# Patient Record
Sex: Female | Born: 1935
Health system: Southern US, Community
[De-identification: ages and names within clinical notes are randomized; demographics above are authoritative.]

## PROBLEM LIST (undated history)

## (undated) DIAGNOSIS — Z8719 Personal history of other diseases of the digestive system: Secondary | ICD-10-CM

## (undated) DIAGNOSIS — E039 Hypothyroidism, unspecified: Secondary | ICD-10-CM

## (undated) DIAGNOSIS — I10 Essential (primary) hypertension: Secondary | ICD-10-CM

## (undated) DIAGNOSIS — K862 Cyst of pancreas: Secondary | ICD-10-CM

## (undated) DIAGNOSIS — K219 Gastro-esophageal reflux disease without esophagitis: Secondary | ICD-10-CM

## (undated) DIAGNOSIS — G4700 Insomnia, unspecified: Secondary | ICD-10-CM

## (undated) DIAGNOSIS — R5381 Other malaise: Secondary | ICD-10-CM

## (undated) DIAGNOSIS — F329 Major depressive disorder, single episode, unspecified: Secondary | ICD-10-CM

## (undated) DIAGNOSIS — R350 Frequency of micturition: Secondary | ICD-10-CM

## (undated) DIAGNOSIS — K2980 Duodenitis without bleeding: Secondary | ICD-10-CM

## (undated) DIAGNOSIS — F32A Depression, unspecified: Secondary | ICD-10-CM

## (undated) DIAGNOSIS — M1990 Unspecified osteoarthritis, unspecified site: Secondary | ICD-10-CM

## (undated) DIAGNOSIS — J189 Pneumonia, unspecified organism: Secondary | ICD-10-CM

## (undated) DIAGNOSIS — E43 Unspecified severe protein-calorie malnutrition: Secondary | ICD-10-CM

## (undated) DIAGNOSIS — K863 Pseudocyst of pancreas: Secondary | ICD-10-CM

## (undated) DIAGNOSIS — G2581 Restless legs syndrome: Secondary | ICD-10-CM

## (undated) DIAGNOSIS — Z86711 Personal history of pulmonary embolism: Secondary | ICD-10-CM

## (undated) DIAGNOSIS — G4730 Sleep apnea, unspecified: Secondary | ICD-10-CM

## (undated) DIAGNOSIS — B029 Zoster without complications: Secondary | ICD-10-CM

## (undated) DIAGNOSIS — Q453 Other congenital malformations of pancreas and pancreatic duct: Secondary | ICD-10-CM

## (undated) DIAGNOSIS — E785 Hyperlipidemia, unspecified: Secondary | ICD-10-CM

## (undated) DIAGNOSIS — M353 Polymyalgia rheumatica: Secondary | ICD-10-CM

## (undated) DIAGNOSIS — F419 Anxiety disorder, unspecified: Secondary | ICD-10-CM

## (undated) DIAGNOSIS — K222 Esophageal obstruction: Secondary | ICD-10-CM

## (undated) DIAGNOSIS — R5383 Other fatigue: Secondary | ICD-10-CM

## (undated) DIAGNOSIS — R3915 Urgency of urination: Secondary | ICD-10-CM

## (undated) DIAGNOSIS — I82402 Acute embolism and thrombosis of unspecified deep veins of left lower extremity: Secondary | ICD-10-CM

## (undated) DIAGNOSIS — I82409 Acute embolism and thrombosis of unspecified deep veins of unspecified lower extremity: Secondary | ICD-10-CM

## (undated) DIAGNOSIS — N3281 Overactive bladder: Secondary | ICD-10-CM

## (undated) DIAGNOSIS — I4891 Unspecified atrial fibrillation: Secondary | ICD-10-CM

## (undated) DIAGNOSIS — O2230 Deep phlebothrombosis in pregnancy, unspecified trimester: Secondary | ICD-10-CM

## (undated) DIAGNOSIS — K449 Diaphragmatic hernia without obstruction or gangrene: Secondary | ICD-10-CM

## (undated) DIAGNOSIS — D649 Anemia, unspecified: Secondary | ICD-10-CM

## (undated) DIAGNOSIS — K5090 Crohn's disease, unspecified, without complications: Secondary | ICD-10-CM

## (undated) DIAGNOSIS — K2900 Acute gastritis without bleeding: Secondary | ICD-10-CM

## (undated) DIAGNOSIS — G43909 Migraine, unspecified, not intractable, without status migrainosus: Secondary | ICD-10-CM

## (undated) DIAGNOSIS — E119 Type 2 diabetes mellitus without complications: Secondary | ICD-10-CM

## (undated) DIAGNOSIS — I499 Cardiac arrhythmia, unspecified: Secondary | ICD-10-CM

## (undated) DIAGNOSIS — K5730 Diverticulosis of large intestine without perforation or abscess without bleeding: Secondary | ICD-10-CM

## (undated) HISTORY — DX: Gastro-esophageal reflux disease without esophagitis: K21.9

## (undated) HISTORY — PX: TONSILLECTOMY: SUR1361

## (undated) HISTORY — DX: Cyst of pancreas: K86.2

## (undated) HISTORY — DX: Deep phlebothrombosis in pregnancy, unspecified trimester: O22.30

## (undated) HISTORY — DX: Pneumonia, unspecified organism: J18.9

## (undated) HISTORY — DX: Zoster without complications: B02.9

## (undated) HISTORY — DX: Unspecified osteoarthritis, unspecified site: M19.90

## (undated) HISTORY — DX: Duodenitis without bleeding: K29.80

## (undated) HISTORY — DX: Pseudocyst of pancreas: K86.3

## (undated) HISTORY — DX: Personal history of pulmonary embolism: Z86.711

## (undated) HISTORY — DX: Diverticulosis of large intestine without perforation or abscess without bleeding: K57.30

## (undated) HISTORY — DX: Insomnia, unspecified: G47.00

## (undated) HISTORY — DX: Anemia, unspecified: D64.9

## (undated) HISTORY — PX: CATARACT EXTRACTION, BILATERAL: SHX1313

## (undated) HISTORY — DX: Diaphragmatic hernia without obstruction or gangrene: K44.9

## (undated) HISTORY — DX: Crohn's disease, unspecified, without complications: K50.90

## (undated) HISTORY — DX: Sleep apnea, unspecified: G47.30

## (undated) HISTORY — PX: BACK SURGERY: SHX140

## (undated) HISTORY — PX: ROTATOR CUFF REPAIR: SHX139

## (undated) HISTORY — DX: Esophageal obstruction: K22.2

## (undated) HISTORY — DX: Other fatigue: R53.81

## (undated) HISTORY — DX: Essential (primary) hypertension: I10

## (undated) HISTORY — DX: Polymyalgia rheumatica: M35.3

## (undated) HISTORY — DX: Other fatigue: R53.83

## (undated) HISTORY — DX: Personal history of other diseases of the digestive system: Z87.19

## (undated) HISTORY — DX: Hyperlipidemia, unspecified: E78.5

## (undated) HISTORY — DX: Restless legs syndrome: G25.81

## (undated) HISTORY — DX: Acute gastritis without bleeding: K29.00

---
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
## 1978-12-08 HISTORY — PX: REDUCTION MAMMAPLASTY: SUR839

## 1998-09-10 ENCOUNTER — Ambulatory Visit (HOSPITAL_COMMUNITY): Admission: RE | Admit: 1998-09-10 | Discharge: 1998-09-10 | Payer: Self-pay | Admitting: Endocrinology

## 1999-05-29 ENCOUNTER — Encounter: Payer: Self-pay | Admitting: Internal Medicine

## 1999-05-29 ENCOUNTER — Inpatient Hospital Stay (HOSPITAL_COMMUNITY): Admission: EM | Admit: 1999-05-29 | Discharge: 1999-06-03 | Payer: Self-pay | Admitting: Internal Medicine

## 1999-09-09 ENCOUNTER — Ambulatory Visit (HOSPITAL_COMMUNITY): Admission: RE | Admit: 1999-09-09 | Discharge: 1999-09-09 | Payer: Self-pay | Admitting: Internal Medicine

## 1999-09-09 ENCOUNTER — Encounter: Payer: Self-pay | Admitting: Internal Medicine

## 2000-02-24 ENCOUNTER — Encounter: Admission: RE | Admit: 2000-02-24 | Discharge: 2000-02-24 | Payer: Self-pay | Admitting: Internal Medicine

## 2000-02-24 ENCOUNTER — Encounter: Payer: Self-pay | Admitting: Internal Medicine

## 2000-03-19 ENCOUNTER — Encounter: Payer: Self-pay | Admitting: Orthopedic Surgery

## 2000-03-27 ENCOUNTER — Inpatient Hospital Stay (HOSPITAL_COMMUNITY): Admission: RE | Admit: 2000-03-27 | Discharge: 2000-03-30 | Payer: Self-pay | Admitting: Orthopedic Surgery

## 2000-07-28 ENCOUNTER — Other Ambulatory Visit: Admission: RE | Admit: 2000-07-28 | Discharge: 2000-07-28 | Payer: Self-pay | Admitting: Psychology

## 2000-08-20 ENCOUNTER — Encounter: Payer: Self-pay | Admitting: Endocrinology

## 2000-08-20 ENCOUNTER — Inpatient Hospital Stay (HOSPITAL_COMMUNITY): Admission: EM | Admit: 2000-08-20 | Discharge: 2000-08-23 | Payer: Self-pay | Admitting: Emergency Medicine

## 2000-10-07 ENCOUNTER — Ambulatory Visit (HOSPITAL_COMMUNITY): Admission: RE | Admit: 2000-10-07 | Discharge: 2000-10-07 | Payer: Self-pay

## 2000-11-16 ENCOUNTER — Ambulatory Visit (HOSPITAL_BASED_OUTPATIENT_CLINIC_OR_DEPARTMENT_OTHER): Admission: RE | Admit: 2000-11-16 | Discharge: 2000-11-16 | Payer: Self-pay | Admitting: Pulmonary Disease

## 2001-09-21 ENCOUNTER — Other Ambulatory Visit: Admission: RE | Admit: 2001-09-21 | Discharge: 2001-09-21 | Payer: Self-pay | Admitting: *Deleted

## 2001-10-08 ENCOUNTER — Ambulatory Visit (HOSPITAL_COMMUNITY): Admission: RE | Admit: 2001-10-08 | Discharge: 2001-10-08 | Payer: Self-pay | Admitting: *Deleted

## 2001-10-25 ENCOUNTER — Ambulatory Visit: Admission: RE | Admit: 2001-10-25 | Discharge: 2001-10-25 | Payer: Self-pay | Admitting: *Deleted

## 2001-12-13 ENCOUNTER — Encounter: Admission: RE | Admit: 2001-12-13 | Discharge: 2001-12-13 | Payer: Self-pay | Admitting: *Deleted

## 2001-12-27 ENCOUNTER — Encounter: Admission: RE | Admit: 2001-12-27 | Discharge: 2001-12-27 | Payer: Self-pay | Admitting: *Deleted

## 2002-01-27 ENCOUNTER — Ambulatory Visit (HOSPITAL_COMMUNITY): Admission: RE | Admit: 2002-01-27 | Discharge: 2002-01-27 | Payer: Self-pay | Admitting: *Deleted

## 2002-10-17 ENCOUNTER — Ambulatory Visit (HOSPITAL_COMMUNITY): Admission: RE | Admit: 2002-10-17 | Discharge: 2002-10-17 | Payer: Self-pay | Admitting: *Deleted

## 2002-11-30 ENCOUNTER — Encounter: Payer: Self-pay | Admitting: Internal Medicine

## 2002-11-30 ENCOUNTER — Ambulatory Visit (HOSPITAL_COMMUNITY): Admission: RE | Admit: 2002-11-30 | Discharge: 2002-11-30 | Payer: Self-pay | Admitting: Internal Medicine

## 2003-01-09 ENCOUNTER — Encounter: Payer: Self-pay | Admitting: Internal Medicine

## 2003-01-09 ENCOUNTER — Ambulatory Visit (HOSPITAL_COMMUNITY): Admission: RE | Admit: 2003-01-09 | Discharge: 2003-01-09 | Payer: Self-pay | Admitting: Internal Medicine

## 2003-01-17 ENCOUNTER — Ambulatory Visit (HOSPITAL_COMMUNITY): Admission: RE | Admit: 2003-01-17 | Discharge: 2003-01-17 | Payer: Self-pay | Admitting: Internal Medicine

## 2003-10-11 ENCOUNTER — Encounter: Admission: RE | Admit: 2003-10-11 | Discharge: 2003-10-11 | Payer: Self-pay | Admitting: Internal Medicine

## 2003-10-23 ENCOUNTER — Encounter: Payer: Self-pay | Admitting: Gastroenterology

## 2004-06-04 ENCOUNTER — Emergency Department (HOSPITAL_COMMUNITY): Admission: EM | Admit: 2004-06-04 | Discharge: 2004-06-04 | Payer: Self-pay | Admitting: Emergency Medicine

## 2004-10-25 ENCOUNTER — Ambulatory Visit: Payer: Self-pay | Admitting: Pulmonary Disease

## 2004-10-29 ENCOUNTER — Ambulatory Visit (HOSPITAL_COMMUNITY): Admission: RE | Admit: 2004-10-29 | Discharge: 2004-10-29 | Payer: Self-pay | Admitting: Pulmonary Disease

## 2004-11-04 ENCOUNTER — Ambulatory Visit: Payer: Self-pay | Admitting: Pulmonary Disease

## 2004-12-08 HISTORY — PX: SHOULDER OPEN ROTATOR CUFF REPAIR: SHX2407

## 2005-01-30 ENCOUNTER — Inpatient Hospital Stay (HOSPITAL_COMMUNITY): Admission: RE | Admit: 2005-01-30 | Discharge: 2005-01-31 | Payer: Self-pay | Admitting: Orthopedic Surgery

## 2005-02-24 ENCOUNTER — Ambulatory Visit: Payer: Self-pay | Admitting: Internal Medicine

## 2005-02-27 ENCOUNTER — Ambulatory Visit: Payer: Self-pay | Admitting: Internal Medicine

## 2005-02-28 ENCOUNTER — Inpatient Hospital Stay (HOSPITAL_COMMUNITY): Admission: EM | Admit: 2005-02-28 | Discharge: 2005-03-01 | Payer: Self-pay | Admitting: Emergency Medicine

## 2005-04-14 ENCOUNTER — Ambulatory Visit: Payer: Self-pay | Admitting: Internal Medicine

## 2005-04-17 ENCOUNTER — Ambulatory Visit: Payer: Self-pay | Admitting: Pulmonary Disease

## 2005-04-17 ENCOUNTER — Ambulatory Visit: Payer: Self-pay | Admitting: Internal Medicine

## 2005-04-24 ENCOUNTER — Ambulatory Visit (HOSPITAL_COMMUNITY): Admission: RE | Admit: 2005-04-24 | Discharge: 2005-04-24 | Payer: Self-pay | Admitting: Internal Medicine

## 2005-05-20 ENCOUNTER — Ambulatory Visit: Payer: Self-pay | Admitting: Internal Medicine

## 2005-06-16 ENCOUNTER — Ambulatory Visit (HOSPITAL_COMMUNITY): Admission: RE | Admit: 2005-06-16 | Discharge: 2005-06-16 | Payer: Self-pay | Admitting: Internal Medicine

## 2005-06-16 ENCOUNTER — Ambulatory Visit: Payer: Self-pay | Admitting: Internal Medicine

## 2005-06-16 ENCOUNTER — Observation Stay (HOSPITAL_COMMUNITY): Admission: EM | Admit: 2005-06-16 | Discharge: 2005-06-17 | Payer: Self-pay | Admitting: Internal Medicine

## 2005-07-09 ENCOUNTER — Ambulatory Visit: Payer: Self-pay | Admitting: Internal Medicine

## 2005-10-16 ENCOUNTER — Emergency Department (HOSPITAL_COMMUNITY): Admission: EM | Admit: 2005-10-16 | Discharge: 2005-10-16 | Payer: Self-pay | Admitting: Emergency Medicine

## 2005-10-22 ENCOUNTER — Encounter: Admission: RE | Admit: 2005-10-22 | Discharge: 2005-10-22 | Payer: Self-pay | Admitting: Internal Medicine

## 2006-03-05 ENCOUNTER — Ambulatory Visit: Payer: Self-pay | Admitting: Internal Medicine

## 2006-03-06 ENCOUNTER — Emergency Department (HOSPITAL_COMMUNITY): Admission: EM | Admit: 2006-03-06 | Discharge: 2006-03-06 | Payer: Self-pay | Admitting: Emergency Medicine

## 2006-03-12 ENCOUNTER — Ambulatory Visit (HOSPITAL_COMMUNITY): Admission: RE | Admit: 2006-03-12 | Discharge: 2006-03-12 | Payer: Self-pay | Admitting: General Surgery

## 2006-03-16 ENCOUNTER — Ambulatory Visit: Payer: Self-pay | Admitting: Internal Medicine

## 2006-03-31 ENCOUNTER — Ambulatory Visit (HOSPITAL_COMMUNITY): Admission: RE | Admit: 2006-03-31 | Discharge: 2006-03-31 | Payer: Self-pay | Admitting: Internal Medicine

## 2006-04-08 ENCOUNTER — Ambulatory Visit: Payer: Self-pay | Admitting: Internal Medicine

## 2006-04-10 ENCOUNTER — Ambulatory Visit (HOSPITAL_COMMUNITY): Admission: RE | Admit: 2006-04-10 | Discharge: 2006-04-10 | Payer: Self-pay | Admitting: Internal Medicine

## 2006-04-21 ENCOUNTER — Ambulatory Visit: Payer: Self-pay | Admitting: Internal Medicine

## 2006-04-30 ENCOUNTER — Encounter (INDEPENDENT_AMBULATORY_CARE_PROVIDER_SITE_OTHER): Payer: Self-pay | Admitting: Specialist

## 2006-04-30 ENCOUNTER — Ambulatory Visit (HOSPITAL_COMMUNITY): Admission: RE | Admit: 2006-04-30 | Discharge: 2006-04-30 | Payer: Self-pay | Admitting: Gastroenterology

## 2006-04-30 ENCOUNTER — Encounter: Payer: Self-pay | Admitting: Gastroenterology

## 2006-05-06 ENCOUNTER — Ambulatory Visit: Payer: Self-pay | Admitting: Gastroenterology

## 2006-06-02 ENCOUNTER — Ambulatory Visit: Payer: Self-pay | Admitting: Internal Medicine

## 2006-07-09 ENCOUNTER — Ambulatory Visit: Payer: Self-pay | Admitting: Internal Medicine

## 2006-10-26 ENCOUNTER — Ambulatory Visit: Payer: Self-pay | Admitting: Internal Medicine

## 2006-11-13 ENCOUNTER — Ambulatory Visit: Payer: Self-pay | Admitting: Pulmonary Disease

## 2007-01-08 ENCOUNTER — Ambulatory Visit (HOSPITAL_BASED_OUTPATIENT_CLINIC_OR_DEPARTMENT_OTHER): Admission: RE | Admit: 2007-01-08 | Discharge: 2007-01-08 | Payer: Self-pay | Admitting: Pulmonary Disease

## 2007-01-12 ENCOUNTER — Ambulatory Visit: Payer: Self-pay | Admitting: Pulmonary Disease

## 2007-02-05 ENCOUNTER — Ambulatory Visit: Payer: Self-pay | Admitting: Pulmonary Disease

## 2007-02-05 LAB — CONVERTED CEMR LAB
Basophils Absolute: 0 10*3/uL (ref 0.0–0.1)
CO2: 28 meq/L (ref 19–32)
Chloride: 101 meq/L (ref 96–112)
Creatinine, Ser: 0.9 mg/dL (ref 0.4–1.2)
GFR calc Af Amer: 77 mL/min
Glucose, Bld: 111 mg/dL — ABNORMAL HIGH (ref 70–99)
HCT: 34.3 % — ABNORMAL LOW (ref 36.0–46.0)
Hemoglobin: 11.6 g/dL — ABNORMAL LOW (ref 12.0–15.0)
MCHC: 33.9 g/dL (ref 30.0–36.0)
Monocytes Absolute: 0.3 10*3/uL (ref 0.2–0.7)
Neutrophils Relative %: 42 % — ABNORMAL LOW (ref 43.0–77.0)
RDW: 12.6 % (ref 11.5–14.6)
Saturation Ratios: 22.8 % (ref 20.0–50.0)
Sodium: 136 meq/L (ref 135–145)

## 2007-06-03 ENCOUNTER — Emergency Department (HOSPITAL_COMMUNITY): Admission: EM | Admit: 2007-06-03 | Discharge: 2007-06-03 | Payer: Self-pay | Admitting: Emergency Medicine

## 2007-07-12 ENCOUNTER — Ambulatory Visit: Payer: Self-pay | Admitting: Internal Medicine

## 2007-07-12 LAB — CONVERTED CEMR LAB
Basophils Relative: 0.4 % (ref 0.0–1.0)
Bilirubin, Direct: 0.1 mg/dL (ref 0.0–0.3)
CO2: 30 meq/L (ref 19–32)
Creatinine, Ser: 0.7 mg/dL (ref 0.4–1.2)
Eosinophils Relative: 0.9 % (ref 0.0–5.0)
GFR calc Af Amer: 102 mL/min
Glucose, Bld: 122 mg/dL — ABNORMAL HIGH (ref 70–99)
HCT: 36.2 % (ref 36.0–46.0)
Hemoglobin: 12.6 g/dL (ref 12.0–15.0)
Lipase: 17 units/L (ref 11.0–59.0)
Lymphocytes Relative: 31.9 % (ref 12.0–46.0)
Monocytes Absolute: 0.5 10*3/uL (ref 0.2–0.7)
Neutro Abs: 4.8 10*3/uL (ref 1.4–7.7)
Neutrophils Relative %: 60.3 % (ref 43.0–77.0)
Potassium: 4.8 meq/L (ref 3.5–5.1)
Total Bilirubin: 0.6 mg/dL (ref 0.3–1.2)
Total Protein: 6.6 g/dL (ref 6.0–8.3)
WBC: 8 10*3/uL (ref 4.5–10.5)

## 2007-07-14 ENCOUNTER — Ambulatory Visit: Payer: Self-pay | Admitting: Cardiology

## 2007-08-02 ENCOUNTER — Ambulatory Visit: Payer: Self-pay | Admitting: Internal Medicine

## 2008-03-09 DIAGNOSIS — G4730 Sleep apnea, unspecified: Secondary | ICD-10-CM | POA: Insufficient documentation

## 2008-03-09 DIAGNOSIS — E785 Hyperlipidemia, unspecified: Secondary | ICD-10-CM | POA: Insufficient documentation

## 2008-03-09 DIAGNOSIS — K862 Cyst of pancreas: Secondary | ICD-10-CM | POA: Insufficient documentation

## 2008-03-09 DIAGNOSIS — R5381 Other malaise: Secondary | ICD-10-CM | POA: Insufficient documentation

## 2008-03-09 DIAGNOSIS — K222 Esophageal obstruction: Secondary | ICD-10-CM | POA: Insufficient documentation

## 2008-03-09 DIAGNOSIS — K449 Diaphragmatic hernia without obstruction or gangrene: Secondary | ICD-10-CM | POA: Insufficient documentation

## 2008-03-09 DIAGNOSIS — R5383 Other fatigue: Secondary | ICD-10-CM

## 2008-03-09 DIAGNOSIS — K2980 Duodenitis without bleeding: Secondary | ICD-10-CM | POA: Insufficient documentation

## 2008-03-09 DIAGNOSIS — K219 Gastro-esophageal reflux disease without esophagitis: Secondary | ICD-10-CM | POA: Insufficient documentation

## 2008-03-09 DIAGNOSIS — I10 Essential (primary) hypertension: Secondary | ICD-10-CM | POA: Insufficient documentation

## 2008-03-09 DIAGNOSIS — G2581 Restless legs syndrome: Secondary | ICD-10-CM | POA: Insufficient documentation

## 2008-03-09 DIAGNOSIS — K2900 Acute gastritis without bleeding: Secondary | ICD-10-CM | POA: Insufficient documentation

## 2008-03-09 DIAGNOSIS — M353 Polymyalgia rheumatica: Secondary | ICD-10-CM | POA: Insufficient documentation

## 2008-03-09 DIAGNOSIS — K863 Pseudocyst of pancreas: Secondary | ICD-10-CM

## 2008-03-09 DIAGNOSIS — K5730 Diverticulosis of large intestine without perforation or abscess without bleeding: Secondary | ICD-10-CM | POA: Insufficient documentation

## 2008-07-31 ENCOUNTER — Emergency Department (HOSPITAL_COMMUNITY): Admission: EM | Admit: 2008-07-31 | Discharge: 2008-07-31 | Payer: Self-pay | Admitting: Emergency Medicine

## 2008-08-07 ENCOUNTER — Encounter: Admission: RE | Admit: 2008-08-07 | Discharge: 2008-08-07 | Payer: Self-pay | Admitting: Otolaryngology

## 2008-08-08 HISTORY — PX: CLOSED REDUCTION NASAL FRACTURE: SUR256

## 2008-08-10 ENCOUNTER — Ambulatory Visit (HOSPITAL_BASED_OUTPATIENT_CLINIC_OR_DEPARTMENT_OTHER): Admission: RE | Admit: 2008-08-10 | Discharge: 2008-08-10 | Payer: Self-pay | Admitting: Otolaryngology

## 2008-08-23 ENCOUNTER — Telehealth: Payer: Self-pay | Admitting: Internal Medicine

## 2008-09-06 ENCOUNTER — Ambulatory Visit: Payer: Self-pay | Admitting: Internal Medicine

## 2008-09-06 DIAGNOSIS — R197 Diarrhea, unspecified: Secondary | ICD-10-CM | POA: Insufficient documentation

## 2008-09-07 LAB — CONVERTED CEMR LAB
ALT: 14 units/L (ref 0–35)
AST: 27 units/L (ref 0–37)
Albumin: 3.9 g/dL (ref 3.5–5.2)
Alkaline Phosphatase: 112 units/L (ref 39–117)
BUN: 25 mg/dL — ABNORMAL HIGH (ref 6–23)
Calcium: 9 mg/dL (ref 8.4–10.5)
Chloride: 101 meq/L (ref 96–112)
Eosinophils Absolute: 0.2 10*3/uL (ref 0.0–0.7)
Eosinophils Relative: 2.9 % (ref 0.0–5.0)
MCV: 89.7 fL (ref 78.0–100.0)
Neutrophils Relative %: 51.5 % (ref 43.0–77.0)
Platelets: 222 10*3/uL (ref 150–400)
Potassium: 3.8 meq/L (ref 3.5–5.1)
RDW: 12.6 % (ref 11.5–14.6)
Sed Rate: 28 mm/hr — ABNORMAL HIGH (ref 0–22)
Sodium: 141 meq/L (ref 135–145)
Total Protein: 6.5 g/dL (ref 6.0–8.3)
WBC: 6.3 10*3/uL (ref 4.5–10.5)

## 2008-09-18 ENCOUNTER — Telehealth: Payer: Self-pay | Admitting: Internal Medicine

## 2008-09-21 ENCOUNTER — Telehealth: Payer: Self-pay | Admitting: Internal Medicine

## 2008-10-06 ENCOUNTER — Encounter: Payer: Self-pay | Admitting: Internal Medicine

## 2008-10-12 ENCOUNTER — Telehealth: Payer: Self-pay | Admitting: Internal Medicine

## 2008-10-16 ENCOUNTER — Encounter: Payer: Self-pay | Admitting: Internal Medicine

## 2008-10-17 ENCOUNTER — Ambulatory Visit: Payer: Self-pay | Admitting: Internal Medicine

## 2008-10-18 ENCOUNTER — Ambulatory Visit: Payer: Self-pay | Admitting: Internal Medicine

## 2008-11-22 ENCOUNTER — Emergency Department (HOSPITAL_COMMUNITY): Admission: EM | Admit: 2008-11-22 | Discharge: 2008-11-22 | Payer: Self-pay | Admitting: Emergency Medicine

## 2009-01-08 HISTORY — PX: POSTERIOR LUMBAR FUSION: SHX6036

## 2009-01-22 ENCOUNTER — Inpatient Hospital Stay (HOSPITAL_COMMUNITY): Admission: RE | Admit: 2009-01-22 | Discharge: 2009-02-02 | Payer: Self-pay | Admitting: Neurosurgery

## 2009-02-01 ENCOUNTER — Ambulatory Visit: Payer: Self-pay | Admitting: Physical Medicine & Rehabilitation

## 2009-02-02 ENCOUNTER — Inpatient Hospital Stay (HOSPITAL_COMMUNITY)
Admission: RE | Admit: 2009-02-02 | Discharge: 2009-02-09 | Payer: Self-pay | Admitting: Physical Medicine & Rehabilitation

## 2009-02-06 ENCOUNTER — Ambulatory Visit: Payer: Self-pay | Admitting: Vascular Surgery

## 2009-02-06 ENCOUNTER — Encounter: Payer: Self-pay | Admitting: Physical Medicine & Rehabilitation

## 2009-10-29 ENCOUNTER — Encounter: Admission: RE | Admit: 2009-10-29 | Discharge: 2009-10-29 | Payer: Self-pay | Admitting: Neurosurgery

## 2009-11-19 ENCOUNTER — Encounter: Admission: RE | Admit: 2009-11-19 | Discharge: 2009-11-19 | Payer: Self-pay | Admitting: Neurosurgery

## 2009-12-10 ENCOUNTER — Encounter: Admission: RE | Admit: 2009-12-10 | Discharge: 2010-01-28 | Payer: Self-pay | Admitting: Internal Medicine

## 2010-01-07 ENCOUNTER — Inpatient Hospital Stay (HOSPITAL_COMMUNITY): Admission: AD | Admit: 2010-01-07 | Discharge: 2010-01-15 | Payer: Self-pay | Admitting: Internal Medicine

## 2010-06-30 ENCOUNTER — Emergency Department (HOSPITAL_COMMUNITY): Admission: EM | Admit: 2010-06-30 | Discharge: 2010-06-30 | Payer: Self-pay | Admitting: Emergency Medicine

## 2010-11-14 ENCOUNTER — Emergency Department (HOSPITAL_COMMUNITY): Admission: EM | Admit: 2010-11-14 | Discharge: 2010-01-06 | Payer: Self-pay | Admitting: Emergency Medicine

## 2010-12-28 ENCOUNTER — Encounter: Payer: Self-pay | Admitting: Internal Medicine

## 2010-12-28 ENCOUNTER — Encounter: Payer: Self-pay | Admitting: Pulmonary Disease

## 2010-12-29 ENCOUNTER — Encounter: Payer: Self-pay | Admitting: Internal Medicine

## 2010-12-29 ENCOUNTER — Encounter: Payer: Self-pay | Admitting: Gastroenterology

## 2011-01-10 ENCOUNTER — Inpatient Hospital Stay (HOSPITAL_COMMUNITY)
Admission: AD | Admit: 2011-01-10 | Discharge: 2011-01-13 | DRG: 203 | Disposition: A | Discharge status: Skilled Nursing Facility | Payer: Medicare Other | Source: Ambulatory Visit | Attending: Internal Medicine | Admitting: Internal Medicine

## 2011-01-10 ENCOUNTER — Inpatient Hospital Stay (HOSPITAL_COMMUNITY): Payer: Medicare Other

## 2011-01-10 DIAGNOSIS — K5730 Diverticulosis of large intestine without perforation or abscess without bleeding: Secondary | ICD-10-CM | POA: Diagnosis present

## 2011-01-10 DIAGNOSIS — E559 Vitamin D deficiency, unspecified: Secondary | ICD-10-CM | POA: Diagnosis present

## 2011-01-10 DIAGNOSIS — J40 Bronchitis, not specified as acute or chronic: Principal | ICD-10-CM | POA: Diagnosis present

## 2011-01-10 DIAGNOSIS — R262 Difficulty in walking, not elsewhere classified: Secondary | ICD-10-CM | POA: Diagnosis present

## 2011-01-10 DIAGNOSIS — M1990 Unspecified osteoarthritis, unspecified site: Secondary | ICD-10-CM | POA: Diagnosis present

## 2011-01-10 DIAGNOSIS — F329 Major depressive disorder, single episode, unspecified: Secondary | ICD-10-CM | POA: Diagnosis present

## 2011-01-10 DIAGNOSIS — G4733 Obstructive sleep apnea (adult) (pediatric): Secondary | ICD-10-CM | POA: Diagnosis present

## 2011-01-10 DIAGNOSIS — E785 Hyperlipidemia, unspecified: Secondary | ICD-10-CM | POA: Diagnosis present

## 2011-01-10 DIAGNOSIS — R627 Adult failure to thrive: Secondary | ICD-10-CM | POA: Diagnosis present

## 2011-01-10 DIAGNOSIS — R32 Unspecified urinary incontinence: Secondary | ICD-10-CM | POA: Diagnosis present

## 2011-01-10 DIAGNOSIS — G4700 Insomnia, unspecified: Secondary | ICD-10-CM | POA: Diagnosis present

## 2011-01-10 DIAGNOSIS — F3289 Other specified depressive episodes: Secondary | ICD-10-CM | POA: Diagnosis present

## 2011-01-10 DIAGNOSIS — Z79899 Other long term (current) drug therapy: Secondary | ICD-10-CM

## 2011-01-10 DIAGNOSIS — G2581 Restless legs syndrome: Secondary | ICD-10-CM | POA: Diagnosis present

## 2011-01-10 DIAGNOSIS — R5381 Other malaise: Secondary | ICD-10-CM | POA: Diagnosis present

## 2011-01-10 DIAGNOSIS — K219 Gastro-esophageal reflux disease without esophagitis: Secondary | ICD-10-CM | POA: Diagnosis present

## 2011-01-10 DIAGNOSIS — J3489 Other specified disorders of nose and nasal sinuses: Secondary | ICD-10-CM | POA: Diagnosis present

## 2011-01-10 DIAGNOSIS — E119 Type 2 diabetes mellitus without complications: Secondary | ICD-10-CM | POA: Diagnosis present

## 2011-01-10 DIAGNOSIS — Z87440 Personal history of urinary (tract) infections: Secondary | ICD-10-CM

## 2011-01-10 LAB — COMPREHENSIVE METABOLIC PANEL
Albumin: 3.7 g/dL (ref 3.5–5.2)
Alkaline Phosphatase: 91 U/L (ref 39–117)
BUN: 26 mg/dL — ABNORMAL HIGH (ref 6–23)
CO2: 26 mEq/L (ref 19–32)
Chloride: 101 mEq/L (ref 96–112)
Creatinine, Ser: 1.43 mg/dL — ABNORMAL HIGH (ref 0.4–1.2)
GFR calc non Af Amer: 35 mL/min — ABNORMAL LOW (ref >60)
Potassium: 4.3 mEq/L (ref 3.5–5.1)
Total Bilirubin: 0.8 mg/dL (ref 0.3–1.2)

## 2011-01-10 LAB — DIFFERENTIAL
Eosinophils Absolute: 0.1 10*3/uL (ref 0.0–0.7)
Eosinophils Relative: 1 % (ref 0–5)
Lymphocytes Relative: 33 % (ref 12–46)
Lymphs Abs: 2.7 10*3/uL (ref 0.7–4.0)
Monocytes Absolute: 0.6 10*3/uL (ref 0.1–1.0)
Monocytes Relative: 8 % (ref 3–12)

## 2011-01-10 LAB — CBC
HCT: 40.2 % (ref 36.0–46.0)
MCH: 30.4 pg (ref 26.0–34.0)
MCHC: 34.6 g/dL (ref 30.0–36.0)
MCV: 88 fL (ref 78.0–100.0)
Platelets: 265 10*3/uL (ref 150–400)
RDW: 13.7 % (ref 11.5–15.5)

## 2011-01-10 LAB — CK TOTAL AND CKMB (NOT AT ARMC): CK, MB: 2.3 ng/mL (ref 0.3–4.0)

## 2011-01-11 ENCOUNTER — Inpatient Hospital Stay (HOSPITAL_COMMUNITY): Payer: Medicare Other

## 2011-01-11 LAB — URINALYSIS, ROUTINE W REFLEX MICROSCOPIC
Hgb urine dipstick: NEGATIVE
Nitrite: NEGATIVE
Protein, ur: NEGATIVE mg/dL
Urobilinogen, UA: 0.2 mg/dL (ref 0.0–1.0)

## 2011-01-11 LAB — URINE MICROSCOPIC-ADD ON

## 2011-01-11 LAB — GLUCOSE, CAPILLARY
Glucose-Capillary: 113 mg/dL — ABNORMAL HIGH (ref 70–99)
Glucose-Capillary: 86 mg/dL (ref 70–99)
Glucose-Capillary: 157 mg/dL — ABNORMAL HIGH (ref 70–99)

## 2011-01-11 LAB — TSH: TSH: 5.936 u[IU]/mL — ABNORMAL HIGH (ref 0.350–4.500)

## 2011-01-12 LAB — CBC
MCH: 29.6 pg (ref 26.0–34.0)
MCHC: 33.2 g/dL (ref 30.0–36.0)
MCV: 88.9 fL (ref 78.0–100.0)
Platelets: 199 10*3/uL (ref 150–400)
RBC: 3.79 MIL/uL — ABNORMAL LOW (ref 3.87–5.11)

## 2011-01-12 LAB — BASIC METABOLIC PANEL
BUN: 20 mg/dL (ref 6–23)
CO2: 25 mEq/L (ref 19–32)
Calcium: 9 mg/dL (ref 8.4–10.5)
Chloride: 109 mEq/L (ref 96–112)
Creatinine, Ser: 1.09 mg/dL (ref 0.4–1.2)
GFR calc Af Amer: 57 mL/min — ABNORMAL LOW (ref >60)

## 2011-01-12 LAB — SEDIMENTATION RATE: Sed Rate: 10 mm/hr (ref 0–22)

## 2011-01-12 LAB — GLUCOSE, CAPILLARY
Glucose-Capillary: 85 mg/dL (ref 70–99)
Glucose-Capillary: 101 mg/dL — ABNORMAL HIGH (ref 70–99)

## 2011-01-13 LAB — GLUCOSE, CAPILLARY

## 2011-01-13 LAB — URINE CULTURE: Special Requests: NEGATIVE

## 2011-01-22 NOTE — Discharge Summary (Signed)
[name], Church NO.:  [id number]  MEDICAL RECORD NO.:  [id number]           PATIENT TYPE:  I  LOCATION:  0258                         FACILITY:  [facility]  PHYSICIAN:  Domenic Polite, MD     DATE OF BIRTH:  [birth year]  DATE OF ADMISSION:  01/10/2011 DATE OF DISCHARGE:                         DISCHARGE SUMMARY-REFERRING   PRIMARY CARE PHYSICIAN:  Benito Mccreedy, M.D.  DISCHARGE DIAGNOSES: 1. Recent bronchitis. 2. Cough secondary to recent bronchitis with question of the component     of post nasal drip. 3. Adult failure to thrive. 4. Generalized weakness. 5. Type 2 diabetes. 6. History of restless legs syndrome. 7. Chronic osteoarthritis. 8. History of vitamin D deficiency. 9. History of insomnia. 10.Hypertension. 11.History of Crohn disease. 12.History of laminectomy for lumbar stenosis and decompression in     March 2010. 13.History of depression. 14.History of hiatal hernia. 15.History of chronic abdominal pain with extensive prior workup by     [facility]. 16.History of PMR. 17.Dyslipidemia.  DISCHARGE MEDICATIONS: 1. Tessalon Perles 200 mg p.o. b.i.d. 2. Avelox 400 mg p.o. daily for 4 days. 3. Lidoderm patch 5% transdermally daily. 4. Melatonin 5 mg p.o. at bedtime. 5. Metformin 500 mg p.o. b.i.d. 6. Remeron 15 mg p.o. at bedtime. 7. ReQuip XL 4 mg p.o. daily. 8. Toviaz 8 mg p.o. daily. 9. Vitamin D2 50,000 units 1 capsule p.o. biweekly.  DIAGNOSTIC INVESTIGATIONS:  Chest x-ray on February 3, hiatal hernia, no acute cardiopulmonary abnormality.  CT and MRI of the brain February 4 shows chronic microvascular ischemia, no acute infarct or mass, normal pituitary.  HOSPITAL COURSE: 1. Ms. Falconi is a pleasant 77 year old Caucasian female with history     of chronic medical problems presented to the hospital with     persistent cough.  She has completed 2 courses of antibiotics for     bronchitis and upper respiratory  infection and during the 2 weeks     were she had these symptoms, she has been extremely weak and run     down and was unable to care for herself and hence presented to the     emergency room.  On evaluation, she had a chest x-ray, was clear.     She denied having oxygen requirements, did not have evidence of     hypoxia, leukocytosis, etc.  She is pretty much treated with     Tessalon Perles and Avelox.  Today's IV is subsequently switched     over to p.o.  She will be treated for four more days with oral     antibiotics as some of it may have an allergic component to it and     hence she is also started on Claritin for this. 2. Adult failure to thrive, felt to be secondary to the age as well as     recent upper respiratory infections, bronchitis.  She had a CT scan     done which did not show any acute or acute process or chronic     inflammatory disease or malignancy.  She had a normal ESR, normal     CPK, as  well as normal TSH.  At this point, a Physical Therapy     evaluation is pending to determine the level of care that she would need going forward.  Rest of her medical problems remained stable.  Discharge condition is stable.  VITAL SIGNS:  Temperature is 97.4, pulse is 57, blood pressure 119/74, respirations 17, satting 96% on room air.  Discharge followup with primary physician Dr. Iona Beard Osei-Bonsu in 1 week of discharge.  At this point disposition is pending to probably ALF versus SNF.     Domenic Polite, MD     PJ/MEDQ  D:  01/13/2011  T:  01/13/2011  Job:  [id number]  cc:   Benito Mccreedy, M.D. Fax: [phone]  Electronically Signed by Domenic Polite  on 01/22/2011 02:13:39 PM

## 2011-01-29 NOTE — H&P (Signed)
[name], Church NO.:  [id number]  MEDICAL RECORD NO.:  [id number]           PATIENT TYPE:  I  LOCATION:  9774                         FACILITY:  [facility]  PHYSICIAN:  [name] Repress, MD        DATE OF BIRTH:  09-05-1919  DATE OF ADMISSION:  01/10/2011 DATE OF DISCHARGE:                             HISTORY & PHYSICAL   HISTORY OF PRESENT ILLNESS:  An 77 year old female with a history of recent cough and chest congestion.  Apparently started last week and then she felt better on Saturday after being treated with Avelox.  Then Sunday, her symptoms started to return.  She was having cough with white sputum and some sneezing and denies any fever, chills, chest pain, palpitations, shortness of breath, nausea, vomiting, diarrhea, bright red blood per rectum, or black stool.  The patient apparently starting on Tuesday developed some generalized weakness.  She felt like her legs would go out.  The patient however denies any back pain, numbness, tingling, incontinence.  The patient was seen in the office today by Dr. George Church and sent to the hospital for evaluation.  He was concerned about pneumonia. Lab studies are still pending.  The patient will be admitted for cough and weakness.  PAST MEDICAL HISTORY: 1. Diabetes type (8-10 years). 2. Difficulty walking. 3. Urinary incontinence. 4. History of UTI. 5. Restless legs. 6. Insomnia. 7. Depression. 8. GERD/hiatal hernia on upper GI series on March 31, 2006. 9. Sigmoid diverticulosis on CT scan abdomen and pelvis on December 20, 2009. 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 11.History of spinal stenosis. 12.Mild obstructive sleep apnea with polysomnogram on February 1,     20 08.  PAST SURGICAL HISTORY: 1. L5 hemilaminectomy 2. Cataract surgery. 3. Rotator cuff surgery on January 29, 2005.  Recurrent tear of the     rotator cuff tendon on the right, loosened multi tach ankle right    shoulder. 4. Lateral epicondylitis:  Repair of rotator cuff tendon right     shoulder by using tissue mend graft, double thickness 4x4 in size     removal multi tack anchor, open acromionectomy of the right     shoulder, injection of the right lateral epicondyle by Dr. Latanya Church.  On January 22, 2009,  L4 laminectomy for decompression of L4-L5, posterior lumbar interbody arthrodesis L4-L5, posterior lateral arthrodesis, L4-S1, pedicle screw placement L4-S1, hydroxyapatite screws were used along with PEEK raw by Dr. Ashok Church.  SOCIAL HISTORY:  The patient lives with a granddaughter.  She does not drink or smoke.  FAMILY HISTORY:  Mother died at age 28 of unknown causes.  Father died at age of 38 unknown causes.  Her daughter died of melanoma at age 15. She has one son with skin cancer and being treated, who is alive at age 7.  ALLERGIES:  CODEINE.  MEDICATIONS: 1. Avelox 400 mg p.o. daily. 2. Tussionex 12.5 mg/3.5 mg/5 mg per 5 mL, 10 mL p.o. q.4h.. 3. Percocet 5/325 one p.o. q.12h.. 4. Toviaz 8 mg p.o. daily. 5.  Melatonin 5 mg p.o. q.h.s. 6. Ropinirole 4 mg p.o. daily. 7. Metformin 500 mg p.o. b.i.d. 8. Vitamin D 50,000 international units one p.o. b.i.d. 9. Lidoderm 5% patch one topically daily p.r.n. pain. 10.Remeron 50 mg p.o. daily  REVIEW OF SYSTEMS:  The patient experiences right knee pain.  She has had two injection of what sounds like steroid recently into her knee. Review of systems otherwise negative for all 10 review of systems except for pertinent positives stated above.  PHYSICAL EXAMINATION:  VITAL SIGNS:  Temperature 98.4, pulse 86, blood pressure 119/67, pulse ox 99% on room air. HEENT:  Anicteric, EOMI, no nystagmus, pupils 1.5 mm, symmetric direct and consensual near reflexes intact.  Mucous membranes moist. NECK:  No JVD, no bruit, no thyromegaly, no adenopathy. HEART:  Regular rate and rhythm.  S1-S2.  No murmurs, gallops, or  rubs. LUNGS:  Clear to auscultation bilaterally. ABDOMEN:  Soft, nontender, nondistended.  Positive bowel sounds. EXTREMITIES:  No cyanosis, clubbing, or edema. SKIN:  No rashes. LYMPH NODES:  No adenopathy. NEURO:  Nonfocal, cranial nerves II-XII intact.  Reflexes 2+, symmetric, diffuse with downgoing toes bilaterally, motor strength 5/5 in all four extremities, pinprick intact.  LABS:  WBC 8.3, hemoglobin 15.9, platelet count 265,000.  Chest x-ray showed a hiatal hernia.  No acute cardiopulmonary abnormalities.  ASSESSMENT/PLAN: 1. Cough:  Possibly secondary to allergic rhinitis:  Claritin 10 mg     p.o. daily. 2. Weakness:  We are awaiting labs such as CMP, TSH, CPK, CK-MB,     troponin I, and urinalysis.  If those are all negative, would     consider adding a sed rate to rule out probably myalgia rheumatica     and further workup obviously.  We will however check a CT scan of     the brain to rule out any stroke as a cause her weakness but there     is no evidence for any clinical symptoms of stroke at this time. 3. Diabetes type 2:  Continue metformin.  Fingerstick blood sugars     a.c. and h.s.  NovoLog sensitive sliding scale. 4. History of urinary incontinence:  Continue Toviaz 8 mg p.o. daily. 5. Restless leg syndrome:  Continue ropinirole 4 mg daily. 6. Chronic arthritis/pain.  Lidoderm 5% patch topically daily as     needed. 7. Insomnia:  Remeron 50 mg p.o. q.h.s. p.r.n. insomnia. 8. Vitamin D deficiency:  Continue vitamin D 50,000 international     units one p.o. b.i.d.  This should be followed up by Dr. Iona Beard     Church. 9. Deep venous thrombosis prophylaxis.  SCDs.     [name] Repress, MD     JYK/MEDQ  D:  01/10/2011  T:  01/10/2011  Job:  [id number]  cc:   [name], M.D. Fax: [phone]  Electronically Signed by [name] Gravel MD on 01/29/2011 01:57:26 PM

## 2011-02-23 LAB — COMPREHENSIVE METABOLIC PANEL
ALT: 14 U/L (ref 0–35)
AST: 22 U/L (ref 0–37)
Albumin: 4 g/dL (ref 3.5–5.2)
Alkaline Phosphatase: 90 U/L (ref 39–117)
GFR calc Af Amer: >60 mL/min (ref >60)
Potassium: 3.8 mEq/L (ref 3.5–5.1)
Sodium: 139 mEq/L (ref 135–145)
Total Protein: 6.8 g/dL (ref 6.0–8.3)

## 2011-02-23 LAB — URINE CULTURE: Culture: NO GROWTH

## 2011-02-23 LAB — DIFFERENTIAL
Basophils Relative: 0 % (ref 0–1)
Eosinophils Relative: 1 % (ref 0–5)
Monocytes Absolute: 0.4 10*3/uL (ref 0.1–1.0)
Monocytes Relative: 3 % (ref 3–12)
Neutro Abs: 11.5 10*3/uL — ABNORMAL HIGH (ref 1.7–7.7)

## 2011-02-23 LAB — URINALYSIS, ROUTINE W REFLEX MICROSCOPIC
Bilirubin Urine: NEGATIVE
Glucose, UA: NEGATIVE mg/dL
Hgb urine dipstick: NEGATIVE
Protein, ur: NEGATIVE mg/dL

## 2011-02-23 LAB — CBC
Platelets: 264 10*3/uL (ref 150–400)
RDW: 13.8 % (ref 11.5–15.5)
WBC: 12.7 10*3/uL — ABNORMAL HIGH (ref 4.0–10.5)

## 2011-02-23 LAB — GLUCOSE, CAPILLARY: Glucose-Capillary: 100 mg/dL — ABNORMAL HIGH (ref 70–99)

## 2011-02-26 LAB — BASIC METABOLIC PANEL
BUN: 12 mg/dL (ref 6–23)
BUN: 13 mg/dL (ref 6–23)
BUN: 16 mg/dL (ref 6–23)
BUN: 18 mg/dL (ref 6–23)
BUN: 9 mg/dL (ref 6–23)
CO2: 22 mEq/L (ref 19–32)
CO2: 28 mEq/L (ref 19–32)
Calcium: 8 mg/dL — ABNORMAL LOW (ref 8.4–10.5)
Calcium: 8.5 mg/dL (ref 8.4–10.5)
Calcium: 8.8 mg/dL (ref 8.4–10.5)
Chloride: 102 mEq/L (ref 96–112)
Chloride: 109 mEq/L (ref 96–112)
Chloride: 112 mEq/L (ref 96–112)
Chloride: 113 mEq/L — ABNORMAL HIGH (ref 96–112)
Creatinine, Ser: 0.59 mg/dL (ref 0.4–1.2)
Creatinine, Ser: 0.75 mg/dL (ref 0.4–1.2)
Creatinine, Ser: 0.86 mg/dL (ref 0.4–1.2)
GFR calc Af Amer: >60 mL/min (ref >60)
GFR calc non Af Amer: >60 mL/min (ref >60)
GFR calc non Af Amer: >60 mL/min (ref >60)
GFR calc non Af Amer: >60 mL/min (ref >60)
Glucose, Bld: 146 mg/dL — ABNORMAL HIGH (ref 70–99)
Glucose, Bld: 154 mg/dL — ABNORMAL HIGH (ref 70–99)
Glucose, Bld: 93 mg/dL (ref 70–99)
Potassium: 3.2 mEq/L — ABNORMAL LOW (ref 3.5–5.1)
Potassium: 3.5 mEq/L (ref 3.5–5.1)
Potassium: 3.8 mEq/L (ref 3.5–5.1)
Sodium: 139 mEq/L (ref 135–145)
Sodium: 141 mEq/L (ref 135–145)
Sodium: 143 mEq/L (ref 135–145)

## 2011-02-26 LAB — GLUCOSE, CAPILLARY
Glucose-Capillary: 104 mg/dL — ABNORMAL HIGH (ref 70–99)
Glucose-Capillary: 116 mg/dL — ABNORMAL HIGH (ref 70–99)
Glucose-Capillary: 121 mg/dL — ABNORMAL HIGH (ref 70–99)
Glucose-Capillary: 126 mg/dL — ABNORMAL HIGH (ref 70–99)
Glucose-Capillary: 126 mg/dL — ABNORMAL HIGH (ref 70–99)
Glucose-Capillary: 133 mg/dL — ABNORMAL HIGH (ref 70–99)
Glucose-Capillary: 145 mg/dL — ABNORMAL HIGH (ref 70–99)
Glucose-Capillary: 151 mg/dL — ABNORMAL HIGH (ref 70–99)
Glucose-Capillary: 163 mg/dL — ABNORMAL HIGH (ref 70–99)
Glucose-Capillary: 169 mg/dL — ABNORMAL HIGH (ref 70–99)
Glucose-Capillary: 196 mg/dL — ABNORMAL HIGH (ref 70–99)
Glucose-Capillary: 198 mg/dL — ABNORMAL HIGH (ref 70–99)
Glucose-Capillary: 229 mg/dL — ABNORMAL HIGH (ref 70–99)
Glucose-Capillary: 85 mg/dL (ref 70–99)
Glucose-Capillary: 94 mg/dL (ref 70–99)

## 2011-02-26 LAB — ACTH STIMULATION, 3 TIME POINTS
Cortisol, 30 Min: 18.7 ug/dL (ref >20)
Cortisol, Base: 5.7 ug/dL

## 2011-02-26 LAB — CBC
HCT: 33.2 % — ABNORMAL LOW (ref 36.0–46.0)
HCT: 33.9 % — ABNORMAL LOW (ref 36.0–46.0)
Hemoglobin: 11.3 g/dL — ABNORMAL LOW (ref 12.0–15.0)
Hemoglobin: 11.4 g/dL — ABNORMAL LOW (ref 12.0–15.0)
Hemoglobin: 12.2 g/dL (ref 12.0–15.0)
MCHC: 33.8 g/dL (ref 30.0–36.0)
MCV: 88.9 fL (ref 78.0–100.0)
MCV: 89.7 fL (ref 78.0–100.0)
Platelets: 199 10*3/uL (ref 150–400)
Platelets: 204 10*3/uL (ref 150–400)
Platelets: 215 10*3/uL (ref 150–400)
RBC: 3.73 MIL/uL — ABNORMAL LOW (ref 3.87–5.11)
RBC: 4.08 MIL/uL (ref 3.87–5.11)
RDW: 13.9 % (ref 11.5–15.5)
RDW: 14.1 % (ref 11.5–15.5)
RDW: 14.4 % (ref 11.5–15.5)
WBC: 3.7 10*3/uL — ABNORMAL LOW (ref 4.0–10.5)
WBC: 5.2 10*3/uL (ref 4.0–10.5)
WBC: 7 10*3/uL (ref 4.0–10.5)
WBC: 7 10*3/uL (ref 4.0–10.5)

## 2011-02-26 LAB — DIFFERENTIAL
Basophils Absolute: 0 10*3/uL (ref 0.0–0.1)
Basophils Relative: 0 % (ref 0–1)
Eosinophils Absolute: 0.2 10*3/uL (ref 0.0–0.7)
Eosinophils Relative: 6 % — ABNORMAL HIGH (ref 0–5)
Lymphocytes Relative: 44 % (ref 12–46)
Monocytes Absolute: 0.3 10*3/uL (ref 0.1–1.0)

## 2011-02-26 LAB — LIPID PANEL
Cholesterol: 170 mg/dL (ref 0–200)
Triglycerides: 135 mg/dL (ref <150)

## 2011-02-26 LAB — IRON AND TIBC: UIBC: 155 ug/dL

## 2011-02-26 LAB — COMPREHENSIVE METABOLIC PANEL
ALT: 12 U/L (ref 0–35)
AST: 17 U/L (ref 0–37)
Alkaline Phosphatase: 82 U/L (ref 39–117)
CO2: 27 mEq/L (ref 19–32)
Chloride: 109 mEq/L (ref 96–112)
GFR calc Af Amer: >60 mL/min (ref >60)
GFR calc non Af Amer: >60 mL/min (ref >60)
Glucose, Bld: 99 mg/dL (ref 70–99)
Potassium: 4.1 mEq/L (ref 3.5–5.1)
Sodium: 141 mEq/L (ref 135–145)
Total Bilirubin: 0.6 mg/dL (ref 0.3–1.2)

## 2011-02-26 LAB — RETICULOCYTES
RBC.: 4.24 MIL/uL (ref 3.87–5.11)
Retic Count, Absolute: 42.4 10*3/uL (ref 19.0–186.0)
Retic Ct Pct: 1 % (ref 0.4–3.1)

## 2011-02-26 LAB — HEMOGLOBIN A1C
Hgb A1c MFr Bld: 6.8 % — ABNORMAL HIGH (ref 4.6–6.1)
Mean Plasma Glucose: 148 mg/dL

## 2011-02-26 LAB — MAGNESIUM: Magnesium: 1.9 mg/dL (ref 1.5–2.5)

## 2011-02-26 LAB — ACTH: C206 ACTH: 14 pg/mL (ref 10–46)

## 2011-02-26 LAB — CORTISOL: Cortisol, Plasma: 1.5 ug/dL

## 2011-02-26 LAB — PROTIME-INR: Prothrombin Time: 13.3 seconds (ref 11.6–15.2)

## 2011-03-20 LAB — CBC
HCT: 24.7 % — ABNORMAL LOW (ref 36.0–46.0)
HCT: 27.7 % — ABNORMAL LOW (ref 36.0–46.0)
Hemoglobin: 8.7 g/dL — ABNORMAL LOW (ref 12.0–15.0)
MCHC: 35.1 g/dL (ref 30.0–36.0)
MCV: 89.7 fL (ref 78.0–100.0)
Platelets: 504 10*3/uL — ABNORMAL HIGH (ref 150–400)
Platelets: 579 10*3/uL — ABNORMAL HIGH (ref 150–400)
RDW: 14.2 % (ref 11.5–15.5)
RDW: 14.3 % (ref 11.5–15.5)

## 2011-03-20 LAB — GLUCOSE, CAPILLARY
Glucose-Capillary: 100 mg/dL — ABNORMAL HIGH (ref 70–99)
Glucose-Capillary: 121 mg/dL — ABNORMAL HIGH (ref 70–99)
Glucose-Capillary: 128 mg/dL — ABNORMAL HIGH (ref 70–99)
Glucose-Capillary: 73 mg/dL (ref 70–99)
Glucose-Capillary: 95 mg/dL (ref 70–99)
Glucose-Capillary: 97 mg/dL (ref 70–99)
Glucose-Capillary: 98 mg/dL (ref 70–99)

## 2011-03-20 LAB — DIFFERENTIAL
Basophils Absolute: 0 10*3/uL (ref 0.0–0.1)
Eosinophils Absolute: 0.3 10*3/uL (ref 0.0–0.7)
Eosinophils Relative: 5 % (ref 0–5)
Lymphs Abs: 1.6 10*3/uL (ref 0.7–4.0)
Neutrophils Relative %: 61 % (ref 43–77)

## 2011-03-20 LAB — CROSSMATCH: ABO/RH(D): O POS

## 2011-03-20 LAB — COMPREHENSIVE METABOLIC PANEL
ALT: 11 U/L (ref 0–35)
Albumin: 2.6 g/dL — ABNORMAL LOW (ref 3.5–5.2)
BUN: 14 mg/dL (ref 6–23)
GFR calc non Af Amer: >60 mL/min (ref >60)
Potassium: 3.7 mEq/L (ref 3.5–5.1)
Total Bilirubin: 0.2 mg/dL — ABNORMAL LOW (ref 0.3–1.2)
Total Protein: 5.5 g/dL — ABNORMAL LOW (ref 6.0–8.3)

## 2011-03-25 LAB — GLUCOSE, CAPILLARY
Glucose-Capillary: 109 mg/dL — ABNORMAL HIGH (ref 70–99)
Glucose-Capillary: 95 mg/dL (ref 70–99)
Glucose-Capillary: 100 mg/dL — ABNORMAL HIGH (ref 70–99)
Glucose-Capillary: 105 mg/dL — ABNORMAL HIGH (ref 70–99)
Glucose-Capillary: 109 mg/dL — ABNORMAL HIGH (ref 70–99)
Glucose-Capillary: 127 mg/dL — ABNORMAL HIGH (ref 70–99)
Glucose-Capillary: 130 mg/dL — ABNORMAL HIGH (ref 70–99)
Glucose-Capillary: 155 mg/dL — ABNORMAL HIGH (ref 70–99)
Glucose-Capillary: 81 mg/dL (ref 70–99)
Glucose-Capillary: 88 mg/dL (ref 70–99)
Glucose-Capillary: 90 mg/dL (ref 70–99)
Glucose-Capillary: 92 mg/dL (ref 70–99)
Glucose-Capillary: 95 mg/dL (ref 70–99)
Glucose-Capillary: 95 mg/dL (ref 70–99)
Glucose-Capillary: 97 mg/dL (ref 70–99)

## 2011-03-25 LAB — URINALYSIS, ROUTINE W REFLEX MICROSCOPIC
Bilirubin Urine: NEGATIVE
Glucose, UA: NEGATIVE mg/dL
Hgb urine dipstick: NEGATIVE
Ketones, ur: NEGATIVE mg/dL
Protein, ur: NEGATIVE mg/dL
Urobilinogen, UA: 1 mg/dL (ref 0.0–1.0)

## 2011-03-25 LAB — URINE MICROSCOPIC-ADD ON

## 2011-03-25 LAB — TYPE AND SCREEN: ABO/RH(D): O POS

## 2011-03-25 LAB — CBC
HCT: 40 % (ref 36.0–46.0)
MCHC: 34.3 g/dL (ref 30.0–36.0)
MCV: 90.7 fL (ref 78.0–100.0)
RBC: 4.41 MIL/uL (ref 3.87–5.11)
WBC: 6 10*3/uL (ref 4.0–10.5)

## 2011-03-25 LAB — BASIC METABOLIC PANEL
BUN: 25 mg/dL — ABNORMAL HIGH (ref 6–23)
CO2: 26 mEq/L (ref 19–32)
Chloride: 107 mEq/L (ref 96–112)
Creatinine, Ser: 0.93 mg/dL (ref 0.4–1.2)
GFR calc Af Amer: >60 mL/min (ref >60)
Potassium: 4.8 mEq/L (ref 3.5–5.1)

## 2011-03-25 LAB — URINE CULTURE: Colony Count: >=100000

## 2011-03-25 LAB — ABO/RH: ABO/RH(D): O POS

## 2011-03-31 ENCOUNTER — Ambulatory Visit (INDEPENDENT_AMBULATORY_CARE_PROVIDER_SITE_OTHER): Payer: Medicare Other | Admitting: Internal Medicine

## 2011-03-31 ENCOUNTER — Encounter: Payer: Self-pay | Admitting: Internal Medicine

## 2011-03-31 VITALS — BP 128/80 | HR 100 | Ht 62.0 in | Wt 133.0 lb

## 2011-03-31 DIAGNOSIS — K219 Gastro-esophageal reflux disease without esophagitis: Secondary | ICD-10-CM

## 2011-03-31 DIAGNOSIS — E119 Type 2 diabetes mellitus without complications: Secondary | ICD-10-CM

## 2011-03-31 DIAGNOSIS — R1310 Dysphagia, unspecified: Secondary | ICD-10-CM

## 2011-03-31 NOTE — Progress Notes (Signed)
HISTORY OF PRESENT ILLNESS:  [name] is a 77 y.o. female with the below listed medical history who presents today regarding dysphagia. She has been seen in this office over the years for chronic abdominal pain, and GERD. Her history is well documented. She presents today with a several week history of intermittent solid food dysphagia. She has undergone upper endoscopy with esophageal dilation previously. She is on no regular medication for GERD. Her GI review of systems is otherwise negative. Her chronic medical problems are stable, including diabetes.  REVIEW OF SYSTEMS:  All non-GI ROS negative except for backache, cough, decreased hearing, urinary incontinence.  Past Medical History  Diagnosis Date  . Cyst and pseudocyst of pancreas   . Unspecified sleep apnea   . Unspecified essential hypertension   . Other and unspecified hyperlipidemia   . Polymyalgia rheumatica   . Personal history of unspecified digestive disease   . Restless legs syndrome (RLS)   . Insomnia, unspecified   . Other malaise and fatigue   . Diverticulosis of colon (without mention of hemorrhage)   . Acute gastritis without mention of hemorrhage   . Duodenitis without mention of hemorrhage   . Diaphragmatic hernia without mention of obstruction or gangrene   . Esophageal reflux   . Stricture and stenosis of esophagus     Past Surgical History  Procedure Date  . Oophorectomy   . Rotator cuff repair     Social History [name]  reports that she has never smoked. She has never used smokeless tobacco. She reports that she does not drink alcohol or use illicit drugs.  family history includes Melanoma in her daughter and son.  Allergies  Allergen Reactions  . Penicillins   . Sulfa Antibiotics        PHYSICAL EXAMINATION: Vital signs: BP 128/80  Pulse 100  Ht 5\' 2"  (1.575 m)  Wt 133 lb (60.328 kg)  BMI 24.33 kg/m2  Constitutional: generally well-appearing, no acute  distress Psychiatric: alert and oriented x3, cooperative Eyes: extraocular movements intact, anicteric, conjunctiva pink Mouth: oral pharynx moist, no lesions Neck: supple no lymphadenopathy Cardiovascular: heart regular rate and rhythm, no murmur Lungs: clear to auscultation bilaterally Abdomen: soft, nontender, nondistended, no obvious ascites, no peritoneal signs, normal bowel sounds, no organomegaly Extremities: no lower extremity edema bilaterally Skin: no lesions on visible extremities Neuro: No focal deficits.    ASSESSMENT:  #1. Intermittent solid food dysphagia likely due to known peptic stricture #2. GERD #3. Chronic abdominal pain. Currently without complaints. #4. Multiple medical problems including diabetes   PLAN:  #1. Upper endoscopy with esophageal dilation.The nature of the procedure, as well as the risks, benefits, and alternatives were carefully and thoroughly reviewed with the patient. Ample time for discussion and questions allowed. The patient understood, was satisfied, and agreed to proceed.  #2. Hold diabetic medications the day of the exam until resuming normal oral intake #3. Suspect she will benefit from regular PPI therapy post dilation

## 2011-03-31 NOTE — Patient Instructions (Signed)
EGD [place] 04/03/11 3:00 pm arrive at 2:00 pm on 4th floor  Endoscopy brochure given for your review. Hold Diabetic medication day of procedure.

## 2011-04-01 ENCOUNTER — Encounter: Payer: Self-pay | Admitting: Internal Medicine

## 2011-04-02 ENCOUNTER — Encounter: Payer: Self-pay | Admitting: Internal Medicine

## 2011-04-03 ENCOUNTER — Ambulatory Visit (AMBULATORY_SURGERY_CENTER): Payer: Medicare Other | Admitting: Internal Medicine

## 2011-04-03 ENCOUNTER — Encounter: Payer: Self-pay | Admitting: Internal Medicine

## 2011-04-03 DIAGNOSIS — K2100 Gastro-esophageal reflux disease with esophagitis, without bleeding: Secondary | ICD-10-CM

## 2011-04-03 DIAGNOSIS — R1310 Dysphagia, unspecified: Secondary | ICD-10-CM

## 2011-04-03 DIAGNOSIS — K449 Diaphragmatic hernia without obstruction or gangrene: Secondary | ICD-10-CM

## 2011-04-03 DIAGNOSIS — K222 Esophageal obstruction: Secondary | ICD-10-CM

## 2011-04-03 DIAGNOSIS — K209 Esophagitis, unspecified without bleeding: Secondary | ICD-10-CM

## 2011-04-03 MED ORDER — SODIUM CHLORIDE 0.9 % IV SOLN: 500.0000 mL | INTRAVENOUS | Status: DC

## 2011-04-03 NOTE — Patient Instructions (Signed)
Discharged instructions given with verbal understanding. Handouts on stricture, hiatal hernia, gerd, and a dilatation diet given. Resume previous medications.

## 2011-04-04 ENCOUNTER — Telehealth: Payer: Self-pay

## 2011-04-04 NOTE — Telephone Encounter (Signed)

## 2011-04-22 NOTE — Discharge Summary (Signed)
[name], [name] NO.:  [id number]   MEDICAL RECORD NO.:  [id number]          PATIENT TYPE:  IPS   LOCATION:  0092                         FACILITY:  [facility]   PHYSICIAN:  Meredith Staggers, M.D.DATE OF BIRTH:  [birth year]   DATE OF ADMISSION:  02/02/2009  DATE OF DISCHARGE:                               DISCHARGE SUMMARY   DISCHARGE DIAGNOSES:  1. Lumbar stenosis with lumbar L4-5 spondylolisthesis and      radiculopathy status post decompression lumbar L4-5 laminectomy on      January 22, 2009.  2. Pain management.  3. Non-insulin-dependent diabetes mellitus.  4. Anemia.  5. Restless leg syndrome.  6. Depression.   This is an 77 year old female admitted on January 22, 2009, with  progressive low back pain radiating to the lower extremities.  X-rays  and imaging showed lumbar stenosis, lumbar L4-5 spondylolisthesis, and  radiculopathy.  Underwent decompression laminectomy of lumbar L4-5  posterior lumbar interbody arthrodesis on January 22, 2009, per Dr.  Christella Noa.  Pain control with PCA discontinued on January 26, 2009,  placed on Lidoderm patch, Skelaxin, Percocet.  Back brace on when out of  bed.  Right foot pain with x-rays on February 01, 2009, negative except  for some mild degenerative changes.  The patient was admitted for a  comprehensive rehab program.   PAST MEDICAL HISTORY:  See discharge diagnoses.  No alcohol or tobacco.   ALLERGIES:  CODEINE.   SOCIAL HISTORY:  Lives with granddaughter.  The patient lived alone  until December 2009.  Granddaughter will begin working in 2 weeks.  Local son is retired and can assist.  They live in a 2-level home,  bedroom downstairs 2 steps to entry.  Functional history prior to  admission, used a walker and wheelchair, no driving.  Functional status  upon admission to [facility] was minimal assist 8 feet with a  rolling walker, minimal assist bed to chair transfers and sit to stand,  minimal assist  bed mobility with cues, minimal assist to don and doff  brace, max to total assist for activities of daily living.   Medications prior to admission were:  1. Lidoderm patch 5%.  2. Glucophage 1000 mg daily.  3. Cymbalta 60 mg twice daily.  4. ReQuip 3 mg at bedtime.   PHYSICAL EXAMINATION:  Blood pressure 144/72, pulse 71, temperature 97,  respirations 16.  This was an alert female in no acute distress,  oriented x3.  Deep tendon reflexes were 2+.  Calves remained cool  without any swelling, erythema, nontender.  Sensation decreased to light  touch in left lower extremity.  Back incision clean, dry, and intact  with Steri-Strips.  Lungs clear to auscultation.  Cardiac regular rate  and rhythm.  Abdomen is soft, nontender.  Good bowel sounds.   REHABILITATION HOSPITAL COURSE:  The patient was admitted to Inpatient  Rehab Services with therapies initiated on a 3-hour daily basis  consisting of physical therapy, occupational therapy, and rehabilitation  nursing.  The following issues were addressed during the patient's  rehabilitation stay.  Pertaining to Ms. Whipkey's lumbar stenosis  with  radiculopathy, she had undergone decompression lumbar L4-5 on January 23, 2008.  She would follow up with Dr. Ashok Pall, neurovascular  sensation intact, back brace on when out of bed.  Pain management  ongoing with the use of a Lidoderm patch, Skelaxin, Percocet, and  Cymbalta.  She had been on some Neurontin, this was placed on hold on  February 08, 2009.  Her blood sugars were well controlled at 121-132/98.  She remained on her Glucophage as well as diabetic diet.  Noted  postoperative anemia 8.7.  She was transfused 2 units of packed red  blood cells on February 07, 2009.  She tolerated this quite nicely.  Transfusion completed due to some mild orthostatic changes, likely  related to her low hemoglobin.  This had resolved after transfusion.  She remained on her ReQuip for restless leg syndrome without  issue.  She  did have a documented history of depression.  She remained on her  Cymbalta with emotional support provided.  She did receive a venous  Doppler study during her rehabilitation stay due to some lower extremity  pain.  No swelling or erythema.  Doppler studies were negative.   The patient received weekly collaborative interdisciplinary team  conferences to discuss estimated length of stay.  Family teaching and  any barriers to discharge was overall minimal assist to close  supervision for ambulation with a rolling walker set up for upper body  activities of daily living, minimal assist lower body, supervision with  occasional cues to place her brace.  She exhibited no unsafe behavior.  She was continent of bowel and bladder, voiding without difficulty.  She  was discharged to home.   Discharge medications at time of dictation included:  1. ReQuip 3 mg at bedtime.  2. Lidoderm patch, change every 12 hours.  3. Glucophage 500 mg daily.  4. Cymbalta 60 mg twice daily.  5. Ferrous sulfate 325 mg twice daily.  6. Skelaxin 400 mg every 4 hours as needed for spasms.  7. Percocet 5/325 mg 1-2 tablets every 4 hours as needed for pain,      dispense of 90 tablets.   Her diet was a diabetic diet.   SPECIAL INSTRUCTIONS:  Continue brace as advised per Dr. Christella Noa,  Neurosurgery, [phone].  No smoking, no driving, no alcohol.      Lauraine Rinne, P.A.      Meredith Staggers, M.D.  Electronically Signed    DA/MEDQ  D:  02/08/2009  T:  02/09/2009  Job:  [id number]   cc:   Ashok Pall, M.D.

## 2011-04-22 NOTE — Assessment & Plan Note (Signed)
[place] OFFICE NOTE   NAME:[name], [name] NEUBERGER                      MRN:          [id number]  DATE:07/12/2007                            DOB:          01/08/1919    CHIEF COMPLAINT:  Abdominal pain x1 week, progressive.   HISTORY:  Finesse is a very nice 77 year old white female known to Dr.  Henrene Pastor, who has remote history of Crohn's ileocolitis.  She also is  followed for GERD and peptic stricture.  Patient underwent a fairly  extensive workup of approximately one year ago for abdominal pain and  weight loss.  This workup was unrevealing as to the cause of her  symptoms.  Fortunately, her symptoms eventually abated, and her weight  stabilized.  She does have a history of polymyalgia rheumatica,  hyperlipidemia, hypertension, sleep apnea, and restless legs syndrome.  She had undergone MRA of the abdomen, which was unremarkable for any  obvious mesenteric vascular disease, EGD in April, 2007, which showed a  large hiatal hernia with some question of paraesophageal component.  A  subsequent barium swallow upper GI did not show any paraesophageal  component to the hernia.  She also had EUS with Dr. Ardis Hughs because of a  cystic lesion found in the neck of her pancreas on CT.  This was found  to be a cyst, and cytology was negative.  The CT was otherwise  unrevealing for the source of her symptoms.   Patient says that all of those symptoms had abated last Augsut and that  she has been fine until the last week or so when they started back up.  She says over this past weekend, she developed some very sharp, knife-  like mid abdominal pain after eating some hors d'oeuvres at the function  she had gone to.  The pain eventually subsided after she had taken a  pain pill at home, and then yesterday after she had eaten lunch with her  family, she developed severe pain again, which she says feels like more  in the left mid  abdomen.  Again, she felt bad enough that she had to go  home, like down, and take hydrocodone.  She does feel nauseated with the  pain and still feels nauseated and generally uncomfortable today.  She  has not had any vomiting.  No fevers or chills.  She says her stools  alternate between constipation and loose stools and that has not  changed.  She has had no melena or hematochezia.   CURRENT MEDICATIONS:  1. Metformin 500 b.i.d.  2. Requip 2 mg nightly, 1 at 7 p.m.  3. Enablex 7.5 mg daily.  4. Hydrocodone p.r.n.   ALLERGIES:  No known drug allergies.   PHYSICAL EXAMINATION:  A well-developed elderly white female in no acute  distress.  Weight is 139.  Blood pressure 122/72.  Pulse is 80.  HEENT:  Normocephalic and atraumatic.  EOMI.  PERRLA.  Sclerae are  anicteric.  NECK:  Supple.  CARDIOVASCULAR:  Regular rate and rhythm with S1 and S2.  No murmur, rub  or gallop.  ABDOMEN:  Soft.  Bowel sounds are present and hyperactive bowel sounds.  There is no palpable mass or hepatosplenomegaly.  No bruit.  RECTAL:  Heme negative and without mass.   IMPRESSION:  An 77 year old white female with recurrent mid abdominal  pain and nausea with remote history of Crohn's ileitis.   PLAN:  1. Check CBC with diff, CMET, amylase, and lipase today.  2. Schedule CT scan of the abdomen and pelvis as soon as possible.  3. Patient was offered pain medication.  She does have hydrocodone at      home, which she finds helpful and was encouraged to use this every      4-6 hours as needed.  4. Depending on the results of CT, she may need a trial of Entocort.      I have given her samples of Protonix today to restart 40 mg p.o.      q.a.m.  5. Return office visit with Dr. Henrene Pastor in one week.      Nicoletta Ba, PA-C  Electronically Signed      Docia Chuck. Henrene Pastor, MD  Electronically Signed   AE/MedQ  DD: 07/13/2007  DT: 07/14/2007  Job #: [id number]

## 2011-04-22 NOTE — H&P (Signed)
[name], [name] NO.:  [id number]   MEDICAL RECORD NO.:  [id number]          PATIENT TYPE:  INP   LOCATION:  3029                         FACILITY:  [facility]   PHYSICIAN:  Meredith Staggers, M.D.DATE OF BIRTH:  03-Apr-1919   DATE OF ADMISSION:  01/22/2009  DATE OF DISCHARGE:  02/02/2009                              HISTORY & PHYSICAL   CHIEF COMPLAINTS:  Low back pain and radiculopathy.   HISTORY OF PRESENT ILLNESS:  A pleasant 77 year old white female,  admitted on January 22, 2009, with progressive back pain radiating to  both legs.  X-ray revealed lumbar stenosis and spondylolisthesis with  likely radiculopathy.  The patient underwent decompressive laminectomy  at L4-L5 level with posterior lumbar inter body arthrodesis on January 22, 2009, by Dr. Christella Noa.  Pain control with PCA initially.  She has  also been Lidoderm patch, Skelaxin, and Percocet.  She is ordered to  wear back brace when she is out of bed.  She had a right foot x-ray,  which was negative except for some mild degenerative changes.  He has  had urinary incontinence and dysuria with timed voiding trial planned.  No UA and C and S was done.  Due to ongoing functional and pain issues,  it was felt that she could benefit from inpatient rehab admission and  thus the patient was admitted today.   REVIEW OF SYSTEMS:  Notable for depression, low back pain, hip pain  radiating to both legs, incontinence, decreased hearing.  Full review is  in the written H and P.   PAST MEDICAL HISTORY:  Positive for noninsulin-requiring diabetes,  gastroesophageal reflux disease, restless legs syndrome, depression,  right shoulder surgery x2, nasal fracture post fall, cataract  extraction, esophageal dilatation.  She denies alcohol or tobacco use.   FAMILY HISTORY:  Positive for CAD.   SOCIAL HISTORY:  The patient lives with her granddaughter.  She lived  alone up until December 2009.  A granddaughter will be  working in 2  weeks.  Local son is retired.  She has a 2-level bed house with bedroom  downstairs and 2 steps to enter.  It is possible that she may stay with  the other son when she goes home.   ALLERGIES:  CODEINE.   HOME MEDICATIONS:  Lidoderm, metformin, Cymbalta, ReQuip.   LABORATORY DATA:  Hemoglobin 13.7, white count 6.2, platelets 225.  Sodium 142, potassium 4.8, BUN 25, creatinine 0.9.   PHYSICAL EXAMINATION:  VITAL SIGNS:  Blood pressure is 144/72, pulse is  71, respiratory 16, temperature 98.2.  GENERAL:  The patient is pleasant, alert, and oriented x3.  HEENT:  Pupils are equally round and reactive to light.  Ear, nose, and  throat exam notable for multiple missing teeth but mucosa is pink and  moist.  NECK:  Supple without JVD or lymphadenopathy.  CHEST:  Clear to auscultation bilaterally without wheezes, rales, or  rhonchi.  HEART:  Regular rate and rhythm without murmur, rubs, or gallops.  ABDOMEN:  Soft, nontender.  Bowel sounds are positive.  MUSCULOSKELETAL:  Notable for significant hip pain.  She seems to have a  lot of pain at bilateral greater trochanters, worse with pressure and  hip flexion.  Pain seems to be most prominent from the hip to the knee.  SKIN:  Notable for intact lumbar incision without strips or staples.  NEUROLOGIC:  Cranial nerves II-XII are grossly intact.  She was a bit  hard of hearing.  Reflexes are 1+ and sensation is grossly intact.  Judgment, orientation, memory, and mood were all within age-related  limits.  Strength is 4/5 in the upper extremities proximal and distal.  Lower extremity strength is 2 to 2+/5 proximal due to pain at the hips,  3+ at the knees and 4 to 4+/5 at the ankles.   POST ADMISSION PHYSICIAN EVALUATION:  1. Functional deficits secondary to lumbar stenosis with low L4-L5      spondylolisthesis and radiculopathy:  The patient is status post      decompression and laminectomy at L4-L5, postoperative day #11       today.  The patient's picture is also complicated by likely      trochanteric bursitis in the hips.  2. The patient is admitted to receive collaborative interdisciplinary      care between the physiatrist, rehab nursing staff, and therapy      team.  3. The patient's level of medical complexity and substantial therapy      needs in context of that medical necessity cannot be provided at a      lesser intensity of care.  4. The patient has experienced substantial functional loss from her      baseline.  Upon evaluation and most recent assessment yesterday,      the patient was min to mod assist with bed mobility, min to mod      assist with basic transfers, min assist 80 feet with rolling      walker.  She is min to mod assist with ADLs and encouragement due      to pain.  The patient prior to hospitalization was independent      using a walker and wheelchair but did not drive.  Judging by the      patient's diagnosis, physical exam, and functional history, she has      a potential for functional progress, which will result in      measurable gains while in inpatient rehab.  These gains will be of      substantial and practical use upon discharge to home where she will      have assistance of her family in facilitating mobility and self-      care, etc.  5. Physiatrist will provide 24-hour management of medical needs, as      well as oversight of therapy plan/treatment, and provide guidance      as appropriate regarding interaction of the 2.  Medical problem      list and plan are listed below.  6. 24-hour rehab nursing will assist in management of the patient's      pain issues, as well as skin care needs appropriate bowel and      bladder function, safety awareness, medication administration,      nutrition, integration of therapy, concept, techniques, and      education.  7. PT will assess and treat for lower extremity use, pain management,      range of motion, adaptive equipment,  functional mobility, gait, and      family education with goals supervision to modified independent.  8. OT will assess and treat for upper extremity use and ADLs, as well      as adaptive equipment, donning and doffing of lumbosacral corset      and family education as appropriate with goals at a modified      independent to min assist level.  9. Case management/social worker will assess and treat for      psychosocial issues and discharge planning.  10.Team conference will be held weekly to assess progress towards      goals and to determine barriers to discharge.  11.The patient has admitted sufficient medical stability and exercise      capacity to tolerate at least 3 hours of therapy per day at least 5      days per week.  12.Estimated length of stay is 7-10 days.  Prognosis is good.   MEDICAL PROBLEM LIST AND PLAN:  1. Postoperative pain control:  Continue Lidoderm patch, Skelaxin,      Percocet, and Cymbalta.  We will add ice t.i.d. to bilateral hips.  2. Diabetes:  Continue Glucophage 500 mg p.o. daily with close      monitoring of CBGs, especially with dietary intake slow to improve.      Adjust accordingly.  3. Stress legs syndrome:  Continue ReQuip.  4. Depression:  Manage with supportive therapy by staff for now.  We      will follow.  5. Deep venous thrombosis prophylaxis:  Add subcu Lovenox 30 daily.      Meredith Staggers, M.D.  Electronically Signed     ZTS/MEDQ  D:  02/02/2009  T:  02/03/2009  Job:  [id number]   cc:   Ashok Pall, M.D.

## 2011-04-22 NOTE — Assessment & Plan Note (Signed)
[facility] OFFICE NOTE   [name], [name] [name]                      MRN:          [id number]  DATE:08/02/2007                            DOB:          [birth year]    HISTORY:  [name] presents today after a recent office visit with [name] Ba PA-C for followup of abdominal pain.  She has a history of  gastroesophageal reflux disease, peptic stricture, hiatal hernia, and  probable Crohn's disease.  Last year she underwent an extensive  evaluation for abdominal pain and weight loss.  No true cause found.  Fortunately, her symptoms resolved, and she was able to gain weight.  She was seen by [name] 4, 2008 for 1 week's worth of abdominal pain.  See that dictation for details.  The cause of the discomfort was  uncertain.  CBC, comprehensive metabolic panel, amylase, and lipase were  unremarkable.  CT scan of the abdomen and pelvis revealed no significant  changes from prior scans.  No acute problems.  She has been using  hydrocodone at home for different types of aches and pain.  She requests  a refill of that medication, which was given to her by her neurologist.  She was provided samples as well as a prescription for Protonix, which  she takes.  Her abdominal complaints have resolved.   During today's visit, she complains of generalized fatigue, which she  has had chronically.  She has problems with insomnia due to restless  legs.  She requests hydrocodone, and appears depressed.  I have asked  her if she has discussed this with her family doctor.  She says yes.   CURRENT MEDICATIONS:  Include Requip, Enablex, metformin, pantoprazole,  and multivitamin.  She also uses hydrocodone and Percocet as needed.   PHYSICAL EXAMINATION:  Finds a well-appearing, somewhat depressed-  appearing elderly female in no acute distress.  Blood pressure is 134/62.  Heart rate is 88.  Weight is 143.2 pounds  (increased 4  pounds).  HEENT:  Sclerae anicteric.  Conjunctivae are pink.  Oral mucosa is  intact.  No adenopathy.  LUNGS:  Clear.  HEART:  Regular.  ABDOMEN:  Soft without tenderness, mass, or hernia.   IMPRESSION:  1. Transient problems with abdominal pain, now resolved on proton pump      inhibitor.  Possibly reflux.  2. Generalized complaints of fatigue, restless legs, insomnia.  These      to be addressed by her primary care doctor.   RECOMMENDATIONS:  1. Continue Protonix.  2. GI followup as needed for recurrent abdominal complaints.  3. Return to Dr. Nyoka Cowden regarding non-GI complaints.     Docia Chuck. Henrene Pastor, MD  Electronically Signed    JNP/MedQ  DD: 08/02/2007  DT: 08/03/2007  Job #: [id number]   cc:   Viviann Spare. Nyoka Cowden, M.D.

## 2011-04-22 NOTE — Op Note (Signed)
NAMEMELENA, HAYES NO.:  [id number]   MEDICAL RECORD NO.:  [id number]          PATIENT TYPE:  AMB   LOCATION:  [place]                          FACILITY:  [facility]   PHYSICIAN:  Silvestre Moment, MD         DATE OF BIRTH:  [birth year]   DATE OF PROCEDURE:  08/10/2008  DATE OF DISCHARGE:  08/10/2008                               OPERATIVE REPORT   PREOPERATIVE DIAGNOSIS:  Closed nasal fracture.   POSTOPERATIVE DIAGNOSIS:  Closed nasal fracture.   PROCEDURE:  Closed reduction nasal fracture.   SURGEON:  Silvestre Moment, MD   ANESTHESIA:  General.   ESTIMATED BLOOD LOSS:  Minimal.   COMPLICATIONS:  None.   INDICATIONS:  The patient is an 77 year old female, who fell and  sustained a closed nasal fracture.  She has had external nasal dorsal  depression causing a nasal deformity.  For these reasons, this procedure  is performed.   FINDINGS:  The patient was noted to have a depressed right nasal dorsum.   PROCEDURE:  The patient was taken to the operating room and placed on  the table in the supine position.  She was then placed under general  endotracheal anesthesia and each of the nasal cavities decongested with  Afrin on cottonoid pledgets.  Left nasal dorsum was medialized and after  cottonoid pledgets were removed and nasal lift was carefully measured to  avoid any penetration higher in the medial canthus.  It was then used to  elevate the right nasal dorsum.  The pack was applied which been coated  with bacitracin ointment.  The nasal Denver splint was then applied to  the external nasal dorsum.  The patient was awakened from anesthesia and  taken to the post anesthesia care unit in stable condition.  There were  no complications.      Silvestre Moment, MD  Electronically Signed     SJ/MEDQ  D:  09/01/2008  T:  09/01/2008  Job:  [id number]   cc:   [facility]

## 2011-04-25 NOTE — H&P (Signed)
[name], [name] NO.:  [id number]   MEDICAL RECORD NO.:  [id number]          PATIENT TYPE:  OBV   LOCATION:  1615                         [facility]ACILITY[facility]   PHYSICIAN:  Gatha Mayer, M.D. LHCDATE OF BIRTH:  November 03, 1919   DATE OF ADMISSION:  06/16/2005  DATE OF DISCHARGE:                                HISTORY & PHYSICAL   CHIEF COMPLAINT:  Severe left lower quadrant abdominal pain.   HISTORY:  [name] is a pleasant 73 (soon to 32) year old lady who has  ileal Crohn's disease.  She has been doing reasonably well on Entocort EC.  Two days ago, she had fairly severe left lower quadrant pain that self  remitted, and had recurrence last night with severe pain, and called the  office.  She had some hard stools and then loose stools with this.  She has  not had terrible nausea and vomiting, though she did spit up a little bit of  phlegm, she tells me.  There has been no fever.  No urinary problems.  It is  similar to a problem that she had in March, at which point I had seen her in  Dr. Blanch Media absence at that time, and though maybe she had diverticulitis,  and had treated her with antibiotics, but subsequently she had persistent  epigastric pain, and was thought to have a large paraesophageal hernia  causing pain.  She denies any bleeding from her bowels.  She has not had any  other infections or problems like that.  Her review of systems is otherwise  negative for active problems at this time, other than her chronic medical  conditions.   PAST MEDICAL HISTORY:  1.  Paraesophageal hernia.  2.  Diverticulosis, question diverticulitis in the past.  3.  Status post remote oophorectomy.  4.  History of Crohn's ileitis, currently doing well on Entocort EC.  Recent      work-up has included EGD and colonoscopy.  EGD on Apr 17, 2005 did show      esophageal stricture, her large hiatal hernia without a paraesophageal      component, acute gastritis and duodenitis.   Her colonoscopy in November      2004 demonstrated diverticulosis.  She had a very redundant colon, and      he only reached the hepatic flexure.  Upper GI with small bowel follow      through demonstrated the tertiary contractions and spasm of the mid and      distal esophagus with pseudodiverticular formation, sliding hiatal      hernia, spasm and irritability of the large segment of the terminal      ileum approximating 20 cm.  That is when she was started on her Entocort      EC at a total dose of 9 mg a day (Apr 24, 2005).  She is not having the      epigastric pain that she had previously one would note.  5.  Prior left rotator cuff repair.  6.  Polymyalgia rheumatica.  7.  History of adult onset diabetes  mellitus.  8.  Dyslipidemia.  9.  Overactive bladder with incontinence problems.   MEDICATIONS:  1.  Zocor 10 mg daily.  2.  Multivitamin daily.  3.  Metformin 500 mg q.h.s.  4.  Requip 1 mg daily.  5.  Aspirin 81 mg daily.  6.  Ditropan XL 10 mg daily.  7.  Aciphex 20 mg daily.  8.  Entocort 3 mg with meals.  9.  Darvocet-N 100 p.r.n.   DRUG ALLERGIES:  None known.   SENSITIVITIES:  Note that the patient is sensitive to CODEINE, which causes  nausea and vomiting, though she has no true allergies.   SOCIAL HISTORY:  The patient lives alone.  She has family in the area, and  is due to go on a trip to [place] this Wednesday (2 days from now).  She  does not smoke or drink.   FAMILY HISTORY:  Daughter died with what we think is lymphoma.  Son had some  unknown type of cancer.   PHYSICAL EXAMINATION:  GENERAL:  An elderly white woman in no acute  distress.  She does not look well, however.  VITAL SIGNS:  Blood pressure 120/74, pulse 80, respirations 18.  The  remainder of the vitals are pending at this time.  Her weight will be added.  HEENT:  Her eyes are anicteric.  Mouth - free of lesions.  NECK:  Supple.  No mass or thyromegaly.  CHEST:  Clear.  BACK:  Nontender.   No CVA tenderness.  HEART:  S1 and S2.  No murmurs or gallops.  ABDOMEN:  Soft, fleshy/doughy.  She is markedly tender in the left lower  quadrant area without an obvious hernia.  Bowel sounds are increased.  There  is some increased tympany.  She does not look all that distended, though I  think she could be.  The remainder of the abdomen is minimally tender in the  right lower quadrant.  The groin is normal.  She is somewhat sensitive in  the groin on the left, however.  EXTREMITIES:  No peripheral edema.  SKIN:  Slightly pale.  There is no acute rash noted.  NEUROLOGIC:  She is alert and oriented x3.  LYMPH NODES:  No neck or supraclavicular nodes palpated.   A 2-view abdomen is pending.  This was done prior to her visit to the  office.   ASSESSMENT:  1.  Severe left lower quadrant pain in a woman with diverticulosis and      Crohn's disease.  It could be diverticulitis, I am not sure; maybe it is      her Crohn's disease.  She has a redundant colon.  Perhaps there is some      sort of torsion.  Note that I did do a rectal exam that showed trace      Hemoccult positive stool and protruding hemorrhoids.  I suspect the      hemorrhoids caused the heme positive stool.  There is very loose stool      there.  She started off with what sounds like obstructive symptoms with      straining to stool, and then some watery stools thereafter.  Small bowel      obstruction is possible, as well; though I think left lower quadrant      pain would be unusual for that, it is certainly possible.  2.  She is on steroids, though not prednisone, which could mask a more      serious  problem, as well.  3.  Dyslipidemia.  4.  Diabetes mellitus.  5.  Hiatal hernia.  Probably does not have a paraesophageal hernia, despite      the suspicions before.  She has concomitant gastroesophageal reflux      disease.  6.  Esophageal spasm. 7.  As reflected in the past medical history.   PLAN:  Admit the patient  to the hospital for observation.  CT scan and  laboratory work-up.  Please see the orders for full details.  IV fluids will  be given.  She is 78, she has fairly severe abdominal pain, which is  unexplained at this point, and she lives alone, and I think hospital  observation admission is warranted at this time, and further plans pending  the results of the studies and response to treatment.       CEG/MEDQ  D:  06/16/2005  T:  06/16/2005  Job:  [id number]   cc:   Docia Chuck. Henrene Pastor, M.D. [facility]   Ricard Dillon, M.D.  [address]  [place]  [place] [address]  Fax: [phone]

## 2011-04-25 NOTE — Op Note (Signed)
NAME:  [name], [name].:  [id number]   MEDICAL RECORD NO.:  [id number]          PATIENT TYPE:  EMS   LOCATION:  ED                           FACILITY:  [facility]   PHYSICIAN:  Waverly Ferrari, M.D.    DATE OF BIRTH:  04-01-1919   DATE OF PROCEDURE:  DATE OF DISCHARGE:  03/06/2006                                 OPERATIVE REPORT   This Dr. Lajoyce Corners dictating a telephone message for Dr. Scarlette Shorts concerning the  patient, [name]. [name] was seen in the emergency room by Dr.  Prudence Davidson.  She presented with rather severe epigastric pain that abated after  IV analgesia was given. Laboratory studies including amylase, lipase, CBC,  LFTs apparently were all normal.  CAT scan showed no change and a very large  paraesophageal hernia, and she was sent home to her previous dwelling. My  impression without seeing the patient was a concern for a torsion on  paraesophageal hernia. Apparently, this has happened a couple of times  before, and I suggested that surgical opinion might be worthwhile obtaining.  She will follow-up with Dr. Henrene Pastor and also possibly with surgery if  preferred by Dr. Prudence Davidson.           ______________________________  Waverly Ferrari, M.D.     GMO/MEDQ  D:  03/07/2006  T:  03/09/2006  Job:  [id number]

## 2011-04-25 NOTE — Op Note (Signed)
[name], BARBONE NO.:  [id number]   MEDICAL RECORD NO.:  [id number]          PATIENT TYPE:  AMB   LOCATION:  DAY                          [facility]ACILITY:  [facility]   PHYSICIAN:  Kipp Brood. Gioffre, M.D.DATE OF BIRTH:  02/17/1919   DATE OF PROCEDURE:  01/29/2005  DATE OF DISCHARGE:                                 OPERATIVE REPORT   SURGEON:  Kipp Brood. Gladstone Lighter, M.D.   ASSISTANT:  Elby Showers. Paitsel, P.A.   PREOPERATIVE DIAGNOSES:  1.  Recurrent tear of the rotator cuff tendon on the right.  2.  Loosened Multitak anchor, right shoulder.  Note she had previous repair      of a rotator cuff tendon 5 years ago and has done extremely well until      recently when she began having difficulty with pain and inability to      elevate her right arm from her side.  3.  Lateral epicondylitis, right elbow.   POSTOPERATIVE DIAGNOSES:  1.  Recurrent tear of the rotator cuff tendon on the right.  2.  Loosened Multitak anchor, right shoulder.  Note she had previous repair      of a rotator cuff tendon 5 years ago and has done extremely well until      recently when she began having difficulty with pain and inability to      elevate her right arm from her side.  3.  Lateral epicondylitis, right elbow.   OPERATION:  1.  Repair of a torn rotator cuff tendon, right shoulder, by using the      tissue mend graft, double thickness, 4 x 4 in size.  2.  Removal of a Multitak anchor.  3.  Open acromionectomy, right shoulder.  4.  Injection of the right lateral epicondyle with 1 cc Depo-Medrol, 3 cc      0.5% Marcaine.   DESCRIPTION OF PROCEDURE:  Under general anesthesia, a routine orthopedic  prep and drape of the right shoulder was carried out.  Prior to doing this,  I did a sterile prep and injected the right lateral epicondyle with 1 cc  Depo-Medrol and 3 cc 0.5% Marcaine.  Sterile prepping of the right shoulder  then was carried out.  She had 1 g IV Ancef preop.  An incision was  made  over the anterior aspect of the right shoulder.  Bleeders were identified  and cauterized.  Self-retaining retractors were inserted.  At this time, I  then went on and noted that the [facility] anchor was completely displaced and  laying openly in the wound.  I removed that first.  I examined the rotator  cuff.  She had a massive amount of fluid that came out of her shoulder.  She  had a severe degenerative type tear in multiple areas of her rotator cuff.  The medial aspect of the cuff was completely absent.  I did the best I could  to piece the pieces of the rotator cuff together and then utilized a 4 x 4  tissue mend graft and sutured that in place.  We first did an  open  acromionectomy with the oscillating saw, then utilized the bur to even out  the undersurface of the acromion.  Note we did the best we could on this  repair taking into consideration what we had to work with here.  We  thoroughly irrigated out the area, reattached the deltoid tendon and muscle  in the usual fashion.  The subcu was closed with 0 Vicryl.  Skin was closed  with metal staples.  A sterile dressing was applied and she was placed in a  shoulder immobilizer.      RAG/MEDQ  D:  01/29/2005  T:  01/29/2005  Job:  [id number]

## 2011-04-25 NOTE — Assessment & Plan Note (Signed)
[place] OFFICE NOTE   Arpi, Diebold CALENE PARADISO                      MRN:          [id number]  DATE:10/26/2006                            DOB:          [birth year]    HISTORY:  The patient presents today for followup.  She is an 77 year old  who has been evaluated in recent months for abdominal pain and weight loss.  She has had an exhaustive workup as previously outlined.  She was last  evaluated July 09, 2006, and, thankfully, found to be doing better for  uncertain reasons.  She was asked to followup at this time.  Since her last  visit, she continues to do well.  In particular, no further problems with  abdominal pain.  Her weight has stabilized.  Actually, she has gained a few  pounds.  Her only complaint today is that of chronic, alternating bowel  habits which she has had for some time.  For loose stools, she will take  Imodium.  For constipation, she will not take any agents.  She has had  incontinence on 3 separate occasions.  No bleeding or other issues.  She  continues on Protonix for her reflux disease.  Currently without reflux  symptoms or recurrent dysphagia.   CURRENT MEDICATIONS:  1. Multivitamin.  2. Protonix.  3. Asacol 1200 mg t.i.d.  4. Citrucel.  5. Paxil.  6. Klonopin.  7. Darvocet p.r.n.   ALLERGIES:  No known drug allergies.   PHYSICAL EXAM:  A well-appearing female in no acute distress.  Blood pressure is 130/80.  Heart rate 68.  Weight is 136 pounds (increased 2  pounds).  HEENT:  Sclerae anicteric.  ABDOMEN:  Soft without tenderness, mass, or hernia.  Good bowel sounds  heard.   IMPRESSION:  1. Recent problems with postprandial abdominal pain and weight loss of      uncertain cause.  The patient has now been doing well for about 5-6      months without pain and stable weight.  Extensive workup as previously      outlined.  2. History of Crohn's ileocolitis on  Asacol.  3. History of reflux disease complicated by peptic stricture.  As well,      large hiatal hernia.  Stable on Protonix.   RECOMMENDATIONS:  1. Will continue Protonix.  2. Refilled Darvocet at the patient's request.  She states this helps her      relax.  3. Daily fiber supplement for alternating bowel habits.  4. GI followup in 6 months.     Docia Chuck. Geri Seminole., MD  Electronically Signed    JNP/MedQ  DD: 10/26/2006  DT: 10/27/2006  Job #: [id number]

## 2011-04-25 NOTE — Assessment & Plan Note (Signed)
[place] OFFICE NOTE   NAME:[name], [name]                      MRN:          [id number]  DATE:07/09/2006                            DOB:          [birth year]    HISTORY:  The patient presents today for followup.  She is an 77 year old  with very interesting problems with persistent abdominal pain exacerbated by  meals and weight loss for which she has undergone exhaustive work-up.  See  the chart for details.  No cause was ascertained though multiple causes  considered.  She was last seen June 02, 2006 and at that time continued with  pain though her weight was stable.  We recommended she push her caloric  intake and follow up at this time.  Interestingly she states she is doing  better.  She is having some dysphagia though much better since her most  recent esophageal dilation.  She is pleased to report that since her last  visit, she has really had no significant abdominal pain and her appetite has  improved.  She has had no further weight loss and no weight gain.  No new  symptoms to report.  She is in the midst of identifying a new primary care  [name] as her doctor is leaving that practice site.   CURRENT MEDICATIONS:  1.  Multivitamin once daily,  2.  Protonix 40 mg daily.  3.  Asacol 1200 mg t.i.d.  4.  Citrucel.  5.  Paxil 25 mg daily.  6.  Klonopin 5 mg at night.   ALLERGIES:  She has no known drug allergies.   PHYSICAL EXAMINATION:  GENERAL:  Pleasant, elderly female in no acute  distress.  She is alert and oriented.  VITAL SIGNS:  Blood pressure 130/70, heart rate 72 and regular, weight 134  pounds (no change since Apr 21, 2006).  HEENT:  Sclerae anicteric.  LUNGS:  Clear.  HEART:  Regular.  ABDOMEN:  Soft without tenderness, mass or hernia. Good bowel sounds heard.   IMPRESSION:  Recent problems with postprandial abdominal pain and weight  loss of uncertain cause.  Clinically  improved over the past month  nonspecifically.  It is conceivable she may have had partial obstruction  symptoms from her Crohn's but this was never proven.  No other significant  problems such as pancreatic cancer or mesenteric ischemia identified.   RECOMMENDATIONS:  At this point would like to continue to follow her  expectantly.  She will continue on her Protonix for reflux disease.  She has  again been encouraged to push her caloric intake.  We will see her back in  the office in a couple of months.                                   Docia Chuck. Geri Seminole., MD   JNP/MedQ  DD:  07/12/2006  DT:  07/13/2006  Job #:  [id number]   cc:   Ricard Dillon, MD

## 2011-04-25 NOTE — H&P (Signed)
[name], SAYLOR NO.:  [id number]   MEDICAL RECORD NO.:  [id number]          PATIENT TYPE:  EMS   LOCATION:  ED                           FACILITY:  [facility]   PHYSICIAN:  Docia Chuck. Henrene Pastor, M.D. [facility] OF BIRTH:  Sep 01, 1919   DATE OF ADMISSION:  02/28/2005  DATE OF DISCHARGE:                                HISTORY & PHYSICAL   CHIEF COMPLAINT:  Severe epigastric pain.   HISTORY:  [name] is a pleasant 77 year old white female known to Dr. Henrene Pastor  with a history of diverticulosis, diverticulitis, esophageal stricture,  which has required dilation, and Crohn's ileitis.  Her last colonoscopy was  done in November, 2004, showing left colon diverticula and no evidence of  Crohn's; however, only reached the transverse colon at that time due to  patient discomfort.  She also has a history of polymyalgia rheumatic, remote  oophorectomy, adult-onset diabetes mellitus, and is status post recent left  rotator cuff repair approximately four weeks ago.  She was seen by Dr.  Carlean Purl on February 24, 2005 as an add-on for complaints of left lower quadrant  pain.  She says that she really has not felt well over the past several  weeks but thought that most of this was due to her shoulder and her shoulder  surgery, then she started developing more persistent left lower quadrant  discomfort and came in for evaluation.  She has not had any recent ileitis  or colitis symptoms.  Her bowel movements have been normal.  She has not  noted any melena or hematochezia.  She has not had any fevers or chills.  She was tender in her left lower quadrant on exam, and it was felt that she  may have diverticulitis and was therefore started on oral Flagyl and Septra.  She has been eating very light since that time, had not had any nausea,  vomiting, fever, or chills, and has not had a bowel movement since Monday,  March 20.  She has been taking her antibiotics.  She says she awakened this  morning at  about 5:30 a.m. with a severe epigastric pain, which was  nonradiating.  This was associated with a mild sense of shortness of breath.  No chest pain.  She did get nauseated and vomited x1 of a clear material.  Again, no diarrhea or bowel movement today.  She says the pain at its worst  this morning was a 12/10 and constant and achy in nature.  She called the  office and was advised to come for STAT CT scan.  She came to radiology;  however, due to the discomfort, they were uncomfortable with pursuing before  evaluation, and she was sent to the ER.  Since arrival in the ER, her pain  has started to ease off spontaneously and is down to a 3/10 at the time of  examination.   Temp is 99.5 in the ER, WBC 7.7, hemoglobin 12.4, hematocrit 36.6.   The remainder of labs are pending.   She is admitted at this time for further diagnostic evaluation with CT scan  of  the abdomen and pelvis and pain control.   CURRENT MEDICATIONS:  1.  Ditropan XL 10 daily  2.  Zocor 10 daily  3.  Multivitamin daily.  4.  Metformin q.h.s.  She is uncertain of the dosage.  5.  Requip 1 mg daily  6.  Aspirin 81 mg daily  7.  Septra DS 1 p.o. b.i.d. and Flagyl 500 b.i.d., both of which she has      been on for four days.   ALLERGIES:  CODEINE, which causes nausea and vomiting.  Allergy past history  as outlined above.   FAMILY HISTORY:  Daughter deceased with what sounds like a lymphoma.  Son  also with a cancer now, although she is uncertain of the type.   SOCIAL HISTORY:  Patient is widowed.  Lives alone.  She is generally self-  sufficient.  Is ambulatory.  No tobacco.  No ETOH.   REVIEW OF SYSTEMS:  CARDIOVASCULAR:  Denies any chest pain or anginal  symptoms.  PULMONARY:  She relates that she is chronically short of breath  with any exertion, walking, etc.  She feels this is due to debilitation.  GENITOURINARY:  Negative.  MUSCULOSKELETAL:  Pertinent for persistent left  shoulder pain.  GI:  As  above.   IMPRESSION:  1.  An 77 year old white female with acute severe epigastric pain.  Etiology      is not clear.  Rule out gastritis, peptic ulcer disease, episode of      biliary colic, pancreatitis, bowel obstruction.  2.  Subacute diverticulitis.  It is unclear whether this is associated with      the above process or not.  3.  Status post remote oophorectomy.  4.  History of Crohn's ileitis, which has been inactive.  5.  Polymyalgia rheumatica.  6.  History of adult-onset diabetes mellitus.  7.  Recent rotator cuff repair.   PLAN:  Patient is admitted to the service of Dr. Scarlette Shorts for bowel rest,  IV fluids, IV Cipro, CT scan of the abdomen and pelvis, pain control, and  further diagnostic workup as indicated.  For details, please see the orders.      AE/MEDQ  D:  02/28/2005  T:  02/28/2005  Job:  [id number]   cc:   Ricard Dillon, M.D.  [address]  [place]  [place] [address]  Fax: [phone]

## 2011-04-25 NOTE — Discharge Summary (Signed)
NAMEVIVIANNA, PICCINI NO.:  [id number]   MEDICAL RECORD NO.:  [id number]          PATIENT TYPE:  INP   LOCATION:  9169                         FACILITY:  [facility]   PHYSICIAN:  Docia Chuck. Henrene Pastor, M.D. [facility] OF BIRTH:  [birth year]   DATE OF ADMISSION:  02/28/2005  DATE OF DISCHARGE:  03/01/2005                                 DISCHARGE SUMMARY   ADMITTING DIAGNOSES:  1.  An 77 year old female with acute severe epigastric pain, etiology not      clear, rule out peptic ulcer disease, biliary colic, bowel obstruction.  2.  Subacute diverticulitis, unclear whether this is associated with the      above process or not.  3.  Status post remote oophorectomy.  4.  History of Crohn's ileitis, inactive.  5.  Polymyalgia rheumatica.  6.  History of adult onset diabetes mellitus.  7.  Recent rotator cuff repair.   DISCHARGE DIAGNOSES:  1.  Acute severe epigastric pain, resolved, probably secondary to large      paraesophageal hernia.  2.  Subacute diverticulitis, unclear whether this is associated with the      above process or not.  3.  Status post remote oophorectomy.  4.  History of Crohn's ileitis, inactive.  5.  Polymyalgia rheumatica.  6.  History of adult-onset diabetes mellitus.  7.  Recent rotator cuff repair.   CONSULTATIONS:  None.   PROCEDURES:  CT scan of the abdomen and pelvis and plain abdominal films.   BRIEF HISTORY:  Emmie is a pleasant 77 year old white female known to Dr.  Henrene Pastor with history of diverticulosis, diverticulitis, esophageal stricture  requiring previous dilation, and Crohn's ileitis which has been inactive.  She last had colonoscopy in November 2004, also with history of polymyalgia  rheumatica, remote oophorectomy, adult-onset diabetes mellitus, and had a  left rotator cuff repair approximately 4 weeks ago.  The patient had been  seen by Dr. Carlean Purl on March 20 as add-on with complaints of left lower  quadrant pain and was felt to have  diverticulitis.  She was started on oral  Flagyl and Septra and said that she had been eating very light since that  time, had not had any nausea, vomiting, fever, or chills but had not had a  bowel movement over the past 4 days.  She was awakened on the morning of  admission about 5:30 a.m. with severe epigastric pain which was  nonradiating.  It was associated with mild shortness of breath.  There was  no associated chest pain.  She did vomit once.  The pain at its worst was a  12/10, constant, and achy in nature.  She called the office, was advised to  come in for stat CT scan.  She came to radiology; however, due to her  discomfort, she was sent to the emergency room for further evaluation prior  to proceeding with the CT scan.  Since arrival in the ER, her pain had  started to ease of spontaneously and was down to 3/10 at the time of  admission.   LABORATORY STUDIES:  On March 24,  WBC of 7.7, hemoglobin 12.4, hematocrit of  36.6, MCV of 89, platelets 204.  On March 25, WBC was 5.6, hemoglobin 11.8,  hematocrit 34.6, sodium 133, potassium 4, glucose 101, BUN 16, creatinine  1.1, albumin 3.5.  Liver function studies normal.  Amylase 29, lipase 15.  Urinalysis small leukocyte esterase. 0-2 WBCs.  EKG showed normal sinus  rhythm.  Chest x-ray showed an ill-defined left hemidiaphragm, suggesting  mild basilar atelectasis or early pneumonia.  CT scan of the abdomen and  pelvis on March 24, large hiatal hernia, possibly paraesophageal, simple  cyst in the liver, vascular calcifications, and sigmoid diverticulosis  without any evidence for active diverticulitis.   HOSPITAL COURSE:  The patient was admitted to the service of Dr. Scarlette Shorts,  who was covering on-call.  She was given Demerol as needed for pain and  Zofran for nausea, vomiting, started on IV Protonix, kept on sips of clear  liquids and scheduled for CT scan of the abdomen and pelvis.  She was also  covered empirically with IV  Cipro given the recent history of  diverticulitis.  She had a benign hospital course.  CT scan as outlined  above.  By the following morning, March 25, she was feeling much better, had  no further complaints of abdominal pain, no nausea or vomiting, was asking  for food.  She was afebrile, and her laboratory studies were unremarkable.  She was seen by Dr. Collene Mares, who was covering on-call and allowed discharge to  home.  She was to continue Prilosec 20 mg p.o. q.a.m., to follow up with Dr.  Henrene Pastor in the office in 2-3 weeks, continue her usual home medications,  including Ditropan, Zocor, multivitamin, metformin, Requip, aspirin 81, and  to complete her Septra and Flagyl course as initiated by Dr. Carlean Purl 5 days  earlier.  She was to hold the Glucophage for another 48 hours post-CT.   CONDITION ON DISCHARGE:  Improved.      AE/MEDQ  D:  04/04/2005  T:  04/05/2005  Job:  [id number]

## 2011-04-25 NOTE — Discharge Summary (Signed)
[name], TON NO.:  [id number]   MEDICAL RECORD NO.:  [id number]          PATIENT TYPE:  INP   LOCATION:  3029                         FACILITY:  [facility]   PHYSICIAN:  Ashok Pall, M.D.     DATE OF BIRTH:  [birth year]   DATE OF ADMISSION:  01/22/2009  DATE OF DISCHARGE:  02/02/2009                               DISCHARGE SUMMARY   ADMITTING DIAGNOSES:  Lumbar stenosis, lumbar instability, lumbar  spondylolisthesis at L4-5 grade 1, L5-S1 lumbar spondylosis.   POSTOPERATIVE DIAGNOSES:  Lumbar stenosis, lumbar instability, lumbar  spondylolisthesis at L4-5 grade 1, L5-S1 lumbar spondylosis.   DISCHARGE DIAGNOSES:  Lumbar stenosis, lumbar instability, lumbar  spondylolisthesis at L4-5 grade 1, L5-S1 lumbar spondylosis.   PROCEDURE:  L4 laminectomy, decompression of L4-5, posterior lumbar  interbody arthrodesis L4-5, posterolateral arthrodesis L4-S1,  hydroxyapatite pedicle screw placement L4-S1, PEEK rod.   COMPLICATIONS:  None.   SURGEON:  Ashok Pall, MD   ASSISTANT:  Elizabeth Sauer, MD   DISCHARGE STATUS:  Alive and well.   [name] is an older woman who presented with severe pain.  [name]  underwent operation and postoperatively, had some severe pain in the  lower extremity.  I therefore, repeated a CT showing that the screws  were in good position.  It was bicortical at L4 on the right side.  There was no damage done and certainly no nerve root compression or  nerve root pain, being pressed by the screw.  She then proceeded to  improve slightly.  She was considered a very good candidate for rehab  and was sent there.  The wound was clean, dry, and no signs of infection  at rehab.  She was voiding, ambulating, and doing fairly well.  I will  see her approximately 1 month after she gets out of rehab.           ______________________________  Ashok Pall, M.D.     KC/MEDQ  D:  03/02/2009  T:  03/03/2009  Job:  [id number]

## 2011-04-25 NOTE — Procedures (Signed)
[name], SIMMERING NO.:  [id number]   MEDICAL RECORD NO.:  [id number]          PATIENT TYPE:  OUT   LOCATION:  SLEEP CENTER                 FACILITY:  [facility]   PHYSICIAN:  Kathee Delton, MD,FCCPDATE OF BIRTH:  Dec 22, 1918   DATE OF STUDY:  01/08/2007                            NOCTURNAL POLYSOMNOGRAM   LOCATION:  Sleep lab.   REFERRING PHYSICIAN:  Danton Sewer, M.D.   INDICATIONS FOR THE STUDY:  Persistent disorder of initiating and  maintaining sleep.  Also persistent disorder of initiating and  maintaining wakefulness.   EPWORTH SLEEPINESS SCORE:  2.   SLEEP ARCHITECTURE:  Patient had a total sleep time of 263 minutes with  significantly decreased slow wave sleep and REM.  Sleep onset latency  was normal at 3.5 minutes, and REM onset was prolonged at 146 minutes.  Sleep efficiency was decreased at 72%, and the sleep was definitely very  restless.   RESPIRATORY DATA:  The patient was found to have 43 hypopneas and 5  apneas for a Respiratory Disturbance Index of 11 events per hour.  Mild-  to-moderate snoring was noted throughout.  The events were not  positional in nature.   OXYGEN DATA:  There was O2 desaturation as low as 84% with the patient's  obstructive events.   CARDIAC DATA:  No clinically significant cardiac arrhythmias.   MOVEMENT-PARASOMNIA:  The patient was found to have 25 leg jerks with 2  per hour resulting in arousal or awakening.   IMPRESSIONS-RECOMMENDATIONS:  1. Mild obstructive sleep apnea/hypopnea syndrome with a Respiratory      Disturbance Index of 11 events per hour and oxygen desaturation as      low as 84%.  The degree of sleep apnea may be underestimated due to      the patient's significantly decreased slow wave sleep and REM.  2. Small numbers of leg jerks with mild sleep disruption.  It is      unclear whether this is clinically significant.      Patient does have a history of restless legs syndrome and takes  Requip accordingly.  Clinical correlation is suggested.      Kathee Delton, MD,FCCP  Diplomate, [place] Board of Sleep  Medicine  Electronically Signed     [facility]/MEDQ  D:  01/13/2007 16:20:57  T:  01/13/2007 21:41:00  Job:  [id number]

## 2011-07-01 ENCOUNTER — Emergency Department (HOSPITAL_COMMUNITY)
Admission: EM | Admit: 2011-07-01 | Discharge: 2011-07-01 | Disposition: A | Discharge status: Home / Self Care | Payer: Medicare Other | Attending: Emergency Medicine | Admitting: Emergency Medicine

## 2011-07-01 DIAGNOSIS — Z79899 Other long term (current) drug therapy: Secondary | ICD-10-CM | POA: Insufficient documentation

## 2011-07-01 DIAGNOSIS — I10 Essential (primary) hypertension: Secondary | ICD-10-CM | POA: Insufficient documentation

## 2011-07-01 DIAGNOSIS — R5381 Other malaise: Secondary | ICD-10-CM | POA: Insufficient documentation

## 2011-07-01 DIAGNOSIS — E785 Hyperlipidemia, unspecified: Secondary | ICD-10-CM | POA: Insufficient documentation

## 2011-07-01 DIAGNOSIS — G2581 Restless legs syndrome: Secondary | ICD-10-CM | POA: Insufficient documentation

## 2011-07-01 LAB — COMPREHENSIVE METABOLIC PANEL
Albumin: 2.9 g/dL — ABNORMAL LOW (ref 3.5–5.2)
Alkaline Phosphatase: 89 U/L (ref 39–117)
BUN: 21 mg/dL (ref 6–23)
Calcium: 9.1 mg/dL (ref 8.4–10.5)
Potassium: 4.1 mEq/L (ref 3.5–5.1)
Sodium: 136 mEq/L (ref 135–145)
Total Protein: 5.9 g/dL — ABNORMAL LOW (ref 6.0–8.3)

## 2011-07-01 LAB — CBC
HCT: 37.7 % (ref 36.0–46.0)
MCH: 30.1 pg (ref 26.0–34.0)
MCHC: 32.6 g/dL (ref 30.0–36.0)
RDW: 13.6 % (ref 11.5–15.5)

## 2011-07-01 LAB — URINALYSIS, ROUTINE W REFLEX MICROSCOPIC
Glucose, UA: NEGATIVE mg/dL
Ketones, ur: NEGATIVE mg/dL
Leukocytes, UA: NEGATIVE
Nitrite: NEGATIVE
Specific Gravity, Urine: 1.028 (ref 1.005–1.030)
pH: 5 (ref 5.0–8.0)

## 2011-07-01 LAB — DIFFERENTIAL
Basophils Absolute: 0 10*3/uL (ref 0.0–0.1)
Basophils Relative: 1 % (ref 0–1)
Eosinophils Relative: 2 % (ref 0–5)
Monocytes Absolute: 0.6 10*3/uL (ref 0.1–1.0)

## 2011-07-02 LAB — URINE CULTURE
Culture  Setup Time: 201207241818
Culture: NO GROWTH

## 2011-07-31 ENCOUNTER — Other Ambulatory Visit: Payer: Self-pay | Admitting: Neurosurgery

## 2011-07-31 DIAGNOSIS — M545 Low back pain, unspecified: Secondary | ICD-10-CM

## 2011-08-05 ENCOUNTER — Ambulatory Visit
Admission: RE | Admit: 2011-08-05 | Discharge: 2011-08-05 | Disposition: A | Discharge status: Home / Self Care | Payer: Medicare Other | Source: Ambulatory Visit | Attending: Neurosurgery | Admitting: Neurosurgery

## 2011-08-05 DIAGNOSIS — M545 Low back pain, unspecified: Secondary | ICD-10-CM

## 2011-09-10 LAB — GLUCOSE, CAPILLARY: Glucose-Capillary: 122 — ABNORMAL HIGH

## 2011-09-12 LAB — COMPREHENSIVE METABOLIC PANEL
ALT: 14 U/L (ref 0–35)
AST: 17 U/L (ref 0–37)
Alkaline Phosphatase: 100 U/L (ref 39–117)
Calcium: 9.5 mg/dL (ref 8.4–10.5)
GFR calc Af Amer: >60 mL/min (ref >60)
Potassium: 4.8 mEq/L (ref 3.5–5.1)
Sodium: 141 mEq/L (ref 135–145)
Total Protein: 6.2 g/dL (ref 6.0–8.3)

## 2011-09-12 LAB — POCT CARDIAC MARKERS
CKMB, poc: <1 ng/mL — ABNORMAL LOW (ref 1.0–8.0)
Myoglobin, poc: 61.9 ng/mL (ref 12–200)
Troponin i, poc: <0.05 ng/mL (ref 0.00–0.09)

## 2011-09-12 LAB — CBC
MCHC: 33 g/dL (ref 30.0–36.0)
RBC: 4.21 MIL/uL (ref 3.87–5.11)
RDW: 14 % (ref 11.5–15.5)

## 2011-09-12 LAB — URINALYSIS, ROUTINE W REFLEX MICROSCOPIC
Bilirubin Urine: NEGATIVE
Glucose, UA: NEGATIVE mg/dL
Ketones, ur: NEGATIVE mg/dL
Nitrite: POSITIVE — AB
Specific Gravity, Urine: 1.019 (ref 1.005–1.030)
pH: 6.5 (ref 5.0–8.0)

## 2011-09-12 LAB — DIFFERENTIAL
Basophils Relative: 1 % (ref 0–1)
Eosinophils Absolute: 0.4 10*3/uL (ref 0.0–0.7)
Eosinophils Relative: 6 % — ABNORMAL HIGH (ref 0–5)
Lymphs Abs: 2.3 10*3/uL (ref 0.7–4.0)
Monocytes Absolute: 0.4 10*3/uL (ref 0.1–1.0)
Monocytes Relative: 7 % (ref 3–12)

## 2011-09-12 LAB — URINE CULTURE

## 2011-09-12 LAB — URINE MICROSCOPIC-ADD ON

## 2011-09-12 LAB — PROTIME-INR: INR: 0.9 (ref 0.00–1.49)

## 2011-09-12 LAB — B-NATRIURETIC PEPTIDE (CONVERTED LAB): Pro B Natriuretic peptide (BNP): 31.3 pg/mL (ref 0.0–100.0)

## 2011-09-24 LAB — BASIC METABOLIC PANEL
BUN: 29 — ABNORMAL HIGH
CO2: 23
Chloride: 106
Potassium: 4.9

## 2011-12-09 DIAGNOSIS — I82402 Acute embolism and thrombosis of unspecified deep veins of left lower extremity: Secondary | ICD-10-CM

## 2011-12-09 HISTORY — DX: Acute embolism and thrombosis of unspecified deep veins of left lower extremity: I82.402

## 2011-12-16 DIAGNOSIS — G2589 Other specified extrapyramidal and movement disorders: Secondary | ICD-10-CM | POA: Diagnosis not present

## 2011-12-16 DIAGNOSIS — F3289 Other specified depressive episodes: Secondary | ICD-10-CM | POA: Diagnosis not present

## 2011-12-16 DIAGNOSIS — E538 Deficiency of other specified B group vitamins: Secondary | ICD-10-CM | POA: Diagnosis not present

## 2011-12-16 DIAGNOSIS — M533 Sacrococcygeal disorders, not elsewhere classified: Secondary | ICD-10-CM | POA: Diagnosis not present

## 2011-12-16 DIAGNOSIS — F329 Major depressive disorder, single episode, unspecified: Secondary | ICD-10-CM | POA: Diagnosis not present

## 2011-12-16 DIAGNOSIS — E119 Type 2 diabetes mellitus without complications: Secondary | ICD-10-CM | POA: Diagnosis not present

## 2011-12-16 DIAGNOSIS — M545 Low back pain, unspecified: Secondary | ICD-10-CM | POA: Diagnosis not present

## 2011-12-16 DIAGNOSIS — G609 Hereditary and idiopathic neuropathy, unspecified: Secondary | ICD-10-CM | POA: Diagnosis not present

## 2011-12-16 DIAGNOSIS — I10 Essential (primary) hypertension: Secondary | ICD-10-CM | POA: Diagnosis not present

## 2012-01-06 DIAGNOSIS — M533 Sacrococcygeal disorders, not elsewhere classified: Secondary | ICD-10-CM | POA: Diagnosis not present

## 2012-01-06 DIAGNOSIS — M545 Low back pain, unspecified: Secondary | ICD-10-CM | POA: Diagnosis not present

## 2012-01-07 ENCOUNTER — Encounter (HOSPITAL_COMMUNITY): Payer: Self-pay

## 2012-01-07 ENCOUNTER — Inpatient Hospital Stay (HOSPITAL_COMMUNITY)
Admission: RE | Admit: 2012-01-07 | Discharge: 2012-01-09 | DRG: 301 | Disposition: A | Discharge status: Home / Self Care | Payer: Medicare Other | Source: Ambulatory Visit | Attending: Internal Medicine | Admitting: Internal Medicine

## 2012-01-07 VITALS — BP 131/82 | HR 68 | Temp 98.2°F | Resp 15 | Ht 62.0 in | Wt 130.7 lb

## 2012-01-07 DIAGNOSIS — G2589 Other specified extrapyramidal and movement disorders: Secondary | ICD-10-CM | POA: Diagnosis not present

## 2012-01-07 DIAGNOSIS — M7989 Other specified soft tissue disorders: Secondary | ICD-10-CM

## 2012-01-07 DIAGNOSIS — K219 Gastro-esophageal reflux disease without esophagitis: Secondary | ICD-10-CM | POA: Diagnosis present

## 2012-01-07 DIAGNOSIS — I824Z9 Acute embolism and thrombosis of unspecified deep veins of unspecified distal lower extremity: Principal | ICD-10-CM | POA: Diagnosis present

## 2012-01-07 DIAGNOSIS — F3289 Other specified depressive episodes: Secondary | ICD-10-CM | POA: Diagnosis not present

## 2012-01-07 DIAGNOSIS — G609 Hereditary and idiopathic neuropathy, unspecified: Secondary | ICD-10-CM | POA: Diagnosis not present

## 2012-01-07 DIAGNOSIS — M545 Low back pain, unspecified: Secondary | ICD-10-CM | POA: Diagnosis not present

## 2012-01-07 DIAGNOSIS — G4730 Sleep apnea, unspecified: Secondary | ICD-10-CM

## 2012-01-07 DIAGNOSIS — G4700 Insomnia, unspecified: Secondary | ICD-10-CM | POA: Diagnosis present

## 2012-01-07 DIAGNOSIS — R5381 Other malaise: Secondary | ICD-10-CM

## 2012-01-07 DIAGNOSIS — N318 Other neuromuscular dysfunction of bladder: Secondary | ICD-10-CM | POA: Diagnosis present

## 2012-01-07 DIAGNOSIS — K449 Diaphragmatic hernia without obstruction or gangrene: Secondary | ICD-10-CM

## 2012-01-07 DIAGNOSIS — G2581 Restless legs syndrome: Secondary | ICD-10-CM

## 2012-01-07 DIAGNOSIS — I824Y9 Acute embolism and thrombosis of unspecified deep veins of unspecified proximal lower extremity: Secondary | ICD-10-CM | POA: Diagnosis present

## 2012-01-07 DIAGNOSIS — E785 Hyperlipidemia, unspecified: Secondary | ICD-10-CM | POA: Diagnosis present

## 2012-01-07 DIAGNOSIS — E538 Deficiency of other specified B group vitamins: Secondary | ICD-10-CM | POA: Diagnosis not present

## 2012-01-07 DIAGNOSIS — E119 Type 2 diabetes mellitus without complications: Secondary | ICD-10-CM | POA: Diagnosis not present

## 2012-01-07 DIAGNOSIS — N3281 Overactive bladder: Secondary | ICD-10-CM

## 2012-01-07 DIAGNOSIS — I10 Essential (primary) hypertension: Secondary | ICD-10-CM

## 2012-01-07 DIAGNOSIS — F329 Major depressive disorder, single episode, unspecified: Secondary | ICD-10-CM | POA: Diagnosis not present

## 2012-01-07 DIAGNOSIS — I82409 Acute embolism and thrombosis of unspecified deep veins of unspecified lower extremity: Secondary | ICD-10-CM

## 2012-01-07 DIAGNOSIS — R52 Pain, unspecified: Secondary | ICD-10-CM

## 2012-01-07 DIAGNOSIS — K862 Cyst of pancreas: Secondary | ICD-10-CM

## 2012-01-07 DIAGNOSIS — R197 Diarrhea, unspecified: Secondary | ICD-10-CM

## 2012-01-07 DIAGNOSIS — R5383 Other fatigue: Secondary | ICD-10-CM

## 2012-01-07 DIAGNOSIS — K2900 Acute gastritis without bleeding: Secondary | ICD-10-CM

## 2012-01-07 DIAGNOSIS — R609 Edema, unspecified: Secondary | ICD-10-CM | POA: Diagnosis not present

## 2012-01-07 DIAGNOSIS — Z8719 Personal history of other diseases of the digestive system: Secondary | ICD-10-CM

## 2012-01-07 DIAGNOSIS — K5730 Diverticulosis of large intestine without perforation or abscess without bleeding: Secondary | ICD-10-CM

## 2012-01-07 DIAGNOSIS — K2980 Duodenitis without bleeding: Secondary | ICD-10-CM

## 2012-01-07 DIAGNOSIS — M353 Polymyalgia rheumatica: Secondary | ICD-10-CM

## 2012-01-07 DIAGNOSIS — K222 Esophageal obstruction: Secondary | ICD-10-CM

## 2012-01-07 DIAGNOSIS — M79609 Pain in unspecified limb: Secondary | ICD-10-CM

## 2012-01-07 DIAGNOSIS — I749 Embolism and thrombosis of unspecified artery: Secondary | ICD-10-CM | POA: Diagnosis not present

## 2012-01-07 HISTORY — DX: Acute embolism and thrombosis of unspecified deep veins of left lower extremity: I82.402

## 2012-01-07 HISTORY — DX: Overactive bladder: N32.81

## 2012-01-07 LAB — BASIC METABOLIC PANEL
CO2: 27 mEq/L (ref 19–32)
Calcium: 9.1 mg/dL (ref 8.4–10.5)
Chloride: 102 mEq/L (ref 96–112)
Glucose, Bld: 190 mg/dL — ABNORMAL HIGH (ref 70–99)
Potassium: 4.3 mEq/L (ref 3.5–5.1)
Sodium: 138 mEq/L (ref 135–145)

## 2012-01-07 LAB — PROTIME-INR
INR: 1.09 (ref 0.00–1.49)
Prothrombin Time: 14.3 seconds (ref 11.6–15.2)

## 2012-01-07 LAB — CBC
Hemoglobin: 10.6 g/dL — ABNORMAL LOW (ref 12.0–15.0)
Platelets: 140 10*3/uL — ABNORMAL LOW (ref 150–400)
RBC: 3.6 MIL/uL — ABNORMAL LOW (ref 3.87–5.11)
WBC: 6.1 10*3/uL (ref 4.0–10.5)

## 2012-01-07 LAB — GLUCOSE, CAPILLARY: Glucose-Capillary: 212 mg/dL — ABNORMAL HIGH (ref 70–99)

## 2012-01-07 MED ORDER — SODIUM CHLORIDE 0.9 % IV SOLN
500.0000 mL | INTRAVENOUS | Status: DC
  Administered 2012-01-07: 19:00:00 via INTRAVENOUS

## 2012-01-07 MED ORDER — ENOXAPARIN SODIUM 60 MG/0.6ML ~~LOC~~ SOLN
1.0000 mg/kg | SUBCUTANEOUS | Status: AC
  Administered 2012-01-07: 60 mg via SUBCUTANEOUS
  Filled 2012-01-07: qty 0.6

## 2012-01-07 MED ORDER — ENOXAPARIN SODIUM 60 MG/0.6ML ~~LOC~~ SOLN
1.0000 mg/kg | SUBCUTANEOUS | Status: DC
  Filled 2012-01-07: qty 0.6

## 2012-01-07 MED ORDER — DARIFENACIN HYDROBROMIDE ER 7.5 MG PO TB24
7.5000 mg | ORAL_TABLET | Freq: Every day | ORAL | Status: DC
  Administered 2012-01-07 – 2012-01-09 (×3): 7.5 mg via ORAL
  Filled 2012-01-07 (×4): qty 1

## 2012-01-07 MED ORDER — ROPINIROLE HCL 1 MG PO TABS
2.0000 mg | ORAL_TABLET | Freq: Every day | ORAL | Status: DC
  Administered 2012-01-07 – 2012-01-08 (×2): 2 mg via ORAL
  Filled 2012-01-07 (×3): qty 2

## 2012-01-07 MED ORDER — DULOXETINE HCL 30 MG PO CPEP
30.0000 mg | ORAL_CAPSULE | Freq: Every day | ORAL | Status: DC
  Administered 2012-01-08 – 2012-01-09 (×2): 30 mg via ORAL
  Filled 2012-01-07 (×2): qty 1

## 2012-01-07 MED ORDER — ONDANSETRON HCL 4 MG PO TABS: 4.0000 mg | ORAL_TABLET | Freq: Four times a day (QID) | ORAL | Status: DC | PRN

## 2012-01-07 MED ORDER — ADULT MULTIVITAMIN W/MINERALS CH: 1.0000 | ORAL_TABLET | ORAL | Status: DC

## 2012-01-07 MED ORDER — ACETAMINOPHEN 650 MG RE SUPP: 650.0000 mg | Freq: Four times a day (QID) | RECTAL | Status: DC | PRN

## 2012-01-07 MED ORDER — POLYETHYLENE GLYCOL 3350 17 G PO PACK
17.0000 g | PACK | Freq: Every day | ORAL | Status: DC | PRN
  Filled 2012-01-07: qty 1

## 2012-01-07 MED ORDER — ENOXAPARIN SODIUM 100 MG/ML ~~LOC~~ SOLN
1.5000 mg/kg | SUBCUTANEOUS | Status: DC
  Filled 2012-01-07: qty 1

## 2012-01-07 MED ORDER — INSULIN ASPART 100 UNIT/ML ~~LOC~~ SOLN
0.0000 [IU] | Freq: Three times a day (TID) | SUBCUTANEOUS | Status: DC
  Administered 2012-01-08: 1 [IU] via SUBCUTANEOUS
  Filled 2012-01-07: qty 3

## 2012-01-07 MED ORDER — ACETAMINOPHEN 325 MG PO TABS
650.0000 mg | ORAL_TABLET | Freq: Four times a day (QID) | ORAL | Status: DC | PRN
  Administered 2012-01-08: 650 mg via ORAL
  Filled 2012-01-07: qty 2

## 2012-01-07 MED ORDER — ENOXAPARIN SODIUM 100 MG/ML ~~LOC~~ SOLN
1.5000 mg/kg | SUBCUTANEOUS | Status: AC
  Administered 2012-01-08: 90 mg via SUBCUTANEOUS
  Filled 2012-01-07: qty 1

## 2012-01-07 MED ORDER — ONDANSETRON HCL 4 MG/2ML IJ SOLN: 4.0000 mg | Freq: Four times a day (QID) | INTRAMUSCULAR | Status: DC | PRN

## 2012-01-07 MED ORDER — WARFARIN SODIUM 5 MG PO TABS
5.0000 mg | ORAL_TABLET | Freq: Once | ORAL | Status: AC
  Administered 2012-01-07: 5 mg via ORAL
  Filled 2012-01-07: qty 1

## 2012-01-07 MED ORDER — ALUM & MAG HYDROXIDE-SIMETH 200-200-20 MG/5ML PO SUSP: 30.0000 mL | Freq: Four times a day (QID) | ORAL | Status: DC | PRN

## 2012-01-07 NOTE — Progress Notes (Signed)
Attempted IV x1 and unable. Vein blew. IV team notified.

## 2012-01-07 NOTE — H&P (Signed)
History and Physical Examination  Date: 01/07/2012  Patient name: [name] Medical record number: [id number] Date of birth: Feb 17, 1919 Age: 78 y.o. Gender: female PCP: Benito Mccreedy, MD, MD  Attending physician: Lelon Frohlich,*  Chief Complaint: Acute DVT left leg    History of Present Illness: [name] is an 78 y.o. female was sent to the vascular lab today after being seen by her primary care physician with complaints of left leg swelling and pain.  The patient was found to have an acute DVT involving the left leg.  The patient's primary care physician called the [facility] to request a direct admission.  The patient denies having shortness of breath and chest pain.  The patient reports that she has been fairly active.  She has restless leg syndrome.  She reports no other symptoms at this time.  The patient's primary care physician was concerned about outpatient anticoagulation initiation given the patient's advanced age and other comorbidities.  Therefore hospitalization was requested.  Past Medical History Past Medical History  Diagnosis Date  . Cyst and pseudocyst of pancreas   . Unspecified sleep apnea   . Uncontrolled hypertension as indication for native nephrectomy   . Other and unspecified hyperlipidemia   . Polymyalgia rheumatica   . Personal history of unspecified digestive disease   . Restless legs syndrome (RLS)   . Insomnia, unspecified   . Other malaise and fatigue   . Diverticulosis of colon (without mention of hemorrhage)   . Acute gastritis without mention of hemorrhage   . Duodenitis without mention of hemorrhage   . Diaphragmatic hernia without mention of obstruction or gangrene   . Esophageal reflux   . Stricture and stenosis of esophagus   . OAB (overactive bladder)   . Left leg DVT        Chronic Pain in the Lower Spine   Past Surgical History Past Surgical History  Procedure Date  . Oophorectomy   . Rotator  cuff repair   . Back surgery   . Appendectomy   . Eye surgery     cataracts removed-bilateral eyes    Home Meds: Prior to Admission medications   Medication Sig Start Date End Date Taking? Authorizing Provider  DULoxetine (CYMBALTA) 30 MG capsule Take 30 mg by mouth daily.     Yes Historical Provider, MD  aspirin 81 MG tablet Take 81 mg by mouth daily.      Historical Provider, MD  AVELOX 400 MG tablet  12/31/10   Historical Provider, MD  buPROPion (WELLBUTRIN SR) 150 MG 12 hr tablet Take 150 mg by mouth daily.      Historical Provider, MD  chlorpheniramine-HYDROcodone ([place]) 10-8 MG/5ML [facility]  12/31/10   Historical Provider, MD  Cyanocobalamin 1000 MCG/15ML LIQD Take 1,000 mcg by mouth every 30 (thirty) days.      Historical Provider, MD  darifenacin (ENABLEX) 7.5 MG 24 hr tablet Take 7.5 mg by mouth daily. 1 in the morning and 1 at bedtime     Historical Provider, MD  diphenoxylate-atropine (LOMOTIL) 2.5-0.025 MG per tablet Take 1-2 tablets by mouth 2 (two) times daily as needed.      Historical Provider, MD  ergocalciferol (VITAMIN D2) 50000 UNITS capsule Take 50,000 Units by mouth 2 (two) times a week.      Historical Provider, MD       Historical Provider, MD         metFORMIN (GLUCOPHAGE) 1000 MG tablet Take 1,000 mg by mouth 2 (  two) times daily with a meal.      Historical Provider, MD  mirtazapine (REMERON) 15 MG tablet Take 15 mg by mouth at bedtime.      Historical Provider, MD  oxyCODONE-acetaminophen (PERCOCET) 5-325 MG per tablet Take 1 tablet by mouth 2 (two) times daily as needed. For pain    Historical Provider, MD  pantoprazole (PROTONIX) 40 MG tablet Take 40 mg by mouth daily.      Historical Provider, MD  Probiotic Product (ALIGN) 4 MG CAPS Take 1 capsule by mouth daily.      Historical Provider, MD  rOPINIRole (REQUIP) 1 MG tablet Take 1 mg by mouth.      Historical Provider, MD  rOPINIRole (REQUIP) 2 MG tablet Take 2 mg by mouth at bedtime as needed. For stiffness  and nightly muscle spasm    Historical Provider, MD    Allergies: Sulfa antibiotics and Penicillins  Social History:  History   Social History  . Marital Status: Widowed    Spouse Name: N/A    Number of Children: 2  . Years of Education: N/A   Occupational History  . Retired but active     Social History Main Topics  . Smoking status: Never Smoker   . Smokeless tobacco: Never Used  . Alcohol Use: No  . Drug Use: No  . Sexually Active: No   Other Topics Concern  . Not on file   Social History Narrative  . No narrative on file   Family History:  Family History  Problem Relation Age of Onset  . Melanoma Daughter     died age 71  . Melanoma Son     Review of Systems: Pertinent items are noted in HPI. All other systems reviewed and reported as negative.   Physical Exam: Blood pressure 150/70, pulse 67, temperature 97.5 F (36.4 C), temperature source Oral, resp. rate 16, height 5' 2"  (1.575 m), weight 59.3 kg (130 lb 11.7 oz), SpO2 97.00%. BP 150/70  Pulse 67  Temp(Src) 97.5 F (36.4 C) (Oral)  Resp 16  Ht 5' 2"  (1.575 m)  Wt 59.3 kg (130 lb 11.7 oz)  BMI 23.91 kg/m2  SpO2 97%  General Appearance:    Alert, cooperative, no distress, appears stated age  Head:    Normocephalic, without obvious abnormality, atraumatic  Eyes:    PERRL, conjunctiva/corneas clear, EOM's intact  Ears:    deferred   Nose:   Nares normal, septum midline, mucosa normal, no drainage    or sinus tenderness  Throat:   Lips, mucosa, and tongue normal;   Neck:   Supple, symmetrical, trachea midline, no adenopathy;    thyroid:  no enlargement/tenderness/nodules; no carotid   bruit or JVD  Back:     Tenderness to palpation   Lungs:     Clear to auscultation bilaterally, respirations unlabored  Chest Wall:    No tenderness or deformity   Heart:    S1 and S2 normal, no murmur, rub   or gallop     Abdomen:     Soft, non-tender, bowel sounds active all four quadrants,    no masses, no  organomegaly        Extremities:   edema of the left lower extremity and calf tenderness   Pulses:   2+ and symmetric all extremities  Skin:   Skin color, texture, turgor normal, no rashes or lesions     Neurologic:   CNII-XII intact, normal strength, sensation and reflexes  throughout    Lab  And Imaging results:  I reviewed the report regarding the DVT results  EKG Results:  No orders found for this or any previous visit.   Impression   Acute DVT of left leg (deep venous thrombosis)  HYPERLIPIDEMIA  RESTLESS LEG SYNDROME  HYPERTENSION  GERD  INSOMNIA  SLEEP APNEA  FATIGUE  CROHN'S DISEASE, HX OF  Overactive bladder  Chronic Pain of Spine   Diabetes Mellitus, type 2   Plan  Admit to telemetry.  Start anticoagulation with lovenox per pharmacy.  Warfarin per pharmacy.  At this time, pt continues to be active and has not had any recent episodes of falling down.  Will go ahead and proceed with starting coumadin at this time with the understanding that it may need to be discontinued if patient becomes a fall risk.  Resume home meds for other chronic medical problems.  Please see orders. Monitor blood glucose.  Hold Metformin while inpatient.  Supplemental insulin scale for glycemic control.  Hypoglycemia precautions.  PT/OT consult.   Warfarin education.   Wel[place], [place] 01/07/2012, 3:48 PM

## 2012-01-07 NOTE — Progress Notes (Signed)
Left: DVT noted coursing from the mid posterior tibial vein through the popliteal and femoral veins and also including the gastrocnemius vein..  No evidence of superficial thrombosis.  No Baker's cyst.    Birdena Crandall, [place] D., RVS 1/30 2013 1:09 PM

## 2012-01-07 NOTE — Progress Notes (Signed)
ANTICOAGULATION CONSULT NOTE - Initial Consult  Pharmacy Consult for Lovenox and Warfarin Indication: DVT, acute  Allergies  Allergen Reactions  . Sulfa Antibiotics Nausea And Vomiting  . Penicillins Hives and Rash    Patient Measurements: Height: 5\' 2"  (157.5 cm) Weight: 130 lb 11.7 oz (59.3 kg) IBW/kg (Calculated) : 50.1   Vital Signs: Temp: 97.5 F (36.4 C) (01/30 1500) Temp src: Oral (01/30 1500) BP: 150/70 mmHg (01/30 1500) Pulse Rate: 67  (01/30 1500)  Labs: No results found for this basename: HGB:2,HCT:3,PLT:3,APTT:3,LABPROT:3,INR:3,HEPARINUNFRC:3,CREATININE:3,CKTOTAL:3,CKMB:3,TROPONINI:3 in the last 72 hours Estimated Creatinine Clearance: 31.1 ml/min (by C-G formula based on Cr of 0.95).  Medical History: Past Medical History  Diagnosis Date  . Cyst and pseudocyst of pancreas   . Unspecified sleep apnea   . Uncontrolled hypertension as indication for native nephrectomy   . Other and unspecified hyperlipidemia   . Polymyalgia rheumatica   . Personal history of unspecified digestive disease   . Restless legs syndrome (RLS)   . Insomnia, unspecified   . Other malaise and fatigue   . Diverticulosis of colon (without mention of hemorrhage)   . Acute gastritis without mention of hemorrhage   . Duodenitis without mention of hemorrhage   . Diaphragmatic hernia without mention of obstruction or gangrene   . Esophageal reflux   . Stricture and stenosis of esophagus   . OAB (overactive bladder)   . Left leg DVT     Medications:  Scheduled:    . darifenacin  7.5 mg Oral Daily  . DULoxetine  30 mg Oral Daily  . enoxaparin (LOVENOX) injection  1.5 mg/kg Subcutaneous Q24H  . insulin aspart  0-9 Units Subcutaneous TID WC  . mulitivitamin with minerals  1 tablet Oral Q Sun  . rOPINIRole  2 mg Oral QHS    Assessment:  78 yo F admit with acute DVT  SCr pending, but renal function appears to have been normal for advanced age  Baseline CMP, CBC, and INR are  ordered  Warfarin points = 2  Goal of Therapy:  INR 2-3   Plan:   Lovenox 60mg  SQ x1 dose at 1900  Lovenox  90 mg SQ once daily starting 1/31  Warfarin 5mg  PO once today  Daily INR, CBC   Gretta Arab PharmD  Pager [phone] 01/07/2012 5:29 PM

## 2012-01-08 DIAGNOSIS — M545 Low back pain: Secondary | ICD-10-CM | POA: Diagnosis not present

## 2012-01-08 DIAGNOSIS — I749 Embolism and thrombosis of unspecified artery: Secondary | ICD-10-CM | POA: Diagnosis not present

## 2012-01-08 DIAGNOSIS — E119 Type 2 diabetes mellitus without complications: Secondary | ICD-10-CM | POA: Diagnosis not present

## 2012-01-08 DIAGNOSIS — G2581 Restless legs syndrome: Secondary | ICD-10-CM | POA: Diagnosis not present

## 2012-01-08 LAB — CBC
HCT: 35.3 % — ABNORMAL LOW (ref 36.0–46.0)
MCHC: 32.6 g/dL (ref 30.0–36.0)
Platelets: 139 10*3/uL — ABNORMAL LOW (ref 150–400)
RDW: 14 % (ref 11.5–15.5)
WBC: 6.4 10*3/uL (ref 4.0–10.5)

## 2012-01-08 LAB — COMPREHENSIVE METABOLIC PANEL
Alkaline Phosphatase: 90 U/L (ref 39–117)
BUN: 27 mg/dL — ABNORMAL HIGH (ref 6–23)
CO2: 28 mEq/L (ref 19–32)
Chloride: 102 mEq/L (ref 96–112)
GFR calc Af Amer: 83 mL/min — ABNORMAL LOW (ref >90)
Glucose, Bld: 115 mg/dL — ABNORMAL HIGH (ref 70–99)
Potassium: 4.5 mEq/L (ref 3.5–5.1)
Total Bilirubin: 0.3 mg/dL (ref 0.3–1.2)

## 2012-01-08 LAB — GLUCOSE, CAPILLARY
Glucose-Capillary: 93 mg/dL (ref 70–99)
Glucose-Capillary: 96 mg/dL (ref 70–99)

## 2012-01-08 MED ORDER — RIVAROXABAN 15 MG PO TABS
15.0000 mg | ORAL_TABLET | Freq: Every day | ORAL | Status: DC
  Administered 2012-01-09: 15 mg via ORAL
  Filled 2012-01-08: qty 1

## 2012-01-08 MED ORDER — LIDOCAINE 5 % EX PTCH
1.0000 | MEDICATED_PATCH | CUTANEOUS | Status: DC
  Administered 2012-01-08: 1 via TRANSDERMAL
  Filled 2012-01-08 (×3): qty 1

## 2012-01-08 MED ORDER — HYDROCODONE-ACETAMINOPHEN 5-325 MG PO TABS
1.0000 | ORAL_TABLET | Freq: Four times a day (QID) | ORAL | Status: DC | PRN
  Administered 2012-01-08: 1 via ORAL
  Filled 2012-01-08: qty 1

## 2012-01-08 MED ORDER — WARFARIN SODIUM 5 MG PO TABS
5.0000 mg | ORAL_TABLET | Freq: Once | ORAL | Status: DC
  Filled 2012-01-08: qty 1

## 2012-01-08 NOTE — Progress Notes (Signed)
CARE MANAGEMENT NOTE 01/08/2012  Patient:  [name], [name]   Account Number:  [id number]  Date Initiated:  01/08/2012  Documentation initiated by:  Rucker Pridgeon  Subjective/Objective Assessment:   pt with confirmed dvt of the lower leg and pain, hx of falls     Action/Plan:   lives at home   Anticipated DC Date:  01/11/2012   Anticipated DC Plan:  HOME/SELF CARE  In-house referral  NA      DC Planning Services  NA      [facility] Choice  NA   Choice offered to / List presented to:  NA   DME arranged  NA      DME agency  NA     [place] arranged  NA      [place] agency  NA   Status of service:  In process, will continue to follow Medicare Important Message given?  YES (If response is "NO", the following Medicare IM given date fields will be blank) Date Medicare IM given:   Date Additional Medicare IM given:    Discharge Disposition:    Per UR Regulation:  Reviewed for med. necessity/level of care/duration of stay  Comments:  [id number]/Pavlos Yon,RN,BSN,CCM

## 2012-01-08 NOTE — Evaluation (Signed)
Occupational Therapy Evaluation Patient Details Name: [name] MRN: [id number] DOB: Apr 22, 1919 Today's Date: 01/08/2012 EV2 [id number] Problem List:  Patient Active Problem List  Diagnoses  . HYPERLIPIDEMIA  . RESTLESS LEG SYNDROME  . HYPERTENSION  . ESOPHAGEAL STRICTURE  . GERD  . GASTRITIS, ACUTE  . DUODENITIS  . HIATAL HERNIA  . DIVERTICULOSIS, COLON  . CYST AND PSEUDOCYST OF PANCREAS  . POLYMYALGIA RHEUMATICA  . INSOMNIA  . SLEEP APNEA  . FATIGUE  . DIARRHEA  . CROHN'S DISEASE, HX OF  . Overactive bladder  . DVT of leg (deep venous thrombosis)    Past Medical History:  Past Medical History  Diagnosis Date  . Cyst and pseudocyst of pancreas   . Unspecified sleep apnea   . Unspecified essential hypertension   . Other and unspecified hyperlipidemia   . Polymyalgia rheumatica   . Personal history of unspecified digestive disease   . Restless legs syndrome (RLS)   . Insomnia, unspecified   . Other malaise and fatigue   . Diverticulosis of colon (without mention of hemorrhage)   . Acute gastritis without mention of hemorrhage   . Duodenitis without mention of hemorrhage   . Diaphragmatic hernia without mention of obstruction or gangrene   . Esophageal reflux   . Stricture and stenosis of esophagus   . OAB (overactive bladder)   . Left leg DVT    Past Surgical History:  Past Surgical History  Procedure Date  . Oophorectomy   . Rotator cuff repair   . Back surgery   . Appendectomy   . Eye surgery     cataracts removed-bilateral eyes    OT Assessment/Plan/Recommendation OT Assessment Clinical Impression Statement: Pt presents w/LLE DVT and is experiencing increased pain with mobility. Skilled OT recommended to maximize I w/BADLs to mod I level for safe d/c home. Pt will likely not need f/u OT. OT Recommendation/Assessment: Patient will need skilled OT in the acute care venue OT Problem List: Decreased activity tolerance;Pain;Decreased safety  awareness OT Therapy Diagnosis : Generalized weakness OT Plan OT Frequency: Min 2X/week OT Treatment/Interventions: Self-care/ADL training;Therapeutic activities;DME and/or AE instruction;Patient/family education OT Recommendation Follow Up Recommendations: No OT follow up Equipment Recommended: None recommended by OT Individuals Consulted Consulted and Agree with Results and Recommendations: Patient OT Goals Acute Rehab OT Goals OT Goal Formulation: With patient Time For Goal Achievement: 2 weeks ADL Goals Pt Will Perform Grooming: with modified independence;Standing at sink (X 3 tasks to improve standing activity tolerance.) ADL Goal: Grooming - Progress: Goal set today Pt Will Perform Upper Body Bathing: with modified independence;Standing at sink ADL Goal: Upper Body Bathing - Progress: Goal set today Pt Will Perform Lower Body Bathing: with modified independence;Sit to stand from chair;Sit to stand from bed ADL Goal: Lower Body Bathing - Progress: Goal set today Pt Will Transfer to Toilet: with modified independence;Regular height toilet;Ambulation ADL Goal: Toilet Transfer - Progress: Goal set today Pt Will Perform Toileting - Clothing Manipulation: with modified independence;Standing ADL Goal: Toileting - Clothing Manipulation - Progress: Goal set today Pt Will Perform Toileting - Hygiene: with modified independence;Sit to stand from 3-in-1/toilet ADL Goal: Toileting - Hygiene - Progress: Goal set today  OT Evaluation Precautions/Restrictions  Restrictions Weight Bearing Restrictions: No Prior Functioning Home Living Lives With: Alone Receives Help From:  (housekeeper) Type of Home: House (condo) Home Layout: One level Home Access: Stairs to enter Entrance Stairs-Rails: Can reach both Entrance Stairs-Number of Steps: 5 Bathroom Shower/Tub: Chiropodist: Handicapped height Home  Adaptive Equipment: Raised toilet seat with rails;Straight cane;Tub  transfer bench (rollator) Additional Comments: grab near shower Prior Function Level of Independence: Independent with basic ADLs;Independent with homemaking with ambulation Driving: Yes Comments: Pt uses straight cane for mobility. ADL ADL Grooming: Performed;Wash/dry hands;Supervision/safety Where Assessed - Grooming: Standing at sink Upper Body Bathing: Simulated;Supervision/safety Where Assessed - Upper Body Bathing: Standing at sink Lower Body Bathing: Simulated;Supervision/safety Where Assessed - Lower Body Bathing: Sit to stand from bed Upper Body Dressing: Performed;Supervision/safety Where Assessed - Upper Body Dressing: Standing Lower Body Dressing: Performed;Supervision/safety Where Assessed - Lower Body Dressing: Lean right and/or left;Sitting, bed;Sit to stand from bed;Unsupported Toilet Transfer: Performed;Supervision/safety Toilet Transfer Method: Counselling psychologist: Regular height toilet;Grab bars Toileting - Clothing Manipulation: Simulated;Supervision/safety Where Assessed - Camera operator Manipulation: Sit to stand from 3-in-1 or toilet Toileting - Hygiene: Simulated;Supervision/safety Where Assessed - Toileting Hygiene: Sit to stand from 3-in-1 or toilet Tub/Shower Transfer Method: Not assessed Ambulation Related to ADLs: Pt began to have LLE pain after ambulating. Required RB. Elevated w/pillow. Nursing made aware. Vision/Perception    Cognition Cognition Arousal/Alertness: Awake/alert Overall Cognitive Status: Appears within functional limits for tasks assessed Sensation/Coordination   Extremity Assessment RUE Assessment RUE Assessment: Exceptions to [facility] RUE AROM (degrees) Overall AROM Right Upper Extremity: Deficits;Due to premorbid status (~60 degrees shoulder flex. WFL distally.) RUE Strength RUE Overall Strength: Within Functional Limits for tasks performed LUE Assessment LUE Assessment: Within Functional Limits Mobility   Bed Mobility Bed Mobility: Yes Supine to Sit: 6: Modified independent (Device/Increase time);With rails Transfers Transfers: Yes Sit to Stand: 5: Supervision;From bed;From toilet;With upper extremity assist Sit to Stand Details (indicate cue type and reason): Occasional VCs to take RW w/ her, hand placement. Stand to Sit: 5: Supervision;With upper extremity assist;To chair/3-in-1;To toilet;With armrests Stand to Sit Details: Occasional VCs to take RW w/ her, hand placement. Exercises   End of Session OT - End of Session Equipment Utilized During Treatment: Other (comment) (RW) Activity Tolerance: Patient limited by pain Patient left: in chair;with call bell in reach Nurse Communication: Other (comment) (need for pain meds.) General Behavior During Session: [facility] for tasks performed Cognition: [facility] for tasks performed   Walton Digilio A, OTR/L [phone] 01/08/2012, 11:29 AM

## 2012-01-08 NOTE — Progress Notes (Signed)
ANTICOAGULATION CONSULT NOTE - Follow Up Consult  Pharmacy Consult for Lovenox/Coumadin Indication: DVT  Allergies  Allergen Reactions  . Sulfa Antibiotics Nausea And Vomiting  . Penicillins Hives and Rash    Patient Measurements: Height: 5\' 2"  (157.5 cm) Weight: 130 lb 11.7 oz (59.3 kg) IBW/kg (Calculated) : 50.1   Vital Signs: Temp: 98.1 F (36.7 C) (01/31 0619) Temp src: Oral (01/31 0619) BP: 147/65 mmHg (01/31 0619) Pulse Rate: 53  (01/31 0619)  Labs:  Basename 01/08/12 0500 01/08/12 0405 01/07/12 2120  HGB 11.5* -- 10.6*  HCT 35.3* -- 32.7*  PLT 139* -- 140*  APTT -- -- --  LABPROT -- 13.8 14.3  INR -- 1.04 1.09  HEPARINUNFRC -- -- --  CREATININE -- 0.78 0.87  CKTOTAL -- -- --  CKMB -- -- --  TROPONINI -- -- --   Estimated Creatinine Clearance: 37 ml/min (by C-G formula based on Cr of 0.78).   Assessment:  66 YOM with acute L leg DVT admitted to start anticoagulation given age and co-morbidities.  Today's D2 of 5 days minimum Lovenox/Coumadin overlap.  Lovenox 90 sq once daily (1.5 mg/kg)  CBC stable (Plt 139K).  CrCl 37 ml/min.  No bleeding/complications noted.  Coumadin score = 2  Goal of Therapy:  INR 2-3   Plan:   Repeat Coumadin 5 mg po x 1  Continue Lovenox 1.5 mg/kg (70 mg) sq daily  Pharmacy will f/u PT/INR daily  Vanessa Mora Thi 01/08/2012,12:44 PM

## 2012-01-08 NOTE — Progress Notes (Signed)
Physical Therapy Treatment Patient Details Name: [name] MRN: [id number] DOB: 1919-11-04 Today's Date: 01/08/2012 Time: [id number] Charge: Elliot Cousin PT Assessment/Plan  PT - Assessment/Plan Comments on Treatment Session: Pt with possible d/c home today so returned to practice stairs.  Pt reports increased LBP since sitting up in chair and had MD appt for back on Tues, who prescribed patches for pt and she currently does not have with her.  Pt also reports she would prefer to d/c tomorrow so son is able to take her home (he cannot today 2* taking his wife to [facility] for dr appt).  Pt educated on pillow positioning in bed to decrease back pain and exercises to perform at home. PT Plan: Discharge plan remains appropriate;Frequency remains appropriate PT Frequency: Min 3X/week Follow Up Recommendations: No PT follow up Equipment Recommended: None recommended by PT PT Goals  Acute Rehab PT Goals PT Goal Formulation: With patient Time For Goal Achievement: 7 days Pt will Ambulate: with modified independence;>150 feet;with least restrictive assistive device PT Goal: Ambulate - Progress: Progressing toward goal Pt will Go Up / Down Stairs: 3-5 stairs;with rail(s);with modified independence;with least restrictive assistive device PT Goal: Up/Down Stairs - Progress: Progressing toward goal Pt will Perform Home Exercise Program: with supervision, verbal cues required/provided PT Goal: Perform Home Exercise Program - Progress: Progressing toward goal  PT Treatment Precautions/Restrictions  Restrictions Weight Bearing Restrictions: No Mobility (including Balance) Bed Mobility Bed Mobility: Yes Supine to Sit: 6: Modified independent (Device/Increase time) (per OT, pt sitting EOB on arrival) Sit to Supine: 6: Modified independent (Device/Increase time) Transfers Transfers: Yes Sit to Stand: 5: Supervision Sit to Stand Details (indicate cue type and reason): verbal cues for hand  placement Stand to Sit: 5: Supervision Stand to Sit Details: verbal cue to bring RW back to recliner and use of armrests Ambulation/Gait Ambulation/Gait: Yes Ambulation/Gait Assistance: 5: Supervision Ambulation/Gait Assistance Details (indicate cue type and reason): pt ambulated to bathroom with 1 HHA and demonstrates unsteady gait but no LOB so given RW to use for ambulation.  Pt agreed to use RW upon return home. Ambulation Distance (Feet): 50 Feet Assistive device: Rolling walker Gait Pattern: Antalgic Stairs: Yes Stairs Assistance: 4: Min assist Stairs Assistance Details (indicate cue type and reason): min/guard for safety 2* pt reporting increased back pain from sitting in chair Stair Management Technique: Two rails;Step to pattern Number of Stairs: 2  (performed x2)  Balance Balance Assessed: No (Pt reports no falls in last 3 years.) Exercise  Other Exercises Other Exercises: Discussed and demonstrated HEP for pt.  Handout to be given prior to d/c. End of Session PT - End of Session Equipment Utilized During Treatment: Gait belt Activity Tolerance: Patient limited by fatigue Patient left: in bed;with call bell in reach General Behavior During Session: [facility] for tasks performed Cognition: [facility] for tasks performed  Euretha Najarro,KATHrine E 01/08/2012, 2:56 PM Pager: [phone]

## 2012-01-08 NOTE — Evaluation (Signed)
Physical Therapy Evaluation Patient Details Name: [name] MRN: [id number] DOB: 1919-02-27 Today's Date: 01/08/2012 Time: ID:[id number] Charge: EVII  Problem List:  Patient Active Problem List  Diagnoses  . HYPERLIPIDEMIA  . RESTLESS LEG SYNDROME  . HYPERTENSION  . ESOPHAGEAL STRICTURE  . GERD  . GASTRITIS, ACUTE  . DUODENITIS  . HIATAL HERNIA  . DIVERTICULOSIS, COLON  . CYST AND PSEUDOCYST OF PANCREAS  . POLYMYALGIA RHEUMATICA  . INSOMNIA  . SLEEP APNEA  . FATIGUE  . DIARRHEA  . CROHN'S DISEASE, HX OF  . Overactive bladder  . DVT of leg (deep venous thrombosis)    Past Medical History:  Past Medical History  Diagnosis Date  . Cyst and pseudocyst of pancreas   . Unspecified sleep apnea   . Unspecified essential hypertension   . Other and unspecified hyperlipidemia   . Polymyalgia rheumatica   . Personal history of unspecified digestive disease   . Restless legs syndrome (RLS)   . Insomnia, unspecified   . Other malaise and fatigue   . Diverticulosis of colon (without mention of hemorrhage)   . Acute gastritis without mention of hemorrhage   . Duodenitis without mention of hemorrhage   . Diaphragmatic hernia without mention of obstruction or gangrene   . Esophageal reflux   . Stricture and stenosis of esophagus   . OAB (overactive bladder)   . Left leg DVT    Past Surgical History:  Past Surgical History  Procedure Date  . Oophorectomy   . Rotator cuff repair   . Back surgery   . Appendectomy   . Eye surgery     cataracts removed-bilateral eyes    PT Assessment/Plan/Recommendation PT Assessment Clinical Impression Statement: Pt admitted for L LE DVT.  Pt would benefit from acute PT services in order to decrease pain and improve mobility, ambulation, and be able to perform stairs prior to d/c home alone.  Pt c/o increased pain L LE pain with mobility which is limiting ambulation at this time.  Pt will likely progress well and need require f/u needs. PT  Recommendation/Assessment: Patient will need skilled PT in the acute care venue PT Problem List: Decreased activity tolerance;Decreased mobility;Pain PT Therapy Diagnosis : Difficulty walking;Acute pain PT Plan PT Frequency: Min 3X/week PT Treatment/Interventions: DME instruction;Gait training;Stair training;Functional mobility training;Therapeutic exercise;Therapeutic activities;Patient/family education PT Recommendation Recommendations for Other Services:  (seen with OT) Equipment Recommended: None recommended by PT PT Goals  Acute Rehab PT Goals PT Goal Formulation: With patient Time For Goal Achievement: 7 days Pt will Ambulate: with modified independence;>150 feet;with least restrictive assistive device PT Goal: Ambulate - Progress: Goal set today Pt will Go Up / Down Stairs: 3-5 stairs;with rail(s);with modified independence;with least restrictive assistive device PT Goal: Up/Down Stairs - Progress: Goal set today Pt will Perform Home Exercise Program: with supervision, verbal cues required/provided PT Goal: Perform Home Exercise Program - Progress: Goal set today  PT Evaluation Precautions/Restrictions    Prior Functioning  Home Living Lives With: Alone Receives Help From:  (housekeeper) Type of Home: House (condo) Home Layout: One level Home Access: Stairs to enter Entrance Stairs-Rails: Can reach both Entrance Stairs-Number of Steps: 5 Bathroom Shower/Tub: Chiropodist: Handicapped height Home Adaptive Equipment: Raised toilet seat with rails;Straight cane;Tub transfer bench (rollator) Additional Comments: grab near shower Prior Function Level of Independence: Independent with basic ADLs;Independent with homemaking with ambulation Driving: Yes Comments: Pt uses straight cane for mobility. Cognition Cognition Arousal/Alertness: Awake/alert Overall Cognitive Status: Appears within functional  limits for tasks assessed Sensation/Coordination     Extremity Assessment RUE Assessment RUE Assessment: Exceptions to [facility] RUE AROM (degrees) Overall AROM Right Upper Extremity: Deficits;Due to premorbid status (~60 degrees shoulder flex. WFL distally.) RUE Strength RUE Overall Strength: Within Functional Limits for tasks performed LUE Assessment LUE Assessment: Within Functional Limits RLE Assessment RLE Assessment: Within Functional Limits (per observation) LLE Assessment LLE Assessment: Not tested (2* increased L LE pain (DVT), lower leg edema) Mobility (including Balance) Bed Mobility Bed Mobility: Yes Supine to Sit: 6: Modified independent (Device/Increase time) (per OT, pt sitting EOB on arrival) Transfers Transfers: Yes Sit to Stand: 5: Supervision;With upper extremity assist;From bed;From toilet Sit to Stand Details (indicate cue type and reason): verbal cues for hand placement Stand to Sit: 5: Supervision;With upper extremity assist;To toilet;To chair/3-in-1 Stand to Sit Details: verbal cue to bring RW back to recliner and use of armrests Ambulation/Gait Ambulation/Gait: Yes Ambulation/Gait Assistance: 5: Supervision Ambulation/Gait Assistance Details (indicate cue type and reason): verbal cue for safe RW distance, pt starting c/o L leg pain so limited ambulation distance, verbal cues to WB through UEs to assist with decreasing pain in L LE Ambulation Distance (Feet): 35 Feet Assistive device: Rolling walker Gait Pattern: Antalgic  Balance Balance Assessed: No (Pt reports no falls in last 3 years.) Exercise    End of Session PT - End of Session Activity Tolerance: Patient limited by pain Patient left: in chair;with call bell in reach General Behavior During Session: [facility] for tasks performed Cognition: [facility] for tasks performed  [facility] E 01/08/2012, 12:33 PM Pager: [phone]

## 2012-01-08 NOTE — Progress Notes (Addendum)
Subjective: Patient is doing fairly well today. Has no complaints other than some mild left lower extremity pain.  Objective: Vital signs in last 24 hours: Temp:  [97.5 F (36.4 C)-98.3 F (36.8 C)] 98 F (36.7 C) (01/31 1357) Pulse Rate:  [53-67] 65  (01/31 1357) Resp:  [16-18] 16  (01/31 1357) BP: (125-150)/(60-75) 147/75 mmHg (01/31 1357) SpO2:  [95 %-97 %] 95 % (01/31 1357) Weight:  [59.3 kg (130 lb 11.7 oz)] 59.3 kg (130 lb 11.7 oz) (01/30 1500) Weight change:  Last BM Date: 01/07/12  Intake/Output from previous day: 01/30 0701 - 01/31 0700 In: 239.7 [I.V.:239.7] Out: 625 [Urine:625] Total I/O In: -  Out: 202 [Urine:202]   Physical Exam: General: Alert, awake, oriented x3, in no acute distress. HEENT: No bruits, no goiter. Heart: Regular rate and rhythm, without murmurs, rubs, gallops. Lungs: Clear to auscultation bilaterally. Abdomen: Soft, nontender, nondistended, positive bowel sounds. Extremities: Left lower extremity edema of the calf, positive Homans sign. Neuro: Grossly intact, nonfocal.    Lab Results: Basic Metabolic Panel:  Basename 01/08/12 0405 01/07/12 2120  NA 136 138  K 4.5 4.3  CL 102 102  CO2 28 27  GLUCOSE 115* 190*  BUN 27* 28*  CREATININE 0.78 0.87  CALCIUM 9.2 9.1  MG -- --  PHOS -- --   Liver Function Tests:  [facility] 01/08/12 0405  AST 19  ALT 15  ALKPHOS 90  BILITOT 0.3  PROT 5.7*  ALBUMIN 2.9*   CBC:  Basename 01/08/12 0500 01/07/12 2120  WBC 6.4 6.1  NEUTROABS -- --  HGB 11.5* 10.6*  HCT 35.3* 32.7*  MCV 91.7 90.8  PLT 139* 140*   CBG:  Basename 01/08/12 1133 01/08/12 0716 01/07/12 2120  GLUCAP 130* 93 212*   Hemoglobin A1C:  Basename 01/07/12 1700  HGBA1C 6.6*   Coagulation:  Basename 01/08/12 0405 01/07/12 2120  LABPROT 13.8 14.3  INR 1.04 1.09    Studies/Results: No results found.  Medications: Scheduled Meds:   . darifenacin  7.5 mg Oral Daily  . DULoxetine  30 mg Oral Daily  .  enoxaparin (LOVENOX) injection  1 mg/kg Subcutaneous Q24H  . enoxaparin (LOVENOX) injection  1.5 mg/kg Subcutaneous Q24H  . insulin aspart  0-9 Units Subcutaneous TID WC  . mulitivitamin with minerals  1 tablet Oral Q Sun  . rOPINIRole  2 mg Oral QHS  . warfarin  5 mg Oral Once  . warfarin  5 mg Oral ONCE-1800  . DISCONTD: enoxaparin (LOVENOX) injection  1 mg/kg Subcutaneous Q24H  . DISCONTD: enoxaparin (LOVENOX) injection  1.5 mg/kg Subcutaneous Q24H   Continuous Infusions:   . sodium chloride 20 mL/hr at 01/07/12 1842   PRN Meds:.acetaminophen, acetaminophen, alum & mag hydroxide-simeth, ondansetron (ZOFRAN) IV, ondansetron, polyethylene glycol  Assessment/Plan:  Principal Problem:  *DVT of leg (deep venous thrombosis) Active Problems:  HYPERLIPIDEMIA  RESTLESS LEG SYNDROME  HYPERTENSION  GERD  INSOMNIA  SLEEP APNEA  FATIGUE  CROHN'S DISEASE, HX OF  Overactive bladder   #1 left lower extremity DVT: Patient has been started on Lovenox. I will transition her over to [place] today given the ease of administration. 15 mg BID for 3 weeks then 20 mg daily for at least 6 months. Patient should be ready for discharge home either this afternoon or tomorrow morning. She would like me to discuss her case with her son prior to making a decision.   LOS: 1 day   [place] [facility] Pager: [phone] 01/08/2012, 2:01 PM

## 2012-01-09 DIAGNOSIS — E119 Type 2 diabetes mellitus without complications: Secondary | ICD-10-CM | POA: Diagnosis not present

## 2012-01-09 DIAGNOSIS — G2581 Restless legs syndrome: Secondary | ICD-10-CM | POA: Diagnosis not present

## 2012-01-09 DIAGNOSIS — I749 Embolism and thrombosis of unspecified artery: Secondary | ICD-10-CM | POA: Diagnosis not present

## 2012-01-09 DIAGNOSIS — M545 Low back pain: Secondary | ICD-10-CM | POA: Diagnosis not present

## 2012-01-09 LAB — CBC
HCT: 34.2 % — ABNORMAL LOW (ref 36.0–46.0)
Hemoglobin: 11 g/dL — ABNORMAL LOW (ref 12.0–15.0)
MCHC: 32.2 g/dL (ref 30.0–36.0)
MCV: 90.2 fL (ref 78.0–100.0)
RDW: 13.7 % (ref 11.5–15.5)

## 2012-01-09 LAB — PROTIME-INR: INR: 1.29 (ref 0.00–1.49)

## 2012-01-09 LAB — GLUCOSE, CAPILLARY: Glucose-Capillary: 107 mg/dL — ABNORMAL HIGH (ref 70–99)

## 2012-01-09 MED ORDER — RIVAROXABAN 15 MG PO TABS: 15.0000 mg | ORAL_TABLET | Freq: Every day | ORAL | Status: DC

## 2012-01-09 NOTE — Discharge Summary (Signed)
Physician Discharge Summary  Patient ID: [name] MRN: [id number] DOB/AGE: [id number] 77 y.o.  Admit date: 01/07/2012 Discharge date: 01/09/2012  Primary Care Physician:  Benito Mccreedy, MD, MD   Discharge Diagnoses:    Principal Problem:  *DVT of leg (deep venous thrombosis) Active Problems:  HYPERLIPIDEMIA  RESTLESS LEG SYNDROME  HYPERTENSION  GERD  INSOMNIA  SLEEP APNEA  FATIGUE  CROHN'S DISEASE, HX OF  Overactive bladder    Medication List  As of 01/09/2012  3:04 PM   STOP taking these medications         levofloxacin 750 MG tablet         TAKE these medications         DULoxetine 30 MG capsule   Commonly known as: CYMBALTA   Take 30 mg by mouth daily.      metFORMIN 1000 MG tablet   Commonly known as: GLUCOPHAGE   Take 1,000 mg by mouth 2 (two) times daily with a meal.      mulitivitamin with minerals Tabs   Take 1 tablet by mouth every Sunday. And Wednesday      Rivaroxaban 15 MG Tabs tablet   Commonly known as: XARELTO   Take 1 tablet (15 mg total) by mouth daily before breakfast.      rOPINIRole 2 MG 24 hr tablet   Commonly known as: REQUIP XL   Take 2 mg by mouth at bedtime.             Disposition and Follow-up:  Patient will be discharged home today in stable and improved condition. She is instructed to followup with her primary care physician in 2-4 weeks.  Consults:  None    Significant Diagnostic Studies:  No results found.  Brief H and P: For complete details please refer to admission H and P, but in brief patient is a 78 y.o. female was sent to the vascular lab today after being seen by her primary care physician with complaints of left leg swelling and pain. The patient was found to have an acute DVT involving the left leg. The patient's primary care physician called the [facility] to request a direct admission.      Hospital Course:  Principal Problem:  *DVT of leg (deep venous thrombosis) Active  Problems:  HYPERLIPIDEMIA  RESTLESS LEG SYNDROME  HYPERTENSION  GERD  INSOMNIA  SLEEP APNEA  FATIGUE  CROHN'S DISEASE, HX OF  Overactive bladder   #1 left lower extremity DVT: Patient has not exhibited any shortness of breath. Pain of her left leg has improved. She was initially started on Lovenox and has been transitioned over to xarelto of which she should take 15 mg twice daily for 21 days and then transition over to 30 mg daily. Given that this was an episode of unprovoked DVT she should remain on anticoagulation for at least 6 months. She is very active and I do not believe that her age is a contraindication to anticoagulation at this point.  Rest of her chronic medical conditions have been stable and her home medications have not been altered.  Time spent on Discharge: Less than 30 minutes.  SignedLelon Frohlich [facility] Pager: [phone] 01/09/2012, 3:04 PM

## 2012-01-15 DIAGNOSIS — G609 Hereditary and idiopathic neuropathy, unspecified: Secondary | ICD-10-CM | POA: Diagnosis not present

## 2012-01-15 DIAGNOSIS — I10 Essential (primary) hypertension: Secondary | ICD-10-CM | POA: Diagnosis not present

## 2012-01-15 DIAGNOSIS — F3289 Other specified depressive episodes: Secondary | ICD-10-CM | POA: Diagnosis not present

## 2012-01-15 DIAGNOSIS — F329 Major depressive disorder, single episode, unspecified: Secondary | ICD-10-CM | POA: Diagnosis not present

## 2012-01-15 DIAGNOSIS — G2589 Other specified extrapyramidal and movement disorders: Secondary | ICD-10-CM | POA: Diagnosis not present

## 2012-01-15 DIAGNOSIS — E119 Type 2 diabetes mellitus without complications: Secondary | ICD-10-CM | POA: Diagnosis not present

## 2012-01-15 DIAGNOSIS — I824Z9 Acute embolism and thrombosis of unspecified deep veins of unspecified distal lower extremity: Secondary | ICD-10-CM | POA: Diagnosis not present

## 2012-01-15 DIAGNOSIS — E538 Deficiency of other specified B group vitamins: Secondary | ICD-10-CM | POA: Diagnosis not present

## 2012-01-29 DIAGNOSIS — F3289 Other specified depressive episodes: Secondary | ICD-10-CM | POA: Diagnosis not present

## 2012-01-29 DIAGNOSIS — F329 Major depressive disorder, single episode, unspecified: Secondary | ICD-10-CM | POA: Diagnosis not present

## 2012-01-29 DIAGNOSIS — G2589 Other specified extrapyramidal and movement disorders: Secondary | ICD-10-CM | POA: Diagnosis not present

## 2012-01-29 DIAGNOSIS — I824Z9 Acute embolism and thrombosis of unspecified deep veins of unspecified distal lower extremity: Secondary | ICD-10-CM | POA: Diagnosis not present

## 2012-01-29 DIAGNOSIS — E538 Deficiency of other specified B group vitamins: Secondary | ICD-10-CM | POA: Diagnosis not present

## 2012-01-29 DIAGNOSIS — G609 Hereditary and idiopathic neuropathy, unspecified: Secondary | ICD-10-CM | POA: Diagnosis not present

## 2012-01-29 DIAGNOSIS — I10 Essential (primary) hypertension: Secondary | ICD-10-CM | POA: Diagnosis not present

## 2012-01-29 DIAGNOSIS — L299 Pruritus, unspecified: Secondary | ICD-10-CM | POA: Diagnosis not present

## 2012-01-29 DIAGNOSIS — E119 Type 2 diabetes mellitus without complications: Secondary | ICD-10-CM | POA: Diagnosis not present

## 2012-02-02 ENCOUNTER — Telehealth: Payer: Self-pay | Admitting: Hematology & Oncology

## 2012-02-02 NOTE — Telephone Encounter (Signed)
Pt aware of 3-28 appointment

## 2012-02-03 ENCOUNTER — Ambulatory Visit (HOSPITAL_BASED_OUTPATIENT_CLINIC_OR_DEPARTMENT_OTHER)
Admission: RE | Admit: 2012-02-03 | Discharge: 2012-02-03 | Disposition: A | Discharge status: Home / Self Care | Payer: Medicare Other | Source: Ambulatory Visit | Attending: Hematology & Oncology | Admitting: Hematology & Oncology

## 2012-02-03 ENCOUNTER — Ambulatory Visit (INDEPENDENT_AMBULATORY_CARE_PROVIDER_SITE_OTHER)
Admission: RE | Admit: 2012-02-03 | Discharge: 2012-02-03 | Disposition: A | Discharge status: Home / Self Care | Payer: Medicare Other | Source: Ambulatory Visit | Attending: Hematology & Oncology | Admitting: Hematology & Oncology

## 2012-02-03 ENCOUNTER — Ambulatory Visit: Payer: Medicare Other

## 2012-02-03 ENCOUNTER — Other Ambulatory Visit (HOSPITAL_BASED_OUTPATIENT_CLINIC_OR_DEPARTMENT_OTHER): Payer: Medicare Other | Admitting: Lab

## 2012-02-03 ENCOUNTER — Ambulatory Visit (HOSPITAL_BASED_OUTPATIENT_CLINIC_OR_DEPARTMENT_OTHER): Payer: Medicare Other | Admitting: Hematology & Oncology

## 2012-02-03 DIAGNOSIS — D649 Anemia, unspecified: Secondary | ICD-10-CM | POA: Diagnosis not present

## 2012-02-03 DIAGNOSIS — R059 Cough, unspecified: Secondary | ICD-10-CM | POA: Insufficient documentation

## 2012-02-03 DIAGNOSIS — C259 Malignant neoplasm of pancreas, unspecified: Secondary | ICD-10-CM

## 2012-02-03 DIAGNOSIS — K449 Diaphragmatic hernia without obstruction or gangrene: Secondary | ICD-10-CM | POA: Diagnosis not present

## 2012-02-03 DIAGNOSIS — R05 Cough: Secondary | ICD-10-CM

## 2012-02-03 DIAGNOSIS — R5381 Other malaise: Secondary | ICD-10-CM | POA: Diagnosis not present

## 2012-02-03 DIAGNOSIS — R634 Abnormal weight loss: Secondary | ICD-10-CM | POA: Diagnosis not present

## 2012-02-03 DIAGNOSIS — I82409 Acute embolism and thrombosis of unspecified deep veins of unspecified lower extremity: Secondary | ICD-10-CM

## 2012-02-03 DIAGNOSIS — E538 Deficiency of other specified B group vitamins: Secondary | ICD-10-CM

## 2012-02-03 DIAGNOSIS — K8689 Other specified diseases of pancreas: Secondary | ICD-10-CM

## 2012-02-03 DIAGNOSIS — Z87891 Personal history of nicotine dependence: Secondary | ICD-10-CM

## 2012-02-03 DIAGNOSIS — R630 Anorexia: Secondary | ICD-10-CM | POA: Diagnosis not present

## 2012-02-03 DIAGNOSIS — M549 Dorsalgia, unspecified: Secondary | ICD-10-CM | POA: Insufficient documentation

## 2012-02-03 DIAGNOSIS — K869 Disease of pancreas, unspecified: Secondary | ICD-10-CM | POA: Diagnosis not present

## 2012-02-03 DIAGNOSIS — F329 Major depressive disorder, single episode, unspecified: Secondary | ICD-10-CM | POA: Diagnosis not present

## 2012-02-03 DIAGNOSIS — F3289 Other specified depressive episodes: Secondary | ICD-10-CM | POA: Diagnosis not present

## 2012-02-03 LAB — CBC WITH DIFFERENTIAL (CANCER CENTER ONLY)
BASO#: 0 10*3/uL (ref 0.0–0.2)
Eosinophils Absolute: 0.1 10*3/uL (ref 0.0–0.5)
HCT: 38.7 % (ref 34.8–46.6)
HGB: 12.6 g/dL (ref 11.6–15.9)
LYMPH#: 2.5 10*3/uL (ref 0.9–3.3)
NEUT#: 3.8 10*3/uL (ref 1.5–6.5)
RBC: 4.21 10*6/uL (ref 3.70–5.32)

## 2012-02-03 NOTE — Progress Notes (Signed)
CC:   [name], M.D.  DIAGNOSES: 1. Fatigue. 2. History of vitamin B12 deficiency. 3. Recent deep venous thrombosis of the left leg.  HISTORY OF PRESENT ILLNESS:  [name] is a very nice but frail 78 year old white female.  She is followed by Dr. Vista Lawman.  She recently was hospitalized with a DVT of the left leg.  She was told that this happened because she was not that active.  She noted acute swelling of the left leg.  There was no real associated pain with this.  She does have restless leg syndrome.  She underwent a Doppler study.  This was done by the vascular lab.  This was done on the 30th of January.  This did show a DVT involving the left lower extremity.  This extended from the left femoral vein into the left posterior tibial vein.  She did not have any workup for this.  She did have a chest x-ray on the 3rd of February.  This just showed a hiatal hernia.  She does have a lot of back issues.  She did have a CT scan of the lumbar spine back in August.  This showed degenerative disk disease at L4-5.  She had a ligamentum flava hypertrophy at L3-L4.  This was thought to cause moderate to severe spinal stenosis.  She has been complaining of being fatigued.  She apparently has B12 deficiency.  On results faxed by Dr. Matthias Hughs office, her last B12 level was 1600.  She is getting B12 twice a month.  She has had minimal anemia.  Her last electrolytes done back in January showed albumin of 2.9.  Her calcium is 9.2.  Liver function tests were okay.  She was sent to the [facility] to see if we could help with her fatigue.  Her appetite is marginal.  It is hard to say if she has lost weight. She is on Xarelto now for the DVT.  She has not had any bleeding.  There is apparently some history of Crohn disease.  I am not sure if this is true or not.  She does have a history of diabetes.  PAST MEDICAL HISTORY:  Remarkable for: 1.  Non-insulin-dependent diabetes. 2. Hypertension. 3. "B12 deficiency." 4. Peripheral neuropathy. 5. Restless legs syndrome. 6. Depression.  ALLERGIES: 1. Sulfa. 2. Penicillin.  MEDICATIONS:  Cymbalta 30 mg daily, metformin 1000 mg p.o. b.i.d., Percocet (5/325) one p.o. q.4 hours p.r.n., Xarelto 15 mg p.o. daily, Requip XL 2 mg p.o. daily.  SOCIAL HISTORY:  Negative tobacco use.  There is there is no alcohol use.  FAMILY HISTORY:  Remarkable for melanoma in her daughter who died at the age of 55.  A son also had melanoma.  REVIEW OF SYSTEMS:  As stated in history of present illness.  She has had multiple surgical procedures.  She is complaining of the back discomfort.  There is some abdominal discomfort.  PHYSICAL EXAM:  General:  This is an elderly, somewhat feeble white female in no obvious distress.  Vital signs:  98.2, pulse 68, respiratory rate 18, blood pressure 131/82.  Weight was 130.  Head and neck:  Normocephalic, atraumatic skull.  She has no ocular or oral lesions.  There are no palpable cervical, supraclavicular lymph nodes. Lungs:  Clear bilaterally.  Cardiac:  Regular rate and rhythm with normal S1, S2.  She has no murmurs, rubs or bruits.  Abdomen:  Soft with good bowel sounds.  There is no palpable abdominal mass.  There is no fluid wave.  There is no palpable hepatosplenomegaly.  Extremities: Some nonpitting edema of the left leg.  I cannot palpate any venous cord.  She has good pulses in distal extremities.  There is some age- related osteoarthritic changes in her joints.  Skin:  No rashes, ecchymosis or petechia.  Neurologic:  Exam shows no focal neurological deficits.  LABORATORY STUDIES:  White count 6.9, hemoglobin 12.6, hematocrit 38.7, platelet count 221.  Her peripheral smear shows a normochromic, normocytic population of red blood cells.  There are no nucleated red blood cells.  There are no teardrop cells.  I see no rouleaux formation.  There are  no target cells.  White cells appear with no hypersegmented polys.  There is no immature myeloid or lymphoid forms.  Platelets are adequate in number and size.  IMPRESSION:  [name] is a 78 year old white female with fatigue.  She has a deep venous thrombosis in the left leg.  I am not sure what is going on with her.  I certainly do not see any obvious hematologic issue going on.  I would not do a hypercoagulable workup on her at age 7 years old.  One could postulate a malignancy with [name].  The symptom complex of depression, DVT, and some weight loss could certainly point to pancreatic cancer.  As such, I am going to get an ultrasound of her abdomen to see if there is any obvious pancreatic abnormalities.  She clearly is not B12 deficient now.  She could probably go to once a month for her B12 injections.  I do not see any need for invasive studies on her (i.e., bone marrow tests).  I will check her iron studies.  We will see if she is iron deficient despite not being anemic.  This might help explain her being fatigued.  [name] is awful nice.  I was not too sure how much we could really help her out.  I spent an hour with her.  Her son her came with her.  I am not sure if we have to get her back to the office.  We will await the results of all of her lab work and ultrasound and then make decisions at that point in time.    ______________________________ Volanda Napoleon, M.D. PRE/MEDQ  D:  02/03/2012  T:  02/03/2012  Job:  [id number]

## 2012-02-03 NOTE — Progress Notes (Signed)
This office note has been dictated.

## 2012-02-04 ENCOUNTER — Encounter: Payer: Self-pay | Admitting: *Deleted

## 2012-02-04 ENCOUNTER — Telehealth: Payer: Self-pay | Admitting: *Deleted

## 2012-02-04 LAB — IRON AND TIBC
Iron: 74 ug/dL (ref 42–145)
TIBC: 256 ug/dL (ref 250–470)
UIBC: 182 ug/dL (ref 125–400)

## 2012-02-04 LAB — FERRITIN: Ferritin: 102 ng/mL (ref 10–291)

## 2012-02-04 LAB — ERYTHROPOIETIN: Erythropoietin: 154 m[IU]/mL — ABNORMAL HIGH (ref 2.6–34.0)

## 2012-02-04 NOTE — Telephone Encounter (Signed)
Message copied by Rico Ala on Wed Feb 04, 2012  2:39 PM ------      Message from: Volanda Napoleon      Created: Tue Feb 03, 2012  8:48 PM       Call her son:  cxr and u/s are both normal!!  Will see how blood work looks and will call.  pete

## 2012-02-04 NOTE — Telephone Encounter (Signed)
Called Christina patients granddaughter to let her know that her grandmothers CXR and [place] are both normal.  Dr. Niel Hummer will see how blood work looks and will give her a call.

## 2012-02-04 NOTE — Progress Notes (Signed)
Referral made to Dr. Kinnie Scales.  Pt expressed interest in talking with Dr Ferdinand Lango when asked if she is having feelings of hopelessness during assessment.  Son confirms that pt is having trouble with feeling so tired and unable to go out and do the things that she loves to do.  She  Has been to 5 different MD's during the past year and no one has found the reason for this fatigue.  States the last time she went shopping (did admit to being a shop a holic) she passed out.  This was the first time it happened and has only occurred one other time.  Her son said they had had a long talk the other night about her finding "a new normal" for her.  She is just not ready to give up her active lifestyle but her body is requiring her to do so.

## 2012-02-16 DIAGNOSIS — M545 Low back pain, unspecified: Secondary | ICD-10-CM | POA: Diagnosis not present

## 2012-02-17 DIAGNOSIS — M6281 Muscle weakness (generalized): Secondary | ICD-10-CM | POA: Diagnosis not present

## 2012-02-17 DIAGNOSIS — I10 Essential (primary) hypertension: Secondary | ICD-10-CM | POA: Diagnosis not present

## 2012-02-17 DIAGNOSIS — R269 Unspecified abnormalities of gait and mobility: Secondary | ICD-10-CM | POA: Diagnosis not present

## 2012-02-17 DIAGNOSIS — G2589 Other specified extrapyramidal and movement disorders: Secondary | ICD-10-CM | POA: Diagnosis not present

## 2012-02-17 DIAGNOSIS — E119 Type 2 diabetes mellitus without complications: Secondary | ICD-10-CM | POA: Diagnosis not present

## 2012-02-17 DIAGNOSIS — I824Z9 Acute embolism and thrombosis of unspecified deep veins of unspecified distal lower extremity: Secondary | ICD-10-CM | POA: Diagnosis not present

## 2012-02-17 DIAGNOSIS — E538 Deficiency of other specified B group vitamins: Secondary | ICD-10-CM | POA: Diagnosis not present

## 2012-02-17 DIAGNOSIS — G609 Hereditary and idiopathic neuropathy, unspecified: Secondary | ICD-10-CM | POA: Diagnosis not present

## 2012-02-18 ENCOUNTER — Ambulatory Visit: Payer: Medicare Other | Attending: Internal Medicine | Admitting: Physical Therapy

## 2012-02-18 DIAGNOSIS — M6281 Muscle weakness (generalized): Secondary | ICD-10-CM | POA: Diagnosis not present

## 2012-02-18 DIAGNOSIS — IMO0001 Reserved for inherently not codable concepts without codable children: Secondary | ICD-10-CM | POA: Diagnosis not present

## 2012-02-18 DIAGNOSIS — R262 Difficulty in walking, not elsewhere classified: Secondary | ICD-10-CM | POA: Insufficient documentation

## 2012-02-18 DIAGNOSIS — R5381 Other malaise: Secondary | ICD-10-CM | POA: Diagnosis not present

## 2012-03-01 ENCOUNTER — Ambulatory Visit: Payer: Medicare Other | Admitting: Physical Therapy

## 2012-03-03 ENCOUNTER — Ambulatory Visit: Payer: Medicare Other | Admitting: Physical Therapy

## 2012-03-03 DIAGNOSIS — R269 Unspecified abnormalities of gait and mobility: Secondary | ICD-10-CM | POA: Diagnosis not present

## 2012-03-03 DIAGNOSIS — G609 Hereditary and idiopathic neuropathy, unspecified: Secondary | ICD-10-CM | POA: Diagnosis not present

## 2012-03-03 DIAGNOSIS — I824Z9 Acute embolism and thrombosis of unspecified deep veins of unspecified distal lower extremity: Secondary | ICD-10-CM | POA: Diagnosis not present

## 2012-03-03 DIAGNOSIS — E119 Type 2 diabetes mellitus without complications: Secondary | ICD-10-CM | POA: Diagnosis not present

## 2012-03-03 DIAGNOSIS — I10 Essential (primary) hypertension: Secondary | ICD-10-CM | POA: Diagnosis not present

## 2012-03-03 DIAGNOSIS — R627 Adult failure to thrive: Secondary | ICD-10-CM | POA: Diagnosis not present

## 2012-03-03 DIAGNOSIS — E538 Deficiency of other specified B group vitamins: Secondary | ICD-10-CM | POA: Diagnosis not present

## 2012-03-03 DIAGNOSIS — M6281 Muscle weakness (generalized): Secondary | ICD-10-CM | POA: Diagnosis not present

## 2012-03-04 ENCOUNTER — Other Ambulatory Visit: Payer: BLUE CROSS/BLUE SHIELD | Admitting: Lab

## 2012-03-04 ENCOUNTER — Ambulatory Visit: Payer: BLUE CROSS/BLUE SHIELD

## 2012-03-04 ENCOUNTER — Ambulatory Visit: Payer: BLUE CROSS/BLUE SHIELD | Admitting: Hematology & Oncology

## 2012-03-08 ENCOUNTER — Ambulatory Visit: Payer: Medicare Other | Attending: Internal Medicine | Admitting: Physical Therapy

## 2012-03-08 ENCOUNTER — Other Ambulatory Visit: Payer: Self-pay

## 2012-03-08 DIAGNOSIS — R262 Difficulty in walking, not elsewhere classified: Secondary | ICD-10-CM | POA: Diagnosis not present

## 2012-03-08 DIAGNOSIS — IMO0001 Reserved for inherently not codable concepts without codable children: Secondary | ICD-10-CM | POA: Insufficient documentation

## 2012-03-08 DIAGNOSIS — R5381 Other malaise: Secondary | ICD-10-CM | POA: Insufficient documentation

## 2012-03-08 DIAGNOSIS — H903 Sensorineural hearing loss, bilateral: Secondary | ICD-10-CM | POA: Diagnosis not present

## 2012-03-08 DIAGNOSIS — M6281 Muscle weakness (generalized): Secondary | ICD-10-CM | POA: Diagnosis not present

## 2012-03-10 ENCOUNTER — Ambulatory Visit: Payer: Medicare Other | Admitting: Physical Therapy

## 2012-03-12 ENCOUNTER — Encounter: Payer: Medicare Other | Admitting: Physical Therapy

## 2012-03-15 ENCOUNTER — Ambulatory Visit: Payer: Medicare Other | Admitting: Physical Therapy

## 2012-03-17 ENCOUNTER — Encounter: Payer: Medicare Other | Admitting: Physical Therapy

## 2012-03-18 ENCOUNTER — Ambulatory Visit: Payer: Medicare Other

## 2012-03-19 ENCOUNTER — Encounter: Payer: Medicare Other | Admitting: Physical Therapy

## 2012-03-22 ENCOUNTER — Encounter: Payer: Medicare Other | Admitting: Physical Therapy

## 2012-03-23 DIAGNOSIS — M159 Polyosteoarthritis, unspecified: Secondary | ICD-10-CM | POA: Diagnosis not present

## 2012-03-23 DIAGNOSIS — E1142 Type 2 diabetes mellitus with diabetic polyneuropathy: Secondary | ICD-10-CM | POA: Diagnosis not present

## 2012-03-23 DIAGNOSIS — E1149 Type 2 diabetes mellitus with other diabetic neurological complication: Secondary | ICD-10-CM | POA: Diagnosis not present

## 2012-03-23 DIAGNOSIS — I10 Essential (primary) hypertension: Secondary | ICD-10-CM | POA: Diagnosis not present

## 2012-03-23 DIAGNOSIS — R42 Dizziness and giddiness: Secondary | ICD-10-CM | POA: Diagnosis not present

## 2012-03-23 DIAGNOSIS — R262 Difficulty in walking, not elsewhere classified: Secondary | ICD-10-CM | POA: Diagnosis not present

## 2012-03-24 ENCOUNTER — Encounter: Payer: Medicare Other | Admitting: Physical Therapy

## 2012-03-25 ENCOUNTER — Encounter: Payer: Medicare Other | Admitting: Physical Therapy

## 2012-03-25 DIAGNOSIS — Z01810 Encounter for preprocedural cardiovascular examination: Secondary | ICD-10-CM | POA: Diagnosis not present

## 2012-03-25 DIAGNOSIS — G609 Hereditary and idiopathic neuropathy, unspecified: Secondary | ICD-10-CM | POA: Diagnosis not present

## 2012-03-25 DIAGNOSIS — F329 Major depressive disorder, single episode, unspecified: Secondary | ICD-10-CM | POA: Diagnosis not present

## 2012-03-25 DIAGNOSIS — G2589 Other specified extrapyramidal and movement disorders: Secondary | ICD-10-CM | POA: Diagnosis not present

## 2012-03-25 DIAGNOSIS — M6281 Muscle weakness (generalized): Secondary | ICD-10-CM | POA: Diagnosis not present

## 2012-03-25 DIAGNOSIS — M159 Polyosteoarthritis, unspecified: Secondary | ICD-10-CM | POA: Diagnosis not present

## 2012-03-25 DIAGNOSIS — I824Z9 Acute embolism and thrombosis of unspecified deep veins of unspecified distal lower extremity: Secondary | ICD-10-CM | POA: Diagnosis not present

## 2012-03-25 DIAGNOSIS — R262 Difficulty in walking, not elsewhere classified: Secondary | ICD-10-CM | POA: Diagnosis not present

## 2012-03-25 DIAGNOSIS — E538 Deficiency of other specified B group vitamins: Secondary | ICD-10-CM | POA: Diagnosis not present

## 2012-03-25 DIAGNOSIS — I10 Essential (primary) hypertension: Secondary | ICD-10-CM | POA: Diagnosis not present

## 2012-03-25 DIAGNOSIS — E1142 Type 2 diabetes mellitus with diabetic polyneuropathy: Secondary | ICD-10-CM | POA: Diagnosis not present

## 2012-03-25 DIAGNOSIS — F3289 Other specified depressive episodes: Secondary | ICD-10-CM | POA: Diagnosis not present

## 2012-03-25 DIAGNOSIS — R5381 Other malaise: Secondary | ICD-10-CM | POA: Diagnosis not present

## 2012-03-25 DIAGNOSIS — R627 Adult failure to thrive: Secondary | ICD-10-CM | POA: Diagnosis not present

## 2012-03-25 DIAGNOSIS — R42 Dizziness and giddiness: Secondary | ICD-10-CM | POA: Diagnosis not present

## 2012-03-25 DIAGNOSIS — Z01811 Encounter for preprocedural respiratory examination: Secondary | ICD-10-CM | POA: Diagnosis not present

## 2012-03-25 DIAGNOSIS — R269 Unspecified abnormalities of gait and mobility: Secondary | ICD-10-CM | POA: Diagnosis not present

## 2012-03-25 DIAGNOSIS — E559 Vitamin D deficiency, unspecified: Secondary | ICD-10-CM | POA: Diagnosis not present

## 2012-03-25 DIAGNOSIS — E1149 Type 2 diabetes mellitus with other diabetic neurological complication: Secondary | ICD-10-CM | POA: Diagnosis not present

## 2012-03-25 DIAGNOSIS — R55 Syncope and collapse: Secondary | ICD-10-CM | POA: Diagnosis not present

## 2012-03-25 DIAGNOSIS — E1159 Type 2 diabetes mellitus with other circulatory complications: Secondary | ICD-10-CM | POA: Diagnosis not present

## 2012-03-25 DIAGNOSIS — E119 Type 2 diabetes mellitus without complications: Secondary | ICD-10-CM | POA: Diagnosis not present

## 2012-03-26 ENCOUNTER — Encounter: Payer: Medicare Other | Admitting: Physical Therapy

## 2012-03-26 DIAGNOSIS — R42 Dizziness and giddiness: Secondary | ICD-10-CM | POA: Diagnosis not present

## 2012-03-26 DIAGNOSIS — E1149 Type 2 diabetes mellitus with other diabetic neurological complication: Secondary | ICD-10-CM | POA: Diagnosis not present

## 2012-03-26 DIAGNOSIS — M159 Polyosteoarthritis, unspecified: Secondary | ICD-10-CM | POA: Diagnosis not present

## 2012-03-26 DIAGNOSIS — E1142 Type 2 diabetes mellitus with diabetic polyneuropathy: Secondary | ICD-10-CM | POA: Diagnosis not present

## 2012-03-26 DIAGNOSIS — I10 Essential (primary) hypertension: Secondary | ICD-10-CM | POA: Diagnosis not present

## 2012-03-26 DIAGNOSIS — R262 Difficulty in walking, not elsewhere classified: Secondary | ICD-10-CM | POA: Diagnosis not present

## 2012-03-29 ENCOUNTER — Encounter: Payer: Medicare Other | Admitting: Physical Therapy

## 2012-03-29 DIAGNOSIS — R262 Difficulty in walking, not elsewhere classified: Secondary | ICD-10-CM | POA: Diagnosis not present

## 2012-03-29 DIAGNOSIS — R42 Dizziness and giddiness: Secondary | ICD-10-CM | POA: Diagnosis not present

## 2012-03-29 DIAGNOSIS — I10 Essential (primary) hypertension: Secondary | ICD-10-CM | POA: Diagnosis not present

## 2012-03-29 DIAGNOSIS — E1149 Type 2 diabetes mellitus with other diabetic neurological complication: Secondary | ICD-10-CM | POA: Diagnosis not present

## 2012-03-29 DIAGNOSIS — E1142 Type 2 diabetes mellitus with diabetic polyneuropathy: Secondary | ICD-10-CM | POA: Diagnosis not present

## 2012-03-29 DIAGNOSIS — M159 Polyosteoarthritis, unspecified: Secondary | ICD-10-CM | POA: Diagnosis not present

## 2012-03-30 DIAGNOSIS — I10 Essential (primary) hypertension: Secondary | ICD-10-CM | POA: Diagnosis not present

## 2012-03-30 DIAGNOSIS — R42 Dizziness and giddiness: Secondary | ICD-10-CM | POA: Diagnosis not present

## 2012-03-30 DIAGNOSIS — E1142 Type 2 diabetes mellitus with diabetic polyneuropathy: Secondary | ICD-10-CM | POA: Diagnosis not present

## 2012-03-30 DIAGNOSIS — M159 Polyosteoarthritis, unspecified: Secondary | ICD-10-CM | POA: Diagnosis not present

## 2012-03-30 DIAGNOSIS — E1149 Type 2 diabetes mellitus with other diabetic neurological complication: Secondary | ICD-10-CM | POA: Diagnosis not present

## 2012-03-30 DIAGNOSIS — R262 Difficulty in walking, not elsewhere classified: Secondary | ICD-10-CM | POA: Diagnosis not present

## 2012-03-31 DIAGNOSIS — M159 Polyosteoarthritis, unspecified: Secondary | ICD-10-CM | POA: Diagnosis not present

## 2012-03-31 DIAGNOSIS — R42 Dizziness and giddiness: Secondary | ICD-10-CM | POA: Diagnosis not present

## 2012-03-31 DIAGNOSIS — I10 Essential (primary) hypertension: Secondary | ICD-10-CM | POA: Diagnosis not present

## 2012-03-31 DIAGNOSIS — E1142 Type 2 diabetes mellitus with diabetic polyneuropathy: Secondary | ICD-10-CM | POA: Diagnosis not present

## 2012-03-31 DIAGNOSIS — R262 Difficulty in walking, not elsewhere classified: Secondary | ICD-10-CM | POA: Diagnosis not present

## 2012-03-31 DIAGNOSIS — E1149 Type 2 diabetes mellitus with other diabetic neurological complication: Secondary | ICD-10-CM | POA: Diagnosis not present

## 2012-04-01 ENCOUNTER — Encounter: Payer: Medicare Other | Admitting: Physical Therapy

## 2012-04-01 DIAGNOSIS — I10 Essential (primary) hypertension: Secondary | ICD-10-CM | POA: Diagnosis not present

## 2012-04-01 DIAGNOSIS — E1149 Type 2 diabetes mellitus with other diabetic neurological complication: Secondary | ICD-10-CM | POA: Diagnosis not present

## 2012-04-01 DIAGNOSIS — R262 Difficulty in walking, not elsewhere classified: Secondary | ICD-10-CM | POA: Diagnosis not present

## 2012-04-01 DIAGNOSIS — M159 Polyosteoarthritis, unspecified: Secondary | ICD-10-CM | POA: Diagnosis not present

## 2012-04-01 DIAGNOSIS — R42 Dizziness and giddiness: Secondary | ICD-10-CM | POA: Diagnosis not present

## 2012-04-01 DIAGNOSIS — E1142 Type 2 diabetes mellitus with diabetic polyneuropathy: Secondary | ICD-10-CM | POA: Diagnosis not present

## 2012-04-05 ENCOUNTER — Encounter: Payer: Medicare Other | Admitting: Physical Therapy

## 2012-04-05 DIAGNOSIS — I10 Essential (primary) hypertension: Secondary | ICD-10-CM | POA: Diagnosis not present

## 2012-04-05 DIAGNOSIS — R262 Difficulty in walking, not elsewhere classified: Secondary | ICD-10-CM | POA: Diagnosis not present

## 2012-04-05 DIAGNOSIS — E1149 Type 2 diabetes mellitus with other diabetic neurological complication: Secondary | ICD-10-CM | POA: Diagnosis not present

## 2012-04-05 DIAGNOSIS — M159 Polyosteoarthritis, unspecified: Secondary | ICD-10-CM | POA: Diagnosis not present

## 2012-04-05 DIAGNOSIS — E1142 Type 2 diabetes mellitus with diabetic polyneuropathy: Secondary | ICD-10-CM | POA: Diagnosis not present

## 2012-04-05 DIAGNOSIS — R42 Dizziness and giddiness: Secondary | ICD-10-CM | POA: Diagnosis not present

## 2012-04-06 DIAGNOSIS — E1142 Type 2 diabetes mellitus with diabetic polyneuropathy: Secondary | ICD-10-CM | POA: Diagnosis not present

## 2012-04-06 DIAGNOSIS — R262 Difficulty in walking, not elsewhere classified: Secondary | ICD-10-CM | POA: Diagnosis not present

## 2012-04-06 DIAGNOSIS — R42 Dizziness and giddiness: Secondary | ICD-10-CM | POA: Diagnosis not present

## 2012-04-06 DIAGNOSIS — E1149 Type 2 diabetes mellitus with other diabetic neurological complication: Secondary | ICD-10-CM | POA: Diagnosis not present

## 2012-04-06 DIAGNOSIS — M159 Polyosteoarthritis, unspecified: Secondary | ICD-10-CM | POA: Diagnosis not present

## 2012-04-06 DIAGNOSIS — I10 Essential (primary) hypertension: Secondary | ICD-10-CM | POA: Diagnosis not present

## 2012-04-07 DIAGNOSIS — E1149 Type 2 diabetes mellitus with other diabetic neurological complication: Secondary | ICD-10-CM | POA: Diagnosis not present

## 2012-04-07 DIAGNOSIS — E1142 Type 2 diabetes mellitus with diabetic polyneuropathy: Secondary | ICD-10-CM | POA: Diagnosis not present

## 2012-04-07 DIAGNOSIS — R262 Difficulty in walking, not elsewhere classified: Secondary | ICD-10-CM | POA: Diagnosis not present

## 2012-04-07 DIAGNOSIS — I10 Essential (primary) hypertension: Secondary | ICD-10-CM | POA: Diagnosis not present

## 2012-04-07 DIAGNOSIS — M159 Polyosteoarthritis, unspecified: Secondary | ICD-10-CM | POA: Diagnosis not present

## 2012-04-07 DIAGNOSIS — R42 Dizziness and giddiness: Secondary | ICD-10-CM | POA: Diagnosis not present

## 2012-04-08 ENCOUNTER — Encounter: Payer: Medicare Other | Admitting: Physical Therapy

## 2012-04-08 DIAGNOSIS — M159 Polyosteoarthritis, unspecified: Secondary | ICD-10-CM | POA: Diagnosis not present

## 2012-04-08 DIAGNOSIS — R42 Dizziness and giddiness: Secondary | ICD-10-CM | POA: Diagnosis not present

## 2012-04-08 DIAGNOSIS — R262 Difficulty in walking, not elsewhere classified: Secondary | ICD-10-CM | POA: Diagnosis not present

## 2012-04-08 DIAGNOSIS — E1149 Type 2 diabetes mellitus with other diabetic neurological complication: Secondary | ICD-10-CM | POA: Diagnosis not present

## 2012-04-08 DIAGNOSIS — I10 Essential (primary) hypertension: Secondary | ICD-10-CM | POA: Diagnosis not present

## 2012-04-08 DIAGNOSIS — E1142 Type 2 diabetes mellitus with diabetic polyneuropathy: Secondary | ICD-10-CM | POA: Diagnosis not present

## 2012-04-09 DIAGNOSIS — M159 Polyosteoarthritis, unspecified: Secondary | ICD-10-CM | POA: Diagnosis not present

## 2012-04-09 DIAGNOSIS — I10 Essential (primary) hypertension: Secondary | ICD-10-CM | POA: Diagnosis not present

## 2012-04-09 DIAGNOSIS — E1142 Type 2 diabetes mellitus with diabetic polyneuropathy: Secondary | ICD-10-CM | POA: Diagnosis not present

## 2012-04-09 DIAGNOSIS — R42 Dizziness and giddiness: Secondary | ICD-10-CM | POA: Diagnosis not present

## 2012-04-09 DIAGNOSIS — E1149 Type 2 diabetes mellitus with other diabetic neurological complication: Secondary | ICD-10-CM | POA: Diagnosis not present

## 2012-04-09 DIAGNOSIS — R262 Difficulty in walking, not elsewhere classified: Secondary | ICD-10-CM | POA: Diagnosis not present

## 2012-04-12 DIAGNOSIS — I10 Essential (primary) hypertension: Secondary | ICD-10-CM | POA: Diagnosis not present

## 2012-04-12 DIAGNOSIS — E1149 Type 2 diabetes mellitus with other diabetic neurological complication: Secondary | ICD-10-CM | POA: Diagnosis not present

## 2012-04-12 DIAGNOSIS — R262 Difficulty in walking, not elsewhere classified: Secondary | ICD-10-CM | POA: Diagnosis not present

## 2012-04-12 DIAGNOSIS — M159 Polyosteoarthritis, unspecified: Secondary | ICD-10-CM | POA: Diagnosis not present

## 2012-04-12 DIAGNOSIS — R42 Dizziness and giddiness: Secondary | ICD-10-CM | POA: Diagnosis not present

## 2012-04-12 DIAGNOSIS — E1142 Type 2 diabetes mellitus with diabetic polyneuropathy: Secondary | ICD-10-CM | POA: Diagnosis not present

## 2012-04-13 DIAGNOSIS — E1142 Type 2 diabetes mellitus with diabetic polyneuropathy: Secondary | ICD-10-CM | POA: Diagnosis not present

## 2012-04-13 DIAGNOSIS — E1149 Type 2 diabetes mellitus with other diabetic neurological complication: Secondary | ICD-10-CM | POA: Diagnosis not present

## 2012-04-13 DIAGNOSIS — I10 Essential (primary) hypertension: Secondary | ICD-10-CM | POA: Diagnosis not present

## 2012-04-13 DIAGNOSIS — M159 Polyosteoarthritis, unspecified: Secondary | ICD-10-CM | POA: Diagnosis not present

## 2012-04-13 DIAGNOSIS — R262 Difficulty in walking, not elsewhere classified: Secondary | ICD-10-CM | POA: Diagnosis not present

## 2012-04-13 DIAGNOSIS — R42 Dizziness and giddiness: Secondary | ICD-10-CM | POA: Diagnosis not present

## 2012-04-14 DIAGNOSIS — M2550 Pain in unspecified joint: Secondary | ICD-10-CM | POA: Diagnosis not present

## 2012-04-15 DIAGNOSIS — E1159 Type 2 diabetes mellitus with other circulatory complications: Secondary | ICD-10-CM | POA: Diagnosis not present

## 2012-04-15 DIAGNOSIS — I10 Essential (primary) hypertension: Secondary | ICD-10-CM | POA: Diagnosis not present

## 2012-04-16 DIAGNOSIS — M159 Polyosteoarthritis, unspecified: Secondary | ICD-10-CM | POA: Diagnosis not present

## 2012-04-16 DIAGNOSIS — E1142 Type 2 diabetes mellitus with diabetic polyneuropathy: Secondary | ICD-10-CM | POA: Diagnosis not present

## 2012-04-16 DIAGNOSIS — I10 Essential (primary) hypertension: Secondary | ICD-10-CM | POA: Diagnosis not present

## 2012-04-16 DIAGNOSIS — E1149 Type 2 diabetes mellitus with other diabetic neurological complication: Secondary | ICD-10-CM | POA: Diagnosis not present

## 2012-04-16 DIAGNOSIS — R42 Dizziness and giddiness: Secondary | ICD-10-CM | POA: Diagnosis not present

## 2012-04-16 DIAGNOSIS — R262 Difficulty in walking, not elsewhere classified: Secondary | ICD-10-CM | POA: Diagnosis not present

## 2012-04-19 DIAGNOSIS — R262 Difficulty in walking, not elsewhere classified: Secondary | ICD-10-CM | POA: Diagnosis not present

## 2012-04-19 DIAGNOSIS — E1142 Type 2 diabetes mellitus with diabetic polyneuropathy: Secondary | ICD-10-CM | POA: Diagnosis not present

## 2012-04-19 DIAGNOSIS — R42 Dizziness and giddiness: Secondary | ICD-10-CM | POA: Diagnosis not present

## 2012-04-19 DIAGNOSIS — I10 Essential (primary) hypertension: Secondary | ICD-10-CM | POA: Diagnosis not present

## 2012-04-19 DIAGNOSIS — M159 Polyosteoarthritis, unspecified: Secondary | ICD-10-CM | POA: Diagnosis not present

## 2012-04-19 DIAGNOSIS — E1149 Type 2 diabetes mellitus with other diabetic neurological complication: Secondary | ICD-10-CM | POA: Diagnosis not present

## 2012-04-20 DIAGNOSIS — I10 Essential (primary) hypertension: Secondary | ICD-10-CM | POA: Diagnosis not present

## 2012-04-20 DIAGNOSIS — M159 Polyosteoarthritis, unspecified: Secondary | ICD-10-CM | POA: Diagnosis not present

## 2012-04-20 DIAGNOSIS — E1149 Type 2 diabetes mellitus with other diabetic neurological complication: Secondary | ICD-10-CM | POA: Diagnosis not present

## 2012-04-20 DIAGNOSIS — E1142 Type 2 diabetes mellitus with diabetic polyneuropathy: Secondary | ICD-10-CM | POA: Diagnosis not present

## 2012-04-20 DIAGNOSIS — R262 Difficulty in walking, not elsewhere classified: Secondary | ICD-10-CM | POA: Diagnosis not present

## 2012-04-20 DIAGNOSIS — R42 Dizziness and giddiness: Secondary | ICD-10-CM | POA: Diagnosis not present

## 2012-04-21 DIAGNOSIS — E1142 Type 2 diabetes mellitus with diabetic polyneuropathy: Secondary | ICD-10-CM | POA: Diagnosis not present

## 2012-04-21 DIAGNOSIS — I10 Essential (primary) hypertension: Secondary | ICD-10-CM | POA: Diagnosis not present

## 2012-04-21 DIAGNOSIS — R262 Difficulty in walking, not elsewhere classified: Secondary | ICD-10-CM | POA: Diagnosis not present

## 2012-04-21 DIAGNOSIS — M159 Polyosteoarthritis, unspecified: Secondary | ICD-10-CM | POA: Diagnosis not present

## 2012-04-21 DIAGNOSIS — R42 Dizziness and giddiness: Secondary | ICD-10-CM | POA: Diagnosis not present

## 2012-04-21 DIAGNOSIS — E1149 Type 2 diabetes mellitus with other diabetic neurological complication: Secondary | ICD-10-CM | POA: Diagnosis not present

## 2012-04-28 DIAGNOSIS — E1149 Type 2 diabetes mellitus with other diabetic neurological complication: Secondary | ICD-10-CM | POA: Diagnosis not present

## 2012-04-28 DIAGNOSIS — R42 Dizziness and giddiness: Secondary | ICD-10-CM | POA: Diagnosis not present

## 2012-04-28 DIAGNOSIS — I10 Essential (primary) hypertension: Secondary | ICD-10-CM | POA: Diagnosis not present

## 2012-04-28 DIAGNOSIS — E1142 Type 2 diabetes mellitus with diabetic polyneuropathy: Secondary | ICD-10-CM | POA: Diagnosis not present

## 2012-04-28 DIAGNOSIS — R262 Difficulty in walking, not elsewhere classified: Secondary | ICD-10-CM | POA: Diagnosis not present

## 2012-04-28 DIAGNOSIS — M159 Polyosteoarthritis, unspecified: Secondary | ICD-10-CM | POA: Diagnosis not present

## 2012-05-05 DIAGNOSIS — I10 Essential (primary) hypertension: Secondary | ICD-10-CM | POA: Diagnosis not present

## 2012-05-05 DIAGNOSIS — E1142 Type 2 diabetes mellitus with diabetic polyneuropathy: Secondary | ICD-10-CM | POA: Diagnosis not present

## 2012-05-05 DIAGNOSIS — M159 Polyosteoarthritis, unspecified: Secondary | ICD-10-CM | POA: Diagnosis not present

## 2012-05-05 DIAGNOSIS — R42 Dizziness and giddiness: Secondary | ICD-10-CM | POA: Diagnosis not present

## 2012-05-05 DIAGNOSIS — R262 Difficulty in walking, not elsewhere classified: Secondary | ICD-10-CM | POA: Diagnosis not present

## 2012-05-05 DIAGNOSIS — E1149 Type 2 diabetes mellitus with other diabetic neurological complication: Secondary | ICD-10-CM | POA: Diagnosis not present

## 2012-05-13 DIAGNOSIS — M67919 Unspecified disorder of synovium and tendon, unspecified shoulder: Secondary | ICD-10-CM | POA: Diagnosis not present

## 2012-05-13 DIAGNOSIS — M545 Low back pain, unspecified: Secondary | ICD-10-CM | POA: Diagnosis not present

## 2012-05-27 DIAGNOSIS — K625 Hemorrhage of anus and rectum: Secondary | ICD-10-CM | POA: Diagnosis not present

## 2012-05-27 DIAGNOSIS — R627 Adult failure to thrive: Secondary | ICD-10-CM | POA: Diagnosis not present

## 2012-05-27 DIAGNOSIS — R55 Syncope and collapse: Secondary | ICD-10-CM | POA: Diagnosis not present

## 2012-05-27 DIAGNOSIS — I10 Essential (primary) hypertension: Secondary | ICD-10-CM | POA: Diagnosis not present

## 2012-05-27 DIAGNOSIS — E119 Type 2 diabetes mellitus without complications: Secondary | ICD-10-CM | POA: Diagnosis not present

## 2012-05-27 DIAGNOSIS — I824Z9 Acute embolism and thrombosis of unspecified deep veins of unspecified distal lower extremity: Secondary | ICD-10-CM | POA: Diagnosis not present

## 2012-05-27 DIAGNOSIS — R269 Unspecified abnormalities of gait and mobility: Secondary | ICD-10-CM | POA: Diagnosis not present

## 2012-05-27 DIAGNOSIS — M6281 Muscle weakness (generalized): Secondary | ICD-10-CM | POA: Diagnosis not present

## 2012-05-31 DIAGNOSIS — Z3200 Encounter for pregnancy test, result unknown: Secondary | ICD-10-CM | POA: Diagnosis not present

## 2012-05-31 DIAGNOSIS — Z0000 Encounter for general adult medical examination without abnormal findings: Secondary | ICD-10-CM | POA: Diagnosis not present

## 2012-05-31 DIAGNOSIS — J029 Acute pharyngitis, unspecified: Secondary | ICD-10-CM | POA: Diagnosis not present

## 2012-05-31 DIAGNOSIS — N390 Urinary tract infection, site not specified: Secondary | ICD-10-CM | POA: Diagnosis not present

## 2012-05-31 DIAGNOSIS — I10 Essential (primary) hypertension: Secondary | ICD-10-CM | POA: Diagnosis not present

## 2012-06-07 DIAGNOSIS — C20 Malignant neoplasm of rectum: Secondary | ICD-10-CM | POA: Diagnosis not present

## 2012-06-07 DIAGNOSIS — R195 Other fecal abnormalities: Secondary | ICD-10-CM | POA: Diagnosis not present

## 2012-06-07 DIAGNOSIS — K625 Hemorrhage of anus and rectum: Secondary | ICD-10-CM | POA: Diagnosis not present

## 2012-06-14 DIAGNOSIS — M719 Bursopathy, unspecified: Secondary | ICD-10-CM | POA: Diagnosis not present

## 2012-06-14 DIAGNOSIS — M67919 Unspecified disorder of synovium and tendon, unspecified shoulder: Secondary | ICD-10-CM | POA: Diagnosis not present

## 2012-06-17 DIAGNOSIS — R195 Other fecal abnormalities: Secondary | ICD-10-CM | POA: Diagnosis not present

## 2012-06-17 DIAGNOSIS — K625 Hemorrhage of anus and rectum: Secondary | ICD-10-CM | POA: Diagnosis not present

## 2012-06-17 DIAGNOSIS — K921 Melena: Secondary | ICD-10-CM | POA: Diagnosis not present

## 2012-06-17 DIAGNOSIS — K5730 Diverticulosis of large intestine without perforation or abscess without bleeding: Secondary | ICD-10-CM | POA: Diagnosis not present

## 2012-06-17 HISTORY — PX: COLON SURGERY: SHX602

## 2012-06-22 DIAGNOSIS — G2581 Restless legs syndrome: Secondary | ICD-10-CM | POA: Diagnosis not present

## 2012-06-22 DIAGNOSIS — F3289 Other specified depressive episodes: Secondary | ICD-10-CM | POA: Diagnosis not present

## 2012-06-22 DIAGNOSIS — M5030 Other cervical disc degeneration, unspecified cervical region: Secondary | ICD-10-CM | POA: Diagnosis not present

## 2012-06-22 DIAGNOSIS — D649 Anemia, unspecified: Secondary | ICD-10-CM | POA: Diagnosis not present

## 2012-06-22 DIAGNOSIS — F329 Major depressive disorder, single episode, unspecified: Secondary | ICD-10-CM | POA: Diagnosis not present

## 2012-06-23 DIAGNOSIS — R195 Other fecal abnormalities: Secondary | ICD-10-CM | POA: Diagnosis not present

## 2012-06-23 DIAGNOSIS — C20 Malignant neoplasm of rectum: Secondary | ICD-10-CM | POA: Diagnosis not present

## 2012-06-23 DIAGNOSIS — K625 Hemorrhage of anus and rectum: Secondary | ICD-10-CM | POA: Diagnosis not present

## 2012-06-23 DIAGNOSIS — E876 Hypokalemia: Secondary | ICD-10-CM | POA: Diagnosis not present

## 2012-06-26 ENCOUNTER — Emergency Department (HOSPITAL_COMMUNITY)
Admission: EM | Admit: 2012-06-26 | Discharge: 2012-06-26 | Disposition: A | Discharge status: Home / Self Care | Payer: Medicare Other | Attending: Emergency Medicine | Admitting: Emergency Medicine

## 2012-06-26 ENCOUNTER — Encounter (HOSPITAL_COMMUNITY): Payer: Self-pay | Admitting: Emergency Medicine

## 2012-06-26 ENCOUNTER — Emergency Department (HOSPITAL_COMMUNITY): Payer: Medicare Other

## 2012-06-26 DIAGNOSIS — Z79899 Other long term (current) drug therapy: Secondary | ICD-10-CM | POA: Insufficient documentation

## 2012-06-26 DIAGNOSIS — S161XXA Strain of muscle, fascia and tendon at neck level, initial encounter: Secondary | ICD-10-CM

## 2012-06-26 DIAGNOSIS — G319 Degenerative disease of nervous system, unspecified: Secondary | ICD-10-CM | POA: Diagnosis not present

## 2012-06-26 DIAGNOSIS — E785 Hyperlipidemia, unspecified: Secondary | ICD-10-CM | POA: Insufficient documentation

## 2012-06-26 DIAGNOSIS — S139XXA Sprain of joints and ligaments of unspecified parts of neck, initial encounter: Secondary | ICD-10-CM | POA: Diagnosis not present

## 2012-06-26 DIAGNOSIS — Z86718 Personal history of other venous thrombosis and embolism: Secondary | ICD-10-CM | POA: Diagnosis not present

## 2012-06-26 DIAGNOSIS — W1809XA Striking against other object with subsequent fall, initial encounter: Secondary | ICD-10-CM | POA: Insufficient documentation

## 2012-06-26 DIAGNOSIS — I10 Essential (primary) hypertension: Secondary | ICD-10-CM | POA: Diagnosis not present

## 2012-06-26 DIAGNOSIS — M542 Cervicalgia: Secondary | ICD-10-CM | POA: Insufficient documentation

## 2012-06-26 DIAGNOSIS — Y92009 Unspecified place in unspecified non-institutional (private) residence as the place of occurrence of the external cause: Secondary | ICD-10-CM | POA: Insufficient documentation

## 2012-06-26 DIAGNOSIS — Z9089 Acquired absence of other organs: Secondary | ICD-10-CM | POA: Diagnosis not present

## 2012-06-26 DIAGNOSIS — G4730 Sleep apnea, unspecified: Secondary | ICD-10-CM | POA: Insufficient documentation

## 2012-06-26 LAB — BASIC METABOLIC PANEL
BUN: 27 mg/dL — ABNORMAL HIGH (ref 6–23)
Calcium: 9.4 mg/dL (ref 8.4–10.5)
Creatinine, Ser: 0.88 mg/dL (ref 0.50–1.10)
GFR calc non Af Amer: 56 mL/min — ABNORMAL LOW (ref >90)
Glucose, Bld: 109 mg/dL — ABNORMAL HIGH (ref 70–99)
Sodium: 136 mEq/L (ref 135–145)

## 2012-06-26 LAB — CBC
MCH: 30.1 pg (ref 26.0–34.0)
MCHC: 32.6 g/dL (ref 30.0–36.0)
Platelets: 259 10*3/uL (ref 150–400)
RDW: 13 % (ref 11.5–15.5)

## 2012-06-26 LAB — PROTIME-INR: Prothrombin Time: 13 seconds (ref 11.6–15.2)

## 2012-06-26 MED ORDER — CYCLOBENZAPRINE HCL 10 MG PO TABS: 10.0000 mg | ORAL_TABLET | Freq: Two times a day (BID) | ORAL | Status: AC | PRN

## 2012-06-26 NOTE — ED Notes (Signed)
[id number] Expected date:<BR> Expected time:<BR> Means of arrival:<BR> Comments:<BR>

## 2012-06-26 NOTE — ED Provider Notes (Signed)
History     CSN: [id number]  Arrival date & time 06/26/12  0919   First MD Initiated Contact with Patient 06/26/12 1100      11:51 AM HPI Patient reports she is here for left-sided neck pain that began last night. Points to pain located just posterior to left ear. States she's concerned because approximately one month ago she fell and hit the back of her head on the tub. Reports she never came to emergency department for evaluation. States she's been asymptomatic since today. Denies fever, midline tenderness, shoulder pain, numbness, tingling, weakness, sore throat, or ear pain. The history is provided by the patient.    Past Medical History  Diagnosis Date  . Cyst and pseudocyst of pancreas   . Unspecified sleep apnea   . Unspecified essential hypertension   . Other and unspecified hyperlipidemia   . Polymyalgia rheumatica   . Personal history of unspecified digestive disease   . Restless legs syndrome (RLS)   . Insomnia, unspecified   . Other malaise and fatigue   . Diverticulosis of colon (without mention of hemorrhage)   . Acute gastritis without mention of hemorrhage   . Duodenitis without mention of hemorrhage   . Diaphragmatic hernia without mention of obstruction or gangrene   . Esophageal reflux   . Stricture and stenosis of esophagus   . OAB (overactive bladder)   . Left leg DVT     Past Surgical History  Procedure Date  . Oophorectomy   . Rotator cuff repair   . Back surgery   . Appendectomy   . Eye surgery     cataracts removed-bilateral eyes  . Colon surgery June 17, 2012    colostomy    Family History  Problem Relation Age of Onset  . Melanoma Daughter     died age 63  . Melanoma Son     History  Substance Use Topics  . Smoking status: Never Smoker   . Smokeless tobacco: Never Used  . Alcohol Use: No    OB History    Grav Para Term Preterm Abortions TAB SAB Ect Mult Living                  Review of Systems  Constitutional: Negative  for fever and chills.  HENT: Positive for neck pain. Negative for neck stiffness.   Gastrointestinal: Negative for nausea and vomiting.  Skin: Negative for rash and wound.  Neurological: Negative for dizziness, weakness, numbness and headaches.  All other systems reviewed and are negative.    Allergies  Sulfa antibiotics and Penicillins  Home Medications   Current Outpatient Rx  Name Route Sig Dispense Refill  . DULOXETINE HCL 30 MG PO CPEP Oral Take 30 mg by mouth daily.      Marland Kitchen LIDODERM 5 % EX PTCH Topical Apply 5 % topically Daily.    Marland Kitchen METFORMIN HCL 1000 MG PO TABS Oral Take 1,000 mg by mouth 2 (two) times daily with a meal.      . ADULT MULTIVITAMIN W/MINERALS CH Oral Take 1 tablet by mouth every Sunday. And Wednesday    . RIVAROXABAN 15 MG PO TABS Oral Take 1 tablet (15 mg total) by mouth daily before breakfast. 60 tablet 0  . ROPINIROLE HCL ER 2 MG PO TB24 Oral Take 2 mg by mouth at bedtime.      There were no vitals taken for this visit.  Physical Exam  Vitals reviewed. Constitutional: She is oriented to person, place, and  time. Vital signs are normal. She appears well-developed and well-nourished.  HENT:  Head: Normocephalic and atraumatic.  Eyes: Conjunctivae are normal. Pupils are equal, round, and reactive to light.  Neck: Normal range of motion. Neck supple.  Cardiovascular: Normal rate, regular rhythm and normal heart sounds.  Exam reveals no friction rub.   No murmur heard. Pulmonary/Chest: Effort normal and breath sounds normal. She has no wheezes. She has no rhonchi. She has no rales. She exhibits no tenderness.  Musculoskeletal: Normal range of motion.  Neurological: She is alert and oriented to person, place, and time. No cranial nerve deficit (tested CN III-XII). Coordination (no nystagmus, neg pronator drift) normal.  Skin: Skin is warm and dry. No rash noted. No erythema. No pallor.    ED Course  Procedures  Results for orders placed during the hospital  encounter of [id number]  BASIC METABOLIC PANEL      Component Value Range   Sodium 136  135 - 145 mEq/L   Potassium 4.5  3.5 - 5.1 mEq/L   Chloride 102  96 - 112 mEq/L   CO2 27  19 - 32 mEq/L   Glucose, Bld 109 (*) 70 - 99 mg/dL   BUN 27 (*) 6 - 23 mg/dL   Creatinine, Ser 0.88  0.50 - 1.10 mg/dL   Calcium 9.4  8.4 - 10.5 mg/dL   GFR calc non Af Amer 56 (*) >90 mL/min   GFR calc Af Amer 65 (*) >90 mL/min  CBC      Component Value Range   WBC 5.6  4.0 - 10.5 K/uL   RBC 3.36 (*) 3.87 - 5.11 MIL/uL   Hemoglobin 10.1 (*) 12.0 - 15.0 g/dL   HCT 31.0 (*) 36.0 - 46.0 %   MCV 92.3  78.0 - 100.0 fL   MCH 30.1  26.0 - 34.0 pg   MCHC 32.6  30.0 - 36.0 g/dL   RDW 13.0  11.5 - 15.5 %   Platelets 259  150 - 400 K/uL  PROTIME-INR      Component Value Range   Prothrombin Time 13.0  11.6 - 15.2 seconds   INR 0.96  0.00 - 1.49   Ct Head Wo Contrast  06/26/2012  *RADIOLOGY REPORT*  Clinical Data:  Fall 1 month ago, increased left neck pain, on blood thinners  CT HEAD WITHOUT CONTRAST CT CERVICAL SPINE WITHOUT CONTRAST  Technique:  Multidetector CT imaging of the head and cervical spine was performed following the standard protocol without intravenous contrast.  Multiplanar CT image reconstructions of the cervical spine were also generated.  Comparison:  CT head dated 01/11/2011  CT HEAD  Findings: Motion degraded images.  No evidence of parenchymal hemorrhage or extra-axial fluid collection. No mass lesion, mass effect, or midline shift.  No CT evidence of acute infarction.  Subcortical white matter and periventricular small vessel ischemic changes.  Intracranial atherosclerosis.  Age appropriate global cortical atrophy.  Secondary ventricular prominence.  The visualized paranasal sinuses are essentially clear. The mastoid air cells are unopacified.  No evidence of calvarial fracture.  IMPRESSION: No evidence of acute intracranial abnormality.  Age related atrophy.  Small vessel ischemic changes with  intracranial atherosclerosis.  CT CERVICAL SPINE  Findings: Reversal of the normal cervical lordosis.  No evidence of fracture or dislocation.  The vertebral body heights are maintained. Dens appears intact.  No prevertebral soft tissue swelling.  Moderate multilevel degenerative changes.  3 mm anterolisthesis of C3 on C4.  Visualized left lung apex is  notable for a 3 mm nodule (series 5/image 78).  IMPRESSION:  No evidence of traumatic injury to the cervical spine.  Moderate multilevel degenerative changes.  3 mm anterolisthesis of C3 on C4.  3 mm left upper lobe nodule.  If this patient is high risk for bronchogenic carcinoma, follow-up CT chest is suggested in 12 months.  Otherwise, no dedicated follow-up imaging is required per Fleischner Society guidelines.  This recommendation follows the consensus statement: Guidelines for Management of Small Pulmonary Nodules Detected on CT Scans:  A Statement from the [place] as published in Radiology 2005; 237:395-400.  Original Report Authenticated By: [name] Hy, M.D.   Ct Cervical Spine Wo Contrast  06/26/2012  *RADIOLOGY REPORT*  Clinical Data:  Fall 1 month ago, increased left neck pain, on blood thinners  CT HEAD WITHOUT CONTRAST CT CERVICAL SPINE WITHOUT CONTRAST  Technique:  Multidetector CT imaging of the head and cervical spine was performed following the standard protocol without intravenous contrast.  Multiplanar CT image reconstructions of the cervical spine were also generated.  Comparison:  CT head dated 01/11/2011  CT HEAD  Findings: Motion degraded images.  No evidence of parenchymal hemorrhage or extra-axial fluid collection. No mass lesion, mass effect, or midline shift.  No CT evidence of acute infarction.  Subcortical white matter and periventricular small vessel ischemic changes.  Intracranial atherosclerosis.  Age appropriate global cortical atrophy.  Secondary ventricular prominence.  The visualized paranasal sinuses are  essentially clear. The mastoid air cells are unopacified.  No evidence of calvarial fracture.  IMPRESSION: No evidence of acute intracranial abnormality.  Age related atrophy.  Small vessel ischemic changes with intracranial atherosclerosis.  CT CERVICAL SPINE  Findings: Reversal of the normal cervical lordosis.  No evidence of fracture or dislocation.  The vertebral body heights are maintained. Dens appears intact.  No prevertebral soft tissue swelling.  Moderate multilevel degenerative changes.  3 mm anterolisthesis of C3 on C4.  Visualized left lung apex is notable for a 3 mm nodule (series 5/image 78).  IMPRESSION:  No evidence of traumatic injury to the cervical spine.  Moderate multilevel degenerative changes.  3 mm anterolisthesis of C3 on C4.  3 mm left upper lobe nodule.  If this patient is high risk for bronchogenic carcinoma, follow-up CT chest is suggested in 12 months.  Otherwise, no dedicated follow-up imaging is required per Fleischner Society guidelines.  This recommendation follows the consensus statement: Guidelines for Management of Small Pulmonary Nodules Detected on CT Scans:  A Statement from the [place] as published in Radiology 2005; 237:395-400.  Original Report Authenticated By: [name] Hy, M.D.     MDM   Reports BRBPR to Dr. Lenoria Chime. However she was hemoccult negative and reports Dr. Vista Lawman ordered a colonoscopy last week. States that this was negative. Reports she has close follow-up regarding her blood in stool. States today she come due to her concern over her left sided neck pain. CT head and neck normal. Advised discussing pain with her PCP if it continues Pt voices understanding and is ready for d/c     Sheliah Mends, PA-C 06/26/12 1525

## 2012-06-26 NOTE — ED Notes (Signed)
Attempted to draw blood from right [facility]- pt anxious, tensing up arm. Veins rolling. Unsuccessful attempt.

## 2012-06-26 NOTE — ED Notes (Signed)
Pt presents left sided neck pain. States fell at home alone about a month ago, seemed to "black out" almost. Hit head on tub which caused it to bleed. At that time pt had home care nurses and the nurse told her since the bleeding had stopped she did not need to be checked.. Pt continues to have pain and discomfort left side of neck and sore place on back of head.

## 2012-06-26 NOTE — ED Notes (Signed)
States has neck pain, from fall 1 month ago- has small lump on right side of neck, posteriorly, drove self to hospital

## 2012-06-26 NOTE — ED Provider Notes (Signed)
Medical screening examination/treatment/procedure(s) were conducted as a shared visit with non-physician practitioner(s) and myself.  I personally evaluated the patient during the encounter  Pt with mechanical fall a while back comes in with neck pain. No neurologic deficits. Initially, i thought about possible carotid dissection, but with normal exam, pain being not severe, ano stiffness in the neck, the pretest probability for this condition is low, and the treatment would be anticoagulation - which is what she is on anyway. Case discussed with patient's pcp -will be seen on Monday by him.  Varney Biles, MD 06/26/12 1757

## 2012-06-26 NOTE — ED Notes (Signed)
RN to obtain labs with start of IV 

## 2012-06-30 DIAGNOSIS — M67919 Unspecified disorder of synovium and tendon, unspecified shoulder: Secondary | ICD-10-CM | POA: Diagnosis not present

## 2012-06-30 DIAGNOSIS — M719 Bursopathy, unspecified: Secondary | ICD-10-CM | POA: Diagnosis not present

## 2012-06-30 DIAGNOSIS — R195 Other fecal abnormalities: Secondary | ICD-10-CM | POA: Diagnosis not present

## 2012-06-30 DIAGNOSIS — K625 Hemorrhage of anus and rectum: Secondary | ICD-10-CM | POA: Diagnosis not present

## 2012-06-30 DIAGNOSIS — C20 Malignant neoplasm of rectum: Secondary | ICD-10-CM | POA: Diagnosis not present

## 2012-07-02 DIAGNOSIS — S43429A Sprain of unspecified rotator cuff capsule, initial encounter: Secondary | ICD-10-CM | POA: Diagnosis not present

## 2012-07-07 DIAGNOSIS — S43429A Sprain of unspecified rotator cuff capsule, initial encounter: Secondary | ICD-10-CM | POA: Diagnosis not present

## 2012-07-14 DIAGNOSIS — M75120 Complete rotator cuff tear or rupture of unspecified shoulder, not specified as traumatic: Secondary | ICD-10-CM | POA: Diagnosis not present

## 2012-07-16 DIAGNOSIS — I10 Essential (primary) hypertension: Secondary | ICD-10-CM | POA: Diagnosis not present

## 2012-07-16 DIAGNOSIS — Z01811 Encounter for preprocedural respiratory examination: Secondary | ICD-10-CM | POA: Diagnosis not present

## 2012-07-16 DIAGNOSIS — E538 Deficiency of other specified B group vitamins: Secondary | ICD-10-CM | POA: Diagnosis not present

## 2012-07-16 DIAGNOSIS — E119 Type 2 diabetes mellitus without complications: Secondary | ICD-10-CM | POA: Diagnosis not present

## 2012-07-16 DIAGNOSIS — G609 Hereditary and idiopathic neuropathy, unspecified: Secondary | ICD-10-CM | POA: Diagnosis not present

## 2012-07-16 DIAGNOSIS — G2589 Other specified extrapyramidal and movement disorders: Secondary | ICD-10-CM | POA: Diagnosis not present

## 2012-07-16 DIAGNOSIS — Z01810 Encounter for preprocedural cardiovascular examination: Secondary | ICD-10-CM | POA: Diagnosis not present

## 2012-07-16 DIAGNOSIS — I824Z9 Acute embolism and thrombosis of unspecified deep veins of unspecified distal lower extremity: Secondary | ICD-10-CM | POA: Diagnosis not present

## 2012-07-26 ENCOUNTER — Encounter (HOSPITAL_COMMUNITY): Payer: Self-pay | Admitting: Pharmacy Technician

## 2012-07-28 NOTE — H&P (Signed)
[name] DOB: 07-20-1919  Chief Complaint: left shoulder pain  History of Present Illness [name] is scheduled for an open left shoulder rotator cuff repair with Dr. Gladstone Lighter on Wednesday August 04, 2012. She presented with a chief complaint of left shoulder pain. She reports that this has been going on for the last 2 months. No injury that she can recall. She's having pain in the left shoulder. It does radiate towards the neck and down the arm at times. No numbness or tingling. No weakness in the arm. No symptoms in the right shoulder or arm. She does deny an injury to her arm but she does report that a few days after the arm started hurting, she did have an incident when she fainted at home and hit her head. She denies any lasting problems from this incident. Once again, she denies falling or injuring her arm and shoulder. She does state that with some certain movements she has pain such as overhead but a lot of times she just has pain while sitting at rest. She has had to take some pain medications recently for this problem. She has had cortisone injections which helped minimally. She did finally admit to me she had a fall prior to June. MRI revealed rotator cuff tear in the left shoulder.    Problem List/Past MedicalHistory Rotator Cuff Impingement Syndrome (726.10) Spondylolisthesis, acquired (738.4).  Diabetes Mellitus, Type II Polymyalgia rheumatica Hypertension Diverticulosis GERD OAB DVT, left leg Hyperlipidemia Sleep Apnea Restless leg syndrome DM type II Cervicalgia (723.1).  Sprain/strain, rotator cuff (840.4).  Syndrome, rotator cuff NOS (726.10).  Epicondylitis, lateral (726.32).  Disorder, shoulder region NEC (726.2). Osteoarthrosis NOS, lower leg (715.96).  Lumbago (724.2).  Spondylolisthesis, acquired (738.4).   Allergies Penicillin    Social History Exercise. Exercises daily; does running / walking Alcohol use. never consumed  alcohol Children. 1 Living situation. live alone Tobacco use. never smoker Marital status. widowed   Past Surgical History Spinal Surgery Mammoplasty; Reduction. bilateral Rotator Cuff Repair. right   Review of Systems General:Present- Fatigue. Not Present- Chills, Fever, Night Sweats, Appetite Loss, Feeling sick, Weight Gain and Weight Loss. Skin:Present- Itching and Change in Hair or Nails. Not Present- Rash, Skin Color Changes, Ulcer and Psoriasis. HEENT:Present- Sensitivity to light, Hearing problems and Ringing in the Ears. Not Present- Nose Bleed. Neck:Not Present- Swollen Glands and Neck Mass. Respiratory:Present- Dyspnea. Not Present- Snoring, Chronic Cough and Bloody sputum. Cardiovascular:Present- Swelling of Extremities. Not Present- Shortness of Breath, Chest Pain, Leg Cramps and Palpitations. Gastrointestinal:Not Present- Bloody Stool, Heartburn, Abdominal Pain, Vomiting, Nausea and Incontinence of Stool. Female Genitourinary:Present- Frequency. Not Present- Blood in Urine, Menstrual Irregularities, Incontinence and Nocturia. Musculoskeletal:Present- Back Pain and Joint Pain. Not Present- Muscle Weakness, Muscle Pain, Joint Stiffness, and Joint Swelling. Neurological:Present- Numbness. Not Present- Tingling, Burning, Tremor, Headaches and Dizziness. Psychiatric:Not Present- Anxiety, Depression and Memory Loss. Endocrine:Not Present- Cold Intolerance, Heat Intolerance, Excessive hunger and Excessive Thirst. Hematology:Not Present- Abnormal Bleeding, Anemia, Blood Clots and Easy Bruising.   Vitals Weight: 136 lb Height: 62 in Body Surface Area: 1.64 m Body Mass Index: 24.87 kg/m BP: 148/75 (Sitting, Left Arm, Standard)    Physical Exam On physical exam, she's alert and oriented, in no acute distress. Grip strength is 5/5 bilaterally. Sensation is intact bilaterally, and upper extremity motor strength is good. Sensation is intact.  Radial pulse is 2+. She has normal, painless ROM of the elbows and wrists. Right shoulder is nontender. Normal ROM. She has normal ROM with the left shoulder  but some increased pain with overhead movements. No pain with neck ROM. No masses or tumors palpated in the axillary region. The cervical spine is nontender to palpation. Lungs are clear. Heart normal sinus rhythm, no murmur. No masses or tumors about the neck. Abdomen soft and nontender. EOM intact.    RADIOGRAPHS: On the x-rays of her shoulder, she has some arthritic changes in the shoulder. No fractures are noted in the shoulder or the humerus. Chest looks okay. Ribs look good as well. The humeral head is riding high in the shoulder, though.     Assessment and Plan Complete left shoulder rotator cuff tear Left shoulder open rotator cuff repair      Ardeen Jourdain, PA-C

## 2012-07-29 NOTE — Pre-Procedure Instructions (Signed)
ekg 05-27-2012 dr osei-bonsu and medical clearane note dr osei-bonsu on chart

## 2012-07-30 ENCOUNTER — Encounter (HOSPITAL_COMMUNITY)
Admission: RE | Admit: 2012-07-30 | Discharge: 2012-07-30 | Disposition: A | Discharge status: Home / Self Care | Payer: Medicare Other | Source: Ambulatory Visit | Attending: Orthopedic Surgery | Admitting: Orthopedic Surgery

## 2012-07-30 ENCOUNTER — Encounter (HOSPITAL_COMMUNITY): Payer: Self-pay

## 2012-07-30 DIAGNOSIS — Z01812 Encounter for preprocedural laboratory examination: Secondary | ICD-10-CM | POA: Diagnosis not present

## 2012-07-30 DIAGNOSIS — E785 Hyperlipidemia, unspecified: Secondary | ICD-10-CM | POA: Diagnosis not present

## 2012-07-30 DIAGNOSIS — E119 Type 2 diabetes mellitus without complications: Secondary | ICD-10-CM | POA: Diagnosis not present

## 2012-07-30 DIAGNOSIS — Z86718 Personal history of other venous thrombosis and embolism: Secondary | ICD-10-CM | POA: Diagnosis not present

## 2012-07-30 DIAGNOSIS — Z79899 Other long term (current) drug therapy: Secondary | ICD-10-CM | POA: Diagnosis not present

## 2012-07-30 DIAGNOSIS — I10 Essential (primary) hypertension: Secondary | ICD-10-CM | POA: Diagnosis not present

## 2012-07-30 DIAGNOSIS — G4730 Sleep apnea, unspecified: Secondary | ICD-10-CM | POA: Diagnosis not present

## 2012-07-30 DIAGNOSIS — S43429A Sprain of unspecified rotator cuff capsule, initial encounter: Secondary | ICD-10-CM | POA: Diagnosis not present

## 2012-07-30 DIAGNOSIS — M353 Polymyalgia rheumatica: Secondary | ICD-10-CM | POA: Diagnosis not present

## 2012-07-30 DIAGNOSIS — K219 Gastro-esophageal reflux disease without esophagitis: Secondary | ICD-10-CM | POA: Diagnosis not present

## 2012-07-30 DIAGNOSIS — M25819 Other specified joint disorders, unspecified shoulder: Secondary | ICD-10-CM | POA: Diagnosis not present

## 2012-07-30 LAB — APTT: aPTT: 32 seconds (ref 24–37)

## 2012-07-30 LAB — URINALYSIS, ROUTINE W REFLEX MICROSCOPIC
Bilirubin Urine: NEGATIVE
Glucose, UA: NEGATIVE mg/dL
Hgb urine dipstick: NEGATIVE
Ketones, ur: NEGATIVE mg/dL
Nitrite: POSITIVE — AB
Protein, ur: NEGATIVE mg/dL
Specific Gravity, Urine: 1.03 (ref 1.005–1.030)
Urobilinogen, UA: 0.2 mg/dL (ref 0.0–1.0)
pH: 5 (ref 5.0–8.0)

## 2012-07-30 LAB — URINE MICROSCOPIC-ADD ON

## 2012-07-30 LAB — COMPREHENSIVE METABOLIC PANEL
ALT: 13 U/L (ref 0–35)
AST: 19 U/L (ref 0–37)
Albumin: 3.7 g/dL (ref 3.5–5.2)
Alkaline Phosphatase: 98 U/L (ref 39–117)
BUN: 32 mg/dL — ABNORMAL HIGH (ref 6–23)
CO2: 30 mEq/L (ref 19–32)
Calcium: 10 mg/dL (ref 8.4–10.5)
Chloride: 103 mEq/L (ref 96–112)
Creatinine, Ser: 0.88 mg/dL (ref 0.50–1.10)
GFR calc Af Amer: 65 mL/min — ABNORMAL LOW (ref >90)
GFR calc non Af Amer: 56 mL/min — ABNORMAL LOW (ref >90)
Glucose, Bld: 108 mg/dL — ABNORMAL HIGH (ref 70–99)
Potassium: 5.3 mEq/L — ABNORMAL HIGH (ref 3.5–5.1)
Sodium: 139 mEq/L (ref 135–145)
Total Bilirubin: 0.3 mg/dL (ref 0.3–1.2)
Total Protein: 6.6 g/dL (ref 6.0–8.3)

## 2012-07-30 LAB — CBC
MCHC: 32.2 g/dL (ref 30.0–36.0)
MCV: 91 fL (ref 78.0–100.0)
Platelets: 259 10*3/uL (ref 150–400)
RDW: 13.1 % (ref 11.5–15.5)
WBC: 5.9 10*3/uL (ref 4.0–10.5)

## 2012-07-30 LAB — PROTIME-INR
INR: 0.93 (ref 0.00–1.49)
Prothrombin Time: 12.7 seconds (ref 11.6–15.2)

## 2012-07-30 LAB — SURGICAL PCR SCREEN: Staphylococcus aureus: NEGATIVE

## 2012-07-30 NOTE — Patient Instructions (Addendum)
22 [name]  07/30/2012   Your procedure is scheduled on:  08-04-2012  Report to [place] at  AM.  Call this number if you have problems the morning of surgery: [phone]   Remember: stop xarelto 24 hours before surgery per dr Linna Caprice instructions   Do not eat food or drink liquids:After Midnight.  .  Take these medicines the morning of surgery with A SIP OF WATER: cymbalta, percocet if needed   Do not wear jewelry or make up.  Do not wear lotions, powders, or perfumes.Do not wear deodorant.    Do not bring valuables to the hospital.  Contacts, dentures or bridgework may not be worn into surgery.  Leave suitcase in the car. After surgery it may be brought to your room.  For patients admitted to the hospital, checkout time is 11:00 AM the day of discharge                             Patients discharged the day of surgery will not be allowed to drive home. If going home same day of surgery, you must have someone stay with you the first 24 hours at home and arrange for some one to drive you home from hospital.    Special Instructions: CHG Shower Use Special Wash: 1/2 bottle night before surgery and 1/2 bottle morning  of surgery, use regular soap on face and front and back private area. Women do not shave legs or underarms for 2 days before showers. Men may shave face morning of surgery.    Please read over the following fact sheets that you were given: [facility] pre op nurse phone number [phone], call if needed

## 2012-08-02 NOTE — Progress Notes (Signed)
Abnormal CMET results and U/A and microscopic urine results Faxed to Dr. Emiliano Dyer and conformation sheet placed on chart.

## 2012-08-04 ENCOUNTER — Encounter (HOSPITAL_COMMUNITY): Payer: Self-pay | Admitting: Anesthesiology

## 2012-08-04 ENCOUNTER — Encounter (HOSPITAL_COMMUNITY): Payer: Self-pay | Admitting: *Deleted

## 2012-08-04 ENCOUNTER — Ambulatory Visit (HOSPITAL_COMMUNITY): Payer: Medicare Other | Admitting: Anesthesiology

## 2012-08-04 ENCOUNTER — Encounter (HOSPITAL_COMMUNITY)
Admission: RE | Disposition: A | Discharge status: Home / Self Care | Payer: Self-pay | Source: Ambulatory Visit | Attending: Orthopedic Surgery

## 2012-08-04 ENCOUNTER — Ambulatory Visit (HOSPITAL_COMMUNITY)
Admission: RE | Admit: 2012-08-04 | Discharge: 2012-08-05 | Disposition: A | Discharge status: Home / Self Care | Payer: Medicare Other | Source: Ambulatory Visit | Attending: Orthopedic Surgery | Admitting: Orthopedic Surgery

## 2012-08-04 DIAGNOSIS — Z01812 Encounter for preprocedural laboratory examination: Secondary | ICD-10-CM | POA: Insufficient documentation

## 2012-08-04 DIAGNOSIS — M25819 Other specified joint disorders, unspecified shoulder: Secondary | ICD-10-CM | POA: Diagnosis not present

## 2012-08-04 DIAGNOSIS — E785 Hyperlipidemia, unspecified: Secondary | ICD-10-CM | POA: Insufficient documentation

## 2012-08-04 DIAGNOSIS — S43429A Sprain of unspecified rotator cuff capsule, initial encounter: Secondary | ICD-10-CM | POA: Insufficient documentation

## 2012-08-04 DIAGNOSIS — M353 Polymyalgia rheumatica: Secondary | ICD-10-CM | POA: Diagnosis not present

## 2012-08-04 DIAGNOSIS — M75122 Complete rotator cuff tear or rupture of left shoulder, not specified as traumatic: Secondary | ICD-10-CM | POA: Diagnosis present

## 2012-08-04 DIAGNOSIS — G4730 Sleep apnea, unspecified: Secondary | ICD-10-CM | POA: Insufficient documentation

## 2012-08-04 DIAGNOSIS — M75120 Complete rotator cuff tear or rupture of unspecified shoulder, not specified as traumatic: Secondary | ICD-10-CM | POA: Diagnosis not present

## 2012-08-04 DIAGNOSIS — E119 Type 2 diabetes mellitus without complications: Secondary | ICD-10-CM | POA: Diagnosis not present

## 2012-08-04 DIAGNOSIS — Z79899 Other long term (current) drug therapy: Secondary | ICD-10-CM | POA: Insufficient documentation

## 2012-08-04 DIAGNOSIS — I10 Essential (primary) hypertension: Secondary | ICD-10-CM | POA: Diagnosis not present

## 2012-08-04 DIAGNOSIS — Z86718 Personal history of other venous thrombosis and embolism: Secondary | ICD-10-CM | POA: Insufficient documentation

## 2012-08-04 DIAGNOSIS — K219 Gastro-esophageal reflux disease without esophagitis: Secondary | ICD-10-CM | POA: Insufficient documentation

## 2012-08-04 DIAGNOSIS — X58XXXA Exposure to other specified factors, initial encounter: Secondary | ICD-10-CM | POA: Insufficient documentation

## 2012-08-04 HISTORY — PX: SHOULDER OPEN ROTATOR CUFF REPAIR: SHX2407

## 2012-08-04 LAB — GLUCOSE, CAPILLARY
Glucose-Capillary: 91 mg/dL (ref 70–99)
Glucose-Capillary: 176 mg/dL — ABNORMAL HIGH (ref 70–99)

## 2012-08-04 SURGERY — REPAIR, ROTATOR CUFF, OPEN
Anesthesia: General | Site: Shoulder | Laterality: Left | Wound class: Clean

## 2012-08-04 MED ORDER — ROPINIROLE HCL ER 6 MG PO TB24: 6.0000 mg | ORAL_TABLET | Freq: Every day | ORAL | Status: DC

## 2012-08-04 MED ORDER — PHENOL 1.4 % MT LIQD
1.0000 | OROMUCOSAL | Status: DC | PRN
  Filled 2012-08-04: qty 177

## 2012-08-04 MED ORDER — MENTHOL 3 MG MT LOZG
1.0000 | LOZENGE | OROMUCOSAL | Status: DC | PRN
  Filled 2012-08-04: qty 9

## 2012-08-04 MED ORDER — ONDANSETRON HCL 4 MG/2ML IJ SOLN: 4.0000 mg | Freq: Four times a day (QID) | INTRAMUSCULAR | Status: DC | PRN

## 2012-08-04 MED ORDER — ROPINIROLE HCL 1 MG PO TABS
2.0000 mg | ORAL_TABLET | Freq: Once | ORAL | Status: AC
  Administered 2012-08-04: 2 mg via ORAL
  Filled 2012-08-04: qty 2

## 2012-08-04 MED ORDER — FLEET ENEMA 7-19 GM/118ML RE ENEM: 1.0000 | ENEMA | Freq: Once | RECTAL | Status: AC | PRN

## 2012-08-04 MED ORDER — GLYCOPYRROLATE 0.2 MG/ML IJ SOLN
INTRAMUSCULAR | Status: DC | PRN
  Administered 2012-08-04: 0.4 mg via INTRAVENOUS

## 2012-08-04 MED ORDER — KETAMINE HCL 10 MG/ML IJ SOLN
INTRAMUSCULAR | Status: DC | PRN
  Administered 2012-08-04 (×2): 1 mg via INTRAVENOUS
  Administered 2012-08-04: 25 mg via INTRAVENOUS
  Administered 2012-08-04 (×2): 1 mg via INTRAVENOUS

## 2012-08-04 MED ORDER — LACTATED RINGERS IV SOLN
INTRAVENOUS | Status: DC
  Administered 2012-08-04 – 2012-08-05 (×2): via INTRAVENOUS

## 2012-08-04 MED ORDER — THROMBIN 5000 UNITS EX SOLR
CUTANEOUS | Status: AC
  Filled 2012-08-04: qty 5000

## 2012-08-04 MED ORDER — SODIUM CHLORIDE 0.9 % IR SOLN
Status: DC | PRN
  Administered 2012-08-04: 11:00:00

## 2012-08-04 MED ORDER — INSULIN ASPART 100 UNIT/ML ~~LOC~~ SOLN
0.0000 [IU] | Freq: Three times a day (TID) | SUBCUTANEOUS | Status: DC
  Administered 2012-08-04: 3 [IU] via SUBCUTANEOUS

## 2012-08-04 MED ORDER — METFORMIN HCL 500 MG PO TABS
500.0000 mg | ORAL_TABLET | Freq: Every day | ORAL | Status: DC
  Administered 2012-08-05: 500 mg via ORAL
  Filled 2012-08-04 (×2): qty 1

## 2012-08-04 MED ORDER — ROPINIROLE HCL ER 6 MG PO TB24
6.0000 mg | ORAL_TABLET | Freq: Every day | ORAL | Status: DC
  Administered 2012-08-04: 6 mg via ORAL
  Filled 2012-08-04: qty 2

## 2012-08-04 MED ORDER — DULOXETINE HCL 30 MG PO CPEP
30.0000 mg | ORAL_CAPSULE | Freq: Every morning | ORAL | Status: DC
  Administered 2012-08-05: 30 mg via ORAL
  Filled 2012-08-04: qty 1

## 2012-08-04 MED ORDER — NEOSTIGMINE METHYLSULFATE 1 MG/ML IJ SOLN
INTRAMUSCULAR | Status: DC | PRN
  Administered 2012-08-04: 4 mg via INTRAVENOUS

## 2012-08-04 MED ORDER — BISACODYL 10 MG RE SUPP: 10.0000 mg | Freq: Every day | RECTAL | Status: DC | PRN

## 2012-08-04 MED ORDER — CLINDAMYCIN PHOSPHATE 900 MG/50ML IV SOLN
900.0000 mg | INTRAVENOUS | Status: AC
  Administered 2012-08-04: 900 mg via INTRAVENOUS

## 2012-08-04 MED ORDER — THROMBIN 5000 UNITS EX SOLR
CUTANEOUS | Status: DC | PRN
  Administered 2012-08-04: 5000 [IU] via TOPICAL

## 2012-08-04 MED ORDER — PROPOFOL 10 MG/ML IV BOLUS
INTRAVENOUS | Status: DC | PRN
  Administered 2012-08-04: 50 mg via INTRAVENOUS
  Administered 2012-08-04: 100 mg via INTRAVENOUS
  Administered 2012-08-04: 50 mg via INTRAVENOUS

## 2012-08-04 MED ORDER — HYDRALAZINE HCL 20 MG/ML IJ SOLN
INTRAMUSCULAR | Status: DC | PRN
  Administered 2012-08-04: 2 mg via INTRAVENOUS

## 2012-08-04 MED ORDER — CLINDAMYCIN PHOSPHATE 900 MG/50ML IV SOLN
INTRAVENOUS | Status: AC
  Filled 2012-08-04: qty 50

## 2012-08-04 MED ORDER — DEXAMETHASONE SODIUM PHOSPHATE 4 MG/ML IJ SOLN
INTRAMUSCULAR | Status: DC | PRN
  Administered 2012-08-04: 10 mg via INTRAVENOUS

## 2012-08-04 MED ORDER — METHOCARBAMOL 100 MG/ML IJ SOLN
500.0000 mg | Freq: Four times a day (QID) | INTRAVENOUS | Status: DC | PRN
  Administered 2012-08-05: 500 mg via INTRAVENOUS
  Filled 2012-08-04: qty 5

## 2012-08-04 MED ORDER — ROCURONIUM BROMIDE 100 MG/10ML IV SOLN
INTRAVENOUS | Status: DC | PRN
  Administered 2012-08-04: 30 mg via INTRAVENOUS
  Administered 2012-08-04: 20 mg via INTRAVENOUS

## 2012-08-04 MED ORDER — ONDANSETRON HCL 4 MG PO TABS: 4.0000 mg | ORAL_TABLET | Freq: Four times a day (QID) | ORAL | Status: DC | PRN

## 2012-08-04 MED ORDER — ACETAMINOPHEN 10 MG/ML IV SOLN
INTRAVENOUS | Status: DC | PRN
  Administered 2012-08-04: 1000 mg via INTRAVENOUS

## 2012-08-04 MED ORDER — FENTANYL CITRATE 0.05 MG/ML IJ SOLN
INTRAMUSCULAR | Status: DC | PRN
  Administered 2012-08-04 (×2): 50 ug via INTRAVENOUS

## 2012-08-04 MED ORDER — CLINDAMYCIN PHOSPHATE 600 MG/50ML IV SOLN
600.0000 mg | Freq: Four times a day (QID) | INTRAVENOUS | Status: AC
  Administered 2012-08-04 – 2012-08-05 (×3): 600 mg via INTRAVENOUS
  Filled 2012-08-04 (×3): qty 50

## 2012-08-04 MED ORDER — METOCLOPRAMIDE HCL 5 MG/ML IJ SOLN
INTRAMUSCULAR | Status: DC | PRN
  Administered 2012-08-04: 10 mg via INTRAVENOUS

## 2012-08-04 MED ORDER — HYDROCODONE-ACETAMINOPHEN 5-325 MG PO TABS
1.0000 | ORAL_TABLET | ORAL | Status: DC | PRN
  Administered 2012-08-05: 1 via ORAL
  Filled 2012-08-04: qty 1

## 2012-08-04 MED ORDER — BACITRACIN-NEOMYCIN-POLYMYXIN 400-5-5000 EX OINT
TOPICAL_OINTMENT | CUTANEOUS | Status: AC
  Filled 2012-08-04: qty 1

## 2012-08-04 MED ORDER — MIRTAZAPINE 15 MG PO TABS
15.0000 mg | ORAL_TABLET | Freq: Every day | ORAL | Status: DC
  Administered 2012-08-04: 15 mg via ORAL
  Filled 2012-08-04 (×2): qty 1

## 2012-08-04 MED ORDER — HYDROMORPHONE HCL PF 1 MG/ML IJ SOLN: 0.2500 mg | INTRAMUSCULAR | Status: DC | PRN

## 2012-08-04 MED ORDER — ONDANSETRON HCL 4 MG/2ML IJ SOLN
INTRAMUSCULAR | Status: DC | PRN
  Administered 2012-08-04: 4 mg via INTRAVENOUS

## 2012-08-04 MED ORDER — PHENYLEPHRINE HCL 10 MG/ML IJ SOLN
20.0000 mg | INTRAVENOUS | Status: DC | PRN
  Administered 2012-08-04: 70 ug/min via INTRAVENOUS
  Administered 2012-08-04: 50 ug/min via INTRAVENOUS

## 2012-08-04 MED ORDER — OXYCODONE-ACETAMINOPHEN 5-325 MG PO TABS: 1.0000 | ORAL_TABLET | ORAL | Status: DC | PRN

## 2012-08-04 MED ORDER — LACTATED RINGERS IV SOLN
INTRAVENOUS | Status: DC
  Administered 2012-08-04: 10:00:00 via INTRAVENOUS

## 2012-08-04 MED ORDER — ROPINIROLE HCL ER 6 MG PO TB24: 1.0000 | ORAL_TABLET | Freq: Every evening | ORAL | Status: DC

## 2012-08-04 MED ORDER — BUPIVACAINE LIPOSOME 1.3 % IJ SUSP
INTRAMUSCULAR | Status: DC | PRN
  Administered 2012-08-04: 15 mL

## 2012-08-04 MED ORDER — ACETAMINOPHEN 325 MG PO TABS: 650.0000 mg | ORAL_TABLET | Freq: Four times a day (QID) | ORAL | Status: DC | PRN

## 2012-08-04 MED ORDER — BACITRACIN-NEOMYCIN-POLYMYXIN 400-5-5000 EX OINT
TOPICAL_OINTMENT | CUTANEOUS | Status: DC | PRN
  Administered 2012-08-04: 1 via TOPICAL

## 2012-08-04 MED ORDER — ACETAMINOPHEN 650 MG RE SUPP: 650.0000 mg | Freq: Four times a day (QID) | RECTAL | Status: DC | PRN

## 2012-08-04 MED ORDER — BUPIVACAINE LIPOSOME 1.3 % IJ SUSP
20.0000 mL | INTRAMUSCULAR | Status: DC
  Filled 2012-08-04: qty 20

## 2012-08-04 MED ORDER — METHOCARBAMOL 500 MG PO TABS
500.0000 mg | ORAL_TABLET | Freq: Four times a day (QID) | ORAL | Status: DC | PRN
  Administered 2012-08-05: 500 mg via ORAL
  Filled 2012-08-04: qty 1

## 2012-08-04 MED ORDER — HYDROMORPHONE HCL PF 1 MG/ML IJ SOLN: 0.5000 mg | INTRAMUSCULAR | Status: DC | PRN

## 2012-08-04 MED ORDER — ACETAMINOPHEN 10 MG/ML IV SOLN
INTRAVENOUS | Status: AC
  Filled 2012-08-04: qty 100

## 2012-08-04 MED ORDER — POLYETHYLENE GLYCOL 3350 17 G PO PACK: 17.0000 g | PACK | Freq: Every day | ORAL | Status: DC | PRN

## 2012-08-04 MED ORDER — INSULIN ASPART 100 UNIT/ML ~~LOC~~ SOLN: 0.0000 [IU] | Freq: Three times a day (TID) | SUBCUTANEOUS | Status: DC

## 2012-08-04 MED ORDER — LACTATED RINGERS IV SOLN: INTRAVENOUS | Status: DC

## 2012-08-04 SURGICAL SUPPLY — 52 items
ANCH SUT 2 5.5 BABSR ASCP (Orthopedic Implant) ×2 IMPLANT
ANCHOR PEEK ZIP 5.5 NDL NO2 (Orthopedic Implant) ×2 IMPLANT
BAG SPEC THK2 15X12 ZIP CLS (MISCELLANEOUS) ×1
BAG ZIPLOCK 12X15 (MISCELLANEOUS) ×2 IMPLANT
BLADE OSCILLATING/SAGITTAL (BLADE) ×2
BLADE SW THK.38XMED LNG THN (BLADE) ×1 IMPLANT
BNDG COHESIVE 6X5 TAN NS LF (GAUZE/BANDAGES/DRESSINGS) IMPLANT
BUR OVAL CARBIDE 4.0 (BURR) ×2 IMPLANT
CLEANER TIP ELECTROSURG 2X2 (MISCELLANEOUS) ×2 IMPLANT
CLOTH BEACON ORANGE TIMEOUT ST (SAFETY) ×2 IMPLANT
DRAPE POUCH INSTRU U-SHP 10X18 (DRAPES) ×2 IMPLANT
DRSG EMULSION OIL 3X3 NADH (GAUZE/BANDAGES/DRESSINGS) ×2 IMPLANT
DRSG PAD ABDOMINAL 8X10 ST (GAUZE/BANDAGES/DRESSINGS) ×3 IMPLANT
DURAPREP 26ML APPLICATOR (WOUND CARE) ×2 IMPLANT
ELECT REM PT RETURN 9FT ADLT (ELECTROSURGICAL) ×2
ELECTRODE REM PT RTRN 9FT ADLT (ELECTROSURGICAL) ×1 IMPLANT
FLOSEAL 10ML (HEMOSTASIS) IMPLANT
GLOVE BIOGEL PI IND STRL 8 (GLOVE) ×1 IMPLANT
GLOVE BIOGEL PI IND STRL 8.5 (GLOVE) ×1 IMPLANT
GLOVE BIOGEL PI INDICATOR 8 (GLOVE) ×1
GLOVE BIOGEL PI INDICATOR 8.5 (GLOVE) ×1
GLOVE ECLIPSE 8.0 STRL XLNG CF (GLOVE) ×4 IMPLANT
GLOVE SURG SS PI 6.5 STRL IVOR (GLOVE) ×4 IMPLANT
GOWN PREVENTION PLUS LG XLONG (DISPOSABLE) ×4 IMPLANT
GOWN STRL REIN XL XLG (GOWN DISPOSABLE) ×4 IMPLANT
KIT BASIN OR (CUSTOM PROCEDURE TRAY) ×2 IMPLANT
MANIFOLD NEPTUNE II (INSTRUMENTS) ×2 IMPLANT
NDL MA TROC 1/2 (NEEDLE) IMPLANT
NEEDLE MA TROC 1/2 (NEEDLE) IMPLANT
NS IRRIG 1000ML POUR BTL (IV SOLUTION) IMPLANT
PACK SHOULDER CUSTOM OPM052 (CUSTOM PROCEDURE TRAY) ×2 IMPLANT
PASSER SUT SWANSON 36MM LOOP (INSTRUMENTS) IMPLANT
PATCH TISSUE MEND 3X3CM (Orthopedic Implant) ×1 IMPLANT
POSITIONER SURGICAL ARM (MISCELLANEOUS) ×1 IMPLANT
SLING ARM IMMOBILIZER LRG (SOFTGOODS) ×1 IMPLANT
SLING ARM IMMOBILIZER MED (SOFTGOODS) ×1 IMPLANT
SPONGE GAUZE 4X4 12PLY (GAUZE/BANDAGES/DRESSINGS) ×1 IMPLANT
SPONGE SURGIFOAM ABS GEL 100 (HEMOSTASIS) ×1 IMPLANT
STAPLER VISISTAT 35W (STAPLE) ×2 IMPLANT
STRIP CLOSURE SKIN 1/2X4 (GAUZE/BANDAGES/DRESSINGS) ×2 IMPLANT
SUCTION FRAZIER 12FR DISP (SUCTIONS) ×2 IMPLANT
SUT BONE WAX W31G (SUTURE) ×2 IMPLANT
SUT ETHIBOND NAB CT1 #1 30IN (SUTURE) IMPLANT
SUT MNCRL AB 4-0 PS2 18 (SUTURE) ×2 IMPLANT
SUT VIC AB 0 CT1 27 (SUTURE) ×2
SUT VIC AB 0 CT1 27XBRD ANTBC (SUTURE) ×1 IMPLANT
SUT VIC AB 1 CT1 27 (SUTURE) ×4
SUT VIC AB 1 CT1 27XBRD ANTBC (SUTURE) ×2 IMPLANT
SUT VIC AB 2-0 CT1 27 (SUTURE)
SUT VIC AB 2-0 CT1 27XBRD (SUTURE) IMPLANT
TAPE CLOTH SURG 4X10 WHT LF (GAUZE/BANDAGES/DRESSINGS) ×1 IMPLANT
TOWEL OR 17X26 10 PK STRL BLUE (TOWEL DISPOSABLE) ×4 IMPLANT

## 2012-08-04 NOTE — Interval H&P Note (Signed)
History and Physical Interval Note:  08/04/2012 9:47 AM  [name]  has presented today for surgery, with the diagnosis of left should rotator cuff tear  The various methods of treatment have been discussed with the patient and family. After consideration of risks, benefits and other options for treatment, the patient has consented to  Procedure(s) (LRB): ROTATOR CUFF REPAIR SHOULDER OPEN (Left) as a surgical intervention .  The patient's history has been reviewed, patient examined, no change in status, stable for surgery.  I have reviewed the patient's chart and labs.  Questions were answered to the patient's satisfaction.     Shivonne Schwartzman A

## 2012-08-04 NOTE — Anesthesia Preprocedure Evaluation (Signed)
Anesthesia Evaluation  Patient identified by MRN, date of birth, ID band Patient awake    Reviewed: Allergy & Precautions, H&P , NPO status , Patient's Chart, lab work & pertinent test results  Airway Mallampati: II TM Distance: >3 FB Neck ROM: full    Dental  (+) Missing and Dental Advisory Given Most of the front teeth are missing.  She says that the 3 front upper teeth present are not loose.:   Pulmonary neg pulmonary ROS,  breath sounds clear to auscultation  Pulmonary exam normal       Cardiovascular Exercise Tolerance: Good hypertension, negative cardio ROS  Rhythm:regular Rate:Normal     Neuro/Psych negative neurological ROS  negative psych ROS   GI/Hepatic negative GI ROS, Neg liver ROS, GERD-  Medicated and Controlled,  Endo/Other  negative endocrine ROSWell Controlled, Type 2, Oral Hypoglycemic Agents  Renal/GU negative Renal ROS  negative genitourinary   Musculoskeletal   Abdominal   Peds  Hematology negative hematology ROS (+)   Anesthesia Other Findings   Reproductive/Obstetrics negative OB ROS                           Anesthesia Physical Anesthesia Plan  ASA: III  Anesthesia Plan: General   Post-op Pain Management:    Induction: Intravenous  Airway Management Planned: Oral ETT  Additional Equipment:   Intra-op Plan:   Post-operative Plan: Extubation in OR  Informed Consent: I have reviewed the patients History and Physical, chart, labs and discussed the procedure including the risks, benefits and alternatives for the proposed anesthesia with the patient or authorized representative who has indicated his/her understanding and acceptance.   Dental Advisory Given  Plan Discussed with: CRNA and Surgeon  Anesthesia Plan Comments:         Anesthesia Quick Evaluation

## 2012-08-04 NOTE — Brief Op Note (Signed)
08/04/2012  11:47 AM  PATIENT:  [name]  78 y.o. female  PRE-OPERATIVE DIAGNOSIS:  left shoulder rotator cuff tear,Complex with Retraction  POST-OPERATIVE DIAGNOSIS:  left shoulder rotator cuff tear,Complex with Retraction.  PROCEDURE:  Procedure(s) (LRB): ROTATOR CUFF REPAIR SHOULDER OPEN (Left) with Tissue Mend Graft and two Church.  SURGEON:  Surgeon(s) and Role:    * Tobi Bastos, MD - Primary   ASSISTANTS: OR Techs.  ANESTHESIA:   general  EBL:  Total I/O In: 900 [I.V.:900] Out: 50 [Blood:50]  BLOOD ADMINISTERED:none  DRAINS: none   LOCAL MEDICATIONS USED:  BUPIVICAINE 10cc.  SPECIMEN:  No Specimen  DISPOSITION OF SPECIMEN:  N/A  COUNTS:  YES  TOURNIQUET:  * No tourniquets in log *  DICTATION: .Dragon Dictation and Other Dictation: Dictation Number [id number]  PLAN OF CARE: Admit for overnight observation  PATIENT DISPOSITION:  PACU - hemodynamically stable.   Delay start of Pharmacological VTE agent (>24hrs) due to surgical blood loss or risk of bleeding: yes

## 2012-08-04 NOTE — Preoperative (Signed)
Beta Blockers   Reason not to administer Beta Blockers:Not Applicable 

## 2012-08-04 NOTE — Anesthesia Postprocedure Evaluation (Signed)
  Anesthesia Post-op Note  Patient: [name]  Procedure(s) Performed: Procedure(s) (LRB): ROTATOR CUFF REPAIR SHOULDER OPEN (Left)  Patient Location: PACU  Anesthesia Type: General  Level of Consciousness: awake and alert   Airway and Oxygen Therapy: Patient Spontanous Breathing  Post-op Pain: mild  Post-op Assessment: Post-op Vital signs reviewed, Patient's Cardiovascular Status Stable, Respiratory Function Stable, Patent Airway and No signs of Nausea or vomiting  Post-op Vital Signs: stable  Complications: No apparent anesthesia complications

## 2012-08-04 NOTE — Transfer of Care (Signed)
Immediate Anesthesia Transfer of Care Note  Patient: [name]  Procedure(s) Performed: Procedure(s) (LRB): ROTATOR CUFF REPAIR SHOULDER OPEN (Left)  Patient Location: PACU  Anesthesia Type: General  Level of Consciousness: awake, alert  and patient cooperative  Airway & Oxygen Therapy: Patient Spontanous Breathing and Patient connected to face mask oxygen  Post-op Assessment: Report given to PACU RN, Post -op Vital signs reviewed and stable and Patient moving all extremities  Post vital signs: Reviewed and stable  Complications: No apparent anesthesia complications

## 2012-08-04 NOTE — Anesthesia Procedure Notes (Signed)
Procedure Name: Intubation Date/Time: 08/04/2012 10:33 AM Performed by: Heide Scales [name] Pre-anesthesia Checklist: Patient identified, Emergency Drugs available, Suction available and Patient being monitored Patient Re-evaluated:Patient Re-evaluated prior to inductionOxygen Delivery Method: Circle system utilized Preoxygenation: Pre-oxygenation with 100% oxygen Intubation Type: IV induction Ventilation: Mask ventilation without difficulty Grade View: Grade I Tube type: Parker flex tip Number of attempts: 3 Airway Equipment and Method: Video-laryngoscopy Placement Confirmation: ETT inserted through vocal cords under direct vision,  positive ETCO2 and breath sounds checked- equal and bilateral Secured at: 21 cm Tube secured with: Tape Dental Injury: Teeth and Oropharynx as per pre-operative assessment  Difficulty Due To: Difficulty was unanticipated, Difficult Airway- due to anterior larynx and Difficult Airway- due to dentition Comments: DVL x1 by CRNA with mac 3, grade 3 view, DVL x2 by MDA, attempt x1 with bougie, 2nd attempt with glidescope, grade 1 view with glidescope

## 2012-08-05 ENCOUNTER — Encounter (HOSPITAL_COMMUNITY): Payer: Self-pay | Admitting: Orthopedic Surgery

## 2012-08-05 DIAGNOSIS — M353 Polymyalgia rheumatica: Secondary | ICD-10-CM | POA: Diagnosis not present

## 2012-08-05 DIAGNOSIS — E119 Type 2 diabetes mellitus without complications: Secondary | ICD-10-CM | POA: Diagnosis not present

## 2012-08-05 DIAGNOSIS — I10 Essential (primary) hypertension: Secondary | ICD-10-CM | POA: Diagnosis not present

## 2012-08-05 DIAGNOSIS — Z01812 Encounter for preprocedural laboratory examination: Secondary | ICD-10-CM | POA: Diagnosis not present

## 2012-08-05 DIAGNOSIS — M25819 Other specified joint disorders, unspecified shoulder: Secondary | ICD-10-CM | POA: Diagnosis not present

## 2012-08-05 DIAGNOSIS — S43429A Sprain of unspecified rotator cuff capsule, initial encounter: Secondary | ICD-10-CM | POA: Diagnosis not present

## 2012-08-05 MED ORDER — METHOCARBAMOL 500 MG PO TABS: 500.0000 mg | ORAL_TABLET | Freq: Four times a day (QID) | ORAL | Status: AC

## 2012-08-05 MED ORDER — INSULIN ASPART 100 UNIT/ML ~~LOC~~ SOLN: 0.0000 [IU] | Freq: Three times a day (TID) | SUBCUTANEOUS | Status: DC

## 2012-08-05 MED ORDER — OXYCODONE-ACETAMINOPHEN 10-325 MG PO TABS: 1.0000 | ORAL_TABLET | ORAL | Status: AC | PRN

## 2012-08-05 MED ORDER — INSULIN ASPART 100 UNIT/ML ~~LOC~~ SOLN
0.0000 [IU] | Freq: Every day | SUBCUTANEOUS | Status: DC
  Administered 2012-08-05: 4 [IU] via SUBCUTANEOUS

## 2012-08-05 NOTE — Progress Notes (Signed)
Subjective: Doing well. No Problems this morning.   Objective: Vital signs in last 24 hours: Temp:  [96.8 F (36 C)-98.2 F (36.8 C)] 98.2 F (36.8 C) (08/29 0624) Pulse Rate:  [53-83] 56  (08/29 0624) Resp:  [14-23] 16  (08/29 0624) BP: (117-167)/(52-80) 123/66 mmHg (08/29 0624) SpO2:  [94 %-100 %] 100 % (08/29 0624) Weight:  [59.875 kg (132 lb)] 59.875 kg (132 lb) (08/28 1345)  Intake/Output from previous day: 08/28 0701 - 08/29 0700 In: 2871.3 [P.O.:870; I.V.:1951.3; IV Piggyback:50] Out: 50 [Blood:50] Intake/Output this shift:    No results found for this basename: HGB:5 in the last 72 hours No results found for this basename: WBC:2,RBC:2,HCT:2,PLT:2 in the last 72 hours No results found for this basename: NA:2,K:2,CL:2,CO2:2,BUN:2,CREATININE:2,GLUCOSE:2,CALCIUM:2 in the last 72 hours No results found for this basename: LABPT:2,INR:2 in the last 72 hours  Neurovascular intact  Assessment/Plan: DC Today.   [name] A 08/05/2012, 7:11 AM

## 2012-08-05 NOTE — Progress Notes (Signed)
OT Note Eval completed and filed in shadow chart. All education completed. Pt will have necessary ADL assist upon d/c. Progress rehab of the shoulder as ordered by MD post f/u visit. Feel pt would also benefit from PT eval before d/c. Shoulder injury was the result of a fall. Pt uses RW and is very unsteady without it. OT will sign off at this time.  Bernell List, OTR/L  Pager [phone] 08/05/2012

## 2012-08-05 NOTE — Discharge Summary (Signed)
Physician Discharge Summary  Patient ID: [name] MRN: [id number] DOB/AGE: Apr 21, 1919 78 y.o.  Admit date: 08/04/2012 Discharge date: 08/05/2012  Admission Diagnoses:Rotator cuff Tear  Discharge Diagnoses:  Active Problems:  Complete rotator cuff rupture of left shoulder   Discharged Condition:improved  Hospital Course: No Complications  Consults: None  Significant Diagnostic Studies: labs  Treatments: antibiotics: Clindamycin  Discharge Exam: Blood pressure 123/66, pulse 56, temperature 98.2 F (36.8 C), temperature source Oral, resp. rate 16, height 5' 2"  (1.575 m), weight 59.875 kg (132 lb), SpO2 100.00%. Extremities: Good circulation in Hand  Disposition: 01-Home or Self Care  Discharge Orders    Future Orders Please Complete By Expires   Call MD / Call 911      Comments:   If you experience chest pain or shortness of breath, CALL 911 and be transported to the hospital emergency room.  If you develope a fever above 101 F, pus (white drainage) or increased drainage or redness at the wound, or calf pain, call your surgeon's office.   Increase activity slowly as tolerated      Discharge instructions      Comments:   Keep your sling on at all times, including sleeping in your sling.  The only time you should remove your sling is to shower only but you need to keep your hand against your chest while you shower.   For the first few days, remove your dressing, tape a piece of saran wrap over your incision, take your shower, then remove the saran wrap and put a clean dressing on, then reapply your sling.  After two days you can shower without the saran wrap.   Call Dr. Gladstone Lighter if any wound complications or temperature of 101 degrees F or over.   Call the office for an appointment to see Dr. Gladstone Lighter in two weeks: [phone] and ask for Dr. Charlestine Night nurse, Brunilda Payor.   Driving restrictions      Comments:   No driving for 6 weeks     Medication List  As of  08/05/2012  7:25 AM   STOP taking these medications         oxyCODONE-acetaminophen 5-325 MG per tablet         TAKE these medications         DULoxetine 30 MG capsule   Commonly known as: CYMBALTA   Take 30 mg by mouth every morning.      ergocalciferol 50000 UNITS capsule   Commonly known as: VITAMIN D2   Take 50,000 Units by mouth 2 (two) times a week. Take twice weekly on Wednesday and Sunday      metFORMIN 500 MG tablet   Commonly known as: GLUCOPHAGE   Take 500 mg by mouth daily with breakfast.      methocarbamol 500 MG tablet   Commonly known as: ROBAXIN   Take 1 tablet (500 mg total) by mouth 4 (four) times daily.      mirtazapine 15 MG tablet   Commonly known as: REMERON   Take 15 mg by mouth at bedtime.      oxyCODONE-acetaminophen 10-325 MG per tablet   Commonly known as: PERCOCET   Take 1 tablet by mouth every 4 (four) hours as needed for pain.      REQUIP XL 6 MG Tb24   Generic drug: Ropinirole HCl   Take 1-2 tablets by mouth every evening.      Rivaroxaban 15 MG Tabs tablet   Commonly known as: Alveda Reasons  Take 1 tablet (15 mg total) by mouth daily before breakfast.      rivaroxaban 10 MG Tabs tablet   Commonly known as: XARELTO   Take 10 mg by mouth every morning.         ASK your doctor about these medications         LIDODERM 5 %   Generic drug: lidocaine   Apply 1 patch topically Daily. Lower part of back  Pt removed patch at hs             Signed: Mikal Blasdell A 08/05/2012, 7:25 AM

## 2012-08-05 NOTE — Discharge Summary (Signed)
Physician Discharge Summary  Patient ID: [name] MRN: [id number] DOB/AGE: 07-27-1919 78 y.o.  Admit date: 08/04/2012 Discharge date: 08/05/2012  Admission Diagnoses:Complex Tear of Rotator Cuff Discharge Diagnoses:  Active Problems:  Complete rotator cuff rupture of left shoulder   Discharged Condition: good  Hospital Course: No complications  Consults: None  Significant Diagnostic Studies: labs only  Treatments: antibiotics: Clindomycin  Discharge Exam: Blood pressure 123/66, pulse 56, temperature 98.2 F (36.8 C), temperature source Oral, resp. rate 16, height 5' 2"  (1.575 m), weight 59.875 kg (132 lb), SpO2 100.00%. Extremities: extremities normal, atraumatic, no cyanosis or edema  Disposition: 01-Home or Self Care  Discharge Orders    Future Orders Please Complete By Expires   Call MD / Call 911      Comments:   If you experience chest pain or shortness of breath, CALL 911 and be transported to the hospital emergency room.  If you develope a fever above 101 F, pus (white drainage) or increased drainage or redness at the wound, or calf pain, call your surgeon's office.   Increase activity slowly as tolerated      Discharge instructions      Comments:   Keep your sling on at all times, including sleeping in your sling.  The only time you should remove your sling is to shower only but you need to keep your hand against your chest while you shower.   For the first few days, remove your dressing, tape a piece of saran wrap over your incision, take your shower, then remove the saran wrap and put a clean dressing on, then reapply your sling.  After two days you can shower without the saran wrap.   Call Dr. Gladstone Lighter if any wound complications or temperature of 101 degrees F or over.   Call the office for an appointment to see Dr. Gladstone Lighter in two weeks: [phone] and ask for Dr. Charlestine Night nurse, Brunilda Payor.   Driving restrictions      Comments:   No driving for  6 weeks     Medication List  As of 08/05/2012  7:20 AM   STOP taking these medications         oxyCODONE-acetaminophen 5-325 MG per tablet         TAKE these medications         DULoxetine 30 MG capsule   Commonly known as: CYMBALTA   Take 30 mg by mouth every morning.      ergocalciferol 50000 UNITS capsule   Commonly known as: VITAMIN D2   Take 50,000 Units by mouth 2 (two) times a week. Take twice weekly on Wednesday and Sunday      metFORMIN 500 MG tablet   Commonly known as: GLUCOPHAGE   Take 500 mg by mouth daily with breakfast.      methocarbamol 500 MG tablet   Commonly known as: ROBAXIN   Take 1 tablet (500 mg total) by mouth 4 (four) times daily.      mirtazapine 15 MG tablet   Commonly known as: REMERON   Take 15 mg by mouth at bedtime.      oxyCODONE-acetaminophen 10-325 MG per tablet   Commonly known as: PERCOCET   Take 1 tablet by mouth every 4 (four) hours as needed for pain.      REQUIP XL 6 MG Tb24   Generic drug: Ropinirole HCl   Take 1-2 tablets by mouth every evening.      Rivaroxaban 15 MG Tabs tablet  Commonly known as: XARELTO   Take 1 tablet (15 mg total) by mouth daily before breakfast.      rivaroxaban 10 MG Tabs tablet   Commonly known as: XARELTO   Take 10 mg by mouth every morning.         ASK your doctor about these medications         LIDODERM 5 %   Generic drug: lidocaine   Apply 1 patch topically Daily. Lower part of back  Pt removed patch at hs             Signed: Annalis Kaczmarczyk A 08/05/2012, 7:20 AM

## 2012-08-05 NOTE — Op Note (Signed)
NAMELADAVIA, [name] NO.:  [id number]  MEDICAL RECORD NO.:  [id number]  LOCATION:  1791                         FACILITY:  [facility]  PHYSICIAN:  Kipp Brood. Kandiss Ihrig, M.D.DATE OF BIRTH:  1919/03/17  DATE OF PROCEDURE: DATE OF DISCHARGE:                              OPERATIVE REPORT   PREOPERATIVE DIAGNOSES: 1. Complete complex tear with retraction of the left rotator cuff. 2. Severe impingement syndrome, left shoulder.  POSTOPERATIVE DIAGNOSES: 1. Complete complex tear with retraction of the left rotator cuff. 2. Severe impingement syndrome, left shoulder.  OPERATION: 1. Open acromionectomy, left shoulder. 2. Repair of a complex tear of the rotator cuff tendon, left shoulder. 3. TissueMend graft utilizing 2 anchors, left shoulder, open     acromionectomy. 4. Open repair of the rotator cuff tendon.  SURGEON:  Kipp Brood. Danaya Geddis, MD  ASSISTANT:  The OR tech.  She had Cleocin 900 mg IV preop.  PROCEDURE:  Under general anesthesia with the patient in the upright position in the sitting position on the beach chair, she had Cleocin 900 mg IV.  At this time, after sterile prep and drape, the appropriate time- out was carried out.  I also marked the appropriate left arm in the holding area.  At this time, incision was made over the anterior aspect, left shoulder.  Bleeders identified and cauterized.  I went down, identified the acromion, I stripped the deltoid tendon from the acromion in the usual fashion.  Note, she is 78 years old.  Her muscle is atrophied.  The soft tissues are __________ subcutaneous tissue.  I went down, split a small proximal portion of the deltoid muscle, and then went down and did an open synovectomy.  She had severe chronic bursitis. After doing that, I went down identified the rotator cuff, she had a complex tear of the cuff that was split longitudinally and then transversely and completely pulled off the humerus.  I went back  and secured the cuff, brought it forward.  I utilized the gloved finger to free up all the adhesions.  I then utilized some transverse sutures with Ethibond and then utilized 2 anchors in the proximal humerus and anchored the tendon down in place.  Prior to doing that though I did an open acromionectomy and acromioplasty in usual fashion.  Following that, I then applied a TissueMend graft over the repair site, sutured that down in place.  I then injected about 10 mL of Exparel into the wound site.  Great care was taken not to inject intravascular.  Following that, I went on and reapproximated the deltoid tendon and muscle in usual fashion.  Subcu was closed with 0 Vicryl, skin with metal staples.  Sterile Neosporin dressing was applied.  She was placed in a shoulder immobilizer.          ______________________________ Kipp Brood Gladstone Lighter, M.D.     RAG/MEDQ  D:  08/04/2012  T:  08/05/2012  Job:  [id number]

## 2012-08-09 LAB — GLUCOSE, CAPILLARY: Glucose-Capillary: 105 mg/dL — ABNORMAL HIGH (ref 70–99)

## 2012-08-23 DIAGNOSIS — G609 Hereditary and idiopathic neuropathy, unspecified: Secondary | ICD-10-CM | POA: Diagnosis not present

## 2012-08-23 DIAGNOSIS — F329 Major depressive disorder, single episode, unspecified: Secondary | ICD-10-CM | POA: Diagnosis not present

## 2012-08-23 DIAGNOSIS — F3289 Other specified depressive episodes: Secondary | ICD-10-CM | POA: Diagnosis not present

## 2012-08-23 DIAGNOSIS — I824Z9 Acute embolism and thrombosis of unspecified deep veins of unspecified distal lower extremity: Secondary | ICD-10-CM | POA: Diagnosis not present

## 2012-08-23 DIAGNOSIS — G2589 Other specified extrapyramidal and movement disorders: Secondary | ICD-10-CM | POA: Diagnosis not present

## 2012-08-23 DIAGNOSIS — Z23 Encounter for immunization: Secondary | ICD-10-CM | POA: Diagnosis not present

## 2012-08-23 DIAGNOSIS — E538 Deficiency of other specified B group vitamins: Secondary | ICD-10-CM | POA: Diagnosis not present

## 2012-08-23 DIAGNOSIS — I10 Essential (primary) hypertension: Secondary | ICD-10-CM | POA: Diagnosis not present

## 2012-08-23 DIAGNOSIS — E119 Type 2 diabetes mellitus without complications: Secondary | ICD-10-CM | POA: Diagnosis not present

## 2012-09-09 DIAGNOSIS — I10 Essential (primary) hypertension: Secondary | ICD-10-CM | POA: Diagnosis not present

## 2012-09-09 DIAGNOSIS — I824Z9 Acute embolism and thrombosis of unspecified deep veins of unspecified distal lower extremity: Secondary | ICD-10-CM | POA: Diagnosis not present

## 2012-09-09 DIAGNOSIS — E119 Type 2 diabetes mellitus without complications: Secondary | ICD-10-CM | POA: Diagnosis not present

## 2012-09-09 DIAGNOSIS — F3289 Other specified depressive episodes: Secondary | ICD-10-CM | POA: Diagnosis not present

## 2012-09-09 DIAGNOSIS — E538 Deficiency of other specified B group vitamins: Secondary | ICD-10-CM | POA: Diagnosis not present

## 2012-09-09 DIAGNOSIS — G2589 Other specified extrapyramidal and movement disorders: Secondary | ICD-10-CM | POA: Diagnosis not present

## 2012-09-09 DIAGNOSIS — G609 Hereditary and idiopathic neuropathy, unspecified: Secondary | ICD-10-CM | POA: Diagnosis not present

## 2012-09-09 DIAGNOSIS — F329 Major depressive disorder, single episode, unspecified: Secondary | ICD-10-CM | POA: Diagnosis not present

## 2012-09-13 DIAGNOSIS — M545 Low back pain, unspecified: Secondary | ICD-10-CM | POA: Diagnosis not present

## 2012-09-20 DIAGNOSIS — M79609 Pain in unspecified limb: Secondary | ICD-10-CM | POA: Diagnosis not present

## 2012-09-21 DIAGNOSIS — M48061 Spinal stenosis, lumbar region without neurogenic claudication: Secondary | ICD-10-CM | POA: Diagnosis not present

## 2012-09-21 DIAGNOSIS — M545 Low back pain, unspecified: Secondary | ICD-10-CM | POA: Diagnosis not present

## 2012-09-22 ENCOUNTER — Other Ambulatory Visit: Payer: Self-pay | Admitting: Neurosurgery

## 2012-09-22 DIAGNOSIS — M549 Dorsalgia, unspecified: Secondary | ICD-10-CM

## 2012-09-27 ENCOUNTER — Ambulatory Visit
Admission: RE | Admit: 2012-09-27 | Discharge: 2012-09-27 | Disposition: A | Discharge status: Home / Self Care | Payer: Medicare Other | Source: Ambulatory Visit | Attending: Neurosurgery | Admitting: Neurosurgery

## 2012-09-27 DIAGNOSIS — M549 Dorsalgia, unspecified: Secondary | ICD-10-CM

## 2012-09-27 DIAGNOSIS — M48061 Spinal stenosis, lumbar region without neurogenic claudication: Secondary | ICD-10-CM | POA: Diagnosis not present

## 2012-09-27 DIAGNOSIS — M4310 Spondylolisthesis, site unspecified: Secondary | ICD-10-CM | POA: Diagnosis not present

## 2012-09-27 MED ORDER — GADOBENATE DIMEGLUMINE 529 MG/ML IV SOLN
12.0000 mL | Freq: Once | INTRAVENOUS | Status: AC | PRN
  Administered 2012-09-27: 12 mL via INTRAVENOUS

## 2012-10-04 DIAGNOSIS — M545 Low back pain, unspecified: Secondary | ICD-10-CM | POA: Diagnosis not present

## 2012-10-12 DIAGNOSIS — E519 Thiamine deficiency, unspecified: Secondary | ICD-10-CM | POA: Diagnosis not present

## 2012-10-14 DIAGNOSIS — G609 Hereditary and idiopathic neuropathy, unspecified: Secondary | ICD-10-CM | POA: Diagnosis not present

## 2012-10-14 DIAGNOSIS — G894 Chronic pain syndrome: Secondary | ICD-10-CM | POA: Diagnosis not present

## 2012-10-14 DIAGNOSIS — E538 Deficiency of other specified B group vitamins: Secondary | ICD-10-CM | POA: Diagnosis not present

## 2012-10-14 DIAGNOSIS — F329 Major depressive disorder, single episode, unspecified: Secondary | ICD-10-CM | POA: Diagnosis not present

## 2012-10-14 DIAGNOSIS — Z23 Encounter for immunization: Secondary | ICD-10-CM | POA: Diagnosis not present

## 2012-10-14 DIAGNOSIS — F3289 Other specified depressive episodes: Secondary | ICD-10-CM | POA: Diagnosis not present

## 2012-10-14 DIAGNOSIS — I10 Essential (primary) hypertension: Secondary | ICD-10-CM | POA: Diagnosis not present

## 2012-10-14 DIAGNOSIS — E119 Type 2 diabetes mellitus without complications: Secondary | ICD-10-CM | POA: Diagnosis not present

## 2012-10-14 DIAGNOSIS — I824Z9 Acute embolism and thrombosis of unspecified deep veins of unspecified distal lower extremity: Secondary | ICD-10-CM | POA: Diagnosis not present

## 2012-10-15 ENCOUNTER — Encounter: Payer: Self-pay | Admitting: Physical Medicine & Rehabilitation

## 2012-10-15 DIAGNOSIS — M5137 Other intervertebral disc degeneration, lumbosacral region: Secondary | ICD-10-CM | POA: Diagnosis not present

## 2012-10-15 DIAGNOSIS — M545 Low back pain, unspecified: Secondary | ICD-10-CM | POA: Diagnosis not present

## 2012-10-15 DIAGNOSIS — Z981 Arthrodesis status: Secondary | ICD-10-CM | POA: Diagnosis not present

## 2012-11-01 DIAGNOSIS — R195 Other fecal abnormalities: Secondary | ICD-10-CM | POA: Diagnosis not present

## 2012-11-01 DIAGNOSIS — K5730 Diverticulosis of large intestine without perforation or abscess without bleeding: Secondary | ICD-10-CM | POA: Diagnosis not present

## 2012-11-01 DIAGNOSIS — K625 Hemorrhage of anus and rectum: Secondary | ICD-10-CM | POA: Diagnosis not present

## 2012-11-08 DIAGNOSIS — M25559 Pain in unspecified hip: Secondary | ICD-10-CM | POA: Diagnosis not present

## 2012-11-10 ENCOUNTER — Encounter: Payer: Medicare Other | Admitting: Physical Medicine & Rehabilitation

## 2012-11-18 DIAGNOSIS — M545 Low back pain, unspecified: Secondary | ICD-10-CM | POA: Diagnosis not present

## 2012-12-05 ENCOUNTER — Emergency Department (HOSPITAL_COMMUNITY): Payer: Medicare Other

## 2012-12-05 ENCOUNTER — Inpatient Hospital Stay (HOSPITAL_COMMUNITY)
Admission: EM | Admit: 2012-12-05 | Discharge: 2012-12-09 | DRG: 184 | Disposition: A | Discharge status: Skilled Nursing Facility | Payer: Medicare Other | Attending: General Surgery | Admitting: General Surgery

## 2012-12-05 ENCOUNTER — Encounter (HOSPITAL_COMMUNITY): Payer: Self-pay | Admitting: *Deleted

## 2012-12-05 DIAGNOSIS — Y92009 Unspecified place in unspecified non-institutional (private) residence as the place of occurrence of the external cause: Secondary | ICD-10-CM

## 2012-12-05 DIAGNOSIS — W07XXXA Fall from chair, initial encounter: Secondary | ICD-10-CM | POA: Diagnosis present

## 2012-12-05 DIAGNOSIS — A498 Other bacterial infections of unspecified site: Secondary | ICD-10-CM | POA: Diagnosis present

## 2012-12-05 DIAGNOSIS — S2249XA Multiple fractures of ribs, unspecified side, initial encounter for closed fracture: Secondary | ICD-10-CM

## 2012-12-05 DIAGNOSIS — M25559 Pain in unspecified hip: Secondary | ICD-10-CM | POA: Diagnosis present

## 2012-12-05 DIAGNOSIS — I82409 Acute embolism and thrombosis of unspecified deep veins of unspecified lower extremity: Secondary | ICD-10-CM

## 2012-12-05 DIAGNOSIS — Z86718 Personal history of other venous thrombosis and embolism: Secondary | ICD-10-CM

## 2012-12-05 DIAGNOSIS — E119 Type 2 diabetes mellitus without complications: Secondary | ICD-10-CM | POA: Diagnosis present

## 2012-12-05 DIAGNOSIS — M353 Polymyalgia rheumatica: Secondary | ICD-10-CM | POA: Diagnosis present

## 2012-12-05 DIAGNOSIS — M549 Dorsalgia, unspecified: Secondary | ICD-10-CM | POA: Diagnosis present

## 2012-12-05 DIAGNOSIS — R269 Unspecified abnormalities of gait and mobility: Secondary | ICD-10-CM | POA: Diagnosis not present

## 2012-12-05 DIAGNOSIS — G8929 Other chronic pain: Secondary | ICD-10-CM | POA: Diagnosis present

## 2012-12-05 DIAGNOSIS — R079 Chest pain, unspecified: Secondary | ICD-10-CM | POA: Diagnosis not present

## 2012-12-05 DIAGNOSIS — K222 Esophageal obstruction: Secondary | ICD-10-CM | POA: Diagnosis not present

## 2012-12-05 DIAGNOSIS — J438 Other emphysema: Secondary | ICD-10-CM | POA: Diagnosis not present

## 2012-12-05 DIAGNOSIS — H9190 Unspecified hearing loss, unspecified ear: Secondary | ICD-10-CM | POA: Diagnosis present

## 2012-12-05 DIAGNOSIS — S2242XA Multiple fractures of ribs, left side, initial encounter for closed fracture: Secondary | ICD-10-CM | POA: Diagnosis present

## 2012-12-05 DIAGNOSIS — IMO0001 Reserved for inherently not codable concepts without codable children: Secondary | ICD-10-CM | POA: Diagnosis not present

## 2012-12-05 DIAGNOSIS — R071 Chest pain on breathing: Secondary | ICD-10-CM | POA: Diagnosis not present

## 2012-12-05 DIAGNOSIS — N318 Other neuromuscular dysfunction of bladder: Secondary | ICD-10-CM | POA: Diagnosis present

## 2012-12-05 DIAGNOSIS — M7989 Other specified soft tissue disorders: Secondary | ICD-10-CM

## 2012-12-05 DIAGNOSIS — M542 Cervicalgia: Secondary | ICD-10-CM | POA: Diagnosis not present

## 2012-12-05 DIAGNOSIS — K5090 Crohn's disease, unspecified, without complications: Secondary | ICD-10-CM | POA: Diagnosis not present

## 2012-12-05 DIAGNOSIS — E785 Hyperlipidemia, unspecified: Secondary | ICD-10-CM | POA: Diagnosis present

## 2012-12-05 DIAGNOSIS — N390 Urinary tract infection, site not specified: Secondary | ICD-10-CM | POA: Diagnosis not present

## 2012-12-05 DIAGNOSIS — T148XXA Other injury of unspecified body region, initial encounter: Secondary | ICD-10-CM | POA: Diagnosis not present

## 2012-12-05 DIAGNOSIS — Z66 Do not resuscitate: Secondary | ICD-10-CM | POA: Diagnosis not present

## 2012-12-05 DIAGNOSIS — G4733 Obstructive sleep apnea (adult) (pediatric): Secondary | ICD-10-CM | POA: Diagnosis present

## 2012-12-05 DIAGNOSIS — I10 Essential (primary) hypertension: Secondary | ICD-10-CM | POA: Diagnosis present

## 2012-12-05 DIAGNOSIS — R55 Syncope and collapse: Secondary | ICD-10-CM | POA: Diagnosis not present

## 2012-12-05 DIAGNOSIS — M6281 Muscle weakness (generalized): Secondary | ICD-10-CM | POA: Diagnosis not present

## 2012-12-05 DIAGNOSIS — G2581 Restless legs syndrome: Secondary | ICD-10-CM | POA: Diagnosis present

## 2012-12-05 DIAGNOSIS — Z9181 History of falling: Secondary | ICD-10-CM | POA: Diagnosis not present

## 2012-12-05 DIAGNOSIS — W19XXXA Unspecified fall, initial encounter: Secondary | ICD-10-CM

## 2012-12-05 LAB — COMPREHENSIVE METABOLIC PANEL
BUN: 23 mg/dL (ref 6–23)
CO2: 28 mEq/L (ref 19–32)
Chloride: 105 mEq/L (ref 96–112)
Creatinine, Ser: 0.85 mg/dL (ref 0.50–1.10)
GFR calc Af Amer: 67 mL/min — ABNORMAL LOW (ref >90)
GFR calc non Af Amer: 58 mL/min — ABNORMAL LOW (ref >90)
Glucose, Bld: 151 mg/dL — ABNORMAL HIGH (ref 70–99)
Total Bilirubin: 0.2 mg/dL — ABNORMAL LOW (ref 0.3–1.2)

## 2012-12-05 LAB — CBC
HCT: 30.2 % — ABNORMAL LOW (ref 36.0–46.0)
MCV: 90.1 fL (ref 78.0–100.0)
RBC: 3.35 MIL/uL — ABNORMAL LOW (ref 3.87–5.11)
WBC: 6 10*3/uL (ref 4.0–10.5)

## 2012-12-05 LAB — MRSA PCR SCREENING: MRSA by PCR: NEGATIVE

## 2012-12-05 MED ORDER — MORPHINE SULFATE 4 MG/ML IJ SOLN
4.0000 mg | Freq: Once | INTRAMUSCULAR | Status: AC
  Administered 2012-12-05: 4 mg via INTRAVENOUS
  Filled 2012-12-05: qty 1

## 2012-12-05 MED ORDER — ENOXAPARIN SODIUM 30 MG/0.3ML ~~LOC~~ SOLN
30.0000 mg | SUBCUTANEOUS | Status: DC
  Administered 2012-12-06 – 2012-12-08 (×3): 30 mg via SUBCUTANEOUS
  Filled 2012-12-05 (×5): qty 0.3

## 2012-12-05 MED ORDER — L-METHYLFOLATE-B6-B12 3-35-2 MG PO TABS
1.0000 | ORAL_TABLET | Freq: Two times a day (BID) | ORAL | Status: DC
  Administered 2012-12-05 – 2012-12-09 (×8): 1 via ORAL
  Filled 2012-12-05 (×10): qty 1

## 2012-12-05 MED ORDER — LIDOCAINE 5 % EX PTCH
2.0000 | MEDICATED_PATCH | CUTANEOUS | Status: DC
  Administered 2012-12-06 – 2012-12-08 (×3): 2 via TRANSDERMAL
  Filled 2012-12-05 (×5): qty 2

## 2012-12-05 MED ORDER — ONDANSETRON HCL 4 MG/2ML IJ SOLN
4.0000 mg | Freq: Once | INTRAMUSCULAR | Status: AC
  Administered 2012-12-05: 4 mg via INTRAVENOUS
  Filled 2012-12-05: qty 2

## 2012-12-05 MED ORDER — OXYCODONE HCL 5 MG PO TABS
5.0000 mg | ORAL_TABLET | ORAL | Status: DC | PRN
  Administered 2012-12-06 – 2012-12-09 (×10): 10 mg via ORAL
  Filled 2012-12-05 (×10): qty 2

## 2012-12-05 MED ORDER — SENNOSIDES-DOCUSATE SODIUM 8.6-50 MG PO TABS
1.0000 | ORAL_TABLET | Freq: Every evening | ORAL | Status: DC | PRN
  Filled 2012-12-05: qty 1

## 2012-12-05 MED ORDER — ACETAMINOPHEN 10 MG/ML IV SOLN
1000.0000 mg | Freq: Four times a day (QID) | INTRAVENOUS | Status: AC
  Administered 2012-12-06 (×3): 1000 mg via INTRAVENOUS
  Filled 2012-12-05 (×4): qty 100

## 2012-12-05 MED ORDER — PANTOPRAZOLE SODIUM 40 MG IV SOLR
40.0000 mg | Freq: Every day | INTRAVENOUS | Status: DC
  Administered 2012-12-05: 40 mg via INTRAVENOUS
  Filled 2012-12-05 (×2): qty 40

## 2012-12-05 MED ORDER — DOCUSATE SODIUM 100 MG PO CAPS
100.0000 mg | ORAL_CAPSULE | Freq: Every day | ORAL | Status: DC
  Administered 2012-12-06 – 2012-12-09 (×4): 100 mg via ORAL
  Filled 2012-12-05 (×5): qty 1

## 2012-12-05 MED ORDER — ACETAMINOPHEN 650 MG RE SUPP: 650.0000 mg | Freq: Four times a day (QID) | RECTAL | Status: DC | PRN

## 2012-12-05 MED ORDER — HYDROMORPHONE HCL PF 1 MG/ML IJ SOLN
0.5000 mg | INTRAMUSCULAR | Status: DC | PRN
  Administered 2012-12-05: 0.5 mg via INTRAVENOUS
  Filled 2012-12-05: qty 1

## 2012-12-05 MED ORDER — SODIUM CHLORIDE 0.9 % IV SOLN
INTRAVENOUS | Status: DC
  Administered 2012-12-05: 14:00:00 via INTRAVENOUS

## 2012-12-05 MED ORDER — ROPINIROLE HCL 1 MG PO TABS
2.0000 mg | ORAL_TABLET | Freq: Three times a day (TID) | ORAL | Status: DC
  Administered 2012-12-05 – 2012-12-09 (×10): 2 mg via ORAL
  Filled 2012-12-05 (×15): qty 2

## 2012-12-05 MED ORDER — GABAPENTIN 300 MG PO CAPS
300.0000 mg | ORAL_CAPSULE | Freq: Two times a day (BID) | ORAL | Status: DC
  Administered 2012-12-05 – 2012-12-09 (×8): 300 mg via ORAL
  Filled 2012-12-05 (×9): qty 1

## 2012-12-05 MED ORDER — ONDANSETRON HCL 4 MG/2ML IJ SOLN: 4.0000 mg | Freq: Four times a day (QID) | INTRAMUSCULAR | Status: DC | PRN

## 2012-12-05 MED ORDER — HYDROCODONE-ACETAMINOPHEN 5-325 MG PO TABS
1.0000 | ORAL_TABLET | Freq: Once | ORAL | Status: AC
  Administered 2012-12-05: 1 via ORAL
  Filled 2012-12-05: qty 1

## 2012-12-05 MED ORDER — DIPHENHYDRAMINE HCL 50 MG/ML IJ SOLN: 12.5000 mg | Freq: Four times a day (QID) | INTRAMUSCULAR | Status: DC | PRN

## 2012-12-05 MED ORDER — MIRTAZAPINE 15 MG PO TABS
15.0000 mg | ORAL_TABLET | Freq: Every day | ORAL | Status: DC
  Administered 2012-12-05 – 2012-12-08 (×4): 15 mg via ORAL
  Filled 2012-12-05 (×5): qty 1

## 2012-12-05 MED ORDER — ACETAMINOPHEN 325 MG PO TABS
650.0000 mg | ORAL_TABLET | Freq: Four times a day (QID) | ORAL | Status: DC | PRN
  Administered 2012-12-07 – 2012-12-09 (×2): 650 mg via ORAL
  Filled 2012-12-05 (×2): qty 2

## 2012-12-05 MED ORDER — DIPHENHYDRAMINE HCL 12.5 MG/5ML PO ELIX
12.5000 mg | ORAL_SOLUTION | Freq: Four times a day (QID) | ORAL | Status: DC | PRN
  Filled 2012-12-05: qty 5

## 2012-12-05 MED ORDER — SODIUM CHLORIDE 0.9 % IV SOLN
INTRAVENOUS | Status: DC
  Administered 2012-12-06: 03:00:00 via INTRAVENOUS

## 2012-12-05 MED ORDER — ENOXAPARIN SODIUM 60 MG/0.6ML ~~LOC~~ SOLN
1.0000 mg/kg | Freq: Two times a day (BID) | SUBCUTANEOUS | Status: DC
  Filled 2012-12-05 (×2): qty 0.6

## 2012-12-05 NOTE — H&P (Signed)
[name] is an 78 y.o. female.   Chief Complaint: Fall with fx of the 5th, 6th, and 7th left ribs with mild displacement. Neurosurgeon:  Cabell Primary care: Dr. Benito Mccreedy HPI: Patient is a 78 year old female who has problems with chronic pain in her back and had a recent epidural. She is scheduled to see Dr. Maryjean Ka in the pain clinic tomorrow for right hip pain which has become progressively worse. She lives alone and currently she can get around some of her home she uses a walker and has a progressive increase in inability to ambulate because of  her right hip pain. She has lidocaine patches at 2 places on her right hip. Because of her chronic pain she takes Percocet.  Today she had a Percocet prior to breakfast and fell asleep at the table after eating. When she tried to get up from the breakfast table she fell. She denies loss of consciousness she developed extreme pain and discomfort with any movement and was brought to the emergency room by her family. Workup in the emergency room shows her labs were essentially normal with some mild anemia, low albumin and total protein. Mild decline in GFR and glucose elevation. CT of the head was unremarkable.  Chest x-ray three-view  &  rib series shows no pleural effusion or edema and no airspace consolidation; hiatal hernia and mildly displaced left fifth sixth and seventh rib fractures. We will see in consultation and admitted for observation and further treatment as indicated. Pt has a living Will, but was uncertain about this.  She ask my opinion, and I have ask her to talk with family before we make any documentation.  Past Medical History  Diagnosis Date  . Cyst and pseudocyst of pancreas   . Unspecified essential hypertension   . Other and unspecified hyperlipidemia   . Polymyalgia rheumatica/worsening hip pain on right Chronic back pain with recent epidural Rx.    Hx of GERD and Hiatal Hernia/HX OF Celiac stenosis 2007   . Restless  legs syndrome (RLS) on Requip/OSA   . Insomnia, unspecified   . Other malaise and fatigue   . Diverticulosis of colon (without mention of hemorrhage)   . Acute gastritis without mention of hemorrhage   . Duodenitis without mention of hemorrhage/ pt does not remember this   . Diaphragmatic hernia without mention of obstruction or gangrene   . Esophageal reflux   . Stricture and stenosis of esophagus/ Dilatation about 2 years agol   . OAB (overactive bladder)   . Left leg DVT on Xarelto ( she was off it currently 11/28/12,) with plans for visit with pain clinic tomorrow 12/06/12. jan 2013  . Unspecified sleep apnea     hx of sleep apnea, none since weight loss  . Diabetes mellitus non insulin dependant Progressive trouble walking     Past Surgical History  Procedure Date  . Oophorectomy 1944    not sure which ovary  . Rotator cuff repair right     4-5 yrs ago  . Eye surgery     cataracts removed-bilateral eyes  . Back surgery 2009    lower back  . Colon surgery June 17, 2012    surgery for bleed  . Shoulder open rotator cuff repair 08/04/2012    Procedure: ROTATOR CUFF REPAIR SHOULDER OPEN;  Surgeon: Tobi Bastos, MD;  Location: [facility];  Service: Orthopedics;  Laterality: Left;  with Anchors and Graft    Family History  Problem Relation Age  of Onset  . Melanoma Daughter     died age 26  . Melanoma Son    Social History:  reports that she has never smoked. She has never used smokeless tobacco. She reports that she does not drink alcohol or use illicit drugs.  Allergies:  Allergies  Allergen Reactions  . Sulfa Antibiotics Nausea And Vomiting  . Penicillins Hives and Rash   Prior to Admission medications   Medication Sig Start Date End Date Taking? Authorizing Provider  ergocalciferol (VITAMIN D2) 50000 UNITS capsule Take 50,000 Units by mouth 2 (two) times a week. Take twice weekly on Wednesday and Sunday   Yes Historical Provider, MD  gabapentin (NEURONTIN) 100 MG  capsule Take 100 mg by mouth at bedtime.   Yes Historical Provider, MD  gabapentin (NEURONTIN) 300 MG capsule Take 300 mg by mouth 2 (two) times daily.   Yes Historical Provider, MD  l-methylfolate-B6-B12 (METANX) 3-35-2 MG TABS Take 1 tablet by mouth 2 (two) times daily.   Yes Historical Provider, MD  metFORMIN (GLUCOPHAGE) 500 MG tablet Take 250 mg by mouth 2 (two) times daily with a meal.    Yes Historical Provider, MD  mirtazapine (REMERON) 15 MG tablet Take 15 mg by mouth at bedtime.   Yes Historical Provider, MD  oxyCODONE-acetaminophen (PERCOCET/ROXICET) 5-325 MG per tablet Take 1 tablet by mouth 2 (two) times daily as needed. For pain   Yes Historical Provider, MD  Ropinirole HCl (REQUIP XL) 6 MG TB24 Take 1 tablet by mouth every evening.   Yes Historical Provider, MD  She is also on xarleto and lidocaine patches, not listed above.  (Not in a hospital admission)  Results for orders placed during the hospital encounter of 12/05/12 (from the past 48 hour(s))  PROTIME-INR     Status: Normal   Collection Time   12/05/12 12:28 PM      Component Value Range Comment   Prothrombin Time 12.9  11.6 - 15.2 seconds    INR 0.98  0.00 - 1.49   CBC     Status: Abnormal   Collection Time   12/05/12 12:28 PM      Component Value Range Comment   WBC 6.0  4.0 - 10.5 K/uL    RBC 3.35 (*) 3.87 - 5.11 MIL/uL    Hemoglobin 9.4 (*) 12.0 - 15.0 g/dL    HCT 30.2 (*) 36.0 - 46.0 %    MCV 90.1  78.0 - 100.0 fL    MCH 28.1  26.0 - 34.0 pg    MCHC 31.1  30.0 - 36.0 g/dL    RDW 15.6 (*) 11.5 - 15.5 %    Platelets 255  150 - 400 K/uL   COMPREHENSIVE METABOLIC PANEL     Status: Abnormal   Collection Time   12/05/12 12:28 PM      Component Value Range Comment   Sodium 141  135 - 145 mEq/L    Potassium 4.4  3.5 - 5.1 mEq/L    Chloride 105  96 - 112 mEq/L    CO2 28  19 - 32 mEq/L    Glucose, Bld 151 (*) 70 - 99 mg/dL    BUN 23  6 - 23 mg/dL    Creatinine, Ser 0.85  0.50 - 1.10 mg/dL    Calcium 9.3  8.4  - 10.5 mg/dL    Total Protein 5.9 (*) 6.0 - 8.3 g/dL    Albumin 2.9 (*) 3.5 - 5.2 g/dL    AST 26  0 - 37 U/L    ALT 18  0 - 35 U/L    Alkaline Phosphatase 106  39 - 117 U/L    Total Bilirubin 0.2 (*) 0.3 - 1.2 mg/dL    GFR calc non Af Amer 58 (*) >90 mL/min    GFR calc Af Amer 67 (*) >90 mL/min    Dg Ribs Unilateral W/chest Left  12/05/2012  *RADIOLOGY REPORT*  Clinical Data: Fall.  Pain  LEFT RIBS AND CHEST - 3+ VIEW  Comparison: 12 02/03/2012  Findings: Normal heart size.  No pleural effusion or edema.  No airspace consolidation.  Hiatal hernia is again noted.  Dedicated views of the left ribs show mildly displaced fifth, sixth and seventh rib fractures.  IMPRESSION:  1.  Left rib fractures. 2.  No acute cardiopulmonary abnormalities.   Original Report Authenticated By: Kerby Moors, M.D.    Ct Head Wo Contrast  12/05/2012  *RADIOLOGY REPORT*  Clinical Data: syncope  CT HEAD WITHOUT CONTRAST  Technique:  Contiguous axial images were obtained from the base of the skull through the vertex without contrast.  Comparison: 06/26/12  Findings: There is diffuse patchy low density throughout the subcortical and periventricular white matter consistent with chronic small vessel ischemic change.  There is prominence of the sulci and ventricles consistent with brain atrophy.  There is no evidence for acute brain infarct, hemorrhage or mass.  The paranasal sinuses and mastoid air cells are clear.  The the skull is intact.  IMPRESSION:  1.  No acute intracranial abnormalities.   Original Report Authenticated By: Kerby Moors, M.D.     Review of Systems  Constitutional: Negative.   HENT: Positive for neck pain.   Eyes: Negative.   Respiratory: Negative.   Cardiovascular: Positive for leg swelling (left leg swollen since her DVT).  Gastrointestinal: Positive for heartburn (In past none now.l), nausea and vomiting. Negative for abdominal pain, diarrhea, constipation, blood in stool and melena.       Hx of  esophageal stricture with dilatation, last time about 2 years ago. She reports some reflux/nausea and vomiting waking her at night recently, she thought it was her percocet so she stopped taking it HS and symptoms are better.  Genitourinary: Negative.        She does wear a depends garmet because she can't get to the BR quick enough.  Musculoskeletal: Positive for back pain and falls.       She has chronic back pain and recently had an epidural injection which has helped. She has an appointment with pain clinic tomorrow for her right hip.  She is on percocet for this, and she thinks she fell asleep and fell after percocet.  Skin: Negative.   Neurological: Negative.   Endo/Heme/Allergies: Bruises/bleeds easily (she is normally on Xarelto for DVT has been off since 11/28/12 for visit with pain clinic tomorrow.).  Psychiatric/Behavioral: Negative.     Blood pressure 127/67, pulse 78, temperature 97.8 F (36.6 C), temperature source Oral, resp. rate 17, SpO2 95.00%. Physical Exam  Constitutional: She is oriented to person, place, and time. She appears well-developed and well-nourished. She appears distressed (she has acute pain with any movement.).  HENT:  Head: Normocephalic.  Nose: Nose normal.  Eyes: Conjunctivae normal and EOM are normal. Pupils are equal, round, and reactive to light. Right eye exhibits no discharge. Left eye exhibits no discharge. No scleral icterus.  Neck: Normal range of motion. Neck supple. No JVD present. No tracheal deviation present. No thyromegaly  present.  Cardiovascular: Normal rate, regular rhythm, normal heart sounds and intact distal pulses.  Exam reveals no gallop.   No murmur heard. Respiratory: Effort normal and breath sounds normal. No stridor. No respiratory distress. She has no wheezes. She has no rales. She exhibits tenderness.       She has extreme pain with any movement of her body right now.  GI: Soft. Bowel sounds are normal. She exhibits no  distension and no mass. There is no tenderness. There is no rebound and no guarding.  Musculoskeletal: She exhibits edema and tenderness.       LLE is larger and she is tender everywhere on exam  Lymphadenopathy:    She has no cervical adenopathy.  Neurological: She is alert and oriented to person, place, and time. No cranial nerve deficit.  Skin: Skin is warm and dry. No rash noted. No erythema. No pallor.  Psychiatric: She has a normal mood and affect. Her behavior is normal. Judgment and thought content normal.     Assessment/Plan 1. Fall with mildly displaced fractures of the left fifth sixth and seventh rib. 2. Chronic back pain with recent epidural injection therapy. 3. Chronic right hip pain, progressive in nature with limited mobility. She currently needs a walker for ambulation and is scheduled to be seen in the pain clinic tomorrow. 4. DVT with Xeralto therapy, off for the last 6 day for planned pain clinic evaluation. 5. AODM non-insulin-dependent 6. History of GERD/hiatal hernia/esophageal strictures with balloon dilatation 2 years ago. 7. Obstructive sleep apnea/restless leg syndrome, on Requip 8. Polymyalgia rheumatica 9. Overactive bladder  Plan: Patient has a good deal of pain and discomfort, and was having difficulty with ambulation prior to falling today. She now has 3 fractured ribs on the left in addition to her chronic back pain and increasing right hip pain. We plan to admit to the step down care unit for telemetry monitoring, significant pulmonary toilet and monitoring. Will try and control her pain with narcotics and IV Tylenol. Her left leg is still swollen and really tender, I will repeat lower extremity Dopplers on her today or in the a.m. on as far as to see and calculate dose for Lovenox DVT prophylaxis. Further evaluation and treatment as needed.  Discussed DNR status with her daughter; Mrs. Gassert and her daughter both agree to DNR status.  I have added DNR ORDER  to current list.  I have told the family and patient she can get a DNR order form for home if she desires that.   Quashaun Lazalde 12/05/2012, 2:51 PM

## 2012-12-05 NOTE — Progress Notes (Addendum)
ANTICOAGULATION CONSULT NOTE - Initial Consult  Pharmacy Consult for Lovenox Indication: VTE Treatment  Allergies  Allergen Reactions  . Sulfa Antibiotics Nausea And Vomiting  . Penicillins Hives and Rash    Patient Measurements: Height: 5' 1.81" (157 cm) Weight: 132 lb 0.9 oz (59.9 kg) IBW/kg (Calculated) : 49.67   Vital Signs: Temp: 97.8 F (36.6 C) (12/29 1025) Temp src: Oral (12/29 1025) BP: 127/67 mmHg (12/29 1400) Pulse Rate: 78  (12/29 1400)  Labs:  Basename 12/05/12 1228  HGB 9.4*  HCT 30.2*  PLT 255  APTT --  LABPROT 12.9  INR 0.98  HEPARINUNFRC --  CREATININE 0.85  CKTOTAL --  CKMB --  TROPONINI --    Estimated Creatinine Clearance: 36.6 ml/min (by C-G formula based on Cr of 0.85).   Medical History: Past Medical History  Diagnosis Date  . Cyst and pseudocyst of pancreas   . Unspecified essential hypertension   . Other and unspecified hyperlipidemia   . Polymyalgia rheumatica   . Personal history of unspecified digestive disease   . Restless legs syndrome (RLS)   . Insomnia, unspecified   . Other malaise and fatigue   . Diverticulosis of colon (without mention of hemorrhage)   . Acute gastritis without mention of hemorrhage   . Duodenitis without mention of hemorrhage   . Diaphragmatic hernia without mention of obstruction or gangrene   . Esophageal reflux   . Stricture and stenosis of esophagus   . OAB (overactive bladder)   . Left leg DVT jan 2013  . Unspecified sleep apnea     hx of sleep apnea, none since weight loss  . Diabetes mellitus     Medications:  No anticoagulation PTA- Xarelto in past, not currently taking   Assessment: [name] is a 78 year old female with a history of left leg DVT who presented to the hospital after falling from her chair. She is to be started on full dose lovenox for VTE treatment. No bleeding noted on CT and Xray shows left rib fractures.   Her cbc is consistent with anemia and platelets are with  normal limits at 255. (Baseline hgb 10-11 and plts 250s). Baseline INR 0.98.   Estimated weight of 60kg. Scr is at baseline 0.85. Estimated crcl is 2ml/min. This is borderline for QD versus BID dosing.   Goal of Therapy:  Anti-Xa level 0.6-1.2 units/ml 4hrs after LMWH dose given Monitor platelets by anticoagulation protocol: Yes   Plan:  Begin lovenox 1mg /kg (~60mg ) SQ BID q3d cbc while on lovenox F/u renal function and change in weight   Thank you,  Francesca Jewett, PharmD, BCPS 12/05/2012 3:10 PM   Addendum: Damaris Schooner with Modena Jansky, PA and clarified plan for DVT prophylaxis dosing.  Pt has a hx of DVT in Jan 2013 and was on Xarelto.  With recent fall and rib fracture will only start DVT prophylaxis dose Lovenox at this time.  Will use 30mg  SQ Q24 hours based on patient age, weight, and renal function.  [employer], Pharm.D., BCPS Clinical Pharmacist Pager [phone] 12/05/2012 4:00 PM

## 2012-12-05 NOTE — Progress Notes (Signed)
VASCULAR LAB PRELIMINARY  PRELIMINARY  PRELIMINARY  PRELIMINARY  Bilateral lower extremity venous Dopplers completed.    Preliminary report:  There is no DVT or SVT noted in the bilateral lower extremities.  Prior left lower extremity DVT appears resolved.  Tomeeka Plaugher, RVT 12/05/2012, 3:58 PM

## 2012-12-05 NOTE — ED Notes (Addendum)
[place] [address] out of chair onto carpet after falling asleep. C/o left rib pain, denies LOC.

## 2012-12-05 NOTE — ED Notes (Signed)
Patient transported to X-ray / CT scan. 

## 2012-12-05 NOTE — ED Notes (Signed)
Iv nurse paged

## 2012-12-05 NOTE — ED Notes (Signed)
Fell from chair, c/o left lower rib pain. Denies other injuries. No deformity, redness, bruising noted. States increase pain with mvmt & deep breaths. Resp e/u, no distress

## 2012-12-05 NOTE — H&P (Signed)
I have seen and examined the patient and agree with the assessment and plans.  Maire Govan A. Ninfa Linden  MD, FACS

## 2012-12-05 NOTE — ED Notes (Signed)
MD at bedside. 

## 2012-12-05 NOTE — ED Provider Notes (Signed)
History     CSN: [id number]  Arrival date & time 12/05/12  1014   First MD Initiated Contact with Patient 12/05/12 1018      Chief Complaint  Patient presents with  . Fall  . Rib Injury    (Consider location/radiation/quality/duration/timing/severity/associated sxs/prior treatment) Patient is a 78 y.o. female presenting with fall. The history is provided by the patient and the EMS personnel.  Fall Pertinent negatives include no fever, no abdominal pain and no headaches.  s/p fall at home this morning. Pt was sitting in chair, states fell asleep in chair falling out of chair onto left side. C/o dull, constant, severe pain to left lateral mid to lower chest. Non radiating. Worse w movement and palpation. Denies head injury. No headache. No neck or back pain. Denies other pain or injury. No sob. Pt is on blood thinner, xarelto, hx dvt.     Past Medical History  Diagnosis Date  . Cyst and pseudocyst of pancreas   . Unspecified essential hypertension   . Other and unspecified hyperlipidemia   . Polymyalgia rheumatica   . Personal history of unspecified digestive disease   . Restless legs syndrome (RLS)   . Insomnia, unspecified   . Other malaise and fatigue   . Diverticulosis of colon (without mention of hemorrhage)   . Acute gastritis without mention of hemorrhage   . Duodenitis without mention of hemorrhage   . Diaphragmatic hernia without mention of obstruction or gangrene   . Esophageal reflux   . Stricture and stenosis of esophagus   . OAB (overactive bladder)   . Left leg DVT jan 2013  . Unspecified sleep apnea     hx of sleep apnea, none since weight loss  . Diabetes mellitus     Past Surgical History  Procedure Date  . Oophorectomy 1944    not sure which ovary  . Rotator cuff repair right     4-5 yrs ago  . Eye surgery     cataracts removed-bilateral eyes  . Back surgery 2009    lower back  . Colon surgery June 17, 2012    surgery for bleed  . Shoulder  open rotator cuff repair 08/04/2012    Procedure: ROTATOR CUFF REPAIR SHOULDER OPEN;  Surgeon: Tobi Bastos, MD;  Location: [facility];  Service: Orthopedics;  Laterality: Left;  with Anchors and Graft    Family History  Problem Relation Age of Onset  . Melanoma Daughter     died age 13  . Melanoma Son     History  Substance Use Topics  . Smoking status: Never Smoker   . Smokeless tobacco: Never Used  . Alcohol Use: No    OB History    Grav Para Term Preterm Abortions TAB SAB Ect Mult Living                  Review of Systems  Constitutional: Negative for fever and chills.  HENT: Negative for neck pain.   Eyes: Negative for visual disturbance.  Respiratory: Negative for shortness of breath.   Cardiovascular: Positive for chest pain.  Gastrointestinal: Negative for abdominal pain.  Genitourinary: Negative for flank pain.  Musculoskeletal: Negative for back pain.  Skin: Negative for rash.  Neurological: Negative for headaches.  Hematological: Does not bruise/bleed easily.  Psychiatric/Behavioral: Negative for confusion.    Allergies  Sulfa antibiotics and Penicillins  Home Medications   Current Outpatient Rx  Name  Route  Sig  Dispense  Refill  .  DULOXETINE HCL 30 MG PO CPEP   Oral   Take 30 mg by mouth every morning.          Marland Kitchen ERGOCALCIFEROL 50000 UNITS PO CAPS   Oral   Take 50,000 Units by mouth 2 (two) times a week. Take twice weekly on Wednesday and Sunday         . METFORMIN HCL 500 MG PO TABS   Oral   Take 500 mg by mouth daily with breakfast.         . MIRTAZAPINE 15 MG PO TABS   Oral   Take 15 mg by mouth at bedtime.         Marland Kitchen RIVAROXABAN 10 MG PO TABS   Oral   Take 10 mg by mouth every morning.         Marland Kitchen RIVAROXABAN 15 MG PO TABS   Oral   Take 1 tablet (15 mg total) by mouth daily before breakfast.   60 tablet   0   . ROPINIROLE HCL ER 6 MG PO TB24   Oral   Take 1-2 tablets by mouth every evening.            BP 147/62   Temp 97.8 F (36.6 C) (Oral)  Resp 14  SpO2 97%  Physical Exam  Nursing note and vitals reviewed. Constitutional: She is oriented to person, place, and time. She appears well-developed and well-nourished. No distress.  HENT:  Head: Atraumatic.  Eyes: Conjunctivae normal are normal. Pupils are equal, round, and reactive to light. No scleral icterus.  Neck: Neck supple. No tracheal deviation present.  Cardiovascular: Normal rate, regular rhythm, normal heart sounds and intact distal pulses.   Pulmonary/Chest: Effort normal and breath sounds normal. No respiratory distress. She exhibits tenderness.       Marked left chest wall tenderness  Abdominal: Soft. Normal appearance. She exhibits no distension. There is no tenderness.  Musculoskeletal: Normal range of motion. She exhibits no edema and no tenderness.       CTLS spine, non tender, aligned, no step off.   Neurological: She is alert and oriented to person, place, and time.  Skin: Skin is warm and dry. No rash noted.  Psychiatric: She has a normal mood and affect.    ED Course  Procedures (including critical care time)  Dg Ribs Unilateral W/chest Left  12/05/2012  *RADIOLOGY REPORT*  Clinical Data: Fall.  Pain  LEFT RIBS AND CHEST - 3+ VIEW  Comparison: 12 02/03/2012  Findings: Normal heart size.  No pleural effusion or edema.  No airspace consolidation.  Hiatal hernia is again noted.  Dedicated views of the left ribs show mildly displaced fifth, sixth and seventh rib fractures.  IMPRESSION:  1.  Left rib fractures. 2.  No acute cardiopulmonary abnormalities.   Original Report Authenticated By: Kerby Moors, M.D.    Ct Head Wo Contrast  12/05/2012  *RADIOLOGY REPORT*  Clinical Data: syncope  CT HEAD WITHOUT CONTRAST  Technique:  Contiguous axial images were obtained from the base of the skull through the vertex without contrast.  Comparison: 06/26/12  Findings: There is diffuse patchy low density throughout the subcortical and  periventricular white matter consistent with chronic small vessel ischemic change.  There is prominence of the sulci and ventricles consistent with brain atrophy.  There is no evidence for acute brain infarct, hemorrhage or mass.  The paranasal sinuses and mastoid air cells are clear.  The the skull is intact.  IMPRESSION:  1.  No  acute intracranial abnormalities.   Original Report Authenticated By: Kerby Moors, M.D.        MDM  Xrays. Vicodin po.  Reviewed nursing notes and prior charts for additional history.   Ct head, pt on xarelto.   Pain not controlled w po meds.   Iv ns. Morphine iv.  Discussed xrays w pt/family  Pt lives alone although has some help during the day, does not feel can manage at home given multiple rib fxs, pain, mobility issues.   Trauma service called.   Clarification, pt had been on xarelto previously, and was initially on pts med list today - pt not currently taking, inr normal.    Discussed w trauma md on call ,they will see/admit.    Recheck abd soft nt.     Mirna Mires, MD 12/05/12 [phone]

## 2012-12-06 ENCOUNTER — Inpatient Hospital Stay (HOSPITAL_COMMUNITY): Payer: Medicare Other

## 2012-12-06 DIAGNOSIS — W19XXXA Unspecified fall, initial encounter: Secondary | ICD-10-CM | POA: Diagnosis present

## 2012-12-06 DIAGNOSIS — S2242XA Multiple fractures of ribs, left side, initial encounter for closed fracture: Secondary | ICD-10-CM | POA: Diagnosis present

## 2012-12-06 LAB — URINALYSIS, ROUTINE W REFLEX MICROSCOPIC
Bilirubin Urine: NEGATIVE
Hgb urine dipstick: NEGATIVE
Nitrite: POSITIVE — AB
Specific Gravity, Urine: 1.027 (ref 1.005–1.030)
Urobilinogen, UA: 0.2 mg/dL (ref 0.0–1.0)
pH: 5 (ref 5.0–8.0)

## 2012-12-06 LAB — BASIC METABOLIC PANEL
CO2: 25 mEq/L (ref 19–32)
Chloride: 105 mEq/L (ref 96–112)
GFR calc Af Amer: 62 mL/min — ABNORMAL LOW (ref >90)
Potassium: 4.4 mEq/L (ref 3.5–5.1)

## 2012-12-06 LAB — CBC
Hemoglobin: 8.8 g/dL — ABNORMAL LOW (ref 12.0–15.0)
MCH: 28.9 pg (ref 26.0–34.0)
MCV: 91.1 fL (ref 78.0–100.0)
Platelets: 204 10*3/uL (ref 150–400)
RBC: 3.05 MIL/uL — ABNORMAL LOW (ref 3.87–5.11)
WBC: 4.1 10*3/uL (ref 4.0–10.5)

## 2012-12-06 LAB — URINE MICROSCOPIC-ADD ON

## 2012-12-06 LAB — MAGNESIUM: Magnesium: 1.8 mg/dL (ref 1.5–2.5)

## 2012-12-06 MED ORDER — SODIUM CHLORIDE 0.9 % IJ SOLN
3.0000 mL | INTRAMUSCULAR | Status: DC | PRN
  Administered 2012-12-06: 3 mL via INTRAVENOUS

## 2012-12-06 MED ORDER — METFORMIN HCL 500 MG PO TABS
250.0000 mg | ORAL_TABLET | Freq: Two times a day (BID) | ORAL | Status: DC
  Administered 2012-12-06 – 2012-12-09 (×6): 250 mg via ORAL
  Filled 2012-12-06 (×8): qty 1

## 2012-12-06 NOTE — Progress Notes (Signed)
Trauma Service Note  Subjective: The patient is very hard of hearing.  Has left sided chest wall pain.  Wanted to know if the pain specialist was gong to come here today to inject her.  Objective: Vital signs in last 24 hours: Temp:  [97.5 F (36.4 C)-98 F (36.7 C)] 97.5 F (36.4 C) (12/30 0447) Pulse Rate:  [55-78] 55  (12/30 0447) Resp:  [12-17] 14  (12/30 0447) BP: (103-147)/(43-72) 122/65 mmHg (12/30 0447) SpO2:  [95 %-100 %] 99 % (12/30 0447) Weight:  [59.9 kg (132 lb 0.9 oz)-64.7 kg (142 lb 10.2 oz)] 64.7 kg (142 lb 10.2 oz) (12/29 1736)    Intake/Output from previous day: 12/29 0701 - 12/30 0700 In: 1057.5 [I.V.:857.5; IV Piggyback:200] Out: 1 [Urine:1] Intake/Output this shift:    General: No acute distress.  Her vitals are good and her oxygen saturations on room air were 100%.  Lungs: Clear to auscultation.  CXR shows no PTX  Abd: Soft, non-tender  Extremities: No DVT signs or symptoms.  Neuro: Intact, just hard of hearing.  Lab Results: CBC   Basename 12/06/12 0420 12/05/12 1228  WBC 4.1 6.0  HGB 8.8* 9.4*  HCT 27.8* 30.2*  PLT 204 255   BMET  Basename 12/05/12 1228  NA 141  K 4.4  CL 105  CO2 28  GLUCOSE 151*  BUN 23  CREATININE 0.85  CALCIUM 9.3   PT/INR  Basename 12/05/12 1228  LABPROT 12.9  INR 0.98   ABG No results found for this basename: PHART:2,PCO2:2,PO2:2,HCO3:2 in the last 72 hours  Studies/Results: Dg Ribs Unilateral W/chest Left  12/05/2012  *RADIOLOGY REPORT*  Clinical Data: Fall.  Pain  LEFT RIBS AND CHEST - 3+ VIEW  Comparison: 12 02/03/2012  Findings: Normal heart size.  No pleural effusion or edema.  No airspace consolidation.  Hiatal hernia is again noted.  Dedicated views of the left ribs show mildly displaced fifth, sixth and seventh rib fractures.  IMPRESSION:  1.  Left rib fractures. 2.  No acute cardiopulmonary abnormalities.   Original Report Authenticated By: Kerby Moors, M.D.    Ct Head Wo  Contrast  12/05/2012  *RADIOLOGY REPORT*  Clinical Data: syncope  CT HEAD WITHOUT CONTRAST  Technique:  Contiguous axial images were obtained from the base of the skull through the vertex without contrast.  Comparison: 06/26/12  Findings: There is diffuse patchy low density throughout the subcortical and periventricular white matter consistent with chronic small vessel ischemic change.  There is prominence of the sulci and ventricles consistent with brain atrophy.  There is no evidence for acute brain infarct, hemorrhage or mass.  The paranasal sinuses and mastoid air cells are clear.  The the skull is intact.  IMPRESSION:  1.  No acute intracranial abnormalities.   Original Report Authenticated By: Kerby Moors, M.D.     Anti-infectives: Anti-infectives    None      Assessment/Plan: s/p  Advance diet Plan for discharge tomorrow Saline lock IV. OT/PT Transfer to 5N or 6N  LOS: 1 day   Kathryne Eriksson. Dahlia Bailiff, MD, FACS [phone] Trauma Surgeon 12/06/2012

## 2012-12-06 NOTE — Evaluation (Signed)
Physical Therapy Evaluation Patient Details Name: [name] MRN: [id number] DOB: 04-12-1919 Today's Date: 12/06/2012 Time: [id number] PT Time Calculation (min): 28 min  PT Assessment / Plan / Recommendation Clinical Impression  Pt is a 78 yo female presenting with L flank pain due to broken ribs s/p mechanical fall at home. patient lived alone PTA but now requires assist for all mobility and has balance impairments requiring use of RW and assist for safe ambultion. patient requires 24/7 supervision/assist upon d/c and would benefit from SNF to address generalized weakness and balance deficits to improve safety with safe independent function prior to transistion home.    PT Assessment  Patient needs continued PT services    Follow Up Recommendations  SNF;Supervision/Assistance - 24 hour    Does the patient have the potential to tolerate intense rehabilitation      Barriers to Discharge Decreased caregiver support      Equipment Recommendations  Rolling walker with 5" wheels    Recommendations for Other Services     Frequency Min 3X/week    Precautions / Restrictions Precautions Precautions: Fall Restrictions Weight Bearing Restrictions: No   Pertinent Vitals/Pain 8/10 L flank pain      Mobility  Bed Mobility Bed Mobility: Supine to Sit Supine to Sit: With rails;HOB elevated;3: Mod assist Details for Bed Mobility Assistance: limited use of L UE due to pain, assist to complete roll to R and trunk elevation Transfers Transfers: Sit to Stand;Stand Pivot Transfers Sit to Stand: 4: Min assist;With upper extremity assist;From bed Stand Pivot Transfers: 4: Min assist (with RW) Patient with urinary incontinence upon initial stand. Pt std pvt to BSC. Details for Transfer Assistance: v/c's fro hand placement Ambulation/Gait Ambulation/Gait Assistance: 4: Min assist Ambulation Distance (Feet): 20 Feet Assistive device: Rolling walker Ambulation/Gait Assistance Details:  guarded, slow due to L flank pain from broken ribs, short, shuffled steps Gait Pattern: Step-through pattern;Decreased stride length Stairs: No    Shoulder Instructions     Exercises     PT Diagnosis: Difficulty walking;Generalized weakness  PT Problem List: Decreased strength;Decreased activity tolerance;Decreased balance;Decreased mobility PT Treatment Interventions: Gait training;Functional mobility training;Therapeutic activities;Therapeutic exercise;Balance training   PT Goals Acute Rehab PT Goals PT Goal Formulation: With patient Time For Goal Achievement: 12/13/12 Potential to Achieve Goals: Good Pt will go Supine/Side to Sit: with supervision;with HOB 0 degrees PT Goal: Supine/Side to Sit - Progress: Goal set today Pt will go Sit to Supine/Side: with min assist;with HOB 0 degrees PT Goal: Sit to Supine/Side - Progress: Goal set today Pt will go Sit to Stand: with supervision PT Goal: Sit to Stand - Progress: Goal set today Pt will Ambulate: 51 - 150 feet;with supervision;with rolling walker PT Goal: Ambulate - Progress: Goal set today  Visit Information  Last PT Received On: 12/06/12 Assistance Needed: +1    Subjective Data  Subjective: Pt received supine in bed with c/o L chest/rib pain. Patient Stated Goal: home   Prior [place] Lives With: Alone Available Help at Discharge: Available PRN/intermittently;Friend(s);Family Type of Home: House Home Access: Stairs to enter [employer] of Steps: 1 Entrance Stairs-Rails: None Home Layout: One level Bathroom Shower/Tub: Chiropodist: Standard Bathroom Accessibility: Yes How Accessible: Accessible via walker Home Adaptive Equipment: Walker - rolling;Tub transfer bench;Straight cane Prior Function Level of Independence: Independent with assistive device(s) (with cane) Able to Take Stairs?: Yes Driving: No Vocation: Retired Corporate investment banker: HOH Dominant  Hand: Right    Cognition  Overall Cognitive  Status: Appears within functional limits for tasks assessed/performed Arousal/Alertness: Awake/alert Orientation Level: Oriented X4 / Intact Behavior During Session: [facility] for tasks performed    Extremity/Trunk Assessment Right Upper Extremity Assessment RUE ROM/Strength/Tone: Deficits RUE ROM/Strength/Tone Deficits: shld flex ROM to 45 from recent rotator cuff repair Left Upper Extremity Assessment LUE ROM/Strength/Tone: Deficits;Due to pain LUE ROM/Strength/Tone Deficits: L shoulder flex limited by pain from fx ribs Right Lower Extremity Assessment RLE ROM/Strength/Tone: Within functional levels Left Lower Extremity Assessment LLE ROM/Strength/Tone: Within functional levels Trunk Assessment Trunk Assessment: Normal   Balance    End of Session PT - End of Session Equipment Utilized During Treatment: Gait belt Activity Tolerance: Patient limited by pain Patient left: in chair;with call bell/phone within reach Nurse Communication: Mobility status  GP     Kingsley Callander 12/06/2012, 3:10 PM  Kittie Plater, PT, DPT Pager #: [phone] Office #: [phone]

## 2012-12-06 NOTE — Progress Notes (Signed)
UR completed 

## 2012-12-06 NOTE — Progress Notes (Signed)
Report called to [facility] RN pt going to 6N14 via w/c with belongings. Called Anselm Lis regarding new room number. Suezanne Cheshire

## 2012-12-07 MED ORDER — OXYCODONE HCL 5 MG PO TABS: 5.0000 mg | ORAL_TABLET | Freq: Four times a day (QID) | ORAL | Status: DC | PRN

## 2012-12-07 NOTE — Progress Notes (Signed)
Physical Therapy Treatment Patient Details Name: [name] MRN: [id number] DOB: May 13, 1919 Today's Date: 12/07/2012 Time: [id number] PT Time Calculation (min): 27 min  PT Assessment / Plan / Recommendation Comments on Treatment Session  Pt still very limited by pain and requires assist for activities.  Patient had difficulty get to bedside commode and is having trouble with deep breathing secondary to pain.  Do no feel patient is safe with current level of mobility.  Rec SNF.    Follow Up Recommendations  SNF;Supervision/Assistance - 24 hour     Does the patient have the potential to tolerate intense rehabilitation     Barriers to Discharge        Equipment Recommendations  Rolling walker with 5" wheels    Recommendations for Other Services    Frequency Min 3X/week   Plan Discharge plan remains appropriate    Precautions / Restrictions Precautions Precautions: Fall Restrictions Weight Bearing Restrictions: No   Pertinent Vitals/Pain 6/10 baseline; 8-9/10 with some mobility activities    Mobility  Bed Mobility Bed Mobility: Supine to Sit;Sitting - Scoot to Edge of Bed Supine to Sit: With rails;HOB elevated;3: Mod assist Sitting - Scoot to Edge of Bed: 4: Min assist (assist with chuck bad to slide pt to EOB as patient had diff) Details for Bed Mobility Assistance: pt requires assist to complete roll to R and trunk elevation Transfers Transfers: Sit to Stand;Stand Pivot Transfers Sit to Stand: 4: Min assist;With upper extremity assist;From bed;From chair/3-in-1 Stand Pivot Transfers: 4: Min assist (with RW) Details for Transfer Assistance: v/c's fro hand placement; inital attempt from bed unsuccessful due to increased pain; VC's for encouragement; patient required elevated bed to perform sit to stand Ambulation/Gait Ambulation/Gait Assistance: 4: Min assist Ambulation Distance (Feet): 16 Feet Assistive device: Rolling walker Ambulation/Gait Assistance Details:  guarded, slow due to L flank pain from broken ribs, short, shuffled steps Gait Pattern: Step-through pattern;Decreased stride length Gait velocity: significantly decreased General Gait Details: patient very guarded secondary to pain with flexed posture.  Stairs: No    Exercises Other Exercises Other Exercises: Diaphramatic breating and ROM activities (10 reps) Other Exercises: pursed lip breathing activities (5 breaths) Other Exercises: chest expansion exercises (10 reps)    PT Goals Acute Rehab PT Goals PT Goal Formulation: With patient Time For Goal Achievement: 12/13/12 Potential to Achieve Goals: Good Pt will go Supine/Side to Sit: with supervision;with HOB 0 degrees PT Goal: Supine/Side to Sit - Progress: Progressing toward goal Pt will go Sit to Supine/Side: with min assist;with HOB 0 degrees PT Goal: Sit to Supine/Side - Progress: Progressing toward goal Pt will go Sit to Stand: with supervision PT Goal: Sit to Stand - Progress: Progressing toward goal Pt will Ambulate: 51 - 150 feet;with supervision;with rolling walker PT Goal: Ambulate - Progress: Progressing toward goal  Visit Information  Last PT Received On: 12/07/12 Assistance Needed: +1    Subjective Data  Subjective: Pt states that it only hurts when i move Patient Stated Goal: home   Cognition  Overall Cognitive Status: Appears within functional limits for tasks assessed/performed Arousal/Alertness: Awake/alert Orientation Level: Oriented X4 / Intact Behavior During Session: [facility] for tasks performed       End of Session PT - End of Session Equipment Utilized During Treatment: Gait belt Activity Tolerance: Patient limited by pain Patient left: in chair;with call bell/phone within reach Nurse Communication: Mobility status   GP     Duncan Dull 12/07/2012, 8:54 AM Alben Deeds, PT DPT  [phone]

## 2012-12-07 NOTE — Evaluation (Signed)
Occupational Therapy Evaluation Patient Details Name: [name] MRN: [id number] DOB: 1918-12-23 Today's Date: 12/07/2012 Time: [id number] OT Time Calculation (min): 26 min  OT Assessment / Plan / Recommendation Clinical Impression  78 yo female that lives alone and sustained a fall. Pt now presents with  flank pain due to Lt side broken ribs. Pt will require 24/7 (A) and recommend Snf at this time. Pt is limited by pain and requires (A) for all adls. Pt agreeable to SNF placement.    OT Assessment  Patient needs continued OT Services    Follow Up Recommendations  SNF    Barriers to Discharge Decreased caregiver support    Equipment Recommendations  3 in 1 bedside comode (RW)    Recommendations for Other Services    Frequency  Min 2X/week    Precautions / Restrictions Precautions Precautions: Fall Restrictions Weight Bearing Restrictions: No   Pertinent Vitals/Pain 8 out 10 with mobility Pt pain decreased with supine position    ADL  Grooming: Wash/dry hands;Min guard Where Assessed - Grooming: Unsupported standing Lower Body Bathing: Moderate assistance (peri hygiene at sink level) Where Assessed - Lower Body Bathing: Supported standing Toilet Transfer: Minimal assistance Toilet Transfer Method: Sit to Loss adjuster, chartered: Regular height toilet;Grab bars Toileting - Clothing Manipulation and Hygiene: Minimal assistance Where Assessed - Best boy and Hygiene: Sit to stand from 3-in-1 or toilet Equipment Used: Rolling walker Transfers/Ambulation Related to ADLs: Pt with min v/c for hand placement. Pt limiited by Lt side pain pushing up from chair. Pt stopped grabbed side and required ~45 second rest break prior to second attempt ADL Comments: Pt requesting to return to supine on arrival. Pt sitting up in chair s/p PT session. Pt encouraged to attempt toilet transfer first and then return to supine. pt completed toilet transfer with  positive void stool and bladder during session. Pt assisted to sink level for grooming and peri hygiene. Pt returned to supine Rt side lying with pillows for comfort. pt with decreased pain side position . Pt with limited AROM in bil UE due to pain from Lt ribs and in addition previous shoulder surgeries. Pt states "i had both Rowters fixed". Pt with rotator cuff surgery PTA which affects shoulder abduction and flexion.    OT Diagnosis: Generalized weakness;Acute pain  OT Problem List: Decreased strength;Decreased activity tolerance;Impaired balance (sitting and/or standing);Decreased safety awareness;Decreased knowledge of use of DME or AE;Decreased knowledge of precautions;Pain;Impaired UE functional use (pt with impaired UE due to pain from rib fx) OT Treatment Interventions: Self-care/ADL training;DME and/or AE instruction;Therapeutic activities;Balance training;Patient/family education   OT Goals Acute Rehab OT Goals OT Goal Formulation: With patient Time For Goal Achievement: 12/21/12 Potential to Achieve Goals: Good ADL Goals Pt Will Perform Grooming: with modified independence;Standing at sink ADL Goal: Grooming - Progress: Goal set today Pt Will Perform Upper Body Bathing: with modified independence;Standing at sink ADL Goal: Upper Body Bathing - Progress: Goal set today Pt Will Perform Lower Body Bathing: with min assist;Sit to stand from chair;with adaptive equipment ADL Goal: Lower Body Bathing - Progress: Goal set today Pt Will Perform Upper Body Dressing: with modified independence;Sitting, chair ADL Goal: Upper Body Dressing - Progress: Goal set today Pt Will Perform Lower Body Dressing: with min assist;Sit to stand from chair;with adaptive equipment ADL Goal: Lower Body Dressing - Progress: Goal set today Pt Will Transfer to Toilet: with supervision;Ambulation;3-in-1 ADL Goal: Toilet Transfer - Progress: Goal set today Pt Will Perform Toileting - Hygiene: with  min assist;Sit  to stand from 3-in-1/toilet ADL Goal: Toileting - Hygiene - Progress: Goal set today Miscellaneous OT Goals Miscellaneous OT Goal #1: Pt will complete bed mobility Supervision level hob <20 degrees as precursor to adls OT Goal: Miscellaneous Goal #1 - Progress: Goal set today  Visit Information  Last OT Received On: 12/07/12 Assistance Needed: +1    Subjective Data  Subjective: "you are good at what you do"- pt's response after assistance to bathroom , hygiene at sink and positioning in bed with pillows/blankets for comfort Patient Stated Goal: to go to therapy to return home independent   Prior [facility] Lives With: Alone Available Help at Discharge: Available PRN/intermittently;Friend(s);Family;Personal care attendant Type of Home: House Home Access: Stairs to enter [employer] of Steps: 1 Entrance Stairs-Rails: None Home Layout: One level Bathroom Shower/Tub: Chiropodist: Standard Bathroom Accessibility: Yes How Accessible: Accessible via walker Home Adaptive Equipment: Walker - rolling;Tub transfer bench;Straight cane Prior Function Level of Independence: Independent with assistive device(s) Able to Take Stairs?: Yes Driving: No Vocation: Retired Corporate investment banker: HOH Dominant Hand: Right      Cognition  Overall Cognitive Status: Appears within functional limits for tasks assessed/performed Arousal/Alertness: Awake/alert Orientation Level: Oriented X4 / Intact Behavior During Session: [facility] for tasks performed    Extremity/Trunk Assessment Right Upper Extremity Assessment RUE ROM/Strength/Tone: Deficits RUE ROM/Strength/Tone Deficits: shoulder flexion to ~45 degrees and abduction 15 degress. Pt has full AROM elbow wrist and hands. pt with arthritis in hand but states "that doesn't bother me that much" RUE Sensation: WFL - Light Touch RUE Coordination: WFL - gross/fine motor Left Upper Extremity  Assessment LUE ROM/Strength/Tone: Deficits LUE ROM/Strength/Tone Deficits: AROM shoulder flexion ~30 degrees and abduction ~10-15 degrees  Pt with previous surgery per patient and with facial grimace due to Lt rib fx LUE Sensation: WFL - Light Touch LUE Coordination: WFL - gross/fine motor Trunk Assessment Trunk Assessment: Normal     Mobility Bed Mobility Bed Mobility: Supine to Sit;Sitting - Scoot to Edge of Bed Sit to Supine: 3: Mod assist;HOB elevated Details for Bed Mobility Assistance: Pt required min v/c for sequence and (A) with BIL LE to return to supine. pt HOB ~20 degrees to decrease Lt flank pain. pt descending on to Rt elbow . pt positioned into Rt side lying with pillow between knees, hugging pillow and blanket along the spine . Transfers Sit to Stand: 4: Min assist Stand to Sit: 4: Min assist Details for Transfer Assistance: min v/c for hand placement and safety with RW     End of Session OT - End of Session Activity Tolerance: Patient tolerated treatment well Patient left: in bed;with call bell/phone within reach Nurse Communication: Mobility status;Precautions  GO     Veneda Melter 12/07/2012, 11:12 AM Pager: [phone]

## 2012-12-07 NOTE — Clinical Social Work Note (Addendum)
Clinical Social Work Department CLINICAL SOCIAL WORK PLACEMENT NOTE 12/07/2012  Patient:  [name], [name]  Account Number:  [id number] Admit date:  12/05/2012  Clinical Social Worker:  Barbette Or, LCSW  Date/time:  12/06/2012 03:30 PM  Clinical Social Work is seeking post-discharge placement for this patient at the following level of care:   SKILLED NURSING   (*CSW will update this form in Epic as items are completed)   12/06/2012  Patient/family provided with [facility] Department of Clinical Social Work's list of facilities offering this level of care within the geographic area requested by the patient (or if unable, by the patient's family).  12/06/2012  Patient/family informed of their freedom to choose among providers that offer the needed level of care, that participate in Medicare, Medicaid or managed care program needed by the patient, have an available bed and are willing to accept the patient.  12/06/2012  Patient/family informed of [facility]' ownership interest in [facility], as well as of the fact that they are under no obligation to receive care at this facility.  PASARR submitted to EDS on 12/06/2012 PASARR number received from EDS on 12/06/2012  FL2 transmitted to all facilities in geographic area requested by pt/family on  12/06/2012 FL2 transmitted to all facilities within larger geographic area on   Patient informed that his/her managed care company has contracts with or will negotiate with  certain facilities, including the following:     Patient/family informed of bed offers received:  12/07/2012   Patient chooses bed at   [facility] Physician recommends and patient chooses bed at    Patient to be transferred to [facility] on 12/10/2011   Patient to be transferred to facility by  [facility]  The following physician request were entered in Epic:   Additional Comments: 12/30 - Patient family requesting [place], [place], or  [place]  12/31 - Patient requested [facility] after speaking with MD

## 2012-12-07 NOTE — Clinical Social Work Note (Signed)
Clinical Social Work Department BRIEF PSYCHOSOCIAL ASSESSMENT 12/07/2012  Patient:  [name], [name]     Account Number:  [id number]     Admit date:  12/05/2012  Clinical Social Worker:  Myles Lipps  Date/Time:  12/06/2012 03:30 PM  Referred by:  RN  Date Referred:  12/06/2012 Referred for  SNF Placement   Other Referral:   Interview type:  Family Other interview type:   Patient working with therapies and getting cleaned up due to incontinent episode.  CSW to follow up with patient directly in the morning.  CSW met with patient family in hallway outside patient room.    PSYCHOSOCIAL DATA Living Status:  ALONE Admitted from facility:   Level of care:   Primary support name:  Tanyeka, Mathwig  [phone]   Decelles,Christina  [phone] Primary support relationship to patient:  CHILD, ADULT Degree of support available:   Strong    CURRENT CONCERNS Current Concerns  Post-Acute Placement   Other Concerns:    SOCIAL WORK ASSESSMENT / PLAN Clinical Social Worker met with patient family outside the room to offer support and discuss patient plans at discharge.  Patient was working with physical therapy during CSW assessment, however CSW met with patient son, daughter in Sports coach, and granddaughter.  Patient has had previous falls once in which she went to [facility] for rehab and another where she stayed for 6 months with her granddaughter.  Patient family agrees that patient is not able to return home alone after this fall without 24 hour supervision.  Patient family is not able to provide 24 hour supervision at this time.  Patient family agreeable to SNF search in [facility] with preference to [place], [facility], or [place].  Patient family understands that patient must discharge home or to a facility by Wednesday December 08, 2012.    CSW initiated SNF search in [facility] and left a message for patient family facility preference.  Patient family willing to look into  private duty with home health if needed at the time of discharge, however prefer to have patient go to SNF short term.  CSW to follow up with patient and patient family to provide bed offers and facilitate patient discharge needs once medically stable.   Assessment/plan status:  Psychosocial Support/Ongoing Assessment of Needs Other assessment/ plan:   Information/referral to community resources:   Holiday representative provided patient family with facility list    PATIENT'S/FAMILY'S RESPONSE TO PLAN OF CARE: Patient is alert and oriented x4, however working with therapy and unable to fully participate in CSW assessment. Patient family is very concerned about patient going home alone and are unable to provide 24 hour support for patient at this time.  Patient family appreciative of CSW support and understand that if a bed is not available for patient on Wednesday, patient will have to discharge home.    Barbette Or, [place]

## 2012-12-07 NOTE — Discharge Summary (Signed)
Physician Discharge Summary  Patient ID: [name] MRN: [id number] DOB/AGE: 76-Sep-1920 76 y.o.  Admit date: 12/05/2012 Discharge date: 12/09/2012  Discharge Diagnoses Patient Active Problem List   Diagnosis Date Noted  . Fall 12/06/2012  . Multiple fractures of ribs of left side 12/06/2012  . Complete rotator cuff rupture of left shoulder 08/04/2012  . Overactive bladder 01/07/2012  . DVT of leg (deep venous thrombosis) 01/07/2012  . DIARRHEA 09/06/2008  . HYPERLIPIDEMIA 03/09/2008  . RESTLESS LEG SYNDROME 03/09/2008  . HYPERTENSION 03/09/2008  . ESOPHAGEAL STRICTURE 03/09/2008  . GERD 03/09/2008  . GASTRITIS, ACUTE 03/09/2008  . DUODENITIS 03/09/2008  . HIATAL HERNIA 03/09/2008  . DIVERTICULOSIS, COLON 03/09/2008  . CYST AND PSEUDOCYST OF PANCREAS 03/09/2008  . POLYMYALGIA RHEUMATICA 03/09/2008  . INSOMNIA 03/09/2008  . SLEEP APNEA 03/09/2008  . FATIGUE 03/09/2008  . CROHN'S DISEASE, HX OF 03/09/2008     Consultants None  Procedures None  HPI:  78 year old female who has problems with chronic pain in her back and had a recent epidural. She was scheduled to see Dr. Maryjean Ka in the pain clinic tomorrow for right hip pain which has become progressively worse.  She is on Xeralto for a recent DVT, but has been off of it for a few days prior to a potential hip injection.  She lives alone and currently she can get around some of her home she uses a walker and has a progressive increase in inability to ambulate because of her right hip pain. She has lidocaine patches at 2 places on her right hip. Because of her chronic pain she takes Percocet.   On 12/05/12 (day of admission) she had a Percocet prior to breakfast and fell asleep at the table after eating. When she tried to get up from the breakfast table she fell. She denies loss of consciousness she developed extreme pain and discomfort with any movement and was brought to the emergency room by her family. Workup in the  emergency room shows her labs were essentially normal with some mild anemia, low albumin and total protein. Mild decline in GFR and glucose elevation. CT of the head was unremarkable. Chest x-ray three-view & rib series shows no pleural effusion or edema and no airspace consolidation; hiatal hernia and mildly displaced left fifth sixth and seventh rib fractures.   Hospital Course:  She was admitted to the stepdown unit for observation and further treatment as indicated. A repeat LE doppler was done which showed her previous DVT resolved.  Diet was advanced as tolerated.  On HD #3, the patient was voiding well, tolerating diet, ambulating well with assistance, pain well controlled, vital signs stable, and felt stable for discharge to skilled nursing facility. Prior to discharge she was diagnosed with a UTI due to E. Coli. She was started on ciprofloxacin and should continue that for 3 days at the SNF. Patient will follow up in our office as needed and knows to call with questions or concerns.     Medication List     As of 12/09/2012  8:23 AM    STOP taking these medications         oxyCODONE-acetaminophen 5-325 MG per tablet   Commonly known as: PERCOCET/ROXICET      TAKE these medications         ciprofloxacin 500 MG tablet   Commonly known as: CIPRO   Take 1 tablet (500 mg total) by mouth 2 (two) times daily.      ergocalciferol 50000 UNITS capsule  Commonly known as: VITAMIN D2   Take 50,000 Units by mouth 2 (two) times a week. Take twice weekly on Wednesday and Sunday      gabapentin 100 MG capsule   Commonly known as: NEURONTIN   Take 100 mg by mouth at bedtime.      gabapentin 300 MG capsule   Commonly known as: NEURONTIN   Take 300 mg by mouth 2 (two) times daily.      l-methylfolate-B6-B12 3-35-2 MG Tabs   Commonly known as: METANX   Take 1 tablet by mouth 2 (two) times daily.      metFORMIN 500 MG tablet   Commonly known as: GLUCOPHAGE   Take 250 mg by mouth 2 (two)  times daily with a meal.      mirtazapine 15 MG tablet   Commonly known as: REMERON   Take 15 mg by mouth at bedtime.      oxyCODONE 5 MG immediate release tablet   Commonly known as: Oxy IR/ROXICODONE   Take 1-2 tablets (5-10 mg total) by mouth every 6 (six) hours as needed (1 tablet for mild pain, 2 tablets for moderate to severe pain).      REQUIP XL 6 MG Tb24   Generic drug: Ropinirole HCl   Take 1 tablet by mouth every evening.         Follow-up Information    Call [facility]. (As needed)    Contact information:   [place] [address] [phone]       Follow up with Benito Mccreedy, MD.   Contact information:   [address], [id number] [place] [address] [phone]             Signed: Lisette Abu, PA-C Pager: [phone] General Trauma PA Pager: [phone]  12/07/2012, 3:05 PM

## 2012-12-07 NOTE — Progress Notes (Signed)
LOS: 2 days   Subjective: Pt in pain all the time, but pain in ribs is well controlled and pain meds seem to help her back and hip too.  Pt up with PT yesterday.  Pt tolerating diet well and able to get to bedside commode, no BM yet.  Pt has not been doing her IS and does not have one in the room.  Instructed the pt on how to use one.  Objective: Vital signs in last 24 hours: Temp:  [97.6 F (36.4 C)-97.9 F (36.6 C)] 97.6 F (36.4 C) (12/31 0640) Pulse Rate:  [57-79] 72  (12/31 0640) Resp:  [16-19] 18  (12/31 0640) BP: (114-145)/(45-58) 145/58 mmHg (12/31 0640) SpO2:  [93 %-99 %] 99 % (12/31 0640) Last BM Date: 12/05/12  Lab Results:  CBC  Basename 12/06/12 0420 12/05/12 1228  WBC 4.1 6.0  HGB 8.8* 9.4*  HCT 27.8* 30.2*  PLT 204 255   BMET  Basename 12/06/12 1235 12/05/12 1228  NA 141 141  K 4.4 4.4  CL 105 105  CO2 25 28  GLUCOSE 116* 151*  BUN 18 23  CREATININE 0.91 0.85  CALCIUM 8.5 9.3    Imaging: Dg Ribs Unilateral W/chest Left  12/05/2012  *RADIOLOGY REPORT*  Clinical Data: Fall.  Pain  LEFT RIBS AND CHEST - 3+ VIEW  Comparison: 12 02/03/2012  Findings: Normal heart size.  No pleural effusion or edema.  No airspace consolidation.  Hiatal hernia is again noted.  Dedicated views of the left ribs show mildly displaced fifth, sixth and seventh rib fractures.  IMPRESSION:  1.  Left rib fractures. 2.  No acute cardiopulmonary abnormalities.   Original Report Authenticated By: Kerby Moors, M.D.    Ct Head Wo Contrast  12/05/2012  *RADIOLOGY REPORT*  Clinical Data: syncope  CT HEAD WITHOUT CONTRAST  Technique:  Contiguous axial images were obtained from the base of the skull through the vertex without contrast.  Comparison: 06/26/12  Findings: There is diffuse patchy low density throughout the subcortical and periventricular white matter consistent with chronic small vessel ischemic change.  There is prominence of the sulci and ventricles consistent with brain atrophy.   There is no evidence for acute brain infarct, hemorrhage or mass.  The paranasal sinuses and mastoid air cells are clear.  The the skull is intact.  IMPRESSION:  1.  No acute intracranial abnormalities.   Original Report Authenticated By: Kerby Moors, M.D.    Dg Chest Port 1 View  12/06/2012  *RADIOLOGY REPORT*  Clinical Data: Left rib fracture.  PORTABLE CHEST - 1 VIEW  Comparison: 12/05/2012  Findings: 0637 hours.  Lungs are hyperexpanded but clear.  There is minimal atelectasis at the left base. The cardiopericardial silhouette is enlarged. Telemetry leads overlie the chest. Left- sided rib fractures again noted.  No pneumothorax.  IMPRESSION: Cardiomegaly with emphysema.   Original Report Authenticated By: Misty Stanley, M.D.      PE: General appearance: alert, cooperative and no distress Resp: clear to auscultation bilaterally Chest wall: no tenderness, left sided chest wall tenderness Cardio: regular rate and rhythm and + 3/6 murmur GI: soft, non-tender; bowel sounds normal; no masses,  no organomegaly Extremities: no edema, redness or tenderness in the calves or thighs Pulses: 2+ and symmetric   Assessment/Plan: Fall  Mildly displaced fxof the L 5-7th rib - pulmonary toilet Chronic back pain and right hip pain DVT with Xeralto therapy, off for the last 8 days prior to pain mgt procedure Multiple medical problems-PMR,  GERD, Esog. Stricture, hiatal hearnia, crohns, OA bladder, RLS, HTN, HLD, on home meds FEN - reg diet Pain - well controlled with tylenol and oxy IR VTE - SCD's, Lovenox, cont to hold [employer] - PT/OT, SNF vs home.  Pt prefers to go home with home health, she has someone who helps her at home and would like to see if she could also help more around the clock as well as transferring her so that she doesn't have to go to a SNF.  Will have to discuss with family if this is feasible.       Coralie Keens, PA-C Pager: [phone] General Trauma PA Pager: [phone]    12/07/2012

## 2012-12-07 NOTE — Progress Notes (Signed)
She looks fine.  Sore in left chest but otherwise looks okay.  SNF recommended.  I agree with this recommendation for short term rehab facility.

## 2012-12-07 NOTE — Clinical Social Work Note (Signed)
Clinical Social Worker continuing to follow patient and family for support and to facilitate patient discharge needs.  Patient and patient family are agreeable to SNF placement at this time.  Patient spoke with MD/RN regarding facility choice in which patient requested [facility] due to a friend recommendation.  CSW spoke with [facility] who are agreeable to admit patient Thursday 12/10/2011.  Patient family plans to be at the hospital by 11:30 where they will complete paperwork for the facility and provide patient with transportation to facility.  CSW attempted to relay patient acceptance to [facility], however patient asleep - RN agreeable to inform patient when she awakes.  CSW spoke with patient son who is agreeable and appreciative of plan.  CSW remains available for support and will facilitate patient discharge needs on Thursday 12/10/2011.  Barbette Or, [place]

## 2012-12-08 NOTE — Progress Notes (Signed)
I have seen and examined the patient and agree with the assessment and plans.  Jamaris Theard A. Ninfa Linden  MD, FACS

## 2012-12-08 NOTE — Progress Notes (Signed)
  Subjective: Feels weaker today, but we helped her with IS and she can get it up to 500, but not consistently.   She did sit up in bed with some assist, did it better than I expected.  Objective: Vital signs in last 24 hours: Temp:  [98.2 F (36.8 C)-100.9 F (38.3 C)] 98.2 F (36.8 C) (01/01 0606) Pulse Rate:  [64-99] 64  (01/01 0606) Resp:  [18] 18  (01/01 0606) BP: (142-149)/(56-63) 149/56 mmHg (01/01 0606) SpO2:  [95 %-98 %] 95 % (01/01 0606) Last BM Date: 12/07/12  DIET: regular, Tm 100.9, VSS, but BP is up. No labs,  Intake/Output from previous day: 12/31 0701 - 01/01 0700 In: 240 [P.O.:240] Out: -  Intake/Output this shift:    General appearance: alert, cooperative, no distress and moves slowly, but better than i expected. Resp: BS down some in the bases with some rales.  Lab Results:   [facility] 12/06/12 0420 12/05/12 1228  WBC 4.1 6.0  HGB 8.8* 9.4*  HCT 27.8* 30.2*  PLT 204 255    BMET  Basename 12/06/12 1235 12/05/12 1228  NA 141 141  K 4.4 4.4  CL 105 105  CO2 25 28  GLUCOSE 116* 151*  BUN 18 23  CREATININE 0.91 0.85  CALCIUM 8.5 9.3   PT/INR  Basename 12/05/12 1228  LABPROT 12.9  INR 0.98     Lab 12/05/12 1228  AST 26  ALT 18  ALKPHOS 106  BILITOT 0.2*  PROT 5.9*  ALBUMIN 2.9*     Lipase     Component Value Date/Time   LIPASE 23 01/06/2010 0115     Studies/Results: No results found.  Medications:    . docusate sodium  100 mg Oral Daily  . enoxaparin (LOVENOX) injection  30 mg Subcutaneous Q24H  . gabapentin  300 mg Oral BID  . l-methylfolate-B6-B12  1 tablet Oral BID  . lidocaine  2 patch Transdermal Q24H  . metFORMIN  250 mg Oral BID WC  . mirtazapine  15 mg Oral QHS  . rOPINIRole  2 mg Oral Q8H    Assessment/Plan Fall  Mildly displaced fxof the L 5-7th rib - pulmonary toilet  Chronic back pain and right hip pain  DVT with Xeralto therapy, off for the last 8 days prior to pain mgt procedure  Multiple medical  problems-PMR, GERD, Esog. Stricture, hiatal hearnia, crohns, OA bladder, RLS, HTN, HLD, on home meds  FEN - reg diet  Pain - well controlled with tylenol and oxy IR  VTE - SCD's, Lovenox, cont to hold [facility] TOMORROW 12/09/12   Plan:  Continue to mobilize, IS, she is scheduled for transfer to SNF tomorrow.    LOS: 3 days    Coty Larsh 12/08/2012

## 2012-12-09 DIAGNOSIS — G2581 Restless legs syndrome: Secondary | ICD-10-CM | POA: Diagnosis not present

## 2012-12-09 DIAGNOSIS — K5090 Crohn's disease, unspecified, without complications: Secondary | ICD-10-CM | POA: Diagnosis not present

## 2012-12-09 DIAGNOSIS — E119 Type 2 diabetes mellitus without complications: Secondary | ICD-10-CM | POA: Diagnosis not present

## 2012-12-09 DIAGNOSIS — Z9181 History of falling: Secondary | ICD-10-CM | POA: Diagnosis not present

## 2012-12-09 DIAGNOSIS — M353 Polymyalgia rheumatica: Secondary | ICD-10-CM | POA: Diagnosis not present

## 2012-12-09 DIAGNOSIS — N390 Urinary tract infection, site not specified: Secondary | ICD-10-CM | POA: Diagnosis not present

## 2012-12-09 DIAGNOSIS — IMO0001 Reserved for inherently not codable concepts without codable children: Secondary | ICD-10-CM | POA: Diagnosis not present

## 2012-12-09 DIAGNOSIS — G609 Hereditary and idiopathic neuropathy, unspecified: Secondary | ICD-10-CM | POA: Diagnosis not present

## 2012-12-09 DIAGNOSIS — M6281 Muscle weakness (generalized): Secondary | ICD-10-CM | POA: Diagnosis not present

## 2012-12-09 DIAGNOSIS — M545 Low back pain, unspecified: Secondary | ICD-10-CM | POA: Diagnosis not present

## 2012-12-09 DIAGNOSIS — K222 Esophageal obstruction: Secondary | ICD-10-CM | POA: Diagnosis not present

## 2012-12-09 DIAGNOSIS — S2249XA Multiple fractures of ribs, unspecified side, initial encounter for closed fracture: Secondary | ICD-10-CM | POA: Diagnosis not present

## 2012-12-09 DIAGNOSIS — IMO0002 Reserved for concepts with insufficient information to code with codable children: Secondary | ICD-10-CM | POA: Diagnosis not present

## 2012-12-09 DIAGNOSIS — R269 Unspecified abnormalities of gait and mobility: Secondary | ICD-10-CM | POA: Diagnosis not present

## 2012-12-09 LAB — URINE CULTURE

## 2012-12-09 MED ORDER — CIPROFLOXACIN HCL 500 MG PO TABS: 500.0000 mg | ORAL_TABLET | Freq: Two times a day (BID) | ORAL | Status: AC

## 2012-12-09 MED ORDER — CIPROFLOXACIN HCL 500 MG PO TABS
500.0000 mg | ORAL_TABLET | Freq: Once | ORAL | Status: AC
  Administered 2012-12-09: 500 mg via ORAL
  Filled 2012-12-09: qty 1

## 2012-12-09 NOTE — Progress Notes (Signed)
Pain is reasonably controlled.  SNF is appropriate.  This patient has been seen and I agree with the findings and treatment plan.  Kathryne Eriksson. Dahlia Bailiff, MD, [place] [phone] (pager) [phone] (direct pager) Trauma Surgeon

## 2012-12-09 NOTE — Progress Notes (Signed)
Patient ID: [name], female   DOB: 1919/04/09, 79 y.o.   MRN: [id number]   LOS: 4 days   Subjective: No new c/o. Ready to go to SNF.  Objective: Vital signs in last 24 hours: Temp:  [98.2 F (36.8 C)-99.2 F (37.3 C)] 98.2 F (36.8 C) (01/02 0515) Pulse Rate:  [82-99] 97  (01/02 0515) Resp:  [18-20] 18  (01/02 0515) BP: (153-180)/(56-89) 153/56 mmHg (01/02 0515) SpO2:  [97 %-100 %] 100 % (01/02 0515) Last BM Date: 12/07/12   General appearance: alert and no distress Resp: clear to auscultation bilaterally Cardio: regular rate and rhythm GI: normal findings: bowel sounds normal and soft, non-tender   Assessment/Plan: Fall  Mildly displaced fxof the L 5-7th rib - pulmonary toilet  Chronic back pain and right hip pain  DVT with Xeralto therapy -- No DVT on repeat study Multiple medical problems-PMR, GERD, Esog. Stricture, hiatal hearnia, crohns, OA bladder, RLS, HTN, HLD, on home meds  Dispo -- To SNF    Lisette Abu, PA-C Pager: [phone] General Trauma PA Pager: [phone]   12/09/2012

## 2012-12-09 NOTE — Clinical Social Work Note (Signed)
Clinical Social Worker facilitated patient discharge including contacting facility and family to confirm discharge plans.  Patient and patient family are agreeable to placement at [facility] and facility currently present to complete paperwork.  Patient family plans to provide transportation to facility.  RN to call report prior to patient discharge.  Clinical Social Worker will sign off for now as social work intervention is no longer needed. Please consult [place] again if new need arises.  Barbette Or, [place]

## 2012-12-10 DIAGNOSIS — N390 Urinary tract infection, site not specified: Secondary | ICD-10-CM | POA: Diagnosis not present

## 2012-12-10 DIAGNOSIS — E119 Type 2 diabetes mellitus without complications: Secondary | ICD-10-CM | POA: Diagnosis not present

## 2012-12-10 DIAGNOSIS — G2581 Restless legs syndrome: Secondary | ICD-10-CM | POA: Diagnosis not present

## 2012-12-10 DIAGNOSIS — G609 Hereditary and idiopathic neuropathy, unspecified: Secondary | ICD-10-CM | POA: Diagnosis not present

## 2012-12-14 ENCOUNTER — Telehealth (INDEPENDENT_AMBULATORY_CARE_PROVIDER_SITE_OTHER): Payer: Self-pay | Admitting: General Surgery

## 2012-12-14 NOTE — Telephone Encounter (Signed)
See note

## 2013-01-03 DIAGNOSIS — M545 Low back pain, unspecified: Secondary | ICD-10-CM | POA: Diagnosis not present

## 2013-01-03 DIAGNOSIS — IMO0002 Reserved for concepts with insufficient information to code with codable children: Secondary | ICD-10-CM | POA: Diagnosis not present

## 2013-01-08 DIAGNOSIS — M353 Polymyalgia rheumatica: Secondary | ICD-10-CM | POA: Diagnosis not present

## 2013-01-08 DIAGNOSIS — K5090 Crohn's disease, unspecified, without complications: Secondary | ICD-10-CM | POA: Diagnosis not present

## 2013-01-08 DIAGNOSIS — I10 Essential (primary) hypertension: Secondary | ICD-10-CM | POA: Diagnosis not present

## 2013-01-08 DIAGNOSIS — E119 Type 2 diabetes mellitus without complications: Secondary | ICD-10-CM | POA: Diagnosis not present

## 2013-01-08 DIAGNOSIS — IMO0001 Reserved for inherently not codable concepts without codable children: Secondary | ICD-10-CM | POA: Diagnosis not present

## 2013-01-10 DIAGNOSIS — M545 Low back pain, unspecified: Secondary | ICD-10-CM | POA: Diagnosis not present

## 2013-01-11 DIAGNOSIS — E119 Type 2 diabetes mellitus without complications: Secondary | ICD-10-CM | POA: Diagnosis not present

## 2013-01-11 DIAGNOSIS — I10 Essential (primary) hypertension: Secondary | ICD-10-CM | POA: Diagnosis not present

## 2013-01-11 DIAGNOSIS — K5090 Crohn's disease, unspecified, without complications: Secondary | ICD-10-CM | POA: Diagnosis not present

## 2013-01-11 DIAGNOSIS — IMO0001 Reserved for inherently not codable concepts without codable children: Secondary | ICD-10-CM | POA: Diagnosis not present

## 2013-01-11 DIAGNOSIS — M353 Polymyalgia rheumatica: Secondary | ICD-10-CM | POA: Diagnosis not present

## 2013-01-13 DIAGNOSIS — K5090 Crohn's disease, unspecified, without complications: Secondary | ICD-10-CM | POA: Diagnosis not present

## 2013-01-13 DIAGNOSIS — M353 Polymyalgia rheumatica: Secondary | ICD-10-CM | POA: Diagnosis not present

## 2013-01-13 DIAGNOSIS — IMO0001 Reserved for inherently not codable concepts without codable children: Secondary | ICD-10-CM | POA: Diagnosis not present

## 2013-01-13 DIAGNOSIS — E119 Type 2 diabetes mellitus without complications: Secondary | ICD-10-CM | POA: Diagnosis not present

## 2013-01-13 DIAGNOSIS — I10 Essential (primary) hypertension: Secondary | ICD-10-CM | POA: Diagnosis not present

## 2013-01-14 DIAGNOSIS — K5090 Crohn's disease, unspecified, without complications: Secondary | ICD-10-CM | POA: Diagnosis not present

## 2013-01-14 DIAGNOSIS — IMO0001 Reserved for inherently not codable concepts without codable children: Secondary | ICD-10-CM | POA: Diagnosis not present

## 2013-01-14 DIAGNOSIS — M353 Polymyalgia rheumatica: Secondary | ICD-10-CM | POA: Diagnosis not present

## 2013-01-14 DIAGNOSIS — I10 Essential (primary) hypertension: Secondary | ICD-10-CM | POA: Diagnosis not present

## 2013-01-14 DIAGNOSIS — E119 Type 2 diabetes mellitus without complications: Secondary | ICD-10-CM | POA: Diagnosis not present

## 2013-01-17 DIAGNOSIS — M353 Polymyalgia rheumatica: Secondary | ICD-10-CM | POA: Diagnosis not present

## 2013-01-17 DIAGNOSIS — IMO0001 Reserved for inherently not codable concepts without codable children: Secondary | ICD-10-CM | POA: Diagnosis not present

## 2013-01-17 DIAGNOSIS — I10 Essential (primary) hypertension: Secondary | ICD-10-CM | POA: Diagnosis not present

## 2013-01-17 DIAGNOSIS — E119 Type 2 diabetes mellitus without complications: Secondary | ICD-10-CM | POA: Diagnosis not present

## 2013-01-17 DIAGNOSIS — K5090 Crohn's disease, unspecified, without complications: Secondary | ICD-10-CM | POA: Diagnosis not present

## 2013-01-18 DIAGNOSIS — I10 Essential (primary) hypertension: Secondary | ICD-10-CM | POA: Diagnosis not present

## 2013-01-18 DIAGNOSIS — K5090 Crohn's disease, unspecified, without complications: Secondary | ICD-10-CM | POA: Diagnosis not present

## 2013-01-18 DIAGNOSIS — IMO0001 Reserved for inherently not codable concepts without codable children: Secondary | ICD-10-CM | POA: Diagnosis not present

## 2013-01-18 DIAGNOSIS — E119 Type 2 diabetes mellitus without complications: Secondary | ICD-10-CM | POA: Diagnosis not present

## 2013-01-18 DIAGNOSIS — M353 Polymyalgia rheumatica: Secondary | ICD-10-CM | POA: Diagnosis not present

## 2013-01-21 DIAGNOSIS — K5090 Crohn's disease, unspecified, without complications: Secondary | ICD-10-CM | POA: Diagnosis not present

## 2013-01-21 DIAGNOSIS — I10 Essential (primary) hypertension: Secondary | ICD-10-CM | POA: Diagnosis not present

## 2013-01-21 DIAGNOSIS — E119 Type 2 diabetes mellitus without complications: Secondary | ICD-10-CM | POA: Diagnosis not present

## 2013-01-21 DIAGNOSIS — IMO0001 Reserved for inherently not codable concepts without codable children: Secondary | ICD-10-CM | POA: Diagnosis not present

## 2013-01-21 DIAGNOSIS — M353 Polymyalgia rheumatica: Secondary | ICD-10-CM | POA: Diagnosis not present

## 2013-01-24 DIAGNOSIS — I10 Essential (primary) hypertension: Secondary | ICD-10-CM | POA: Diagnosis not present

## 2013-01-24 DIAGNOSIS — K5090 Crohn's disease, unspecified, without complications: Secondary | ICD-10-CM | POA: Diagnosis not present

## 2013-01-24 DIAGNOSIS — IMO0001 Reserved for inherently not codable concepts without codable children: Secondary | ICD-10-CM | POA: Diagnosis not present

## 2013-01-24 DIAGNOSIS — M353 Polymyalgia rheumatica: Secondary | ICD-10-CM | POA: Diagnosis not present

## 2013-01-24 DIAGNOSIS — E119 Type 2 diabetes mellitus without complications: Secondary | ICD-10-CM | POA: Diagnosis not present

## 2013-01-25 DIAGNOSIS — I10 Essential (primary) hypertension: Secondary | ICD-10-CM | POA: Diagnosis not present

## 2013-01-25 DIAGNOSIS — K5090 Crohn's disease, unspecified, without complications: Secondary | ICD-10-CM | POA: Diagnosis not present

## 2013-01-25 DIAGNOSIS — M353 Polymyalgia rheumatica: Secondary | ICD-10-CM | POA: Diagnosis not present

## 2013-01-25 DIAGNOSIS — E119 Type 2 diabetes mellitus without complications: Secondary | ICD-10-CM | POA: Diagnosis not present

## 2013-01-25 DIAGNOSIS — IMO0001 Reserved for inherently not codable concepts without codable children: Secondary | ICD-10-CM | POA: Diagnosis not present

## 2013-01-27 DIAGNOSIS — M353 Polymyalgia rheumatica: Secondary | ICD-10-CM | POA: Diagnosis not present

## 2013-01-27 DIAGNOSIS — K5090 Crohn's disease, unspecified, without complications: Secondary | ICD-10-CM | POA: Diagnosis not present

## 2013-01-27 DIAGNOSIS — I10 Essential (primary) hypertension: Secondary | ICD-10-CM | POA: Diagnosis not present

## 2013-01-27 DIAGNOSIS — E119 Type 2 diabetes mellitus without complications: Secondary | ICD-10-CM | POA: Diagnosis not present

## 2013-01-27 DIAGNOSIS — IMO0001 Reserved for inherently not codable concepts without codable children: Secondary | ICD-10-CM | POA: Diagnosis not present

## 2013-01-28 DIAGNOSIS — I10 Essential (primary) hypertension: Secondary | ICD-10-CM | POA: Diagnosis not present

## 2013-01-28 DIAGNOSIS — IMO0001 Reserved for inherently not codable concepts without codable children: Secondary | ICD-10-CM | POA: Diagnosis not present

## 2013-01-28 DIAGNOSIS — E119 Type 2 diabetes mellitus without complications: Secondary | ICD-10-CM | POA: Diagnosis not present

## 2013-01-28 DIAGNOSIS — K5090 Crohn's disease, unspecified, without complications: Secondary | ICD-10-CM | POA: Diagnosis not present

## 2013-01-28 DIAGNOSIS — M353 Polymyalgia rheumatica: Secondary | ICD-10-CM | POA: Diagnosis not present

## 2013-01-31 DIAGNOSIS — I10 Essential (primary) hypertension: Secondary | ICD-10-CM | POA: Diagnosis not present

## 2013-01-31 DIAGNOSIS — M353 Polymyalgia rheumatica: Secondary | ICD-10-CM | POA: Diagnosis not present

## 2013-01-31 DIAGNOSIS — E119 Type 2 diabetes mellitus without complications: Secondary | ICD-10-CM | POA: Diagnosis not present

## 2013-01-31 DIAGNOSIS — IMO0001 Reserved for inherently not codable concepts without codable children: Secondary | ICD-10-CM | POA: Diagnosis not present

## 2013-01-31 DIAGNOSIS — K5090 Crohn's disease, unspecified, without complications: Secondary | ICD-10-CM | POA: Diagnosis not present

## 2013-02-01 DIAGNOSIS — E559 Vitamin D deficiency, unspecified: Secondary | ICD-10-CM | POA: Diagnosis not present

## 2013-02-01 DIAGNOSIS — E538 Deficiency of other specified B group vitamins: Secondary | ICD-10-CM | POA: Diagnosis not present

## 2013-02-01 DIAGNOSIS — I824Z9 Acute embolism and thrombosis of unspecified deep veins of unspecified distal lower extremity: Secondary | ICD-10-CM | POA: Diagnosis not present

## 2013-02-01 DIAGNOSIS — G2589 Other specified extrapyramidal and movement disorders: Secondary | ICD-10-CM | POA: Diagnosis not present

## 2013-02-01 DIAGNOSIS — I10 Essential (primary) hypertension: Secondary | ICD-10-CM | POA: Diagnosis not present

## 2013-02-01 DIAGNOSIS — E119 Type 2 diabetes mellitus without complications: Secondary | ICD-10-CM | POA: Diagnosis not present

## 2013-02-01 DIAGNOSIS — G609 Hereditary and idiopathic neuropathy, unspecified: Secondary | ICD-10-CM | POA: Diagnosis not present

## 2013-02-01 DIAGNOSIS — F3289 Other specified depressive episodes: Secondary | ICD-10-CM | POA: Diagnosis not present

## 2013-02-01 DIAGNOSIS — F329 Major depressive disorder, single episode, unspecified: Secondary | ICD-10-CM | POA: Diagnosis not present

## 2013-02-01 DIAGNOSIS — G894 Chronic pain syndrome: Secondary | ICD-10-CM | POA: Diagnosis not present

## 2013-02-02 DIAGNOSIS — M353 Polymyalgia rheumatica: Secondary | ICD-10-CM | POA: Diagnosis not present

## 2013-02-02 DIAGNOSIS — E119 Type 2 diabetes mellitus without complications: Secondary | ICD-10-CM | POA: Diagnosis not present

## 2013-02-02 DIAGNOSIS — I10 Essential (primary) hypertension: Secondary | ICD-10-CM | POA: Diagnosis not present

## 2013-02-02 DIAGNOSIS — K5090 Crohn's disease, unspecified, without complications: Secondary | ICD-10-CM | POA: Diagnosis not present

## 2013-02-02 DIAGNOSIS — IMO0001 Reserved for inherently not codable concepts without codable children: Secondary | ICD-10-CM | POA: Diagnosis not present

## 2013-02-03 DIAGNOSIS — K5090 Crohn's disease, unspecified, without complications: Secondary | ICD-10-CM | POA: Diagnosis not present

## 2013-02-03 DIAGNOSIS — IMO0001 Reserved for inherently not codable concepts without codable children: Secondary | ICD-10-CM | POA: Diagnosis not present

## 2013-02-03 DIAGNOSIS — M353 Polymyalgia rheumatica: Secondary | ICD-10-CM | POA: Diagnosis not present

## 2013-02-03 DIAGNOSIS — E119 Type 2 diabetes mellitus without complications: Secondary | ICD-10-CM | POA: Diagnosis not present

## 2013-02-03 DIAGNOSIS — I10 Essential (primary) hypertension: Secondary | ICD-10-CM | POA: Diagnosis not present

## 2013-02-07 DIAGNOSIS — M353 Polymyalgia rheumatica: Secondary | ICD-10-CM | POA: Diagnosis not present

## 2013-02-07 DIAGNOSIS — K5090 Crohn's disease, unspecified, without complications: Secondary | ICD-10-CM | POA: Diagnosis not present

## 2013-02-07 DIAGNOSIS — E119 Type 2 diabetes mellitus without complications: Secondary | ICD-10-CM | POA: Diagnosis not present

## 2013-02-07 DIAGNOSIS — IMO0001 Reserved for inherently not codable concepts without codable children: Secondary | ICD-10-CM | POA: Diagnosis not present

## 2013-02-07 DIAGNOSIS — I10 Essential (primary) hypertension: Secondary | ICD-10-CM | POA: Diagnosis not present

## 2013-02-09 DIAGNOSIS — K5090 Crohn's disease, unspecified, without complications: Secondary | ICD-10-CM | POA: Diagnosis not present

## 2013-02-09 DIAGNOSIS — I10 Essential (primary) hypertension: Secondary | ICD-10-CM | POA: Diagnosis not present

## 2013-02-09 DIAGNOSIS — E119 Type 2 diabetes mellitus without complications: Secondary | ICD-10-CM | POA: Diagnosis not present

## 2013-02-09 DIAGNOSIS — IMO0001 Reserved for inherently not codable concepts without codable children: Secondary | ICD-10-CM | POA: Diagnosis not present

## 2013-02-09 DIAGNOSIS — M353 Polymyalgia rheumatica: Secondary | ICD-10-CM | POA: Diagnosis not present

## 2013-02-10 DIAGNOSIS — K5090 Crohn's disease, unspecified, without complications: Secondary | ICD-10-CM | POA: Diagnosis not present

## 2013-02-10 DIAGNOSIS — IMO0001 Reserved for inherently not codable concepts without codable children: Secondary | ICD-10-CM | POA: Diagnosis not present

## 2013-02-10 DIAGNOSIS — I10 Essential (primary) hypertension: Secondary | ICD-10-CM | POA: Diagnosis not present

## 2013-02-10 DIAGNOSIS — M353 Polymyalgia rheumatica: Secondary | ICD-10-CM | POA: Diagnosis not present

## 2013-02-10 DIAGNOSIS — E119 Type 2 diabetes mellitus without complications: Secondary | ICD-10-CM | POA: Diagnosis not present

## 2013-02-14 DIAGNOSIS — K5090 Crohn's disease, unspecified, without complications: Secondary | ICD-10-CM | POA: Diagnosis not present

## 2013-02-14 DIAGNOSIS — E119 Type 2 diabetes mellitus without complications: Secondary | ICD-10-CM | POA: Diagnosis not present

## 2013-02-14 DIAGNOSIS — IMO0001 Reserved for inherently not codable concepts without codable children: Secondary | ICD-10-CM | POA: Diagnosis not present

## 2013-02-14 DIAGNOSIS — M353 Polymyalgia rheumatica: Secondary | ICD-10-CM | POA: Diagnosis not present

## 2013-02-14 DIAGNOSIS — I10 Essential (primary) hypertension: Secondary | ICD-10-CM | POA: Diagnosis not present

## 2013-02-17 ENCOUNTER — Inpatient Hospital Stay (HOSPITAL_COMMUNITY)
Admission: AD | Admit: 2013-02-17 | Discharge: 2013-02-22 | DRG: 176 | Disposition: A | Discharge status: Home Health | Payer: Medicare Other | Source: Ambulatory Visit | Attending: Internal Medicine | Admitting: Internal Medicine

## 2013-02-17 ENCOUNTER — Other Ambulatory Visit: Payer: Self-pay

## 2013-02-17 ENCOUNTER — Encounter (HOSPITAL_COMMUNITY): Payer: Self-pay

## 2013-02-17 ENCOUNTER — Inpatient Hospital Stay (HOSPITAL_COMMUNITY): Payer: Medicare Other

## 2013-02-17 DIAGNOSIS — N390 Urinary tract infection, site not specified: Secondary | ICD-10-CM | POA: Diagnosis not present

## 2013-02-17 DIAGNOSIS — R5381 Other malaise: Secondary | ICD-10-CM | POA: Diagnosis present

## 2013-02-17 DIAGNOSIS — G2581 Restless legs syndrome: Secondary | ICD-10-CM | POA: Diagnosis present

## 2013-02-17 DIAGNOSIS — N318 Other neuromuscular dysfunction of bladder: Secondary | ICD-10-CM | POA: Diagnosis present

## 2013-02-17 DIAGNOSIS — E1142 Type 2 diabetes mellitus with diabetic polyneuropathy: Secondary | ICD-10-CM | POA: Diagnosis present

## 2013-02-17 DIAGNOSIS — G4700 Insomnia, unspecified: Secondary | ICD-10-CM | POA: Diagnosis present

## 2013-02-17 DIAGNOSIS — I498 Other specified cardiac arrhythmias: Secondary | ICD-10-CM | POA: Diagnosis present

## 2013-02-17 DIAGNOSIS — I2699 Other pulmonary embolism without acute cor pulmonale: Secondary | ICD-10-CM | POA: Diagnosis present

## 2013-02-17 DIAGNOSIS — E860 Dehydration: Secondary | ICD-10-CM | POA: Diagnosis present

## 2013-02-17 DIAGNOSIS — R7989 Other specified abnormal findings of blood chemistry: Secondary | ICD-10-CM | POA: Diagnosis not present

## 2013-02-17 DIAGNOSIS — I4510 Unspecified right bundle-branch block: Secondary | ICD-10-CM | POA: Diagnosis present

## 2013-02-17 DIAGNOSIS — Z880 Allergy status to penicillin: Secondary | ICD-10-CM | POA: Diagnosis not present

## 2013-02-17 DIAGNOSIS — N3281 Overactive bladder: Secondary | ICD-10-CM | POA: Diagnosis present

## 2013-02-17 DIAGNOSIS — K219 Gastro-esophageal reflux disease without esophagitis: Secondary | ICD-10-CM | POA: Diagnosis present

## 2013-02-17 DIAGNOSIS — R0609 Other forms of dyspnea: Secondary | ICD-10-CM | POA: Diagnosis not present

## 2013-02-17 DIAGNOSIS — M353 Polymyalgia rheumatica: Secondary | ICD-10-CM | POA: Diagnosis present

## 2013-02-17 DIAGNOSIS — B961 Klebsiella pneumoniae [K. pneumoniae] as the cause of diseases classified elsewhere: Secondary | ICD-10-CM | POA: Diagnosis present

## 2013-02-17 DIAGNOSIS — E785 Hyperlipidemia, unspecified: Secondary | ICD-10-CM | POA: Diagnosis present

## 2013-02-17 DIAGNOSIS — E538 Deficiency of other specified B group vitamins: Secondary | ICD-10-CM | POA: Diagnosis not present

## 2013-02-17 DIAGNOSIS — G894 Chronic pain syndrome: Secondary | ICD-10-CM | POA: Diagnosis not present

## 2013-02-17 DIAGNOSIS — I10 Essential (primary) hypertension: Secondary | ICD-10-CM | POA: Diagnosis not present

## 2013-02-17 DIAGNOSIS — Z79899 Other long term (current) drug therapy: Secondary | ICD-10-CM | POA: Diagnosis not present

## 2013-02-17 DIAGNOSIS — R531 Weakness: Secondary | ICD-10-CM

## 2013-02-17 DIAGNOSIS — K2980 Duodenitis without bleeding: Secondary | ICD-10-CM | POA: Diagnosis present

## 2013-02-17 DIAGNOSIS — W19XXXA Unspecified fall, initial encounter: Secondary | ICD-10-CM

## 2013-02-17 DIAGNOSIS — F3289 Other specified depressive episodes: Secondary | ICD-10-CM | POA: Diagnosis not present

## 2013-02-17 DIAGNOSIS — I82409 Acute embolism and thrombosis of unspecified deep veins of unspecified lower extremity: Secondary | ICD-10-CM | POA: Diagnosis present

## 2013-02-17 DIAGNOSIS — I2789 Other specified pulmonary heart diseases: Secondary | ICD-10-CM | POA: Diagnosis present

## 2013-02-17 DIAGNOSIS — E1149 Type 2 diabetes mellitus with other diabetic neurological complication: Secondary | ICD-10-CM | POA: Diagnosis present

## 2013-02-17 DIAGNOSIS — R799 Abnormal finding of blood chemistry, unspecified: Secondary | ICD-10-CM | POA: Diagnosis present

## 2013-02-17 DIAGNOSIS — R778 Other specified abnormalities of plasma proteins: Secondary | ICD-10-CM

## 2013-02-17 DIAGNOSIS — I369 Nonrheumatic tricuspid valve disorder, unspecified: Secondary | ICD-10-CM | POA: Diagnosis not present

## 2013-02-17 DIAGNOSIS — Z66 Do not resuscitate: Secondary | ICD-10-CM | POA: Diagnosis present

## 2013-02-17 DIAGNOSIS — R0602 Shortness of breath: Secondary | ICD-10-CM

## 2013-02-17 DIAGNOSIS — F329 Major depressive disorder, single episode, unspecified: Secondary | ICD-10-CM | POA: Diagnosis not present

## 2013-02-17 DIAGNOSIS — Z86718 Personal history of other venous thrombosis and embolism: Secondary | ICD-10-CM

## 2013-02-17 DIAGNOSIS — I82403 Acute embolism and thrombosis of unspecified deep veins of lower extremity, bilateral: Secondary | ICD-10-CM

## 2013-02-17 DIAGNOSIS — G609 Hereditary and idiopathic neuropathy, unspecified: Secondary | ICD-10-CM | POA: Diagnosis not present

## 2013-02-17 DIAGNOSIS — R0989 Other specified symptoms and signs involving the circulatory and respiratory systems: Secondary | ICD-10-CM | POA: Diagnosis not present

## 2013-02-17 DIAGNOSIS — E119 Type 2 diabetes mellitus without complications: Secondary | ICD-10-CM | POA: Diagnosis not present

## 2013-02-17 LAB — CBC WITH DIFFERENTIAL/PLATELET
Basophils Absolute: 0 10*3/uL (ref 0.0–0.1)
Basophils Relative: 0 % (ref 0–1)
Eosinophils Absolute: 0 10*3/uL (ref 0.0–0.7)
HCT: 33.6 % — ABNORMAL LOW (ref 36.0–46.0)
Lymphocytes Relative: 27 % (ref 12–46)
MCH: 29.4 pg (ref 26.0–34.0)
MCHC: 33 g/dL (ref 30.0–36.0)
Monocytes Absolute: 0.4 10*3/uL (ref 0.1–1.0)
Monocytes Relative: 7 % (ref 3–12)
Neutro Abs: 4 10*3/uL (ref 1.7–7.7)
Neutrophils Relative %: 66 % (ref 43–77)
Platelets: 175 10*3/uL (ref 150–400)
RDW: 15.9 % — ABNORMAL HIGH (ref 11.5–15.5)
WBC: 6.1 10*3/uL (ref 4.0–10.5)

## 2013-02-17 LAB — COMPREHENSIVE METABOLIC PANEL
ALT: 15 U/L (ref 0–35)
Albumin: 3.2 g/dL — ABNORMAL LOW (ref 3.5–5.2)
Alkaline Phosphatase: 103 U/L (ref 39–117)
BUN: 25 mg/dL — ABNORMAL HIGH (ref 6–23)
CO2: 27 mEq/L (ref 19–32)
Chloride: 103 mEq/L (ref 96–112)
Creatinine, Ser: 1.01 mg/dL (ref 0.50–1.10)
GFR calc Af Amer: 55 mL/min — ABNORMAL LOW (ref >90)
GFR calc non Af Amer: 47 mL/min — ABNORMAL LOW (ref >90)
Glucose, Bld: 104 mg/dL — ABNORMAL HIGH (ref 70–99)
Potassium: 3.9 mEq/L (ref 3.5–5.1)
Total Bilirubin: 0.4 mg/dL (ref 0.3–1.2)

## 2013-02-17 LAB — APTT: aPTT: 29 seconds (ref 24–37)

## 2013-02-17 LAB — URINALYSIS, ROUTINE W REFLEX MICROSCOPIC
Bilirubin Urine: NEGATIVE
Glucose, UA: NEGATIVE mg/dL
Specific Gravity, Urine: 1.028 (ref 1.005–1.030)
Urobilinogen, UA: 0.2 mg/dL (ref 0.0–1.0)

## 2013-02-17 LAB — TROPONIN I
Troponin I: 1.08 ng/mL (ref <0.30)
Troponin I: 0.69 ng/mL (ref <0.30)

## 2013-02-17 LAB — D-DIMER, QUANTITATIVE: D-Dimer, Quant: 7.23 ug/mL-FEU — ABNORMAL HIGH (ref 0.00–0.48)

## 2013-02-17 LAB — URINE MICROSCOPIC-ADD ON

## 2013-02-17 LAB — MAGNESIUM: Magnesium: 1.5 mg/dL (ref 1.5–2.5)

## 2013-02-17 LAB — PROTIME-INR: INR: 1.06 (ref 0.00–1.49)

## 2013-02-17 MED ORDER — BISACODYL 5 MG PO TBEC: 5.0000 mg | DELAYED_RELEASE_TABLET | Freq: Every day | ORAL | Status: DC | PRN

## 2013-02-17 MED ORDER — ROPINIROLE HCL ER 8 MG PO TB24: 6.0000 mg | ORAL_TABLET | Freq: Every day | ORAL | Status: DC

## 2013-02-17 MED ORDER — ROPINIROLE HCL ER 6 MG PO TB24: 1.0000 | ORAL_TABLET | Freq: Every evening | ORAL | Status: DC

## 2013-02-17 MED ORDER — ALUM & MAG HYDROXIDE-SIMETH 200-200-20 MG/5ML PO SUSP: 30.0000 mL | Freq: Four times a day (QID) | ORAL | Status: DC | PRN

## 2013-02-17 MED ORDER — ONDANSETRON HCL 4 MG PO TABS: 4.0000 mg | ORAL_TABLET | Freq: Four times a day (QID) | ORAL | Status: DC | PRN

## 2013-02-17 MED ORDER — ENOXAPARIN SODIUM 40 MG/0.4ML ~~LOC~~ SOLN
40.0000 mg | SUBCUTANEOUS | Status: DC
  Filled 2013-02-17: qty 0.4

## 2013-02-17 MED ORDER — MIRTAZAPINE 15 MG PO TABS
15.0000 mg | ORAL_TABLET | Freq: Every day | ORAL | Status: DC
  Administered 2013-02-17 – 2013-02-21 (×4): 15 mg via ORAL
  Filled 2013-02-17 (×7): qty 1

## 2013-02-17 MED ORDER — ENOXAPARIN SODIUM 30 MG/0.3ML ~~LOC~~ SOLN
30.0000 mg | SUBCUTANEOUS | Status: DC
  Administered 2013-02-17: 30 mg via SUBCUTANEOUS
  Filled 2013-02-17: qty 0.3

## 2013-02-17 MED ORDER — PANTOPRAZOLE SODIUM 40 MG PO TBEC
40.0000 mg | DELAYED_RELEASE_TABLET | Freq: Every day | ORAL | Status: DC
  Administered 2013-02-17 – 2013-02-22 (×6): 40 mg via ORAL
  Filled 2013-02-17 (×7): qty 1

## 2013-02-17 MED ORDER — ONDANSETRON HCL 4 MG/2ML IJ SOLN: 4.0000 mg | Freq: Four times a day (QID) | INTRAMUSCULAR | Status: DC | PRN

## 2013-02-17 MED ORDER — GABAPENTIN 300 MG PO CAPS: 300.0000 mg | ORAL_CAPSULE | Freq: Two times a day (BID) | ORAL | Status: DC

## 2013-02-17 MED ORDER — SODIUM CHLORIDE 0.9 % IV SOLN
INTRAVENOUS | Status: DC
  Administered 2013-02-17 – 2013-02-19 (×5): via INTRAVENOUS

## 2013-02-17 MED ORDER — METOPROLOL TARTRATE 12.5 MG HALF TABLET
12.5000 mg | ORAL_TABLET | Freq: Two times a day (BID) | ORAL | Status: DC
  Administered 2013-02-17 – 2013-02-19 (×4): 12.5 mg via ORAL
  Filled 2013-02-17 (×7): qty 1

## 2013-02-17 MED ORDER — INSULIN ASPART 100 UNIT/ML ~~LOC~~ SOLN
0.0000 [IU] | Freq: Three times a day (TID) | SUBCUTANEOUS | Status: DC
  Administered 2013-02-20 – 2013-02-21 (×2): 1 [IU] via SUBCUTANEOUS

## 2013-02-17 MED ORDER — ACETAMINOPHEN 650 MG RE SUPP: 650.0000 mg | Freq: Four times a day (QID) | RECTAL | Status: DC | PRN

## 2013-02-17 MED ORDER — ROPINIROLE HCL ER 2 MG PO TB24
6.0000 mg | ORAL_TABLET | Freq: Every day | ORAL | Status: DC
  Administered 2013-02-17 – 2013-02-21 (×5): 6 mg via ORAL
  Filled 2013-02-17 (×6): qty 3

## 2013-02-17 MED ORDER — ASPIRIN 81 MG PO CHEW
81.0000 mg | CHEWABLE_TABLET | Freq: Every day | ORAL | Status: DC
  Administered 2013-02-18: 81 mg via ORAL
  Filled 2013-02-17: qty 1

## 2013-02-17 MED ORDER — MAGNESIUM SULFATE 50 % IJ SOLN
3.0000 g | Freq: Once | INTRAVENOUS | Status: AC
  Administered 2013-02-17: 3 g via INTRAVENOUS
  Filled 2013-02-17: qty 6

## 2013-02-17 MED ORDER — MORPHINE SULFATE 2 MG/ML IJ SOLN: 1.0000 mg | INTRAMUSCULAR | Status: DC | PRN

## 2013-02-17 MED ORDER — DOCUSATE SODIUM 100 MG PO CAPS
100.0000 mg | ORAL_CAPSULE | Freq: Two times a day (BID) | ORAL | Status: DC
  Administered 2013-02-17 – 2013-02-22 (×7): 100 mg via ORAL
  Filled 2013-02-17 (×11): qty 1

## 2013-02-17 MED ORDER — OXYCODONE HCL 5 MG PO TABS
5.0000 mg | ORAL_TABLET | Freq: Four times a day (QID) | ORAL | Status: DC | PRN
  Administered 2013-02-20 – 2013-02-21 (×3): 5 mg via ORAL
  Filled 2013-02-17: qty 2
  Filled 2013-02-17: qty 1

## 2013-02-17 MED ORDER — GABAPENTIN 100 MG PO CAPS
100.0000 mg | ORAL_CAPSULE | Freq: Three times a day (TID) | ORAL | Status: DC
  Administered 2013-02-17 – 2013-02-22 (×15): 100 mg via ORAL
  Filled 2013-02-17 (×17): qty 1

## 2013-02-17 MED ORDER — FLEET ENEMA 7-19 GM/118ML RE ENEM: 1.0000 | ENEMA | Freq: Once | RECTAL | Status: AC | PRN

## 2013-02-17 MED ORDER — SODIUM CHLORIDE 0.9 % IV BOLUS (SEPSIS)
1000.0000 mL | Freq: Once | INTRAVENOUS | Status: AC
  Administered 2013-02-17: 1000 mL via INTRAVENOUS

## 2013-02-17 MED ORDER — SODIUM CHLORIDE 0.9 % IJ SOLN
3.0000 mL | Freq: Two times a day (BID) | INTRAMUSCULAR | Status: DC
  Administered 2013-02-18 – 2013-02-22 (×5): 3 mL via INTRAVENOUS

## 2013-02-17 MED ORDER — POLYETHYLENE GLYCOL 3350 17 G PO PACK
17.0000 g | PACK | Freq: Every day | ORAL | Status: DC | PRN
  Filled 2013-02-17: qty 1

## 2013-02-17 MED ORDER — ASPIRIN 81 MG PO CHEW
324.0000 mg | CHEWABLE_TABLET | Freq: Once | ORAL | Status: AC
  Administered 2013-02-17: 324 mg via ORAL
  Filled 2013-02-17: qty 4

## 2013-02-17 MED ORDER — CIPROFLOXACIN IN D5W 400 MG/200ML IV SOLN
400.0000 mg | Freq: Two times a day (BID) | INTRAVENOUS | Status: DC
  Administered 2013-02-17 – 2013-02-18 (×2): 400 mg via INTRAVENOUS
  Filled 2013-02-17 (×3): qty 200

## 2013-02-17 MED ORDER — ACETAMINOPHEN 325 MG PO TABS
650.0000 mg | ORAL_TABLET | Freq: Four times a day (QID) | ORAL | Status: DC | PRN
  Administered 2013-02-20: 650 mg via ORAL
  Filled 2013-02-17: qty 2

## 2013-02-17 NOTE — H&P (Signed)
[facility] History and Physical  [name] [id number] DOB: [birth year] DOA: 02/17/2013  Referring physician: Dr Vista Lawman PCP: Benito Mccreedy, MD  Specialists:   Chief Complaint: SOB/WEAKNESS/FATIQUE  HPI: [name] is a 79 y.o. female with past medical history of diabetes type 2, hypertension, or for neuropathy, prior history of DVT currently off anticoagulations who had presented to her PCPs office with a one-day history of worsening shortness of breath, generalized weakness to the point where she was unable to get out of bed and stand, an episode of emesis the night prior to admission. Patient denied any fevers, no chills, no chest pain, no nausea, no abdominal pain, no dysuria, no diarrhea, no constipation, no melena, no hematemesis, no hematochezia. Per caretaker patient had some labored breathing this morning. Patient presented to PCPs office where labs were obtained including urinalysis was worsened 4 UTI. Per patient's PCP EKG which was done did show a new right bundle branch block and a left anterior fascicular block which was new with a sinus tachycardia. Patient was sent to the hospital as a direct admission.  Review of Systems: The patient denies anorexia, fever, weight loss,, vision loss, decreased hearing, hoarseness, chest pain, syncope, dyspnea on exertion, peripheral edema, balance deficits, hemoptysis, abdominal pain, melena, hematochezia, severe indigestion/heartburn, hematuria, incontinence, genital sores, muscle weakness, suspicious skin lesions, transient blindness, difficulty walking, depression, unusual weight change, abnormal bleeding, enlarged lymph nodes, angioedema, and breast masses.   Past Medical History  Diagnosis Date  . Cyst and pseudocyst of pancreas   . Unspecified essential hypertension   . Other and unspecified hyperlipidemia   . Polymyalgia rheumatica   . Personal history of unspecified digestive disease   . Restless legs syndrome  (RLS)   . Insomnia, unspecified   . Other malaise and fatigue   . Diverticulosis of colon (without mention of hemorrhage)   . Acute gastritis without mention of hemorrhage   . Duodenitis without mention of hemorrhage   . Diaphragmatic hernia without mention of obstruction or gangrene   . Esophageal reflux   . Stricture and stenosis of esophagus   . OAB (overactive bladder)   . Left leg DVT jan 2013  . Unspecified sleep apnea     hx of sleep apnea, none since weight loss  . Diabetes mellitus    Past Surgical History  Procedure Laterality Date  . Oophorectomy  1944    not sure which ovary  . Rotator cuff repair  right     4-5 yrs ago  . Eye surgery      cataracts removed-bilateral eyes  . Back surgery  2009    lower back  . Colon surgery  June 17, 2012    surgery for bleed  . Shoulder open rotator cuff repair  08/04/2012    Procedure: ROTATOR CUFF REPAIR SHOULDER OPEN;  Surgeon: Tobi Bastos, MD;  Location: [facility];  Service: Orthopedics;  Laterality: Left;  with Anchors and Graft   Social History:  reports that she has never smoked. She has never used smokeless tobacco. She reports that she does not drink alcohol or use illicit drugs.   Allergies  Allergen Reactions  . Sulfa Antibiotics Nausea And Vomiting  . Penicillins Hives and Rash    Family History  Problem Relation Age of Onset  . Melanoma Daughter     died age 76  . Melanoma Son     Prior to Admission medications   Medication Sig Start Date End Date Taking? Authorizing Provider  ergocalciferol (VITAMIN D2) 50000 UNITS capsule Take 50,000 Units by mouth 2 (two) times a week. Take twice weekly on Wednesday and Sunday    Historical Provider, MD  gabapentin (NEURONTIN) 100 MG capsule Take 100 mg by mouth at bedtime.    Historical Provider, MD  gabapentin (NEURONTIN) 300 MG capsule Take 300 mg by mouth 2 (two) times daily.    Historical Provider, MD  l-methylfolate-B6-B12 (METANX) 3-35-2 MG TABS Take 1 tablet by  mouth 2 (two) times daily.    Historical Provider, MD  metFORMIN (GLUCOPHAGE) 500 MG tablet Take 250 mg by mouth 2 (two) times daily with a meal.     Historical Provider, MD  mirtazapine (REMERON) 15 MG tablet Take 15 mg by mouth at bedtime.    Historical Provider, MD  oxyCODONE (OXY IR/ROXICODONE) 5 MG immediate release tablet Take 1-2 tablets (5-10 mg total) by mouth every 6 (six) hours as needed (1 tablet for mild pain, 2 tablets for moderate to severe pain). 12/07/12   Megan Dort, PA-C  Ropinirole HCl (REQUIP XL) 6 MG TB24 Take 1 tablet by mouth every evening.    Historical Provider, MD   Physical Exam: Filed Vitals:   02/17/13 1345  BP: 136/84  Temp: 97.8 F (36.6 C)  Height: 5' (1.524 m)  Weight: 57.153 kg (126 lb)     General:  Well-developed well-nourished in no cardio pulmonary distress.  Eyes: Pupils equal round and reactive to light and accommodation. Extraocular movements intact.  ENT: Oropharynx is clear, no lesions, no exudates. Dry mucous membranes.  Neck: Supple with no lymphadenopathy. No JVD.  Cardiovascular: Regular rate rhythm no murmurs rubs or gallops.  Respiratory: Clear to auscultation bilaterally. No wheezes, no crackles, no rhonchi.  Abdomen: Soft, nontender, nondistended, positive bowel sounds.  Skin: No rashes or lesions noted.  Musculoskeletal: 5 out of 5 bilateral upper extremity strength. 5 out of 5 bilateral lower extremity strength.  Psychiatric: Normal mood. Normal affect. Fair insight. Fair judgment.  Neurologic: Alert and oriented x3. Cranial nerves II through XII are grossly intact. Sensation is intact. No focal deficits. Gait not tested secondary to safety.  Labs on Admission:  Basic Metabolic Panel: No results found for this basename: NA, K, CL, CO2, GLUCOSE, BUN, CREATININE, CALCIUM, MG, PHOS,  in the last 168 hours Liver Function Tests: No results found for this basename: AST, ALT, ALKPHOS, BILITOT, PROT, ALBUMIN,  in the last 168  hours No results found for this basename: LIPASE, AMYLASE,  in the last 168 hours No results found for this basename: AMMONIA,  in the last 168 hours CBC: No results found for this basename: WBC, NEUTROABS, HGB, HCT, MCV, PLT,  in the last 168 hours Cardiac Enzymes: No results found for this basename: CKTOTAL, CKMB, CKMBINDEX, TROPONINI,  in the last 168 hours  BNP (last 3 results) No results found for this basename: PROBNP,  in the last 8760 hours CBG: No results found for this basename: GLUCAP,  in the last 168 hours  Radiological Exams on Admission: No results found.  EKG: Per PCP's office notes patient with a sinus tachycardia with a heart rate of 105 and a complete right bundle branch block and a left anterior fascicular block which is new  Assessment/Plan Principal Problem:   Weakness generalized Active Problems:   HYPERLIPIDEMIA   RESTLESS LEG SYNDROME   HYPERTENSION   GERD   DUODENITIS   Polymyalgia rheumatica   FATIGUE   CROHN'S DISEASE, HX OF   Overactive bladder  Fall   UTI (urinary tract infection)   SOB (shortness of breath)   #1 generalized weakness Questionable etiology. Differential includes cardiac versus infectious etiology secondary to urinary tract infection versus dehydration/volume depletion. We'll cycle cardiac enzymes every 6 hours x3. Will check a 2-D echo. Will check a d-dimer. Will check a comprehensive metabolic profile. Check a CBC with differential. Check a TSH. Check a chest x-ray. Check a UA with cultures and sensitivities. Will place empirically on IV ciprofloxacin for urinary tract infection. We'll place on IV fluids. Supportive care.  #2 shortness of breath Questionable etiology. Differential includes cardiac versus infectious etiology versus pulmonary etiology such as a PE in patient with a history of DVT currently not on anticoagulations with a new right bundle branch block on EKG. We'll cycle cardiac enzymes every 6 hours x3. Check a  d-dimer. Check a UA with cultures and sensitivities. Check a chest x-ray. Repeat EKG. Gentle hydration. Check a 2-D echo. Follow. If cardiac enzymes are positive we'll consult with cardiology for further evaluation and management.  #3 urinary tract infection We'll repeat a UA with cultures and sensitivities. Will place patient empirically on IV ciprofloxacin and follow.  #4 dehydration IV fluids.  #5 diabetes mellitus Patient with recent hemoglobin A1c of 7.3 on 02/01/2013 done per notes from PCPs office. Will hold patient's oral hypoglycemic agents. Place on a sliding scale insulin.  #6 new EKG changes Her PCPs office notes patient with a new right bundle branch block in the left anterior fascicular block. Will repeat EKG. Cycle cardiac enzymes every 6 hours x3. Check a 2-D echo. We'll consult with cardiology for further evaluation and management.  #7 restless legs syndrome Continue home regimen of frequent.  #8 depression Stable.  #9 chronic pain Continue home regimen of oxycodone.  #10 prophylaxis Protonix for GI prophylaxis. Lovenox for DVT prophylaxis.    Code Status: DNR Family Communication: Updated patient and care giver at bedside Disposition Plan: ADMIT TO TELEMETRY  Time spent: [facility] Pager [phone]  If 7PM-7AM, please contact night-coverage [email or web address] Password [facility] 02/17/2013, 3:47 PM

## 2013-02-17 NOTE — Progress Notes (Signed)
CRITICAL VALUE ALERT  Critical value received: Troponin 1.08   Date of notification: 02/17/13  Time of notification:  1700  Critical value read back:yes  Nurse who received alert:  Raylene Miyamoto RN  MD notified (1st page):  D Thompson Time of first page:  74  MD notified (2nd page):  Time of second page:  Responding MD: Maggie Font  Time MD responded:  [phone]

## 2013-02-17 NOTE — Consult Note (Signed)
Reason for Consult: Elevated troponin Referring Physician: Dr. Grandville Silos Primary cardiologist: None  [name] is an 79 y.o. female.  HPI: This is a very delightful 79 year old woman without any past history of known coronary artery disease or hypertension.  She was admitted today after a one-day history of unexplained profound weakness to the point that she was unable to get out of bed yesterday.  When she awoke today feeling no better than yesterday she went to her PCPs office where an EKG was done which showed a new right bundle branch block with left anterior fascicular block and she was sent to the hospital as a direct admission.  He was in the hospital she has a slight elevation of troponin at 1.08.  The patient denies any recent chest discomfort.  She was more short of breath yesterday and at this morning but not at the present time.  She is a adult onset diabetes and is on metformin.  She denies any history of hypercholesterolemia or essential hypertension.  She has had a prior GI history of needing periodic esophageal dilatations by Dr. Henrene Pastor.  She does not have any past history of GI bleeding.  She has a remote history of left leg deep vein thrombosis in January 2013 and was on Coumadin for a period of months but not at the present time.  She has not had any pleurisy or hemoptysis or symptoms of pulmonary embolus to explain her present symptoms. Normally the patient is active despite her advanced age.  She is able to go shopping and she lives by herself in a condominium.  Past Medical History  Diagnosis Date  . Cyst and pseudocyst of pancreas   . Unspecified essential hypertension   . Other and unspecified hyperlipidemia   . Polymyalgia rheumatica   . Personal history of unspecified digestive disease   . Restless legs syndrome (RLS)   . Insomnia, unspecified   . Other malaise and fatigue   . Diverticulosis of colon (without mention of hemorrhage)   . Acute gastritis without  mention of hemorrhage   . Duodenitis without mention of hemorrhage   . Diaphragmatic hernia without mention of obstruction or gangrene   . Esophageal reflux   . Stricture and stenosis of esophagus   . OAB (overactive bladder)   . Left leg DVT jan 2013  . Unspecified sleep apnea     hx of sleep apnea, none since weight loss  . Diabetes mellitus     Past Surgical History  Procedure Laterality Date  . Oophorectomy  1944    not sure which ovary  . Rotator cuff repair  right     4-5 yrs ago  . Eye surgery      cataracts removed-bilateral eyes  . Back surgery  2009    lower back  . Colon surgery  June 17, 2012    surgery for bleed  . Shoulder open rotator cuff repair  08/04/2012    Procedure: ROTATOR CUFF REPAIR SHOULDER OPEN;  Surgeon: Tobi Bastos, MD;  Location: [facility];  Service: Orthopedics;  Laterality: Left;  with Anchors and Graft    Family History  Problem Relation Age of Onset  . Melanoma Daughter     died age 15  . Melanoma Son     Social History:  reports that she has never smoked. She has never used smokeless tobacco. She reports that she does not drink alcohol or use illicit drugs.  Allergies:  Allergies  Allergen Reactions  .  Sulfa Antibiotics Nausea And Vomiting  . Penicillins Hives and Rash    Medications:  Scheduled: . ciprofloxacin  400 mg Intravenous Q12H  . docusate sodium  100 mg Oral BID  . enoxaparin (LOVENOX) injection  30 mg Subcutaneous Q24H  . gabapentin  100 mg Oral TID  . [START ON 02/18/2013] insulin aspart  0-9 Units Subcutaneous TID WC  . magnesium sulfate 1 - 4 g bolus IVPB  3 g Intravenous Once  . metoprolol tartrate  12.5 mg Oral BID  . mirtazapine  15 mg Oral QHS  . pantoprazole  40 mg Oral Daily  . rOPINIRole  6 mg Oral QHS  . sodium chloride  3 mL Intravenous Q12H    Results for orders placed during the hospital encounter of 02/17/13 (from the past 48 hour(s))  COMPREHENSIVE METABOLIC PANEL     Status: Abnormal    Collection Time    02/17/13  4:03 PM      Result Value Range   Sodium 141  135 - 145 mEq/L   Potassium 3.9  3.5 - 5.1 mEq/L   Chloride 103  96 - 112 mEq/L   CO2 27  19 - 32 mEq/L   Glucose, Bld 104 (*) 70 - 99 mg/dL   BUN 25 (*) 6 - 23 mg/dL   Creatinine, Ser 1.01  0.50 - 1.10 mg/dL   Calcium 9.2  8.4 - 10.5 mg/dL   Total Protein 6.1  6.0 - 8.3 g/dL   Albumin 3.2 (*) 3.5 - 5.2 g/dL   AST 24  0 - 37 U/L   ALT 15  0 - 35 U/L   Alkaline Phosphatase 103  39 - 117 U/L   Total Bilirubin 0.4  0.3 - 1.2 mg/dL   GFR calc non Af Amer 47 (*) >90 mL/min   GFR calc Af Amer 55 (*) >90 mL/min   Comment:            The eGFR has been calculated     using the CKD EPI equation.     This calculation has not been     validated in all clinical     situations.     eGFR's persistently     <90 mL/min signify     possible Chronic Kidney Disease.  MAGNESIUM     Status: None   Collection Time    02/17/13  4:03 PM      Result Value Range   Magnesium 1.5  1.5 - 2.5 mg/dL  CBC WITH DIFFERENTIAL     Status: Abnormal   Collection Time    02/17/13  4:03 PM      Result Value Range   WBC 6.1  4.0 - 10.5 K/uL   RBC 3.77 (*) 3.87 - 5.11 MIL/uL   Hemoglobin 11.1 (*) 12.0 - 15.0 g/dL   HCT 33.6 (*) 36.0 - 46.0 %   MCV 89.1  78.0 - 100.0 fL   MCH 29.4  26.0 - 34.0 pg   MCHC 33.0  30.0 - 36.0 g/dL   RDW 15.9 (*) 11.5 - 15.5 %   Platelets 175  150 - 400 K/uL   Neutrophils Relative 66  43 - 77 %   Neutro Abs 4.0  1.7 - 7.7 K/uL   Lymphocytes Relative 27  12 - 46 %   Lymphs Abs 1.6  0.7 - 4.0 K/uL   Monocytes Relative 7  3 - 12 %   Monocytes Absolute 0.4  0.1 - 1.0 K/uL  Eosinophils Relative 1  0 - 5 %   Eosinophils Absolute 0.0  0.0 - 0.7 K/uL   Basophils Relative 0  0 - 1 %   Basophils Absolute 0.0  0.0 - 0.1 K/uL  APTT     Status: None   Collection Time    02/17/13  4:03 PM      Result Value Range   aPTT 29  24 - 37 seconds  PROTIME-INR     Status: None   Collection Time    02/17/13  4:03 PM       Result Value Range   Prothrombin Time 13.7  11.6 - 15.2 seconds   INR 1.06  0.00 - 1.49  TROPONIN I     Status: Abnormal   Collection Time    02/17/13  4:03 PM      Result Value Range   Troponin I 1.08 (*) <0.30 ng/mL   Comment:            Due to the release kinetics of cTnI,     a negative result within the first hours     of the onset of symptoms does not rule out     myocardial infarction with certainty.     If myocardial infarction is still suspected,     repeat the test at appropriate intervals.     CRITICAL RESULT CALLED TO, READ BACK BY AND VERIFIED WITH:     [id number]    Dg Chest Port 1 View  02/17/2013  *RADIOLOGY REPORT*  Clinical Data: Shortness of breath and weakness.  PORTABLE CHEST - 1 VIEW  Comparison: Chest x-ray 12/06/2012.  Findings: Lung volumes are normal.  No consolidative airspace disease.  No pleural effusions.  No evidence of pulmonary edema. Heart size is borderline to mildly enlarged (unchanged).  Moderate to large hiatal hernia. The patient is rotated to the right on today's exam, resulting in distortion of the mediastinal contours and reduced diagnostic sensitivity and specificity for mediastinal pathology.  Atherosclerosis in the thoracic aorta.  IMPRESSION: 1.  No radiographic evidence of acute cardiopulmonary disease. Allowing for slight differences in patient positioning, the radiographic appearance of the chest is essentially unchanged, as above.   Original Report Authenticated By: Vinnie Langton, M.D.     Review of systems reveals no cough or sputum production fever or chills.  She is currently being treated for a suspected urinary tract infection. Blood pressure 136/84, pulse 87, temperature 97.8 F (36.6 C), resp. rate 16, height 5' (1.524 m), weight 126 lb (57.153 kg), SpO2 93.00%. The patient appears to be in no distress.  She is flat.  She is not dyspneic.  She has nasal oxygen at 2 L a minute  Head and neck exam reveals that  the pupils are equal and reactive.  The extraocular movements are full.  There is no scleral icterus.  Mouth and pharynx are benign.  No lymphadenopathy.  No carotid bruits.  The jugular venous pressure is normal.  Thyroid is not enlarged or tender.  Chest is clear to percussion and auscultation.  No rales or rhonchi.  Expansion of the chest is symmetrical.  Heart reveals no abnormal lift or heave.  First and second heart sounds are normal.  There is no murmur gallop rub or click.  The abdomen is soft and nontender.  Bowel sounds are normoactive.  There is no hepatosplenomegaly or mass.  There are no abdominal bruits.  Extremities reveal no phlebitis.  There is mild chronic bilateral pretibial edema.  Pedal pulses are good.  There is no cyanosis or clubbing.  Neurologic exam is normal strength and no lateralizing weakness.  No sensory deficits.  Integument reveals no rash  EKG was reviewed and shows normal sinus rhythm with bifascicular block but no definite acute ST-T wave abnormalities.  There is a question of slight ST segment elevation in leads 1 and aVL.  We will get a followup EKG in a.m.  Assessment/Plan: 1. possible non-STEMI presenting without chest pain but with weakness and dyspnea.  Her symptoms began yesterday morning.  We will get serial cardiac enzymes to determine trend.  We will get serial EKGs. At this point we will pursue a noninvasive workup and obtain echocardiogram tomorrow to look at left ventricular wall motion.  The patient has already been placed on beta blocker and we will add a baby aspirin daily.  If blood pressure remains stable would also consider subsequent addition of a low-dose ACE inhibitor. 2.  Past history of DVT of left leg in January 2013.  Her present symptoms are not highly suggestive of recurrent DVT about obtaining a d-dimer will be worthwhile and if positive would pursue CT angiogram of the chest. 3.  mild chronic anemia 4. type 2 diabetes mellitus with  peripheral neuropathy. 5. DNR status  We will follow with you  Darlin Coco 02/17/2013, 6:38 PM

## 2013-02-17 NOTE — Progress Notes (Signed)
Hospitalist repaged for admission orders.

## 2013-02-17 NOTE — Progress Notes (Signed)
Page to Dr. Manfred Arch and informed that [name] is in the room and not feeling well. [name] direct admit form [facility], states she starting feeling bad yesterday, weak. Seen in [facility] office today, Rx from MD states [name] has fatigue, SOB and UTI. Placed on O2 as saturations 88-90?, hands cold, will reassess. PIV started with KVO fluids. Awaiting orders, [name] obviously feels distressed.

## 2013-02-18 ENCOUNTER — Encounter (HOSPITAL_COMMUNITY): Payer: Self-pay

## 2013-02-18 ENCOUNTER — Inpatient Hospital Stay (HOSPITAL_COMMUNITY): Payer: Medicare Other

## 2013-02-18 DIAGNOSIS — I2699 Other pulmonary embolism without acute cor pulmonale: Secondary | ICD-10-CM | POA: Diagnosis present

## 2013-02-18 DIAGNOSIS — I369 Nonrheumatic tricuspid valve disorder, unspecified: Secondary | ICD-10-CM

## 2013-02-18 DIAGNOSIS — I82403 Acute embolism and thrombosis of unspecified deep veins of lower extremity, bilateral: Secondary | ICD-10-CM | POA: Diagnosis present

## 2013-02-18 DIAGNOSIS — I82409 Acute embolism and thrombosis of unspecified deep veins of unspecified lower extremity: Secondary | ICD-10-CM

## 2013-02-18 DIAGNOSIS — R7989 Other specified abnormal findings of blood chemistry: Secondary | ICD-10-CM | POA: Diagnosis not present

## 2013-02-18 DIAGNOSIS — R5381 Other malaise: Secondary | ICD-10-CM | POA: Diagnosis not present

## 2013-02-18 DIAGNOSIS — N390 Urinary tract infection, site not specified: Secondary | ICD-10-CM | POA: Diagnosis not present

## 2013-02-18 LAB — CBC
HCT: 30.6 % — ABNORMAL LOW (ref 36.0–46.0)
Hemoglobin: 9.6 g/dL — ABNORMAL LOW (ref 12.0–15.0)
MCH: 28.2 pg (ref 26.0–34.0)
MCHC: 31.4 g/dL (ref 30.0–36.0)
MCV: 90 fL (ref 78.0–100.0)
RDW: 15.8 % — ABNORMAL HIGH (ref 11.5–15.5)

## 2013-02-18 LAB — BASIC METABOLIC PANEL
BUN: 22 mg/dL (ref 6–23)
CO2: 25 mEq/L (ref 19–32)
Chloride: 105 mEq/L (ref 96–112)
Creatinine, Ser: 0.92 mg/dL (ref 0.50–1.10)
Glucose, Bld: 88 mg/dL (ref 70–99)
Potassium: 3.8 mEq/L (ref 3.5–5.1)

## 2013-02-18 LAB — LIPID PANEL
HDL: 47 mg/dL (ref >39)
Total CHOL/HDL Ratio: 2.8 RATIO
VLDL: 16 mg/dL (ref 0–40)

## 2013-02-18 LAB — HEPARIN LEVEL (UNFRACTIONATED): Heparin Unfractionated: 1.04 IU/mL — ABNORMAL HIGH (ref 0.30–0.70)

## 2013-02-18 LAB — TROPONIN I: Troponin I: 0.47 ng/mL (ref <0.30)

## 2013-02-18 LAB — GLUCOSE, CAPILLARY: Glucose-Capillary: 109 mg/dL — ABNORMAL HIGH (ref 70–99)

## 2013-02-18 MED ORDER — HEPARIN (PORCINE) IN NACL 100-0.45 UNIT/ML-% IJ SOLN
550.0000 [IU]/h | INTRAMUSCULAR | Status: DC
  Filled 2013-02-18: qty 250

## 2013-02-18 MED ORDER — ENSURE PUDDING PO PUDG
1.0000 | Freq: Two times a day (BID) | ORAL | Status: DC
  Administered 2013-02-19 – 2013-02-22 (×6): 1 via ORAL
  Filled 2013-02-18 (×10): qty 1

## 2013-02-18 MED ORDER — HEPARIN BOLUS VIA INFUSION
2000.0000 [IU] | Freq: Once | INTRAVENOUS | Status: AC
  Administered 2013-02-18: 2000 [IU] via INTRAVENOUS
  Filled 2013-02-18: qty 2000

## 2013-02-18 MED ORDER — HEPARIN (PORCINE) IN NACL 100-0.45 UNIT/ML-% IJ SOLN
500.0000 [IU]/h | INTRAMUSCULAR | Status: DC
  Filled 2013-02-18: qty 250

## 2013-02-18 MED ORDER — IOHEXOL 350 MG/ML SOLN
100.0000 mL | Freq: Once | INTRAVENOUS | Status: AC | PRN
  Administered 2013-02-18: 100 mL via INTRAVENOUS

## 2013-02-18 MED ORDER — HEPARIN (PORCINE) IN NACL 100-0.45 UNIT/ML-% IJ SOLN
800.0000 [IU]/h | INTRAMUSCULAR | Status: DC
  Administered 2013-02-18: 800 [IU]/h via INTRAVENOUS
  Filled 2013-02-18: qty 250

## 2013-02-18 MED ORDER — CIPROFLOXACIN HCL 500 MG PO TABS
500.0000 mg | ORAL_TABLET | Freq: Two times a day (BID) | ORAL | Status: DC
  Administered 2013-02-18 – 2013-02-22 (×8): 500 mg via ORAL
  Filled 2013-02-18 (×10): qty 1

## 2013-02-18 NOTE — Progress Notes (Signed)
Pt's D.Dimer elevated at 7.3, on-call MD notified. Orders for CT angio Stat. Will continue to monitor.  Barbee Shropshire. Brigitte Pulse, RN

## 2013-02-18 NOTE — Progress Notes (Signed)
    Subjective:  Still doesn't feel well, but no specific symptoms. Denies CP or dyspnea. Feels weak all over.  Objective:  Vital Signs in the last 24 hours: Temp:  [97.8 F (36.6 C)-98.6 F (37 C)] 98.5 F (36.9 C) (03/14 0440) Pulse Rate:  [76-87] 78 (03/14 0440) Resp:  [16-18] 18 (03/14 0440) BP: (125-136)/(69-84) 125/70 mmHg (03/14 0440) SpO2:  [88 %-100 %] 100 % (03/14 0440) Weight:  [57.153 kg (126 lb)] 57.153 kg (126 lb) (03/13 1345)  Intake/Output from previous day: 03/13 0701 - 03/14 0700 In: 1493 [I.V.:990; IV Piggyback:503] Out: -   Physical Exam: Pt is alert and oriented, pleasant elderly woman in NAD HEENT: normal Neck: JVP - normal Lungs: CTA bilaterally CV: RRR with soft systolic murmur at the LSB Abd: soft, NT, Positive BS, no hepatomegaly Ext: no C/C/E, distal pulses intact and equal Skin: warm/dry no rash   Lab Results:  Recent Labs  02/17/13 1603 02/18/13 0320  WBC 6.1 4.2  HGB 11.1* 9.6*  PLT 175 158    Recent Labs  02/17/13 1603 02/18/13 0320  NA 141 138  K 3.9 3.8  CL 103 105  CO2 27 25  GLUCOSE 104* 88  BUN 25* 22  CREATININE 1.01 0.92    Recent Labs  02/17/13 2245 02/18/13 0320  TROPONINI 0.69* 0.47*   Cardiac Studies: 2D Echo Pending  Tele: Sinus rhythm, no significant arrhythmia  CTA Chest: Findings:  Aorta normal caliber with scattered atherosclerotic calcifications.  Scattered coronary arterial calcifications.  Large filling defects are identified at the bifurcations of the  left and right pulmonary arteries consistent with pulmonary  embolism.  Additional emboli are noted within bilateral lower lobes, lingula,  and right upper lobe.  Mildly dilated main pulmonary artery 3.4 cm transverse.  Dilated esophagus, partially fluid-filled, with probable moderate  hiatal hernia versus distended esophagus.  Visualized portion of upper abdomen unremarkable.  No thoracic adenopathy.  No pulmonary infiltrate, pleural  effusion or pneumothorax.  Bones appear demineralized.  IMPRESSION:  Multiple bilateral pulmonary emboli.  Mildly dilated main pulmonary artery 3.4 cm transverse.  Dilated thoracic esophagus with question hiatal hernia.  Assessment/Plan:  Acute Pulmonary Embolus with elevated troponin. Pt with hx of DVT. She will require lifelong anticoagulation. Currently on IV heparin. Decision for warfarin versus Xarelto per [facility] service. Will review 2D echo when completed. Fortunately, she is hemodynamically stable with no respiratory distress - hopefully will respond well to anticoagulation. Assess RV function on echo today. Recommend STOP aspirin when anticoagulation started to reduce bleeding risk in this elderly woman. Not much else to add from cardiac standpoint at this time. Please call with questions. thx  Sherren Mocha, M.D. 02/18/2013, 8:14 AM

## 2013-02-18 NOTE — Progress Notes (Addendum)
OT Cancellation Note  Patient Details Name: [name] MRN: [id number] DOB: [birth year]   Cancelled Treatment:    Reason Eval/Treat Not Completed: Medical issues which prohibited therapy   Pt has new PE and is supratherapeutic.  Too fatiqued for OT eval eval today.   Will check back tomorrow, if schedule permits.     SPENCER,MARYELLEN 02/18/2013, 2:41 PM Lesle Chris, OTR/L [phone] 02/18/2013

## 2013-02-18 NOTE — Progress Notes (Signed)
ANTICOAGULATION CONSULT NOTE - Follow Up  Pharmacy Consult for Heparin Indication: pulmonary embolus  Allergies  Allergen Reactions  . Sulfa Antibiotics Nausea And Vomiting  . Penicillins Hives and Rash    Patient Measurements: Height: 5' (152.4 cm) Weight: 126 lb (57.153 kg) IBW/kg (Calculated) : 45.5 Heparin Dosing Weight: 50 kg  Vital Signs: Temp: 97.6 F (36.4 C) (03/14 2112) Temp src: Oral (03/14 2112) BP: 153/57 mmHg (03/14 2112) Pulse Rate: 61 (03/14 2150)  Labs:  Recent Labs  02/17/13 1603 02/17/13 2245 02/18/13 0320 02/18/13 1150 02/18/13 2224  HGB 11.1*  --  9.6*  --   --   HCT 33.6*  --  30.6*  --   --   PLT 175  --  158  --   --   APTT 29  --   --   --   --   LABPROT 13.7  --   --   --   --   INR 1.06  --   --   --   --   HEPARINUNFRC  --   --   --  1.04* 0.31  CREATININE 1.01  --  0.92  --   --   TROPONINI 1.08* 0.69* 0.47*  --   --     Estimated Creatinine Clearance: 31.6 ml/min (by C-G formula based on Cr of 0.92).   Medical History: Past Medical History  Diagnosis Date  . Cyst and pseudocyst of pancreas   . Unspecified essential hypertension   . Other and unspecified hyperlipidemia   . Polymyalgia rheumatica   . Personal history of unspecified digestive disease   . Restless legs syndrome (RLS)   . Insomnia, unspecified   . Other malaise and fatigue   . Diverticulosis of colon (without mention of hemorrhage)   . Acute gastritis without mention of hemorrhage   . Duodenitis without mention of hemorrhage   . Diaphragmatic hernia without mention of obstruction or gangrene   . Esophageal reflux   . Stricture and stenosis of esophagus   . OAB (overactive bladder)   . Left leg DVT jan 2013  . Unspecified sleep apnea     hx of sleep apnea, none since weight loss  . Diabetes mellitus     Medications:  Scheduled:  . ciprofloxacin  500 mg Oral BID  . docusate sodium  100 mg Oral BID  . [START ON 02/19/2013] feeding supplement  1 Container  Oral BID BM  . gabapentin  100 mg Oral TID  . [COMPLETED] heparin  2,000 Units Intravenous Once  . insulin aspart  0-9 Units Subcutaneous TID WC  . metoprolol tartrate  12.5 mg Oral BID  . mirtazapine  15 mg Oral QHS  . pantoprazole  40 mg Oral Daily  . rOPINIRole  6 mg Oral QHS  . sodium chloride  3 mL Intravenous Q12H  . [DISCONTINUED] aspirin  81 mg Oral Daily  . [DISCONTINUED] ciprofloxacin  400 mg Intravenous Q12H  . [DISCONTINUED] enoxaparin (LOVENOX) injection  30 mg Subcutaneous Q24H   Infusions:  . sodium chloride 75 mL/hr at 02/18/13 1809  . heparin 500 Units/hr (02/18/13 1338)  . [DISCONTINUED] heparin 800 Units/hr (02/18/13 0228)    Assessment:  79 yo admitted 3/13 c/o weakness.  Pt with history of DVT 1/13.  Now with D dimer=7.3, CT revealed multiple bilateral  PEs.  NP ordering IV heparin per Rx.  First heparin level supratherapeutic at 1.04 with no bleeding noted on heparin IV 800 units/hr; heparin held x  1 hr then reduced to 500 units/hr  Note Hgb and plts dropped some today from yesterday  Noted that patient lost one peripheral IV site for antibiotics.  IV heparin infusing through another peripheral site  Heparin level = 0.31 with heparin infusing at 500 units/hr. No complications of therapy noted.  Goal of Therapy:  Heparin level 0.3-0.7 units/ml Monitor platelets by anticoagulation protocol: Yes   Plan:  1) As heparin level now at the very low end of therapeutic range, will increase rate slightly to ensure therapeutic dose; Will increase heparin drip to 550 units/hr 2) Recheck heparin rate in with AM labs 3) Daily CBC and heparin level  Leone Haven, PharmD 02/18/2013 11:03 PM

## 2013-02-18 NOTE — Progress Notes (Signed)
Unable to get another IV site for IV antibiotics and IV team tried but was unable as well. Dr. Grandville Silos notified, no new order given.

## 2013-02-18 NOTE — Progress Notes (Signed)
INITIAL NUTRITION ASSESSMENT  DOCUMENTATION CODES Per approved criteria  -Not Applicable   INTERVENTION: Provide Ensure pudding BID Provided High Calorie High Protein Nutrition therapy education   NUTRITION DIAGNOSIS: Inadequate oral intake related to decreased appetite and decreased interest in food as evidenced by pt report of eating 25% of meals.    Goal: Pt to meet >/= 90% of their estimated nutrition needs  Monitor:  PO intake Blood glucose Wt  Reason for Assessment: MST  79 y.o. female  Admitting Dx: Acute pulmonary embolism  ASSESSMENT: 79 y.o. female with past medical history of diabetes type 2, hypertension, or for neuropathy, prior history of DVT currently off anticoagulations who had presented to her PCPs office with a one-day history of worsening shortness of breath, generalized weakness to the point where she was unable to get out of bed and stand, an episode of emesis the night prior to admission. Pt reports that she has had a decreased appetite and decreased interest in food/eating since she fell in January 2014. Pt states was eating 3 small meals daily PTA; mostly sandwiches. Pt reports eating 25% of meals since admission; 50% per nursing notes. Pt reports her usual body weight is 130 lbs. Per pt supplements such as Ensure cause diarrhea.  Provided "High Calorie High Protein Nutrition Therapy" handout from the [employer] to help prevent wt loss and loss of muscle mass. Reviewed handout and encouraged increased protein and dairy in diet. Discussed ways to prevent weight loss and to increase interest in eating. Expect good compliance.  Height: Ht Readings from Last 1 Encounters:  02/17/13 5' (1.524 m)    Weight: Wt Readings from Last 1 Encounters:  02/17/13 126 lb (57.153 kg)    Ideal Body Weight: 100 lbs  % Ideal Body Weight: 126%  Wt Readings from Last 10 Encounters:  02/17/13 126 lb (57.153 kg)  12/05/12 142 lb 10.2 oz (64.7  kg)  08/04/12 132 lb (59.875 kg)  08/04/12 132 lb (59.875 kg)  07/30/12 132 lb 11.2 oz (60.192 kg)  01/07/12 130 lb 11.7 oz (59.3 kg)  04/03/11 133 lb (60.328 kg)  03/31/11 133 lb (60.328 kg)  09/06/08 147 lb 8 oz (66.906 kg)    Usual Body Weight: 130 lbs  % Usual Body Weight: 97%  BMI:  Body mass index is 24.61 kg/(m^2).  Estimated Nutritional Needs: Kcal: 1260-1470 Protein: 57-68 grams Fluid: >1.7 L  Skin: non-pitting RLE and LLE edema  Diet Order: Carb Control  EDUCATION NEEDS: -Education needs addressed   Intake/Output Summary (Last 24 hours) at 02/18/13 1624 Last data filed at 02/18/13 1300  Gross per 24 hour  Intake 1853.02 ml  Output    150 ml  Net 1703.02 ml    Last BM: 3/11  Labs:   Recent Labs Lab 02/17/13 1603 02/18/13 0320  NA 141 138  K 3.9 3.8  CL 103 105  CO2 27 25  BUN 25* 22  CREATININE 1.01 0.92  CALCIUM 9.2 8.2*  MG 1.5 2.4  GLUCOSE 104* 88    CBG (last 3)   Recent Labs  02/18/13 0756 02/18/13 1242  GLUCAP 109* 74    Scheduled Meds: . ciprofloxacin  400 mg Intravenous Q12H  . docusate sodium  100 mg Oral BID  . gabapentin  100 mg Oral TID  . insulin aspart  0-9 Units Subcutaneous TID WC  . metoprolol tartrate  12.5 mg Oral BID  . mirtazapine  15 mg Oral QHS  . pantoprazole  40 mg Oral Daily  . rOPINIRole  6 mg Oral QHS  . sodium chloride  3 mL Intravenous Q12H    Continuous Infusions: . sodium chloride 75 mL/hr at 02/18/13 0611  . heparin 500 Units/hr (02/18/13 1338)    Past Medical History  Diagnosis Date  . Cyst and pseudocyst of pancreas   . Unspecified essential hypertension   . Other and unspecified hyperlipidemia   . Polymyalgia rheumatica   . Personal history of unspecified digestive disease   . Restless legs syndrome (RLS)   . Insomnia, unspecified   . Other malaise and fatigue   . Diverticulosis of colon (without mention of hemorrhage)   . Acute gastritis without mention of hemorrhage   .  Duodenitis without mention of hemorrhage   . Diaphragmatic hernia without mention of obstruction or gangrene   . Esophageal reflux   . Stricture and stenosis of esophagus   . OAB (overactive bladder)   . Left leg DVT jan 2013  . Unspecified sleep apnea     hx of sleep apnea, none since weight loss  . Diabetes mellitus     Past Surgical History  Procedure Laterality Date  . Oophorectomy  1944    not sure which ovary  . Rotator cuff repair  right     4-5 yrs ago  . Eye surgery      cataracts removed-bilateral eyes  . Back surgery  2009    lower back  . Colon surgery  June 17, 2012    surgery for bleed  . Shoulder open rotator cuff repair  08/04/2012    Procedure: ROTATOR CUFF REPAIR SHOULDER OPEN;  Surgeon: Tobi Bastos, MD;  Location: [facility];  Service: Orthopedics;  Laterality: Left;  with Anchors and Graft    Pryor Ochoa RD, LDN Inpatient Clinical Dietitian Pager: [phone] After Hours Pager: [phone]

## 2013-02-18 NOTE — Progress Notes (Signed)
Echocardiogram 2D Echocardiogram has been performed.  [name], [name], 3:28 PM

## 2013-02-18 NOTE — Progress Notes (Signed)
PT Cancellation Note  Patient Details Name: [name] MRN: [id number] DOB: 12/29/1918   Cancelled Treatment:    Reason Eval/Treat Not Completed: Medical issues which prohibited therapy, very weak, on hep.    Claretha Cooper 02/18/2013, 4:54 PM

## 2013-02-18 NOTE — Progress Notes (Signed)
VASCULAR LAB PRELIMINARY  PRELIMINARY  PRELIMINARY  PRELIMINARY  Bilateral lower extremity venous duplex  completed.    Preliminary report:  Right:  DVT noted in the gastrocnemius vein and the peroneal vein.  No evidence of superficial thrombosis.  No Baker's cyst.  Left: DVT noted in the distal FV, popliteal vein, PTV, and peroneal vein.  No evidence of superficial thrombosis.  No Baker's cyst.   CESTONE, HELENE, RVT 02/18/2013, 10:05 AM

## 2013-02-18 NOTE — Progress Notes (Signed)
ANTICOAGULATION CONSULT NOTE - Follow Up  Pharmacy Consult for Heparin Indication: pulmonary embolus  Allergies  Allergen Reactions  . Sulfa Antibiotics Nausea And Vomiting  . Penicillins Hives and Rash    Patient Measurements: Height: 5' (152.4 cm) Weight: 126 lb (57.153 kg) IBW/kg (Calculated) : 45.5 Heparin Dosing Weight: 50 kg  Vital Signs: Temp: 98.5 F (36.9 C) (03/14 0440) Temp src: Oral (03/14 0440) BP: 104/64 mmHg (03/14 1007) Pulse Rate: 63 (03/14 1007)  Labs:  Recent Labs  02/17/13 1603 02/17/13 2245 02/18/13 0320 02/18/13 1150  HGB 11.1*  --  9.6*  --   HCT 33.6*  --  30.6*  --   PLT 175  --  158  --   APTT 29  --   --   --   LABPROT 13.7  --   --   --   INR 1.06  --   --   --   HEPARINUNFRC  --   --   --  1.04*  CREATININE 1.01  --  0.92  --   TROPONINI 1.08* 0.69* 0.47*  --     Estimated Creatinine Clearance: 31.6 ml/min (by C-G formula based on Cr of 0.92).   Medical History: Past Medical History  Diagnosis Date  . Cyst and pseudocyst of pancreas   . Unspecified essential hypertension   . Other and unspecified hyperlipidemia   . Polymyalgia rheumatica   . Personal history of unspecified digestive disease   . Restless legs syndrome (RLS)   . Insomnia, unspecified   . Other malaise and fatigue   . Diverticulosis of colon (without mention of hemorrhage)   . Acute gastritis without mention of hemorrhage   . Duodenitis without mention of hemorrhage   . Diaphragmatic hernia without mention of obstruction or gangrene   . Esophageal reflux   . Stricture and stenosis of esophagus   . OAB (overactive bladder)   . Left leg DVT jan 2013  . Unspecified sleep apnea     hx of sleep apnea, none since weight loss  . Diabetes mellitus     Medications:  Scheduled:  . [COMPLETED] aspirin  324 mg Oral Once  . aspirin  81 mg Oral Daily  . ciprofloxacin  400 mg Intravenous Q12H  . docusate sodium  100 mg Oral BID  . gabapentin  100 mg Oral TID  .  [COMPLETED] heparin  2,000 Units Intravenous Once  . insulin aspart  0-9 Units Subcutaneous TID WC  . [COMPLETED] magnesium sulfate 1 - 4 g bolus IVPB  3 g Intravenous Once  . metoprolol tartrate  12.5 mg Oral BID  . mirtazapine  15 mg Oral QHS  . pantoprazole  40 mg Oral Daily  . rOPINIRole  6 mg Oral QHS  . [COMPLETED] sodium chloride  1,000 mL Intravenous Once  . sodium chloride  3 mL Intravenous Q12H  . [DISCONTINUED] enoxaparin (LOVENOX) injection  30 mg Subcutaneous Q24H  . [DISCONTINUED] enoxaparin (LOVENOX) injection  40 mg Subcutaneous Q24H  . [DISCONTINUED] gabapentin  300 mg Oral BID  . [DISCONTINUED] rOPINIRole  8 mg Oral QHS  . [DISCONTINUED] Ropinirole HCl  1 tablet Oral QPM  . [DISCONTINUED] Ropinirole HCl  1 tablet Oral QPM   Infusions:  . sodium chloride 75 mL/hr at 02/18/13 0611  . heparin    . [DISCONTINUED] heparin 800 Units/hr (02/18/13 0228)    Assessment:  79 yo admitted 3/13 c/o weakness.  Pt with history of DVT 1/13.  Now with D dimer=7.3,  CT revealed multiple bilateral  PEs.  NP ordering IV heparin per Rx.  First heparin level supratherapeutic at 1.04 with no bleeding noted on heparin IV 800 units/hr  Note Hgb and plts dropped some today from yesterday  Goal of Therapy:  Heparin level 0.3-0.7 units/ml Monitor platelets by anticoagulation protocol: Yes   Plan:  1) Due to patient age of 79 yo and renal dysfunction with level > 1, will hold heparin x 1 hr then reduce rate to 500 units/hr 2) Recheck heparin rate in 8 hrs 3) Daily CBC and heparin rate   Adrian Saran, PharmD, BCPS Pager [phone] 02/18/2013 12:33 PM

## 2013-02-18 NOTE — Progress Notes (Signed)
ANTICOAGULATION CONSULT NOTE - Initial Consult  Pharmacy Consult for Heparin Indication: pulmonary embolus  Allergies  Allergen Reactions  . Sulfa Antibiotics Nausea And Vomiting  . Penicillins Hives and Rash    Patient Measurements: Height: 5' (152.4 cm) Weight: 126 lb (57.153 kg) IBW/kg (Calculated) : 45.5 Heparin Dosing Weight: 50 kg  Vital Signs: Temp: 98.6 F (37 C) (03/13 2108) Temp src: Oral (03/13 2108) BP: 126/69 mmHg (03/13 2108) Pulse Rate: 76 (03/13 2108)  Labs:  Recent Labs  02/17/13 1603 02/17/13 2245  HGB 11.1*  --   HCT 33.6*  --   PLT 175  --   APTT 29  --   LABPROT 13.7  --   INR 1.06  --   CREATININE 1.01  --   TROPONINI 1.08* 0.69*    Estimated Creatinine Clearance: 28.8 ml/min (by C-G formula based on Cr of 1.01).   Medical History: Past Medical History  Diagnosis Date  . Cyst and pseudocyst of pancreas   . Unspecified essential hypertension   . Other and unspecified hyperlipidemia   . Polymyalgia rheumatica   . Personal history of unspecified digestive disease   . Restless legs syndrome (RLS)   . Insomnia, unspecified   . Other malaise and fatigue   . Diverticulosis of colon (without mention of hemorrhage)   . Acute gastritis without mention of hemorrhage   . Duodenitis without mention of hemorrhage   . Diaphragmatic hernia without mention of obstruction or gangrene   . Esophageal reflux   . Stricture and stenosis of esophagus   . OAB (overactive bladder)   . Left leg DVT jan 2013  . Unspecified sleep apnea     hx of sleep apnea, none since weight loss  . Diabetes mellitus     Medications:  Scheduled:  . [COMPLETED] aspirin  324 mg Oral Once  . aspirin  81 mg Oral Daily  . ciprofloxacin  400 mg Intravenous Q12H  . docusate sodium  100 mg Oral BID  . gabapentin  100 mg Oral TID  . heparin  2,000 Units Intravenous Once  . insulin aspart  0-9 Units Subcutaneous TID WC  . [COMPLETED] magnesium sulfate 1 - 4 g bolus IVPB  3  g Intravenous Once  . metoprolol tartrate  12.5 mg Oral BID  . mirtazapine  15 mg Oral QHS  . pantoprazole  40 mg Oral Daily  . rOPINIRole  6 mg Oral QHS  . [COMPLETED] sodium chloride  1,000 mL Intravenous Once  . sodium chloride  3 mL Intravenous Q12H  . [DISCONTINUED] enoxaparin (LOVENOX) injection  30 mg Subcutaneous Q24H  . [DISCONTINUED] enoxaparin (LOVENOX) injection  40 mg Subcutaneous Q24H  . [DISCONTINUED] gabapentin  300 mg Oral BID  . [DISCONTINUED] rOPINIRole  8 mg Oral QHS  . [DISCONTINUED] Ropinirole HCl  1 tablet Oral QPM  . [DISCONTINUED] Ropinirole HCl  1 tablet Oral QPM   Infusions:  . sodium chloride 75 mL/hr at 02/17/13 1817  . heparin      Assessment: 79 yo admitted 3/13 c/o weakness.  Pt with history of DVT 1/13.  Now with D dimer=7.3, CT revealed multiple bilateral  PEs.  NP ordering IV heparin per Rx.  Goal of Therapy:  Heparin level 0.3-0.7 units/ml Monitor platelets by anticoagulation protocol: Yes   Plan:   Heparin 2000 units bolus IV x1.  Pt had 37m Lovenox @ 2230.  Start heparin drip @ 800 units/hr.  Daily CBC/HL  Check 1st HL in 6 hours.  Lawana Pai R 02/18/2013,2:14 AM

## 2013-02-18 NOTE — Progress Notes (Signed)
[facility] PROGRESS NOTE  [name] FGH:[id number] DOB: 1919/11/05 DOA: 02/17/2013 PCP: Benito Mccreedy, MD  Assessment/Plan: #1 acute bilateral PE/bilateral lower extremity DVTs Patient had a left lower extremity DVT in January of 2013 was treated with anticoagulation and was off anticoagulation. Patient does have some mild shortness of breath. Patient is on IV heparin. Lower extremity Dopplers consistent with bilateral lower extremity DVTs. 2-D echo pending to rule out right ventricular strain.  #2 abnormal troponins Likely secondary to problem #1. Cardiac enzymes trending down. 2-D echo is pending. Continue Lopressor and aspirin. Per cardiology.  #3 urinary tract infection Urine cultures are pending. Continue ciprofloxacin.  #4 shortness of breath/generalized weakness Likely secondary to problems #1 and 3.  #5 dehydration IV fluids.  #6 type 2 diabetes Hemoglobin A1c 7.3 on 02/01/2013. Continue sliding scale insulin.  #7 EKG changes Secondary to problem #1.  #8 restless leg syndrome Continue Requip.  #9 depression Stable.  #10 chronic pain Continue home regimen.  #11 prophylaxis PPI for GI prophylaxis. Heparin for DVT.  Code Status: DO NOT RESUSCITATE Family Communication: Updated patient no family at bedside. Disposition Plan: Home when medically stable.   Consultants:  Cardiology: Dr. Mare Ferrari 02/17/2013  Procedures:  CT angio chest 02/17/2013  Antibiotics:  IV ciprofloxacin 02/17/2013  HPI/Subjective: Patient with complaints of some shortness of breath. Patient denies any chest pain. Patient tolerating diet.  Objective: Filed Vitals:   02/17/13 1400 02/17/13 2108 02/18/13 0440 02/18/13 1007  BP:  126/69 125/70 104/64  Pulse:  76 78 63  Temp:  98.6 F (37 C) 98.5 F (36.9 C)   TempSrc:  Oral Oral   Resp:  16 18   Height:      Weight:      SpO2: 93% 100% 100%     Intake/Output Summary (Last 24 hours) at 02/18/13 1344 Last  data filed at 02/18/13 0900  Gross per 24 hour  Intake 1733.02 ml  Output    150 ml  Net 1583.02 ml   Filed Weights   02/17/13 1345  Weight: 57.153 kg (126 lb)    Exam:   General:  NAD  Cardiovascular: RRR. nO jvd, nO le EDEMA  Respiratory: CTAB  Abdomen: Soft/NT/ND/+BS  Data Reviewed: Basic Metabolic Panel:  Recent Labs Lab 02/17/13 1603 02/18/13 0320  NA 141 138  K 3.9 3.8  CL 103 105  CO2 27 25  GLUCOSE 104* 88  BUN 25* 22  CREATININE 1.01 0.92  CALCIUM 9.2 8.2*  MG 1.5 2.4   Liver Function Tests:  Recent Labs Lab 02/17/13 1603  AST 24  ALT 15  ALKPHOS 103  BILITOT 0.4  PROT 6.1  ALBUMIN 3.2*   No results found for this basename: LIPASE, AMYLASE,  in the last 168 hours No results found for this basename: AMMONIA,  in the last 168 hours CBC:  Recent Labs Lab 02/17/13 1603 02/18/13 0320  WBC 6.1 4.2  NEUTROABS 4.0  --   HGB 11.1* 9.6*  HCT 33.6* 30.6*  MCV 89.1 90.0  PLT 175 158   Cardiac Enzymes:  Recent Labs Lab 02/17/13 1603 02/17/13 2245 02/18/13 0320  TROPONINI 1.08* 0.69* 0.47*   BNP (last 3 results) No results found for this basename: PROBNP,  in the last 8760 hours CBG:  Recent Labs Lab 02/18/13 0756 02/18/13 1242  GLUCAP 109* 74    No results found for this or any previous visit (from the past 240 hour(s)).   Studies: Ct Angio Chest Pe W/cm &/or Wo  Cm  02/18/2013  *RADIOLOGY REPORT*  Clinical Data: Elevated D-dimer, history diabetes, polymyalgia rheumatica, hypertension  CT ANGIOGRAPHY CHEST  Technique:  Multidetector CT imaging of the chest using the standard protocol during bolus administration of intravenous contrast. Multiplanar reconstructed images including MIPs were obtained and reviewed to evaluate the vascular anatomy.  Contrast: 12m OMNIPAQUE IOHEXOL 350 MG/ML SOLN  Comparison: None  Findings: Aorta normal caliber with scattered atherosclerotic calcifications. Scattered coronary arterial calcifications.  Large filling defects are identified at the bifurcations of the left and right pulmonary arteries consistent with pulmonary embolism. Additional emboli are noted within bilateral lower lobes, lingula, and right upper lobe. Mildly dilated main pulmonary artery 3.4 cm transverse.  Dilated esophagus, partially fluid-filled, with probable moderate hiatal hernia versus distended esophagus. Visualized portion of upper abdomen unremarkable. No thoracic adenopathy. No pulmonary infiltrate, pleural effusion or pneumothorax. Bones appear demineralized.  IMPRESSION: Multiple bilateral pulmonary emboli. Mildly dilated main pulmonary artery 3.4 cm transverse. Dilated thoracic esophagus with question hiatal hernia.  Critical Value/emergent results were called by telephone at the time of interpretation on 02/18/2013 at 0151 hours to [facility] 4th floor nursing unti, who verbally acknowledged these results.   Original Report Authenticated By: MLavonia Dana M.D.    Dg Chest Port 1 View  02/17/2013  *RADIOLOGY REPORT*  Clinical Data: Shortness of breath and weakness.  PORTABLE CHEST - 1 VIEW  Comparison: Chest x-ray 12/06/2012.  Findings: Lung volumes are normal.  No consolidative airspace disease.  No pleural effusions.  No evidence of pulmonary edema. Heart size is borderline to mildly enlarged (unchanged).  Moderate to large hiatal hernia. The patient is rotated to the right on today's exam, resulting in distortion of the mediastinal contours and reduced diagnostic sensitivity and specificity for mediastinal pathology.  Atherosclerosis in the thoracic aorta.  IMPRESSION: 1.  No radiographic evidence of acute cardiopulmonary disease. Allowing for slight differences in patient positioning, the radiographic appearance of the chest is essentially unchanged, as above.   Original Report Authenticated By: DVinnie Langton M.D.     Scheduled Meds: . ciprofloxacin  400 mg Intravenous Q12H  . docusate sodium  100 mg Oral BID  .  gabapentin  100 mg Oral TID  . insulin aspart  0-9 Units Subcutaneous TID WC  . metoprolol tartrate  12.5 mg Oral BID  . mirtazapine  15 mg Oral QHS  . pantoprazole  40 mg Oral Daily  . rOPINIRole  6 mg Oral QHS  . sodium chloride  3 mL Intravenous Q12H   Continuous Infusions: . sodium chloride 75 mL/hr at 02/18/13 09604 . heparin 500 Units/hr (02/18/13 1338)    Principal Problem:   Acute pulmonary embolism Active Problems:   HYPERLIPIDEMIA   RESTLESS LEG SYNDROME   HYPERTENSION   GERD   DUODENITIS   Polymyalgia rheumatica   FATIGUE   CROHN'S DISEASE, HX OF   Overactive bladder   Fall   UTI (urinary tract infection)   Weakness generalized   SOB (shortness of breath)   DVT, bilateral lower limbs    Time spent: > 35 mins    [facility] Pager [phone] If 7PM-7AM, please contact night-coverage at [email or web address], password [email or web address]3/14/2014, 1:44 PM  LOS: 1 day

## 2013-02-18 NOTE — Progress Notes (Signed)
   CARE MANAGEMENT NOTE 02/18/2013  Patient:  [name], [name]   Account Number:  [id number]  Date Initiated:  02/18/2013  Documentation initiated by:  Olga Coaster  Subjective/Objective Assessment:   ADMITTED WITH PE, MI     Action/Plan:   PCP: Benito Mccreedy, MD  LIVES ALONE, RECENT ADMISSION TO SNF; AWAITING FOR PT/OT EVALS FOR HOME VS SNF PLACEMENT   Anticipated DC Date:  02/25/2013   Anticipated DC Plan:  SKILLED NURSING FACILITY  In-house referral  Clinical Social Worker      DC Planning Services  CM consult        Status of service:  In process, will continue to follow Medicare Important Message given?  NA - LOS <3 / Initial given by admissions (If response is "NO", the following Medicare IM given date fields will be blank) Per UR Regulation:  Reviewed for med. necessity/level of care/duration of stay Comments:  02/18/2013- B CHANDLER RN,BSN,MHA

## 2013-02-19 DIAGNOSIS — I82409 Acute embolism and thrombosis of unspecified deep veins of unspecified lower extremity: Secondary | ICD-10-CM | POA: Diagnosis not present

## 2013-02-19 DIAGNOSIS — I2699 Other pulmonary embolism without acute cor pulmonale: Secondary | ICD-10-CM | POA: Diagnosis not present

## 2013-02-19 DIAGNOSIS — R5383 Other fatigue: Secondary | ICD-10-CM | POA: Diagnosis not present

## 2013-02-19 DIAGNOSIS — N390 Urinary tract infection, site not specified: Secondary | ICD-10-CM | POA: Diagnosis not present

## 2013-02-19 DIAGNOSIS — R5381 Other malaise: Secondary | ICD-10-CM | POA: Diagnosis not present

## 2013-02-19 LAB — CBC
HCT: 33.8 % — ABNORMAL LOW (ref 36.0–46.0)
MCH: 28.8 pg (ref 26.0–34.0)
MCHC: 32.2 g/dL (ref 30.0–36.0)
MCV: 89.4 fL (ref 78.0–100.0)
RDW: 15.9 % — ABNORMAL HIGH (ref 11.5–15.5)

## 2013-02-19 LAB — GLUCOSE, CAPILLARY
Glucose-Capillary: 85 mg/dL (ref 70–99)
Glucose-Capillary: 91 mg/dL (ref 70–99)
Glucose-Capillary: 101 mg/dL — ABNORMAL HIGH (ref 70–99)

## 2013-02-19 LAB — BASIC METABOLIC PANEL
BUN: 16 mg/dL (ref 6–23)
Chloride: 105 mEq/L (ref 96–112)
Creatinine, Ser: 0.89 mg/dL (ref 0.50–1.10)
GFR calc Af Amer: 64 mL/min — ABNORMAL LOW (ref >90)

## 2013-02-19 LAB — HEPARIN LEVEL (UNFRACTIONATED): Heparin Unfractionated: 0.3 IU/mL (ref 0.30–0.70)

## 2013-02-19 MED ORDER — HEPARIN (PORCINE) IN NACL 100-0.45 UNIT/ML-% IJ SOLN
650.0000 [IU]/h | INTRAMUSCULAR | Status: DC
  Administered 2013-02-19: 650 [IU]/h via INTRAVENOUS
  Filled 2013-02-19: qty 250

## 2013-02-19 MED ORDER — LORAZEPAM 0.5 MG PO TABS
0.5000 mg | ORAL_TABLET | Freq: Once | ORAL | Status: AC
  Administered 2013-02-19: 0.5 mg via ORAL
  Filled 2013-02-19: qty 1

## 2013-02-19 MED ORDER — HEPARIN (PORCINE) IN NACL 100-0.45 UNIT/ML-% IJ SOLN
700.0000 [IU]/h | INTRAMUSCULAR | Status: DC
  Administered 2013-02-19: 700 [IU]/h via INTRAVENOUS

## 2013-02-19 NOTE — Progress Notes (Signed)
[facility] PROGRESS NOTE  [name] PZW:[id number] DOB: [birth year] DOA: 02/17/2013 PCP: Benito Mccreedy, MD  Assessment/Plan: #1 acute bilateral PE/bilateral lower extremity DVTs Patient had a left lower extremity DVT in January of 2013 was treated with anticoagulation and was off anticoagulation. Patient does have some mild shortness of breath. Patient is on IV heparin. Will change IV heparin to full dose Lovenox.  Lower extremity Dopplers consistent with bilateral lower extremity DVTs. 2-D echo with dilated mildly right ventricle a moderate to severe pulmonary hypertension consistent with PE.   #2 abnormal troponins Likely secondary to problem #1. Cardiac enzymes trending down. 2-D echo with mildly dilated right ventricle and moderate to severe pulmonary hypertension consistent with PE. Continue Lopressor and aspirin. Per cardiology.  #3 urinary tract infection Urine cultures are pending. Continue ciprofloxacin.  #4 shortness of breath/generalized weakness Likely secondary to problems #1 and 3.  #5 dehydration IV fluids.  #6 type 2 diabetes Hemoglobin A1c 7.3 on 02/01/2013. Continue sliding scale insulin.  #7 EKG changes Secondary to problem #1.  #8 restless leg syndrome Continue Requip.  #9 depression Stable.  #10 chronic pain Continue home regimen.  #11 prophylaxis PPI for GI prophylaxis. Heparin for DVT.  Code Status: DO NOT RESUSCITATE Family Communication: Updated patient no family at bedside. Disposition Plan: Home when medically stable.   Consultants:  Cardiology: Dr. Mare Ferrari 02/17/2013  Procedures:  CT angio chest 02/17/2013  2-D echo 02/18/2013  Antibiotics:  IV ciprofloxacin 02/17/2013  HPI/Subjective: Patient with complaints of some shortness of breath. Patient denies any chest pain. Patient tolerating diet.  Objective: Filed Vitals:   02/18/13 2150 02/19/13 0611 02/19/13 0649 02/19/13 1414  BP:  165/71 153/61 138/67   Pulse: 61 58  58  Temp:  97.4 F (36.3 C)  97.4 F (36.3 C)  TempSrc:  Oral  Oral  Resp:  20  14  Height:      Weight:  59.512 kg (131 lb 3.2 oz)    SpO2:  94%  100%    Intake/Output Summary (Last 24 hours) at 02/19/13 1659 Last data filed at 02/19/13 0704  Gross per 24 hour  Intake 1296.1 ml  Output      0 ml  Net 1296.1 ml   Filed Weights   02/17/13 1345 02/19/13 0611  Weight: 57.153 kg (126 lb) 59.512 kg (131 lb 3.2 oz)    Exam:   General:  NAD  Cardiovascular: RRR. nO jvd, nO le EDEMA  Respiratory: CTAB  Abdomen: Soft/NT/ND/+BS  Data Reviewed: Basic Metabolic Panel:  Recent Labs Lab 02/17/13 1603 02/18/13 0320 02/19/13 0517  NA 141 138 138  K 3.9 3.8 3.7  CL 103 105 105  CO2 27 25 23   GLUCOSE 104* 88 90  BUN 25* 22 16  CREATININE 1.01 0.92 0.89  CALCIUM 9.2 8.2* 8.5  MG 1.5 2.4  --    Liver Function Tests:  Recent Labs Lab 02/17/13 1603  AST 24  ALT 15  ALKPHOS 103  BILITOT 0.4  PROT 6.1  ALBUMIN 3.2*   No results found for this basename: LIPASE, AMYLASE,  in the last 168 hours No results found for this basename: AMMONIA,  in the last 168 hours CBC:  Recent Labs Lab 02/17/13 1603 02/18/13 0320 02/19/13 0517  WBC 6.1 4.2 4.9  NEUTROABS 4.0  --   --   HGB 11.1* 9.6* 10.9*  HCT 33.6* 30.6* 33.8*  MCV 89.1 90.0 89.4  PLT 175 158 194   Cardiac Enzymes:  Recent Labs  Lab 02/17/13 1603 02/17/13 2245 02/18/13 0320  TROPONINI 1.08* 0.69* 0.47*   BNP (last 3 results) No results found for this basename: PROBNP,  in the last 8760 hours CBG:  Recent Labs Lab 02/18/13 1242 02/18/13 1726 02/18/13 2110 02/19/13 0802 02/19/13 1250  GLUCAP 74 102* 101* 85 91    Recent Results (from the past 240 hour(s))  URINE CULTURE     Status: None   Collection Time    02/17/13  6:17 PM      Result Value Range Status   Specimen Description URINE, CATHETERIZED   Final   Special Requests NONE   Final   Culture  Setup Time 02/18/2013  01:41   Final   Colony Count >=100,000 COLONIES/ML   Final   Culture GRAM NEGATIVE RODS   Final   Report Status PENDING   Incomplete     Studies: Ct Angio Chest Pe W/cm &/or Wo Cm  02/18/2013  *RADIOLOGY REPORT*  Clinical Data: Elevated D-dimer, history diabetes, polymyalgia rheumatica, hypertension  CT ANGIOGRAPHY CHEST  Technique:  Multidetector CT imaging of the chest using the standard protocol during bolus administration of intravenous contrast. Multiplanar reconstructed images including MIPs were obtained and reviewed to evaluate the vascular anatomy.  Contrast: 110m OMNIPAQUE IOHEXOL 350 MG/ML SOLN  Comparison: None  Findings: Aorta normal caliber with scattered atherosclerotic calcifications. Scattered coronary arterial calcifications. Large filling defects are identified at the bifurcations of the left and right pulmonary arteries consistent with pulmonary embolism. Additional emboli are noted within bilateral lower lobes, lingula, and right upper lobe. Mildly dilated main pulmonary artery 3.4 cm transverse.  Dilated esophagus, partially fluid-filled, with probable moderate hiatal hernia versus distended esophagus. Visualized portion of upper abdomen unremarkable. No thoracic adenopathy. No pulmonary infiltrate, pleural effusion or pneumothorax. Bones appear demineralized.  IMPRESSION: Multiple bilateral pulmonary emboli. Mildly dilated main pulmonary artery 3.4 cm transverse. Dilated thoracic esophagus with question hiatal hernia.  Critical Value/emergent results were called by telephone at the time of interpretation on 02/18/2013 at 0151 hours to [facility] 4th floor nursing unti, who verbally acknowledged these results.   Original Report Authenticated By: MLavonia Dana M.D.     Scheduled Meds: . ciprofloxacin  500 mg Oral BID  . docusate sodium  100 mg Oral BID  . feeding supplement  1 Container Oral BID BM  . gabapentin  100 mg Oral TID  . insulin aspart  0-9 Units Subcutaneous TID WC   . metoprolol tartrate  12.5 mg Oral BID  . mirtazapine  15 mg Oral QHS  . pantoprazole  40 mg Oral Daily  . rOPINIRole  6 mg Oral QHS  . sodium chloride  3 mL Intravenous Q12H   Continuous Infusions: . sodium chloride 75 mL/hr at 02/19/13 1055  . heparin 700 Units/hr (02/19/13 1636)    Principal Problem:   Acute pulmonary embolism Active Problems:   HYPERLIPIDEMIA   RESTLESS LEG SYNDROME   HYPERTENSION   GERD   DUODENITIS   Polymyalgia rheumatica   FATIGUE   CROHN'S DISEASE, HX OF   Overactive bladder   Fall   UTI (urinary tract infection)   Weakness generalized   SOB (shortness of breath)   DVT, bilateral lower limbs    Time spent: > 35 mins    [facility] Pager [phone] If 7PM-7AM, please contact night-coverage at [email or web address], password [email or web address]3/15/2014, 4:59 PM  LOS: 2 days

## 2013-02-19 NOTE — Evaluation (Signed)
Occupational Therapy Evaluation Patient Details Name: [name] MRN: [id number] DOB: 08-10-1919 Today's Date: 02/19/2013 Time: [id number] OT Time Calculation (min): 108 min  OT Assessment / Plan / Recommendation Clinical Impression  Pleasant 79 yr old female admitted with PE and LE DVTs.  Presents with general weakness and overall need for min guard assist to min assist for basic ADLs.  Pt will benefit from acute care OT to help increase overalll independence with these tasks.  Currently feel pt would benefit from SNF level rehab however she does have some hired assistance for ADLs 3 days a week.  Pt does not want to go to SNF rehab if possible.      OT Assessment  Patient needs continued OT Services    Follow Up Recommendations  Home health OT;Supervision/Assistance - 24 hour    Barriers to Discharge Decreased caregiver support pt does not have 24 hour supervision  Equipment Recommendations  3 in 1 bedside comode       Frequency  Min 2X/week    Precautions / Restrictions Precautions Precautions: Fall Restrictions Weight Bearing Restrictions: No   Pertinent Vitals/Pain O2 sats 96-98% on room air with activity, HR 74    ADL  Eating/Feeding: Performed;Independent Where Assessed - Eating/Feeding: Chair Grooming: Performed;Supervision/safety Where Assessed - Grooming: Unsupported standing Upper Body Bathing: Simulated;Set up Where Assessed - Upper Body Bathing: Unsupported sitting Lower Body Bathing: Simulated;Minimal assistance Where Assessed - Lower Body Bathing: Unsupported sit to stand Upper Body Dressing: Simulated;Set up Where Assessed - Upper Body Dressing: Unsupported sitting Lower Body Dressing: Simulated;Minimal assistance Where Assessed - Lower Body Dressing: Supported sit to stand Toilet Transfer: Performed;Minimal assistance Toilet Transfer Method: Other (comment) (ambulate with RW) Toilet Transfer Equipment: Raised toilet seat with arms (or 3-in-1 over  toilet) Toileting - Clothing Manipulation and Hygiene: Performed;Min guard Where Assessed - Camera operator Manipulation and Hygiene: Sit to stand from 3-in-1 or toilet Tub/Shower Transfer Method: Not assessed Equipment Used: Rolling walker Transfers/Ambulation Related to ADLs: Pt required min assist for sit to stand from the bed but only min guard from 3:1.  She is able to ambulate with close supervision using the RW. ADL Comments: Pt overall min guard level for simulated selfcare tasks as well as toileting and grooming.  Able to tolerate standing at the sink for 3 mins during grooming tasks.  O2 sats 94-99% on room air.  Pt with decreased bilateral shoulder AROM secondary to history of bilateral shoulder surgery.  Discussed use of long handle comb and bed rail for the side of her bed.    OT Diagnosis: Generalized weakness  OT Problem List: Decreased strength;Decreased activity tolerance;Impaired balance (sitting and/or standing);Decreased knowledge of use of DME or AE OT Treatment Interventions: Self-care/ADL training;Balance training;Therapeutic activities;Energy conservation;DME and/or AE instruction;Patient/family education   OT Goals Acute Rehab OT Goals OT Goal Formulation: With patient Time For Goal Achievement: 03/05/13 Potential to Achieve Goals: Good ADL Goals Pt Will Perform Lower Body Bathing: with supervision;Sit to stand from bed ADL Goal: Lower Body Bathing - Progress: Goal set today Pt Will Perform Lower Body Dressing: with supervision;Sit to stand from bed ADL Goal: Lower Body Dressing - Progress: Goal set today Pt Will Transfer to Toilet: with supervision;with DME;3-in-1 ADL Goal: Toilet Transfer - Progress: Goal set today Pt Will Perform Toileting - Clothing Manipulation: with supervision;Sitting on 3-in-1 or toilet;Standing ADL Goal: Toileting - Clothing Manipulation - Progress: Goal set today Pt Will Perform Toileting - Hygiene: with supervision;Sit to stand from  3-in-1/toilet ADL Goal:  Toileting - Hygiene - Progress: Goal set today Miscellaneous OT Goals Miscellaneous OT Goal #1: Pt will state 3 energy conservation strategies for selfcare tasks with independence following handout. OT Goal: Miscellaneous Goal #1 - Progress: Goal set today  Visit Information  Last OT Received On: 02/19/13 Assistance Needed: +1    Subjective Data  Subjective: What causes me to get these bloodclots? Patient Stated Goal: Wants to go home and to not have to go to rehab.   Prior Functioning     Home Living Lives With: Alone Available Help at Discharge: Personal care attendant Type of Home: Other (Comment) Home Access: Stairs to enter Entrance Stairs-Number of Steps: 5 spread out steps up to front door Entrance Stairs-Rails: Right;Left Bathroom Shower/Tub: Chiropodist: Standard Bathroom Accessibility: Yes How Accessible: Accessible via walker Home Adaptive Equipment: Tub transfer bench;Raised toilet seat with rails;Walker - four wheeled;Straight cane Additional Comments: Has hired assistance from 11:00-2:00 Monday, Wednesday, and Friday Prior Function Level of Independence: Independent Needs Assistance: Meal Prep;Light Housekeeping Bath: Supervision/set-up Meal Prep: Maximal Light Housekeeping: Maximal Able to Take Stairs?: Yes Driving: No Vocation: Retired Corporate investment banker: No difficulties Dominant Hand: Right         Vision/Perception Vision - History Baseline Vision: No visual deficits Patient Visual Report: No change from baseline Vision - Assessment Eye Alignment: Within Functional Limits Vision Assessment: Vision not tested Perception Perception: Within Functional Limits Praxis Praxis: Intact   Cognition  Cognition Overall Cognitive Status: Appears within functional limits for tasks assessed/performed Arousal/Alertness: Awake/alert Orientation Level: Appears intact for tasks assessed Behavior  During Session: [facility] for tasks performed    Extremity/Trunk Assessment Right Upper Extremity Assessment RUE ROM/Strength/Tone: Deficits RUE ROM/Strength/Tone Deficits: Pt with only 10-15 degrees AROM shoulder flexion secondary to history of shoulder replacement.  Alll other joints and AROM WFLS throughout.  Elbow and shoulder strength 3+/5. RUE Sensation: WFL - Light Touch RUE Coordination: WFL - gross/fine motor Left Upper Extremity Assessment LUE ROM/Strength/Tone: Deficits LUE ROM/Strength/Tone Deficits: Pt with only 10-15 degrees AROM shoulder flexion secondary to history of shoulder replacement.  Alll other joints and AROM WFLS throughout.  Elbow and shoulder strength 3+/5. LUE Sensation: WFL - Light Touch LUE Coordination: WFL - gross/fine motor Right Lower Extremity Assessment RLE ROM/Strength/Tone: Deficits RLE ROM/Strength/Tone Deficits: Strength at least 3+/5 throughout Left Lower Extremity Assessment LLE ROM/Strength/Tone: Deficits LLE ROM/Strength/Tone Deficits: Strength at least 3+/5 throughout Trunk Assessment Trunk Assessment: Kyphotic     Mobility Bed Mobility Bed Mobility: Rolling Right;Right Sidelying to Sit Rolling Right: 5: Supervision Right Sidelying to Sit: 4: Min guard;HOB flat Supine to Sit: 4: Min assist Details for Bed Mobility Assistance: increased time needed to transition sidelying to sit EOB Transfers Transfers: Sit to Stand Sit to Stand: From bed;4: Min assist Stand to Sit: To chair/3-in-1;4: Min guard Details for Transfer Assistance: Pt able to stand from 3:1 with min guard but needed min assist from lower EOB.        Balance Balance Balance Assessed: Yes Static Standing Balance Static Standing - Balance Support: No upper extremity supported Static Standing - Level of Assistance: 5: Stand by assistance   End of Session OT - End of Session Activity Tolerance: Patient limited by fatigue Patient left: in bed;with call bell/phone within  reach Nurse Communication: Mobility status     [place] OTR/L Pager number [phone] 02/19/2013, 2:56 PM

## 2013-02-19 NOTE — Progress Notes (Signed)
2D echo with normal LVF, mildly dilated RV and moderate to severe pulmonary HTN c/w acute PE.  No new cardiac recs.  Will sign off.  Call with any questions.

## 2013-02-19 NOTE — Progress Notes (Signed)
ANTICOAGULATION CONSULT NOTE - Follow Up  Pharmacy Consult for Heparin Indication: PE, DVT  Allergies  Allergen Reactions  . Sulfa Antibiotics Nausea And Vomiting  . Penicillins Hives and Rash    Patient Measurements: Height: 5' (152.4 cm) Weight: 131 lb 3.2 oz (59.512 kg) IBW/kg (Calculated) : 45.5 Heparin Dosing Weight: 50 kg  Vital Signs: Temp: 97.4 F (36.3 C) (03/15 0611) Temp src: Oral (03/15 0611) BP: 153/61 mmHg (03/15 0649) Pulse Rate: 58 (03/15 0611)  Labs:  Recent Labs  02/17/13 1603 02/17/13 2245 02/18/13 0320 02/18/13 1150 02/18/13 2224 02/19/13 0517  HGB 11.1*  --  9.6*  --   --  10.9*  HCT 33.6*  --  30.6*  --   --  33.8*  PLT 175  --  158  --   --  194  APTT 29  --   --   --   --   --   LABPROT 13.7  --   --   --   --   --   INR 1.06  --   --   --   --   --   HEPARINUNFRC  --   --   --  1.04* 0.31 0.30  CREATININE 1.01  --  0.92  --   --  0.89  TROPONINI 1.08* 0.69* 0.47*  --   --   --     Estimated Creatinine Clearance: 33.2 ml/min (by C-G formula based on Cr of 0.89).   Medical History: Past Medical History  Diagnosis Date  . Cyst and pseudocyst of pancreas   . Unspecified essential hypertension   . Other and unspecified hyperlipidemia   . Polymyalgia rheumatica   . Personal history of unspecified digestive disease   . Restless legs syndrome (RLS)   . Insomnia, unspecified   . Other malaise and fatigue   . Diverticulosis of colon (without mention of hemorrhage)   . Acute gastritis without mention of hemorrhage   . Duodenitis without mention of hemorrhage   . Diaphragmatic hernia without mention of obstruction or gangrene   . Esophageal reflux   . Stricture and stenosis of esophagus   . OAB (overactive bladder)   . Left leg DVT jan 2013  . Unspecified sleep apnea     hx of sleep apnea, none since weight loss  . Diabetes mellitus     Medications:  Scheduled:  . ciprofloxacin  500 mg Oral BID  . docusate sodium  100 mg Oral BID   . feeding supplement  1 Container Oral BID BM  . gabapentin  100 mg Oral TID  . insulin aspart  0-9 Units Subcutaneous TID WC  . [COMPLETED] LORazepam  0.5 mg Oral Once  . metoprolol tartrate  12.5 mg Oral BID  . mirtazapine  15 mg Oral QHS  . pantoprazole  40 mg Oral Daily  . rOPINIRole  6 mg Oral QHS  . sodium chloride  3 mL Intravenous Q12H  . [DISCONTINUED] aspirin  81 mg Oral Daily  . [DISCONTINUED] ciprofloxacin  400 mg Intravenous Q12H   Infusions:  . sodium chloride 75 mL/hr at 02/18/13 1809  . heparin 550 Units/hr (02/18/13 2313)  . [DISCONTINUED] heparin 800 Units/hr (02/18/13 0228)  . [DISCONTINUED] heparin 500 Units/hr (02/18/13 1338)    Assessment:  79 yo admitted 3/13 c/o weakness.  Pt with history of DVT 1/13.  Now with D dimer=7.3, CT revealed multiple bilateral  PEs, LE dopplers cw/ bilateral DVTs.  Heparin level at low end  of therapeutic range (0.30)on 550 units/hr.   CBC improved, no complications of therapy noted.  Goal of Therapy:  Heparin level 0.3-0.7 units/ml Monitor platelets by anticoagulation protocol: Yes   Plan:   Increase heparin 650 units/hr  Recheck heparin level in 6hrs  Daily heparin level, CBC  Follow up plans for oral anticoagulation  Peggyann Juba, PharmD, BCPS Pager: [phone] 02/19/2013 7:13 AM

## 2013-02-19 NOTE — Evaluation (Signed)
Physical Therapy Evaluation Patient Details Name: [name] MRN: [id number] DOB: July 22, 1919 Today's Date: 02/19/2013 Time: [id number] PT Time Calculation (min): 29 min  PT Assessment / Plan / Recommendation Clinical Impression  79 yo female admitted with acute PEs and bil DVTs. Hx of DM, HTN, neuropathy, RBBB, sinus tachycardia. On eval pt requires mIn assist for mobility. Fatigued after BM and transfers back and forth so unable to assess gait on evalutation-will plan to on next session. Recommend HHPT vs SNF , depending on progress. Pt lives alone. Just completed stay at rehab ~ 2weeks ago. Pt would like to return home.     PT Assessment  Patient needs continued PT services    Follow Up Recommendations  Home health PT;SNF (depending on progress)    Does the patient have the potential to tolerate intense rehabilitation      Barriers to Discharge        Equipment Recommendations  None recommended by PT    Recommendations for Other Services OT consult   Frequency Min 3X/week    Precautions / Restrictions Precautions Precautions: Fall Restrictions Weight Bearing Restrictions: No   Pertinent Vitals/Pain During session, O2 sats 96% on RA, HR 72 bpm      Mobility  Bed Mobility Bed Mobility: Supine to Sit Supine to Sit: 4: Min assist Details for Bed Mobility Assistance: Assist for trunk to upright.  Transfers Transfers: Sit to Stand;Stand to [employer] Transfers Sit to Stand: 4: Min assist;From bed;From chair/3-in-1 Stand to Sit: 4: Min assist;To chair/3-in-1;To bed Stand Pivot Transfers: 4: Min assist Details for Transfer Assistance: 3 sit<>stands, 2 stand-pivot transfers in room. Assisted pt to [facility], then to recliner.  Ambulation/Gait Ambulation/Gait Assistance: Not tested (comment) Ambulation/Gait Assistance Details: Pt too fatigued after BM and transfers    Exercises     PT Diagnosis: Difficulty walking;Generalized weakness  PT Problem List: Decreased  strength;Decreased activity tolerance;Decreased balance;Decreased mobility PT Treatment Interventions: DME instruction;Gait training;Functional mobility training;Therapeutic activities;Therapeutic exercise;Patient/family education   PT Goals Acute Rehab PT Goals PT Goal Formulation: With patient Time For Goal Achievement: 02/26/13 Potential to Achieve Goals: Good Pt will go Supine/Side to Sit: with modified independence PT Goal: Supine/Side to Sit - Progress: Goal set today Pt will go Sit to Supine/Side: with modified independence PT Goal: Sit to Supine/Side - Progress: Goal set today Pt will go Sit to Stand: with modified independence PT Goal: Sit to Stand - Progress: Goal set today Pt will Ambulate: 51 - 150 feet;with supervision;with least restrictive assistive device PT Goal: Ambulate - Progress: Goal set today Pt will Go Up / Down Stairs: 3-5 stairs;with supervision;with rail(s);with least restrictive assistive device PT Goal: Up/Down Stairs - Progress: Goal set today  Visit Information  Last PT Received On: 02/19/13 Assistance Needed: +1    Subjective Data  Subjective: Im worn out Patient Stated Goal: home   Prior Functioning  Home Living Available Help at Discharge: Personal care attendant (3 hours every other day=-will assit with anything) Type of Home: Other (Comment) (condo) Home Access: Stairs to enter Entrance Stairs-Number of Steps: 5 spread out steps up to front door Entrance Stairs-Rails: Right;Left Home Adaptive Equipment: Walker - four wheeled;Straight cane;Bedside commode/3-in-1 Prior Function Level of Independence: Independent with assistive device(s);Needs assistance Needs Assistance: Meal Prep;Light Housekeeping;Bathing Bath: Supervision/set-up (for showers) Able to Take Stairs?: Yes Driving: No Communication Communication: HOH    Cognition  Cognition Overall Cognitive Status: Appears within functional limits for tasks  assessed/performed Arousal/Alertness: Awake/alert Orientation Level: Appears intact for tasks  assessed Behavior During Session: [facility] for tasks performed    Extremity/Trunk Assessment Right Lower Extremity Assessment RLE ROM/Strength/Tone: Deficits RLE ROM/Strength/Tone Deficits: Strength at least 3+/5 throughout Left Lower Extremity Assessment LLE ROM/Strength/Tone: Deficits LLE ROM/Strength/Tone Deficits: Strength at least 3+/5 throughout   Balance    End of Session PT - End of Session Activity Tolerance: Patient limited by fatigue Patient left: in chair;with call bell/phone within reach  GP     Weston Anna, MPT Pager: [phone]

## 2013-02-19 NOTE — Progress Notes (Signed)
ANTICOAGULATION CONSULT NOTE - Follow Up  Pharmacy Consult for Heparin Indication: PE, DVT  Allergies  Allergen Reactions  . Sulfa Antibiotics Nausea And Vomiting  . Penicillins Hives and Rash    Patient Measurements: Height: 5' (152.4 cm) Weight: 131 lb 3.2 oz (59.512 kg) IBW/kg (Calculated) : 45.5 Heparin Dosing Weight: 50 kg  Vital Signs: Temp: 97.4 F (36.3 C) (03/15 1414) Temp src: Oral (03/15 1414) BP: 138/67 mmHg (03/15 1414) Pulse Rate: 58 (03/15 1414)  Labs:  Recent Labs  02/17/13 1603 02/17/13 2245 02/18/13 0320  02/18/13 2224 02/19/13 0517 02/19/13 1521  HGB 11.1*  --  9.6*  --   --  10.9*  --   HCT 33.6*  --  30.6*  --   --  33.8*  --   PLT 175  --  158  --   --  194  --   APTT 29  --   --   --   --   --   --   LABPROT 13.7  --   --   --   --   --   --   INR 1.06  --   --   --   --   --   --   HEPARINUNFRC  --   --   --   < > 0.31 0.30 0.29*  CREATININE 1.01  --  0.92  --   --  0.89  --   TROPONINI 1.08* 0.69* 0.47*  --   --   --   --   < > = values in this interval not displayed.  Estimated Creatinine Clearance: 33.2 ml/min (by C-G formula based on Cr of 0.89).   Assessment:  79 yo admitted 3/13 c/o weakness.  Pt with history of DVT 1/13.  CT revealed multiple bilateral  PEs, LE dopplers c/w bilateral DVTs.  IV heparin started 3/14.   Heparin currently running at 650 units/hr and heparin level returns slightly low at 0.29 (level 6 hour after gtt increased).   CBC improved, no complications of therapy noted.  Goal of Therapy:  Heparin level 0.3-0.7 units/ml Monitor platelets by anticoagulation protocol: Yes   Plan:   Increase heparin slightly to 700 units/hr  Heparin level with AM labs  Daily heparin level, CBC  Follow up plans for oral anticoagulation  Vanessa Waldron, PharmD, BCPS Pager: [phone] Martin #: [phone]

## 2013-02-20 DIAGNOSIS — I2699 Other pulmonary embolism without acute cor pulmonale: Secondary | ICD-10-CM | POA: Diagnosis not present

## 2013-02-20 DIAGNOSIS — I82409 Acute embolism and thrombosis of unspecified deep veins of unspecified lower extremity: Secondary | ICD-10-CM | POA: Diagnosis not present

## 2013-02-20 DIAGNOSIS — R7989 Other specified abnormal findings of blood chemistry: Secondary | ICD-10-CM | POA: Diagnosis not present

## 2013-02-20 DIAGNOSIS — R5381 Other malaise: Secondary | ICD-10-CM | POA: Diagnosis not present

## 2013-02-20 LAB — CBC
Hemoglobin: 9.5 g/dL — ABNORMAL LOW (ref 12.0–15.0)
MCH: 28.7 pg (ref 26.0–34.0)
MCHC: 32 g/dL (ref 30.0–36.0)
MCV: 89.7 fL (ref 78.0–100.0)
Platelets: 173 10*3/uL (ref 150–400)
RBC: 3.31 MIL/uL — ABNORMAL LOW (ref 3.87–5.11)

## 2013-02-20 LAB — BASIC METABOLIC PANEL
BUN: 13 mg/dL (ref 6–23)
CO2: 23 mEq/L (ref 19–32)
Calcium: 8.2 mg/dL — ABNORMAL LOW (ref 8.4–10.5)
GFR calc non Af Amer: 59 mL/min — ABNORMAL LOW (ref >90)
Glucose, Bld: 94 mg/dL (ref 70–99)
Sodium: 141 mEq/L (ref 135–145)

## 2013-02-20 LAB — GLUCOSE, CAPILLARY
Glucose-Capillary: 117 mg/dL — ABNORMAL HIGH (ref 70–99)
Glucose-Capillary: 122 mg/dL — ABNORMAL HIGH (ref 70–99)
Glucose-Capillary: 144 mg/dL — ABNORMAL HIGH (ref 70–99)
Glucose-Capillary: 87 mg/dL (ref 70–99)

## 2013-02-20 LAB — URINE CULTURE

## 2013-02-20 MED ORDER — ENOXAPARIN SODIUM 60 MG/0.6ML ~~LOC~~ SOLN
60.0000 mg | Freq: Two times a day (BID) | SUBCUTANEOUS | Status: DC
  Administered 2013-02-20 – 2013-02-21 (×3): 60 mg via SUBCUTANEOUS
  Filled 2013-02-20 (×5): qty 0.6

## 2013-02-20 NOTE — Progress Notes (Signed)
[facility] PROGRESS NOTE  [name] [id number] DOB: 1918/12/12 DOA: 02/17/2013 PCP: Benito Mccreedy, MD  Assessment/Plan: #1 acute bilateral PE/bilateral lower extremity DVTs ?? Etiology.  ?? Malignancy .Patient had a left lower extremity DVT in January of 2013 was treated with anticoagulation and was off anticoagulation. Patient does have some mild shortness of breath. Patient is on IV heparin. Continue full dose Lovenox.  Lower extremity Dopplers consistent with bilateral lower extremity DVTs. 2-D echo with dilated mildly right ventricle a moderate to severe pulmonary hypertension consistent with PE.  Will consult with GI for further evaluation for ??malignancy.  #2 abnormal troponins Likely secondary to problem #1. Cardiac enzymes trending down. 2-D echo with mildly dilated right ventricle and moderate to severe pulmonary hypertension consistent with PE. Continue Lopressor and aspirin. Per cardiology.  #3 urinary tract infection Urine cultures are pending. Continue ciprofloxacin.  #4 shortness of breath/generalized weakness Likely secondary to problems #1 and 3.  #5 dehydration IV fluids.  #6 type 2 diabetes Hemoglobin A1c 7.3 on 02/01/2013. Continue sliding scale insulin.  #7 EKG changes Secondary to problem #1.  #8 restless leg syndrome Continue Requip.  #9 depression Stable.  #10 chronic pain Continue home regimen.  #11 prophylaxis PPI for GI prophylaxis. Heparin for DVT.  Code Status: DO NOT RESUSCITATE Family Communication: Updated patient no family at bedside. Disposition Plan: Home when medically stable.   Consultants:  Cardiology: Dr. Mare Ferrari 02/17/2013  Procedures:  CT angio chest 02/17/2013  2-D echo 02/18/2013  Lower extremity dopplers 02/18/13  Antibiotics:  IV ciprofloxacin 02/17/2013 --->02/19/13  Oral cipro 02/19/13  HPI/Subjective: Patient states shortness of breath improving. Patient denies any chest pain. Patient  tolerating diet.  Objective: Filed Vitals:   02/19/13 1414 02/19/13 2100 02/19/13 2225 02/20/13 0452  BP: 138/67 177/81 180/77 146/62  Pulse: 58 57 69 53  Temp: 97.4 F (36.3 C) 97.6 F (36.4 C) 97.7 F (36.5 C) 97.8 F (36.6 C)  TempSrc: Oral Oral Oral Oral  Resp: 14 22 20 20   Height:      Weight:    61.2 kg (134 lb 14.7 oz)  SpO2: 100% 99% 100% 94%    Intake/Output Summary (Last 24 hours) at 02/20/13 1137 Last data filed at 02/20/13 0952  Gross per 24 hour  Intake 2294.76 ml  Output    675 ml  Net 1619.76 ml   Filed Weights   02/17/13 1345 02/19/13 0611 02/20/13 0452  Weight: 57.153 kg (126 lb) 59.512 kg (131 lb 3.2 oz) 61.2 kg (134 lb 14.7 oz)    Exam:   General:  NAD  Cardiovascular: RRR. nO jvd, nO le EDEMA  Respiratory: CTAB  Abdomen: Soft/NT/ND/+BS  Data Reviewed: Basic Metabolic Panel:  Recent Labs Lab 02/17/13 1603 02/18/13 0320 02/19/13 0517 02/20/13 0524  NA 141 138 138 141  K 3.9 3.8 3.7 4.0  CL 103 105 105 110  CO2 27 25 23 23   GLUCOSE 104* 88 90 94  BUN 25* 22 16 13   CREATININE 1.01 0.92 0.89 0.84  CALCIUM 9.2 8.2* 8.5 8.2*  MG 1.5 2.4  --   --    Liver Function Tests:  Recent Labs Lab 02/17/13 1603  AST 24  ALT 15  ALKPHOS 103  BILITOT 0.4  PROT 6.1  ALBUMIN 3.2*   No results found for this basename: LIPASE, AMYLASE,  in the last 168 hours No results found for this basename: AMMONIA,  in the last 168 hours CBC:  Recent Labs Lab 02/17/13 1603 02/18/13 0320  02/19/13 0517 02/20/13 0524  WBC 6.1 4.2 4.9 4.7  NEUTROABS 4.0  --   --   --   HGB 11.1* 9.6* 10.9* 9.5*  HCT 33.6* 30.6* 33.8* 29.7*  MCV 89.1 90.0 89.4 89.7  PLT 175 158 194 173   Cardiac Enzymes:  Recent Labs Lab 02/17/13 1603 02/17/13 2245 02/18/13 0320  TROPONINI 1.08* 0.69* 0.47*   BNP (last 3 results) No results found for this basename: PROBNP,  in the last 8760 hours CBG:  Recent Labs Lab 02/19/13 0802 02/19/13 1250 02/19/13 1711  02/19/13 2249 02/20/13 0806  GLUCAP 85 91 87 101* 117*    Recent Results (from the past 240 hour(s))  URINE CULTURE     Status: None   Collection Time    02/17/13  6:17 PM      Result Value Range Status   Specimen Description URINE, CATHETERIZED   Final   Special Requests NONE   Final   Culture  Setup Time 02/18/2013 01:41   Final   Colony Count >=100,000 COLONIES/ML   Final   Culture GRAM NEGATIVE RODS   Final   Report Status PENDING   Incomplete     Studies: No results found.  Scheduled Meds: . ciprofloxacin  500 mg Oral BID  . docusate sodium  100 mg Oral BID  . enoxaparin (LOVENOX) injection  60 mg Subcutaneous Q12H  . feeding supplement  1 Container Oral BID BM  . gabapentin  100 mg Oral TID  . insulin aspart  0-9 Units Subcutaneous TID WC  . mirtazapine  15 mg Oral QHS  . pantoprazole  40 mg Oral Daily  . rOPINIRole  6 mg Oral QHS  . sodium chloride  3 mL Intravenous Q12H   Continuous Infusions:    Principal Problem:   Acute pulmonary embolism Active Problems:   HYPERLIPIDEMIA   RESTLESS LEG SYNDROME   HYPERTENSION   GERD   DUODENITIS   Polymyalgia rheumatica   FATIGUE   CROHN'S DISEASE, HX OF   Overactive bladder   Fall   UTI (urinary tract infection)   Weakness generalized   SOB (shortness of breath)   DVT, bilateral lower limbs    Time spent: > 35 mins    [facility] Pager [phone]. If 7PM-7AM, please contact night-coverage at [email or web address], password [facility] 02/20/2013, 11:37 AM  LOS: 3 days

## 2013-02-20 NOTE — Progress Notes (Signed)
ANTICOAGULATION CONSULT NOTE - Initial Consult  Pharmacy Consult for Lovenox Indication: pulmonary embolus  Allergies  Allergen Reactions  . Sulfa Antibiotics Nausea And Vomiting  . Penicillins Hives and Rash    Patient Measurements: Height: 5' (152.4 cm) Weight: 134 lb 14.7 oz (61.2 kg) IBW/kg (Calculated) : 45.5 Heparin Dosing Weight:   Vital Signs: Temp: 97.8 F (36.6 C) (03/16 0452) Temp src: Oral (03/16 0452) BP: 146/62 mmHg (03/16 0452) Pulse Rate: 53 (03/16 0452)  Labs:  Recent Labs  02/17/13 1603 02/17/13 2245 02/18/13 0320  02/19/13 0517 02/19/13 1521 02/20/13 0524  HGB 11.1*  --  9.6*  --  10.9*  --  9.5*  HCT 33.6*  --  30.6*  --  33.8*  --  29.7*  PLT 175  --  158  --  194  --  173  APTT 29  --   --   --   --   --   --   LABPROT 13.7  --   --   --   --   --   --   INR 1.06  --   --   --   --   --   --   HEPARINUNFRC  --   --   --   < > 0.30 0.29* 0.41  CREATININE 1.01  --  0.92  --  0.89  --  0.84  TROPONINI 1.08* 0.69* 0.47*  --   --   --   --   < > = values in this interval not displayed.  Estimated Creatinine Clearance: 35.7 ml/min (by C-G formula based on Cr of 0.84).   Medical History: Past Medical History  Diagnosis Date  . Cyst and pseudocyst of pancreas   . Unspecified essential hypertension   . Other and unspecified hyperlipidemia   . Polymyalgia rheumatica   . Personal history of unspecified digestive disease   . Restless legs syndrome (RLS)   . Insomnia, unspecified   . Other malaise and fatigue   . Diverticulosis of colon (without mention of hemorrhage)   . Acute gastritis without mention of hemorrhage   . Duodenitis without mention of hemorrhage   . Diaphragmatic hernia without mention of obstruction or gangrene   . Esophageal reflux   . Stricture and stenosis of esophagus   . OAB (overactive bladder)   . Left leg DVT jan 2013  . Unspecified sleep apnea     hx of sleep apnea, none since weight loss  . Diabetes mellitus      Medications:  Scheduled:  . ciprofloxacin  500 mg Oral BID  . docusate sodium  100 mg Oral BID  . enoxaparin (LOVENOX) injection  60 mg Subcutaneous Q12H  . feeding supplement  1 Container Oral BID BM  . gabapentin  100 mg Oral TID  . insulin aspart  0-9 Units Subcutaneous TID WC  . metoprolol tartrate  12.5 mg Oral BID  . mirtazapine  15 mg Oral QHS  . pantoprazole  40 mg Oral Daily  . rOPINIRole  6 mg Oral QHS  . sodium chloride  3 mL Intravenous Q12H   Infusions:  . sodium chloride 75 mL/hr at 02/19/13 2329  . [DISCONTINUED] heparin 650 Units/hr (02/19/13 0900)  . [DISCONTINUED] heparin 700 Units/hr (02/19/13 2013)   PRN: acetaminophen, acetaminophen, alum & mag hydroxide-simeth, bisacodyl, morphine injection, ondansetron (ZOFRAN) IV, ondansetron, oxyCODONE, polyethylene glycol  Assessment: 79 yo F with PE and bilateral lower extremity DVT's Goal of Therapy:  Anti-Xa level  0.6-1.2 units/ml 4hrs after LMWH dose given Monitor platelets by anticoagulation protocol: Yes   Plan:  Lovenox 60mg  sq q 12 hours,( starting 1 hour after heparin was discontinued)  Gypsy Decant 02/20/2013,8:19 AM

## 2013-02-21 DIAGNOSIS — R5383 Other fatigue: Secondary | ICD-10-CM | POA: Diagnosis not present

## 2013-02-21 DIAGNOSIS — R5381 Other malaise: Secondary | ICD-10-CM | POA: Diagnosis not present

## 2013-02-21 DIAGNOSIS — N390 Urinary tract infection, site not specified: Secondary | ICD-10-CM | POA: Diagnosis not present

## 2013-02-21 DIAGNOSIS — I2699 Other pulmonary embolism without acute cor pulmonale: Secondary | ICD-10-CM | POA: Diagnosis not present

## 2013-02-21 DIAGNOSIS — I82409 Acute embolism and thrombosis of unspecified deep veins of unspecified lower extremity: Secondary | ICD-10-CM | POA: Diagnosis not present

## 2013-02-21 LAB — GLUCOSE, CAPILLARY

## 2013-02-21 LAB — CBC
Platelets: 189 10*3/uL (ref 150–400)
RBC: 3.53 MIL/uL — ABNORMAL LOW (ref 3.87–5.11)
RDW: 16.3 % — ABNORMAL HIGH (ref 11.5–15.5)
WBC: 4 10*3/uL (ref 4.0–10.5)

## 2013-02-21 LAB — BASIC METABOLIC PANEL
Calcium: 8.6 mg/dL (ref 8.4–10.5)
Creatinine, Ser: 0.81 mg/dL (ref 0.50–1.10)
GFR calc Af Amer: 71 mL/min — ABNORMAL LOW (ref >90)
Sodium: 144 mEq/L (ref 135–145)

## 2013-02-21 MED ORDER — RIVAROXABAN 15 MG PO TABS
15.0000 mg | ORAL_TABLET | Freq: Two times a day (BID) | ORAL | Status: DC
  Administered 2013-02-21 – 2013-02-22 (×2): 15 mg via ORAL
  Filled 2013-02-21 (×4): qty 1

## 2013-02-21 MED ORDER — RIVAROXABAN 20 MG PO TABS: 20.0000 mg | ORAL_TABLET | Freq: Every day | ORAL | Status: DC

## 2013-02-21 MED ORDER — RIVAROXABAN 15 MG PO TABS
15.0000 mg | ORAL_TABLET | Freq: Two times a day (BID) | ORAL | Status: DC
  Filled 2013-02-21 (×2): qty 1

## 2013-02-21 NOTE — Progress Notes (Signed)
Physical Therapy Treatment Patient Details Name: [name] MRN: [id number] DOB: 1919/02/23 Today's Date: 02/21/2013 Time: [id number] PT Time Calculation (min): 25 min  PT Assessment / Plan / Recommendation Comments on Treatment Session  Pt ambulated in hallway and performed exercises today.    Follow Up Recommendations  Home health PT;Supervision/Assistance - 24 hour     Does the patient have the potential to tolerate intense rehabilitation     Barriers to Discharge        Equipment Recommendations  None recommended by PT    Recommendations for Other Services    Frequency     Plan Discharge plan remains appropriate;Frequency remains appropriate    Precautions / Restrictions Precautions Precautions: Fall   Pertinent Vitals/Pain SaO2 96% room air at rest SaO2 91-96% during ambulation    Mobility  Bed Mobility Bed Mobility: Supine to Sit;Sit to Supine Supine to Sit: 4: Min assist Sit to Supine: 5: Supervision Details for Bed Mobility Assistance: slight assist for trunk upright Transfers Transfers: Sit to Stand;Stand to Sit Sit to Stand: 4: Min assist;With upper extremity assist;From chair/3-in-1;From bed Stand to Sit: With upper extremity assist;4: Min guard;To chair/3-in-1;To bed Details for Transfer Assistance: assist to rise and steady once however min/guard thereafter Ambulation/Gait Ambulation/Gait Assistance: 4: Min guard Ambulation Distance (Feet): 240 Feet Assistive device: Rolling walker Ambulation/Gait Assistance Details: SaO2 91-96% room air during ambulation Gait Pattern: Step-through pattern;Trunk flexed Gait velocity: decreased    Exercises General Exercises - Lower Extremity Ankle Circles/Pumps: AROM;Both;15 reps Quad Sets: AROM;Both;15 reps Gluteal Sets: AROM;Both;15 reps Long Arc Quad: AROM;Both;15 reps Heel Slides: AROM;AAROM;Both;15 reps;Other (comment) (AAROM for R LE due to arthritic knee) Hip ABduction/ADduction: AROM;Both;15  reps Straight Leg Raises: AROM;Both;15 reps Hip Flexion/Marching: AROM;Left;15 reps;Limitations Hip Flexion/Marching Limitations: unable to perform on R per pt due to arthritic knee (states increases knee pain) Mini-Sqauts: AROM;Both;10 reps   PT Diagnosis:    PT Problem List:   PT Treatment Interventions:     PT Goals Acute Rehab PT Goals PT Goal: Supine/Side to Sit - Progress: Progressing toward goal PT Goal: Sit to Supine/Side - Progress: Progressing toward goal PT Goal: Sit to Stand - Progress: Progressing toward goal PT Goal: Ambulate - Progress: Progressing toward goal  Visit Information  Last PT Received On: 02/21/13 Assistance Needed: +1    Subjective Data  Subjective: I'll try but I need to use the restroom first.   Cognition  Cognition Overall Cognitive Status: Appears within functional limits for tasks assessed/performed Arousal/Alertness: Awake/alert Orientation Level: Appears intact for tasks assessed Behavior During Session: [facility] for tasks performed    Balance     End of Session PT - End of Session Activity Tolerance: Patient tolerated treatment well Patient left: in bed;with call bell/phone within reach;with bed alarm set   GP     Latorie Montesano,KATHrine E 02/21/2013, 1:33 PM Carmelia Bake, PT, DPT 02/21/2013 Pager: [phone]

## 2013-02-21 NOTE — Progress Notes (Signed)
[facility] PROGRESS NOTE  [name] ZMO:[id number] DOB: 1919/05/22 DOA: 02/17/2013 PCP: Benito Mccreedy, MD  Assessment/Plan: #1 acute bilateral PE/bilateral lower extremity DVTs ?? Etiology.  ?? Malignancy .Patient had a left lower extremity DVT in January of 2013 was treated with anticoagulation and was off anticoagulation. Patient does have some mild shortness of breath. Continue full dose Lovenox.  Lower extremity Dopplers consistent with bilateral lower extremity DVTs. 2-D echo with dilated mildly right ventricle a moderate to severe pulmonary hypertension consistent with PE.  Spoke with Dr. Collene Mares of gastroenterology who states patient had a colonoscopy done about a year ago which was only remarkable for diverticulosis. States patient has had an upper EEG D. which was negative. Per GI no further workup is needed at this time. Will start patient on xaralto.  #2 abnormal troponins Likely secondary to problem #1. Cardiac enzymes trending down. 2-D echo with mildly dilated right ventricle and moderate to severe pulmonary hypertension consistent with PE. Continue Lopressor and aspirin. Per cardiology.  #3 Klebsiella species urinary tract infection Continue ciprofloxacin.  #4 shortness of breath/generalized weakness Likely secondary to problems #1 and 3.  #5 dehydration IV fluids.  #6 type 2 diabetes Hemoglobin A1c 7.3 on 02/01/2013. Continue sliding scale insulin.  #7 EKG changes Secondary to problem #1.  #8 restless leg syndrome Continue Requip.  #9 depression Stable.  #10 chronic pain Continue home regimen.  #11 prophylaxis PPI for GI prophylaxis. Heparin for DVT.  Code Status: DO NOT RESUSCITATE Family Communication: Updated patient no family at bedside. Disposition Plan: Home when medically stable.   Consultants:  Cardiology: Dr. Mare Ferrari 02/17/2013  Procedures:  CT angio chest 02/17/2013  2-D echo 02/18/2013  Lower extremity dopplers  02/18/13  Antibiotics:  IV ciprofloxacin 02/17/2013 --->02/19/13  Oral cipro 02/19/13  HPI/Subjective: Patient states shortness of breath improving. Patient denies any chest pain. Patient tolerating diet.  Objective: Filed Vitals:   02/20/13 0452 02/20/13 1343 02/20/13 2110 02/21/13 0525  BP: 146/62 155/56 176/77 173/57  Pulse: 53 53 55 59  Temp: 97.8 F (36.6 C) 97.4 F (36.3 C) 98.4 F (36.9 C) 97.1 F (36.2 C)  TempSrc: Oral Oral Oral Oral  Resp: 20 18 16 16   Height:      Weight: 61.2 kg (134 lb 14.7 oz)     SpO2: 94% 97% 98% 94%    Intake/Output Summary (Last 24 hours) at 02/21/13 0954 Last data filed at 02/21/13 0825  Gross per 24 hour  Intake    840 ml  Output   1351 ml  Net   -511 ml   Filed Weights   02/17/13 1345 02/19/13 0611 02/20/13 0452  Weight: 57.153 kg (126 lb) 59.512 kg (131 lb 3.2 oz) 61.2 kg (134 lb 14.7 oz)    Exam:   General:  NAD  Cardiovascular: RRR. nO jvd, nO le EDEMA  Respiratory: CTAB  Abdomen: Soft/NT/ND/+BS  Data Reviewed: Basic Metabolic Panel:  Recent Labs Lab 02/17/13 1603 02/18/13 0320 02/19/13 0517 02/20/13 0524 02/21/13 0432  NA 141 138 138 141 144  K 3.9 3.8 3.7 4.0 3.9  CL 103 105 105 110 111  CO2 27 25 23 23 19   GLUCOSE 104* 88 90 94 97  BUN 25* 22 16 13 9   CREATININE 1.01 0.92 0.89 0.84 0.81  CALCIUM 9.2 8.2* 8.5 8.2* 8.6  MG 1.5 2.4  --   --   --    Liver Function Tests:  Recent Labs Lab 02/17/13 1603  AST 24  ALT 15  ALKPHOS 103  BILITOT 0.4  PROT 6.1  ALBUMIN 3.2*   No results found for this basename: LIPASE, AMYLASE,  in the last 168 hours No results found for this basename: AMMONIA,  in the last 168 hours CBC:  Recent Labs Lab 02/17/13 1603 02/18/13 0320 02/19/13 0517 02/20/13 0524 02/21/13 0432  WBC 6.1 4.2 4.9 4.7 4.0  NEUTROABS 4.0  --   --   --   --   HGB 11.1* 9.6* 10.9* 9.5* 10.2*  HCT 33.6* 30.6* 33.8* 29.7* 31.7*  MCV 89.1 90.0 89.4 89.7 89.8  PLT 175 158 194 173 189    Cardiac Enzymes:  Recent Labs Lab 02/17/13 1603 02/17/13 2245 02/18/13 0320  TROPONINI 1.08* 0.69* 0.47*   BNP (last 3 results) No results found for this basename: PROBNP,  in the last 8760 hours CBG:  Recent Labs Lab 02/20/13 0806 02/20/13 1159 02/20/13 1506 02/20/13 1644 02/20/13 2037  GLUCAP 117* 78 122* 144* 87    Recent Results (from the past 240 hour(s))  URINE CULTURE     Status: None   Collection Time    02/17/13  6:17 PM      Result Value Range Status   Specimen Description URINE, CATHETERIZED   Final   Special Requests NONE   Final   Culture  Setup Time 02/18/2013 01:41   Final   Colony Count >=100,000 COLONIES/ML   Final   Culture     Final   Value: KLEBSIELLA SPECIES     Note: Two isolates with different morphologies were identified as the same organism.The most resistant organism was reported.   Report Status 02/20/2013 FINAL   Final   Organism ID, Bacteria KLEBSIELLA SPECIES   Final     Studies: No results found.  Scheduled Meds: . ciprofloxacin  500 mg Oral BID  . docusate sodium  100 mg Oral BID  . enoxaparin (LOVENOX) injection  60 mg Subcutaneous Q12H  . feeding supplement  1 Container Oral BID BM  . gabapentin  100 mg Oral TID  . insulin aspart  0-9 Units Subcutaneous TID WC  . mirtazapine  15 mg Oral QHS  . pantoprazole  40 mg Oral Daily  . rOPINIRole  6 mg Oral QHS  . sodium chloride  3 mL Intravenous Q12H   Continuous Infusions:    Principal Problem:   Acute pulmonary embolism Active Problems:   HYPERLIPIDEMIA   RESTLESS LEG SYNDROME   HYPERTENSION   GERD   DUODENITIS   Polymyalgia rheumatica   FATIGUE   CROHN'S DISEASE, HX OF   Overactive bladder   Fall   UTI (urinary tract infection)   Weakness generalized   SOB (shortness of breath)   DVT, bilateral lower limbs    Time spent: > 35 mins    [facility] Pager [phone]. If 7PM-7AM, please contact night-coverage at [email or web address],  password [facility] 02/21/2013, 9:54 AM  LOS: 4 days

## 2013-02-21 NOTE — Care Management Note (Signed)
This is an active pt with [facility].  Pt will need resumption of care orders at discharge. Pt was receiving HHRN and [place],  Thank You Kirke Corin  [phone].

## 2013-02-21 NOTE — Progress Notes (Signed)
Notified Dr. Grandville Silos of patient elevated BP 168/78 HR-60, patient denies any distress, no new order given, will continue to assess patient.

## 2013-02-21 NOTE — Progress Notes (Signed)
Pt noted to be  very anxious last 2 nights.  I believe she would benefit from anti-anxiety/sedative.

## 2013-02-21 NOTE — Progress Notes (Signed)
Pre-activity oxygen saturation is 98%-RA while at rest, during ambulation oxygen sat down to 84%-RA, post ambulation oxygen saturation 97%-RA, patient alittle SOB, placed on 1liter of oxygen O2 sat 99%-1L

## 2013-02-21 NOTE — Progress Notes (Signed)
ANTICOAGULATION CONSULT NOTE - Initial Consult  Pharmacy Consult for Lovenox to Xarelto Indication: pulmonary embolus  Allergies  Allergen Reactions  . Sulfa Antibiotics Nausea And Vomiting  . Penicillins Hives and Rash    Patient Measurements: Height: 5' (152.4 cm) Weight: 134 lb 14.7 oz (61.2 kg) IBW/kg (Calculated) : 45.5 Heparin Dosing Weight:   Vital Signs: Temp: 97.3 F (36.3 C) (03/17 1427) Temp src: Oral (03/17 1427) BP: 168/78 mmHg (03/17 1427) Pulse Rate: 60 (03/17 1427)  Labs:  Recent Labs  02/19/13 0517 02/19/13 1521 02/20/13 0524 02/21/13 0432  HGB 10.9*  --  9.5* 10.2*  HCT 33.8*  --  29.7* 31.7*  PLT 194  --  173 189  HEPARINUNFRC 0.30 0.29* 0.41  --   CREATININE 0.89  --  0.84 0.81    Estimated Creatinine Clearance: 37 ml/min (by C-G formula based on Cr of 0.81).   Medical History: Past Medical History  Diagnosis Date  . Cyst and pseudocyst of pancreas   . Unspecified essential hypertension   . Other and unspecified hyperlipidemia   . Polymyalgia rheumatica   . Personal history of unspecified digestive disease   . Restless legs syndrome (RLS)   . Insomnia, unspecified   . Other malaise and fatigue   . Diverticulosis of colon (without mention of hemorrhage)   . Acute gastritis without mention of hemorrhage   . Duodenitis without mention of hemorrhage   . Diaphragmatic hernia without mention of obstruction or gangrene   . Esophageal reflux   . Stricture and stenosis of esophagus   . OAB (overactive bladder)   . Left leg DVT jan 2013  . Unspecified sleep apnea     hx of sleep apnea, none since weight loss  . Diabetes mellitus     Medications:  Scheduled:  . ciprofloxacin  500 mg Oral BID  . docusate sodium  100 mg Oral BID  . enoxaparin (LOVENOX) injection  60 mg Subcutaneous Q12H  . feeding supplement  1 Container Oral BID BM  . gabapentin  100 mg Oral TID  . insulin aspart  0-9 Units Subcutaneous TID WC  . mirtazapine  15 mg  Oral QHS  . pantoprazole  40 mg Oral Daily  . rOPINIRole  6 mg Oral QHS  . sodium chloride  3 mL Intravenous Q12H   Infusions:    PRN: acetaminophen, acetaminophen, alum & mag hydroxide-simeth, bisacodyl, morphine injection, ondansetron (ZOFRAN) IV, ondansetron, oxyCODONE, polyethylene glycol  Assessment: 80 yo F with PE and bilateral lower extremity DVT's, initially started on UFH gtt converted to SQ LMWH 3/16. Now orders to change to Xarelto.   CBC: Hgb=10.2, plts = 189 SCr = 0.81 for a CrCl of 20m/min  Goal of Therapy:  Monitor platelets by anticoagulation protocol: Yes   Plan:   CrCl is >30 and no drug interactions present, so Xarelto 15mbidwc x 3 weeks then 2051maily with evening meal appropriate  Give 1st dose at 1900, 2hr prior to next LMWH dose  Monitor for bleeding, risk of bleeding increased in elderly  [name]17/2014,2:31 PM

## 2013-02-22 DIAGNOSIS — N390 Urinary tract infection, site not specified: Secondary | ICD-10-CM | POA: Diagnosis not present

## 2013-02-22 DIAGNOSIS — I2699 Other pulmonary embolism without acute cor pulmonale: Secondary | ICD-10-CM | POA: Diagnosis not present

## 2013-02-22 DIAGNOSIS — I10 Essential (primary) hypertension: Secondary | ICD-10-CM | POA: Diagnosis not present

## 2013-02-22 DIAGNOSIS — I82409 Acute embolism and thrombosis of unspecified deep veins of unspecified lower extremity: Secondary | ICD-10-CM | POA: Diagnosis not present

## 2013-02-22 LAB — CBC
HCT: 34.4 % — ABNORMAL LOW (ref 36.0–46.0)
Hemoglobin: 10.8 g/dL — ABNORMAL LOW (ref 12.0–15.0)
MCHC: 31.4 g/dL (ref 30.0–36.0)
MCV: 90.5 fL (ref 78.0–100.0)
RDW: 16.2 % — ABNORMAL HIGH (ref 11.5–15.5)
WBC: 4.3 10*3/uL (ref 4.0–10.5)

## 2013-02-22 LAB — GLUCOSE, CAPILLARY: Glucose-Capillary: 105 mg/dL — ABNORMAL HIGH (ref 70–99)

## 2013-02-22 LAB — BASIC METABOLIC PANEL
BUN: 10 mg/dL (ref 6–23)
Chloride: 110 mEq/L (ref 96–112)
Creatinine, Ser: 0.86 mg/dL (ref 0.50–1.10)
GFR calc Af Amer: 66 mL/min — ABNORMAL LOW (ref >90)
Glucose, Bld: 113 mg/dL — ABNORMAL HIGH (ref 70–99)
Potassium: 3.8 mEq/L (ref 3.5–5.1)

## 2013-02-22 MED ORDER — CIPROFLOXACIN HCL 500 MG PO TABS: 500.0000 mg | ORAL_TABLET | Freq: Two times a day (BID) | ORAL | Status: AC

## 2013-02-22 MED ORDER — LOSARTAN POTASSIUM 25 MG PO TABS
25.0000 mg | ORAL_TABLET | Freq: Every day | ORAL | Status: DC
  Administered 2013-02-22: 25 mg via ORAL
  Filled 2013-02-22: qty 1

## 2013-02-22 MED ORDER — ENSURE PUDDING PO PUDG: 1.0000 | Freq: Two times a day (BID) | ORAL | Status: DC

## 2013-02-22 MED ORDER — RIVAROXABAN 20 MG PO TABS: 20.0000 mg | ORAL_TABLET | Freq: Every day | ORAL | Status: DC

## 2013-02-22 MED ORDER — RIVAROXABAN 15 MG PO TABS: 15.0000 mg | ORAL_TABLET | Freq: Two times a day (BID) | ORAL | Status: AC

## 2013-02-22 MED ORDER — POLYETHYLENE GLYCOL 3350 17 G PO PACK: 17.0000 g | PACK | Freq: Every day | ORAL | Status: DC | PRN

## 2013-02-22 MED ORDER — PANTOPRAZOLE SODIUM 40 MG PO TBEC: 40.0000 mg | DELAYED_RELEASE_TABLET | Freq: Every day | ORAL | Status: DC

## 2013-02-22 NOTE — Progress Notes (Signed)
ANTICOAGULATION CONSULT NOTE - Initial Consult  Pharmacy Consult for Lovenox to Xarelto Indication: pulmonary embolus  Allergies  Allergen Reactions  . Sulfa Antibiotics Nausea And Vomiting  . Penicillins Hives and Rash    Patient Measurements: Height: 5' (152.4 cm) Weight: 134 lb 0.6 oz (60.8 kg) IBW/kg (Calculated) : 45.5 Heparin Dosing Weight:   Vital Signs: Temp: 97.4 F (36.3 C) (03/18 0605) Temp src: Oral (03/18 0605) BP: 160/80 mmHg (03/18 0704) Pulse Rate: 72 (03/18 0605)  Labs:  Recent Labs  02/19/13 1521  02/20/13 0524 02/21/13 0432 02/22/13 0440  HGB  --   < > 9.5* 10.2* 10.8*  HCT  --   --  29.7* 31.7* 34.4*  PLT  --   --  173 189 208  HEPARINUNFRC 0.29*  --  0.41  --   --   CREATININE  --   --  0.84 0.81 0.86  < > = values in this interval not displayed.  Estimated Creatinine Clearance: 34.7 ml/min (by C-G formula based on Cr of 0.86).   Medical History: Past Medical History  Diagnosis Date  . Cyst and pseudocyst of pancreas   . Unspecified essential hypertension   . Other and unspecified hyperlipidemia   . Polymyalgia rheumatica   . Personal history of unspecified digestive disease   . Restless legs syndrome (RLS)   . Insomnia, unspecified   . Other malaise and fatigue   . Diverticulosis of colon (without mention of hemorrhage)   . Acute gastritis without mention of hemorrhage   . Duodenitis without mention of hemorrhage   . Diaphragmatic hernia without mention of obstruction or gangrene   . Esophageal reflux   . Stricture and stenosis of esophagus   . OAB (overactive bladder)   . Left leg DVT jan 2013  . Unspecified sleep apnea     hx of sleep apnea, none since weight loss  . Diabetes mellitus     Medications:  Scheduled:  . ciprofloxacin  500 mg Oral BID  . docusate sodium  100 mg Oral BID  . feeding supplement  1 Container Oral BID BM  . gabapentin  100 mg Oral TID  . insulin aspart  0-9 Units Subcutaneous TID WC  . losartan   25 mg Oral Daily  . mirtazapine  15 mg Oral QHS  . pantoprazole  40 mg Oral Daily  . [START ON 03/14/2013] rivaroxaban  20 mg Oral Q supper  . rivaroxaban  15 mg Oral BID WC  . rOPINIRole  6 mg Oral QHS  . sodium chloride  3 mL Intravenous Q12H  . [DISCONTINUED] enoxaparin (LOVENOX) injection  60 mg Subcutaneous Q12H  . [DISCONTINUED] rivaroxaban  15 mg Oral BID WC   Infusions:    PRN: acetaminophen, acetaminophen, alum & mag hydroxide-simeth, bisacodyl, morphine injection, ondansetron (ZOFRAN) IV, ondansetron, oxyCODONE, polyethylene glycol  Assessment: 79 yo F with PE and bilateral lower extremity DVT's, initially started on UFH gtt converted to SQ LMWH 3/16. Now orders to change to Xarelto.   CBC: Hgb=10.8, plts = 208 SCr = 0.86 for a CrCl of 47m/min  Goal of Therapy:  Monitor platelets by anticoagulation protocol: Yes   Plan:   Continue Xarelto 120mbidwc x 3 weeks then starting 4/7 begin 2045maily with evening meal  Xarelto education performed and questions answered  Monitor renal function and possible need to adjust anticoagulant therapy  Monitor for bleeding, risk of bleeding increased in elderly  [name]18/2014,10:12 AM

## 2013-02-22 NOTE — Progress Notes (Signed)
Physical Therapy Treatment Patient Details Name: [name] MRN: [id number] DOB: 1919/02/27 Today's Date: 02/22/2013 Time: [id number] PT Time Calculation (min): 16 min  PT Assessment / Plan / Recommendation Comments on Treatment Session  Pt ambulated in hallway with RW and SaO2 reading 75-81% room air during ambulation.  Discussed with pt increased likelihood of home O2 with physical activity upon d/c.  Pt also limited today by R knee pain.    Follow Up Recommendations  Home health PT;Supervision/Assistance - 24 hour     Does the patient have the potential to tolerate intense rehabilitation     Barriers to Discharge        Equipment Recommendations  None recommended by PT    Recommendations for Other Services    Frequency     Plan Discharge plan remains appropriate;Frequency remains appropriate    Precautions / Restrictions Precautions Precautions: Fall Restrictions Weight Bearing Restrictions: No   Pertinent Vitals/Pain See below RN notified of pain in R thigh and knee.    Mobility  Bed Mobility Bed Mobility: Supine to Sit Supine to Sit: 4: Min assist Details for Bed Mobility Assistance: slight assist for trunk upright Transfers Transfers: Sit to Stand;Stand to Sit Sit to Stand: With upper extremity assist;From bed;4: Min guard Stand to Sit: With upper extremity assist;4: Min guard;To chair/3-in-1 Details for Transfer Assistance: verbal cues for safe technique Ambulation/Gait Ambulation/Gait Assistance: 4: Min guard Ambulation Distance (Feet): 120 Feet Assistive device: Rolling walker Ambulation/Gait Assistance Details: SaO2 dropped to 75-81% on room air during ambulation, mostly 80% and increased to 94% on 2L O2, pt with limited distance due to increased R thigh and knee pain today (RN notified) Gait Pattern: Step-through pattern;Trunk flexed Gait velocity: decreased    Exercises     PT Diagnosis:    PT Problem List:   PT Treatment Interventions:     PT  Goals Acute Rehab PT Goals PT Goal: Supine/Side to Sit - Progress: Progressing toward goal PT Goal: Sit to Supine/Side - Progress: Progressing toward goal PT Goal: Sit to Stand - Progress: Progressing toward goal PT Goal: Ambulate - Progress: Progressing toward goal  Visit Information  Last PT Received On: 02/22/13 Assistance Needed: +1    Subjective Data  Subjective: I can't go any further, my knee hurts too much.   Cognition  Cognition Overall Cognitive Status: Appears within functional limits for tasks assessed/performed Arousal/Alertness: Awake/alert    Balance     End of Session PT - End of Session Activity Tolerance: Patient limited by pain Patient left: in chair;with call bell/phone within reach;with family/visitor present Nurse Communication: Mobility status;Patient requests pain meds   GP     [name],[name] 02/22/2013, 11:12 AM [name], PT, DPT 02/22/2013 Pager: [phone]

## 2013-02-22 NOTE — Progress Notes (Signed)
Rec'd order for home O2.  Patient has traditional Medicare and sats has to be documented exactly as stated below per Medicare Guidelines.     SATURATION QUALIFICATIONS: (This note is used to comply with regulatory documentation for home oxygen)  Patient Saturations on Room Air at Rest = ___%  Patient Saturations on Room Air while Ambulating = ___%  Patient Saturations on ___ Liters of oxygen while Ambulating = ___%  Please briefly explain why patient needs home oxygen:

## 2013-02-22 NOTE — Discharge Summary (Signed)
Physician Discharge Summary  [name] OTL:[id number] DOB: 07/09/1919 DOA: 02/17/2013  PCP: Benito Mccreedy, MD  Admit date: 02/17/2013 Discharge date: 02/22/2013  Time spent: 65 minutes  Recommendations for Outpatient Follow-up:  1. Follow up with OSEI-BONSU,GEORGE, MD in 1 week.   Discharge Diagnoses:  Principal Problem:   Acute pulmonary embolism Active Problems:   HYPERLIPIDEMIA   RESTLESS LEG SYNDROME   HYPERTENSION   GERD   DUODENITIS   Polymyalgia rheumatica   FATIGUE   CROHN'S DISEASE, HX OF   Overactive bladder   Fall   UTI (urinary tract infection)   Weakness generalized   SOB (shortness of breath)   DVT, bilateral lower limbs   Discharge Condition: Stable and improved.  Diet recommendation: Regular  Filed Weights   02/19/13 0611 02/20/13 0452 02/22/13 0500  Weight: 59.512 kg (131 lb 3.2 oz) 61.2 kg (134 lb 14.7 oz) 60.8 kg (134 lb 0.6 oz)    History of present illness:  [name] is a 79 y.o. female with past medical history of diabetes type 2, hypertension, or for neuropathy, prior history of DVT currently off anticoagulations who had presented to her PCPs office with a one-day history of worsening shortness of breath, generalized weakness to the point where she was unable to get out of bed and stand, an episode of emesis the night prior to admission. Patient denied any fevers, no chills, no chest pain, no nausea, no abdominal pain, no dysuria, no diarrhea, no constipation, no melena, no hematemesis, no hematochezia.  Per caretaker patient had some labored breathing this morning. Patient presented to PCPs office where labs were obtained including urinalysis was worsened 4 UTI. Per patient's PCP EKG which was done did show a new right bundle branch block and a left anterior fascicular block which was new with a sinus tachycardia.  Patient was sent to the hospital as a direct admission.   Hospital Course:  #1 acute bilateral PE/bilateral lower  extremity DVTs  Patient was admitted from PCPs office which is generalized weakness. During patient's workup cardiac enzymes were cycled and troponins came back elevated. Patient was also noted to have an EKG with new right bundle branch block and left anterior fascicular block with a sinus tachycardia. A d-dimer was obtained which came back elevated at 7.23. Patient subsequently underwent a CT angiogram of the chest which was positive for multiple bilateral pulmonary emboli and a dilated thoracic esophagus with questionable hiatal hernia. Patient was subsequently placed on IV heparin and followed. Patient had a prior left lower extremity DVT in January of 2013 was treated with anticoagulation and was off anticoagulation. Lower extremity Dopplers which were done also did show bilateral lower extremity DVTs. A 2-D echo was also obtained which showed dilated mildly right ventricle with moderate to severe pulmonary hypertension which was consistent with PE. Due to patient's multiple bilateral PEs and bilateral DVTs and her prior DVT a year earlier on those a concern for malignancy.  CT angiogram of the chest which was done was negative for any adenopathy or any masses. Patient's case was discussed with Dr. Collene Mares of gastroenterology who had stated that patient had a recent colonoscopy done a year ago which was only remarkable for diverticulosis. Dr. Collene Mares stated that she reviewed the patient's upper EGD which was only remarkable for duodenitis. It was felt by Dr. Collene Mares that patient did not require any further gastroenterology workup for malignancy. Patient was subsequently transitioned to full dose Lovenox and then to xarelto. Patient improved clinically during  the hospitalization however on ambulation she desaturated to 84% on room air and a such will be sent home on home oxygen. Patient will be discharged home in stable and improved condition on xarelto 15 mg twice daily x21 days and then 20 mg daily. Patient is to  followup with PCP one week post discharge.  #2 abnormal troponins  During patient's workup for her weakness cardiac enzymes were cycled which came back elevated. 2-D echo was obtained which showed mildly dilated right ventricle and moderate to severe pulmonary hypertension consistent with PE. Patient was initially started on Lopressor and aspirin. Cardiology consultation was obtained. It was felt no further cardiac workup was needed as patient's abnormal troponins were likely secondary to multiple bilateral PE. #3 Klebsiella species urinary tract infection  On admission urinalysis which was done was consistent with a UTI. Urine cultures which were obtained were positive for Klebsiella species. Patient was initially placed on IV ciprofloxacin until sensitivities returned and was sensitive to the quinolones. Patient was transitioned to oral Cipro ciprofloxacin which she tolerated. Patient will be discharged home on 3 more days of oral ciprofloxacin. Patient will followup with PCP as outpatient.  #4 shortness of breath/generalized weakness  Secondary to problems #1 and 3. See #1 and 3.  #5 dehydration  On admission patient was noted to be clinically dehydrated. Patient was hydrated with IV fluids and was euvolemic by day of discharge.    The rest of patient's chronic medical issues remained stable throughout the hospitalization and patient be discharged in stable and improved condition.     Procedures: CT angio chest 02/17/2013  2-D echo 02/18/2013  Lower extremity dopplers 02/18/13     Consultations: Cardiology: Dr. Mare Ferrari 02/17/2013   Discharge Exam: Filed Vitals:   02/21/13 2220 02/22/13 0500 02/22/13 0605 02/22/13 0704  BP: 158/64  183/78 160/80  Pulse:   72   Temp:   97.4 F (36.3 C)   TempSrc:   Oral   Resp:   20   Height:      Weight:  60.8 kg (134 lb 0.6 oz)    SpO2:   96%     General: NAD Cardiovascular: RRR Respiratory: CTAB  Discharge Instructions  Discharge  Orders   Future Orders Complete By Expires     Diet general  As directed     Discharge instructions  As directed     Comments:      Follow up with OSEI-BONSU,GEORGE, MD in 1 week.    Increase activity slowly  As directed         Medication List    TAKE these medications       ciprofloxacin 500 MG tablet  Commonly known as:  CIPRO  Take 1 tablet (500 mg total) by mouth 2 (two) times daily. Take for 3 days then stop.     cyanocobalamin 1000 MCG/ML injection  Commonly known as:  (VITAMIN B-12)  Inject 1,000 mcg into the muscle every 30 (thirty) days.     ergocalciferol 50000 UNITS capsule  Commonly known as:  VITAMIN D2  Take 50,000 Units by mouth 2 (two) times a week. Take twice weekly on Wednesday and Sunday     feeding supplement Pudg  Take 1 Container by mouth 2 (two) times daily between meals.     gabapentin 100 MG capsule  Commonly known as:  NEURONTIN  Take 100 mg by mouth 3 (three) times daily.     l-methylfolate-B6-B12 3-35-2 MG Tabs  Commonly known as:  METANX  Take 1 tablet by mouth 2 (two) times daily.     losartan 25 MG tablet  Commonly known as:  COZAAR  Take 25 mg by mouth daily.     metFORMIN 500 MG tablet  Commonly known as:  GLUCOPHAGE  Take 250 mg by mouth 2 (two) times daily with a meal.     mirtazapine 15 MG tablet  Commonly known as:  REMERON  Take 15 mg by mouth at bedtime.     oxyCODONE 5 MG immediate release tablet  Commonly known as:  Oxy IR/ROXICODONE  Take 1-2 tablets (5-10 mg total) by mouth every 6 (six) hours as needed (1 tablet for mild pain, 2 tablets for moderate to severe pain).     pantoprazole 40 MG tablet  Commonly known as:  PROTONIX  Take 1 tablet (40 mg total) by mouth daily.     polyethylene glycol packet  Commonly known as:  MIRALAX / GLYCOLAX  Take 17 g by mouth daily as needed.     REQUIP XL 6 MG Tb24  Generic drug:  Ropinirole HCl  Take 1 tablet by mouth every evening.     Rivaroxaban 15 MG Tabs tablet   Commonly known as:  XARELTO  Take 1 tablet (15 mg total) by mouth 2 (two) times daily with a meal.     Rivaroxaban 20 MG Tabs  Commonly known as:  XARELTO  Take 1 tablet (20 mg total) by mouth daily with supper.  Start taking on:  03/14/2013           Follow-up Information   Follow up with OSEI-BONSU,GEORGE, MD. Schedule an appointment as soon as possible for a visit in 1 week.   Contact information:   [address], [address] [place] [address] [phone]        The results of significant diagnostics from this hospitalization (including imaging, microbiology, ancillary and laboratory) are listed below for reference.    Significant Diagnostic Studies: Ct Angio Chest Pe W/cm &/or Wo Cm  02/18/2013  *RADIOLOGY REPORT*  Clinical Data: Elevated D-dimer, history diabetes, polymyalgia rheumatica, hypertension  CT ANGIOGRAPHY CHEST  Technique:  Multidetector CT imaging of the chest using the standard protocol during bolus administration of intravenous contrast. Multiplanar reconstructed images including MIPs were obtained and reviewed to evaluate the vascular anatomy.  Contrast: 142m OMNIPAQUE IOHEXOL 350 MG/ML SOLN  Comparison: None  Findings: Aorta normal caliber with scattered atherosclerotic calcifications. Scattered coronary arterial calcifications. Large filling defects are identified at the bifurcations of the left and right pulmonary arteries consistent with pulmonary embolism. Additional emboli are noted within bilateral lower lobes, lingula, and right upper lobe. Mildly dilated main pulmonary artery 3.4 cm transverse.  Dilated esophagus, partially fluid-filled, with probable moderate hiatal hernia versus distended esophagus. Visualized portion of upper abdomen unremarkable. No thoracic adenopathy. No pulmonary infiltrate, pleural effusion or pneumothorax. Bones appear demineralized.  IMPRESSION: Multiple bilateral pulmonary emboli. Mildly dilated main pulmonary artery 3.4 cm  transverse. Dilated thoracic esophagus with question hiatal hernia.  Critical Value/emergent results were called by telephone at the time of interpretation on 02/18/2013 at 0151 hours to [facility] 4th floor nursing unti, who verbally acknowledged these results.   Original Report Authenticated By: MLavonia Dana M.D.    Dg Chest Port 1 View  02/17/2013  *RADIOLOGY REPORT*  Clinical Data: Shortness of breath and weakness.  PORTABLE CHEST - 1 VIEW  Comparison: Chest x-ray 12/06/2012.  Findings: Lung volumes are normal.  No consolidative airspace disease.  No pleural effusions.  No evidence of pulmonary edema. Heart size is borderline to mildly enlarged (unchanged).  Moderate to large hiatal hernia. The patient is rotated to the right on today's exam, resulting in distortion of the mediastinal contours and reduced diagnostic sensitivity and specificity for mediastinal pathology.  Atherosclerosis in the thoracic aorta.  IMPRESSION: 1.  No radiographic evidence of acute cardiopulmonary disease. Allowing for slight differences in patient positioning, the radiographic appearance of the chest is essentially unchanged, as above.   Original Report Authenticated By: Vinnie Langton, M.D.     Microbiology: Recent Results (from the past 240 hour(s))  URINE CULTURE     Status: None   Collection Time    02/17/13  6:17 PM      Result Value Range Status   Specimen Description URINE, CATHETERIZED   Final   Special Requests NONE   Final   Culture  Setup Time 02/18/2013 01:41   Final   Colony Count >=100,000 COLONIES/ML   Final   Culture     Final   Value: KLEBSIELLA SPECIES     Note: Two isolates with different morphologies were identified as the same organism.The most resistant organism was reported.   Report Status 02/20/2013 FINAL   Final   Organism ID, Bacteria KLEBSIELLA SPECIES   Final     Labs: Basic Metabolic Panel:  Recent Labs Lab 02/17/13 1603 02/18/13 0320 02/19/13 0517 02/20/13 0524  02/21/13 0432 02/22/13 0440  NA 141 138 138 141 144 142  K 3.9 3.8 3.7 4.0 3.9 3.8  CL 103 105 105 110 111 110  CO2 27 25 23 23 19 23   GLUCOSE 104* 88 90 94 97 113*  BUN 25* 22 16 13 9 10   CREATININE 1.01 0.92 0.89 0.84 0.81 0.86  CALCIUM 9.2 8.2* 8.5 8.2* 8.6 8.9  MG 1.5 2.4  --   --   --   --    Liver Function Tests:  Recent Labs Lab 02/17/13 1603  AST 24  ALT 15  ALKPHOS 103  BILITOT 0.4  PROT 6.1  ALBUMIN 3.2*   No results found for this basename: LIPASE, AMYLASE,  in the last 168 hours No results found for this basename: AMMONIA,  in the last 168 hours CBC:  Recent Labs Lab 02/17/13 1603 02/18/13 0320 02/19/13 0517 02/20/13 0524 02/21/13 0432 02/22/13 0440  WBC 6.1 4.2 4.9 4.7 4.0 4.3  NEUTROABS 4.0  --   --   --   --   --   HGB 11.1* 9.6* 10.9* 9.5* 10.2* 10.8*  HCT 33.6* 30.6* 33.8* 29.7* 31.7* 34.4*  MCV 89.1 90.0 89.4 89.7 89.8 90.5  PLT 175 158 194 173 189 208   Cardiac Enzymes:  Recent Labs Lab 02/17/13 1603 02/17/13 2245 02/18/13 0320  TROPONINI 1.08* 0.69* 0.47*   BNP: BNP (last 3 results) No results found for this basename: PROBNP,  in the last 8760 hours CBG:  Recent Labs Lab 02/21/13 0727 02/21/13 1158 02/21/13 1650 02/22/13 0735 02/22/13 1154  GLUCAP 91 92 146* 105* 97       Signed:  [facility] 02/22/2013, 1:47 PM

## 2013-02-22 NOTE — Progress Notes (Signed)
Rec'd order for home O2. Patient has traditional Medicare and sats has to be documented exactly as stated below per Medicare Guidelines.  SATURATION QUALIFICATIONS: (This note is used to comply with regulatory documentation for home oxygen)  Patient Saturations on Room Air at Rest =96%  Patient Saturations on Room Air while Ambulating = 80%  Patient Saturations on _2__ Liters of oxygen while Ambulating = 94%  Please briefly explain why patient needs home oxygen:

## 2013-02-22 NOTE — Progress Notes (Signed)
MEDICAL NEED FOR HOME OXYGEN Patient had a left lower extremity DVT in January of 2013 was treated with anticoagulation and was off anticoagulation. Patient does have some mild shortness of breath. Continue full dose Lovenox. Lower extremity Dopplers consistent with bilateral lower extremity DVTs. 2-D echo with dilated mildly right ventricle a moderate to severe pulmonary hypertension consistent with PE. Patient with shortness of breath on ambulation need home oxygen for activities for daily living.  Per nurses notes, Pre-activity oxygen saturation is 98%-RA while at rest, during ambulation oxygen sat down to 84%-RA, post ambulation oxygen saturation 97%-RA, patient alittle SOB, placed on 1liter of oxygen O2 sat 99%-1L.

## 2013-02-24 DIAGNOSIS — K5090 Crohn's disease, unspecified, without complications: Secondary | ICD-10-CM | POA: Diagnosis not present

## 2013-02-24 DIAGNOSIS — M353 Polymyalgia rheumatica: Secondary | ICD-10-CM | POA: Diagnosis not present

## 2013-02-24 DIAGNOSIS — IMO0001 Reserved for inherently not codable concepts without codable children: Secondary | ICD-10-CM | POA: Diagnosis not present

## 2013-02-24 DIAGNOSIS — I10 Essential (primary) hypertension: Secondary | ICD-10-CM | POA: Diagnosis not present

## 2013-02-24 DIAGNOSIS — E119 Type 2 diabetes mellitus without complications: Secondary | ICD-10-CM | POA: Diagnosis not present

## 2013-02-28 DIAGNOSIS — F329 Major depressive disorder, single episode, unspecified: Secondary | ICD-10-CM | POA: Diagnosis not present

## 2013-02-28 DIAGNOSIS — I2699 Other pulmonary embolism without acute cor pulmonale: Secondary | ICD-10-CM | POA: Diagnosis not present

## 2013-02-28 DIAGNOSIS — I10 Essential (primary) hypertension: Secondary | ICD-10-CM | POA: Diagnosis not present

## 2013-02-28 DIAGNOSIS — F3289 Other specified depressive episodes: Secondary | ICD-10-CM | POA: Diagnosis not present

## 2013-02-28 DIAGNOSIS — G894 Chronic pain syndrome: Secondary | ICD-10-CM | POA: Diagnosis not present

## 2013-02-28 DIAGNOSIS — I824Z9 Acute embolism and thrombosis of unspecified deep veins of unspecified distal lower extremity: Secondary | ICD-10-CM | POA: Diagnosis not present

## 2013-02-28 DIAGNOSIS — E538 Deficiency of other specified B group vitamins: Secondary | ICD-10-CM | POA: Diagnosis not present

## 2013-02-28 DIAGNOSIS — E119 Type 2 diabetes mellitus without complications: Secondary | ICD-10-CM | POA: Diagnosis not present

## 2013-02-28 DIAGNOSIS — G609 Hereditary and idiopathic neuropathy, unspecified: Secondary | ICD-10-CM | POA: Diagnosis not present

## 2013-03-01 DIAGNOSIS — K5090 Crohn's disease, unspecified, without complications: Secondary | ICD-10-CM | POA: Diagnosis not present

## 2013-03-01 DIAGNOSIS — I10 Essential (primary) hypertension: Secondary | ICD-10-CM | POA: Diagnosis not present

## 2013-03-01 DIAGNOSIS — M353 Polymyalgia rheumatica: Secondary | ICD-10-CM | POA: Diagnosis not present

## 2013-03-01 DIAGNOSIS — IMO0001 Reserved for inherently not codable concepts without codable children: Secondary | ICD-10-CM | POA: Diagnosis not present

## 2013-03-01 DIAGNOSIS — E119 Type 2 diabetes mellitus without complications: Secondary | ICD-10-CM | POA: Diagnosis not present

## 2013-03-04 ENCOUNTER — Telehealth: Payer: Self-pay | Admitting: Hematology & Oncology

## 2013-03-04 DIAGNOSIS — E119 Type 2 diabetes mellitus without complications: Secondary | ICD-10-CM | POA: Diagnosis not present

## 2013-03-04 DIAGNOSIS — K5090 Crohn's disease, unspecified, without complications: Secondary | ICD-10-CM | POA: Diagnosis not present

## 2013-03-04 DIAGNOSIS — IMO0001 Reserved for inherently not codable concepts without codable children: Secondary | ICD-10-CM | POA: Diagnosis not present

## 2013-03-04 DIAGNOSIS — I10 Essential (primary) hypertension: Secondary | ICD-10-CM | POA: Diagnosis not present

## 2013-03-04 DIAGNOSIS — M353 Polymyalgia rheumatica: Secondary | ICD-10-CM | POA: Diagnosis not present

## 2013-03-04 NOTE — Telephone Encounter (Signed)
Pt aware of 4-24 appointment

## 2013-03-08 DIAGNOSIS — M353 Polymyalgia rheumatica: Secondary | ICD-10-CM | POA: Diagnosis not present

## 2013-03-08 DIAGNOSIS — E119 Type 2 diabetes mellitus without complications: Secondary | ICD-10-CM | POA: Diagnosis not present

## 2013-03-08 DIAGNOSIS — IMO0001 Reserved for inherently not codable concepts without codable children: Secondary | ICD-10-CM | POA: Diagnosis not present

## 2013-03-08 DIAGNOSIS — K5090 Crohn's disease, unspecified, without complications: Secondary | ICD-10-CM | POA: Diagnosis not present

## 2013-03-08 DIAGNOSIS — I10 Essential (primary) hypertension: Secondary | ICD-10-CM | POA: Diagnosis not present

## 2013-03-09 DIAGNOSIS — I2699 Other pulmonary embolism without acute cor pulmonale: Secondary | ICD-10-CM | POA: Diagnosis not present

## 2013-03-09 DIAGNOSIS — I82409 Acute embolism and thrombosis of unspecified deep veins of unspecified lower extremity: Secondary | ICD-10-CM | POA: Diagnosis not present

## 2013-03-09 DIAGNOSIS — E119 Type 2 diabetes mellitus without complications: Secondary | ICD-10-CM | POA: Diagnosis not present

## 2013-03-09 DIAGNOSIS — R269 Unspecified abnormalities of gait and mobility: Secondary | ICD-10-CM | POA: Diagnosis not present

## 2013-03-09 DIAGNOSIS — M353 Polymyalgia rheumatica: Secondary | ICD-10-CM | POA: Diagnosis not present

## 2013-03-10 DIAGNOSIS — M353 Polymyalgia rheumatica: Secondary | ICD-10-CM | POA: Diagnosis not present

## 2013-03-10 DIAGNOSIS — E119 Type 2 diabetes mellitus without complications: Secondary | ICD-10-CM | POA: Diagnosis not present

## 2013-03-10 DIAGNOSIS — I2699 Other pulmonary embolism without acute cor pulmonale: Secondary | ICD-10-CM | POA: Diagnosis not present

## 2013-03-10 DIAGNOSIS — I82409 Acute embolism and thrombosis of unspecified deep veins of unspecified lower extremity: Secondary | ICD-10-CM | POA: Diagnosis not present

## 2013-03-10 DIAGNOSIS — R269 Unspecified abnormalities of gait and mobility: Secondary | ICD-10-CM | POA: Diagnosis not present

## 2013-03-11 DIAGNOSIS — I82409 Acute embolism and thrombosis of unspecified deep veins of unspecified lower extremity: Secondary | ICD-10-CM | POA: Diagnosis not present

## 2013-03-11 DIAGNOSIS — I2699 Other pulmonary embolism without acute cor pulmonale: Secondary | ICD-10-CM | POA: Diagnosis not present

## 2013-03-11 DIAGNOSIS — E119 Type 2 diabetes mellitus without complications: Secondary | ICD-10-CM | POA: Diagnosis not present

## 2013-03-11 DIAGNOSIS — M353 Polymyalgia rheumatica: Secondary | ICD-10-CM | POA: Diagnosis not present

## 2013-03-11 DIAGNOSIS — R269 Unspecified abnormalities of gait and mobility: Secondary | ICD-10-CM | POA: Diagnosis not present

## 2013-03-15 DIAGNOSIS — E119 Type 2 diabetes mellitus without complications: Secondary | ICD-10-CM | POA: Diagnosis not present

## 2013-03-15 DIAGNOSIS — I82409 Acute embolism and thrombosis of unspecified deep veins of unspecified lower extremity: Secondary | ICD-10-CM | POA: Diagnosis not present

## 2013-03-15 DIAGNOSIS — R269 Unspecified abnormalities of gait and mobility: Secondary | ICD-10-CM | POA: Diagnosis not present

## 2013-03-15 DIAGNOSIS — M353 Polymyalgia rheumatica: Secondary | ICD-10-CM | POA: Diagnosis not present

## 2013-03-15 DIAGNOSIS — I2699 Other pulmonary embolism without acute cor pulmonale: Secondary | ICD-10-CM | POA: Diagnosis not present

## 2013-03-17 DIAGNOSIS — E119 Type 2 diabetes mellitus without complications: Secondary | ICD-10-CM | POA: Diagnosis not present

## 2013-03-17 DIAGNOSIS — M353 Polymyalgia rheumatica: Secondary | ICD-10-CM | POA: Diagnosis not present

## 2013-03-17 DIAGNOSIS — I82409 Acute embolism and thrombosis of unspecified deep veins of unspecified lower extremity: Secondary | ICD-10-CM | POA: Diagnosis not present

## 2013-03-17 DIAGNOSIS — I2699 Other pulmonary embolism without acute cor pulmonale: Secondary | ICD-10-CM | POA: Diagnosis not present

## 2013-03-17 DIAGNOSIS — R269 Unspecified abnormalities of gait and mobility: Secondary | ICD-10-CM | POA: Diagnosis not present

## 2013-03-22 DIAGNOSIS — E119 Type 2 diabetes mellitus without complications: Secondary | ICD-10-CM | POA: Diagnosis not present

## 2013-03-22 DIAGNOSIS — R269 Unspecified abnormalities of gait and mobility: Secondary | ICD-10-CM | POA: Diagnosis not present

## 2013-03-22 DIAGNOSIS — M353 Polymyalgia rheumatica: Secondary | ICD-10-CM | POA: Diagnosis not present

## 2013-03-22 DIAGNOSIS — I82409 Acute embolism and thrombosis of unspecified deep veins of unspecified lower extremity: Secondary | ICD-10-CM | POA: Diagnosis not present

## 2013-03-22 DIAGNOSIS — I2699 Other pulmonary embolism without acute cor pulmonale: Secondary | ICD-10-CM | POA: Diagnosis not present

## 2013-03-31 ENCOUNTER — Other Ambulatory Visit (HOSPITAL_BASED_OUTPATIENT_CLINIC_OR_DEPARTMENT_OTHER): Payer: Medicare Other | Admitting: Lab

## 2013-03-31 ENCOUNTER — Ambulatory Visit (HOSPITAL_BASED_OUTPATIENT_CLINIC_OR_DEPARTMENT_OTHER): Payer: Medicare Other | Admitting: Hematology & Oncology

## 2013-03-31 ENCOUNTER — Ambulatory Visit: Payer: Medicare Other

## 2013-03-31 VITALS — BP 92/64 | HR 90 | Temp 98.0°F | Resp 16 | Ht 61.0 in | Wt 127.0 lb

## 2013-03-31 DIAGNOSIS — E538 Deficiency of other specified B group vitamins: Secondary | ICD-10-CM | POA: Diagnosis not present

## 2013-03-31 DIAGNOSIS — I2699 Other pulmonary embolism without acute cor pulmonale: Secondary | ICD-10-CM

## 2013-03-31 DIAGNOSIS — I82403 Acute embolism and thrombosis of unspecified deep veins of lower extremity, bilateral: Secondary | ICD-10-CM

## 2013-03-31 DIAGNOSIS — I82409 Acute embolism and thrombosis of unspecified deep veins of unspecified lower extremity: Secondary | ICD-10-CM | POA: Diagnosis not present

## 2013-03-31 LAB — CBC WITH DIFFERENTIAL (CANCER CENTER ONLY)
BASO#: 0 10*3/uL (ref 0.0–0.2)
Eosinophils Absolute: 0.3 10*3/uL (ref 0.0–0.5)
HCT: 35 % (ref 34.8–46.6)
HGB: 11 g/dL — ABNORMAL LOW (ref 11.6–15.9)
LYMPH%: 35.4 % (ref 14.0–48.0)
MCH: 28.4 pg (ref 26.0–34.0)
MCV: 90 fL (ref 81–101)
MONO#: 0.4 10*3/uL (ref 0.1–0.9)
Platelets: 243 10*3/uL (ref 145–400)
RBC: 3.87 10*6/uL (ref 3.70–5.32)
WBC: 5.8 10*3/uL (ref 3.9–10.0)

## 2013-03-31 NOTE — Progress Notes (Signed)
This office note has been dictated.

## 2013-04-01 DIAGNOSIS — I2699 Other pulmonary embolism without acute cor pulmonale: Secondary | ICD-10-CM | POA: Diagnosis not present

## 2013-04-01 DIAGNOSIS — E119 Type 2 diabetes mellitus without complications: Secondary | ICD-10-CM | POA: Diagnosis not present

## 2013-04-01 DIAGNOSIS — I82409 Acute embolism and thrombosis of unspecified deep veins of unspecified lower extremity: Secondary | ICD-10-CM | POA: Diagnosis not present

## 2013-04-01 DIAGNOSIS — R269 Unspecified abnormalities of gait and mobility: Secondary | ICD-10-CM | POA: Diagnosis not present

## 2013-04-01 DIAGNOSIS — M353 Polymyalgia rheumatica: Secondary | ICD-10-CM | POA: Diagnosis not present

## 2013-04-01 NOTE — Progress Notes (Signed)
CC:   Benito Mccreedy, M.D.  DIAGNOSIS:  Bilateral deep vein thrombosis/pulmonary embolism.  HISTORY OF PRESENT ILLNESS:  [name] is a very nice 79 year old white female.  She is followed by Dr. Vista Lawman.  She has restless legs syndrome.  She has osteoarthritis.  She also has non-insulin-dependent diabetes.  She was admitted over to the [facility] hospital back in March.  She came in with shortness of breath.  She was found have an elevated, I think, troponin.  She then underwent a CT angiogram.  This was done on the 14th.  She had large filling defects in the bifurcation of the right and left pulmonary artery.  She had additional emboli noted within all lobes.  She then underwent lower extremity Dopplers.  This, not surprisingly, showed that she had DVT involving the right and left legs.  She was started on anticoagulation in the hospital.  She was then placed onto Xarelto.  Of note, going back to January 2013, she was noted to have an acute DVT in the left leg.  She thinks that she was on blood thinner, but could not tell me how long she was on.  She had a followup Doppler in December 2013 and everything looked fine with no evidence of residual thrombus.  She did not have any hypercoagulable studies done in the hospital.  She was found have an elevated CA19-9 of 38.  She was evaluated by GI.  Dr. Collene Mares of Gastroenterology saw her.  I think an upper endoscopy was done and everything turned out okay.  She was seen by Cardiology.  There was some question of her having a non- ST elevated MI.  I am was not sure if this really "played out."  She had a colonoscopy about a year ago and this just showed some diverticulosis.  She was discharged on Xarelto.  She has done okay with Xarelto.  She was kindly referred to the [facility] by Dr. Vista Lawman so that we could see if there is any underlying hypercoagulable state.  She feels okay right now.  She lives  with an aide.  She seems to be getting around all right.  She is not complaining of any shortness of breath.  She has chronic leg swelling.  She has had no bleeding.  There has been no change in bowel or bladder habits.  She has had no headache.  Again, we are asked to see her for this significant thromboembolic event.  PAST MEDICAL HISTORY: 1. Remarkable for DVT of the left leg back in January 2013.  Again,     she was on anticoagulation, but she is not sure long she was on. 2. Hypertension. 3. Osteoarthritis. 4. Restless legs syndrome. 5. Peripheral neuropathy. 6. Non-insulin dependent diabetes. 7. Depression. 8. Vitamin B12 deficiency.  ALLERGIES:  Sulfas and penicillins.  MEDICATIONS:  Neurontin 100 mg p.o. t.i.d., Cozaar 25 mg p.o. daily, metformin 250 mg p.o. b.i.d., oxycodone 5-10 mg p.o. q.6 hours p.r.n. for pain, Xarelto 20 mg p.o. daily, Requip XL 6 mg p.o. daily, Protonix 40 mg p.o. daily, Remeron 15 mg p.o. q.h.s.  SOCIAL HISTORY:  Negative for tobacco use.  There is no alcohol use. She has no obvious occupational exposures.  She used to work as an, I think, Futures trader.  FAMILY HISTORY:  Negative for any thromboembolic events.  She did have a stillborn child.  She has another child who passed away from cancer, but was also mentally handicapped.  She has a son  who has had a "cancer" for, I think, 23 years.  He is being treated at [facility].  REVIEW OF SYSTEMS:  As stated in the history present illness.  No additional findings noted on 12-system review.  PHYSICAL EXAMINATION:  General:  This is an elderly, somewhat petite white female in no obvious distress.  Vital signs:  Temperature of 98, pulse 90, respiratory rate 16, blood pressure 92/64.  Weight is 127. Head and neck:  Normocephalic, atraumatic skull.  There are no ocular or oral lesions.  There are no palpable cervical or supraclavicular lymph nodes.  Lungs:  Clear to percussion and auscultation  bilaterally. Cardiac:  Regular rate and rhythm with a normal S1 and S2.  She may have an extra systolic beat.  She has a 1/6 systolic ejection murmur. Abdomen:  Soft.  She has good bowel sounds.  There is no fluid wave. There is no palpable hepatosplenomegaly.  Back:  Some kyphosis.  There is no tenderness over the spine, ribs, or hips.  Extremities:  Some chronic nonpitting edema of the legs.  No obvious venous cords are noted in the lower legs.  Skin:  Macular type lesion on her chin.  LABORATORY STUDIES:  White cell count is 5.8, hemoglobin 11, hematocrit 35.0, platelet count 243.  MCV is 90.  IMPRESSION:  [name] is a very nice 79 year old white female with a significant thromboembolic event.  She had a large burden pulmonary embolism.  This was at the bifurcation of the left and right pulmonary artery.  There is no comment by the radiologist as to whether or not this was a saddle embolus.  I would sincerely doubt that we would find a hypercoagulable condition. At 79 years old, I would think that any thrombophilic state would have been found a long time ago and that she would have had a thromboembolic event a long time ago.  I do find it interesting that she had a DVT last year.  Again, she is not sure how long she was on blood thinner for this.  In my mind, I think she is going to need lifelong anticoagulation.  For some reason, she is showing [place] that she is hypercoagulable.  I just do not think that an exhaustive "hunt" for a hypercoagulable state is indicated.  It is not going to change our management of her.  I do want to get her back in 3 months' time.  I will get Dopplers of her legs to see if her thrombi have resolved.  I do not think we need a CT angiogram.  I think the Dopplers of the legs will show [place] what is happening.  I spent a good hour with [name].  She is incredibly charming.  It was fun talking to her.    ______________________________ Volanda Napoleon, M.D. PRE/MEDQ  D:  03/31/2013  T:  04/01/2013  Job:  [id number]

## 2013-04-04 LAB — HYPERCOAGULABLE PANEL, COMPREHENSIVE
AntiThromb III Func: 118 % (ref 76–126)
Anticardiolipin IgG: 8 GPL U/mL (ref <23)
Beta-2 Glyco I IgG: 0 G Units (ref <20)
Beta-2-Glycoprotein I IgA: 0 A Units (ref <20)
Beta-2-Glycoprotein I IgM: 7 M Units (ref <20)
Lupus Anticoagulant: NOT DETECTED
Protein C Activity: 141 % — ABNORMAL HIGH (ref 75–133)
Protein C, Total: 99 % (ref 72–160)
Protein S Total: 83 % (ref 60–150)

## 2013-04-07 ENCOUNTER — Telehealth: Payer: Self-pay | Admitting: *Deleted

## 2013-04-07 NOTE — Telephone Encounter (Signed)
Message copied by Rico Ala on Thu Apr 07, 2013 11:09 AM ------      Message from: Burney Gauze R      Created: Thu Apr 07, 2013  6:51 AM       Call - No obvious blood clotting condition!!  Laurey Arrow ------

## 2013-04-07 NOTE — Telephone Encounter (Signed)
Called patient to let her know that there is no obvious blood clotting condition noted

## 2013-04-14 ENCOUNTER — Telehealth: Payer: Self-pay | Admitting: Hematology & Oncology

## 2013-04-14 NOTE — Telephone Encounter (Signed)
Left pt message to call about 7-17 appointments

## 2013-04-18 DIAGNOSIS — M545 Low back pain, unspecified: Secondary | ICD-10-CM | POA: Diagnosis not present

## 2013-04-18 DIAGNOSIS — M542 Cervicalgia: Secondary | ICD-10-CM | POA: Diagnosis not present

## 2013-04-19 DIAGNOSIS — M545 Low back pain, unspecified: Secondary | ICD-10-CM | POA: Diagnosis not present

## 2013-04-19 DIAGNOSIS — Z006 Encounter for examination for normal comparison and control in clinical research program: Secondary | ICD-10-CM | POA: Diagnosis not present

## 2013-04-19 DIAGNOSIS — M542 Cervicalgia: Secondary | ICD-10-CM | POA: Diagnosis not present

## 2013-04-19 DIAGNOSIS — M48061 Spinal stenosis, lumbar region without neurogenic claudication: Secondary | ICD-10-CM | POA: Diagnosis not present

## 2013-04-20 ENCOUNTER — Emergency Department (HOSPITAL_COMMUNITY): Payer: Medicare Other

## 2013-04-20 ENCOUNTER — Encounter (HOSPITAL_COMMUNITY): Payer: Self-pay | Admitting: Emergency Medicine

## 2013-04-20 ENCOUNTER — Emergency Department (HOSPITAL_COMMUNITY)
Admission: EM | Admit: 2013-04-20 | Discharge: 2013-04-21 | Disposition: A | Discharge status: Home / Self Care | Payer: Medicare Other | Attending: Emergency Medicine | Admitting: Emergency Medicine

## 2013-04-20 DIAGNOSIS — R404 Transient alteration of awareness: Secondary | ICD-10-CM | POA: Diagnosis not present

## 2013-04-20 DIAGNOSIS — G609 Hereditary and idiopathic neuropathy, unspecified: Secondary | ICD-10-CM | POA: Diagnosis not present

## 2013-04-20 DIAGNOSIS — G2581 Restless legs syndrome: Secondary | ICD-10-CM | POA: Diagnosis not present

## 2013-04-20 DIAGNOSIS — J984 Other disorders of lung: Secondary | ICD-10-CM | POA: Diagnosis not present

## 2013-04-20 DIAGNOSIS — E785 Hyperlipidemia, unspecified: Secondary | ICD-10-CM | POA: Diagnosis not present

## 2013-04-20 DIAGNOSIS — Z8739 Personal history of other diseases of the musculoskeletal system and connective tissue: Secondary | ICD-10-CM | POA: Insufficient documentation

## 2013-04-20 DIAGNOSIS — Z79899 Other long term (current) drug therapy: Secondary | ICD-10-CM | POA: Insufficient documentation

## 2013-04-20 DIAGNOSIS — R55 Syncope and collapse: Secondary | ICD-10-CM | POA: Diagnosis not present

## 2013-04-20 DIAGNOSIS — Z8719 Personal history of other diseases of the digestive system: Secondary | ICD-10-CM | POA: Insufficient documentation

## 2013-04-20 DIAGNOSIS — I2699 Other pulmonary embolism without acute cor pulmonale: Secondary | ICD-10-CM | POA: Diagnosis not present

## 2013-04-20 DIAGNOSIS — I10 Essential (primary) hypertension: Secondary | ICD-10-CM | POA: Insufficient documentation

## 2013-04-20 DIAGNOSIS — I824Z9 Acute embolism and thrombosis of unspecified deep veins of unspecified distal lower extremity: Secondary | ICD-10-CM | POA: Diagnosis not present

## 2013-04-20 DIAGNOSIS — I2510 Atherosclerotic heart disease of native coronary artery without angina pectoris: Secondary | ICD-10-CM | POA: Diagnosis not present

## 2013-04-20 DIAGNOSIS — E119 Type 2 diabetes mellitus without complications: Secondary | ICD-10-CM | POA: Diagnosis not present

## 2013-04-20 DIAGNOSIS — G894 Chronic pain syndrome: Secondary | ICD-10-CM | POA: Diagnosis not present

## 2013-04-20 DIAGNOSIS — Z880 Allergy status to penicillin: Secondary | ICD-10-CM | POA: Insufficient documentation

## 2013-04-20 DIAGNOSIS — F3289 Other specified depressive episodes: Secondary | ICD-10-CM | POA: Diagnosis not present

## 2013-04-20 DIAGNOSIS — E538 Deficiency of other specified B group vitamins: Secondary | ICD-10-CM | POA: Diagnosis not present

## 2013-04-20 DIAGNOSIS — F329 Major depressive disorder, single episode, unspecified: Secondary | ICD-10-CM | POA: Diagnosis not present

## 2013-04-20 LAB — POCT I-STAT TROPONIN I: Troponin i, poc: 0 ng/mL (ref 0.00–0.08)

## 2013-04-20 LAB — POCT I-STAT, CHEM 8
BUN: 20 mg/dL (ref 6–23)
Calcium, Ion: 1.19 mmol/L (ref 1.13–1.30)
Chloride: 107 mEq/L (ref 96–112)
HCT: 27 % — ABNORMAL LOW (ref 36.0–46.0)
Sodium: 141 mEq/L (ref 135–145)
TCO2: 24 mmol/L (ref 0–100)

## 2013-04-20 LAB — CBC
HCT: 29.2 % — ABNORMAL LOW (ref 36.0–46.0)
Hemoglobin: 9.3 g/dL — ABNORMAL LOW (ref 12.0–15.0)
MCV: 88.5 fL (ref 78.0–100.0)
RBC: 3.3 MIL/uL — ABNORMAL LOW (ref 3.87–5.11)
WBC: 6 10*3/uL (ref 4.0–10.5)

## 2013-04-20 LAB — URINALYSIS, ROUTINE W REFLEX MICROSCOPIC
Bilirubin Urine: NEGATIVE
Glucose, UA: NEGATIVE mg/dL
Ketones, ur: NEGATIVE mg/dL
Leukocytes, UA: NEGATIVE
Specific Gravity, Urine: 1.025 (ref 1.005–1.030)
pH: 6 (ref 5.0–8.0)

## 2013-04-20 LAB — BASIC METABOLIC PANEL
BUN: 18 mg/dL (ref 6–23)
CO2: 23 mEq/L (ref 19–32)
Chloride: 104 mEq/L (ref 96–112)
Creatinine, Ser: 0.86 mg/dL (ref 0.50–1.10)
Glucose, Bld: 189 mg/dL — ABNORMAL HIGH (ref 70–99)
Potassium: 3.9 mEq/L (ref 3.5–5.1)

## 2013-04-20 LAB — APTT: aPTT: 33 seconds (ref 24–37)

## 2013-04-20 LAB — PROTIME-INR: INR: 1.21 (ref 0.00–1.49)

## 2013-04-20 MED ORDER — IOHEXOL 350 MG/ML SOLN
100.0000 mL | Freq: Once | INTRAVENOUS | Status: AC | PRN
  Administered 2013-04-20: 100 mL via INTRAVENOUS

## 2013-04-20 NOTE — ED Notes (Signed)
MD at bedside completing occult stool card.

## 2013-04-20 NOTE — ED Provider Notes (Signed)
History     CSN: [id number]  Arrival date & time 04/20/13  1924   First MD Initiated Contact with Patient 04/20/13 1951      Chief Complaint  Patient presents with  . Loss of Consciousness    (Consider location/radiation/quality/duration/timing/severity/associated sxs/prior treatment) Patient is a 79 y.o. female presenting with syncope. The history is provided by the patient.  Loss of Consciousness  This is a new problem. Pertinent negatives include abdominal pain, back pain, chest pain, headaches, nausea, vomiting and weakness.   patient was at home in a wheelchair and reportedly passed out. Patient states he doesn't know what happened but then woke up on the floor. Per EMS report she was unresponsive for 3-4 minutes. Patient states she has been having some right shoulder pain and neck pain and was started on a muscle relaxer by her primary care Dr. Patient states she feels somewhat better after fluids. She was hypotensive upon EMS arrival it is improved somewhat with IV fluids. She has a history of pulmonary embolisms and has been off her Zaroxolyn for the last week. No blood in stool. Patient states she's been feeling a little bad last couple days. Chest pain. No abdominal pain. No cough. No dysuria. No frequency. No lightheadedness. No blood in stool.  Past Medical History  Diagnosis Date  . Cyst and pseudocyst of pancreas   . Unspecified essential hypertension   . Other and unspecified hyperlipidemia   . Polymyalgia rheumatica   . Personal history of unspecified digestive disease   . Restless legs syndrome (RLS)   . Insomnia, unspecified   . Other malaise and fatigue   . Diverticulosis of colon (without mention of hemorrhage)   . Acute gastritis without mention of hemorrhage   . Duodenitis without mention of hemorrhage   . Diaphragmatic hernia without mention of obstruction or gangrene   . Esophageal reflux   . Stricture and stenosis of esophagus   . OAB (overactive bladder)    . Left leg DVT jan 2013  . Unspecified sleep apnea     hx of sleep apnea, none since weight loss  . Diabetes mellitus     Past Surgical History  Procedure Laterality Date  . Oophorectomy  1944    not sure which ovary  . Rotator cuff repair  right     4-5 yrs ago  . Eye surgery      cataracts removed-bilateral eyes  . Back surgery  2009    lower back  . Colon surgery  June 17, 2012    surgery for bleed  . Shoulder open rotator cuff repair  08/04/2012    Procedure: ROTATOR CUFF REPAIR SHOULDER OPEN;  Surgeon: Tobi Bastos, MD;  Location: [facility];  Service: Orthopedics;  Laterality: Left;  with Anchors and Graft    Family History  Problem Relation Age of Onset  . Melanoma Daughter     died age 85  . Melanoma Son     History  Substance Use Topics  . Smoking status: Never Smoker   . Smokeless tobacco: Never Used  . Alcohol Use: No    OB History   Grav Para Term Preterm Abortions TAB SAB Ect Mult Living                  Review of Systems  Constitutional: Negative for activity change and appetite change.  HENT: Positive for neck pain. Negative for neck stiffness.   Eyes: Negative for pain.  Respiratory: Negative for chest tightness  and shortness of breath.   Cardiovascular: Positive for syncope. Negative for chest pain and leg swelling.  Gastrointestinal: Negative for nausea, vomiting, abdominal pain and diarrhea.  Genitourinary: Negative for flank pain.  Musculoskeletal: Negative for back pain.  Skin: Negative for rash.  Neurological: Positive for syncope. Negative for weakness, numbness and headaches.  Psychiatric/Behavioral: Negative for behavioral problems.    Allergies  Sulfa antibiotics and Penicillins  Home Medications   Current Outpatient Rx  Name  Route  Sig  Dispense  Refill  . cyanocobalamin (,VITAMIN B-12,) 1000 MCG/ML injection   Intramuscular   Inject 1,000 mcg into the muscle every 30 (thirty) days.         . ergocalciferol (VITAMIN D2)  50000 UNITS capsule   Oral   Take 50,000 Units by mouth 2 (two) times a week. Wednesday and Sunday         . gabapentin (NEURONTIN) 100 MG capsule   Oral   Take 100 mg by mouth 3 (three) times daily.         Marland Kitchen l-methylfolate-B6-B12 (METANX) 3-35-2 MG TABS   Oral   Take 1 tablet by mouth 2 (two) times daily.         Marland Kitchen losartan (COZAAR) 25 MG tablet   Oral   Take 25 mg by mouth daily.         . metFORMIN (GLUCOPHAGE) 500 MG tablet   Oral   Take 500 mg by mouth daily with breakfast.          . mirtazapine (REMERON) 15 MG tablet   Oral   Take 15 mg by mouth at bedtime.         Marland Kitchen oxyCODONE (OXY IR/ROXICODONE) 5 MG immediate release tablet   Oral   Take 5-10 mg by mouth every 6 (six) hours as needed for pain.         . pantoprazole (PROTONIX) 40 MG tablet   Oral   Take 1 tablet (40 mg total) by mouth daily.   30 tablet   0   . Rivaroxaban (XARELTO) 20 MG TABS   Oral   Take 1 tablet (20 mg total) by mouth daily with supper.   30 tablet   0   . Ropinirole HCl (REQUIP XL) 6 MG TB24   Oral   Take 1 tablet by mouth every evening.         Marland Kitchen tiZANidine (ZANAFLEX) 2 MG tablet   Oral   Take 2-4 mg by mouth every 8 (eight) hours as needed (for muscle pain).           BP 113/67  Pulse 62  Temp(Src) 97.7 F (36.5 C) (Oral)  Resp 16  SpO2 99%  Physical Exam  Nursing note and vitals reviewed. Constitutional: She is oriented to person, place, and time. She appears well-developed and well-nourished.  HENT:  Head: Normocephalic and atraumatic.  Eyes: EOM are normal. Pupils are equal, round, and reactive to light.  Neck: Normal range of motion. Neck supple.  Cardiovascular: Normal rate, regular rhythm and normal heart sounds.   No murmur heard. Pulmonary/Chest: Effort normal and breath sounds normal. No respiratory distress. She has no wheezes. She has no rales.  Abdominal: Soft. Bowel sounds are normal. She exhibits no distension. There is no tenderness.  There is no rebound and no guarding.  Musculoskeletal: Normal range of motion.  Neurological: She is alert and oriented to person, place, and time. No cranial nerve deficit.  Skin: Skin is warm and dry.  Psychiatric: She has a normal mood and affect. Her speech is normal.    ED Course  Procedures (including critical care time)  Labs Reviewed  CBC - Abnormal; Notable for the following:    RBC 3.30 (*)    Hemoglobin 9.3 (*)    HCT 29.2 (*)    All other components within normal limits  BASIC METABOLIC PANEL - Abnormal; Notable for the following:    Glucose, Bld 189 (*)    GFR calc non Af Amer 57 (*)    GFR calc Af Amer 66 (*)    All other components within normal limits  POCT I-STAT, CHEM 8 - Abnormal; Notable for the following:    Glucose, Bld 187 (*)    Hemoglobin 9.2 (*)    HCT 27.0 (*)    All other components within normal limits  URINALYSIS, ROUTINE W REFLEX MICROSCOPIC  PROTIME-INR  APTT  POCT I-STAT TROPONIN I  OCCULT BLOOD, POC DEVICE   Dg Chest 2 View  04/20/2013   **ADDENDUM** CREATED: 04/20/2013 20:42:47  Densities at the left lung base are suggestive for atelectasis.  **END ADDENDUM** SIGNED BY: Markus Daft, M.D.  04/20/2013   *RADIOLOGY REPORT*  Clinical Data: Syncope.  CHEST - 2 VIEW  Comparison: 02/17/2013  Findings: Two views of the chest were obtained.  A few densities at the left lung base are suggestive for atelectasis.  Mitral annular calcifications.  Stable appearance of the heart.  No evidence for pulmonary edema.  Degenerative changes in the thoracic spine.  IMPRESSION: No acute chest abnormality.   Original Report Authenticated By: Markus Daft, M.D.   Ct Angio Chest W/cm &/or Wo Cm  04/20/2013   *RADIOLOGY REPORT*  Clinical Data: Sudden onset of dizziness.  The patient was unresponsive.  Recent pulmonary embolus.  The patient is recently discontinued Xarelto  CT ANGIOGRAPHY CHEST  Technique:  Multidetector CT imaging of the chest using the standard protocol  during bolus administration of intravenous contrast. Multiplanar reconstructed images including MIPs were obtained and reviewed to evaluate the vascular anatomy.  Contrast: 185m OMNIPAQUE IOHEXOL 350 MG/ML SOLN  Comparison: CTA chest 02/18/2013.  Findings: Pulmonary arterial opacification is excellent. Previously seen pulmonary emboli have resolved.  No focal filling defects are present on today's exam to suggest pulmonary emboli. Coronary artery calcifications are again noted.  Mild to calcifications of the thoracic aorta are noted.  A large hiatal hernia is evident.  The heart is mildly enlarged.  No significant pleural or pericardial effusion is present.  Limited imaging of the upper abdomen is otherwise unremarkable.  Mild dependent atelectasis is present at the lung bases bilaterally.  The bone windows demonstrate mild degenerative changes throughout the thoracic spine.  No focal lytic or blastic lesions are evident.  IMPRESSION:  1.  No evidence of pulmonary embolus.  The previously noted pulmonary emboli have resolved. 2.  No acute or focal abnormality to explain the patient's episode. 3.  Extensive atherosclerotic calcification including coronary artery disease. 4.  Large hiatal hernia.   Original Report Authenticated By: CSan Morelle M.D.     1. Syncope      Date: 04/20/2013  Rate: 65  Rhythm: normal sinus rhythm  QRS Axis: normal  Intervals: normal  ST/T Wave abnormalities: normal  Conduction Disutrbances:right bundle branch block  Narrative Interpretation:   Old EKG Reviewed: unchanged    MDM  Patient with syncope. History of same has been worked up. No clear cause found. EKG is reassuring and lab work is  stable. She's had previous PEs but has none now. Patient feels better will be discharged home. She's aware of the risks but I think the benefits of hospitalization or lower than the risk of admission.        Jasper Riling. Alvino Chapel, MD 04/20/13 [phone]

## 2013-04-20 NOTE — ED Notes (Signed)
Per EMS, pt hx R shoulder pain, has been taking Protonix per order.  Pt was sitting on her walker/wheelchair and became dizzy.  Pt lost consciousness and was brought to the floor by her neighbors.  Pt did not fall, but neighbors reported that pt was unresponsive for 3-4 minutes.  Pt hypotensive, no orthostatic changes with EMS.  Pt A&O at this time.

## 2013-04-20 NOTE — ED Notes (Signed)
[id number] Expected date:<BR> Expected time:<BR> Means of arrival:<BR> Comments:<BR> Ems, 79 yo, syncopal/ pass out episode. No fall

## 2013-05-11 DIAGNOSIS — I824Z9 Acute embolism and thrombosis of unspecified deep veins of unspecified distal lower extremity: Secondary | ICD-10-CM | POA: Diagnosis not present

## 2013-05-11 DIAGNOSIS — E119 Type 2 diabetes mellitus without complications: Secondary | ICD-10-CM | POA: Diagnosis not present

## 2013-05-11 DIAGNOSIS — I10 Essential (primary) hypertension: Secondary | ICD-10-CM | POA: Diagnosis not present

## 2013-05-11 DIAGNOSIS — I2699 Other pulmonary embolism without acute cor pulmonale: Secondary | ICD-10-CM | POA: Diagnosis not present

## 2013-05-11 DIAGNOSIS — G894 Chronic pain syndrome: Secondary | ICD-10-CM | POA: Diagnosis not present

## 2013-05-11 DIAGNOSIS — G609 Hereditary and idiopathic neuropathy, unspecified: Secondary | ICD-10-CM | POA: Diagnosis not present

## 2013-05-11 DIAGNOSIS — F329 Major depressive disorder, single episode, unspecified: Secondary | ICD-10-CM | POA: Diagnosis not present

## 2013-05-11 DIAGNOSIS — E538 Deficiency of other specified B group vitamins: Secondary | ICD-10-CM | POA: Diagnosis not present

## 2013-05-11 DIAGNOSIS — F3289 Other specified depressive episodes: Secondary | ICD-10-CM | POA: Diagnosis not present

## 2013-05-18 DIAGNOSIS — E870 Hyperosmolality and hypernatremia: Secondary | ICD-10-CM | POA: Diagnosis not present

## 2013-05-18 DIAGNOSIS — R112 Nausea with vomiting, unspecified: Secondary | ICD-10-CM | POA: Diagnosis not present

## 2013-05-18 DIAGNOSIS — E119 Type 2 diabetes mellitus without complications: Secondary | ICD-10-CM | POA: Diagnosis not present

## 2013-05-18 DIAGNOSIS — E538 Deficiency of other specified B group vitamins: Secondary | ICD-10-CM | POA: Diagnosis not present

## 2013-05-27 ENCOUNTER — Observation Stay (HOSPITAL_COMMUNITY): Payer: Medicare Other

## 2013-05-27 ENCOUNTER — Inpatient Hospital Stay (HOSPITAL_COMMUNITY)
Admission: AD | Admit: 2013-05-27 | Discharge: 2013-05-30 | DRG: 392 | Disposition: A | Discharge status: Home Health | Payer: Medicare Other | Source: Ambulatory Visit | Attending: Internal Medicine | Admitting: Internal Medicine

## 2013-05-27 ENCOUNTER — Encounter (HOSPITAL_COMMUNITY): Payer: Self-pay

## 2013-05-27 DIAGNOSIS — R5381 Other malaise: Secondary | ICD-10-CM

## 2013-05-27 DIAGNOSIS — R112 Nausea with vomiting, unspecified: Secondary | ICD-10-CM

## 2013-05-27 DIAGNOSIS — I2699 Other pulmonary embolism without acute cor pulmonale: Secondary | ICD-10-CM | POA: Diagnosis not present

## 2013-05-27 DIAGNOSIS — K2980 Duodenitis without bleeding: Secondary | ICD-10-CM | POA: Diagnosis present

## 2013-05-27 DIAGNOSIS — Z8719 Personal history of other diseases of the digestive system: Secondary | ICD-10-CM

## 2013-05-27 DIAGNOSIS — K219 Gastro-esophageal reflux disease without esophagitis: Secondary | ICD-10-CM | POA: Diagnosis not present

## 2013-05-27 DIAGNOSIS — R5383 Other fatigue: Secondary | ICD-10-CM

## 2013-05-27 DIAGNOSIS — I10 Essential (primary) hypertension: Secondary | ICD-10-CM | POA: Diagnosis not present

## 2013-05-27 DIAGNOSIS — D649 Anemia, unspecified: Secondary | ICD-10-CM | POA: Diagnosis present

## 2013-05-27 DIAGNOSIS — R0602 Shortness of breath: Secondary | ICD-10-CM

## 2013-05-27 DIAGNOSIS — F3289 Other specified depressive episodes: Secondary | ICD-10-CM | POA: Diagnosis not present

## 2013-05-27 DIAGNOSIS — R630 Anorexia: Secondary | ICD-10-CM | POA: Diagnosis present

## 2013-05-27 DIAGNOSIS — E869 Volume depletion, unspecified: Secondary | ICD-10-CM | POA: Diagnosis not present

## 2013-05-27 DIAGNOSIS — I82509 Chronic embolism and thrombosis of unspecified deep veins of unspecified lower extremity: Secondary | ICD-10-CM | POA: Diagnosis present

## 2013-05-27 DIAGNOSIS — I2782 Chronic pulmonary embolism: Secondary | ICD-10-CM | POA: Diagnosis present

## 2013-05-27 DIAGNOSIS — N390 Urinary tract infection, site not specified: Secondary | ICD-10-CM

## 2013-05-27 DIAGNOSIS — F329 Major depressive disorder, single episode, unspecified: Secondary | ICD-10-CM | POA: Diagnosis not present

## 2013-05-27 DIAGNOSIS — I82409 Acute embolism and thrombosis of unspecified deep veins of unspecified lower extremity: Secondary | ICD-10-CM | POA: Diagnosis not present

## 2013-05-27 DIAGNOSIS — R001 Bradycardia, unspecified: Secondary | ICD-10-CM | POA: Diagnosis present

## 2013-05-27 DIAGNOSIS — E860 Dehydration: Secondary | ICD-10-CM | POA: Diagnosis not present

## 2013-05-27 DIAGNOSIS — G4730 Sleep apnea, unspecified: Secondary | ICD-10-CM

## 2013-05-27 DIAGNOSIS — R1110 Vomiting, unspecified: Secondary | ICD-10-CM | POA: Diagnosis not present

## 2013-05-27 DIAGNOSIS — S2242XS Multiple fractures of ribs, left side, sequela: Secondary | ICD-10-CM

## 2013-05-27 DIAGNOSIS — Z7901 Long term (current) use of anticoagulants: Secondary | ICD-10-CM | POA: Diagnosis not present

## 2013-05-27 DIAGNOSIS — R197 Diarrhea, unspecified: Secondary | ICD-10-CM

## 2013-05-27 DIAGNOSIS — E785 Hyperlipidemia, unspecified: Secondary | ICD-10-CM

## 2013-05-27 DIAGNOSIS — I82403 Acute embolism and thrombosis of unspecified deep veins of lower extremity, bilateral: Secondary | ICD-10-CM

## 2013-05-27 DIAGNOSIS — G4700 Insomnia, unspecified: Secondary | ICD-10-CM

## 2013-05-27 DIAGNOSIS — E1169 Type 2 diabetes mellitus with other specified complication: Secondary | ICD-10-CM | POA: Diagnosis present

## 2013-05-27 DIAGNOSIS — A088 Other specified intestinal infections: Principal | ICD-10-CM | POA: Diagnosis present

## 2013-05-27 DIAGNOSIS — G2581 Restless legs syndrome: Secondary | ICD-10-CM

## 2013-05-27 DIAGNOSIS — I498 Other specified cardiac arrhythmias: Secondary | ICD-10-CM | POA: Diagnosis present

## 2013-05-27 DIAGNOSIS — I824Z9 Acute embolism and thrombosis of unspecified deep veins of unspecified distal lower extremity: Secondary | ICD-10-CM | POA: Diagnosis not present

## 2013-05-27 DIAGNOSIS — K2900 Acute gastritis without bleeding: Secondary | ICD-10-CM | POA: Diagnosis present

## 2013-05-27 DIAGNOSIS — K222 Esophageal obstruction: Secondary | ICD-10-CM

## 2013-05-27 DIAGNOSIS — W19XXXS Unspecified fall, sequela: Secondary | ICD-10-CM

## 2013-05-27 DIAGNOSIS — K862 Cyst of pancreas: Secondary | ICD-10-CM

## 2013-05-27 DIAGNOSIS — R531 Weakness: Secondary | ICD-10-CM | POA: Diagnosis present

## 2013-05-27 DIAGNOSIS — N3281 Overactive bladder: Secondary | ICD-10-CM

## 2013-05-27 DIAGNOSIS — K863 Pseudocyst of pancreas: Secondary | ICD-10-CM

## 2013-05-27 DIAGNOSIS — M75122 Complete rotator cuff tear or rupture of left shoulder, not specified as traumatic: Secondary | ICD-10-CM

## 2013-05-27 DIAGNOSIS — K5730 Diverticulosis of large intestine without perforation or abscess without bleeding: Secondary | ICD-10-CM

## 2013-05-27 DIAGNOSIS — E119 Type 2 diabetes mellitus without complications: Secondary | ICD-10-CM

## 2013-05-27 DIAGNOSIS — M353 Polymyalgia rheumatica: Secondary | ICD-10-CM | POA: Diagnosis present

## 2013-05-27 DIAGNOSIS — G609 Hereditary and idiopathic neuropathy, unspecified: Secondary | ICD-10-CM | POA: Diagnosis not present

## 2013-05-27 DIAGNOSIS — K449 Diaphragmatic hernia without obstruction or gangrene: Secondary | ICD-10-CM

## 2013-05-27 DIAGNOSIS — J984 Other disorders of lung: Secondary | ICD-10-CM | POA: Diagnosis not present

## 2013-05-27 LAB — COMPREHENSIVE METABOLIC PANEL
AST: 20 U/L (ref 0–37)
Albumin: 3.7 g/dL (ref 3.5–5.2)
Alkaline Phosphatase: 87 U/L (ref 39–117)
BUN: 21 mg/dL (ref 6–23)
CO2: 30 mEq/L (ref 19–32)
Chloride: 104 mEq/L (ref 96–112)
GFR calc non Af Amer: 48 mL/min — ABNORMAL LOW (ref >90)
Potassium: 4.1 mEq/L (ref 3.5–5.1)
Total Bilirubin: 0.5 mg/dL (ref 0.3–1.2)

## 2013-05-27 LAB — GLUCOSE, CAPILLARY: Glucose-Capillary: 69 mg/dL — ABNORMAL LOW (ref 70–99)

## 2013-05-27 LAB — CBC
MCH: 27.5 pg (ref 26.0–34.0)
MCV: 88.5 fL (ref 78.0–100.0)
Platelets: 220 10*3/uL (ref 150–400)
RDW: 14.5 % (ref 11.5–15.5)

## 2013-05-27 MED ORDER — ROPINIROLE HCL ER 6 MG PO TB24: 1.0000 | ORAL_TABLET | Freq: Every evening | ORAL | Status: DC

## 2013-05-27 MED ORDER — ERGOCALCIFEROL 1.25 MG (50000 UT) PO CAPS: 50000.0000 [IU] | ORAL_CAPSULE | ORAL | Status: DC

## 2013-05-27 MED ORDER — PANTOPRAZOLE SODIUM 40 MG IV SOLR
40.0000 mg | Freq: Two times a day (BID) | INTRAVENOUS | Status: DC
  Administered 2013-05-27 – 2013-05-29 (×4): 40 mg via INTRAVENOUS
  Filled 2013-05-27 (×5): qty 40

## 2013-05-27 MED ORDER — ONDANSETRON HCL 4 MG/2ML IJ SOLN: 4.0000 mg | Freq: Four times a day (QID) | INTRAMUSCULAR | Status: DC | PRN

## 2013-05-27 MED ORDER — VITAMIN D (ERGOCALCIFEROL) 1.25 MG (50000 UNIT) PO CAPS
50000.0000 [IU] | ORAL_CAPSULE | ORAL | Status: DC
  Filled 2013-05-27 (×2): qty 1

## 2013-05-27 MED ORDER — MIRTAZAPINE 15 MG PO TABS
15.0000 mg | ORAL_TABLET | Freq: Every day | ORAL | Status: DC
  Administered 2013-05-27 – 2013-05-29 (×3): 15 mg via ORAL
  Filled 2013-05-27 (×4): qty 1

## 2013-05-27 MED ORDER — SODIUM CHLORIDE 0.9 % IV SOLN
INTRAVENOUS | Status: DC
  Administered 2013-05-27 – 2013-05-28 (×2): via INTRAVENOUS

## 2013-05-27 MED ORDER — OXYCODONE HCL 5 MG PO TABS
5.0000 mg | ORAL_TABLET | Freq: Four times a day (QID) | ORAL | Status: DC | PRN
  Administered 2013-05-28 – 2013-05-29 (×2): 10 mg via ORAL
  Filled 2013-05-27 (×3): qty 2

## 2013-05-27 MED ORDER — ONDANSETRON HCL 4 MG PO TABS: 4.0000 mg | ORAL_TABLET | Freq: Four times a day (QID) | ORAL | Status: DC | PRN

## 2013-05-27 MED ORDER — RIVAROXABAN 20 MG PO TABS
20.0000 mg | ORAL_TABLET | Freq: Every day | ORAL | Status: DC
  Administered 2013-05-27 – 2013-05-29 (×3): 20 mg via ORAL
  Filled 2013-05-27 (×4): qty 1

## 2013-05-27 MED ORDER — INSULIN ASPART 100 UNIT/ML ~~LOC~~ SOLN: 0.0000 [IU] | Freq: Every day | SUBCUTANEOUS | Status: DC

## 2013-05-27 MED ORDER — PANTOPRAZOLE SODIUM 40 MG IV SOLR: 40.0000 mg | Freq: Two times a day (BID) | INTRAVENOUS | Status: DC

## 2013-05-27 MED ORDER — LOSARTAN POTASSIUM 25 MG PO TABS
25.0000 mg | ORAL_TABLET | Freq: Every day | ORAL | Status: DC
  Administered 2013-05-28 – 2013-05-30 (×3): 25 mg via ORAL
  Filled 2013-05-27 (×3): qty 1

## 2013-05-27 MED ORDER — ROPINIROLE HCL ER 2 MG PO TB24
6.0000 mg | ORAL_TABLET | Freq: Every day | ORAL | Status: DC
  Administered 2013-05-27 – 2013-05-29 (×3): 6 mg via ORAL
  Filled 2013-05-27 (×4): qty 3

## 2013-05-27 MED ORDER — ACETAMINOPHEN 325 MG PO TABS: 650.0000 mg | ORAL_TABLET | Freq: Four times a day (QID) | ORAL | Status: DC | PRN

## 2013-05-27 MED ORDER — INSULIN ASPART 100 UNIT/ML ~~LOC~~ SOLN: 0.0000 [IU] | Freq: Three times a day (TID) | SUBCUTANEOUS | Status: DC

## 2013-05-27 MED ORDER — L-METHYLFOLATE-B6-B12 3-35-2 MG PO TABS
1.0000 | ORAL_TABLET | Freq: Two times a day (BID) | ORAL | Status: DC
  Administered 2013-05-27 – 2013-05-30 (×6): 1 via ORAL
  Filled 2013-05-27 (×8): qty 1

## 2013-05-27 MED ORDER — ACETAMINOPHEN 650 MG RE SUPP: 650.0000 mg | Freq: Four times a day (QID) | RECTAL | Status: DC | PRN

## 2013-05-27 MED ORDER — TIZANIDINE HCL 2 MG PO TABS
2.0000 mg | ORAL_TABLET | Freq: Three times a day (TID) | ORAL | Status: DC | PRN
  Administered 2013-05-28 – 2013-05-30 (×2): 4 mg via ORAL
  Filled 2013-05-27 (×3): qty 2

## 2013-05-27 MED ORDER — BOOST / RESOURCE BREEZE PO LIQD
1.0000 | Freq: Three times a day (TID) | ORAL | Status: DC
  Administered 2013-05-27 – 2013-05-28 (×3): 1 via ORAL

## 2013-05-27 MED ORDER — SODIUM CHLORIDE 0.9 % IJ SOLN
3.0000 mL | Freq: Two times a day (BID) | INTRAMUSCULAR | Status: DC
  Administered 2013-05-27 – 2013-05-30 (×5): 3 mL via INTRAVENOUS

## 2013-05-27 NOTE — Progress Notes (Signed)
Hypoglycemic Event  CBG: 66  Treatment: 1/2 cup coca cola  Symptoms: none  Follow-up CBG: Time:1725 CBG Result:78  Possible Reasons for Event: n/v has not eaten today  Comments/MD notified MD previously notified    Lauralee Evener  Remember to initiate Hypoglycemia Order Set & complete

## 2013-05-27 NOTE — Progress Notes (Signed)
Report given to [employer] on [place], transferred to room 1429

## 2013-05-27 NOTE — H&P (Signed)
[facility] History and Physical  [name] [id number] DOB: [birth year] DOA: 05/27/2013  Referring physician: Benito Mccreedy, MD PCP: Benito Mccreedy, MD  Specialists:    Chief Complaint: Nausea vomiting  HPI: [name] is a 79 y.o. female history of diabetes mellitus, GERD/gastritis/duodenitis, stricture and stenosis of esopagus, hypertension and multiple medical problems as listed below who presents with above complaints. She states that last week she had some nausea vomiting and this morning she began vomiting again-x3 so far today, nonbloody. She denies abdominal pain and no diarrhea. She also denies fevers, cough, chest pain, dysuria melena and no hematochezia. She states in the past 2 weeks she has had generalized weakness which has been worsening. She went to her PCPs office today, was directly admitted for the symptoms as she appeared clinically dehydrated per PCP. No labs done in the office today.   Review of Systems: The patient denies, fever, weight loss,, vision loss, decreased hearing, hoarseness, chest pain, syncope, dyspnea on exertion, peripheral edema, balance deficits, hemoptysis, abdominal pain, melena, hematochezia, severe indigestion/heartburn, hematuria, incontinence, transient blindness, difficulty walking, depression, unusual weight change, abnormal bleeding.    Past Medical History  Diagnosis Date  . Cyst and pseudocyst of pancreas   . Unspecified essential hypertension   . Other and unspecified hyperlipidemia   . Polymyalgia rheumatica   . Personal history of unspecified digestive disease   . Restless legs syndrome (RLS)   . Insomnia, unspecified   . Other malaise and fatigue   . Diverticulosis of colon (without mention of hemorrhage)   . Acute gastritis without mention of hemorrhage   . Duodenitis without mention of hemorrhage   . Diaphragmatic hernia without mention of obstruction or gangrene   . Esophageal reflux   . Stricture and  stenosis of esophagus   . OAB (overactive bladder)   . Left leg DVT jan 2013  . Unspecified sleep apnea     hx of sleep apnea, none since weight loss  . Diabetes mellitus    Past Surgical History  Procedure Laterality Date  . Oophorectomy  1944    not sure which ovary  . Rotator cuff repair  right     4-5 yrs ago  . Eye surgery      cataracts removed-bilateral eyes  . Back surgery  2009    lower back  . Colon surgery  June 17, 2012    surgery for bleed  . Shoulder open rotator cuff repair  08/04/2012    Procedure: ROTATOR CUFF REPAIR SHOULDER OPEN;  Surgeon: Tobi Bastos, MD;  Location: [facility];  Service: Orthopedics;  Laterality: Left;  with Anchors and Graft   Social History:  reports that she has never smoked. She has never used smokeless tobacco. She reports that she does not drink alcohol or use illicit drugs. where does patient live--home Can patient participate in ADLs-yes  Allergies  Allergen Reactions  . Sulfa Antibiotics Nausea And Vomiting  . Penicillins Hives and Rash    Family History  Problem Relation Age of Onset  . Melanoma Daughter     died age 41  . Melanoma Son     Prior to Admission medications   Medication Sig Start Date End Date Taking? Authorizing Provider  cyanocobalamin (,VITAMIN B-12,) 1000 MCG/ML injection Inject 1,000 mcg into the muscle every 30 (thirty) days.   Yes Historical Provider, MD  ergocalciferol (VITAMIN D2) 50000 UNITS capsule Take 50,000 Units by mouth 2 (two) times a week. Wednesday and Sunday   Yes  Historical Provider, MD  l-methylfolate-B6-B12 (METANX) 3-35-2 MG TABS Take 1 tablet by mouth 2 (two) times daily.   Yes Historical Provider, MD  losartan (COZAAR) 25 MG tablet Take 25 mg by mouth daily.   Yes Historical Provider, MD  metFORMIN (GLUCOPHAGE) 500 MG tablet Take 500 mg by mouth 2 (two) times daily with a meal.    Yes Historical Provider, MD  mirtazapine (REMERON) 15 MG tablet Take 15 mg by mouth at bedtime.   Yes  Historical Provider, MD  oxyCODONE (OXY IR/ROXICODONE) 5 MG immediate release tablet Take 5-10 mg by mouth every 6 (six) hours as needed for pain.   Yes Historical Provider, MD  pantoprazole (PROTONIX) 40 MG tablet Take 1 tablet (40 mg total) by mouth daily. 02/22/13  Yes Eugenie Filler, MD  Rivaroxaban (XARELTO) 20 MG TABS Take 1 tablet (20 mg total) by mouth daily with supper. 03/14/13  Yes Eugenie Filler, MD  Ropinirole HCl (REQUIP XL) 6 MG TB24 Take 1 tablet by mouth every evening.   Yes Historical Provider, MD  tiZANidine (ZANAFLEX) 2 MG tablet Take 2-4 mg by mouth every 8 (eight) hours as needed (for muscle pain).   Yes Historical Provider, MD   Physical Exam: Filed Vitals:   05/27/13 1350 05/27/13 1500  BP: 126/71   Pulse: 63   Temp: 98 F (36.7 C)   Resp: 20   Height:  5\' 2"  (1.575 m)  Weight:  52.164 kg (115 lb)  SpO2: 100%     Constitutional: Vital signs reviewed.  Patient is a well-developed and thin appearing elderly female in no acute distress and cooperative with exam. Alert and oriented x3.  Head: Normocephalic and atraumatic Nose: No erythema or drainage noted.  Turbinates normal Mouth: no erythema or exudates, slightly dry MM Eyes: PERRL, EOMI, conjunctivae normal, No scleral icterus.  Neck: Supple, Trachea midline normal ROM, No JVD, mass, thyromegaly, or carotid bruit present.  Cardiovascular: RRR, S1 normal, S2 normal, pulses symmetric and intact bilaterally Pulmonary/Chest: normal respiratory effort, CTAB, no wheezes, rales, or rhonchi Abdominal: Soft. Non-tender, non-distended, bowel sounds are normal, no masses, organomegaly, or guarding present.  GU: no CVA tenderness  extremities: No cyanosis, trace edema bilaterally Neurological: A&O x3, Strength is normal and symmetric bilaterally, cranial nerve II-XII are grossly intact, no focal motor deficit, sensory intact to light touch bilaterally.  Skin: Warm, dry and intact. No rash.  Psychiatric: Normal mood and  affect.    Labs on Admission:  Basic Metabolic Panel: No results found for this basename: NA, K, CL, CO2, GLUCOSE, BUN, CREATININE, CALCIUM, MG, PHOS,  in the last 168 hours Liver Function Tests: No results found for this basename: AST, ALT, ALKPHOS, BILITOT, PROT, ALBUMIN,  in the last 168 hours No results found for this basename: LIPASE, AMYLASE,  in the last 168 hours No results found for this basename: AMMONIA,  in the last 168 hours CBC: No results found for this basename: WBC, NEUTROABS, HGB, HCT, MCV, PLT,  in the last 168 hours Cardiac Enzymes: No results found for this basename: CKTOTAL, CKMB, CKMBINDEX, TROPONINI,  in the last 168 hours  BNP (last 3 results) No results found for this basename: PROBNP,  in the last 8760 hours CBG: No results found for this basename: GLUCAP,  in the last 168 hours  Radiological Exams on Admission: No results found.  Assessment/Plan Active Problems: Nausea/vomiting -As discussed above in patient with history of GERD/gastritis/duodenitis as well as diabetes and prior prior, but has no  abd pain at this time. -Will obtain urinalysis, lipase, and cardiac enzymes -Will place on PPI, symptomatic treatment with antiemetics -Follow above studies and if not improving consider GI consult-she has prior history of esophageal stricture/stenosis is noted above for possible endo. -Place on clear liquids follow and advance as tolerated  Volume deplete -Obtain chemistries, hydrate follow and   HYPERTENSION -Continue outpatient meds   GERD/history ofGASTRITIS/DUODENITIS -Place on PPI as above     Weakness generalized -Obtain TSH, PTOT follow. -Also UA /chest x-ray as above to evaluate for infection   DVT, bilateral lower limbs/history of PE -continue xarelto History of restless leg -Continue outpatient CROHN'S DISEASE, HX OF -On no chronic meds, follow the     Code Status: full  Family Communication:none at bedside Disposition Plan: admit to  tele fo obsv  Time spent: >67mins  Sheila Oats [facility] Pager [phone]  If 7PM-7AM, please contact night-coverage [email or web address] Password [facility] 05/27/2013, 4:28 PM

## 2013-05-27 NOTE — Progress Notes (Signed)
INITIAL NUTRITION ASSESSMENT  DOCUMENTATION CODES Per approved criteria  -Not Applicable   INTERVENTION: 1. Resource Breeze po TID, each supplement provides 250 kcal and 9 grams of protein.  NUTRITION DIAGNOSIS: Inadequate oral intake related to chronic illness as evidenced by reported weight loss.   Goal: Pt to meet >/= 90% of their estimated nutrition needs.  Monitor:  Weight, po intake  Reason for Assessment: MST  79 y.o. female  Admitting Dx: <principal problem not specified>  ASSESSMENT: Pt recently admitted for nausea and dehydration per RN. Pt reports that she has had no appetite and has been eating very little. She reports a weight loss of about 10 lbs recently, unknown exact time frame. She reported that she weighed at the doctor's office this am and her weight was 115 lbs. She also reported that ensure causes her to have diarrhea. She would like to try [employer] as a supplement.   Height: Ht Readings from Last 1 Encounters:  03/31/13 5\' 1"  (1.549 m)    Weight: Wt Readings from Last 1 Encounters:  03/31/13 127 lb (57.607 kg)    Ideal Body Weight: 105 lbs  % Ideal Body Weight: 109%, based on reported weight of 115 lbs  Wt Readings from Last 10 Encounters:  03/31/13 127 lb (57.607 kg)  02/22/13 134 lb 0.6 oz (60.8 kg)  12/05/12 142 lb 10.2 oz (64.7 kg)  08/04/12 132 lb (59.875 kg)  08/04/12 132 lb (59.875 kg)  07/30/12 132 lb 11.2 oz (60.192 kg)  01/07/12 130 lb 11.7 oz (59.3 kg)  04/03/11 133 lb (60.328 kg)  03/31/11 133 lb (60.328 kg)  09/06/08 147 lb 8 oz (66.906 kg)    Usual Body Weight: 130 lbs  % Usual Body Weight: 88%  BMI:  There is no weight on file to calculate BMI.  Estimated Nutritional Needs: Kcal: 1300-1550 Protein: 70-80 g Fluid: >1.5 L  Skin: WNL  Diet Order: Clear Liquid  EDUCATION NEEDS: -No education needs identified at this time  No intake or output data in the 24 hours ending 05/27/13 1544  Last BM: before  admission   Labs:  No results found for this basename: NA, K, CL, CO2, BUN, CREATININE, CALCIUM, MG, PHOS, GLUCOSE,  in the last 168 hours  CBG (last 3)  No results found for this basename: GLUCAP,  in the last 72 hours  Scheduled Meds:   Continuous Infusions: . sodium chloride      Past Medical History  Diagnosis Date  . Cyst and pseudocyst of pancreas   . Unspecified essential hypertension   . Other and unspecified hyperlipidemia   . Polymyalgia rheumatica   . Personal history of unspecified digestive disease   . Restless legs syndrome (RLS)   . Insomnia, unspecified   . Other malaise and fatigue   . Diverticulosis of colon (without mention of hemorrhage)   . Acute gastritis without mention of hemorrhage   . Duodenitis without mention of hemorrhage   . Diaphragmatic hernia without mention of obstruction or gangrene   . Esophageal reflux   . Stricture and stenosis of esophagus   . OAB (overactive bladder)   . Left leg DVT jan 2013  . Unspecified sleep apnea     hx of sleep apnea, none since weight loss  . Diabetes mellitus     Past Surgical History  Procedure Laterality Date  . Oophorectomy  1944    not sure which ovary  . Rotator cuff repair  right     4-5  yrs ago  . Eye surgery      cataracts removed-bilateral eyes  . Back surgery  2009    lower back  . Colon surgery  June 17, 2012    surgery for bleed  . Shoulder open rotator cuff repair  08/04/2012    Procedure: ROTATOR CUFF REPAIR SHOULDER OPEN;  Surgeon: Tobi Bastos, MD;  Location: [facility];  Service: Orthopedics;  Laterality: Left;  with Anchors and Graft    Terrace Arabia RD, LDN

## 2013-05-27 NOTE — Progress Notes (Addendum)
Hypoglycemic Event  CBG: 69  Treatment: 1/2 coca cola  Symptoms: none  Follow-up CBG: Time:1705 CBG Result:66  Possible Reasons for Event: patient in with n/v has not eaten today  Comments/MD notified:Dr. Inis Sizer notified on floor    Lauralee Evener  Remember to initiate Hypoglycemia Order Set & complete

## 2013-05-27 NOTE — Progress Notes (Signed)
Left a message for the flow manager that patient has arrived per Dr. Inis Sizer.

## 2013-05-27 NOTE — Progress Notes (Signed)
Paged Dr. Inis Sizer that patient is on floor

## 2013-05-28 DIAGNOSIS — R112 Nausea with vomiting, unspecified: Secondary | ICD-10-CM | POA: Diagnosis not present

## 2013-05-28 DIAGNOSIS — E869 Volume depletion, unspecified: Secondary | ICD-10-CM | POA: Diagnosis not present

## 2013-05-28 DIAGNOSIS — I498 Other specified cardiac arrhythmias: Secondary | ICD-10-CM | POA: Diagnosis not present

## 2013-05-28 DIAGNOSIS — R001 Bradycardia, unspecified: Secondary | ICD-10-CM | POA: Diagnosis present

## 2013-05-28 DIAGNOSIS — R5383 Other fatigue: Secondary | ICD-10-CM | POA: Diagnosis not present

## 2013-05-28 LAB — GLUCOSE, CAPILLARY
Glucose-Capillary: 94 mg/dL (ref 70–99)
Glucose-Capillary: 73 mg/dL (ref 70–99)
Glucose-Capillary: 87 mg/dL (ref 70–99)
Glucose-Capillary: 115 mg/dL — ABNORMAL HIGH (ref 70–99)

## 2013-05-28 LAB — CBC
HCT: 29.1 % — ABNORMAL LOW (ref 36.0–46.0)
Hemoglobin: 9.1 g/dL — ABNORMAL LOW (ref 12.0–15.0)
MCV: 88.4 fL (ref 78.0–100.0)
Platelets: 188 10*3/uL (ref 150–400)
RBC: 3.29 MIL/uL — ABNORMAL LOW (ref 3.87–5.11)
WBC: 4.7 10*3/uL (ref 4.0–10.5)

## 2013-05-28 LAB — URINALYSIS, ROUTINE W REFLEX MICROSCOPIC
Bilirubin Urine: NEGATIVE
Hgb urine dipstick: NEGATIVE
Ketones, ur: NEGATIVE mg/dL
Protein, ur: NEGATIVE mg/dL
Urobilinogen, UA: 0.2 mg/dL (ref 0.0–1.0)

## 2013-05-28 LAB — BASIC METABOLIC PANEL
CO2: 28 mEq/L (ref 19–32)
Calcium: 8.6 mg/dL (ref 8.4–10.5)
Chloride: 107 mEq/L (ref 96–112)
Glucose, Bld: 109 mg/dL — ABNORMAL HIGH (ref 70–99)
Potassium: 3.5 mEq/L (ref 3.5–5.1)
Sodium: 142 mEq/L (ref 135–145)

## 2013-05-28 LAB — TSH: TSH: 1.332 u[IU]/mL (ref 0.350–4.500)

## 2013-05-28 MED ORDER — SODIUM CHLORIDE 0.9 % IV SOLN
INTRAVENOUS | Status: AC
  Administered 2013-05-28: 16:00:00 via INTRAVENOUS

## 2013-05-28 MED ORDER — INSULIN ASPART 100 UNIT/ML ~~LOC~~ SOLN
0.0000 [IU] | Freq: Three times a day (TID) | SUBCUTANEOUS | Status: DC
  Administered 2013-05-29: 1 [IU] via SUBCUTANEOUS

## 2013-05-28 NOTE — Progress Notes (Signed)
Pt with heart rate in the 50's at rest while in the bed.  When patient walking in halls heart rate in the 90's.  Walked in hall with walker, tolerated well. Gait steady with walker.  Avraham Benish Roselie Awkward RN

## 2013-05-28 NOTE — Evaluation (Signed)
Physical Therapy Evaluation Patient Details Name: [name] MRN: [id number] DOB: 13-Jul-1919 Today's Date: 05/28/2013 Time: [id number] PT Time Calculation (min): 19 min  PT Assessment / Plan / Recommendation Clinical Impression  Pt presents with HTN and N/V with history of DM, GERD, HTN and L rotator cuff repair last year.  Tolerated OOB and ambulation in hallway very well with RW and quad cane, however feel that RW is safer at this time.  Pt will benefit from skilled PT in acute venue to address deficits.  PT recommends HHPT for follow up and continue [facility] aide at home.      PT Assessment  Patient needs continued PT services    Follow Up Recommendations  Home health PT;Supervision - Intermittent    Does the patient have the potential to tolerate intense rehabilitation      Barriers to Discharge None      Equipment Recommendations  None recommended by PT    Recommendations for Other Services     Frequency Min 3X/week    Precautions / Restrictions Precautions Precautions: Fall Precaution Comments: very mild fall risk Restrictions Weight Bearing Restrictions: No   Pertinent Vitals/Pain Some pain in R knee      Mobility  Bed Mobility Bed Mobility: Supine to Sit Supine to Sit: 5: Supervision;HOB elevated Details for Bed Mobility Assistance: Supervision for safety.  Transfers Transfers: Sit to Stand;Stand to Sit Sit to Stand: 4: Min guard;With upper extremity assist;From bed Stand to Sit: 4: Min guard;With upper extremity assist;With armrests;To chair/3-in-1 Details for Transfer Assistance: Min/guard for safety with cues for hand placement and safety.   Ambulation/Gait Ambulation/Gait Assistance: 4: Min guard Ambulation Distance (Feet): 200 Feet Assistive device: Rolling walker;Small based quad cane Ambulation/Gait Assistance Details: Pt initially used RW to ambulate and does very well with min cues for maintaining position inside of RW.  Transitioned to small based  quad cane of pts and note that pt doesn't always have four legs on ground and doesn't use it at all to get back to chair.  She also states increased knee pain coming back to room.     Exercises     PT Diagnosis: Difficulty walking;Generalized weakness;Acute pain  PT Problem List: Decreased strength;Decreased activity tolerance;Decreased balance;Decreased mobility;Pain PT Treatment Interventions: DME instruction;Gait training;Stair training;Functional mobility training;Therapeutic activities;Therapeutic exercise;Balance training;Patient/family education   PT Goals Acute Rehab PT Goals PT Goal Formulation: With patient Time For Goal Achievement: 06/04/13 Potential to Achieve Goals: Good Pt will go Sit to Stand: with modified independence PT Goal: Sit to Stand - Progress: Goal set today Pt will go Stand to Sit: with modified independence PT Goal: Stand to Sit - Progress: Goal set today Pt will Ambulate: >150 feet;with supervision;with least restrictive assistive device PT Goal: Ambulate - Progress: Goal set today Pt will Go Up / Down Stairs: 3-5 stairs;with supervision;with least restrictive assistive device;with rail(s) PT Goal: Up/Down Stairs - Progress: Goal set today Pt will Perform Home Exercise Program: with supervision, verbal cues required/provided PT Goal: Perform Home Exercise Program - Progress: Goal set today  Visit Information  Last PT Received On: 05/28/13 Assistance Needed: +1    Subjective Data  Subjective: I've been up moving already Patient Stated Goal: to return home.    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    Cognition  Cognition Arousal/Alertness: Awake/alert Behavior During Therapy: WFL for tasks assessed/performed Overall Cognitive Status: Within Functional Limits for tasks assessed    Extremity/Trunk Assessment Right Upper Extremity Assessment RUE ROM/Strength/Tone: Within functional levels Left Upper Extremity Assessment LUE ROM/Strength/Tone: Within functional levels Right Lower Extremity Assessment RLE ROM/Strength/Tone: WFL for tasks assessed RLE Sensation: WFL - Light Touch Left Lower Extremity Assessment LLE ROM/Strength/Tone: WFL for tasks assessed LLE Sensation: WFL - Light Touch Trunk Assessment Trunk Assessment: Kyphotic   Balance    End of Session PT - End of Session Activity Tolerance: Patient tolerated treatment well;Patient limited by pain Patient left: in chair;with call bell/phone within reach Nurse Communication: Mobility status  GP Functional Assessment Tool Used: Clinical judgement Functional Limitation: Mobility: Walking and moving around Mobility: Walking and Moving Around Current Status [id number]): At least 1 percent but less than 20 percent impaired, limited or restricted Mobility: Walking and Moving Around Goal Status [phone]): 0 percent impaired, limited or restricted   Denice Bors 05/28/2013, 5:00 PM

## 2013-05-28 NOTE — Progress Notes (Signed)
Patient had a decreased HR which went to low 40s.  Patient was asleep at the time. CMT was notified.

## 2013-05-28 NOTE — Progress Notes (Addendum)
[facility] PROGRESS NOTE  [name] [id number] DOB: [birth year] DOA: 05/27/2013 PCP: Benito Mccreedy, MD  Brief narrative 79 y.o. female with PMH of diabetes mellitus, GERD/gastritis/duodenitis, stricture and stenosis of esopagus, hypertension and multiple medical problems who was admitted on 05/27/13. She gives 3 week history of generalized weakness and poor appetite but denies abdominal pain. She had 3 episodes of vomiting on the day of admission. She denies dysphagia. She also denies fevers, cough, chest pain, dysuria melena and no hematochezia. She went to her PCPs office on 6/20, was directly admitted for the symptoms as she appeared clinically dehydrated per PCP.   Assessment/Plan: 1. Nausea and vomiting: DD-acute viral GE/gastritis/duodenitis/GERD. No further episodes of nausea and vomiting since admission. Advance diet as tolerated and monitor. Continue PPIs. If she has recurrent symptoms, may consider GI consultation. She has prior history of esophageal stricture/stenosis which was dilated. 2. Sinus bradycardia: Heart rates in the 40s at rest and increased to 90s with activity. Patient not on rate limiting medications. Check TSH. Continue monitoring on telemetry. 3. Generalized weakness: Possibly secondary to problem #1 versus? Secondary to bradycardia. PT evaluation. 4. Dehydration: Secondary to nausea and vomiting. Brief IV fluids and monitor. 5. Type II DM with hypoglycemia: Patient had CBGs in the 60s on 6/20 evening. Improved. Monitor closely. Metformin held. Change SSI to sensitive without bedtime scale 6. Hypertension: Controlled.  7. History of bilateral lower extremity DVT and PE: Continue anticoagulation with Xarelto 8. History of Crohn's disease: Management per problem #1 and may consider GI consultation if has recurrence of symptoms. 9. Anemia: Likely chronic and stable.  Code Status: Full Family Communication: None Disposition Plan: Home when medically  stable.   Consultants:  None  Procedures:  None  Antibiotics:  None   HPI/Subjective: No further nausea or vomiting. Generalized weakness of 3 weeks duration.  Objective: Filed Vitals:   05/27/13 1819 05/27/13 2132 05/28/13 0541 05/28/13 0800  BP: 123/75 148/72 103/51   Pulse: 68 56 51   Temp: 98.7 F (37.1 C) 97.5 F (36.4 C) 97.4 F (36.3 C)   TempSrc: Oral Oral Axillary   Resp: 15 18 18    Height: 5' 2"  (1.575 m)     Weight: 52.1 kg (114 lb 13.8 oz)     SpO2: 100% 98% 98% 98%    Intake/Output Summary (Last 24 hours) at 05/28/13 1458 Last data filed at 05/28/13 1300  Gross per 24 hour  Intake   2180 ml  Output   1000 ml  Net   1180 ml   Filed Weights   05/27/13 1500 05/27/13 1819  Weight: 52.164 kg (115 lb) 52.1 kg (114 lb 13.8 oz)    Exam:   General exam: Comfortable.  Respiratory system: Clear. No increased work of breathing.  Cardiovascular system: S1 & S2 heard, regular bradycardic. No JVD, murmurs, gallops, clicks or pedal edema. Telemetry: Sinus bradycardia in the 40s to 50s.  Gastrointestinal system: Abdomen is nondistended, soft and nontender. Normal bowel sounds heard.  Central nervous system: Alert and oriented. No focal neurological deficits.  Extremities: Symmetric 5 x 5 power.   Data Reviewed: Basic Metabolic Panel:  Recent Labs Lab 05/27/13 1719 05/28/13 0425  NA 141 142  K 4.1 3.5  CL 104 107  CO2 30 28  GLUCOSE 101* 109*  BUN 21 17  CREATININE 1.00 0.96  CALCIUM 9.8 8.6   Liver Function Tests:  Recent Labs Lab 05/27/13 1719  AST 20  ALT 11  ALKPHOS 87  BILITOT 0.5  PROT 6.6  ALBUMIN 3.7    Recent Labs Lab 05/27/13 1719  LIPASE 11   No results found for this basename: AMMONIA,  in the last 168 hours CBC:  Recent Labs Lab 05/27/13 1719 05/28/13 0425  WBC 5.9 4.7  HGB 11.2* 9.1*  HCT 36.0 29.1*  MCV 88.5 88.4  PLT 220 188   Cardiac Enzymes:  Recent Labs Lab 05/27/13 1719 05/27/13 2244  05/28/13 0425  TROPONINI <0.30 <0.30 <0.30   BNP (last 3 results) No results found for this basename: PROBNP,  in the last 8760 hours CBG:  Recent Labs Lab 05/27/13 1647 05/27/13 1709 05/27/13 1726 05/28/13 0739 05/28/13 1148  GLUCAP 69* 66* 78 94 73    No results found for this or any previous visit (from the past 240 hour(s)).   Studies: Portable Chest 1 View  05/27/2013   *RADIOLOGY REPORT*  Clinical Data: Evaluate infiltrate  PORTABLE CHEST - 1 VIEW  Comparison: Chest radiograph 04/20/2013  Findings: Normal cardiac silhouette with ectatic aorta.  There is a hiatal hernia.  The lungs are hyperinflated.  No effusion, infiltrate, or pneumothorax.  Some scarring at the left lung base.  IMPRESSION: 1. No focal infiltrate. 2.  Scarring at the left lung base   Original Report Authenticated By: Suzy Bouchard, M.D.     Additional labs:   Scheduled Meds: . feeding supplement  1 Container Oral TID BM  . insulin aspart  0-15 Units Subcutaneous TID WC  . insulin aspart  0-5 Units Subcutaneous QHS  . l-methylfolate-B6-B12  1 tablet Oral BID  . losartan  25 mg Oral Daily  . mirtazapine  15 mg Oral QHS  . pantoprazole (PROTONIX) IV  40 mg Intravenous BID  . Rivaroxaban  20 mg Oral Q supper  . rOPINIRole  6 mg Oral QHS  . sodium chloride  3 mL Intravenous Q12H  . [START ON 05/30/2013] Vitamin D (Ergocalciferol)  50,000 Units Oral 2 times weekly   Continuous Infusions: . sodium chloride 75 mL/hr at 05/28/13 0503    Active Problems:   HYPERTENSION   GERD   GASTRITIS, ACUTE   DUODENITIS   Polymyalgia rheumatica   CROHN'S DISEASE, HX OF   Weakness generalized   DVT, bilateral lower limbs   Nausea with vomiting   Volume depletion    Time spent: 35 minutes    [facility] Pager [phone].   If 8PM-8AM, please contact night-coverage at [email or web address], password [facility] 05/28/2013, 2:58 PM  LOS: 1 day

## 2013-05-28 NOTE — Progress Notes (Signed)
Agree with previous RN's assessment of patient.  Patient is stable, resting in bed.  Will continue to monitor for duration of shift.

## 2013-05-28 NOTE — Evaluation (Signed)
Occupational Therapy Evaluation Patient Details Name: [name] MRN: [id number] DOB: 08/22/1919 Today's Date: 05/28/2013 Time: 1502- 1518    OT Assessment / Plan / Recommendation Clinical Impression  Pt presents to OT s/p admission to the hospital with HTN. Pt presents at close to baseline, but will benefit from skilled OT to increase I with ADL activity, as well as HHOT upon DC from hospital.    OT Assessment  Patient needs continued OT Services    Follow Up Recommendations  Home health OT       Equipment Recommendations  None recommended by OT       Frequency  Min 3X/week    Precautions / Restrictions Precautions Precautions: Fall Precaution Comments: very mild fall risk Restrictions Weight Bearing Restrictions: No       ADL  Grooming: Performed;Wash/dry face;Other (comment);Supervision/safety Lower Body Dressing: Performed;Min guard Where Assessed - Lower Body Dressing: Unsupported sit to stand Toilet Transfer: Performed;Supervision/safety Toilet Transfer Method: Sit to Loss adjuster, chartered: Comfort height toilet Toileting - Clothing Manipulation and Hygiene: Performed;Supervision/safety Where Assessed - Best boy and Hygiene: Standing Tub/Shower Transfer: +2 Total assistance Transfers/Ambulation Related to ADLs: Pt has A at home. Feel pt could benefit from skilled HHOT to increase I with ADL actiivty, as well as address fall prevention and safety in the home    OT Diagnosis: Generalized weakness  OT Problem List: Decreased strength;Decreased activity tolerance OT Treatment Interventions: Self-care/ADL training;Patient/family education;Therapeutic activities   OT Goals Acute Rehab OT Goals OT Goal Formulation: With patient Time For Goal Achievement: 06/11/13 Potential to Achieve Goals: Good ADL Goals Pt Will Perform Grooming: with modified independence;Standing at sink ADL Goal: Grooming - Progress: Goal set today Pt Will  Perform Upper Body Dressing: with modified independence;Sit to stand from chair ADL Goal: Upper Body Dressing - Progress: Goal set today Pt Will Perform Lower Body Dressing: with modified independence;Sit to stand from chair ADL Goal: Lower Body Dressing - Progress: Goal set today Pt Will Transfer to Toilet: with modified independence;Comfort height toilet ADL Goal: Toilet Transfer - Progress: Goal set today Pt Will Perform Toileting - Clothing Manipulation: with modified independence;Standing ADL Goal: Toileting - Clothing Manipulation - Progress: Goal set today  Visit Information  Last OT Received On: 05/28/13 Assistance Needed: +1    Subjective Data  Subjective: I came here from doctors office   Prior [place] Lives With: Alone Available Help at Discharge: Other (Comment) (hired A a few times a week) Type of Home: Other (Comment) (condo) Home Access: Stairs to enter [employer] of Steps: 5 Entrance Stairs-Rails: Can reach both Home Layout: One level Bathroom Shower/Tub: Chiropodist: Handicapped height [place]: Grab bars around toilet;Grab bars in shower;Walker - rolling;Quad cane 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         Vision/Perception Vision - History Baseline Vision: Wears glasses only for reading Patient Visual Report: No change from baseline Vision - Assessment Eye Alignment: Within Functional Limits   Cognition  Cognition Arousal/Alertness: Awake/alert Behavior During Therapy: WFL for tasks assessed/performed Overall Cognitive Status: Within Functional Limits for tasks assessed    Extremity/Trunk Assessment Right Upper Extremity Assessment RUE ROM/Strength/Tone: Within functional levels Left Upper Extremity Assessment LUE ROM/Strength/Tone: Within functional levels Right Lower Extremity  Assessment RLE ROM/Strength/Tone: WFL for tasks assessed RLE Sensation: WFL - Light Touch Left Lower Extremity Assessment LLE ROM/Strength/Tone: WFL for tasks assessed LLE Sensation: WFL -  Light Touch Trunk Assessment Trunk Assessment: Kyphotic     Mobility Bed Mobility Bed Mobility: Supine to Sit Supine to Sit: 5: Supervision;HOB elevated Details for Bed Mobility Assistance: Supervision for safety.  Transfers Transfers: Sit to Stand;Stand to Sit Sit to Stand: 4: Min guard;With upper extremity assist;From bed Stand to Sit: 4: Min guard;With upper extremity assist;With armrests;To chair/3-in-1 Details for Transfer Assistance: Min/guard for safety with cues for hand placement and safety.             End of Session OT - End of Session Activity Tolerance: Patient tolerated treatment well Patient left: in chair;with call bell/phone within reach  GO Functional Assessment Tool Used: clinical observation Functional Limitation: Self care Self Care Current Status [id number]): At least 1 percent but less than 20 percent impaired, limited or restricted Self Care Goal Status [id number]): 0 percent impaired, limited or restricted   [place], Thereasa Parkin 05/28/2013, 4:31 PM

## 2013-05-29 DIAGNOSIS — R112 Nausea with vomiting, unspecified: Secondary | ICD-10-CM | POA: Diagnosis not present

## 2013-05-29 DIAGNOSIS — E119 Type 2 diabetes mellitus without complications: Secondary | ICD-10-CM | POA: Diagnosis not present

## 2013-05-29 DIAGNOSIS — K219 Gastro-esophageal reflux disease without esophagitis: Secondary | ICD-10-CM | POA: Diagnosis not present

## 2013-05-29 LAB — CBC
MCH: 28.1 pg (ref 26.0–34.0)
Platelets: 196 10*3/uL (ref 150–400)
RBC: 3.81 MIL/uL — ABNORMAL LOW (ref 3.87–5.11)

## 2013-05-29 LAB — GLUCOSE, CAPILLARY: Glucose-Capillary: 135 mg/dL — ABNORMAL HIGH (ref 70–99)

## 2013-05-29 MED ORDER — POLYETHYLENE GLYCOL 3350 17 G PO PACK
17.0000 g | PACK | Freq: Every day | ORAL | Status: DC
  Administered 2013-05-29 – 2013-05-30 (×2): 17 g via ORAL
  Filled 2013-05-29 (×2): qty 1

## 2013-05-29 MED ORDER — PANTOPRAZOLE SODIUM 40 MG PO TBEC
40.0000 mg | DELAYED_RELEASE_TABLET | Freq: Two times a day (BID) | ORAL | Status: DC
  Administered 2013-05-29 – 2013-05-30 (×2): 40 mg via ORAL
  Filled 2013-05-29 (×3): qty 1

## 2013-05-29 NOTE — Progress Notes (Signed)
Occupational Therapy Treatment Patient Details Name: TRULIE GUARD MRN: [id number] DOB: [birth year] Today's Date: 05/29/2013 Time: 1110-1120 OT Time Calculation (min): 10 min  OT Assessment / Plan / Recommendation Comments on Treatment Session pt improved this OT visit    Follow Up Recommendations  Home health OT       Equipment Recommendations  None recommended by OT          Plan Discharge plan remains appropriate    Precautions / Restrictions Precautions Precautions: Fall Precaution Comments: very mild fall risk Restrictions Weight Bearing Restrictions: No       ADL  Grooming: Performed;Wash/dry face;Wash/dry hands;Supervision/safety Where Assessed - Grooming: Unsupported standing Upper Body Dressing: Performed;Supervision/safety Where Assessed - Upper Body Dressing: Unsupported sit to stand Lower Body Dressing: Performed;Supervision/safety Where Assessed - Lower Body Dressing: Unsupported standing Toilet Transfer: Performed;Supervision/safety Toilet Transfer Method: Sit to Loss adjuster, chartered: Comfort height toilet      OT Goals ADL Goals ADL Goal: Grooming - Progress: Progressing toward goals ADL Goal: Upper Body Dressing - Progress: Progressing toward goals ADL Goal: Lower Body Dressing - Progress: Progressing toward goals ADL Goal: Toilet Transfer - Progress: Progressing toward goals ADL Goal: Toileting - Clothing Manipulation - Progress: Progressing toward goals  Visit Information  Last OT Received On: 05/29/13 Assistance Needed: +1    Subjective Data  Subjective: I am feeling much better today- but didnt sleep well      Cognition  Cognition Arousal/Alertness: Awake/alert Behavior During Therapy: WFL for tasks assessed/performed Overall Cognitive Status: Within Functional Limits for tasks assessed    Mobility  Bed Mobility Bed Mobility: Supine to Sit Supine to Sit: 6: Modified independent (Device/Increase  time) Transfers Transfers: Sit to Stand;Stand to Sit Sit to Stand: 5: Supervision;From bed;From chair/3-in-1;From toilet;With upper extremity assist Stand to Sit: 5: Supervision;To toilet;With upper extremity assist          End of Session OT - End of Session Activity Tolerance: Patient tolerated treatment well Patient left: in bed;with call bell/phone within reach  GO Functional Assessment Tool Used: clinical observatino Functional Limitation: Self care Self Care Current Status [id number]): At least 1 percent but less than 20 percent impaired, limited or restricted Self Care Goal Status [id number]): 0 percent impaired, limited or restricted   [place], Thereasa Parkin 05/29/2013, 11:24 AM

## 2013-05-29 NOTE — Progress Notes (Signed)
[facility] PROGRESS NOTE Interim History: 79 y.o. female with PMH of diabetes mellitus, GERD/gastritis/duodenitis, stricture and stenosis of esopagus, hypertension and multiple medical problems who was admitted on 05/27/13. She gives 3 week history of generalized weakness and poor appetite but denies abdominal pain. She had 3 episodes of vomiting on the day of admission. She denies dysphagia. She also denies fevers, cough, chest pain, dysuria melena and no hematochezia. She went to her PCPs office on 6/20, was directly admitted for the symptoms as she appeared clinically dehydrated per PCP    Assessment/Plan:  Nausea and vomiting:  - No further episodes of nausea and vomiting since admission.  - started empirically on protonix BID. - Advance diet and tolerated it. - Hbg stable. Will continue protonix BID as an outpatient. - d/w pt and at her age will be wise to treated conservative as long as she is not actively bleeding. She has agreed. - miralax, no BM since admission.  Sinus bradycardia:  - Asx. - Heart rates in the 40s at rest and increased to 90s with activity. Patient not on rate limiting medications.  - TSH 1.3  Generalized weakness:  - Possibly secondary to problem #1 v -  PT evaluation Rec home PT.  Dehydration:  - Secondary to nausea and vomiting.  - Brief IV fluids and monitor.   Type II DM with hypoglycemia:  - Patient had CBGs in the 60s on 6/20 evening. Improved. Monitor closely. Metformin held on admission. Resume as an outpatient.  Hypertension:  - Controlled.   History of bilateral lower extremity DVT and PE:  - Continue anticoagulation with Xarelto   History of Crohn's disease:  - Management per problem #1   Anemia:  - Chronic and stable. - no sign of overt bleeding.   Code Status: Full  Family Communication: None  Disposition Plan: Home when medically  stable.    Consultants:  cardiology  Procedures:  none  Antibiotics:  None  HPI/Subjective: She relates she feels 100% better than when she came in. Did not eat much this am NO BM since admission.   Objective: Filed Vitals:   05/28/13 0800 05/28/13 1550 05/28/13 2130 05/29/13 0523  BP:  141/48 116/59 148/70  Pulse:  54 57 60  Temp:  98.1 F (36.7 C) 98.2 F (36.8 C) 97.6 F (36.4 C)  TempSrc:  Oral Oral Oral  Resp:  18 16 16   Height:      Weight:      SpO2: 98% 100% 97% 97%    Intake/Output Summary (Last 24 hours) at 05/29/13 0921 Last data filed at 05/29/13 0740  Gross per 24 hour  Intake 1806.66 ml  Output    850 ml  Net 956.66 ml   Filed Weights   05/27/13 1500 05/27/13 1819  Weight: 52.164 kg (115 lb) 52.1 kg (114 lb 13.8 oz)    Exam:  General: Alert, awake, oriented x3, in no acute distress.  HEENT: No bruits, no goiter.  Heart: Regular rate and rhythm, without murmurs, rubs, gallops.  Lungs: Good air movement, clear to auscultation. Abdomen: Soft, nontender, nondistended, positive bowel sounds.  Neuro: Grossly intact, nonfocal.   Data Reviewed: Basic Metabolic Panel:  Recent Labs Lab 05/27/13 1719 05/28/13 0425  NA 141 142  K 4.1 3.5  CL 104 107  CO2 30 28  GLUCOSE 101* 109*  BUN 21 17  CREATININE 1.00 0.96  CALCIUM 9.8 8.6   Liver Function Tests:  Recent Labs Lab 05/27/13 1719  AST 20  ALT 11  ALKPHOS 87  BILITOT 0.5  PROT 6.6  ALBUMIN 3.7    Recent Labs Lab 05/27/13 1719  LIPASE 11   No results found for this basename: AMMONIA,  in the last 168 hours CBC:  Recent Labs Lab 05/27/13 1719 05/28/13 0425 05/29/13 0542  WBC 5.9 4.7 6.2  HGB 11.2* 9.1* 10.7*  HCT 36.0 29.1* 33.1*  MCV 88.5 88.4 86.9  PLT 220 188 196   Cardiac Enzymes:  Recent Labs Lab 05/27/13 1719 05/27/13 2244 05/28/13 0425  TROPONINI <0.30 <0.30 <0.30   BNP (last 3 results) No results found for this basename: PROBNP,  in the last  8760 hours CBG:  Recent Labs Lab 05/28/13 0739 05/28/13 1148 05/28/13 1640 05/28/13 2110 05/29/13 0819  GLUCAP 94 73 87 115* 91    No results found for this or any previous visit (from the past 240 hour(s)).   Studies: Portable Chest 1 View  05/27/2013   *RADIOLOGY REPORT*  Clinical Data: Evaluate infiltrate  PORTABLE CHEST - 1 VIEW  Comparison: Chest radiograph 04/20/2013  Findings: Normal cardiac silhouette with ectatic aorta.  There is a hiatal hernia.  The lungs are hyperinflated.  No effusion, infiltrate, or pneumothorax.  Some scarring at the left lung base.  IMPRESSION: 1. No focal infiltrate. 2.  Scarring at the left lung base   Original Report Authenticated By: Suzy Bouchard, M.D.    Scheduled Meds: . feeding supplement  1 Container Oral TID BM  . insulin aspart  0-9 Units Subcutaneous TID WC  . l-methylfolate-B6-B12  1 tablet Oral BID  . losartan  25 mg Oral Daily  . mirtazapine  15 mg Oral QHS  . pantoprazole  40 mg Oral BID  . polyethylene glycol  17 g Oral Daily  . Rivaroxaban  20 mg Oral Q supper  . rOPINIRole  6 mg Oral QHS  . sodium chloride  3 mL Intravenous Q12H  . [START ON 05/30/2013] Vitamin D (Ergocalciferol)  50,000 Units Oral 2 times weekly   Continuous Infusions:    Charlynne Cousins  [facility] Pager [phone]. If 8PM-8AM, please contact night-coverage at [email or web address], password [facility] 05/29/2013, 9:21 AM  LOS: 2 days

## 2013-05-30 DIAGNOSIS — R112 Nausea with vomiting, unspecified: Secondary | ICD-10-CM | POA: Diagnosis not present

## 2013-05-30 DIAGNOSIS — E119 Type 2 diabetes mellitus without complications: Secondary | ICD-10-CM | POA: Diagnosis not present

## 2013-05-30 DIAGNOSIS — I10 Essential (primary) hypertension: Secondary | ICD-10-CM | POA: Diagnosis not present

## 2013-05-30 DIAGNOSIS — K219 Gastro-esophageal reflux disease without esophagitis: Secondary | ICD-10-CM | POA: Diagnosis not present

## 2013-05-30 LAB — GLUCOSE, CAPILLARY
Glucose-Capillary: 131 mg/dL — ABNORMAL HIGH (ref 70–99)
Glucose-Capillary: 119 mg/dL — ABNORMAL HIGH (ref 70–99)

## 2013-05-30 MED ORDER — PANTOPRAZOLE SODIUM 40 MG PO TBEC: 40.0000 mg | DELAYED_RELEASE_TABLET | Freq: Two times a day (BID) | ORAL | Status: DC

## 2013-05-30 NOTE — Discharge Summary (Signed)
Physician Discharge Summary  MAGDALINE ZOLLARS KJZ:[id number] DOB: Jun 22, 1919 DOA: 05/27/2013  PCP: Benito Mccreedy, MD  Admit date: 05/27/2013 Discharge date: 05/30/2013  Time spent: 35 minutes  Recommendations for Outpatient Follow-up:  1. Follow up with PCP in 3 weeks.  Discharge Diagnoses:  Active Problems:   HYPERTENSION   GERD   GASTRITIS, ACUTE   DUODENITIS   Polymyalgia rheumatica   CROHN'S DISEASE, HX OF   Weakness generalized   DVT, bilateral lower limbs   Nausea with vomiting   Volume depletion   Sinus bradycardia   Discharge Condition: stable  Diet recommendation: heart healthy diet  Filed Weights   05/27/13 1500 05/27/13 1819  Weight: 52.164 kg (115 lb) 52.1 kg (114 lb 13.8 oz)    History of present illness:  79 y.o. female history of diabetes mellitus, GERD/gastritis/duodenitis, stricture and stenosis of esopagus, hypertension and multiple medical problems as listed below who presents with above complaints. She states that last week she had some nausea vomiting and this morning she began vomiting again-x3 so far today, nonbloody. She denies abdominal pain and no diarrhea. She also denies fevers, cough, chest pain, dysuria melena and no hematochezia. She states in the past 2 weeks she has had generalized weakness which has been worsening. She went to her PCPs office today, was directly admitted for the symptoms as she appeared clinically dehydrated per PCP. No labs done in the office today.   Hospital Course:  Nausea and vomiting:  - started empirically on protonix BID.  - Hbg remainedstable. - No further episodes of nausea and vomiting since admission. Will continue protonix BID as an outpatient.  - d/w pt and at her age will be wise to treated conservative as long as she is not actively bleeding. She has agreed.   Sinus bradycardia:  - Asx.  - Heart rates in the 40s at rest and increased to 90s with activity. Patient not on rate limiting medications.  - TSH  1.3.  Generalized weakness:  - Possibly secondary to problem #1 and dehydration.  - PT evaluation Rec home PT.   Dehydration:  - Secondary to nausea and vomiting.  - Brief IV fluids.  Type II DM with hypoglycemia:  - Patient had CBGs in the 60s on 6/20 evening. Improved. Monitor closely. Metformin held on admission. Resume as an outpatient.   Hypertension:  - Controlled.   History of bilateral lower extremity DVT and PE:  - Continue anticoagulation with Xarelto   History of Crohn's disease:  - Management per problem #1   Anemia:  - Chronic and stable.  - no sign of overt bleeding.   Procedures:  None  Consultations:  none  Discharge Exam: Filed Vitals:   05/29/13 0523 05/29/13 1356 05/29/13 2205 05/30/13 0711  BP: 148/70 142/48 155/63 110/51  Pulse: 60 60 57 51  Temp: 97.6 F (36.4 C) 98.1 F (36.7 C) 97.7 F (36.5 C) 97.4 F (36.3 C)  TempSrc: Oral Oral Oral Axillary  Resp: 16 14 16 14   Height:      Weight:      SpO2: 97% 96% 99% 98%    General: A&O x3 Cardiovascular: RRR Respiratory: good air movement CTA B/L  Discharge Instructions  Discharge Orders   Future Appointments Provider Department Dept Phone   06/23/2013 11:30 AM [facility] [place] [phone]   Patient to arrive 15 minutes prior to appointment time.   06/23/2013 1:15 PM [facility]wendolyn A [facility] [place] [phone]   06/23/2013 1:45 PM Volanda Napoleon, MD [place] [phone]   Future Orders Complete By Expires     Diet - low sodium heart healthy  As directed     Increase activity slowly  As directed         Medication List    TAKE these medications       cyanocobalamin 1000 MCG/ML injection  Commonly known as:  (VITAMIN B-12)  Inject 1,000 mcg into the muscle every 30 (thirty) days.     ergocalciferol 50000 UNITS capsule  Commonly known as:  VITAMIN D2  Take 50,000 Units by mouth 2  (two) times a week. Wednesday and Sunday     l-methylfolate-B6-B12 3-35-2 MG Tabs  Commonly known as:  METANX  Take 1 tablet by mouth 2 (two) times daily.     losartan 25 MG tablet  Commonly known as:  COZAAR  Take 25 mg by mouth daily.     metFORMIN 500 MG tablet  Commonly known as:  GLUCOPHAGE  Take 500 mg by mouth 2 (two) times daily with a meal.     mirtazapine 15 MG tablet  Commonly known as:  REMERON  Take 15 mg by mouth at bedtime.     oxyCODONE 5 MG immediate release tablet  Commonly known as:  Oxy IR/ROXICODONE  Take 5-10 mg by mouth every 6 (six) hours as needed for pain.     pantoprazole 40 MG tablet  Commonly known as:  PROTONIX  Take 1 tablet (40 mg total) by mouth 2 (two) times daily.     REQUIP XL 6 MG Tb24  Generic drug:  Ropinirole HCl  Take 1 tablet by mouth every evening.     Rivaroxaban 20 MG Tabs  Commonly known as:  XARELTO  Take 1 tablet (20 mg total) by mouth daily with supper.     tiZANidine 2 MG tablet  Commonly known as:  ZANAFLEX  Take 2-4 mg by mouth every 8 (eight) hours as needed (for muscle pain).       Allergies  Allergen Reactions  . Sulfa Antibiotics Nausea And Vomiting  . Penicillins Hives and Rash       Follow-up Information   Follow up with OSEI-BONSU,GEORGE, MD In 3 weeks. (follow up with PCP 3 weeks.)    Contact information:   [address], [address] [place] [address] [phone]        The results of significant diagnostics from this hospitalization (including imaging, microbiology, ancillary and laboratory) are listed below for reference.    Significant Diagnostic Studies: Portable Chest 1 View  05/27/2013   *RADIOLOGY REPORT*  Clinical Data: Evaluate infiltrate  PORTABLE CHEST - 1 VIEW  Comparison: Chest radiograph 04/20/2013  Findings: Normal cardiac silhouette with ectatic aorta.  There is a hiatal hernia.  The lungs are hyperinflated.  No effusion, infiltrate, or pneumothorax.  Some scarring at the  left lung base.  IMPRESSION: 1. No focal infiltrate. 2.  Scarring at the left lung base   Original Report Authenticated By: Suzy Bouchard, M.D.    Microbiology: No results found for this or any previous visit (from the past 240 hour(s)).   Labs: Basic Metabolic Panel:  Recent Labs Lab 05/27/13 1719 05/28/13 0425  NA 141 142  K 4.1 3.5  CL 104 107  CO2 30 28  GLUCOSE 101* 109*  BUN 21 17  CREATININE 1.00 0.96  CALCIUM 9.8 8.6   Liver Function  Tests:  Recent Labs Lab 05/27/13 1719  AST 20  ALT 11  ALKPHOS 87  BILITOT 0.5  PROT 6.6  ALBUMIN 3.7    Recent Labs Lab 05/27/13 1719  LIPASE 11   No results found for this basename: AMMONIA,  in the last 168 hours CBC:  Recent Labs Lab 05/27/13 1719 05/28/13 0425 05/29/13 0542  WBC 5.9 4.7 6.2  HGB 11.2* 9.1* 10.7*  HCT 36.0 29.1* 33.1*  MCV 88.5 88.4 86.9  PLT 220 188 196   Cardiac Enzymes:  Recent Labs Lab 05/27/13 1719 05/27/13 2244 05/28/13 0425  TROPONINI <0.30 <0.30 <0.30   BNP: BNP (last 3 results) No results found for this basename: PROBNP,  in the last 8760 hours CBG:  Recent Labs Lab 05/29/13 0819 05/29/13 1155 05/29/13 1656 05/29/13 2158 05/30/13 0751  GLUCAP 91 102* 135* 131* 119*     Signed:  FELIZ ORTIZ, Ramsay Bognar  [facility] 05/30/2013, 9:41 AM

## 2013-05-30 NOTE — Progress Notes (Signed)
Occupational Therapy Treatment Patient Details Name: [name] MRN: [id number] DOB: 12/15/1918 Today's Date: 05/30/2013 Time: [id number] OT Time Calculation (min): 10 min  OT Assessment / Plan / Recommendation Comments on Treatment Session goals met    Follow Up Recommendations  Home health OT       Equipment Recommendations  None recommended by OT          Plan Discharge plan remains appropriate    Precautions / Restrictions Precautions Precautions: None Restrictions Weight Bearing Restrictions: No       ADL  Upper Body Dressing: Performed;Modified independent Where Assessed - Upper Body Dressing: Unsupported sit to stand Lower Body Dressing: Performed;Modified independent Where Assessed - Lower Body Dressing: Unsupported sit to stand Toilet Transfer: Performed;Modified independent Toilet Transfer Method: Sit to Loss adjuster, chartered: Comfort height toilet Toileting - Clothing Manipulation and Hygiene: Performed;Modified independent Where Assessed - Toileting Clothing Manipulation and Hygiene: Standing ADL Comments: Pt did well using walker and will use it at home rather than cane.        OT Goals ADL Goals ADL Goal: Grooming - Progress: Met ADL Goal: Upper Body Dressing - Progress: Met ADL Goal: Lower Body Dressing - Progress: Met ADL Goal: Toilet Transfer - Progress: Met ADL Goal: Toileting - Clothing Manipulation - Progress: Met  Visit Information  Last OT Received On: 05/30/13    Subjective Data  Subjective: I am ready to go home. My helper, [name] will stay with me for a few days      Cognition  Cognition Arousal/Alertness: Awake/alert Behavior During Therapy: WFL for tasks assessed/performed Overall Cognitive Status: Within Functional Limits for tasks assessed    Mobility  Bed Mobility Bed Mobility: Supine to Sit Supine to Sit: 6: Modified independent (Device/Increase time) Transfers Transfers: Sit to Stand;Stand to Sit Sit to  Stand: 6: Modified independent (Device/Increase time);With upper extremity assist;From bed;From toilet;From chair/3-in-1 Stand to Sit: 6: Modified independent (Device/Increase time);With upper extremity assist;To chair/3-in-1;To toilet;To bed          End of Session OT - End of Session Activity Tolerance: Patient tolerated treatment well Patient left: in bed  GO     Lakara Weiland, Thereasa Parkin 05/30/2013, 11:15 AM

## 2013-06-20 DIAGNOSIS — F329 Major depressive disorder, single episode, unspecified: Secondary | ICD-10-CM | POA: Diagnosis not present

## 2013-06-20 DIAGNOSIS — I824Z9 Acute embolism and thrombosis of unspecified deep veins of unspecified distal lower extremity: Secondary | ICD-10-CM | POA: Diagnosis not present

## 2013-06-20 DIAGNOSIS — I2699 Other pulmonary embolism without acute cor pulmonale: Secondary | ICD-10-CM | POA: Diagnosis not present

## 2013-06-20 DIAGNOSIS — G894 Chronic pain syndrome: Secondary | ICD-10-CM | POA: Diagnosis not present

## 2013-06-20 DIAGNOSIS — G609 Hereditary and idiopathic neuropathy, unspecified: Secondary | ICD-10-CM | POA: Diagnosis not present

## 2013-06-20 DIAGNOSIS — E119 Type 2 diabetes mellitus without complications: Secondary | ICD-10-CM | POA: Diagnosis not present

## 2013-06-20 DIAGNOSIS — E538 Deficiency of other specified B group vitamins: Secondary | ICD-10-CM | POA: Diagnosis not present

## 2013-06-20 DIAGNOSIS — I10 Essential (primary) hypertension: Secondary | ICD-10-CM | POA: Diagnosis not present

## 2013-06-20 DIAGNOSIS — F3289 Other specified depressive episodes: Secondary | ICD-10-CM | POA: Diagnosis not present

## 2013-06-23 ENCOUNTER — Other Ambulatory Visit: Payer: Medicare Other | Admitting: Lab

## 2013-06-23 ENCOUNTER — Ambulatory Visit: Payer: Medicare Other | Admitting: Hematology & Oncology

## 2013-06-23 ENCOUNTER — Telehealth: Payer: Self-pay | Admitting: Hematology & Oncology

## 2013-06-23 ENCOUNTER — Ambulatory Visit (HOSPITAL_BASED_OUTPATIENT_CLINIC_OR_DEPARTMENT_OTHER): Admission: RE | Admit: 2013-06-23 | Payer: Medicare Other | Source: Ambulatory Visit

## 2013-06-23 NOTE — Telephone Encounter (Signed)
Patient called and cx 06/23/13 apt due to not feeling well.  She stated she will call back to resch

## 2013-07-12 DIAGNOSIS — M545 Low back pain, unspecified: Secondary | ICD-10-CM | POA: Diagnosis not present

## 2013-07-12 DIAGNOSIS — M542 Cervicalgia: Secondary | ICD-10-CM | POA: Diagnosis not present

## 2013-07-14 DIAGNOSIS — I10 Essential (primary) hypertension: Secondary | ICD-10-CM | POA: Diagnosis not present

## 2013-07-14 DIAGNOSIS — I2699 Other pulmonary embolism without acute cor pulmonale: Secondary | ICD-10-CM | POA: Diagnosis not present

## 2013-07-14 DIAGNOSIS — E119 Type 2 diabetes mellitus without complications: Secondary | ICD-10-CM | POA: Diagnosis not present

## 2013-07-14 DIAGNOSIS — F3289 Other specified depressive episodes: Secondary | ICD-10-CM | POA: Diagnosis not present

## 2013-07-14 DIAGNOSIS — R079 Chest pain, unspecified: Secondary | ICD-10-CM | POA: Diagnosis not present

## 2013-07-14 DIAGNOSIS — F329 Major depressive disorder, single episode, unspecified: Secondary | ICD-10-CM | POA: Diagnosis not present

## 2013-07-14 DIAGNOSIS — I824Z9 Acute embolism and thrombosis of unspecified deep veins of unspecified distal lower extremity: Secondary | ICD-10-CM | POA: Diagnosis not present

## 2013-07-14 DIAGNOSIS — E538 Deficiency of other specified B group vitamins: Secondary | ICD-10-CM | POA: Diagnosis not present

## 2013-07-14 DIAGNOSIS — G609 Hereditary and idiopathic neuropathy, unspecified: Secondary | ICD-10-CM | POA: Diagnosis not present

## 2013-07-15 DIAGNOSIS — R935 Abnormal findings on diagnostic imaging of other abdominal regions, including retroperitoneum: Secondary | ICD-10-CM | POA: Diagnosis not present

## 2013-07-15 DIAGNOSIS — R109 Unspecified abdominal pain: Secondary | ICD-10-CM | POA: Diagnosis not present

## 2013-07-15 DIAGNOSIS — R079 Chest pain, unspecified: Secondary | ICD-10-CM | POA: Diagnosis not present

## 2013-07-18 DIAGNOSIS — I10 Essential (primary) hypertension: Secondary | ICD-10-CM | POA: Diagnosis not present

## 2013-07-18 DIAGNOSIS — E119 Type 2 diabetes mellitus without complications: Secondary | ICD-10-CM | POA: Diagnosis not present

## 2013-07-18 DIAGNOSIS — G609 Hereditary and idiopathic neuropathy, unspecified: Secondary | ICD-10-CM | POA: Diagnosis not present

## 2013-07-18 DIAGNOSIS — I824Z9 Acute embolism and thrombosis of unspecified deep veins of unspecified distal lower extremity: Secondary | ICD-10-CM | POA: Diagnosis not present

## 2013-07-18 DIAGNOSIS — I2699 Other pulmonary embolism without acute cor pulmonale: Secondary | ICD-10-CM | POA: Diagnosis not present

## 2013-07-18 DIAGNOSIS — E538 Deficiency of other specified B group vitamins: Secondary | ICD-10-CM | POA: Diagnosis not present

## 2013-07-18 DIAGNOSIS — R1084 Generalized abdominal pain: Secondary | ICD-10-CM | POA: Diagnosis not present

## 2013-07-18 DIAGNOSIS — J189 Pneumonia, unspecified organism: Secondary | ICD-10-CM | POA: Diagnosis not present

## 2013-07-28 ENCOUNTER — Ambulatory Visit (INDEPENDENT_AMBULATORY_CARE_PROVIDER_SITE_OTHER): Payer: Medicare Other | Admitting: Gastroenterology

## 2013-07-28 ENCOUNTER — Encounter: Payer: Self-pay | Admitting: Gastroenterology

## 2013-07-28 ENCOUNTER — Telehealth: Payer: Self-pay | Admitting: Internal Medicine

## 2013-07-28 VITALS — BP 90/60 | HR 68 | Ht 62.0 in | Wt 105.0 lb

## 2013-07-28 DIAGNOSIS — R634 Abnormal weight loss: Secondary | ICD-10-CM

## 2013-07-28 DIAGNOSIS — R933 Abnormal findings on diagnostic imaging of other parts of digestive tract: Secondary | ICD-10-CM | POA: Diagnosis not present

## 2013-07-28 DIAGNOSIS — R112 Nausea with vomiting, unspecified: Secondary | ICD-10-CM

## 2013-07-28 NOTE — Progress Notes (Signed)
07/28/2013 PRESLEA BRASCH [id number] 1919/07/31   History of Present Illness:  Patient is a 79 year old female who presents to our office today with her son.  She is known to Dr. Henrene Pastor.  She comes to the office today with complaints of pain on her left side, nausea, vomiting, and weight loss of 25 pounds.  She says that about 3 weeks ago she woke up with severe pain in her left side.  She says that she has lost 25 pounds in the last couple of months.  She saw her PCP who ordered a CT scan of her chest and abdomen.  This showed possible pneumonia for which she was treated with a course of avelox.  Still no improvement in the pain.  CT scan also showed marked wall thickening and irregularity of the gastric fundus at the GE junction level, suspicious for an infiltrating mass.    Just of note, she's had multiple DVT's and PE's as late as March of this year.  She was on Xarelto, and as of discharge summary in June of this year she was to continue this medication.  She is not currently on the medication, however, and is unsure why she discontinued it.  I spoke with PCP's nurse and they are supposed to address the issue.  Current Medications, Allergies, Past Medical History, Past Surgical History, Family History and Social History were reviewed in [employer] record.   Physical Exam: BP 90/60  Pulse 68  Ht 5\' 2"  (1.575 m)  Wt 105 lb (47.628 kg)  BMI 19.2 kg/m2 General:  Elderly white female in no acute distress Head: Normocephalic and atraumatic Eyes:  sclerae anicteric, conjunctiva pink  Ears: Normal auditory acuity Chest/Lungs: Clear throughout to auscultation.  Tenderness to palpation over the patient's left lateral ribs. Heart: Regular rate and rhythm Abdomen: Soft, non-tender and non-distended. No masses, no hepatomegaly. Normal bowel sounds. Musculoskeletal: Symmetrical with no gross deformities  Extremities:  Edema noted in B/L LE's L>R. Neurological: Alert oriented x  4, grossly nonfocal Psychological:  Alert and cooperative. Normal mood and affect  Assessment and Recommendations: -Nausea, vomiting, weight loss of 25 pounds over a couple of months time, and abnormal CT of the stomach concerning for infiltrating mass -Rib pain:  ? Related to the gastric mass vs costochondritis or other musculoskeletal issue. -Multiple PE's and DVT's:  Was previously on Xarelto and I believe that she should still be on the medication.  I contacted her PCP's office to discuss this issue and told them to follow-up with the PCP about this as well.  *EGD tomorrow with Dr. Henrene Pastor. *Can continue pain medication for now. *Xarelto issue to be addressed by PCP.

## 2013-07-28 NOTE — Telephone Encounter (Signed)
Pt states she woke up this morning and is having abdominal pain on her left side just below her ribs. Pt has OV scheduled with Dr. Henrene Pastor but states she cannot wait until that appt, would like to be seen today. Pt scheduled to see Alonza Bogus PA today at 1:30pm. Pt aware of appt.

## 2013-07-28 NOTE — Patient Instructions (Addendum)
You have been scheduled for an endoscopy with propofol. Please follow written instructions given to you at your visit today. If you use inhalers (even only as needed), please bring them with you on the day of your procedure.  We have called your family MD, Dr. Iona Beard Osei-Bonsu and his nurse will call us back regarding the Xarelto medication.  They may call you as well.

## 2013-07-28 NOTE — Progress Notes (Signed)
Agree with initial assessment and plans as outlined. Discussed with PAC

## 2013-07-29 ENCOUNTER — Encounter: Payer: Self-pay | Admitting: Internal Medicine

## 2013-07-29 ENCOUNTER — Ambulatory Visit (AMBULATORY_SURGERY_CENTER): Payer: Medicare Other | Admitting: Internal Medicine

## 2013-07-29 VITALS — BP 149/61 | HR 64 | Temp 99.4°F | Resp 15 | Ht 62.0 in | Wt 105.0 lb

## 2013-07-29 DIAGNOSIS — K219 Gastro-esophageal reflux disease without esophagitis: Secondary | ICD-10-CM

## 2013-07-29 DIAGNOSIS — R634 Abnormal weight loss: Secondary | ICD-10-CM

## 2013-07-29 DIAGNOSIS — K222 Esophageal obstruction: Secondary | ICD-10-CM

## 2013-07-29 DIAGNOSIS — R933 Abnormal findings on diagnostic imaging of other parts of digestive tract: Secondary | ICD-10-CM

## 2013-07-29 DIAGNOSIS — K5090 Crohn's disease, unspecified, without complications: Secondary | ICD-10-CM | POA: Diagnosis not present

## 2013-07-29 DIAGNOSIS — R112 Nausea with vomiting, unspecified: Secondary | ICD-10-CM | POA: Diagnosis not present

## 2013-07-29 DIAGNOSIS — G4733 Obstructive sleep apnea (adult) (pediatric): Secondary | ICD-10-CM | POA: Diagnosis not present

## 2013-07-29 DIAGNOSIS — I10 Essential (primary) hypertension: Secondary | ICD-10-CM | POA: Diagnosis not present

## 2013-07-29 MED ORDER — SODIUM CHLORIDE 0.9 % IV SOLN: 500.0000 mL | INTRAVENOUS | Status: DC

## 2013-07-29 MED ORDER — DEXTROSE 5 % IV SOLN: INTRAVENOUS | Status: DC

## 2013-07-29 NOTE — Progress Notes (Signed)
Patient did not experience any of the following events: a burn prior to discharge; a fall within the facility; wrong site/side/patient/procedure/implant event; or a hospital transfer or hospital admission upon discharge from the facility. (G8907) Patient did not have preoperative order for IV antibiotic SSI prophylaxis. (G8918)  

## 2013-07-29 NOTE — Patient Instructions (Addendum)

## 2013-07-29 NOTE — Progress Notes (Signed)
Report to pacu rn, vss, bbs=clear 

## 2013-07-29 NOTE — Op Note (Signed)
[place]  [address]. [place], [id number]   ENDOSCOPY PROCEDURE REPORT  PATIENT: [name], [name]  MR#: [id number] BIRTHDATE: 1919/02/17 , 89  yrs. old GENDER: Female ENDOSCOPIST: Eustace Quail, MD REFERRED BY:  .  Self / Office PROCEDURE DATE:  07/29/2013 PROCEDURE:  EGD, diagnostic ASA CLASS:     Class III INDICATIONS:  abnormal CT of the GI tract.  Denies dysphagia MEDICATIONS: MAC sedation, administered by CRNA and propofol (Diprivan) 60mg  IV TOPICAL ANESTHETIC: none  DESCRIPTION OF PROCEDURE: After the risks benefits and alternatives of the procedure were thoroughly explained, informed consent was obtained.  The LB [id number] endoscope was introduced through the mouth and advanced to the second portion of the duodenum. Without limitations.  The instrument was slowly withdrawn as the mucosa was fully examined.      The upper, middle and distal third of the esophagus were carefully inspected and no abnormalities were noted except for a benign ring-like stricture at GEJ..  The z-line was well seen at the GEJ. The endoscope was pushed into the fundus which was normal including a retroflexed view.  The antrum, gastric body, first and second part of the duodenum were unremarkable.  Retroflexed views revealed a hiatal hernia.     The scope was then withdrawn from the patient and the procedure completed.  COMPLICATIONS: There were no complications. ENDOSCOPIC IMPRESSION: 1. Esopheal stricture 2. GERD 3. Normal EGD otherwise. No gastric cancer  RECOMMENDATIONS: 1. Continue PPI 2. Return to PCP for follow up of treatment of pneumonia and left chest wall/rib pain  REPEAT EXAM:  eSigned:  Eustace Quail, MD 07/29/2013 3:29 PM   [id number], Iona Beard MD and The Patient

## 2013-08-01 ENCOUNTER — Telehealth: Payer: Self-pay

## 2013-08-01 NOTE — Telephone Encounter (Signed)
  Follow up Call-  Call back number 07/29/2013 04/03/2011  Post procedure Call Back phone  # [phone]  Permission to leave phone message Yes -     Patient questions:  Do you have a fever, pain , or abdominal swelling? yes Pain Score  7 *  Have you tolerated food without any problems? yes  Have you been able to return to your normal activities? no  Do you have any questions about your discharge instructions: Diet   no Medications  no Follow up visit  no  Do you have questions or concerns about your Care? no  Actions: * If pain score is 4 or above: No action needed, pain <4.  Per the pt she said, "I am still hurting like I was".  I asked if it was the same place and she said, " yes, my left side".  I told her Dr. Henrene Pastor recommended she return to her PCP.  She said she would call them this morning. Maw

## 2013-08-03 ENCOUNTER — Ambulatory Visit: Payer: Medicare Other | Admitting: Internal Medicine

## 2013-08-10 ENCOUNTER — Encounter: Payer: Self-pay | Admitting: Internal Medicine

## 2013-08-13 DIAGNOSIS — M7989 Other specified soft tissue disorders: Secondary | ICD-10-CM | POA: Diagnosis not present

## 2013-08-14 ENCOUNTER — Emergency Department (HOSPITAL_COMMUNITY)
Admission: EM | Admit: 2013-08-14 | Discharge: 2013-08-14 | Disposition: A | Discharge status: Home / Self Care | Payer: Medicare Other | Attending: Emergency Medicine | Admitting: Emergency Medicine

## 2013-08-14 ENCOUNTER — Encounter (HOSPITAL_COMMUNITY): Payer: Self-pay | Admitting: Emergency Medicine

## 2013-08-14 DIAGNOSIS — Z8719 Personal history of other diseases of the digestive system: Secondary | ICD-10-CM | POA: Diagnosis not present

## 2013-08-14 DIAGNOSIS — L0390 Cellulitis, unspecified: Secondary | ICD-10-CM

## 2013-08-14 DIAGNOSIS — Z86718 Personal history of other venous thrombosis and embolism: Secondary | ICD-10-CM | POA: Insufficient documentation

## 2013-08-14 DIAGNOSIS — L02419 Cutaneous abscess of limb, unspecified: Secondary | ICD-10-CM | POA: Insufficient documentation

## 2013-08-14 DIAGNOSIS — Z8739 Personal history of other diseases of the musculoskeletal system and connective tissue: Secondary | ICD-10-CM | POA: Insufficient documentation

## 2013-08-14 DIAGNOSIS — E119 Type 2 diabetes mellitus without complications: Secondary | ICD-10-CM | POA: Insufficient documentation

## 2013-08-14 DIAGNOSIS — Z79899 Other long term (current) drug therapy: Secondary | ICD-10-CM | POA: Insufficient documentation

## 2013-08-14 DIAGNOSIS — I10 Essential (primary) hypertension: Secondary | ICD-10-CM | POA: Diagnosis not present

## 2013-08-14 DIAGNOSIS — G2581 Restless legs syndrome: Secondary | ICD-10-CM | POA: Diagnosis not present

## 2013-08-14 DIAGNOSIS — G4730 Sleep apnea, unspecified: Secondary | ICD-10-CM | POA: Insufficient documentation

## 2013-08-14 DIAGNOSIS — R52 Pain, unspecified: Secondary | ICD-10-CM | POA: Diagnosis not present

## 2013-08-14 DIAGNOSIS — Z8639 Personal history of other endocrine, nutritional and metabolic disease: Secondary | ICD-10-CM | POA: Insufficient documentation

## 2013-08-14 DIAGNOSIS — Z862 Personal history of diseases of the blood and blood-forming organs and certain disorders involving the immune mechanism: Secondary | ICD-10-CM | POA: Insufficient documentation

## 2013-08-14 DIAGNOSIS — Z880 Allergy status to penicillin: Secondary | ICD-10-CM | POA: Diagnosis not present

## 2013-08-14 DIAGNOSIS — M7989 Other specified soft tissue disorders: Secondary | ICD-10-CM | POA: Diagnosis not present

## 2013-08-14 DIAGNOSIS — I749 Embolism and thrombosis of unspecified artery: Secondary | ICD-10-CM | POA: Diagnosis not present

## 2013-08-14 DIAGNOSIS — M79609 Pain in unspecified limb: Secondary | ICD-10-CM | POA: Diagnosis not present

## 2013-08-14 MED ORDER — CEPHALEXIN 500 MG PO CAPS
500.0000 mg | ORAL_CAPSULE | Freq: Once | ORAL | Status: AC
  Administered 2013-08-14: 500 mg via ORAL
  Filled 2013-08-14: qty 1

## 2013-08-14 MED ORDER — CEPHALEXIN 500 MG PO CAPS: 500.0000 mg | ORAL_CAPSULE | Freq: Four times a day (QID) | ORAL | Status: DC

## 2013-08-14 NOTE — ED Provider Notes (Signed)
CSN: [id number]     Arrival date & time 08/14/13  1505 History   First MD Initiated Contact with Patient 08/14/13 1519     Chief Complaint  Patient presents with  . Leg Swelling   (Consider location/radiation/quality/duration/timing/severity/associated sxs/prior Treatment) Patient is a 79 y.o. female presenting with leg pain. The history is provided by the patient and a relative. No language interpreter was used.  Leg Pain Location:  Leg and ankle Leg location:  L leg Ankle location:  L ankle Pain details:    Quality:  Aching   Radiates to:  Does not radiate   Severity:  Mild Associated symptoms: no fever   Associated symptoms comment:  She stood after sitting for 1-2 hours while reading and found that it was painful to stand on her left leg. She noticed moderate swelling around the ankle. No known direct injury. She has a history of multiple DVT's and is not currently on medication. She reports she was on Xarelto until about a month ago and stopped when there were no refills remaining. No chest pain or SOB.   Past Medical History  Diagnosis Date  . Cyst and pseudocyst of pancreas   . Unspecified essential hypertension   . Other and unspecified hyperlipidemia   . Polymyalgia rheumatica   . Personal history of unspecified digestive disease   . Restless legs syndrome (RLS)   . Insomnia, unspecified   . Other malaise and fatigue   . Diverticulosis of colon (without mention of hemorrhage)   . Acute gastritis without mention of hemorrhage   . Duodenitis without mention of hemorrhage   . Diaphragmatic hernia without mention of obstruction or gangrene   . Esophageal reflux   . Stricture and stenosis of esophagus   . OAB (overactive bladder)   . Left leg DVT jan 2013  . Unspecified sleep apnea     hx of sleep apnea, none since weight loss  . Diabetes mellitus    Past Surgical History  Procedure Laterality Date  . Oophorectomy  1944    not sure which ovary  . Rotator cuff repair   right     4-5 yrs ago  . Eye surgery      cataracts removed-bilateral eyes  . Back surgery  2009    lower back  . Colon surgery  June 17, 2012    surgery for bleed  . Shoulder open rotator cuff repair  08/04/2012    Procedure: ROTATOR CUFF REPAIR SHOULDER OPEN;  Surgeon: Tobi Bastos, MD;  Location: [facility];  Service: Orthopedics;  Laterality: Left;  with Anchors and Graft   Family History  Problem Relation Age of Onset  . Melanoma Daughter     died age 18  . Melanoma Son    History  Substance Use Topics  . Smoking status: Never Smoker   . Smokeless tobacco: Never Used  . Alcohol Use: No   OB History   Grav Para Term Preterm Abortions TAB SAB Ect Mult Living                 Review of Systems  Constitutional: Negative for fever.  Respiratory: Negative for shortness of breath.   Cardiovascular: Negative for chest pain.  Gastrointestinal: Negative for nausea and abdominal pain.  Genitourinary: Negative for dysuria.  Musculoskeletal:       See HPI.  Skin: Negative for wound.  Neurological: Negative for weakness and numbness.  Psychiatric/Behavioral: Negative for confusion.    Allergies  Sulfa antibiotics and  Penicillins  Home Medications   Current Outpatient Rx  Name  Route  Sig  Dispense  Refill  . cyanocobalamin (,VITAMIN B-12,) 1000 MCG/ML injection   Intramuscular   Inject 1,000 mcg into the muscle every 30 (thirty) days.         . ergocalciferol (VITAMIN D2) 50000 UNITS capsule   Oral   Take 50,000 Units by mouth 2 (two) times a week. Wednesday and Sunday         . L-Methylfolate-Algae-B12-B6 (METANX) 3-90.314-2-35 MG CAPS   Oral   Take 1 capsule by mouth daily.         . metFORMIN (GLUCOPHAGE) 500 MG tablet   Oral   Take 250 mg by mouth 2 (two) times daily with a meal.          . oxyCODONE (OXY IR/ROXICODONE) 5 MG immediate release tablet   Oral   Take 5-10 mg by mouth every 6 (six) hours as needed for pain.         . ropinirole  (REQUIP) 5 MG tablet   Oral   Take 5 mg by mouth at bedtime.         . VOLTAREN 1 % GEL   Apply externally   Apply 2 g topically 2 (two) times daily as needed (for pain). On back          BP 163/71  Temp(Src) 97.9 F (36.6 C) (Oral)  Resp 24  SpO2 100% Physical Exam  Constitutional: She is oriented to person, place, and time. She appears well-developed and well-nourished.  HENT:  Head: Normocephalic.  Neck: Normal range of motion. Neck supple.  Cardiovascular: Normal rate, regular rhythm and intact distal pulses.   Pulmonary/Chest: Effort normal and breath sounds normal.  Abdominal: Soft. Bowel sounds are normal. There is no tenderness. There is no rebound and no guarding.  Musculoskeletal: Normal range of motion.  Moderate swelling and redness to left distal lower leg and ankle, extending to mid-forefoot. Mild warmth to touch. No lesion, sore or ulceration. Minimally tender.  Neurological: She is alert and oriented to person, place, and time.  Skin: Skin is warm and dry. No rash noted.  Psychiatric: She has a normal mood and affect.    ED Course  Procedures (including critical care time) Labs Review Labs Reviewed - No data to display Imaging Review No results found.  MDM  No diagnosis found. 1. Early cellulitis  Venous duplex of left lower extremity negative for DVT. Suspect, in the absence of clot, there is early infection present and will cover with Keflex. Instructions to return for worsening symptoms given. Dr. Reather Converse has co-evaluated patient and agrees with plan.    Dewaine Oats, PA-C 08/14/13 1759  Medical screening examination/treatment/procedure(s) were conducted as a shared visit with non-physician practitioner(s) or resident  and myself.  I personally evaluated the patient during the encounter and agree with the findings and plan unless otherwise indicated.    Well appearing.  Pt denies CP and sob.  Multiple DVT hx.  Mild LE swelling, mild warmth and  mild erythema on left LE at mid tibia and below.  NV intact.  [place] neg for DVT.  Concern for celluliits. PO abx and close fup outpt. DC  Mariea Clonts, MD 08/15/13 1246

## 2013-08-14 NOTE — Progress Notes (Signed)
VASCULAR LAB PRELIMINARY  PRELIMINARY  PRELIMINARY  PRELIMINARY  Left lower extremity venous Doppler completed.    Preliminary report:  There is no acute DVT noted in the left lower extremity.  Can not rule out chronic DVT in the femoral vein.    Kassiah Mccrory, RVT 08/14/2013, 5:09 PM

## 2013-08-14 NOTE — ED Notes (Signed)
Per EMS: About an hour ago, pt was sitting in chair, left leg became swollen and painful, pt unable to stand on it. Pt had blood clot last March, states that this is presenting the same way. Pt was taken off of blood thinner medication. BP 160/70.

## 2013-08-19 DIAGNOSIS — E119 Type 2 diabetes mellitus without complications: Secondary | ICD-10-CM | POA: Diagnosis not present

## 2013-08-19 DIAGNOSIS — E538 Deficiency of other specified B group vitamins: Secondary | ICD-10-CM | POA: Diagnosis not present

## 2013-08-19 DIAGNOSIS — R5381 Other malaise: Secondary | ICD-10-CM | POA: Diagnosis not present

## 2013-08-19 DIAGNOSIS — I10 Essential (primary) hypertension: Secondary | ICD-10-CM | POA: Diagnosis not present

## 2013-08-19 DIAGNOSIS — R609 Edema, unspecified: Secondary | ICD-10-CM | POA: Diagnosis not present

## 2013-08-19 DIAGNOSIS — I2699 Other pulmonary embolism without acute cor pulmonale: Secondary | ICD-10-CM | POA: Diagnosis not present

## 2013-08-19 DIAGNOSIS — G609 Hereditary and idiopathic neuropathy, unspecified: Secondary | ICD-10-CM | POA: Diagnosis not present

## 2013-08-19 DIAGNOSIS — I824Z9 Acute embolism and thrombosis of unspecified deep veins of unspecified distal lower extremity: Secondary | ICD-10-CM | POA: Diagnosis not present

## 2013-09-05 DIAGNOSIS — I2699 Other pulmonary embolism without acute cor pulmonale: Secondary | ICD-10-CM | POA: Diagnosis not present

## 2013-09-05 DIAGNOSIS — F3289 Other specified depressive episodes: Secondary | ICD-10-CM | POA: Diagnosis not present

## 2013-09-05 DIAGNOSIS — I824Z9 Acute embolism and thrombosis of unspecified deep veins of unspecified distal lower extremity: Secondary | ICD-10-CM | POA: Diagnosis not present

## 2013-09-05 DIAGNOSIS — R7301 Impaired fasting glucose: Secondary | ICD-10-CM | POA: Diagnosis not present

## 2013-09-05 DIAGNOSIS — I10 Essential (primary) hypertension: Secondary | ICD-10-CM | POA: Diagnosis not present

## 2013-09-05 DIAGNOSIS — G894 Chronic pain syndrome: Secondary | ICD-10-CM | POA: Diagnosis not present

## 2013-09-05 DIAGNOSIS — G609 Hereditary and idiopathic neuropathy, unspecified: Secondary | ICD-10-CM | POA: Diagnosis not present

## 2013-09-05 DIAGNOSIS — E1159 Type 2 diabetes mellitus with other circulatory complications: Secondary | ICD-10-CM | POA: Diagnosis not present

## 2013-09-05 DIAGNOSIS — E119 Type 2 diabetes mellitus without complications: Secondary | ICD-10-CM | POA: Diagnosis not present

## 2013-09-05 DIAGNOSIS — N390 Urinary tract infection, site not specified: Secondary | ICD-10-CM | POA: Diagnosis not present

## 2013-09-05 DIAGNOSIS — F329 Major depressive disorder, single episode, unspecified: Secondary | ICD-10-CM | POA: Diagnosis not present

## 2013-09-05 DIAGNOSIS — G2589 Other specified extrapyramidal and movement disorders: Secondary | ICD-10-CM | POA: Diagnosis not present

## 2013-09-05 DIAGNOSIS — E538 Deficiency of other specified B group vitamins: Secondary | ICD-10-CM | POA: Diagnosis not present

## 2013-10-10 DIAGNOSIS — M545 Low back pain, unspecified: Secondary | ICD-10-CM | POA: Diagnosis not present

## 2013-10-10 DIAGNOSIS — M542 Cervicalgia: Secondary | ICD-10-CM | POA: Diagnosis not present

## 2013-10-12 DIAGNOSIS — I824Z9 Acute embolism and thrombosis of unspecified deep veins of unspecified distal lower extremity: Secondary | ICD-10-CM | POA: Diagnosis not present

## 2013-10-12 DIAGNOSIS — E538 Deficiency of other specified B group vitamins: Secondary | ICD-10-CM | POA: Diagnosis not present

## 2013-10-12 DIAGNOSIS — F3289 Other specified depressive episodes: Secondary | ICD-10-CM | POA: Diagnosis not present

## 2013-10-12 DIAGNOSIS — N19 Unspecified kidney failure: Secondary | ICD-10-CM | POA: Diagnosis not present

## 2013-10-12 DIAGNOSIS — I2699 Other pulmonary embolism without acute cor pulmonale: Secondary | ICD-10-CM | POA: Diagnosis not present

## 2013-10-12 DIAGNOSIS — I10 Essential (primary) hypertension: Secondary | ICD-10-CM | POA: Diagnosis not present

## 2013-10-12 DIAGNOSIS — Z23 Encounter for immunization: Secondary | ICD-10-CM | POA: Diagnosis not present

## 2013-10-12 DIAGNOSIS — E119 Type 2 diabetes mellitus without complications: Secondary | ICD-10-CM | POA: Diagnosis not present

## 2013-10-12 DIAGNOSIS — F329 Major depressive disorder, single episode, unspecified: Secondary | ICD-10-CM | POA: Diagnosis not present

## 2013-10-12 DIAGNOSIS — G609 Hereditary and idiopathic neuropathy, unspecified: Secondary | ICD-10-CM | POA: Diagnosis not present

## 2013-11-14 DIAGNOSIS — F3289 Other specified depressive episodes: Secondary | ICD-10-CM | POA: Diagnosis not present

## 2013-11-14 DIAGNOSIS — G609 Hereditary and idiopathic neuropathy, unspecified: Secondary | ICD-10-CM | POA: Diagnosis not present

## 2013-11-14 DIAGNOSIS — I2699 Other pulmonary embolism without acute cor pulmonale: Secondary | ICD-10-CM | POA: Diagnosis not present

## 2013-11-14 DIAGNOSIS — I10 Essential (primary) hypertension: Secondary | ICD-10-CM | POA: Diagnosis not present

## 2013-11-14 DIAGNOSIS — F329 Major depressive disorder, single episode, unspecified: Secondary | ICD-10-CM | POA: Diagnosis not present

## 2013-11-14 DIAGNOSIS — I824Z9 Acute embolism and thrombosis of unspecified deep veins of unspecified distal lower extremity: Secondary | ICD-10-CM | POA: Diagnosis not present

## 2013-11-14 DIAGNOSIS — N19 Unspecified kidney failure: Secondary | ICD-10-CM | POA: Diagnosis not present

## 2013-11-14 DIAGNOSIS — E119 Type 2 diabetes mellitus without complications: Secondary | ICD-10-CM | POA: Diagnosis not present

## 2013-11-14 DIAGNOSIS — E538 Deficiency of other specified B group vitamins: Secondary | ICD-10-CM | POA: Diagnosis not present

## 2013-12-24 ENCOUNTER — Emergency Department (HOSPITAL_COMMUNITY): Payer: Medicare Other

## 2013-12-24 ENCOUNTER — Encounter (HOSPITAL_COMMUNITY): Payer: Self-pay | Admitting: Emergency Medicine

## 2013-12-24 ENCOUNTER — Emergency Department (HOSPITAL_COMMUNITY)
Admission: EM | Admit: 2013-12-24 | Discharge: 2013-12-24 | Disposition: A | Discharge status: Home / Self Care | Payer: Medicare Other | Attending: Emergency Medicine | Admitting: Emergency Medicine

## 2013-12-24 DIAGNOSIS — K219 Gastro-esophageal reflux disease without esophagitis: Secondary | ICD-10-CM | POA: Diagnosis not present

## 2013-12-24 DIAGNOSIS — Z7901 Long term (current) use of anticoagulants: Secondary | ICD-10-CM | POA: Diagnosis not present

## 2013-12-24 DIAGNOSIS — Z8739 Personal history of other diseases of the musculoskeletal system and connective tissue: Secondary | ICD-10-CM | POA: Insufficient documentation

## 2013-12-24 DIAGNOSIS — E119 Type 2 diabetes mellitus without complications: Secondary | ICD-10-CM | POA: Insufficient documentation

## 2013-12-24 DIAGNOSIS — Z8742 Personal history of other diseases of the female genital tract: Secondary | ICD-10-CM | POA: Insufficient documentation

## 2013-12-24 DIAGNOSIS — R112 Nausea with vomiting, unspecified: Secondary | ICD-10-CM | POA: Insufficient documentation

## 2013-12-24 DIAGNOSIS — Z86718 Personal history of other venous thrombosis and embolism: Secondary | ICD-10-CM | POA: Diagnosis not present

## 2013-12-24 DIAGNOSIS — R1110 Vomiting, unspecified: Secondary | ICD-10-CM | POA: Diagnosis not present

## 2013-12-24 DIAGNOSIS — Z880 Allergy status to penicillin: Secondary | ICD-10-CM | POA: Diagnosis not present

## 2013-12-24 DIAGNOSIS — K2970 Gastritis, unspecified, without bleeding: Secondary | ICD-10-CM | POA: Diagnosis not present

## 2013-12-24 DIAGNOSIS — K2990 Gastroduodenitis, unspecified, without bleeding: Secondary | ICD-10-CM | POA: Diagnosis not present

## 2013-12-24 DIAGNOSIS — R110 Nausea: Secondary | ICD-10-CM | POA: Diagnosis not present

## 2013-12-24 DIAGNOSIS — I10 Essential (primary) hypertension: Secondary | ICD-10-CM | POA: Diagnosis not present

## 2013-12-24 DIAGNOSIS — R109 Unspecified abdominal pain: Secondary | ICD-10-CM | POA: Diagnosis not present

## 2013-12-24 DIAGNOSIS — Z79899 Other long term (current) drug therapy: Secondary | ICD-10-CM | POA: Diagnosis not present

## 2013-12-24 DIAGNOSIS — R5381 Other malaise: Secondary | ICD-10-CM | POA: Diagnosis not present

## 2013-12-24 DIAGNOSIS — Z8669 Personal history of other diseases of the nervous system and sense organs: Secondary | ICD-10-CM | POA: Diagnosis not present

## 2013-12-24 LAB — COMPREHENSIVE METABOLIC PANEL
ALT: 14 U/L (ref 0–35)
AST: 26 U/L (ref 0–37)
Albumin: 3.3 g/dL — ABNORMAL LOW (ref 3.5–5.2)
Alkaline Phosphatase: 81 U/L (ref 39–117)
BUN: 35 mg/dL — ABNORMAL HIGH (ref 6–23)
CO2: 27 mEq/L (ref 19–32)
Calcium: 9.1 mg/dL (ref 8.4–10.5)
Chloride: 102 mEq/L (ref 96–112)
Creatinine, Ser: 0.85 mg/dL (ref 0.50–1.10)
GFR calc Af Amer: 67 mL/min — ABNORMAL LOW (ref >90)
GFR calc non Af Amer: 58 mL/min — ABNORMAL LOW (ref >90)
Glucose, Bld: 101 mg/dL — ABNORMAL HIGH (ref 70–99)
Potassium: 4.4 mEq/L (ref 3.7–5.3)
Sodium: 139 mEq/L (ref 137–147)
Total Bilirubin: 0.2 mg/dL — ABNORMAL LOW (ref 0.3–1.2)
Total Protein: 6 g/dL (ref 6.0–8.3)

## 2013-12-24 LAB — URINALYSIS, ROUTINE W REFLEX MICROSCOPIC
Bilirubin Urine: NEGATIVE
Glucose, UA: NEGATIVE mg/dL
Hgb urine dipstick: NEGATIVE
Ketones, ur: NEGATIVE mg/dL
Leukocytes, UA: NEGATIVE
Nitrite: NEGATIVE
Protein, ur: NEGATIVE mg/dL
Specific Gravity, Urine: 1.019 (ref 1.005–1.030)
Urobilinogen, UA: 0.2 mg/dL (ref 0.0–1.0)
pH: 7.5 (ref 5.0–8.0)

## 2013-12-24 LAB — CG4 I-STAT (LACTIC ACID): Lactic Acid, Venous: 1.06 mmol/L (ref 0.5–2.2)

## 2013-12-24 LAB — CBC WITH DIFFERENTIAL/PLATELET
Basophils Absolute: 0 10*3/uL (ref 0.0–0.1)
Basophils Relative: 0 % (ref 0–1)
Eosinophils Absolute: 0.1 10*3/uL (ref 0.0–0.7)
Eosinophils Relative: 2 % (ref 0–5)
HCT: 31 % — ABNORMAL LOW (ref 36.0–46.0)
Hemoglobin: 10 g/dL — ABNORMAL LOW (ref 12.0–15.0)
Lymphocytes Relative: 30 % (ref 12–46)
Lymphs Abs: 1.5 10*3/uL (ref 0.7–4.0)
MCH: 29.1 pg (ref 26.0–34.0)
MCHC: 32.3 g/dL (ref 30.0–36.0)
MCV: 90.1 fL (ref 78.0–100.0)
Monocytes Absolute: 0.3 10*3/uL (ref 0.1–1.0)
Monocytes Relative: 5 % (ref 3–12)
Neutro Abs: 3.1 10*3/uL (ref 1.7–7.7)
Neutrophils Relative %: 62 % (ref 43–77)
Platelets: 195 10*3/uL (ref 150–400)
RBC: 3.44 MIL/uL — ABNORMAL LOW (ref 3.87–5.11)
RDW: 13.7 % (ref 11.5–15.5)
WBC: 4.9 10*3/uL (ref 4.0–10.5)

## 2013-12-24 LAB — LIPASE, BLOOD: Lipase: 40 U/L (ref 11–59)

## 2013-12-24 MED ORDER — OXYCODONE-ACETAMINOPHEN 5-325 MG PO TABS
1.0000 | ORAL_TABLET | Freq: Once | ORAL | Status: AC
  Administered 2013-12-24: 1 via ORAL
  Filled 2013-12-24: qty 1

## 2013-12-24 MED ORDER — IOHEXOL 300 MG/ML  SOLN
50.0000 mL | Freq: Once | INTRAMUSCULAR | Status: AC | PRN
  Administered 2013-12-24: 50 mL via ORAL

## 2013-12-24 MED ORDER — ONDANSETRON 8 MG PO TBDP: ORAL_TABLET | ORAL | Status: DC

## 2013-12-24 MED ORDER — SODIUM CHLORIDE 0.9 % IV BOLUS (SEPSIS)
500.0000 mL | INTRAVENOUS | Status: AC
  Administered 2013-12-24: 500 mL via INTRAVENOUS

## 2013-12-24 MED ORDER — IOHEXOL 300 MG/ML  SOLN
80.0000 mL | Freq: Once | INTRAMUSCULAR | Status: AC | PRN
  Administered 2013-12-24: 80 mL via INTRAVENOUS

## 2013-12-24 NOTE — Discharge Instructions (Signed)
1. Medications: zofran for nausea, usual home medications 2. Treatment: rest, drink plenty of fluids,  3. Follow Up: Please followup with your primary doctor for discussion of your diagnoses and further evaluation after today's visit;     Nausea and Vomiting Nausea is a sick feeling that often comes before throwing up (vomiting). Vomiting is a reflex where stomach contents come out of your mouth. Vomiting can cause severe loss of body fluids (dehydration). Children and elderly adults can become dehydrated quickly, especially if they also have diarrhea. Nausea and vomiting are symptoms of a condition or disease. It is important to find the cause of your symptoms. CAUSES   Direct irritation of the stomach lining. This irritation can result from increased acid production (gastroesophageal reflux disease), infection, food poisoning, taking certain medicines (such as nonsteroidal anti-inflammatory drugs), alcohol use, or tobacco use.  Signals from the brain.These signals could be caused by a headache, heat exposure, an inner ear disturbance, increased pressure in the brain from injury, infection, a tumor, or a concussion, pain, emotional stimulus, or metabolic problems.  An obstruction in the gastrointestinal tract (bowel obstruction).  Illnesses such as diabetes, hepatitis, gallbladder problems, appendicitis, kidney problems, cancer, sepsis, atypical symptoms of a heart attack, or eating disorders.  Medical treatments such as chemotherapy and radiation.  Receiving medicine that makes you sleep (general anesthetic) during surgery. DIAGNOSIS Your caregiver may ask for tests to be done if the problems do not improve after a few days. Tests may also be done if symptoms are severe or if the reason for the nausea and vomiting is not clear. Tests may include:  Urine tests.  Blood tests.  Stool tests.  Cultures (to look for evidence of infection).  X-rays or other imaging studies. Test results  can help your caregiver make decisions about treatment or the need for additional tests. TREATMENT You need to stay well hydrated. Drink frequently but in small amounts.You may wish to drink water, sports drinks, clear broth, or eat frozen ice pops or gelatin dessert to help stay hydrated.When you eat, eating slowly may help prevent nausea.There are also some antinausea medicines that may help prevent nausea. HOME CARE INSTRUCTIONS   Take all medicine as directed by your caregiver.  If you do not have an appetite, do not force yourself to eat. However, you must continue to drink fluids.  If you have an appetite, eat a normal diet unless your caregiver tells you differently.  Eat a variety of complex carbohydrates (rice, wheat, potatoes, bread), lean meats, yogurt, fruits, and vegetables.  Avoid high-fat foods because they are more difficult to digest.  Drink enough water and fluids to keep your urine clear or pale yellow.  If you are dehydrated, ask your caregiver for specific rehydration instructions. Signs of dehydration may include:  Severe thirst.  Dry lips and mouth.  Dizziness.  Dark urine.  Decreasing urine frequency and amount.  Confusion.  Rapid breathing or pulse. SEEK IMMEDIATE MEDICAL CARE IF:   You have blood or brown flecks (like coffee grounds) in your vomit.  You have black or bloody stools.  You have a severe headache or stiff neck.  You are confused.  You have severe abdominal pain.  You have chest pain or trouble breathing.  You do not urinate at least once every 8 hours.  You develop cold or clammy skin.  You continue to vomit for longer than 24 to 48 hours.  You have a fever. MAKE SURE YOU:   Understand these instructions.  Will watch your condition.  Will get help right away if you are not doing well or get worse. Document Released: 11/24/2005 Document Revised: 02/16/2012 Document Reviewed: 04/23/2011 [facility] Patient Information  2014 [place], [place].

## 2013-12-24 NOTE — ED Notes (Addendum)
Per EMS, pt has had nausea, 7/10 abdominal pain and vomiting since 4AM. Pt states her vomit was brown and foul smelling. Pt states she has not vomited since 4AM. Pt states her last normal bowel movement was 8PM last night. Pt states she has felt weaker since last Sunday.

## 2013-12-24 NOTE — ED Notes (Signed)
Bed: [id number] Expected date: 12/24/13 Expected time: 11:33 AM Means of arrival: Ambulance Comments: Weakness, N/V

## 2013-12-24 NOTE — ED Provider Notes (Signed)
Care assumed from [facility], PA-C  [name] is a 80 y.o. female presents c/o several episodes of emesis around 4AM.  No emesis here in the department, but persistent generalized abd pain.    CT with mild thickening of the gallbladder wall and without evidence of diverticulitis.  Will obtain RUQ [place] to r/o cholecystitis.  Pt is afebrile with stable vital signs.  CBC without leukocytosis and CMP without evidence of AKI.   Plan:  Will give fluids and follow abd [place].    Face to face Exam:   General: Awake  HEENT: Atraumatic  Cardiac: Regular rate and rhythm Resp: Normal effort  Abd: Nondistended, and nontender Neuro:No focal weakness  Lymph: No adenopathy  8:24 PM Patient tolerating by mouth here in the department without difficulty. Her abdomen remained soft and nontender after repeat exam.  Abdominal ultrasound with out acute findings including gallstones gallbladder wall thickening or evidence of cholecystitis.  Patient is alert, oriented, nontoxic nonseptic appearing. She has had no further emesis and is tolerating by mouth.  Afebrile and not tachycardic.  It has been determined that no acute conditions requiring further emergency intervention are present at this time. The patient/guardian have been advised of the diagnosis and plan. We have discussed signs and symptoms that warrant return to the ED, such as changes or worsening in symptoms.   Vital signs are stable at discharge.   BP 137/62  Pulse 55  Temp(Src) 98.5 F (36.9 C) (Oral)  Resp 14  SpO2 100%  Patient/guardian has voiced understanding and agreed to follow-up with the PCP or specialist.        Abigail Butts, PA-C 12/24/13 2026

## 2013-12-24 NOTE — ED Provider Notes (Signed)
CSN: [id number]     Arrival date & time 12/24/13  1143 History   First MD Initiated Contact with Patient 12/24/13 1158     Chief Complaint  Patient presents with  . Nausea  . Emesis   (Consider location/radiation/quality/duration/timing/severity/associated sxs/prior Treatment) HPI Patient presents emergency department with several episodes of vomiting, that occurred around 4 AM the patient, states, after this episode.  She did not have any further vomiting.  She states that yesterday, she did feel like she was ill, but cannot define it any further for me.  Patient states, that she did not take any medications prior to arrival.  The patient denies chest pain, shortness of breath, weakness, dizziness, headache, blurred vision, fever, dysuria, rash, back pain, diarrhea, or syncope.  The patient, states, that she also had some abdominal pain, that has been diffuse. Past Medical History  Diagnosis Date  . Cyst and pseudocyst of pancreas   . Unspecified essential hypertension   . Other and unspecified hyperlipidemia   . Polymyalgia rheumatica   . Personal history of unspecified digestive disease   . Restless legs syndrome (RLS)   . Insomnia, unspecified   . Other malaise and fatigue   . Diverticulosis of colon (without mention of hemorrhage)   . Acute gastritis without mention of hemorrhage   . Duodenitis without mention of hemorrhage   . Diaphragmatic hernia without mention of obstruction or gangrene   . Esophageal reflux   . Stricture and stenosis of esophagus   . OAB (overactive bladder)   . Left leg DVT jan 2013  . Unspecified sleep apnea     hx of sleep apnea, none since weight loss  . Diabetes mellitus    Past Surgical History  Procedure Laterality Date  . Oophorectomy  1944    not sure which ovary  . Rotator cuff repair  right     4-5 yrs ago  . Eye surgery      cataracts removed-bilateral eyes  . Back surgery  2009    lower back  . Colon surgery  June 17, 2012    surgery  for bleed  . Shoulder open rotator cuff repair  08/04/2012    Procedure: ROTATOR CUFF REPAIR SHOULDER OPEN;  Surgeon: Tobi Bastos, MD;  Location: [facility];  Service: Orthopedics;  Laterality: Left;  with Anchors and Graft   Family History  Problem Relation Age of Onset  . Melanoma Daughter     died age 94  . Melanoma Son    History  Substance Use Topics  . Smoking status: Never Smoker   . Smokeless tobacco: Never Used  . Alcohol Use: No   OB History   Grav Para Term Preterm Abortions TAB SAB Ect Mult Living                 Review of Systems All other systems negative except as documented in the HPI. All pertinent positives and negatives as reviewed in the HPI.  Allergies  Sulfa antibiotics and Penicillins  Home Medications   Current Outpatient Rx  Name  Route  Sig  Dispense  Refill  . amLODipine (NORVASC) 2.5 MG tablet   Oral   Take 2.5 mg by mouth daily. Stop Cozaar         . cyanocobalamin (,VITAMIN B-12,) 1000 MCG/ML injection   Intramuscular   Inject 1,000 mcg into the muscle every 30 (thirty) days.         Marland Kitchen L-Methylfolate-Algae-B12-B6 (METANX) 3-90.314-2-35 MG CAPS  Oral   Take 1 capsule by mouth daily.         . metFORMIN (GLUCOPHAGE) 500 MG tablet   Oral   Take 500 mg by mouth 2 (two) times daily with a meal.          . mirtazapine (REMERON) 7.5 MG tablet   Oral   Take 7.5 mg by mouth at bedtime.         Marland Kitchen oxyCODONE (OXY IR/ROXICODONE) 5 MG immediate release tablet   Oral   Take 5-10 mg by mouth every 6 (six) hours as needed for pain.         . pantoprazole (PROTONIX) 40 MG tablet   Oral   Take 40 mg by mouth daily.         . rivaroxaban (XARELTO) 10 MG TABS tablet   Oral   Take 10 mg by mouth daily.         . ropinirole (REQUIP) 5 MG tablet   Oral   Take 5 mg by mouth at bedtime.         . VOLTAREN 1 % GEL   Apply externally   Apply 2 g topically 2 (two) times daily as needed (for pain). On back          BP 153/73   Pulse 70  Temp(Src) 97.5 F (36.4 C) (Oral)  Resp 16  SpO2 100% Physical Exam  Nursing note and vitals reviewed. Constitutional: She is oriented to person, place, and time. She appears well-developed and well-nourished. No distress.  HENT:  Head: Normocephalic and atraumatic.  Mouth/Throat: Oropharynx is clear and moist.  Eyes: Pupils are equal, round, and reactive to light.  Neck: Normal range of motion. Neck supple.  Cardiovascular: Normal rate, regular rhythm and normal heart sounds.  Exam reveals no gallop and no friction rub.   No murmur heard. Pulmonary/Chest: Effort normal and breath sounds normal. No respiratory distress.  Abdominal: Soft. Bowel sounds are normal. She exhibits no distension. There is tenderness. There is no guarding.  Neurological: She is alert and oriented to person, place, and time. She exhibits normal muscle tone. Coordination normal.  Skin: Skin is warm and dry.    ED Course  Procedures (including critical care time) Labs Review Labs Reviewed  CBC WITH DIFFERENTIAL - Abnormal; Notable for the following:    RBC 3.44 (*)    Hemoglobin 10.0 (*)    HCT 31.0 (*)    All other components within normal limits  COMPREHENSIVE METABOLIC PANEL - Abnormal; Notable for the following:    Glucose, Bld 101 (*)    BUN 35 (*)    Albumin 3.3 (*)    Total Bilirubin 0.2 (*)    GFR calc non Af Amer 58 (*)    GFR calc Af Amer 67 (*)    All other components within normal limits  URINALYSIS, ROUTINE W REFLEX MICROSCOPIC  LIPASE, BLOOD  CG4 I-STAT (LACTIC ACID)   Imaging Review Ct Abdomen Pelvis W Contrast  12/24/2013   CLINICAL DATA:  Abdominal pain and vomiting  EXAM: CT ABDOMEN AND PELVIS WITH CONTRAST  TECHNIQUE: Multidetector CT imaging of the abdomen and pelvis was performed using the standard protocol following bolus administration of intravenous contrast.  CONTRAST:  75mL OMNIPAQUE IOHEXOL 300 MG/ML SOLN, 39mL OMNIPAQUE IOHEXOL 300 MG/ML SOLN intravenously. The  patient also received oral contrast material  COMPARISON:  CT scan of the abdomen and pelvis dated January 06, 2010  FINDINGS: There is a paucity of subcutaneous  and intra-abdominal fat. The liver exhibits no focal mass. There is mild intrahepatic ductal dilation which is more conspicuous than in the past. The gallbladder is adequately distended. There may be mild gallbladder wall thickening. No stones are evident. The pancreas is atrophic and exhibits fatty infiltration. The spleen is not enlarged. There is a large hiatal hernia-partially intrathoracic stomach which is not a new finding. There are no adrenal masses. There are extrarenal pelves bilaterally. There are no findings to suggest hydronephrosis. No obstructing stones are demonstrated. There may be collecting system stones bilaterally but contrast has been administered which may be mimicking stones. The psoas musculature appears normal. The caliber of the abdominal aorta exhibits mild failure to taper but there is no evidence of an aneurysm.  The orally administered contrast has traversed much of the small bowel but has not reached the colon. There is no evidence of a small bowel obstruction. The stool and gas pattern within the colon is within the limits of normal. There are exuberant sigmoid diverticula, but there is no objective evidence of acute diverticulitis. The urinary bladder and uterus and adnexal structures exhibit no acute abnormalities. There is likely a fundal uterine fibroid.  The lumbar vertebral bodies are preserved in height. The patient has undergone posterior lumbar fusion across the L4-5 and L5-S1 levels. There is degenerative disc change at the L3-4 level with grade 1 anterolisthesis of L3 with respect to L4. This appears to be secondary to degenerative disc and facet joint change here. The bony pelvis exhibits no acute abnormality. The hip joint spaces are reasonably well maintained for age.  The lung bases exhibit emphysematous  changes. There is no evidence of pneumonia or pulmonary nodules.  IMPRESSION: 1. There is subjective mild thickening of the gallbladder wall. No stones are evident and there is no pericholecystic fluid. A limited right upper quadrant ultrasound would be useful to exclude acute cholecystitis. 2. There is slightly increased prominence of the mild intrahepatic ductal dilation that is of but of uncertain significance but may be related to #1 above. There is atrophy of the pancreas. 3. There is no evidence of pyelonephritis nor of obstruction of either kidney. I cannot exclude small stones but contrast present in the calices may be confounding this finding. 4. There is sigmoid diverticulosis without objective evidence of acute diverticulitis. There is no evidence of a small or large bowel obstruction. The appendix is not discretely identified. 5. There are degenerative changes and post fusion changes of the lumbar spine. There are emphysematous changes at the lung bases.   Electronically Signed   By: David  Martinique   On: 12/24/2013 14:27   will order an ultrasound to evaluate her gallbladder further.  Patient only had a sort episode of vomiting, and has not vomited since that time.  The patient will be monitored further and given an oral trial of fluids.  Oncoming PA will monitor the patient further for me and complete the discharge.    Brent General, PA-C 12/25/13 [phone]

## 2013-12-25 NOTE — ED Provider Notes (Signed)
Medical screening examination/treatment/procedure(s) were performed by non-physician practitioner and as supervising physician I was immediately available for consultation/collaboration.  EKG Interpretation   None         Ephraim Hamburger, MD 12/25/13 [phone]

## 2013-12-25 NOTE — ED Provider Notes (Signed)
Medical screening examination/treatment/procedure(s) were conducted as a shared visit with non-physician practitioner(s) and myself.  I personally evaluated the patient during the encounter.  EKG Interpretation   None       Pt examined him. Benign abdomen. Ultrasound pending known elevation of white blood cell count her hepatobiliary enzymes. Her symptoms are improved subjectively and has an appetite now. Her ultrasound is unrevealing and she continues to feel well may be possible for discharge. Disposition will be as per the PA upon completion of imaging  Tanna Furry, MD 12/25/13 [phone]

## 2014-01-06 DIAGNOSIS — E538 Deficiency of other specified B group vitamins: Secondary | ICD-10-CM | POA: Diagnosis not present

## 2014-01-10 DIAGNOSIS — M545 Low back pain, unspecified: Secondary | ICD-10-CM | POA: Diagnosis not present

## 2014-01-10 DIAGNOSIS — M542 Cervicalgia: Secondary | ICD-10-CM | POA: Diagnosis not present

## 2014-01-11 ENCOUNTER — Ambulatory Visit: Payer: Medicare Other | Attending: Internal Medicine

## 2014-01-11 DIAGNOSIS — I10 Essential (primary) hypertension: Secondary | ICD-10-CM | POA: Insufficient documentation

## 2014-01-11 DIAGNOSIS — IMO0001 Reserved for inherently not codable concepts without codable children: Secondary | ICD-10-CM | POA: Diagnosis not present

## 2014-01-11 DIAGNOSIS — E119 Type 2 diabetes mellitus without complications: Secondary | ICD-10-CM | POA: Insufficient documentation

## 2014-01-11 DIAGNOSIS — R269 Unspecified abnormalities of gait and mobility: Secondary | ICD-10-CM | POA: Insufficient documentation

## 2014-01-11 DIAGNOSIS — M6281 Muscle weakness (generalized): Secondary | ICD-10-CM | POA: Insufficient documentation

## 2014-01-11 DIAGNOSIS — R262 Difficulty in walking, not elsewhere classified: Secondary | ICD-10-CM | POA: Diagnosis not present

## 2014-01-17 ENCOUNTER — Ambulatory Visit: Payer: Medicare Other

## 2014-01-18 ENCOUNTER — Ambulatory Visit: Payer: Medicare Other

## 2014-01-18 DIAGNOSIS — I10 Essential (primary) hypertension: Secondary | ICD-10-CM | POA: Diagnosis not present

## 2014-01-18 DIAGNOSIS — R269 Unspecified abnormalities of gait and mobility: Secondary | ICD-10-CM | POA: Diagnosis not present

## 2014-01-18 DIAGNOSIS — IMO0001 Reserved for inherently not codable concepts without codable children: Secondary | ICD-10-CM | POA: Diagnosis not present

## 2014-01-18 DIAGNOSIS — M6281 Muscle weakness (generalized): Secondary | ICD-10-CM | POA: Diagnosis not present

## 2014-01-18 DIAGNOSIS — R262 Difficulty in walking, not elsewhere classified: Secondary | ICD-10-CM | POA: Diagnosis not present

## 2014-01-18 DIAGNOSIS — E119 Type 2 diabetes mellitus without complications: Secondary | ICD-10-CM | POA: Diagnosis not present

## 2014-01-23 ENCOUNTER — Ambulatory Visit: Payer: Medicare Other | Admitting: Physical Therapy

## 2014-01-30 ENCOUNTER — Ambulatory Visit: Payer: Medicare Other | Admitting: Physical Therapy

## 2014-01-30 DIAGNOSIS — R269 Unspecified abnormalities of gait and mobility: Secondary | ICD-10-CM | POA: Diagnosis not present

## 2014-01-30 DIAGNOSIS — I10 Essential (primary) hypertension: Secondary | ICD-10-CM | POA: Diagnosis not present

## 2014-01-30 DIAGNOSIS — M6281 Muscle weakness (generalized): Secondary | ICD-10-CM | POA: Diagnosis not present

## 2014-01-30 DIAGNOSIS — IMO0001 Reserved for inherently not codable concepts without codable children: Secondary | ICD-10-CM | POA: Diagnosis not present

## 2014-01-30 DIAGNOSIS — E119 Type 2 diabetes mellitus without complications: Secondary | ICD-10-CM | POA: Diagnosis not present

## 2014-01-30 DIAGNOSIS — R262 Difficulty in walking, not elsewhere classified: Secondary | ICD-10-CM | POA: Diagnosis not present

## 2014-02-01 ENCOUNTER — Ambulatory Visit: Payer: Medicare Other

## 2014-02-06 ENCOUNTER — Ambulatory Visit: Payer: Medicare Other | Attending: Internal Medicine | Admitting: Physical Therapy

## 2014-02-06 DIAGNOSIS — IMO0001 Reserved for inherently not codable concepts without codable children: Secondary | ICD-10-CM | POA: Insufficient documentation

## 2014-02-06 DIAGNOSIS — E119 Type 2 diabetes mellitus without complications: Secondary | ICD-10-CM | POA: Insufficient documentation

## 2014-02-06 DIAGNOSIS — M6281 Muscle weakness (generalized): Secondary | ICD-10-CM | POA: Diagnosis not present

## 2014-02-06 DIAGNOSIS — R262 Difficulty in walking, not elsewhere classified: Secondary | ICD-10-CM | POA: Insufficient documentation

## 2014-02-06 DIAGNOSIS — R269 Unspecified abnormalities of gait and mobility: Secondary | ICD-10-CM | POA: Insufficient documentation

## 2014-02-06 DIAGNOSIS — I10 Essential (primary) hypertension: Secondary | ICD-10-CM | POA: Insufficient documentation

## 2014-02-08 ENCOUNTER — Ambulatory Visit: Payer: Medicare Other

## 2014-02-08 DIAGNOSIS — IMO0001 Reserved for inherently not codable concepts without codable children: Secondary | ICD-10-CM | POA: Diagnosis not present

## 2014-02-08 DIAGNOSIS — M6281 Muscle weakness (generalized): Secondary | ICD-10-CM | POA: Diagnosis not present

## 2014-02-08 DIAGNOSIS — R269 Unspecified abnormalities of gait and mobility: Secondary | ICD-10-CM | POA: Diagnosis not present

## 2014-02-08 DIAGNOSIS — I10 Essential (primary) hypertension: Secondary | ICD-10-CM | POA: Diagnosis not present

## 2014-02-08 DIAGNOSIS — R262 Difficulty in walking, not elsewhere classified: Secondary | ICD-10-CM | POA: Diagnosis not present

## 2014-02-08 DIAGNOSIS — E119 Type 2 diabetes mellitus without complications: Secondary | ICD-10-CM | POA: Diagnosis not present

## 2014-02-13 ENCOUNTER — Ambulatory Visit: Payer: Medicare Other | Admitting: Physical Therapy

## 2014-02-13 DIAGNOSIS — R262 Difficulty in walking, not elsewhere classified: Secondary | ICD-10-CM | POA: Diagnosis not present

## 2014-02-13 DIAGNOSIS — E538 Deficiency of other specified B group vitamins: Secondary | ICD-10-CM | POA: Diagnosis not present

## 2014-02-13 DIAGNOSIS — I824Z9 Acute embolism and thrombosis of unspecified deep veins of unspecified distal lower extremity: Secondary | ICD-10-CM | POA: Diagnosis not present

## 2014-02-13 DIAGNOSIS — N19 Unspecified kidney failure: Secondary | ICD-10-CM | POA: Diagnosis not present

## 2014-02-13 DIAGNOSIS — I10 Essential (primary) hypertension: Secondary | ICD-10-CM | POA: Diagnosis not present

## 2014-02-13 DIAGNOSIS — IMO0001 Reserved for inherently not codable concepts without codable children: Secondary | ICD-10-CM | POA: Diagnosis not present

## 2014-02-13 DIAGNOSIS — F329 Major depressive disorder, single episode, unspecified: Secondary | ICD-10-CM | POA: Diagnosis not present

## 2014-02-13 DIAGNOSIS — E119 Type 2 diabetes mellitus without complications: Secondary | ICD-10-CM | POA: Diagnosis not present

## 2014-02-13 DIAGNOSIS — M6281 Muscle weakness (generalized): Secondary | ICD-10-CM | POA: Diagnosis not present

## 2014-02-13 DIAGNOSIS — F3289 Other specified depressive episodes: Secondary | ICD-10-CM | POA: Diagnosis not present

## 2014-02-13 DIAGNOSIS — R269 Unspecified abnormalities of gait and mobility: Secondary | ICD-10-CM | POA: Diagnosis not present

## 2014-02-13 DIAGNOSIS — G609 Hereditary and idiopathic neuropathy, unspecified: Secondary | ICD-10-CM | POA: Diagnosis not present

## 2014-02-13 DIAGNOSIS — I2699 Other pulmonary embolism without acute cor pulmonale: Secondary | ICD-10-CM | POA: Diagnosis not present

## 2014-02-15 ENCOUNTER — Ambulatory Visit: Payer: Medicare Other

## 2014-02-15 DIAGNOSIS — M6281 Muscle weakness (generalized): Secondary | ICD-10-CM | POA: Diagnosis not present

## 2014-02-15 DIAGNOSIS — E119 Type 2 diabetes mellitus without complications: Secondary | ICD-10-CM | POA: Diagnosis not present

## 2014-02-15 DIAGNOSIS — R269 Unspecified abnormalities of gait and mobility: Secondary | ICD-10-CM | POA: Diagnosis not present

## 2014-02-15 DIAGNOSIS — IMO0001 Reserved for inherently not codable concepts without codable children: Secondary | ICD-10-CM | POA: Diagnosis not present

## 2014-02-15 DIAGNOSIS — R262 Difficulty in walking, not elsewhere classified: Secondary | ICD-10-CM | POA: Diagnosis not present

## 2014-02-15 DIAGNOSIS — I10 Essential (primary) hypertension: Secondary | ICD-10-CM | POA: Diagnosis not present

## 2014-02-20 ENCOUNTER — Ambulatory Visit: Payer: Medicare Other | Admitting: Physical Therapy

## 2014-02-20 DIAGNOSIS — IMO0001 Reserved for inherently not codable concepts without codable children: Secondary | ICD-10-CM | POA: Diagnosis not present

## 2014-02-20 DIAGNOSIS — E119 Type 2 diabetes mellitus without complications: Secondary | ICD-10-CM | POA: Diagnosis not present

## 2014-02-20 DIAGNOSIS — R269 Unspecified abnormalities of gait and mobility: Secondary | ICD-10-CM | POA: Diagnosis not present

## 2014-02-20 DIAGNOSIS — M6281 Muscle weakness (generalized): Secondary | ICD-10-CM | POA: Diagnosis not present

## 2014-02-20 DIAGNOSIS — R262 Difficulty in walking, not elsewhere classified: Secondary | ICD-10-CM | POA: Diagnosis not present

## 2014-02-20 DIAGNOSIS — I10 Essential (primary) hypertension: Secondary | ICD-10-CM | POA: Diagnosis not present

## 2014-02-22 ENCOUNTER — Ambulatory Visit: Payer: Medicare Other

## 2014-02-22 DIAGNOSIS — R262 Difficulty in walking, not elsewhere classified: Secondary | ICD-10-CM | POA: Diagnosis not present

## 2014-02-22 DIAGNOSIS — I10 Essential (primary) hypertension: Secondary | ICD-10-CM | POA: Diagnosis not present

## 2014-02-22 DIAGNOSIS — R269 Unspecified abnormalities of gait and mobility: Secondary | ICD-10-CM | POA: Diagnosis not present

## 2014-02-22 DIAGNOSIS — IMO0001 Reserved for inherently not codable concepts without codable children: Secondary | ICD-10-CM | POA: Diagnosis not present

## 2014-02-22 DIAGNOSIS — M6281 Muscle weakness (generalized): Secondary | ICD-10-CM | POA: Diagnosis not present

## 2014-02-22 DIAGNOSIS — E119 Type 2 diabetes mellitus without complications: Secondary | ICD-10-CM | POA: Diagnosis not present

## 2014-02-27 ENCOUNTER — Ambulatory Visit: Payer: Medicare Other

## 2014-02-28 DIAGNOSIS — I824Z9 Acute embolism and thrombosis of unspecified deep veins of unspecified distal lower extremity: Secondary | ICD-10-CM | POA: Diagnosis not present

## 2014-02-28 DIAGNOSIS — J40 Bronchitis, not specified as acute or chronic: Secondary | ICD-10-CM | POA: Diagnosis not present

## 2014-02-28 DIAGNOSIS — F3289 Other specified depressive episodes: Secondary | ICD-10-CM | POA: Diagnosis not present

## 2014-02-28 DIAGNOSIS — R059 Cough, unspecified: Secondary | ICD-10-CM | POA: Diagnosis not present

## 2014-02-28 DIAGNOSIS — G609 Hereditary and idiopathic neuropathy, unspecified: Secondary | ICD-10-CM | POA: Diagnosis not present

## 2014-02-28 DIAGNOSIS — F329 Major depressive disorder, single episode, unspecified: Secondary | ICD-10-CM | POA: Diagnosis not present

## 2014-02-28 DIAGNOSIS — N19 Unspecified kidney failure: Secondary | ICD-10-CM | POA: Diagnosis not present

## 2014-02-28 DIAGNOSIS — I10 Essential (primary) hypertension: Secondary | ICD-10-CM | POA: Diagnosis not present

## 2014-02-28 DIAGNOSIS — E538 Deficiency of other specified B group vitamins: Secondary | ICD-10-CM | POA: Diagnosis not present

## 2014-02-28 DIAGNOSIS — I2699 Other pulmonary embolism without acute cor pulmonale: Secondary | ICD-10-CM | POA: Diagnosis not present

## 2014-02-28 DIAGNOSIS — R05 Cough: Secondary | ICD-10-CM | POA: Diagnosis not present

## 2014-02-28 DIAGNOSIS — E119 Type 2 diabetes mellitus without complications: Secondary | ICD-10-CM | POA: Diagnosis not present

## 2014-03-01 ENCOUNTER — Ambulatory Visit: Payer: Medicare Other

## 2014-03-03 DIAGNOSIS — I129 Hypertensive chronic kidney disease with stage 1 through stage 4 chronic kidney disease, or unspecified chronic kidney disease: Secondary | ICD-10-CM | POA: Diagnosis not present

## 2014-03-03 DIAGNOSIS — R809 Proteinuria, unspecified: Secondary | ICD-10-CM | POA: Diagnosis not present

## 2014-03-03 DIAGNOSIS — N183 Chronic kidney disease, stage 3 unspecified: Secondary | ICD-10-CM | POA: Diagnosis not present

## 2014-03-06 ENCOUNTER — Ambulatory Visit: Payer: Medicare Other

## 2014-03-13 ENCOUNTER — Other Ambulatory Visit: Payer: Self-pay | Admitting: Internal Medicine

## 2014-03-13 DIAGNOSIS — R05 Cough: Secondary | ICD-10-CM | POA: Diagnosis not present

## 2014-03-13 DIAGNOSIS — N19 Unspecified kidney failure: Secondary | ICD-10-CM | POA: Diagnosis not present

## 2014-03-13 DIAGNOSIS — E538 Deficiency of other specified B group vitamins: Secondary | ICD-10-CM | POA: Diagnosis not present

## 2014-03-13 DIAGNOSIS — R059 Cough, unspecified: Secondary | ICD-10-CM | POA: Diagnosis not present

## 2014-03-13 DIAGNOSIS — F329 Major depressive disorder, single episode, unspecified: Secondary | ICD-10-CM | POA: Diagnosis not present

## 2014-03-13 DIAGNOSIS — R32 Unspecified urinary incontinence: Secondary | ICD-10-CM | POA: Diagnosis not present

## 2014-03-13 DIAGNOSIS — G609 Hereditary and idiopathic neuropathy, unspecified: Secondary | ICD-10-CM | POA: Diagnosis not present

## 2014-03-13 DIAGNOSIS — I824Z9 Acute embolism and thrombosis of unspecified deep veins of unspecified distal lower extremity: Secondary | ICD-10-CM | POA: Diagnosis not present

## 2014-03-13 DIAGNOSIS — E119 Type 2 diabetes mellitus without complications: Secondary | ICD-10-CM | POA: Diagnosis not present

## 2014-03-13 DIAGNOSIS — M79605 Pain in left leg: Secondary | ICD-10-CM

## 2014-03-13 DIAGNOSIS — M79609 Pain in unspecified limb: Secondary | ICD-10-CM | POA: Diagnosis not present

## 2014-03-13 DIAGNOSIS — I10 Essential (primary) hypertension: Secondary | ICD-10-CM | POA: Diagnosis not present

## 2014-03-13 DIAGNOSIS — I2699 Other pulmonary embolism without acute cor pulmonale: Secondary | ICD-10-CM | POA: Diagnosis not present

## 2014-03-13 DIAGNOSIS — R609 Edema, unspecified: Secondary | ICD-10-CM | POA: Diagnosis not present

## 2014-03-13 DIAGNOSIS — F3289 Other specified depressive episodes: Secondary | ICD-10-CM | POA: Diagnosis not present

## 2014-03-14 ENCOUNTER — Ambulatory Visit
Admission: RE | Admit: 2014-03-14 | Discharge: 2014-03-14 | Disposition: A | Discharge status: Home / Self Care | Payer: Medicare Other | Source: Ambulatory Visit | Attending: Internal Medicine | Admitting: Internal Medicine

## 2014-03-14 ENCOUNTER — Other Ambulatory Visit: Payer: Self-pay | Admitting: Internal Medicine

## 2014-03-14 DIAGNOSIS — I825Y9 Chronic embolism and thrombosis of unspecified deep veins of unspecified proximal lower extremity: Secondary | ICD-10-CM | POA: Diagnosis not present

## 2014-03-14 DIAGNOSIS — M79605 Pain in left leg: Secondary | ICD-10-CM

## 2014-03-14 DIAGNOSIS — J40 Bronchitis, not specified as acute or chronic: Secondary | ICD-10-CM

## 2014-03-17 ENCOUNTER — Ambulatory Visit
Admission: RE | Admit: 2014-03-17 | Discharge: 2014-03-17 | Disposition: A | Discharge status: Home / Self Care | Payer: Medicare Other | Source: Ambulatory Visit | Attending: Internal Medicine | Admitting: Internal Medicine

## 2014-03-17 DIAGNOSIS — J40 Bronchitis, not specified as acute or chronic: Secondary | ICD-10-CM

## 2014-03-17 DIAGNOSIS — J449 Chronic obstructive pulmonary disease, unspecified: Secondary | ICD-10-CM | POA: Diagnosis not present

## 2014-03-29 DIAGNOSIS — R059 Cough, unspecified: Secondary | ICD-10-CM | POA: Diagnosis not present

## 2014-03-29 DIAGNOSIS — R32 Unspecified urinary incontinence: Secondary | ICD-10-CM | POA: Diagnosis not present

## 2014-03-29 DIAGNOSIS — R05 Cough: Secondary | ICD-10-CM | POA: Diagnosis not present

## 2014-03-29 DIAGNOSIS — R300 Dysuria: Secondary | ICD-10-CM | POA: Diagnosis not present

## 2014-03-29 DIAGNOSIS — I10 Essential (primary) hypertension: Secondary | ICD-10-CM | POA: Diagnosis not present

## 2014-03-29 DIAGNOSIS — I824Z9 Acute embolism and thrombosis of unspecified deep veins of unspecified distal lower extremity: Secondary | ICD-10-CM | POA: Diagnosis not present

## 2014-03-29 DIAGNOSIS — E119 Type 2 diabetes mellitus without complications: Secondary | ICD-10-CM | POA: Diagnosis not present

## 2014-03-29 DIAGNOSIS — E538 Deficiency of other specified B group vitamins: Secondary | ICD-10-CM | POA: Diagnosis not present

## 2014-03-29 DIAGNOSIS — R609 Edema, unspecified: Secondary | ICD-10-CM | POA: Diagnosis not present

## 2014-04-03 ENCOUNTER — Inpatient Hospital Stay (HOSPITAL_COMMUNITY)
Admission: EM | Admit: 2014-04-03 | Discharge: 2014-04-06 | DRG: 193 | Disposition: A | Discharge status: Skilled Nursing Facility | Payer: Medicare Other | Attending: Internal Medicine | Admitting: Internal Medicine

## 2014-04-03 ENCOUNTER — Emergency Department (HOSPITAL_COMMUNITY): Payer: Medicare Other

## 2014-04-03 ENCOUNTER — Encounter (HOSPITAL_COMMUNITY): Payer: Self-pay | Admitting: Emergency Medicine

## 2014-04-03 DIAGNOSIS — Z8719 Personal history of other diseases of the digestive system: Secondary | ICD-10-CM

## 2014-04-03 DIAGNOSIS — I824Z9 Acute embolism and thrombosis of unspecified deep veins of unspecified distal lower extremity: Secondary | ICD-10-CM | POA: Diagnosis not present

## 2014-04-03 DIAGNOSIS — J9819 Other pulmonary collapse: Secondary | ICD-10-CM | POA: Diagnosis not present

## 2014-04-03 DIAGNOSIS — R0602 Shortness of breath: Secondary | ICD-10-CM | POA: Diagnosis not present

## 2014-04-03 DIAGNOSIS — G2581 Restless legs syndrome: Secondary | ICD-10-CM | POA: Diagnosis present

## 2014-04-03 DIAGNOSIS — Z808 Family history of malignant neoplasm of other organs or systems: Secondary | ICD-10-CM | POA: Diagnosis not present

## 2014-04-03 DIAGNOSIS — Z79899 Other long term (current) drug therapy: Secondary | ICD-10-CM | POA: Diagnosis not present

## 2014-04-03 DIAGNOSIS — N318 Other neuromuscular dysfunction of bladder: Secondary | ICD-10-CM | POA: Diagnosis present

## 2014-04-03 DIAGNOSIS — G4730 Sleep apnea, unspecified: Secondary | ICD-10-CM

## 2014-04-03 DIAGNOSIS — E039 Hypothyroidism, unspecified: Secondary | ICD-10-CM | POA: Diagnosis present

## 2014-04-03 DIAGNOSIS — M353 Polymyalgia rheumatica: Secondary | ICD-10-CM | POA: Diagnosis present

## 2014-04-03 DIAGNOSIS — Z880 Allergy status to penicillin: Secondary | ICD-10-CM

## 2014-04-03 DIAGNOSIS — Z881 Allergy status to other antibiotic agents status: Secondary | ICD-10-CM

## 2014-04-03 DIAGNOSIS — J9600 Acute respiratory failure, unspecified whether with hypoxia or hypercapnia: Secondary | ICD-10-CM | POA: Diagnosis present

## 2014-04-03 DIAGNOSIS — K222 Esophageal obstruction: Secondary | ICD-10-CM | POA: Diagnosis not present

## 2014-04-03 DIAGNOSIS — E440 Moderate protein-calorie malnutrition: Secondary | ICD-10-CM | POA: Diagnosis not present

## 2014-04-03 DIAGNOSIS — I82509 Chronic embolism and thrombosis of unspecified deep veins of unspecified lower extremity: Secondary | ICD-10-CM | POA: Diagnosis present

## 2014-04-03 DIAGNOSIS — E119 Type 2 diabetes mellitus without complications: Secondary | ICD-10-CM | POA: Diagnosis not present

## 2014-04-03 DIAGNOSIS — R269 Unspecified abnormalities of gait and mobility: Secondary | ICD-10-CM | POA: Diagnosis not present

## 2014-04-03 DIAGNOSIS — R05 Cough: Secondary | ICD-10-CM | POA: Diagnosis not present

## 2014-04-03 DIAGNOSIS — I82409 Acute embolism and thrombosis of unspecified deep veins of unspecified lower extremity: Secondary | ICD-10-CM

## 2014-04-03 DIAGNOSIS — N19 Unspecified kidney failure: Secondary | ICD-10-CM | POA: Diagnosis not present

## 2014-04-03 DIAGNOSIS — I10 Essential (primary) hypertension: Secondary | ICD-10-CM | POA: Diagnosis not present

## 2014-04-03 DIAGNOSIS — J189 Pneumonia, unspecified organism: Secondary | ICD-10-CM

## 2014-04-03 DIAGNOSIS — D638 Anemia in other chronic diseases classified elsewhere: Secondary | ICD-10-CM

## 2014-04-03 DIAGNOSIS — E785 Hyperlipidemia, unspecified: Secondary | ICD-10-CM | POA: Diagnosis present

## 2014-04-03 DIAGNOSIS — K219 Gastro-esophageal reflux disease without esophagitis: Secondary | ICD-10-CM | POA: Diagnosis present

## 2014-04-03 DIAGNOSIS — Z7901 Long term (current) use of anticoagulants: Secondary | ICD-10-CM

## 2014-04-03 DIAGNOSIS — G4700 Insomnia, unspecified: Secondary | ICD-10-CM | POA: Diagnosis not present

## 2014-04-03 DIAGNOSIS — R059 Cough, unspecified: Secondary | ICD-10-CM | POA: Diagnosis not present

## 2014-04-03 DIAGNOSIS — E860 Dehydration: Secondary | ICD-10-CM | POA: Diagnosis present

## 2014-04-03 DIAGNOSIS — M6281 Muscle weakness (generalized): Secondary | ICD-10-CM | POA: Diagnosis not present

## 2014-04-03 DIAGNOSIS — E538 Deficiency of other specified B group vitamins: Secondary | ICD-10-CM | POA: Diagnosis not present

## 2014-04-03 DIAGNOSIS — K5730 Diverticulosis of large intestine without perforation or abscess without bleeding: Secondary | ICD-10-CM

## 2014-04-03 DIAGNOSIS — R609 Edema, unspecified: Secondary | ICD-10-CM | POA: Diagnosis not present

## 2014-04-03 DIAGNOSIS — K2980 Duodenitis without bleeding: Secondary | ICD-10-CM

## 2014-04-03 DIAGNOSIS — Z681 Body mass index (BMI) 19 or less, adult: Secondary | ICD-10-CM | POA: Diagnosis not present

## 2014-04-03 DIAGNOSIS — R32 Unspecified urinary incontinence: Secondary | ICD-10-CM | POA: Diagnosis not present

## 2014-04-03 DIAGNOSIS — N3281 Overactive bladder: Secondary | ICD-10-CM | POA: Diagnosis present

## 2014-04-03 HISTORY — DX: Pneumonia, unspecified organism: J18.9

## 2014-04-03 LAB — COMPREHENSIVE METABOLIC PANEL
ALT: 9 U/L (ref 0–35)
AST: 18 U/L (ref 0–37)
Albumin: 2.4 g/dL — ABNORMAL LOW (ref 3.5–5.2)
Alkaline Phosphatase: 85 U/L (ref 39–117)
BUN: 27 mg/dL — AB (ref 6–23)
CO2: 26 mEq/L (ref 19–32)
CREATININE: 1 mg/dL (ref 0.50–1.10)
Calcium: 8.5 mg/dL (ref 8.4–10.5)
Chloride: 99 mEq/L (ref 96–112)
GFR calc non Af Amer: 47 mL/min — ABNORMAL LOW (ref >90)
GFR, EST AFRICAN AMERICAN: 55 mL/min — AB (ref >90)
GLUCOSE: 138 mg/dL — AB (ref 70–99)
Potassium: 4.1 mEq/L (ref 3.7–5.3)
Sodium: 137 mEq/L (ref 137–147)
TOTAL PROTEIN: 5.4 g/dL — AB (ref 6.0–8.3)
Total Bilirubin: <0.2 mg/dL — ABNORMAL LOW (ref 0.3–1.2)

## 2014-04-03 LAB — URINALYSIS, ROUTINE W REFLEX MICROSCOPIC
BILIRUBIN URINE: NEGATIVE
Glucose, UA: NEGATIVE mg/dL
Hgb urine dipstick: NEGATIVE
KETONES UR: NEGATIVE mg/dL
Leukocytes, UA: NEGATIVE
NITRITE: NEGATIVE
PH: 6 (ref 5.0–8.0)
Protein, ur: NEGATIVE mg/dL
Specific Gravity, Urine: 1.019 (ref 1.005–1.030)
UROBILINOGEN UA: 1 mg/dL (ref 0.0–1.0)

## 2014-04-03 LAB — CBC WITH DIFFERENTIAL/PLATELET
BASOS PCT: 0 % (ref 0–1)
Basophils Absolute: 0 10*3/uL (ref 0.0–0.1)
Basophils Absolute: 0 10*3/uL (ref 0.0–0.1)
Basophils Relative: 0 % (ref 0–1)
Eosinophils Absolute: 0.1 10*3/uL (ref 0.0–0.7)
Eosinophils Absolute: 0.1 10*3/uL (ref 0.0–0.7)
Eosinophils Relative: 1 % (ref 0–5)
Eosinophils Relative: 2 % (ref 0–5)
HCT: 27.2 % — ABNORMAL LOW (ref 36.0–46.0)
HEMATOCRIT: 30.9 % — AB (ref 36.0–46.0)
HEMOGLOBIN: 9.1 g/dL — AB (ref 12.0–15.0)
Hemoglobin: 10 g/dL — ABNORMAL LOW (ref 12.0–15.0)
Lymphocytes Relative: 25 % (ref 12–46)
Lymphocytes Relative: 21 % (ref 12–46)
Lymphs Abs: 2.2 10*3/uL (ref 0.7–4.0)
Lymphs Abs: 1.5 10*3/uL (ref 0.7–4.0)
MCH: 29.8 pg (ref 26.0–34.0)
MCH: 30.4 pg (ref 26.0–34.0)
MCHC: 32.4 g/dL (ref 30.0–36.0)
MCHC: 33.5 g/dL (ref 30.0–36.0)
MCV: 92 fL (ref 78.0–100.0)
MCV: 91 fL (ref 78.0–100.0)
MONO ABS: 0.4 10*3/uL (ref 0.1–1.0)
MONOS PCT: 5 % (ref 3–12)
MONOS PCT: 6 % (ref 3–12)
Monocytes Absolute: 0.4 10*3/uL (ref 0.1–1.0)
NEUTROS ABS: 5.1 10*3/uL (ref 1.7–7.7)
Neutro Abs: 6 10*3/uL (ref 1.7–7.7)
Neutrophils Relative %: 69 % (ref 43–77)
Neutrophils Relative %: 71 % (ref 43–77)
PLATELETS: 332 10*3/uL (ref 150–400)
Platelets: 434 10*3/uL — ABNORMAL HIGH (ref 150–400)
RBC: 3.36 MIL/uL — ABNORMAL LOW (ref 3.87–5.11)
RBC: 2.99 MIL/uL — AB (ref 3.87–5.11)
RDW: 13.7 % (ref 11.5–15.5)
RDW: 13.6 % (ref 11.5–15.5)
WBC: 8.7 10*3/uL (ref 4.0–10.5)
WBC: 7.2 10*3/uL (ref 4.0–10.5)

## 2014-04-03 LAB — BASIC METABOLIC PANEL
BUN: 29 mg/dL — ABNORMAL HIGH (ref 6–23)
CO2: 26 mEq/L (ref 19–32)
CREATININE: 1.01 mg/dL (ref 0.50–1.10)
Calcium: 9.5 mg/dL (ref 8.4–10.5)
Chloride: 100 mEq/L (ref 96–112)
GFR calc Af Amer: 54 mL/min — ABNORMAL LOW (ref >90)
GFR, EST NON AFRICAN AMERICAN: 47 mL/min — AB (ref >90)
Glucose, Bld: 100 mg/dL — ABNORMAL HIGH (ref 70–99)
Potassium: 4.3 mEq/L (ref 3.7–5.3)
Sodium: 140 mEq/L (ref 137–147)

## 2014-04-03 LAB — I-STAT TROPONIN, ED: Troponin i, poc: 0.01 ng/mL (ref 0.00–0.08)

## 2014-04-03 LAB — I-STAT CG4 LACTIC ACID, ED: Lactic Acid, Venous: 1.11 mmol/L (ref 0.5–2.2)

## 2014-04-03 LAB — STREP PNEUMONIAE URINARY ANTIGEN: Strep Pneumo Urinary Antigen: NEGATIVE

## 2014-04-03 LAB — PROTIME-INR
INR: 1.09 (ref 0.00–1.49)
INR: 1.13 (ref 0.00–1.49)
PROTHROMBIN TIME: 13.9 s (ref 11.6–15.2)
PROTHROMBIN TIME: 14.3 s (ref 11.6–15.2)

## 2014-04-03 LAB — PHOSPHORUS: PHOSPHORUS: 3 mg/dL (ref 2.3–4.6)

## 2014-04-03 LAB — APTT: aPTT: 43 seconds — ABNORMAL HIGH (ref 24–37)

## 2014-04-03 LAB — MAGNESIUM: Magnesium: 1.6 mg/dL (ref 1.5–2.5)

## 2014-04-03 MED ORDER — DEXTROSE 5 % IV SOLN: 500.0000 mg | INTRAVENOUS | Status: DC

## 2014-04-03 MED ORDER — ONDANSETRON HCL 4 MG/2ML IJ SOLN
4.0000 mg | Freq: Four times a day (QID) | INTRAMUSCULAR | Status: DC | PRN
  Administered 2014-04-05: 4 mg via INTRAVENOUS
  Filled 2014-04-03: qty 2

## 2014-04-03 MED ORDER — ACETAMINOPHEN 650 MG RE SUPP: 650.0000 mg | Freq: Four times a day (QID) | RECTAL | Status: DC | PRN

## 2014-04-03 MED ORDER — LEVOFLOXACIN IN D5W 750 MG/150ML IV SOLN
750.0000 mg | INTRAVENOUS | Status: DC
  Administered 2014-04-03: 750 mg via INTRAVENOUS
  Filled 2014-04-03: qty 150

## 2014-04-03 MED ORDER — OXYCODONE HCL 5 MG PO TABS
10.0000 mg | ORAL_TABLET | Freq: Four times a day (QID) | ORAL | Status: DC | PRN
  Administered 2014-04-04 – 2014-04-05 (×5): 10 mg via ORAL
  Filled 2014-04-03 (×5): qty 2

## 2014-04-03 MED ORDER — RIVAROXABAN 10 MG PO TABS
10.0000 mg | ORAL_TABLET | Freq: Every day | ORAL | Status: DC
  Administered 2014-04-04 – 2014-04-06 (×3): 10 mg via ORAL
  Filled 2014-04-03 (×3): qty 1

## 2014-04-03 MED ORDER — SENNOSIDES-DOCUSATE SODIUM 8.6-50 MG PO TABS
2.0000 | ORAL_TABLET | Freq: Every morning | ORAL | Status: DC
  Administered 2014-04-04 – 2014-04-06 (×3): 2 via ORAL
  Filled 2014-04-03 (×3): qty 2

## 2014-04-03 MED ORDER — LEVOFLOXACIN IN D5W 750 MG/150ML IV SOLN: 750.0000 mg | INTRAVENOUS | Status: DC

## 2014-04-03 MED ORDER — ROPINIROLE HCL 1 MG PO TABS
5.0000 mg | ORAL_TABLET | Freq: Every day | ORAL | Status: DC
  Administered 2014-04-03 – 2014-04-05 (×3): 5 mg via ORAL
  Filled 2014-04-03 (×4): qty 5

## 2014-04-03 MED ORDER — LORATADINE 10 MG PO TABS
10.0000 mg | ORAL_TABLET | Freq: Every day | ORAL | Status: DC
  Administered 2014-04-04 – 2014-04-06 (×3): 10 mg via ORAL
  Filled 2014-04-03 (×3): qty 1

## 2014-04-03 MED ORDER — DEXTROSE 5 % IV SOLN: 1.0000 g | INTRAVENOUS | Status: DC

## 2014-04-03 MED ORDER — ACETAMINOPHEN 325 MG PO TABS
650.0000 mg | ORAL_TABLET | Freq: Four times a day (QID) | ORAL | Status: DC | PRN
  Administered 2014-04-04 – 2014-04-05 (×3): 650 mg via ORAL
  Filled 2014-04-03 (×3): qty 2

## 2014-04-03 MED ORDER — DEXTROSE 5 % IV SOLN
1.0000 g | Freq: Once | INTRAVENOUS | Status: AC
  Administered 2014-04-03: 1 g via INTRAVENOUS
  Filled 2014-04-03: qty 10

## 2014-04-03 MED ORDER — SODIUM CHLORIDE 0.9 % IV SOLN
INTRAVENOUS | Status: DC
  Administered 2014-04-03: 22:00:00 via INTRAVENOUS

## 2014-04-03 MED ORDER — PANTOPRAZOLE SODIUM 40 MG PO TBEC
40.0000 mg | DELAYED_RELEASE_TABLET | Freq: Every day | ORAL | Status: DC
  Administered 2014-04-03 – 2014-04-06 (×4): 40 mg via ORAL
  Filled 2014-04-03 (×4): qty 1

## 2014-04-03 MED ORDER — LOSARTAN POTASSIUM 50 MG PO TABS
50.0000 mg | ORAL_TABLET | Freq: Every day | ORAL | Status: DC
  Administered 2014-04-04 – 2014-04-06 (×3): 50 mg via ORAL
  Filled 2014-04-03 (×3): qty 1

## 2014-04-03 MED ORDER — MIRTAZAPINE 7.5 MG PO TABS
7.5000 mg | ORAL_TABLET | Freq: Every day | ORAL | Status: DC
  Administered 2014-04-03 – 2014-04-05 (×3): 7.5 mg via ORAL
  Filled 2014-04-03 (×4): qty 1

## 2014-04-03 MED ORDER — DICLOFENAC SODIUM 1 % TD GEL
2.0000 g | Freq: Two times a day (BID) | TRANSDERMAL | Status: DC | PRN
  Administered 2014-04-04: 2 g via TOPICAL
  Filled 2014-04-03: qty 100

## 2014-04-03 MED ORDER — MIRABEGRON ER 25 MG PO TB24
25.0000 mg | ORAL_TABLET | Freq: Every morning | ORAL | Status: DC
  Administered 2014-04-04 – 2014-04-06 (×3): 25 mg via ORAL
  Filled 2014-04-03 (×3): qty 1

## 2014-04-03 MED ORDER — CYANOCOBALAMIN 1000 MCG/ML IJ SOLN: 1000.0000 ug | INTRAMUSCULAR | Status: DC

## 2014-04-03 MED ORDER — DEXTROSE 5 % IV SOLN
500.0000 mg | Freq: Once | INTRAVENOUS | Status: AC
  Administered 2014-04-03: 500 mg via INTRAVENOUS

## 2014-04-03 MED ORDER — METFORMIN HCL 500 MG PO TABS
500.0000 mg | ORAL_TABLET | Freq: Two times a day (BID) | ORAL | Status: DC
  Administered 2014-04-04 – 2014-04-06 (×5): 500 mg via ORAL
  Filled 2014-04-03 (×7): qty 1

## 2014-04-03 MED ORDER — ONDANSETRON HCL 4 MG PO TABS
4.0000 mg | ORAL_TABLET | Freq: Four times a day (QID) | ORAL | Status: DC | PRN
Start: 2014-04-03 — End: 2014-04-06

## 2014-04-03 MED ORDER — LEVOFLOXACIN IN D5W 750 MG/150ML IV SOLN
750.0000 mg | INTRAVENOUS | Status: DC
  Administered 2014-04-05: 750 mg via INTRAVENOUS
  Filled 2014-04-03: qty 150

## 2014-04-03 MED ORDER — SODIUM CHLORIDE 0.9 % IV SOLN
INTRAVENOUS | Status: DC
  Administered 2014-04-03 (×2): via INTRAVENOUS
  Administered 2014-04-05: 100 mL/h via INTRAVENOUS

## 2014-04-03 NOTE — ED Provider Notes (Signed)
TIME SEEN: 4:22 PM  CHIEF COMPLAINT: Cough   HPI: Pt is a 80 y.o. F with history of hypertension, hyperlipidemia, polymyalgia rheumatica, left lower extremity DVT, diabetes who presents emergency department with 7 weeks of cough with yellow sputum production. She has been on tobramycin by her PCP for several days without improvement. She denies any fevers but has had chills. Denies any chest pain or chest discomfort. She states she has had some shortness of breath.  ROS: See HPI Constitutional: no fever  Eyes: no drainage  ENT: no runny nose   Cardiovascular:  no chest pain  Resp: n SOB  GI: no vomiting GU: no dysuria Integumentary: no rash  Allergy: no hives  Musculoskeletal: no leg swelling  Neurological: no slurred speech ROS otherwise negative  PAST MEDICAL HISTORY/PAST SURGICAL HISTORY:  Past Medical History  Diagnosis Date  . Cyst and pseudocyst of pancreas   . Unspecified essential hypertension   . Other and unspecified hyperlipidemia   . Polymyalgia rheumatica   . Personal history of unspecified digestive disease   . Restless legs syndrome (RLS)   . Insomnia, unspecified   . Other malaise and fatigue   . Diverticulosis of colon (without mention of hemorrhage)   . Acute gastritis without mention of hemorrhage   . Duodenitis without mention of hemorrhage   . Diaphragmatic hernia without mention of obstruction or gangrene   . Esophageal reflux   . Stricture and stenosis of esophagus   . OAB (overactive bladder)   . Left leg DVT jan 2013  . Unspecified sleep apnea     hx of sleep apnea, none since weight loss  . Diabetes mellitus     MEDICATIONS:  Prior to Admission medications   Medication Sig Start Date End Date Taking? Authorizing Provider  amLODipine (NORVASC) 2.5 MG tablet Take 2.5 mg by mouth daily. Stop Cozaar    Historical Provider, MD  cyanocobalamin (,VITAMIN B-12,) 1000 MCG/ML injection Inject 1,000 mcg into the muscle every 30 (thirty) days.     Historical Provider, MD  L-Methylfolate-Algae-B12-B6 Glade Stanford) 3-90.314-2-35 MG CAPS Take 1 capsule by mouth daily.    Historical Provider, MD  metFORMIN (GLUCOPHAGE) 500 MG tablet Take 500 mg by mouth 2 (two) times daily with a meal.     Historical Provider, MD  mirtazapine (REMERON) 7.5 MG tablet Take 7.5 mg by mouth at bedtime.    Historical Provider, MD  ondansetron (ZOFRAN ODT) 8 MG disintegrating tablet 51m ODT q4 hours prn nausea 12/24/13   HJarrett SohoMuthersbaugh, PA-C  oxyCODONE (OXY IR/ROXICODONE) 5 MG immediate release tablet Take 5-10 mg by mouth every 6 (six) hours as needed for pain.    Historical Provider, MD  pantoprazole (PROTONIX) 40 MG tablet Take 40 mg by mouth daily.    Historical Provider, MD  rivaroxaban (XARELTO) 10 MG TABS tablet Take 10 mg by mouth daily.    Historical Provider, MD  ropinirole (REQUIP) 5 MG tablet Take 5 mg by mouth at bedtime.    Historical Provider, MD  VOLTAREN 1 % GEL Apply 2 g topically 2 (two) times daily as needed (for pain). On back 07/19/13   Historical Provider, MD    ALLERGIES:  Allergies  Allergen Reactions  . Sulfa Antibiotics Nausea And Vomiting  . Penicillins Hives and Rash    SOCIAL HISTORY:  History  Substance Use Topics  . Smoking status: Never Smoker   . Smokeless tobacco: Never Used  . Alcohol Use: No    FAMILY HISTORY: Family History  Problem Relation Age of Onset  . Melanoma Daughter     died age 38  . Melanoma Son     EXAM: BP 129/67  Pulse 93  Temp(Src) 98.2 F (36.8 C) (Oral)  Resp 18  SpO2 97% CONSTITUTIONAL: Alert and oriented and responds appropriately to questions. Well-appearing; well-nourished HEAD: Normocephalic EYES: Conjunctivae clear, PERRL ENT: normal nose; no rhinorrhea; moist mucous membranes; pharynx without lesions noted NECK: Supple, no meningismus, no LAD  CARD: RRR; S1 and S2 appreciated; no murmurs, no clicks, no rubs, no gallops RESP: Normal chest excursion without splinting or tachypnea;  breath sounds equal bilaterally without reason rails the patient does have diffuse rhonchi, no respiratory distress or hypoxia ABD/GI: Normal bowel sounds; non-distended; soft, non-tender, no rebound, no guarding BACK:  The back appears normal and is non-tender to palpation, there is no CVA tenderness EXT: Normal ROM in all joints; non-tender to palpation; no edema; normal capillary refill; no cyanosis    SKIN: Normal color for age and race; warm NEURO: Moves all extremities equally PSYCH: The patient's mood and manner are appropriate. Grooming and personal hygiene are appropriate.  MEDICAL DECISION MAKING: Patient here with concern for pneumonia. She is hemodynamically stable and not hypoxic but does appear uncomfortable. We'll obtain labs, chest x-ray and reassess.  ED PROGRESS: Patient's labs are unremarkable. Urine shows no sign of infection. Troponin negative. Her chest x-ray does show patchy densities predominantly at the left lower lobe that is concerning for infection.Burnis Medin treat with ceftriaxone and azithromycin for community-acquired pneumonia. Discussed with hospitalist for admission. Patient comfortable with this plan.   EKG Interpretation  Date/Time:  Monday April 03 2014 17:24:28 EDT Ventricular Rate:  90 PR Interval:  176 QRS Duration: 125 QT Interval:  403 QTC Calculation: 493 R Axis:   -71 Text Interpretation:  Sinus rhythm RBBB and LAFB No significant change since last tracing Confirmed by Reya Aurich,  DO, Gwenna Fuston [phone]) on 04/03/2014 5:33:14 PM        Crockett, DO 04/03/14 2306

## 2014-04-03 NOTE — Progress Notes (Signed)
ANTIBIOTIC CONSULT NOTE - INITIAL  Pharmacy Consult for Levofloxacin (rx may adjust for renal function) Indication: pneumonia  Allergies  Allergen Reactions  . Sulfa Antibiotics Nausea And Vomiting  . Penicillins Hives and Rash    Patient Measurements:   Adjusted Body Weight:   Vital Signs: Temp: 98 F (36.7 C) (04/27 1955) Temp src: Oral (04/27 1955) BP: 128/51 mmHg (04/27 2000) Pulse Rate: 77 (04/27 1955) Intake/Output from previous day:   Intake/Output from this shift:    Labs:  Recent Labs  04/03/14 1745  WBC 8.7  HGB 10.0*  PLT 434*  CREATININE 1.01   The CrCl is unknown because both a height and weight (above a minimum accepted value) are required for this calculation. No results found for this basename: VANCOTROUGH, VANCOPEAK, VANCORANDOM, GENTTROUGH, GENTPEAK, GENTRANDOM, TOBRATROUGH, TOBRAPEAK, TOBRARND, AMIKACINPEAK, AMIKACINTROU, AMIKACIN,  in the last 72 hours   Microbiology: No results found for this or any previous visit (from the past 720 hour(s)).  Medical History: Past Medical History  Diagnosis Date  . Cyst and pseudocyst of pancreas   . Unspecified essential hypertension   . Other and unspecified hyperlipidemia   . Polymyalgia rheumatica   . Personal history of unspecified digestive disease   . Restless legs syndrome (RLS)   . Insomnia, unspecified   . Other malaise and fatigue   . Diverticulosis of colon (without mention of hemorrhage)   . Acute gastritis without mention of hemorrhage   . Duodenitis without mention of hemorrhage   . Diaphragmatic hernia without mention of obstruction or gangrene   . Esophageal reflux   . Stricture and stenosis of esophagus   . OAB (overactive bladder)   . Left leg DVT jan 2013  . Unspecified sleep apnea     hx of sleep apnea, none since weight loss  . Diabetes mellitus    Assessment: 46 YOF presenting with cough and SOB, given ceftriaxone and azithromycin in ED.  TRH ordering levofloxacin 729m IV  q24h for CAP at time of admission.   4/27 >>levofloxacin  >>  Tmax: afeb WBCs: 8.7 Renal: SCr = 1.01 for est CrCL ~219mmin  4/27 blood:  4/27 influenza PCR: 4/  S. pneumo Ag 4/  [place] Ag:   Goal of Therapy:  Dose appropriately for renal function  Plan:  Change levofloxacin to 7504mV q48 x 2 additional doses (dose given in ED) to complete 5-days therapy as orginally ordered (pharmacy will follow at distance, write note if dose change needed)  Thanks,  DusDoreene ElandharmD, BCPS.   Pager: [phone]/27/2015,8:33 PM

## 2014-04-03 NOTE — Progress Notes (Signed)
  CARE MANAGEMENT ED NOTE 04/03/2014  Patient:  JAZZMA, SCHRUM   Account Number:  [id number]  Date Initiated:  04/03/2014  Documentation initiated by:  Livia Snellen  Subjective/Objective Assessment:   Patient presents to Ed with cough.  previously diagnosed with bronchitis, symptoms not improving.     Subjective/Objective Assessment Detail:     Action/Plan:   Action/Plan Detail:   Anticipated DC Date:       Status Recommendation to Physician:   Result of Recommendation:    Other ED Services  Consult Working [place]  Other    Choice offered to / List presented to:  [name]          Status of service:  Completed, signed off  ED Comments:   ED Comments Detail:  EDCM spoke to patient at bedside.  Patient lives at home alone.  Patient reports she has a son and a grand daughter who come in and check on her.  Patient also reports she has a friend [name] who comes at least three times a week or whenever she needs her.  Evelynn usually goes to patient's house at 1130am and stays for about four hours depending on what the patient needs.  Patient reports [name] has been with her all day today.  Patient reports hse has two walkers, one with a seat, a cane and a shower chair at home.  Patient does not have any home health services at this time.  Patient reportss her son's name is Gene, his phone number is [phone].  Patient confirms her pcp is Dr. Kirk Ruths Bonsu.  EDCM provided patient with printed list of home health agencies in [facility].  EDCM explained to patient with home health, the patient may receive a visiting RN, PT, OT, aide and social worker if needed. Patient thankful for resources.  No further EDCM needs at this time.

## 2014-04-03 NOTE — ED Notes (Signed)
Per pt, states cough later diagnosed as bronchitis, not getting any better so they referred to ED for admission

## 2014-04-03 NOTE — ED Notes (Signed)
Pt walked to the bathroom, 93% was the lowest that I saw with a good wave-form.

## 2014-04-03 NOTE — H&P (Addendum)
[facility] History and Physical  [name] [id number] DOB: 12-20-1918 DOA: 04/03/2014  Referring physician: ER physician PCP: Benito Mccreedy, MD   Chief Complaint: shortness of breath  HPI:  80 year old female with past medical history of DVT on anticoagulation with xarelto, hypertension, diabetes, overactive bladder, GERD, duodenitis who presented to [facility] ED 04/03/2014 with worsening shortness of breath and cough for past few weeks and getting worse over few days prior to this admission. She was apparently on vibramycin for bronchitis outpatient but no significant improvement noted. No associated fevers or chills but she did report productive cough of yellowish sputum. No chest pain, no palpitations. No abdominal pain, nausea or vomiting. No reports of blood in stool or urine. No lightheadedness or loss of consciousness. In ED, vitals are stable with BP of 109/62, HR 77, oxygen saturation of 90 % on room air. Blood work showed hemoglobin of 10 otherwise unremarkable. CXR showed possible left lower lobe pneumonia and she was given azithromycin and rocephin in ED. She was admitted for further evaluation and management of pneumonia.   Assessment and Plan:  Principal Problem:   CAP (community acquired pneumonia), Acute respiratory failure with hypoxia - based on admission CXR pneumonia in left lung base - pneumonia order set in place; please follow up blood culture results, resp culture results, legionella, influenza and strep pneumonia results - given azithromycin and rocephin but pt has PCN allergy so will startLevaquin as an antibiotic of choice - oxygen support via nasal canula to keep O2 saturation above 90%  Active Problems:   HYPERTENSION - continue losartan - normal renal function   GERD - continue protoix   INSOMNIA - continue remeron   Overactive bladder - continue myrbetriq   DVT of leg (deep venous thrombosis) - continue xarelto   Anemia of chronic  disease - secondary to history of anticoagulation - hemoglobin stable at 10 and no current indications for transfusion    History of diabetes - check A1c; not sure if metformin is really necessary in this 80 year old female  - if A1c is WNL maybe she does not require metformin, please assess once A1c result is available    RESTLESS LEG SYNDROME - continue requip  Radiological Exams on Admission: Dg Chest 2 View 04/03/2014    IMPRESSION: Patchy densities in the lungs, predominantly at the left lung base. Some of these densities were present on the previous examination. Findings are concerning for an infectious etiology. Recommend follow up to ensure resolution.     EKG: Normal sinus rhythm  Code Status: Full Family Communication: Pt at bedside Disposition Plan: Admit for further evaluation  Robbie Lis, MD  [facility] Pager [phone]  Review of Systems:  Constitutional: Negative for fever, chills and malaise/fatigue. Negative for diaphoresis.  HENT: Negative for hearing loss, ear pain, nosebleeds, congestion, sore throat, neck pain, tinnitus and ear discharge.   Eyes: Negative for blurred vision, double vision, photophobia, pain, discharge and redness.  Respiratory: per HPI.   Cardiovascular: Negative for chest pain, palpitations, orthopnea, claudication and leg swelling.  Gastrointestinal: Negative for nausea, vomiting and abdominal pain. Negative for heartburn, constipation, blood in stool and melena.  Genitourinary: Negative for dysuria, urgency, frequency, hematuria and flank pain.  Musculoskeletal: Negative for myalgias, back pain, joint pain and falls.  Skin: Negative for itching and rash.  Neurological: Negative for dizziness and weakness. Negative for tingling, tremors, sensory change, speech change, focal weakness, loss of consciousness and headaches.  Endo/Heme/Allergies: Negative for environmental  allergies and polydipsia. Does not bruise/bleed easily.   Psychiatric/Behavioral: Negative for suicidal ideas. The patient is not nervous/anxious.      Past Medical History  Diagnosis Date  . Cyst and pseudocyst of pancreas   . Unspecified essential hypertension   . Other and unspecified hyperlipidemia   . Polymyalgia rheumatica   . Personal history of unspecified digestive disease   . Restless legs syndrome (RLS)   . Insomnia, unspecified   . Other malaise and fatigue   . Diverticulosis of colon (without mention of hemorrhage)   . Acute gastritis without mention of hemorrhage   . Duodenitis without mention of hemorrhage   . Diaphragmatic hernia without mention of obstruction or gangrene   . Esophageal reflux   . Stricture and stenosis of esophagus   . OAB (overactive bladder)   . Left leg DVT jan 2013  . Unspecified sleep apnea     hx of sleep apnea, none since weight loss  . Diabetes mellitus    Past Surgical History  Procedure Laterality Date  . Oophorectomy  1944    not sure which ovary  . Rotator cuff repair  right     4-5 yrs ago  . Eye surgery      cataracts removed-bilateral eyes  . Back surgery  2009    lower back  . Colon surgery  June 17, 2012    surgery for bleed  . Shoulder open rotator cuff repair  08/04/2012    Procedure: ROTATOR CUFF REPAIR SHOULDER OPEN;  Surgeon: Tobi Bastos, MD;  Location: [facility];  Service: Orthopedics;  Laterality: Left;  with Anchors and Graft   Social History:  reports that she has never smoked. She has never used smokeless tobacco. She reports that she does not drink alcohol or use illicit drugs.  Allergies  Allergen Reactions  . Sulfa Antibiotics Nausea And Vomiting  . Penicillins Hives and Rash    Family History  Problem Relation Age of Onset  . Melanoma Daughter     died age 30  . Melanoma Son      Prior to Admission medications   Medication Sig Start Date End Date Taking? Authorizing Provider  cetirizine (ZYRTEC) 10 MG tablet Take 10 mg by mouth daily.   Yes  Historical Provider, MD  losartan (COZAAR) 50 MG tablet Take 50 mg by mouth daily.   Yes Historical Provider, MD  metFORMIN (GLUCOPHAGE) 500 MG tablet Take 500 mg by mouth 2 (two) times daily with a meal.    Yes Historical Provider, MD  mirabegron ER (MYRBETRIQ) 25 MG TB24 tablet Take 25 mg by mouth every morning.   Yes Historical Provider, MD  mirtazapine (REMERON) 7.5 MG tablet Take 7.5 mg by mouth at bedtime.   Yes Historical Provider, MD  oxyCODONE (OXY IR/ROXICODONE) 5 MG immediate release tablet Take 10 mg by mouth every 6 (six) hours as needed for moderate pain.    Yes Historical Provider, MD  PRESCRIPTION MEDICATION Antibiotic shot - name unknown at Dr. Doristine Section -Bonsu   Yes Historical Provider, MD  rivaroxaban (XARELTO) 10 MG TABS tablet Take 10 mg by mouth daily.   Yes Historical Provider, MD  ropinirole (REQUIP) 5 MG tablet Take 5 mg by mouth at bedtime.   Yes Historical Provider, MD  senna-docusate (SENOKOT-S) 8.6-50 MG per tablet Take 2 tablets by mouth every morning.   Yes Historical Provider, MD  VOLTAREN 1 % GEL Apply 2 g topically 2 (two) times daily as needed (for pain).  On back 07/19/13  Yes Historical Provider, MD  cyanocobalamin (,VITAMIN B-12,) 1000 MCG/ML injection Inject 1,000 mcg into the muscle every 30 (thirty) days.    Historical Provider, MD   Physical Exam: Filed Vitals:   04/03/14 1843 04/03/14 1900 04/03/14 1930 04/03/14 1955  BP:  109/62 123/69 125/62  Pulse:    77  Temp:    98 F (36.7 C)  TempSrc:    Oral  Resp:  0 13 16  SpO2: 96%   97%    Physical Exam  Constitutional: Appears ill but not in acute dsitress HENT: Normocephalic. Dry mucus membranes Eyes: Conjunctivae and EOM are normal. PERRLA, no scleral icterus.  Neck: Normal ROM. Neck supple. No JVD. No tracheal deviation.  CVS: RRR, S1/S2 appreciated  Pulmonary: diminished breath sounds but no wheeizng, no stridor, rhonchi  Abdominal: Soft. BS +,  no distension, tenderness, rebound or guarding.   Musculoskeletal: Normal range of motion. No edema and no tenderness.  Lymphadenopathy: No lymphadenopathy noted, cervical, inguinal. Neuro: Alert. No focal neurologic deficits. Skin: Skin is warm and dry.  Psychiatric: Normal mood and affect. Behavior, judgment, thought content normal.   Labs on Admission:  Basic Metabolic Panel:  Recent Labs Lab 04/03/14 1745  NA 140  K 4.3  CL 100  CO2 26  GLUCOSE 100*  BUN 29*  CREATININE 1.01  CALCIUM 9.5   Liver Function Tests: No results found for this basename: AST, ALT, ALKPHOS, BILITOT, PROT, ALBUMIN,  in the last 168 hours No results found for this basename: LIPASE, AMYLASE,  in the last 168 hours No results found for this basename: AMMONIA,  in the last 168 hours CBC:  Recent Labs Lab 04/03/14 1745  WBC 8.7  NEUTROABS 6.0  HGB 10.0*  HCT 30.9*  MCV 92.0  PLT 434*   Cardiac Enzymes: No results found for this basename: CKTOTAL, CKMB, CKMBINDEX, TROPONINI,  in the last 168 hours BNP: No components found with this basename: POCBNP,  CBG: No results found for this basename: GLUCAP,  in the last 168 hours  If 7PM-7AM, please contact night-coverage [email or web address] Password [facility] 04/03/2014, 8:09 PM

## 2014-04-04 DIAGNOSIS — D638 Anemia in other chronic diseases classified elsewhere: Secondary | ICD-10-CM | POA: Diagnosis not present

## 2014-04-04 DIAGNOSIS — I82409 Acute embolism and thrombosis of unspecified deep veins of unspecified lower extremity: Secondary | ICD-10-CM | POA: Diagnosis not present

## 2014-04-04 DIAGNOSIS — E440 Moderate protein-calorie malnutrition: Secondary | ICD-10-CM

## 2014-04-04 DIAGNOSIS — J189 Pneumonia, unspecified organism: Secondary | ICD-10-CM | POA: Diagnosis not present

## 2014-04-04 LAB — LEGIONELLA ANTIGEN, URINE: Legionella Antigen, Urine: NEGATIVE

## 2014-04-04 LAB — CBC
HCT: 27.3 % — ABNORMAL LOW (ref 36.0–46.0)
HEMOGLOBIN: 8.6 g/dL — AB (ref 12.0–15.0)
MCH: 29.2 pg (ref 26.0–34.0)
MCHC: 31.5 g/dL (ref 30.0–36.0)
MCV: 92.5 fL (ref 78.0–100.0)
PLATELETS: 350 10*3/uL (ref 150–400)
RBC: 2.95 MIL/uL — AB (ref 3.87–5.11)
RDW: 13.5 % (ref 11.5–15.5)
WBC: 5.4 10*3/uL (ref 4.0–10.5)

## 2014-04-04 LAB — COMPREHENSIVE METABOLIC PANEL
ALT: 9 U/L (ref 0–35)
AST: 17 U/L (ref 0–37)
Albumin: 2.4 g/dL — ABNORMAL LOW (ref 3.5–5.2)
Alkaline Phosphatase: 83 U/L (ref 39–117)
BUN: 25 mg/dL — AB (ref 6–23)
CALCIUM: 8.4 mg/dL (ref 8.4–10.5)
CO2: 27 mEq/L (ref 19–32)
CREATININE: 1.05 mg/dL (ref 0.50–1.10)
Chloride: 101 mEq/L (ref 96–112)
GFR calc Af Amer: 52 mL/min — ABNORMAL LOW (ref >90)
GFR calc non Af Amer: 45 mL/min — ABNORMAL LOW (ref >90)
Glucose, Bld: 86 mg/dL (ref 70–99)
Potassium: 4.4 mEq/L (ref 3.7–5.3)
Sodium: 139 mEq/L (ref 137–147)
TOTAL PROTEIN: 5.5 g/dL — AB (ref 6.0–8.3)

## 2014-04-04 LAB — INFLUENZA PANEL BY PCR (TYPE A & B)
H1N1 flu by pcr: NOT DETECTED
INFLAPCR: NEGATIVE
Influenza B By PCR: NEGATIVE

## 2014-04-04 LAB — EXPECTORATED SPUTUM ASSESSMENT W REFEX TO RESP CULTURE

## 2014-04-04 LAB — GLUCOSE, CAPILLARY: Glucose-Capillary: 88 mg/dL (ref 70–99)

## 2014-04-04 LAB — HEMOGLOBIN A1C
Hgb A1c MFr Bld: 6.6 % — ABNORMAL HIGH (ref <5.7)
Mean Plasma Glucose: 143 mg/dL — ABNORMAL HIGH (ref <117)

## 2014-04-04 LAB — TSH: TSH: >100 u[IU]/mL — ABNORMAL HIGH (ref 0.350–4.500)

## 2014-04-04 LAB — T4, FREE: Free T4: 0.36 ng/dL — ABNORMAL LOW (ref 0.80–1.80)

## 2014-04-04 MED ORDER — ENSURE COMPLETE PO LIQD
237.0000 mL | Freq: Two times a day (BID) | ORAL | Status: DC
  Administered 2014-04-05: 237 mL via ORAL

## 2014-04-04 MED ORDER — LEVOTHYROXINE SODIUM 25 MCG PO TABS
25.0000 ug | ORAL_TABLET | Freq: Every day | ORAL | Status: DC
  Administered 2014-04-05 – 2014-04-06 (×2): 25 ug via ORAL
  Filled 2014-04-04 (×4): qty 1

## 2014-04-04 MED ORDER — POLYETHYLENE GLYCOL 3350 17 G PO PACK
17.0000 g | PACK | Freq: Every day | ORAL | Status: DC | PRN
  Filled 2014-04-04: qty 1

## 2014-04-04 MED ORDER — GLUCERNA SHAKE PO LIQD
237.0000 mL | Freq: Four times a day (QID) | ORAL | Status: DC
  Administered 2014-04-04 (×2): 237 mL via ORAL
  Filled 2014-04-04 (×4): qty 237

## 2014-04-04 NOTE — Progress Notes (Signed)
INITIAL NUTRITION ASSESSMENT  DOCUMENTATION CODES Per approved criteria  -Non-severe (moderate) malnutrition in the context of chronic illness  Pt meets criteria for moderate MALNUTRITION in the context of chronic illness as evidenced by 20% weight loss in one year, moderate muscle wasting and subcutaneous fat loss.   INTERVENTION: -Recommend Glucerna Shake po QID, each supplement provides 220 kcal and 10 grams of protein -Encouraged intake of high protein/kcal snacks for weight maintenance  -Will continue to montior  NUTRITION DIAGNOSIS: Unintentional wt loss related to decreased ability to consume sufficient energy as evidenced by 25-30 lbs weight loss in one year.   Goal: Pt to meet >/= 90% of their estimated nutrition needs    Monitor:  Total protein/energy intake, labs, weights,   Reason for Assessment: MST  80 y.o. female  Admitting Dx: CAP (community acquired pneumonia)  ASSESSMENT: 80 year old female with past medical history of DVT on anticoagulation with xarelto, hypertension, diabetes, overactive bladder, GERD, duodenitis who presented to [facility] ED 04/03/2014 with worsening shortness of breath and cough for past few weeks and getting worse over few days prior to this admission  -Pt noted 30 lbs unintentional wt loss over one year period -Receives meal assistance with grocery shopping and preparation of lunch/dinner 3x/week. Pt is unable to stand long enough to prepare meals d/t leg pain. Leg pain also has inhibited pt from performing daily exercises -Pt consumes 4-5 Glucerna shakes daily and two meals/day. Tries to snack on easy to eat foods such as cheese sticks, or peanut butter crackers -Endorsed a loss of taste, which pt has tried to improve by adding extra spices and hot flavors to encourage PO -Current PO intake <50% -Has had esophagus stretched several times, is tolerating regular diet texture as pt is able to cut tough meats into small pieces to tolerate -Some  muscle wasting and subcutaneous fat loss can likely be attributed to aging process Nutrition Focused Physical Exam:  Subcutaneous Fat:  Orbital Region: moderate Upper Arm Region: severe Thoracic and Lumbar Region:WDL  Muscle:  Temple Region: WDL Clavicle Bone Region: WDL Clavicle and Acromion Bone Region: moderate Scapular Bone Region: moderate Dorsal Hand: moderate Patellar Region: WDL Anterior Thigh Region: severe Posterior Calf Region: moderate  Edema: none noted    Height: Ht Readings from Last 1 Encounters:  04/03/14 5\' 2"  (1.575 m)    Weight: Wt Readings from Last 1 Encounters:  04/03/14 102 lb 4.7 oz (46.4 kg)    Ideal Body Weight: 110 lbs  % Ideal Body Weight: 93%  Wt Readings from Last 10 Encounters:  04/03/14 102 lb 4.7 oz (46.4 kg)  07/29/13 105 lb (47.628 kg)  07/28/13 105 lb (47.628 kg)  05/27/13 114 lb 13.8 oz (52.1 kg)  03/31/13 127 lb (57.607 kg)  02/22/13 134 lb 0.6 oz (60.8 kg)  12/05/12 142 lb 10.2 oz (64.7 kg)  08/04/12 132 lb (59.875 kg)  08/04/12 132 lb (59.875 kg)  07/30/12 132 lb 11.2 oz (60.192 kg)    Usual Body Weight: 130 lbs  % Usual Body Weight: 78%  BMI:  Body mass index is 18.7 kg/(m^2).  Estimated Nutritional Needs: Kcal: 1400-1600 Protein: 50-60 gram Fluid: >/=1500 ml/daily  Skin: no edema, tenting  Diet Order: Cardiac  EDUCATION NEEDS: -No education needs identified at this time   Intake/Output Summary (Last 24 hours) at 04/04/14 1408 Last data filed at 04/04/14 0450  Gross per 24 hour  Intake    810 ml  Output    300 ml  Net    510 ml    Last BM: pta   Labs:   Recent Labs Lab 04/03/14 1745 04/03/14 2240 04/04/14 0330  NA 140 137 139  K 4.3 4.1 4.4  CL 100 99 101  CO2 26 26 27   BUN 29* 27* 25*  CREATININE 1.01 1.00 1.05  CALCIUM 9.5 8.5 8.4  MG  --  1.6  --   PHOS  --  3.0  --   GLUCOSE 100* 138* 86    CBG (last 3)   Recent Labs  04/04/14 0832  GLUCAP 88    Scheduled Meds: .  [START ON 04/05/2014] levofloxacin (LEVAQUIN) IV  750 mg Intravenous Q48H  . loratadine  10 mg Oral Daily  . losartan  50 mg Oral Daily  . metFORMIN  500 mg Oral BID WC  . mirabegron ER  25 mg Oral q morning - 10a  . mirtazapine  7.5 mg Oral QHS  . pantoprazole  40 mg Oral Daily  . rivaroxaban  10 mg Oral Daily  . ropinirole  5 mg Oral QHS  . senna-docusate  2 tablet Oral q morning - 10a    Continuous Infusions: . sodium chloride 100 mL/hr at 04/03/14 2351    Past Medical History  Diagnosis Date  . Cyst and pseudocyst of pancreas   . Unspecified essential hypertension   . Other and unspecified hyperlipidemia   . Polymyalgia rheumatica   . Personal history of unspecified digestive disease   . Restless legs syndrome (RLS)   . Insomnia, unspecified   . Other malaise and fatigue   . Diverticulosis of colon (without mention of hemorrhage)   . Acute gastritis without mention of hemorrhage   . Duodenitis without mention of hemorrhage   . Diaphragmatic hernia without mention of obstruction or gangrene   . Esophageal reflux   . Stricture and stenosis of esophagus   . OAB (overactive bladder)   . Left leg DVT jan 2013  . Unspecified sleep apnea     hx of sleep apnea, none since weight loss  . Diabetes mellitus     Past Surgical History  Procedure Laterality Date  . Oophorectomy  1944    not sure which ovary  . Rotator cuff repair  right     4-5 yrs ago  . Eye surgery      cataracts removed-bilateral eyes  . Back surgery  2009    lower back  . Colon surgery  June 17, 2012    surgery for bleed  . Shoulder open rotator cuff repair  08/04/2012    Procedure: ROTATOR CUFF REPAIR SHOULDER OPEN;  Surgeon: Tobi Bastos, MD;  Location: [facility];  Service: Orthopedics;  Laterality: Left;  with Anchors and Graft   Atlee Abide MS RD LDN Clinical Dietitian [phone]

## 2014-04-04 NOTE — Progress Notes (Addendum)
[facility] Progress Note   SHATISHA FALTER YQM:[id number] DOB: 08/05/1919 DOA: 04/03/2014 PCP: Benito Mccreedy, MD  Brief narrative: [name] is a 80 y.o. female presenting on 04/03/2014 presenting with 7 weeks of cough treated with Doxycyline as outpt- CXR as outpt have been negative but in the ER CXr was suggestive of pneumonia.    Subjective: Feeling better from yesterday- less cough.   Assessment/Plan: Principal Problem:   CAP - cont Levaquin- pt improving  Active Problems: Dehydration - due to poor PO intake for weeks - cont IVF  Hypothyroid - will start 25 mcg of Levothyroxine  Anemia - Hgb dropping steadily - follow- check stool occults and anemia panel in AM    RESTLESS LEG SYNDROME - cont home meds    HYPERTENSION - cont Losartan    DVT of leg (deep venous thrombosis) - cont Xarelto    Malnutrition of moderate degree - add Ensure    Code Status: Full code Family Communication: none Disposition Plan: to be determined after PT eval  Consultants: none  Procedures: none  Antibiotics: Antibiotics Given (last 72 hours)   None       DVT prophylaxis: Xarelto  Objective: Filed Weights   04/03/14 2118  Weight: 46.4 kg (102 lb 4.7 oz)   Blood pressure 116/62, pulse 68, temperature 97.4 F (36.3 C), temperature source Oral, resp. rate 16, height 5' 2"  (1.575 m), weight 46.4 kg (102 lb 4.7 oz), SpO2 93.00%.  Intake/Output Summary (Last 24 hours) at 04/04/14 1615 Last data filed at 04/04/14 0450  Gross per 24 hour  Intake    810 ml  Output    300 ml  Net    510 ml     Exam: General: elderly frail female laying in bed coughing Lungs: Clear to auscultation bilaterally without wheezes or crackles Cardiovascular: Regular rate and rhythm without murmur gallop or rub normal S1 and S2 Abdomen: Nontender, nondistended, soft, bowel sounds positive, no rebound, no ascites, no appreciable mass Extremities: No significant cyanosis,  clubbing, or edema bilateral lower extremities  Data Reviewed: Basic Metabolic Panel:  Recent Labs Lab 04/03/14 1745 04/03/14 2240 04/04/14 0330  NA 140 137 139  K 4.3 4.1 4.4  CL 100 99 101  CO2 26 26 27   GLUCOSE 100* 138* 86  BUN 29* 27* 25*  CREATININE 1.01 1.00 1.05  CALCIUM 9.5 8.5 8.4  MG  --  1.6  --   PHOS  --  3.0  --    Liver Function Tests:  Recent Labs Lab 04/03/14 2240 04/04/14 0330  AST 18 17  ALT 9 9  ALKPHOS 85 83  BILITOT <0.2* <0.2*  PROT 5.4* 5.5*  ALBUMIN 2.4* 2.4*   No results found for this basename: LIPASE, AMYLASE,  in the last 168 hours No results found for this basename: AMMONIA,  in the last 168 hours CBC:  Recent Labs Lab 04/03/14 1745 04/03/14 2240 04/04/14 0330  WBC 8.7 7.2 5.4  NEUTROABS 6.0 5.1  --   HGB 10.0* 9.1* 8.6*  HCT 30.9* 27.2* 27.3*  MCV 92.0 91.0 92.5  PLT 434* 332 350   Cardiac Enzymes: No results found for this basename: CKTOTAL, CKMB, CKMBINDEX, TROPONINI,  in the last 168 hours BNP (last 3 results) No results found for this basename: PROBNP,  in the last 8760 hours CBG:  Recent Labs Lab 04/04/14 0832  GLUCAP 88    Recent Results (from the past 240 hour(s))  CULTURE, EXPECTORATED SPUTUM-ASSESSMENT  Status: None   Collection Time    04/04/14  2:26 PM      Result Value Ref Range Status   Specimen Description SPUTUM   Final   Special Requests NONE   Final   Sputum evaluation     Final   Value: MICROSCOPIC FINDINGS SUGGEST THAT THIS SPECIMEN IS NOT REPRESENTATIVE OF LOWER RESPIRATORY SECRETIONS. PLEASE RECOLLECT.     RESULTS CALLED TO, READ BACK BY AND VERIFIED WITH [facility] [id number] @ [phone] BY J SCOTTON   Report Status 04/04/2014 FINAL   Final     Studies:  Recent x-ray studies have been reviewed in detail by the Attending Physician  Scheduled Meds:  Scheduled Meds: . feeding supplement (GLUCERNA SHAKE)  237 mL Oral QID  . [START ON 04/05/2014] levofloxacin (LEVAQUIN) IV  750 mg  Intravenous Q48H  . [START ON 04/05/2014] levothyroxine  25 mcg Oral QAC breakfast  . loratadine  10 mg Oral Daily  . losartan  50 mg Oral Daily  . metFORMIN  500 mg Oral BID WC  . mirabegron ER  25 mg Oral q morning - 10a  . mirtazapine  7.5 mg Oral QHS  . pantoprazole  40 mg Oral Daily  . rivaroxaban  10 mg Oral Daily  . ropinirole  5 mg Oral QHS  . senna-docusate  2 tablet Oral q morning - 10a   Continuous Infusions: . sodium chloride 100 mL/hr at 04/03/14 2351    Time spent on care of this patient: 35 min   Debbe Odea, MD 04/04/2014, 4:15 PM  LOS: 1 day   [facility] Office  [phone] Pager - Text Page per Shea Evans   If 7PM-7AM, please contact night-coverage [email or web address]

## 2014-04-04 NOTE — Progress Notes (Signed)
CARE MANAGEMENT NOTE 04/04/2014  Patient:  [name], [name]   Account Number:  [id number]  Date Initiated:  04/04/2014  Documentation initiated by:  Chez Bulnes  Subjective/Objective Assessment:   pt with cough, and yellowsputum, cxr-poss pna, age 80     Action/Plan:   lives along does have hha-agency???/will follow for needs   Anticipated DC Date:  04/07/2014   Anticipated DC Plan:  [place] referral  NA      DC Planning Services  CM consult      Choice offered to / List presented to:             Status of service:  In process, will continue to follow Medicare Important Message given?   (If response is "NO", the following Medicare IM given date fields will be blank) Date Medicare IM given:   Date Additional Medicare IM given:    Discharge Disposition:    Per UR Regulation:  Reviewed for med. necessity/level of care/duration of stay  If discussed at [place] of Stay Meetings, dates discussed:    Comments:  [id number]/[name], [name], [place] [phone] Chart Reviewed for discharge and hospital needs. Discharge needs at time of review: None present will follow for needs. Review of patient progress due on [id number].

## 2014-04-05 ENCOUNTER — Inpatient Hospital Stay (HOSPITAL_COMMUNITY): Payer: Medicare Other

## 2014-04-05 DIAGNOSIS — J9819 Other pulmonary collapse: Secondary | ICD-10-CM | POA: Diagnosis not present

## 2014-04-05 DIAGNOSIS — E440 Moderate protein-calorie malnutrition: Secondary | ICD-10-CM | POA: Diagnosis not present

## 2014-04-05 DIAGNOSIS — J189 Pneumonia, unspecified organism: Secondary | ICD-10-CM | POA: Diagnosis not present

## 2014-04-05 DIAGNOSIS — I82409 Acute embolism and thrombosis of unspecified deep veins of unspecified lower extremity: Secondary | ICD-10-CM | POA: Diagnosis not present

## 2014-04-05 DIAGNOSIS — D638 Anemia in other chronic diseases classified elsewhere: Secondary | ICD-10-CM | POA: Diagnosis not present

## 2014-04-05 LAB — URINE CULTURE
COLONY COUNT: NO GROWTH
Culture: NO GROWTH

## 2014-04-05 LAB — RETICULOCYTES
RBC.: 3.08 MIL/uL — AB (ref 3.87–5.11)
RETIC COUNT ABSOLUTE: 61.6 10*3/uL (ref 19.0–186.0)
Retic Ct Pct: 2 % (ref 0.4–3.1)

## 2014-04-05 LAB — GLUCOSE, CAPILLARY: Glucose-Capillary: 71 mg/dL (ref 70–99)

## 2014-04-05 LAB — FERRITIN: Ferritin: 70 ng/mL (ref 10–291)

## 2014-04-05 LAB — IRON AND TIBC
IRON: 26 ug/dL — AB (ref 42–135)
Saturation Ratios: 15 % — ABNORMAL LOW (ref 20–55)
TIBC: 168 ug/dL — ABNORMAL LOW (ref 250–470)
UIBC: 142 ug/dL (ref 125–400)

## 2014-04-05 LAB — VITAMIN B12

## 2014-04-05 LAB — FOLATE: Folate: >20 ng/mL

## 2014-04-05 MED ORDER — ACETAMINOPHEN 650 MG RE SUPP
650.0000 mg | Freq: Four times a day (QID) | RECTAL | Status: DC | PRN
Start: 2014-04-05 — End: 2014-04-06

## 2014-04-05 MED ORDER — ENSURE COMPLETE PO LIQD
237.0000 mL | Freq: Three times a day (TID) | ORAL | Status: DC
  Administered 2014-04-06 (×2): 237 mL via ORAL

## 2014-04-05 MED ORDER — ACETAMINOPHEN 325 MG PO TABS: 650.0000 mg | ORAL_TABLET | ORAL | Status: DC | PRN

## 2014-04-05 NOTE — Progress Notes (Signed)
[facility] PROGRESS NOTE  [name] [id number] DOB: Apr 21, 1919 DOA: 04/03/2014 PCP: Benito Mccreedy, MD  Brief narrative: 80 year old female with past medical history of DVT on anticoagulation with xarelto, hypertension, diabetes, overactive bladder, GERD, duodenitis who presented to [facility] ED 04/03/2014 with worsening shortness of breath and cough for past few weeks and getting worse over few days prior to this admission. She was apparently on vibramycin for bronchitis outpatient but no significant improvement noted. In ED, vitals are stable with BP of 109/62, HR 77, oxygen saturation of 90 % on room air. Blood work showed hemoglobin of 10 otherwise unremarkable. CXR showed possible left lower lobe pneumonia and she was given azithromycin and rocephin in ED. She was admitted for further evaluation and management of pneumonia.   Assessment and Plan:   Principal Problem:  CAP (community acquired pneumonia), Acute respiratory failure with hypoxia  - based on admission CXR pneumonia in left lung base; we will repeat CXR today to ensure resolution of pneumonia - blood culture, resp culture, legionella, influenza and strep pneumonia all negative - continue Levaquin for now - oxygen support via nasal canula to keep O2 saturation above 90%  Active Problems:  HYPERTENSION  - continue losartan  - normal renal function  Hypothyroidism - TSH is more than 100 on this admission and Free T4 0.36; troponin on admission WNL - no complaints of chest pain; no bradycardia - pt started on low dose synthroid and her TSH will be repeated perhaps prior to this discharge and then again in an outpatient setting GERD  - continue protoix  INSOMNIA  - continue remeron  Overactive bladder  - continue myrbetriq  DVT of leg (deep venous thrombosis)  - continue xarelto  Anemia of chronic disease  - secondary to history of anticoagulation  - hemoglobin drop noted from 10 to 8.6  - repeat CBC in  am History of diabetes  - A1c is 6.6 on this admission indicating good glycemic control RESTLESS LEG SYNDROME  - continue requip    Code Status: full code Family Communication: family not at the bedside  Disposition Plan: per PT, requires SNF  Robbie Lis, MD  [facility] Pager [phone]  If 7PM-7AM, please contact night-coverage [email or web address] Password [email or web address] 04/05/2014, 3:57 PM   LOS: 2 days   Consultants:  None  Procedures:  None   Antibiotics:  Levaquin 04/03/2014 -->  HPI/Subjective: No overnight events.   Objective: Filed Vitals:   04/04/14 1400 04/04/14 2215 04/05/14 0632 04/05/14 1352  BP: 130/66 129/71 150/64 120/57  Pulse: 78 84 58 62  Temp: 97.6 F (36.4 C) 98.3 F (36.8 C) 97.4 F (36.3 C) 98.5 F (36.9 C)  TempSrc: Oral Oral Oral Oral  Resp: 16 16 16 16   Height:      Weight:   47.6 kg (104 lb 15 oz)   SpO2: 93% 91% 95% 94%    Intake/Output Summary (Last 24 hours) at 04/05/14 1557 Last data filed at 04/05/14 1352  Gross per 24 hour  Intake   1050 ml  Output    100 ml  Net    950 ml    Exam:   General:  Pt is alert, follows commands appropriately, not in acute distress  Cardiovascular: Regular rate and rhythm, S1/S2 appreciated   Respiratory: Clear to auscultation bilaterally, no wheezing, no crackles, no rhonchi  Abdomen: Soft, non tender, non distended, bowel sounds present, no guarding  Extremities: No edema, pulses DP and PT palpable bilaterally  Neuro:  Grossly nonfocal  Data Reviewed: Basic Metabolic Panel:  Recent Labs Lab 04/03/14 1745 04/03/14 2240 04/04/14 0330  NA 140 137 139  K 4.3 4.1 4.4  CL 100 99 101  CO2 26 26 27   GLUCOSE 100* 138* 86  BUN 29* 27* 25*  CREATININE 1.01 1.00 1.05  CALCIUM 9.5 8.5 8.4  MG  --  1.6  --   PHOS  --  3.0  --    Liver Function Tests:  Recent Labs Lab 04/03/14 2240 04/04/14 0330  AST 18 17  ALT 9 9  ALKPHOS 85 83  BILITOT <0.2* <0.2*  PROT 5.4* 5.5*   ALBUMIN 2.4* 2.4*   No results found for this basename: LIPASE, AMYLASE,  in the last 168 hours No results found for this basename: AMMONIA,  in the last 168 hours CBC:  Recent Labs Lab 04/03/14 1745 04/03/14 2240 04/04/14 0330  WBC 8.7 7.2 5.4  NEUTROABS 6.0 5.1  --   HGB 10.0* 9.1* 8.6*  HCT 30.9* 27.2* 27.3*  MCV 92.0 91.0 92.5  PLT 434* 332 350   Cardiac Enzymes: No results found for this basename: CKTOTAL, CKMB, CKMBINDEX, TROPONINI,  in the last 168 hours BNP: No components found with this basename: POCBNP,  CBG:  Recent Labs Lab 04/04/14 0832 04/05/14 0749  GLUCAP 88 71    Recent Results (from the past 240 hour(s))  URINE CULTURE     Status: None   Collection Time    04/03/14  5:28 PM      Result Value Ref Range Status   Specimen Description URINE, CLEAN CATCH   Final   Special Requests NONE   Final   Culture  Setup Time     Final   Value: 04/04/2014 01:15     Performed at [place]     Final   Value: NO GROWTH     Performed at [facility]   Culture     Final   Value: NO GROWTH     Performed at [facility]   Report Status 04/05/2014 FINAL   Final  CULTURE, BLOOD (ROUTINE X 2)     Status: None   Collection Time    04/03/14  5:45 PM      Result Value Ref Range Status   Specimen Description BLOOD LEFT ARM   Final   Special Requests BOTTLES DRAWN AEROBIC AND ANAEROBIC 3CC   Final   Culture  Setup Time     Final   Value: 04/04/2014 00:18     Performed at [facility]   Culture     Final   Value:        BLOOD CULTURE RECEIVED NO GROWTH TO DATE CULTURE WILL BE HELD FOR 5 DAYS BEFORE ISSUING A FINAL NEGATIVE REPORT     Performed at [facility]   Report Status PENDING   Incomplete  CULTURE, BLOOD (ROUTINE X 2)     Status: None   Collection Time    04/03/14  6:15 PM      Result Value Ref Range Status   Specimen Description BLOOD LEFT HAND   Final   Special Requests BOTTLES DRAWN AEROBIC AND  ANAEROBIC 3ML   Final   Culture  Setup Time     Final   Value: 04/04/2014 00:19     Performed at [facility]   Culture     Final   Value:  BLOOD CULTURE RECEIVED NO GROWTH TO DATE CULTURE WILL BE HELD FOR 5 DAYS BEFORE ISSUING A FINAL NEGATIVE REPORT     Performed at [facility]   Report Status PENDING   Incomplete  CULTURE, EXPECTORATED SPUTUM-ASSESSMENT     Status: None   Collection Time    04/04/14  2:26 PM      Result Value Ref Range Status   Specimen Description SPUTUM   Final   Special Requests NONE   Final   Sputum evaluation     Final   Value: MICROSCOPIC FINDINGS SUGGEST THAT THIS SPECIMEN IS NOT REPRESENTATIVE OF LOWER RESPIRATORY SECRETIONS. PLEASE RECOLLECT.     RESULTS CALLED TO, READ BACK BY AND VERIFIED WITH CAROLYN [facility] [id number] @ [id number] BY J SCOTTON   Report Status 04/04/2014 FINAL   Final     Studies: Dg Chest 2 View 04/03/2014  IMPRESSION: Patchy densities in the lungs, predominantly at the left lung base. Some of these densities were present on the previous examination. Findings are concerning for an infectious etiology. Recommend follow up to ensure resolution.  Stable hyperinflation.      Scheduled Meds: . feeding supplement (ENSURE COMPLETE)  237 mL Oral TID WC  . levofloxacin (LEVAQUIN)   750 mg Intravenous Q48H  . levothyroxine  25 mcg Oral QAC breakfast  . loratadine  10 mg Oral Daily  . losartan  50 mg Oral Daily  . metFORMIN  500 mg Oral BID WC  . mirabegron ER  25 mg Oral q morning - 10a  . mirtazapine  7.5 mg Oral QHS  . pantoprazole  40 mg Oral Daily  . rivaroxaban  10 mg Oral Daily  . ropinirole  5 mg Oral QHS  . senna-docusate  2 tablet Oral q morning - 10a   Continuous Infusions: . sodium chloride 100 mL/hr (04/05/14 1413)

## 2014-04-05 NOTE — Evaluation (Signed)
Physical Therapy Evaluation Patient Details Name: [name] MRN: [id number] DOB: May 05, 1919 Today's Date: 04/05/2014   History of Present Illness  80 year old female with past medical history of DVT on anticoagulation with xarelto, hypertension, diabetes, overactive bladder, GERD, duodenitis who presented to [facility] ED 04/03/2014 with worsening shortness of breath and cough for past few weeks and getting worse over few days prior to this admissionDiagnosis of pneumonia  Clinical Impression  Pt tolerated ambulating with RW. Pt reports  She has  Caregivers only 3 days a week. Pt will benefit from PT to address problems listed. Pt would benefit from 24/7 caregivers initially.   Follow Up Recommendations SNF;Supervision/Assistance - 24 hour (vs. HHPT if pt has 24/7 cargivers initially.)    Equipment Recommendations  None recommended by PT    Recommendations for Other Services   OT    Precautions / Restrictions Precautions Precautions: Fall Precaution Comments: monitor sats      Mobility  Bed Mobility Overal bed mobility: Needs Assistance Bed Mobility: Supine to Sit     Supine to sit: Supervision     General bed mobility comments: no assist required,  Transfers Overall transfer level: Needs assistance Equipment used: Rolling walker (2 wheeled) Transfers: Sit to/from Stand Sit to Stand: Supervision         General transfer comment: from bed and from toilet.  Ambulation/Gait Ambulation/Gait assistance: Min guard Ambulation Distance (Feet): 120 Feet Assistive device: Rolling walker (2 wheeled)   Gait velocity: decr.   General Gait Details: stopped x 1 for rest while standing  w/ c/o legs starting to ache.  Stairs            Wheelchair Mobility    Modified Rankin (Stroke Patients Only)       Balance Overall balance assessment: Needs assistance Sitting-balance support: No upper extremity supported;Feet supported Sitting balance-Leahy Scale: Good      Standing balance support: No upper extremity supported;During functional activity Standing balance-Leahy Scale: Fair                               Pertinent Vitals/Pain sats 92% RA  After return from ambulation. . After rest 96%    Home Living Family/patient expects to be discharged to:: Private residence   Available Help at Discharge: Other (Comment) (cargiver 3 days a week) Type of Home:  (condo) Home Access: Stairs to enter Entrance Stairs-Rails: Can reach both Entrance Stairs-Number of Steps: 5 Home Layout: One level Home Equipment: Grab bars - tub/shower;Walker - 2 wheels;Walker - 4 wheels;Cane - quad Additional Comments: hired caregivers  MWF for a few hours    Prior Function Level of Independence: Independent with assistive device(s)         Comments: cargiver prepares meals, supervises shower.     Hand Dominance        Extremity/Trunk Assessment   Upper Extremity Assessment: Generalized weakness           Lower Extremity Assessment: Generalized weakness      Cervical / Trunk Assessment: Kyphotic  Communication      Cognition Arousal/Alertness: Awake/alert Behavior During Therapy: WFL for tasks assessed/performed Overall Cognitive Status: Within Functional Limits for tasks assessed                      General Comments      Exercises        Assessment/Plan    PT Assessment Patient needs  continued PT services  PT Diagnosis Difficulty walking;Generalized weakness   PT Problem List Decreased strength;Decreased mobility;Decreased safety awareness;Cardiopulmonary status limiting activity  PT Treatment Interventions DME instruction;Gait training;Functional mobility training;Stair training;Therapeutic activities;Therapeutic exercise;Patient/family education   PT Goals (Current goals can be found in the Care Plan section) Acute Rehab PT Goals Patient Stated Goal: I want to go home if i can. PT Goal Formulation: With  patient Time For Goal Achievement: 04/19/14 Potential to Achieve Goals: Good    Frequency Min 3X/week   Barriers to discharge Decreased caregiver support      Co-evaluation               End of Session Equipment Utilized During Treatment: Gait belt Activity Tolerance: Patient tolerated treatment well Patient left: in chair;with call bell/phone within reach;with chair alarm set Nurse Communication: Mobility status         Time: 1430-1452 PT Time Calculation (min): 22 min   Charges:   PT Evaluation $Initial PT Evaluation Tier I: 1 Procedure PT Treatments $Gait Training: 8-22 mins   PT G Codes:          Claretha Cooper 04/05/2014, 3:02 PM Tresa Endo PT [phone]

## 2014-04-05 NOTE — Progress Notes (Addendum)
Clinical Social Work Department CLINICAL SOCIAL WORK PLACEMENT NOTE 04/05/2014  Patient:  GLENEVA, LEGROS  Account Number:  [id number] Admit date:  04/03/2014  Clinical Social Worker:  Ulyess Blossom  Date/time:  04/05/2014 04:12 PM  Clinical Social Work is seeking post-discharge placement for this patient at the following level of care:   SKILLED NURSING   (*CSW will update this form in Epic as items are completed)   04/05/2014  Patient/family provided with [place] Department of Clinical Social Work's list of facilities offering this level of care within the geographic area requested by the patient (or if unable, by the patient's family).  04/05/2014  Patient/family informed of their freedom to choose among providers that offer the needed level of care, that participate in Medicare, Medicaid or managed care program needed by the patient, have an available bed and are willing to accept the patient.  04/05/2014  Patient/family informed of [facility]' ownership interest in [facility], as well as of the fact that they are under no obligation to receive care at this facility.  PASARR submitted to EDS on 04/05/2014 PASARR number received from EDS on 04/05/2014  FL2 transmitted to all facilities in geographic area requested by pt/family on  04/05/2014 FL2 transmitted to all facilities within larger geographic area on   Patient informed that his/her managed care company has contracts with or will negotiate with  certain facilities, including the following:     Patient/family informed of bed offers received:  04/06/2014 Patient chooses bed at [facility] Physician recommends and patient chooses bed at    Patient to be transferred to  on  [facility] on 04/06/2014 Patient to be transferred to facility by pt son via private vehicle  The following physician request were entered in Epic:   Additional Comments:    Alison Murray, MSW, [place]  Work [phone]

## 2014-04-05 NOTE — Progress Notes (Signed)
Clinical Social Work Department BRIEF PSYCHOSOCIAL ASSESSMENT 04/05/2014  Patient:  [name], [name]     Account Number:  [id number]     Admit date:  04/03/2014  Clinical Social Worker:  Ulyess Blossom  Date/Time:  04/05/2014 04:00 PM  Referred by:  Physician  Date Referred:  04/05/2014 Referred for  SNF Placement   Other Referral:   Interview type:  Patient Other interview type:    PSYCHOSOCIAL DATA Living Status:  ALONE Admitted from facility:   Level of care:   Primary support name:  Gene Spallone/son/[phone] Primary support relationship to patient:  CHILD, ADULT Degree of support available:   pt reports adequate support    CURRENT CONCERNS Current Concerns  Post-Acute Placement   Other Concerns:    SOCIAL WORK ASSESSMENT / PLAN CSW received referral for New SNF.    CSW met with pt at bedside. CSW introduced self and explained role. Pt discussed that she lives alone, but has a caregiver that comes to assist 3 days a week. Pt reports that that caregiver can come more often, but pt would still not have 24 hour care. CSW discussed with pt that pt may benefit from a short stay at rehab. Pt discussed that she recognizes that she has gotten weaker within the past week as she has not been feeling well. Pt expressed that she'd be open to short term rehab if that is the recommendation, but wants to be able to get back home by the end of May as she is expecting company during that time. CSW expressed understanding and recommended to see how pt does remainder of hospitalization, but to initiate SNF search in order to have options available. Pt agreeable to this plan and is agreeable to rehab at SNF if continues to be recommendation by medical staff. Pt reports that she has been to rehab at [facility] before and thinks that she was at [facility] and was pleased at that facility. Pt agreeable to [facility] search.    CSW completed FL2 and initiated SNF search to [facility]. CSW  contacted [facility] and notified facility of pt interest.    CSW to follow up with pt tomorrow re: SNF bed offers and to continue to discuss disposition planning.    CSW to continue to follow.   Assessment/plan status:  Psychosocial Support/Ongoing Assessment of Needs Other assessment/ plan:   discharge planning   Information/referral to community resources:   [facility] list    PATIENT'S/FAMILY'S RESPONSE TO PLAN OF CARE: Pt alert and oriented x 4. Pt very pleasant and is eager to begin to feel better. Pt would prefer to return home, but is agreeable to recommendation for short term SNF if she continues to need assistance while in the hospital. Support provided.    Alison Murray, MSW, [place] Work [phone]

## 2014-04-06 DIAGNOSIS — J189 Pneumonia, unspecified organism: Secondary | ICD-10-CM | POA: Diagnosis not present

## 2014-04-06 DIAGNOSIS — I10 Essential (primary) hypertension: Secondary | ICD-10-CM | POA: Diagnosis not present

## 2014-04-06 DIAGNOSIS — E039 Hypothyroidism, unspecified: Secondary | ICD-10-CM | POA: Diagnosis not present

## 2014-04-06 DIAGNOSIS — E119 Type 2 diabetes mellitus without complications: Secondary | ICD-10-CM | POA: Diagnosis not present

## 2014-04-06 DIAGNOSIS — D638 Anemia in other chronic diseases classified elsewhere: Secondary | ICD-10-CM | POA: Diagnosis not present

## 2014-04-06 DIAGNOSIS — G2581 Restless legs syndrome: Secondary | ICD-10-CM | POA: Diagnosis not present

## 2014-04-06 DIAGNOSIS — G4700 Insomnia, unspecified: Secondary | ICD-10-CM | POA: Diagnosis not present

## 2014-04-06 DIAGNOSIS — K222 Esophageal obstruction: Secondary | ICD-10-CM | POA: Diagnosis not present

## 2014-04-06 DIAGNOSIS — N318 Other neuromuscular dysfunction of bladder: Secondary | ICD-10-CM | POA: Diagnosis not present

## 2014-04-06 DIAGNOSIS — M6281 Muscle weakness (generalized): Secondary | ICD-10-CM | POA: Diagnosis not present

## 2014-04-06 DIAGNOSIS — E440 Moderate protein-calorie malnutrition: Secondary | ICD-10-CM | POA: Diagnosis not present

## 2014-04-06 DIAGNOSIS — R269 Unspecified abnormalities of gait and mobility: Secondary | ICD-10-CM | POA: Diagnosis not present

## 2014-04-06 DIAGNOSIS — I82409 Acute embolism and thrombosis of unspecified deep veins of unspecified lower extremity: Secondary | ICD-10-CM | POA: Diagnosis not present

## 2014-04-06 DIAGNOSIS — K219 Gastro-esophageal reflux disease without esophagitis: Secondary | ICD-10-CM | POA: Diagnosis not present

## 2014-04-06 LAB — GLUCOSE, CAPILLARY: Glucose-Capillary: 73 mg/dL (ref 70–99)

## 2014-04-06 MED ORDER — ENSURE COMPLETE PO LIQD: 237.0000 mL | Freq: Three times a day (TID) | ORAL | Status: DC

## 2014-04-06 MED ORDER — HYDROCOD POLST-CHLORPHEN POLST 10-8 MG/5ML PO LQCR: 5.0000 mL | Freq: Two times a day (BID) | ORAL | Status: DC

## 2014-04-06 MED ORDER — METFORMIN HCL 500 MG PO TABS: 500.0000 mg | ORAL_TABLET | Freq: Every day | ORAL | Status: DC

## 2014-04-06 MED ORDER — HYDROCOD POLST-CHLORPHEN POLST 10-8 MG/5ML PO LQCR
5.0000 mL | Freq: Two times a day (BID) | ORAL | Status: DC
  Administered 2014-04-06: 5 mL via ORAL
  Filled 2014-04-06: qty 5

## 2014-04-06 MED ORDER — ACETAMINOPHEN 325 MG PO TABS: 650.0000 mg | ORAL_TABLET | ORAL | Status: DC | PRN

## 2014-04-06 MED ORDER — POLYETHYLENE GLYCOL 3350 17 G PO PACK: 17.0000 g | PACK | Freq: Every day | ORAL | Status: DC | PRN

## 2014-04-06 MED ORDER — OXYCODONE HCL 5 MG PO TABS: 10.0000 mg | ORAL_TABLET | Freq: Four times a day (QID) | ORAL | Status: DC | PRN

## 2014-04-06 MED ORDER — PANTOPRAZOLE SODIUM 40 MG PO TBEC: 40.0000 mg | DELAYED_RELEASE_TABLET | Freq: Every day | ORAL | Status: DC

## 2014-04-06 MED ORDER — LEVOTHYROXINE SODIUM 25 MCG PO TABS: 25.0000 ug | ORAL_TABLET | Freq: Every day | ORAL | Status: DC

## 2014-04-06 MED ORDER — ONDANSETRON HCL 4 MG PO TABS: 4.0000 mg | ORAL_TABLET | Freq: Four times a day (QID) | ORAL | Status: DC | PRN

## 2014-04-06 MED ORDER — LEVOFLOXACIN 750 MG PO TABS: 750.0000 mg | ORAL_TABLET | Freq: Every day | ORAL | Status: DC

## 2014-04-06 NOTE — Progress Notes (Signed)
CSW met with pt regarding SNF bed offers.   CSW discussed SNF bed offers. Pt chooses a bed at [facility].   CSW contacted [facility] who confirmed bed availability for today. Per MD, pt medically stable for discharge today.   Pt reports that pt son will transport via private vehicle. CSW will contact pt soon shortly to arrange plans for transport to [facility] this afternoon.  CSW to continue to follow.  Alison Murray, MSW, [place] Work [phone]

## 2014-04-06 NOTE — Progress Notes (Signed)
Patient discharged to [facility], son to transport to facility, discharge packet sent with patient. Patient placed in son's vehicle.

## 2014-04-06 NOTE — Discharge Instructions (Signed)

## 2014-04-06 NOTE — Progress Notes (Signed)
Attempted to call report to [facility], left message for supervisor to return call.

## 2014-04-06 NOTE — Progress Notes (Signed)
Pt for discharge to [facility].   CSW facilitated pt discharge needs including contacting facility, faxing pt discharge information via [facility], discussing with pt at bedside, and providing RN phone number to call report. Pt plans to transport via pt son by private vehicle. Pt reports that pt son will arrive to hospital at 3 pm for transport to [facility]. CSW provided discharge packet in [place] for RN to provide to pt and pt son at transport. RN aware.   CSW contacted pt son via telephone and left voice message. [place] aware that pt will be coming via private vehicle.  No further social work needs identified at this time.  CSW signing off.   Alison Murray, MSW, [place] Work [phone]

## 2014-04-06 NOTE — Discharge Summary (Signed)
Physician Discharge Summary  [name] [id number] DOB: 1919-04-07 DOA: 04/03/2014  PCP: Benito Mccreedy, MD  Admit date: 04/03/2014 Discharge date: 04/06/2014  Recommendations for Outpatient Follow-up:  Changed metformin to once a day regimen  Levaquin for 7 more days on discharge for pneumonia  Recheck CBC in 4-5 days to make sure hemoglobin is tsable.  Discharge Diagnoses:  Principal Problem:   CAP (community acquired pneumonia) Active Problems:   RESTLESS LEG SYNDROME   HYPERTENSION   INSOMNIA   Overactive bladder   DVT of leg (deep venous thrombosis)   Anemia of chronic disease   Malnutrition of moderate degree    Discharge Condition: medically stable for discharge today   Diet recommendation: as tolerated   History of present illness:  80 year old female with past medical history of DVT on anticoagulation with xarelto, hypertension, diabetes, overactive bladder, GERD, duodenitis who presented to [facility] ED 04/03/2014 with worsening shortness of breath and cough for past few weeks and getting worse over few days prior to this admission. She was apparently on vibramycin for bronchitis outpatient but no significant improvement noted.  In ED, vitals are stable with BP of 109/62, HR 77, oxygen saturation of 90 % on room air. Blood work showed hemoglobin of 10 otherwise unremarkable. CXR showed possible left lower lobe pneumonia and she was given azithromycin and rocephin in ED. She was admitted for further evaluation and management of pneumonia.   Assessment and Plan:   Principal Problem:  CAP (community acquired pneumonia), Acute respiratory failure with hypoxia  - based on admission CXR pneumonia in left lung base; repeat CXR seems to be stable - blood culture, resp culture, legionella, influenza and strep pneumonia all negative  - continue Levaquin for now for 7 more days on discharge   Active Problems:  HYPERTENSION  - continue losartan  - normal renal function   Hypothyroidism  - TSH is more than 100 on this admission and Free T4 0.36; troponin on admission WNL  - no complaints of chest pain; no bradycardia  - pt started on low dose synthroid and her TSH will be repeated in SNF in 1 month from discharge to make sure TSH trending down  GERD  - continue protoix  INSOMNIA  - continue remeron  Overactive bladder  - continue myrbetriq  DVT of leg (deep venous thrombosis)  - continue xarelto  Anemia of chronic disease  - secondary to history of anticoagulation  - hemoglobin drop noted from 10 to 8.6; no acute bleed History of diabetes  - A1c is 6.6 on this admission indicating good glycemic control  - changed metformin to once a day regimen  RESTLESS LEG SYNDROME  - continue requip   Code Status: full code  Family Communication: family not at the bedside    Consultants:  None Procedures:  None  Antibiotics:  Levaquin 04/03/2014 --> for 7 more days on discharge   Signed:  Robbie Lis, MD  [facility] 04/06/2014, 10:17 AM  Pager #: [phone]   Discharge Exam: Filed Vitals:   04/06/14 0558  BP: 139/71  Pulse: 68  Temp: 98 F (36.7 C)  Resp: 16   Filed Vitals:   04/05/14 0632 04/05/14 1352 04/05/14 2221 04/06/14 0558  BP: 150/64 120/57 144/69 139/71  Pulse: 58 62 84 68  Temp: 97.4 F (36.3 C) 98.5 F (36.9 C) 98.9 F (37.2 C) 98 F (36.7 C)  TempSrc: Oral Oral Oral Oral  Resp: 16 16 16 16   Height:  Weight: 47.6 kg (104 lb 15 oz)   50.1 kg (110 lb 7.2 oz)  SpO2: 95% 94% 96% 94%    General: Pt is alert, follows commands appropriately, not in acute distress Cardiovascular: Regular rate and rhythm, S1/S2 +, no murmurs, no rubs, no gallops Respiratory: some rhonchi in upper lung lobes, no wheezing  Abdominal: Soft, non tender, non distended, bowel sounds +, no guarding Extremities: no edema, no cyanosis, pulses palpable bilaterally DP and PT Neuro: Grossly nonfocal  Discharge Instructions  Discharge  Orders   Future Orders Complete By Expires   Call MD for:  difficulty breathing, headache or visual disturbances  As directed    Call MD for:  persistant dizziness or light-headedness  As directed    Call MD for:  persistant nausea and vomiting  As directed    Call MD for:  severe uncontrolled pain  As directed    Diet - low sodium heart healthy  As directed    Discharge instructions  As directed    Increase activity slowly  As directed        Medication List         acetaminophen 325 MG tablet  Commonly known as:  TYLENOL  Take 2 tablets (650 mg total) by mouth every 4 (four) hours as needed for mild pain, fever or headache (or Fever >/= 101).     cetirizine 10 MG tablet  Commonly known as:  ZYRTEC  Take 10 mg by mouth daily.     chlorpheniramine-HYDROcodone 10-8 MG/5ML Lqcr  Commonly known as:  TUSSIONEX  Take 5 mLs by mouth every 12 (twelve) hours.     cyanocobalamin 1000 MCG/ML injection  Commonly known as:  (VITAMIN B-12)  Inject 1,000 mcg into the muscle every 30 (thirty) days.     feeding supplement (ENSURE COMPLETE) Liqd  Take 237 mLs by mouth 3 (three) times daily with meals.     levofloxacin 750 MG tablet  Commonly known as:  LEVAQUIN  Take 1 tablet (750 mg total) by mouth daily.     levothyroxine 25 MCG tablet  Commonly known as:  SYNTHROID, LEVOTHROID  Take 1 tablet (25 mcg total) by mouth daily before breakfast.     losartan 50 MG tablet  Commonly known as:  COZAAR  Take 50 mg by mouth daily.     metFORMIN 500 MG tablet  Commonly known as:  GLUCOPHAGE  Take 1 tablet (500 mg total) by mouth daily with breakfast.     mirabegron ER 25 MG Tb24 tablet  Commonly known as:  MYRBETRIQ  Take 25 mg by mouth every morning.     mirtazapine 7.5 MG tablet  Commonly known as:  REMERON  Take 7.5 mg by mouth at bedtime.     ondansetron 4 MG tablet  Commonly known as:  ZOFRAN  Take 1 tablet (4 mg total) by mouth every 6 (six) hours as needed for nausea.      oxyCODONE 5 MG immediate release tablet  Commonly known as:  Oxy IR/ROXICODONE  Take 2 tablets (10 mg total) by mouth every 6 (six) hours as needed for moderate pain.     pantoprazole 40 MG tablet  Commonly known as:  PROTONIX  Take 1 tablet (40 mg total) by mouth daily.     polyethylene glycol packet  Commonly known as:  MIRALAX / GLYCOLAX  Take 17 g by mouth daily as needed for moderate constipation.     PRESCRIPTION MEDICATION  Antibiotic shot - name unknown  at Dr. Doristine Section -[facility]     rivaroxaban 10 MG Tabs tablet  Commonly known as:  XARELTO  Take 10 mg by mouth daily.     ropinirole 5 MG tablet  Commonly known as:  REQUIP  Take 5 mg by mouth at bedtime.     senna-docusate 8.6-50 MG per tablet  Commonly known as:  Senokot-S  Take 2 tablets by mouth every morning.     VOLTAREN 1 % Gel  Generic drug:  diclofenac sodium  Apply 2 g topically 2 (two) times daily as needed (for pain). On back           Follow-up Information   Follow up with OSEI-BONSU,GEORGE, MD. Schedule an appointment as soon as possible for a visit in 2 weeks.   Specialty:  Internal Medicine   Contact information:   [place] [id number] [phone]        The results of significant diagnostics from this hospitalization (including imaging, microbiology, ancillary and laboratory) are listed below for reference.    Significant Diagnostic Studies: Dg Chest 2 View  04/03/2014   CLINICAL DATA:  History of bronchitis and cough.  EXAM: CHEST  2 VIEW  COMPARISON:  03/17/2014 and 02/18/2013  FINDINGS: Two views of the chest demonstrate stable hyperinflation. There are persistent patchy densities at the left lung base and questionable patchy densities in the right lower lung. Upper lungs are clear. Heart size is stable. Postsurgical changes in the right shoulder. Retrocardiac density is compatible with a hiatal hernia. Question additional patchy densities in the lateral left lung.   IMPRESSION: Patchy densities in the lungs, predominantly at the left lung base. Some of these densities were present on the previous examination. Findings are concerning for an infectious etiology. Recommend follow up to ensure resolution.  Stable hyperinflation.   Electronically Signed   By: Markus Daft M.D.   On: 04/03/2014 16:30   Dg Chest 2 View  03/17/2014   CLINICAL DATA:  Bronchitis, cough for 4 weeks, history hypertension, polymyalgia rheumatica, diabetes  EXAM: CHEST  2 VIEW  COMPARISON:  05/27/2013  FINDINGS: Normal heart size and pulmonary vascularity.  Tortuous thoracic aorta with atherosclerotic calcification.  Emphysematous and bronchitic changes consistent with COPD.  Hiatal hernia.  Atelectasis versus infiltrate in left lower lobe.  Remaining lungs clear.  No pleural effusion or pneumothorax.  Bones demineralized.  IMPRESSION: COPD changes with atelectasis versus consolidation in left lower lobe.   Electronically Signed   By: Lavonia Dana M.D.   On: 03/17/2014 15:39   US Venous Img Lower Unilateral Left  03/14/2014   CLINICAL DATA:  Left lower extremity pain and edema. History of prior DVT and pulmonary embolism.  EXAM: LEFT LOWER EXTREMITY VENOUS DOPPLER ULTRASOUND  TECHNIQUE: Gray-scale sonography with graded compression, as well as color Doppler and duplex ultrasound, were performed to evaluate the deep venous system from the level of the common femoral vein through the popliteal and proximal calf veins. Spectral Doppler was utilized to evaluate flow at rest and with distal augmentation maneuvers.  COMPARISON:  None.  FINDINGS: Thrombus within deep veins: The femoral vein in the distal thigh and the popliteal vein demonstrate some nonocclusive mural thrombus and intraluminal web formation consistent with chronic thrombus and prior DVT. No acute appearing thrombus is identified.  Compressibility of deep veins:  Normal.  Duplex waveform respiratory phasicity:  Normal.  Duplex waveform response to  augmentation:  Normal.  Venous reflux:  None visualized.  Other findings:  No evidence of superficial thrombophlebitis or abnormal fluid collection.  IMPRESSION: Chronic appearing changes of the distal femoral vein and popliteal vein consistent with prior DVT. No acute DVT is identified.   Electronically Signed   By: Aletta Edouard M.D.   On: 03/14/2014 14:32   Dg Chest Port 1 View  04/05/2014   CLINICAL DATA:  Followup pneumonia.  EXAM: PORTABLE CHEST - 1 VIEW  COMPARISON:  04/03/2014.  FINDINGS: The cardiac silhouette, mediastinal and hilar contours are stable. There is tortuosity and calcification of the thoracic aorta. There are multiple skin folds overlying the chest and the examination is limited by the patient's arms being down by the side. There is bibasilar atelectasis, left greater than right. No edema or pneumothorax.  IMPRESSION: Limited examination.  Bibasilar atelectasis, left greater than right.   Electronically Signed   By: Kalman Jewels M.D.   On: 04/05/2014 17:07    Microbiology: Recent Results (from the past 240 hour(s))  URINE CULTURE     Status: None   Collection Time    04/03/14  5:28 PM      Result Value Ref Range Status   Specimen Description URINE, CLEAN CATCH   Final   Special Requests NONE   Final   Culture  Setup Time     Final   Value: 04/04/2014 01:15     Performed at [place]     Final   Value: NO GROWTH     Performed at [facility]   Culture     Final   Value: NO GROWTH     Performed at [facility]   Report Status 04/05/2014 FINAL   Final  CULTURE, BLOOD (ROUTINE X 2)     Status: None   Collection Time    04/03/14  5:45 PM      Result Value Ref Range Status   Specimen Description BLOOD LEFT ARM   Final   Special Requests BOTTLES DRAWN AEROBIC AND ANAEROBIC 3CC   Final   Culture  Setup Time     Final   Value: 04/04/2014 00:18     Performed at [facility]   Culture     Final   Value:        BLOOD  CULTURE RECEIVED NO GROWTH TO DATE CULTURE WILL BE HELD FOR 5 DAYS BEFORE ISSUING A FINAL NEGATIVE REPORT     Performed at [facility]   Report Status PENDING   Incomplete  CULTURE, BLOOD (ROUTINE X 2)     Status: None   Collection Time    04/03/14  6:15 PM      Result Value Ref Range Status   Specimen Description BLOOD LEFT HAND   Final   Special Requests BOTTLES DRAWN AEROBIC AND ANAEROBIC 3ML   Final   Culture  Setup Time     Final   Value: 04/04/2014 00:19     Performed at [facility]   Culture     Final   Value:        BLOOD CULTURE RECEIVED NO GROWTH TO DATE CULTURE WILL BE HELD FOR 5 DAYS BEFORE ISSUING A FINAL NEGATIVE REPORT     Performed at [facility]   Report Status PENDING   Incomplete  CULTURE, EXPECTORATED SPUTUM-ASSESSMENT     Status: None   Collection Time    04/04/14  2:26 PM      Result Value Ref Range Status   Specimen  Description SPUTUM   Final   Special Requests NONE   Final   Sputum evaluation     Final   Value: MICROSCOPIC FINDINGS SUGGEST THAT THIS SPECIMEN IS NOT REPRESENTATIVE OF LOWER RESPIRATORY SECRETIONS. PLEASE RECOLLECT.     RESULTS CALLED TO, READ BACK BY AND VERIFIED WITH CAROLYN [facility] [id number] @ 1450 BY J SCOTTON   Report Status 04/04/2014 FINAL   Final     Labs: Basic Metabolic Panel:  Recent Labs Lab 04/03/14 1745 04/03/14 2240 04/04/14 0330  NA 140 137 139  K 4.3 4.1 4.4  CL 100 99 101  CO2 26 26 27   GLUCOSE 100* 138* 86  BUN 29* 27* 25*  CREATININE 1.01 1.00 1.05  CALCIUM 9.5 8.5 8.4  MG  --  1.6  --   PHOS  --  3.0  --    Liver Function Tests:  Recent Labs Lab 04/03/14 2240 04/04/14 0330  AST 18 17  ALT 9 9  ALKPHOS 85 83  BILITOT <0.2* <0.2*  PROT 5.4* 5.5*  ALBUMIN 2.4* 2.4*   No results found for this basename: LIPASE, AMYLASE,  in the last 168 hours No results found for this basename: AMMONIA,  in the last 168 hours CBC:  Recent Labs Lab 04/03/14 1745 04/03/14 2240  04/04/14 0330  WBC 8.7 7.2 5.4  NEUTROABS 6.0 5.1  --   HGB 10.0* 9.1* 8.6*  HCT 30.9* 27.2* 27.3*  MCV 92.0 91.0 92.5  PLT 434* 332 350   Cardiac Enzymes: No results found for this basename: CKTOTAL, CKMB, CKMBINDEX, TROPONINI,  in the last 168 hours BNP: BNP (last 3 results) No results found for this basename: PROBNP,  in the last 8760 hours CBG:  Recent Labs Lab 04/04/14 0832 04/05/14 0749 04/06/14 0755  GLUCAP 88 71 73    Time coordinating discharge: Over 30 minutes

## 2014-04-06 NOTE — Care Management Note (Signed)
Pt disposition to SNF. CM to sign off.   Venita Lick Icela Glymph,MSN,RN [phone]

## 2014-04-07 ENCOUNTER — Non-Acute Institutional Stay (SKILLED_NURSING_FACILITY): Payer: Medicare Other | Admitting: Adult Health

## 2014-04-07 ENCOUNTER — Encounter: Payer: Self-pay | Admitting: Adult Health

## 2014-04-07 DIAGNOSIS — I82409 Acute embolism and thrombosis of unspecified deep veins of unspecified lower extremity: Secondary | ICD-10-CM

## 2014-04-07 DIAGNOSIS — I10 Essential (primary) hypertension: Secondary | ICD-10-CM | POA: Diagnosis not present

## 2014-04-07 DIAGNOSIS — D638 Anemia in other chronic diseases classified elsewhere: Secondary | ICD-10-CM

## 2014-04-07 DIAGNOSIS — G4700 Insomnia, unspecified: Secondary | ICD-10-CM

## 2014-04-07 DIAGNOSIS — E119 Type 2 diabetes mellitus without complications: Secondary | ICD-10-CM

## 2014-04-07 DIAGNOSIS — N318 Other neuromuscular dysfunction of bladder: Secondary | ICD-10-CM

## 2014-04-07 DIAGNOSIS — J189 Pneumonia, unspecified organism: Secondary | ICD-10-CM | POA: Diagnosis not present

## 2014-04-07 DIAGNOSIS — E039 Hypothyroidism, unspecified: Secondary | ICD-10-CM

## 2014-04-07 DIAGNOSIS — N3281 Overactive bladder: Secondary | ICD-10-CM

## 2014-04-07 DIAGNOSIS — G2581 Restless legs syndrome: Secondary | ICD-10-CM

## 2014-04-10 ENCOUNTER — Encounter: Payer: Self-pay | Admitting: *Deleted

## 2014-04-10 LAB — CULTURE, BLOOD (ROUTINE X 2)
CULTURE: NO GROWTH
Culture: NO GROWTH

## 2014-04-11 ENCOUNTER — Non-Acute Institutional Stay (SKILLED_NURSING_FACILITY): Payer: Medicare Other | Admitting: Internal Medicine

## 2014-04-11 DIAGNOSIS — E119 Type 2 diabetes mellitus without complications: Secondary | ICD-10-CM | POA: Diagnosis not present

## 2014-04-11 DIAGNOSIS — N183 Chronic kidney disease, stage 3 unspecified: Secondary | ICD-10-CM | POA: Insufficient documentation

## 2014-04-11 DIAGNOSIS — I10 Essential (primary) hypertension: Secondary | ICD-10-CM

## 2014-04-11 DIAGNOSIS — J189 Pneumonia, unspecified organism: Secondary | ICD-10-CM

## 2014-04-11 DIAGNOSIS — E039 Hypothyroidism, unspecified: Secondary | ICD-10-CM | POA: Diagnosis not present

## 2014-04-11 DIAGNOSIS — E1122 Type 2 diabetes mellitus with diabetic chronic kidney disease: Secondary | ICD-10-CM | POA: Insufficient documentation

## 2014-04-11 NOTE — Progress Notes (Signed)
HISTORY & PHYSICAL  DATE: 04/11/2014   FACILITY: [facility]  LEVEL OF CARE: SNF (31)  ALLERGIES:  Allergies  Allergen Reactions  . Sulfa Antibiotics Nausea And Vomiting  . Penicillins Hives and Rash    CHIEF COMPLAINT:  Manage pneumonia, hypothyroidism and diabetes mellitus  HISTORY OF PRESENT ILLNESS: Patient is a 80 year old Caucasian female who was hospitalized secondary to worsening shortness of breath and cough. After hospitalization she is admitted to this facility for short-term rehabilitation.  PNEUMONIA: The pneumonia remains stable.  The patient denies ongoing chest pain, shortness of breath, fever, chills or night sweats. No complications reported from the current antibiotic being used. Patient has ongoing cough.  HYPOTHYROIDISM: The hypothyroidism remains stable. No complications noted from the medications presently being used.  The patient denies constipation.  Last TSH greater than 100 , free T4 0.36.  DM:pt's DM remains stable.  Pt denies polyuria, polydipsia, polyphagia, changes in vision or hypoglycemic episodes.  No complications noted from the medication presently being used.  Last hemoglobin A1c is: 6.6.  PAST MEDICAL HISTORY :  Past Medical History  Diagnosis Date  . Cyst and pseudocyst of pancreas   . Unspecified essential hypertension   . Other and unspecified hyperlipidemia   . Polymyalgia rheumatica   . Personal history of unspecified digestive disease   . Restless legs syndrome (RLS)   . Insomnia, unspecified   . Other malaise and fatigue   . Diverticulosis of colon (without mention of hemorrhage)   . Acute gastritis without mention of hemorrhage   . Duodenitis without mention of hemorrhage   . Diaphragmatic hernia without mention of obstruction or gangrene   . Esophageal reflux   . Stricture and stenosis of esophagus   . OAB (overactive bladder)   . Left leg DVT jan 2013  . Unspecified sleep apnea     hx of sleep  apnea, none since weight loss  . Diabetes mellitus   . Anemia   . Thyroid disease     hypothyroidism  . CAP (community acquired pneumonia) 04/03/2014    PAST SURGICAL HISTORY: Past Surgical History  Procedure Laterality Date  . Oophorectomy  1944    not sure which ovary  . Rotator cuff repair  right     4-5 yrs ago  . Eye surgery      cataracts removed-bilateral eyes  . Back surgery  2009    lower back  . Colon surgery  June 17, 2012    surgery for bleed  . Shoulder open rotator cuff repair  08/04/2012    Procedure: ROTATOR CUFF REPAIR SHOULDER OPEN;  Surgeon: Tobi Bastos, MD;  Location: [facility];  Service: Orthopedics;  Laterality: Left;  with Anchors and Graft    SOCIAL HISTORY:  reports that she has never smoked. She has never used smokeless tobacco. She reports that she does not drink alcohol or use illicit drugs.  FAMILY HISTORY:  Family History  Problem Relation Age of Onset  . Melanoma Daughter     died age 23  . Melanoma Son     CURRENT MEDICATIONS: Reviewed per MAR/see medication list  REVIEW OF SYSTEMS:  See HPI otherwise 14 point ROS is negative.  PHYSICAL EXAMINATION  VS:  See VS section  GENERAL: no acute distress, thin body habitus EYES: conjunctivae normal, sclerae normal, normal eye lids MOUTH/THROAT: lips without lesions,no lesions in the mouth,tongue is without lesions,uvula elevates in midline NECK: supple, trachea midline,  no neck masses, no thyroid tenderness, no thyromegaly LYMPHATICS: no LAN in the neck, no supraclavicular LAN RESPIRATORY: breathing is even & unlabored, BS diminished CARDIAC: RRR, no murmur,no extra heart sounds, no edema GI:  ABDOMEN: abdomen soft, normal BS, no masses, no tenderness  LIVER/SPLEEN: no hepatomegaly, no splenomegaly MUSCULOSKELETAL: HEAD: normal to inspection & palpation BACK: no kyphosis, scoliosis or spinal processes tenderness EXTREMITIES: LEFT UPPER EXTREMITY: Moderate range of motion, normal  strength & tone RIGHT UPPER EXTREMITY:  Moderate range of motion, normal strength & tone LEFT LOWER EXTREMITY:  full range of motion, normal strength & tone RIGHT LOWER EXTREMITY:  full range of motion, normal strength & tone PSYCHIATRIC: the patient is alert & oriented to person, affect & behavior appropriate  LABS/RADIOLOGY:  Labs reviewed: Basic Metabolic Panel:  Recent Labs  04/03/14 1745 04/03/14 2240 04/04/14 0330  NA 140 137 139  K 4.3 4.1 4.4  CL 100 99 101  CO2 26 26 27   GLUCOSE 100* 138* 86  BUN 29* 27* 25*  CREATININE 1.01 1.00 1.05  CALCIUM 9.5 8.5 8.4  MG  --  1.6  --   PHOS  --  3.0  --    Liver Function Tests:  Recent Labs  12/24/13 1255 04/03/14 2240 04/04/14 0330  AST 26 18 17   ALT 14 9 9   ALKPHOS 81 85 83  BILITOT 0.2* <0.2* <0.2*  PROT 6.0 5.4* 5.5*  ALBUMIN 3.3* 2.4* 2.4*    Recent Labs  05/27/13 1719 12/24/13 1255  LIPASE 11 40   CBC:  Recent Labs  12/24/13 1255 04/03/14 1745 04/03/14 2240 04/04/14 0330  WBC 4.9 8.7 7.2 5.4  NEUTROABS 3.1 6.0 5.1  --   HGB 10.0* 10.0* 9.1* 8.6*  HCT 31.0* 30.9* 27.2* 27.3*  MCV 90.1 92.0 91.0 92.5  PLT 195 434* 332 350   Cardiac Enzymes:  Recent Labs  05/27/13 1719 05/27/13 2244 05/28/13 0425  TROPONINI <0.30 <0.30 <0.30   CBG:  Recent Labs  04/04/14 0832 04/05/14 0749 04/06/14 0755  GLUCAP 88 71 73    LEFT LOWER EXTREMITY VENOUS DOPPLER ULTRASOUND   TECHNIQUE: Gray-scale sonography with graded compression, as well as color Doppler and duplex ultrasound, were performed to evaluate the deep venous system from the level of the common femoral vein through the popliteal and proximal calf veins. Spectral Doppler was utilized to evaluate flow at rest and with distal augmentation maneuvers.   COMPARISON:  None.   FINDINGS: Thrombus within deep veins: The femoral vein in the distal thigh and the popliteal vein demonstrate some nonocclusive mural thrombus and intraluminal web  formation consistent with chronic thrombus and prior DVT. No acute appearing thrombus is identified.   Compressibility of deep veins:  Normal.   Duplex waveform respiratory phasicity:  Normal.   Duplex waveform response to augmentation:  Normal.   Venous reflux:  None visualized.   Other findings: No evidence of superficial thrombophlebitis or abnormal fluid collection.   IMPRESSION: Chronic appearing changes of the distal femoral vein and popliteal vein consistent with prior DVT. No acute DVT is identified.   PORTABLE CHEST - 1 VIEW   COMPARISON:  04/03/2014.   FINDINGS: The cardiac silhouette, mediastinal and hilar contours are stable. There is tortuosity and calcification of the thoracic aorta. There are multiple skin folds overlying the chest and the examination is limited by the patient's arms being down by the side. There is bibasilar atelectasis, left greater than right. No edema or pneumothorax.   IMPRESSION: Limited  examination.   Bibasilar atelectasis, left greater than right.    ASSESSMENT/PLAN:  Pneumonia-continue Levaquin Hypothyroidism-new problem. levothyroxene was initiated. Recheck TSH in 6 weeks. Diabetes mellitus-well controlled Hypertension-well controlled Anemia of chronic disease-recheck Restless leg syndrome-continue requip Check CBC  I have reviewed patient's medical records received at admission/from hospitalization.  CPT CODE: 09811  Gayani Y Dasanayaka, [place] [phone]

## 2014-05-05 ENCOUNTER — Non-Acute Institutional Stay (SKILLED_NURSING_FACILITY): Payer: Medicare Other | Admitting: Adult Health

## 2014-05-05 DIAGNOSIS — I82409 Acute embolism and thrombosis of unspecified deep veins of unspecified lower extremity: Secondary | ICD-10-CM

## 2014-05-05 DIAGNOSIS — G4700 Insomnia, unspecified: Secondary | ICD-10-CM

## 2014-05-05 DIAGNOSIS — N318 Other neuromuscular dysfunction of bladder: Secondary | ICD-10-CM

## 2014-05-05 DIAGNOSIS — I10 Essential (primary) hypertension: Secondary | ICD-10-CM

## 2014-05-05 DIAGNOSIS — E039 Hypothyroidism, unspecified: Secondary | ICD-10-CM

## 2014-05-05 DIAGNOSIS — J189 Pneumonia, unspecified organism: Secondary | ICD-10-CM

## 2014-05-05 DIAGNOSIS — G2581 Restless legs syndrome: Secondary | ICD-10-CM

## 2014-05-05 DIAGNOSIS — D638 Anemia in other chronic diseases classified elsewhere: Secondary | ICD-10-CM

## 2014-05-05 DIAGNOSIS — N3281 Overactive bladder: Secondary | ICD-10-CM

## 2014-05-05 DIAGNOSIS — E119 Type 2 diabetes mellitus without complications: Secondary | ICD-10-CM

## 2014-05-11 NOTE — Progress Notes (Signed)
Patient ID: [name], female   DOB: 01-20-1919, 80 y.o.   MRN: [id number]               PROGRESS NOTE  DATE: 04/07/2014  FACILITY: Nursing Home Location: [facility]  LEVEL OF CARE: SNF (31)  Acute Visit  CHIEF COMPLAINT:  Follow-up Hospitalization  HISTORY OF PRESENT ILLNESS: This is a 80 year old female who has been admitted to [facility] on 04/06/14 from [facility] with discharge diagnosis of Community acquired pneumonia. She has been admitted for a short-term rehabilitation.   REASSESSMENT OF ONGOING PROBLEM(S):  HTN: Pt 's HTN remains stable.  Denies CP, sob, DOE, pedal edema, headaches, dizziness or visual disturbances.  No complications from the medications currently being used.  Last BP : 130/71  HYPOTHYROIDISM: The hypothyroidism is unstable. No complications noted from the medications presently being used.  The patient denies fatigue or constipation.  4/15 TSH>100  DM:pt's DM remains stable.  Pt denies polyuria, polydipsia, polyphagia, changes in vision or hypoglycemic episodes.  No complications noted from the medication presently being used. 4/15 hemoglobin A1c is: 6.6   PAST MEDICAL HISTORY : Reviewed.  No changes/see problem list  CURRENT MEDICATIONS: Reviewed per MAR/see medication list  REVIEW OF SYSTEMS:  GENERAL: no change in appetite, no fatigue, no weight changes, no fever, chills or weakness RESPIRATORY: no cough, SOB, DOE, wheezing, hemoptysis CARDIAC: no chest pain, edema or palpitations GI: no abdominal pain, diarrhea, constipation, heart burn, nausea or vomiting  PHYSICAL EXAMINATION  GENERAL: no acute distress, normal body habitus EYES: conjunctivae normal, sclerae normal, normal eye lids NECK: supple, trachea midline, no neck masses, no thyroid tenderness, no thyromegaly LYMPHATICS: no LAN in the neck, no supraclavicular LAN RESPIRATORY: breathing is even & unlabored, BS CTAB CARDIAC: RRR, no murmur,no extra heart  sounds, no edema GI: abdomen soft, normal BS, no masses, no tenderness, no hepatomegaly, no splenomegaly EXTREMITIES: able to move all 4 extremities PSYCHIATRIC: the patient is alert & oriented to person, affect & behavior appropriate  LABS/RADIOLOGY: Labs reviewed: Basic Metabolic Panel:  Recent Labs  04/03/14 1745 04/03/14 2240 04/04/14 0330  NA 140 137 139  K 4.3 4.1 4.4  CL 100 99 101  CO2 26 26 27   GLUCOSE 100* 138* 86  BUN 29* 27* 25*  CREATININE 1.01 1.00 1.05  CALCIUM 9.5 8.5 8.4  MG  --  1.6  --   PHOS  --  3.0  --    Liver Function Tests:  Recent Labs  12/24/13 1255 04/03/14 2240 04/04/14 0330  AST 26 18 17   ALT 14 9 9   ALKPHOS 81 85 83  BILITOT 0.2* <0.2* <0.2*  PROT 6.0 5.4* 5.5*  ALBUMIN 3.3* 2.4* 2.4*    Recent Labs  05/27/13 1719 12/24/13 1255  LIPASE 11 40   CBC:  Recent Labs  12/24/13 1255 04/03/14 1745 04/03/14 2240 04/04/14 0330  WBC 4.9 8.7 7.2 5.4  NEUTROABS 3.1 6.0 5.1  --   HGB 10.0* 10.0* 9.1* 8.6*  HCT 31.0* 30.9* 27.2* 27.3*  MCV 90.1 92.0 91.0 92.5  PLT 195 434* 332 350   Cardiac Enzymes:  Recent Labs  05/27/13 1719 05/27/13 2244 05/28/13 0425  TROPONINI <0.30 <0.30 <0.30    CBG:  Recent Labs  04/04/14 0832 04/05/14 0749 04/06/14 0755  GLUCAP 88 71 73     ASSESSMENT/PLAN:  CAP - continue Levaquin Hypertension - well-controlled; continue Losartan Hypothyroidism - continue Synthroid; check tsh in 1 month Insomnia -  stable Overactive bladder - continue Myrbetriq DVT of leg - continue Xarelto Anemia of chronic disease - stable Diabetes Mellitus, type 2 - continue Metformin Restless Leg Syndrome - continue Requip   CPT CODE: 91478  Seth Bake - NP [facility] [phone]

## 2014-05-12 ENCOUNTER — Encounter: Payer: Self-pay | Admitting: Adult Health

## 2014-05-12 NOTE — Progress Notes (Signed)
Patient ID: [name], female   DOB: July 11, 1919, 80 y.o.   MRN: [id number]         PROGRESS NOTE  DATE:  05/05/14  FACILITY: Nursing Home Location: [facility]  LEVEL OF CARE: SNF (31)  Acute Visit  CHIEF COMPLAINT:  Discharge Notes  HISTORY OF PRESENT ILLNESS: This is a 80 year old female who is for discharge home with Home health PT and OT. She has been admitted to [facility] on 04/06/14 from [facility] with discharge diagnosis of Community acquired pneumonia. Patient was admitted to this facility for short-term rehabilitation after the patient's recent hospitalization.  Patient has completed SNF rehabilitation and therapy has cleared the patient for discharge.  REASSESSMENT OF ONGOING PROBLEM(S):  HTN: Pt 's HTN remains stable.  Denies CP, sob, DOE, pedal edema, headaches, dizziness or visual disturbances.  No complications from the medications currently being used.  Last BP : 121/67  DVT: The DVTs remains stable.  Patient denies calf pain, chest pain or shortness of breath.  Patient is currently on anti-coagulation and does not report any complications from that.  DM:pt's DM remains stable.  Pt denies polyuria, polydipsia, polyphagia, changes in vision or hypoglycemic episodes.  No complications noted from the medication presently being used. 4/15 hemoglobin A1c is: 6.6   PAST MEDICAL HISTORY : Reviewed.  No changes/see problem list  CURRENT MEDICATIONS: Reviewed per MAR/see medication list  REVIEW OF SYSTEMS:  GENERAL: no change in appetite, no fatigue, no weight changes, no fever, chills or weakness RESPIRATORY: no cough, SOB, DOE, wheezing, hemoptysis CARDIAC: no chest pain, edema or palpitations GI: no abdominal pain, diarrhea, constipation, heart burn, nausea or vomiting  PHYSICAL EXAMINATION  GENERAL: no acute distress, normal body habitus NECK: supple, trachea midline, no neck masses, no thyroid tenderness, no thyromegaly LYMPHATICS:  no LAN in the neck, no supraclavicular LAN RESPIRATORY: breathing is even & unlabored, BS CTAB CARDIAC: RRR, no murmur,no extra heart sounds, no edema GI: abdomen soft, normal BS, no masses, no tenderness, no hepatomegaly, no splenomegaly EXTREMITIES: able to move all 4 extremities PSYCHIATRIC: the patient is alert & oriented to person, affect & behavior appropriate  LABS/RADIOLOGY: 04/14/14   WBC 6.1 hemoglobin 11.1 hematocrit 35.2 Labs reviewed: Basic Metabolic Panel:  Recent Labs  04/03/14 1745 04/03/14 2240 04/04/14 0330  NA 140 137 139  K 4.3 4.1 4.4  CL 100 99 101  CO2 26 26 27   GLUCOSE 100* 138* 86  BUN 29* 27* 25*  CREATININE 1.01 1.00 1.05  CALCIUM 9.5 8.5 8.4  MG  --  1.6  --   PHOS  --  3.0  --    Liver Function Tests:  Recent Labs  12/24/13 1255 04/03/14 2240 04/04/14 0330  AST 26 18 17   ALT 14 9 9   ALKPHOS 81 85 83  BILITOT 0.2* <0.2* <0.2*  PROT 6.0 5.4* 5.5*  ALBUMIN 3.3* 2.4* 2.4*    Recent Labs  05/27/13 1719 12/24/13 1255  LIPASE 11 40   CBC:  Recent Labs  12/24/13 1255 04/03/14 1745 04/03/14 2240 04/04/14 0330  WBC 4.9 8.7 7.2 5.4  NEUTROABS 3.1 6.0 5.1  --   HGB 10.0* 10.0* 9.1* 8.6*  HCT 31.0* 30.9* 27.2* 27.3*  MCV 90.1 92.0 91.0 92.5  PLT 195 434* 332 350   Cardiac Enzymes:  Recent Labs  05/27/13 1719 05/27/13 2244 05/28/13 0425  TROPONINI <0.30 <0.30 <0.30    CBG:  Recent Labs  04/04/14 0832 04/05/14 0749  04/06/14 0755  GLUCAP 88 71 73     ASSESSMENT/PLAN:  CAP - Resolved Hypertension - well-controlled; continue Losartan Hypothyroidism - continue Synthroid Insomnia - stable Overactive bladder - continue Myrbetriq DVT of leg - continue Xarelto Anemia of chronic disease - stable Diabetes Mellitus, type 2 - continue Metformin Restless Leg Syndrome - continue Requip  I have filled out patient's discharge paperwork and written prescriptions.  Patient will receive home health PT and OT.   Total  discharge time: Less than 30 minutes  Discharge time involved coordination of the discharge process with Education officer, museum, nursing staff and therapy department. Medical justification for home health services verified.   CPT CODE: 60454  Seth Bake - NP [facility] [phone]

## 2014-05-15 DIAGNOSIS — R609 Edema, unspecified: Secondary | ICD-10-CM | POA: Diagnosis not present

## 2014-05-15 DIAGNOSIS — N19 Unspecified kidney failure: Secondary | ICD-10-CM | POA: Diagnosis not present

## 2014-05-15 DIAGNOSIS — E119 Type 2 diabetes mellitus without complications: Secondary | ICD-10-CM | POA: Diagnosis not present

## 2014-05-15 DIAGNOSIS — H109 Unspecified conjunctivitis: Secondary | ICD-10-CM | POA: Diagnosis not present

## 2014-05-15 DIAGNOSIS — I10 Essential (primary) hypertension: Secondary | ICD-10-CM | POA: Diagnosis not present

## 2014-05-15 DIAGNOSIS — R32 Unspecified urinary incontinence: Secondary | ICD-10-CM | POA: Diagnosis not present

## 2014-05-15 DIAGNOSIS — I824Z9 Acute embolism and thrombosis of unspecified deep veins of unspecified distal lower extremity: Secondary | ICD-10-CM | POA: Diagnosis not present

## 2014-05-15 DIAGNOSIS — E538 Deficiency of other specified B group vitamins: Secondary | ICD-10-CM | POA: Diagnosis not present

## 2014-06-07 DIAGNOSIS — M545 Low back pain, unspecified: Secondary | ICD-10-CM | POA: Diagnosis not present

## 2014-06-07 DIAGNOSIS — M542 Cervicalgia: Secondary | ICD-10-CM | POA: Diagnosis not present

## 2014-06-22 DIAGNOSIS — E119 Type 2 diabetes mellitus without complications: Secondary | ICD-10-CM | POA: Diagnosis not present

## 2014-06-22 DIAGNOSIS — H538 Other visual disturbances: Secondary | ICD-10-CM | POA: Diagnosis not present

## 2014-06-23 DIAGNOSIS — Z5181 Encounter for therapeutic drug level monitoring: Secondary | ICD-10-CM | POA: Diagnosis not present

## 2014-06-23 DIAGNOSIS — E119 Type 2 diabetes mellitus without complications: Secondary | ICD-10-CM | POA: Diagnosis not present

## 2014-06-23 DIAGNOSIS — G609 Hereditary and idiopathic neuropathy, unspecified: Secondary | ICD-10-CM | POA: Diagnosis not present

## 2014-06-23 DIAGNOSIS — F329 Major depressive disorder, single episode, unspecified: Secondary | ICD-10-CM | POA: Diagnosis not present

## 2014-06-23 DIAGNOSIS — R5381 Other malaise: Secondary | ICD-10-CM | POA: Diagnosis not present

## 2014-06-23 DIAGNOSIS — Z79899 Other long term (current) drug therapy: Secondary | ICD-10-CM | POA: Diagnosis not present

## 2014-06-23 DIAGNOSIS — I824Z9 Acute embolism and thrombosis of unspecified deep veins of unspecified distal lower extremity: Secondary | ICD-10-CM | POA: Diagnosis not present

## 2014-06-23 DIAGNOSIS — R5383 Other fatigue: Secondary | ICD-10-CM | POA: Diagnosis not present

## 2014-06-23 DIAGNOSIS — R609 Edema, unspecified: Secondary | ICD-10-CM | POA: Diagnosis not present

## 2014-06-23 DIAGNOSIS — I2699 Other pulmonary embolism without acute cor pulmonale: Secondary | ICD-10-CM | POA: Diagnosis not present

## 2014-06-23 DIAGNOSIS — Z7901 Long term (current) use of anticoagulants: Secondary | ICD-10-CM | POA: Diagnosis not present

## 2014-06-23 DIAGNOSIS — R0609 Other forms of dyspnea: Secondary | ICD-10-CM | POA: Diagnosis not present

## 2014-06-23 DIAGNOSIS — E538 Deficiency of other specified B group vitamins: Secondary | ICD-10-CM | POA: Diagnosis not present

## 2014-06-23 DIAGNOSIS — I10 Essential (primary) hypertension: Secondary | ICD-10-CM | POA: Diagnosis not present

## 2014-06-23 DIAGNOSIS — F3289 Other specified depressive episodes: Secondary | ICD-10-CM | POA: Diagnosis not present

## 2014-06-23 DIAGNOSIS — N19 Unspecified kidney failure: Secondary | ICD-10-CM | POA: Diagnosis not present

## 2014-06-23 DIAGNOSIS — R0989 Other specified symptoms and signs involving the circulatory and respiratory systems: Secondary | ICD-10-CM | POA: Diagnosis not present

## 2014-07-17 DIAGNOSIS — Z86718 Personal history of other venous thrombosis and embolism: Secondary | ICD-10-CM | POA: Diagnosis not present

## 2014-07-17 DIAGNOSIS — R5383 Other fatigue: Secondary | ICD-10-CM | POA: Diagnosis not present

## 2014-07-17 DIAGNOSIS — R5381 Other malaise: Secondary | ICD-10-CM | POA: Diagnosis not present

## 2014-07-17 DIAGNOSIS — D151 Benign neoplasm of heart: Secondary | ICD-10-CM | POA: Diagnosis not present

## 2014-07-17 DIAGNOSIS — Z86711 Personal history of pulmonary embolism: Secondary | ICD-10-CM | POA: Diagnosis not present

## 2014-07-24 DIAGNOSIS — M76899 Other specified enthesopathies of unspecified lower limb, excluding foot: Secondary | ICD-10-CM | POA: Diagnosis not present

## 2014-08-01 ENCOUNTER — Encounter: Payer: Self-pay | Admitting: *Deleted

## 2014-08-01 DIAGNOSIS — M1990 Unspecified osteoarthritis, unspecified site: Secondary | ICD-10-CM | POA: Insufficient documentation

## 2014-08-01 DIAGNOSIS — Z86711 Personal history of pulmonary embolism: Secondary | ICD-10-CM | POA: Insufficient documentation

## 2014-08-01 DIAGNOSIS — G4730 Sleep apnea, unspecified: Secondary | ICD-10-CM

## 2014-08-02 ENCOUNTER — Institutional Professional Consult (permissible substitution) (INDEPENDENT_AMBULATORY_CARE_PROVIDER_SITE_OTHER): Payer: Medicare Other | Admitting: Surgery

## 2014-08-02 ENCOUNTER — Encounter: Payer: Self-pay | Admitting: Surgery

## 2014-08-02 VITALS — BP 112/64 | HR 80 | Resp 20 | Ht 60.0 in | Wt 103.0 lb

## 2014-08-02 DIAGNOSIS — D151 Benign neoplasm of heart: Secondary | ICD-10-CM

## 2014-08-03 ENCOUNTER — Encounter: Payer: Self-pay | Admitting: Surgery

## 2014-08-03 DIAGNOSIS — D151 Benign neoplasm of heart: Secondary | ICD-10-CM | POA: Insufficient documentation

## 2014-08-03 NOTE — Progress Notes (Signed)
Cardiothoracic Surgery Consultation   PCP is OSEI-BONSU,GEORGE, MD Referring Provider is Vyas, Sylvan Cheese, MD  Chief Complaint  Patient presents with  . New Evaluation    Left atrial myxoma    HPI:  The patient is a 80 year old woman with a history of bilateral DVT's at different times with pulmonary embolism treated with Xarelto, hypertension and diabetes who reports developing pneumonia in Feb 2015. She says she was treated but had to be admitted for pneumonia in late April. She was treated and discharged to [facility]. She is back home with a caretaker helping her but says she has never fully recovered from that illness. She continues to have some shortness of breath with activity and fatigue with generalized weakness. She had an echo done at an outside site on 06/23/2014 that reportedly showed a 2.4 cm mass in the left atrium suggestive of a myxoma or thrombus. This study is unavailable for review. A prior echo in 2010 at an outside site showed extensive MAC and some shadowing in the left atrium that was felt to be an artifact but they commented that they could not rule out a mass. She had an echo on 02/18/2013 that did not show any mass in the left atrium but did show extensive MAC. She was referred to Dr. Woody Seller for evaluation and they to me to decide if she was a surgical candidate before further workup was done.  Past Medical History  Diagnosis Date  . Cyst and pseudocyst of pancreas   . Unspecified essential hypertension   . Other and unspecified hyperlipidemia   . Polymyalgia rheumatica   . Personal history of unspecified digestive disease   . Restless legs syndrome (RLS)   . Insomnia, unspecified   . Other malaise and fatigue   . Diverticulosis of colon (without mention of hemorrhage)   . Acute gastritis without mention of hemorrhage   . Duodenitis without mention of hemorrhage   . Diaphragmatic hernia without mention of obstruction or gangrene   . Esophageal reflux   .  Stricture and stenosis of esophagus   . OAB (overactive bladder)   . Left leg DVT jan 2013  . Unspecified sleep apnea     hx of sleep apnea, none since weight loss  . Diabetes mellitus   . Anemia   . Thyroid disease     hypothyroidism  . CAP (community acquired pneumonia) 04/03/2014  . Arthritis   . Hx pulmonary embolism     Past Surgical History  Procedure Laterality Date  . Oophorectomy  1944    not sure which ovary  . Rotator cuff repair  right     4-5 yrs ago  . Eye surgery      cataracts removed-bilateral eyes  . Back surgery  2009    lower back  . Colon surgery  June 17, 2012    surgery for bleed  . Shoulder open rotator cuff repair  08/04/2012    Procedure: ROTATOR CUFF REPAIR SHOULDER OPEN;  Surgeon: Tobi Bastos, MD;  Location: [facility];  Service: Orthopedics;  Laterality: Left;  with Anchors and Graft  . Reduction mammaplasty Bilateral 1980    Family History  Problem Relation Age of Onset  . Melanoma Daughter     died age 78  . Melanoma Son     Social History History  Substance Use Topics  . Smoking status: Never Smoker   . Smokeless tobacco: Never Used  . Alcohol Use: No  Current Outpatient Prescriptions  Medication Sig Dispense Refill  . acetaminophen (TYLENOL) 325 MG tablet Take 2 tablets (650 mg total) by mouth every 4 (four) hours as needed for mild pain, fever or headache (or Fever >/= 101).  30 tablet  0  . cetirizine (ZYRTEC) 10 MG tablet Take 10 mg by mouth daily.      . chlorpheniramine-HYDROcodone (TUSSIONEX) 10-8 MG/5ML LQCR Take 5 mLs by mouth every 12 (twelve) hours.  115 mL  0  . cyanocobalamin (,VITAMIN B-12,) 1000 MCG/ML injection Inject 1,000 mcg into the muscle every 30 (thirty) days.      . feeding supplement, ENSURE COMPLETE, (ENSURE COMPLETE) LIQD Take 237 mLs by mouth 3 (three) times daily with meals.  10 Bottle  0  . levofloxacin (LEVAQUIN) 750 MG tablet Take 1 tablet (750 mg total) by mouth daily.  7 tablet  0  .  levothyroxine (SYNTHROID, LEVOTHROID) 25 MCG tablet Take 1 tablet (25 mcg total) by mouth daily before breakfast.  30 tablet  0  . losartan (COZAAR) 50 MG tablet Take 50 mg by mouth daily.      . metFORMIN (GLUCOPHAGE) 500 MG tablet Take 1 tablet (500 mg total) by mouth daily with breakfast.  30 tablet  0  . mirabegron ER (MYRBETRIQ) 25 MG TB24 tablet Take 25 mg by mouth every morning.      . mirtazapine (REMERON) 7.5 MG tablet Take 7.5 mg by mouth at bedtime.      . ondansetron (ZOFRAN) 4 MG tablet Take 1 tablet (4 mg total) by mouth every 6 (six) hours as needed for nausea.  20 tablet  0  . oxyCODONE (OXY IR/ROXICODONE) 5 MG immediate release tablet Take 2 tablets (10 mg total) by mouth every 6 (six) hours as needed for moderate pain.  30 tablet  0  . pantoprazole (PROTONIX) 40 MG tablet Take 1 tablet (40 mg total) by mouth daily.  30 tablet  0  . polyethylene glycol (MIRALAX / GLYCOLAX) packet Take 17 g by mouth daily as needed for moderate constipation.  14 each  0  . PRESCRIPTION MEDICATION Antibiotic shot - name unknown at Dr. Doristine Section -Bonsu      . rivaroxaban (XARELTO) 10 MG TABS tablet Take 10 mg by mouth daily.      . ropinirole (REQUIP) 5 MG tablet Take 5 mg by mouth at bedtime.      . senna-docusate (SENOKOT-S) 8.6-50 MG per tablet Take 2 tablets by mouth every morning.      Marland Kitchen VOLTAREN 1 % GEL Apply 2 g topically 2 (two) times daily as needed (for pain). On back       No current facility-administered medications for this visit.    Allergies  Allergen Reactions  . Sulfa Antibiotics Nausea And Vomiting  . Penicillins Hives and Rash    Review of Systems  Constitutional: Positive for activity change, appetite change, fatigue and unexpected weight change.  HENT: Positive for hearing loss.   Eyes: Negative.   Respiratory: Positive for shortness of breath. Negative for cough and chest tightness.   Cardiovascular: Positive for leg swelling. Negative for chest pain and  palpitations.  Gastrointestinal: Negative.   Endocrine: Negative.   Genitourinary: Positive for frequency.  Musculoskeletal: Positive for arthralgias, gait problem, joint swelling and myalgias.  Allergic/Immunologic: Negative.   Neurological: Positive for numbness.       In right hand and feet  Hematological:       Bilateral DVT in past  Psychiatric/Behavioral: Positive  for dysphoric mood. The patient is nervous/anxious.     BP 112/64  Pulse 80  Resp 20  Ht 5' (1.524 m)  Wt 103 lb (46.72 kg)  BMI 20.12 kg/m2  SpO2 96% Physical Exam   Diagnostic Tests:  none  Impression:  She has a possible mass in the left atrium on echo done at an outside site and it is not possible to review the study. I am always skeptical about the results of echocardiograms and vascular studies done at outside sites. She does have a very calcified mitral annulus which can cause some artifacts. Her echo a year and a half ago at [facility] did not show a mass. I have reviewed that study. She is 80 years old and frail, in generally poor condition and has never really recovered from her pneumonia this past spring. She is certainly not a candidate for heart surgery even if there is a myxoma. I would not recommend any invasive testing but a repeat echo could be done to be sure what is there. I doubt that her current debilitated condition is due to this. I discussed all of this with the patient and her caretaker.  Plan:  She will follow up with Dr. Woody Seller and Dr. Vista Lawman.

## 2014-08-08 DIAGNOSIS — R5383 Other fatigue: Secondary | ICD-10-CM | POA: Diagnosis not present

## 2014-08-08 DIAGNOSIS — Z86711 Personal history of pulmonary embolism: Secondary | ICD-10-CM | POA: Diagnosis not present

## 2014-08-08 DIAGNOSIS — D151 Benign neoplasm of heart: Secondary | ICD-10-CM | POA: Diagnosis not present

## 2014-08-08 DIAGNOSIS — Z86718 Personal history of other venous thrombosis and embolism: Secondary | ICD-10-CM | POA: Diagnosis not present

## 2014-08-08 DIAGNOSIS — R5381 Other malaise: Secondary | ICD-10-CM | POA: Diagnosis not present

## 2014-08-09 ENCOUNTER — Other Ambulatory Visit (HOSPITAL_COMMUNITY): Payer: Self-pay | Admitting: Cardiology

## 2014-08-09 DIAGNOSIS — I5189 Other ill-defined heart diseases: Secondary | ICD-10-CM

## 2014-08-22 ENCOUNTER — Inpatient Hospital Stay (HOSPITAL_COMMUNITY): Admission: RE | Admit: 2014-08-22 | Payer: Medicare Other | Source: Ambulatory Visit

## 2014-08-23 ENCOUNTER — Ambulatory Visit (HOSPITAL_COMMUNITY)
Admission: RE | Admit: 2014-08-23 | Discharge: 2014-08-23 | Disposition: A | Discharge status: Home / Self Care | Payer: Medicare Other | Source: Ambulatory Visit | Attending: Cardiology | Admitting: Cardiology

## 2014-08-24 DIAGNOSIS — N183 Chronic kidney disease, stage 3 unspecified: Secondary | ICD-10-CM | POA: Diagnosis not present

## 2014-08-24 DIAGNOSIS — D472 Monoclonal gammopathy: Secondary | ICD-10-CM | POA: Diagnosis not present

## 2014-08-24 DIAGNOSIS — Z23 Encounter for immunization: Secondary | ICD-10-CM | POA: Diagnosis not present

## 2014-08-28 DIAGNOSIS — E538 Deficiency of other specified B group vitamins: Secondary | ICD-10-CM | POA: Diagnosis not present

## 2014-08-31 DIAGNOSIS — M545 Low back pain, unspecified: Secondary | ICD-10-CM | POA: Diagnosis not present

## 2014-08-31 DIAGNOSIS — M542 Cervicalgia: Secondary | ICD-10-CM | POA: Diagnosis not present

## 2014-09-15 DIAGNOSIS — M542 Cervicalgia: Secondary | ICD-10-CM | POA: Diagnosis not present

## 2014-09-15 DIAGNOSIS — M7062 Trochanteric bursitis, left hip: Secondary | ICD-10-CM | POA: Diagnosis not present

## 2014-09-15 DIAGNOSIS — M545 Low back pain: Secondary | ICD-10-CM | POA: Diagnosis not present

## 2014-10-01 ENCOUNTER — Inpatient Hospital Stay (HOSPITAL_COMMUNITY)
Admission: EM | Admit: 2014-10-01 | Discharge: 2014-10-09 | DRG: 391 | Disposition: A | Discharge status: Home Health | Payer: Medicare Other | Attending: Internal Medicine | Admitting: Internal Medicine

## 2014-10-01 ENCOUNTER — Emergency Department (HOSPITAL_COMMUNITY): Payer: Medicare Other

## 2014-10-01 ENCOUNTER — Encounter (HOSPITAL_COMMUNITY): Payer: Self-pay | Admitting: Emergency Medicine

## 2014-10-01 DIAGNOSIS — R1013 Epigastric pain: Secondary | ICD-10-CM

## 2014-10-01 DIAGNOSIS — M1990 Unspecified osteoarthritis, unspecified site: Secondary | ICD-10-CM | POA: Diagnosis present

## 2014-10-01 DIAGNOSIS — K219 Gastro-esophageal reflux disease without esophagitis: Secondary | ICD-10-CM | POA: Diagnosis present

## 2014-10-01 DIAGNOSIS — I82403 Acute embolism and thrombosis of unspecified deep veins of lower extremity, bilateral: Secondary | ICD-10-CM | POA: Diagnosis not present

## 2014-10-01 DIAGNOSIS — G4700 Insomnia, unspecified: Secondary | ICD-10-CM | POA: Diagnosis present

## 2014-10-01 DIAGNOSIS — Z79891 Long term (current) use of opiate analgesic: Secondary | ICD-10-CM | POA: Diagnosis not present

## 2014-10-01 DIAGNOSIS — E119 Type 2 diabetes mellitus without complications: Secondary | ICD-10-CM

## 2014-10-01 DIAGNOSIS — Z7901 Long term (current) use of anticoagulants: Secondary | ICD-10-CM

## 2014-10-01 DIAGNOSIS — D649 Anemia, unspecified: Secondary | ICD-10-CM | POA: Diagnosis present

## 2014-10-01 DIAGNOSIS — L03019 Cellulitis of unspecified finger: Secondary | ICD-10-CM | POA: Diagnosis present

## 2014-10-01 DIAGNOSIS — K5730 Diverticulosis of large intestine without perforation or abscess without bleeding: Secondary | ICD-10-CM | POA: Diagnosis not present

## 2014-10-01 DIAGNOSIS — K567 Ileus, unspecified: Secondary | ICD-10-CM | POA: Diagnosis present

## 2014-10-01 DIAGNOSIS — R195 Other fecal abnormalities: Secondary | ICD-10-CM | POA: Diagnosis present

## 2014-10-01 DIAGNOSIS — R110 Nausea: Secondary | ICD-10-CM | POA: Diagnosis not present

## 2014-10-01 DIAGNOSIS — Z79899 Other long term (current) drug therapy: Secondary | ICD-10-CM

## 2014-10-01 DIAGNOSIS — R109 Unspecified abdominal pain: Secondary | ICD-10-CM | POA: Diagnosis not present

## 2014-10-01 DIAGNOSIS — G2581 Restless legs syndrome: Secondary | ICD-10-CM | POA: Diagnosis present

## 2014-10-01 DIAGNOSIS — R059 Cough, unspecified: Secondary | ICD-10-CM

## 2014-10-01 DIAGNOSIS — R112 Nausea with vomiting, unspecified: Secondary | ICD-10-CM | POA: Diagnosis not present

## 2014-10-01 DIAGNOSIS — E43 Unspecified severe protein-calorie malnutrition: Secondary | ICD-10-CM | POA: Diagnosis not present

## 2014-10-01 DIAGNOSIS — N390 Urinary tract infection, site not specified: Secondary | ICD-10-CM

## 2014-10-01 DIAGNOSIS — N3281 Overactive bladder: Secondary | ICD-10-CM | POA: Diagnosis present

## 2014-10-01 DIAGNOSIS — Z8719 Personal history of other diseases of the digestive system: Secondary | ICD-10-CM | POA: Diagnosis not present

## 2014-10-01 DIAGNOSIS — B961 Klebsiella pneumoniae [K. pneumoniae] as the cause of diseases classified elsewhere: Secondary | ICD-10-CM | POA: Diagnosis present

## 2014-10-01 DIAGNOSIS — Z880 Allergy status to penicillin: Secondary | ICD-10-CM

## 2014-10-01 DIAGNOSIS — G4730 Sleep apnea, unspecified: Secondary | ICD-10-CM | POA: Diagnosis present

## 2014-10-01 DIAGNOSIS — K449 Diaphragmatic hernia without obstruction or gangrene: Secondary | ICD-10-CM

## 2014-10-01 DIAGNOSIS — K5080 Crohn's disease of both small and large intestine without complications: Secondary | ICD-10-CM | POA: Diagnosis present

## 2014-10-01 DIAGNOSIS — K5732 Diverticulitis of large intestine without perforation or abscess without bleeding: Secondary | ICD-10-CM | POA: Diagnosis not present

## 2014-10-01 DIAGNOSIS — J40 Bronchitis, not specified as acute or chronic: Secondary | ICD-10-CM | POA: Diagnosis present

## 2014-10-01 DIAGNOSIS — K2980 Duodenitis without bleeding: Secondary | ICD-10-CM | POA: Diagnosis present

## 2014-10-01 DIAGNOSIS — K921 Melena: Secondary | ICD-10-CM

## 2014-10-01 DIAGNOSIS — E785 Hyperlipidemia, unspecified: Secondary | ICD-10-CM | POA: Diagnosis present

## 2014-10-01 DIAGNOSIS — I10 Essential (primary) hypertension: Secondary | ICD-10-CM | POA: Diagnosis not present

## 2014-10-01 DIAGNOSIS — I82409 Acute embolism and thrombosis of unspecified deep veins of unspecified lower extremity: Secondary | ICD-10-CM | POA: Diagnosis present

## 2014-10-01 DIAGNOSIS — E039 Hypothyroidism, unspecified: Secondary | ICD-10-CM | POA: Diagnosis present

## 2014-10-01 DIAGNOSIS — K222 Esophageal obstruction: Secondary | ICD-10-CM | POA: Diagnosis present

## 2014-10-01 DIAGNOSIS — Z86711 Personal history of pulmonary embolism: Secondary | ICD-10-CM | POA: Diagnosis not present

## 2014-10-01 DIAGNOSIS — Z881 Allergy status to other antibiotic agents status: Secondary | ICD-10-CM

## 2014-10-01 DIAGNOSIS — R197 Diarrhea, unspecified: Secondary | ICD-10-CM | POA: Diagnosis not present

## 2014-10-01 DIAGNOSIS — K5900 Constipation, unspecified: Secondary | ICD-10-CM | POA: Diagnosis present

## 2014-10-01 DIAGNOSIS — E860 Dehydration: Secondary | ICD-10-CM | POA: Diagnosis not present

## 2014-10-01 DIAGNOSIS — M353 Polymyalgia rheumatica: Secondary | ICD-10-CM | POA: Diagnosis present

## 2014-10-01 DIAGNOSIS — K529 Noninfective gastroenteritis and colitis, unspecified: Secondary | ICD-10-CM | POA: Diagnosis not present

## 2014-10-01 DIAGNOSIS — Z809 Family history of malignant neoplasm, unspecified: Secondary | ICD-10-CM | POA: Diagnosis not present

## 2014-10-01 DIAGNOSIS — K50819 Crohn's disease of both small and large intestine with unspecified complications: Secondary | ICD-10-CM | POA: Diagnosis not present

## 2014-10-01 DIAGNOSIS — R05 Cough: Secondary | ICD-10-CM

## 2014-10-01 DIAGNOSIS — B9689 Other specified bacterial agents as the cause of diseases classified elsewhere: Secondary | ICD-10-CM | POA: Diagnosis not present

## 2014-10-01 DIAGNOSIS — J069 Acute upper respiratory infection, unspecified: Secondary | ICD-10-CM

## 2014-10-01 DIAGNOSIS — K5731 Diverticulosis of large intestine without perforation or abscess with bleeding: Secondary | ICD-10-CM

## 2014-10-01 DIAGNOSIS — Z86718 Personal history of other venous thrombosis and embolism: Secondary | ICD-10-CM

## 2014-10-01 DIAGNOSIS — R0689 Other abnormalities of breathing: Secondary | ICD-10-CM | POA: Diagnosis not present

## 2014-10-01 DIAGNOSIS — R1010 Upper abdominal pain, unspecified: Secondary | ICD-10-CM

## 2014-10-01 HISTORY — DX: Diaphragmatic hernia without obstruction or gangrene: K44.9

## 2014-10-01 LAB — URINE MICROSCOPIC-ADD ON

## 2014-10-01 LAB — URINALYSIS, ROUTINE W REFLEX MICROSCOPIC
Bilirubin Urine: NEGATIVE
Glucose, UA: NEGATIVE mg/dL
KETONES UR: 15 mg/dL — AB
Nitrite: NEGATIVE
Protein, ur: 30 mg/dL — AB
Specific Gravity, Urine: 1.019 (ref 1.005–1.030)
UROBILINOGEN UA: 0.2 mg/dL (ref 0.0–1.0)
pH: 7.5 (ref 5.0–8.0)

## 2014-10-01 LAB — LIPASE, BLOOD: Lipase: 22 U/L (ref 11–59)

## 2014-10-01 LAB — COMPREHENSIVE METABOLIC PANEL
ALT: 18 U/L (ref 0–35)
ANION GAP: 13 (ref 5–15)
AST: 28 U/L (ref 0–37)
Albumin: 3.5 g/dL (ref 3.5–5.2)
Alkaline Phosphatase: 92 U/L (ref 39–117)
BILIRUBIN TOTAL: 0.3 mg/dL (ref 0.3–1.2)
BUN: 26 mg/dL — AB (ref 6–23)
CO2: 25 mEq/L (ref 19–32)
CREATININE: 0.81 mg/dL (ref 0.50–1.10)
Calcium: 9.2 mg/dL (ref 8.4–10.5)
Chloride: 101 mEq/L (ref 96–112)
GFR calc Af Amer: 70 mL/min — ABNORMAL LOW (ref >90)
GFR calc non Af Amer: 61 mL/min — ABNORMAL LOW (ref >90)
Glucose, Bld: 145 mg/dL — ABNORMAL HIGH (ref 70–99)
Potassium: 4 mEq/L (ref 3.7–5.3)
Sodium: 139 mEq/L (ref 137–147)
TOTAL PROTEIN: 6.7 g/dL (ref 6.0–8.3)

## 2014-10-01 LAB — CBC WITH DIFFERENTIAL/PLATELET
BASOS ABS: 0 10*3/uL (ref 0.0–0.1)
BASOS PCT: 0 % (ref 0–1)
EOS ABS: 0 10*3/uL (ref 0.0–0.7)
EOS PCT: 0 % (ref 0–5)
HCT: 33.9 % — ABNORMAL LOW (ref 36.0–46.0)
Hemoglobin: 11 g/dL — ABNORMAL LOW (ref 12.0–15.0)
Lymphocytes Relative: 17 % (ref 12–46)
Lymphs Abs: 1 10*3/uL (ref 0.7–4.0)
MCH: 29.8 pg (ref 26.0–34.0)
MCHC: 32.4 g/dL (ref 30.0–36.0)
MCV: 91.9 fL (ref 78.0–100.0)
MONO ABS: 0.1 10*3/uL (ref 0.1–1.0)
MONOS PCT: 2 % — AB (ref 3–12)
NEUTROS ABS: 4.6 10*3/uL (ref 1.7–7.7)
Neutrophils Relative %: 81 % — ABNORMAL HIGH (ref 43–77)
Platelets: 217 10*3/uL (ref 150–400)
RBC: 3.69 MIL/uL — ABNORMAL LOW (ref 3.87–5.11)
RDW: 13.7 % (ref 11.5–15.5)
WBC: 5.7 10*3/uL (ref 4.0–10.5)

## 2014-10-01 LAB — SAMPLE TO BLOOD BANK

## 2014-10-01 LAB — POC OCCULT BLOOD, ED: FECAL OCCULT BLD: POSITIVE — AB

## 2014-10-01 MED ORDER — MORPHINE SULFATE 2 MG/ML IJ SOLN
2.0000 mg | Freq: Once | INTRAMUSCULAR | Status: AC
  Administered 2014-10-01: 2 mg via INTRAVENOUS

## 2014-10-01 MED ORDER — METFORMIN HCL 500 MG PO TABS
500.0000 mg | ORAL_TABLET | Freq: Every day | ORAL | Status: DC
  Administered 2014-10-02: 500 mg via ORAL
  Filled 2014-10-01 (×2): qty 1

## 2014-10-01 MED ORDER — LOSARTAN POTASSIUM 50 MG PO TABS
50.0000 mg | ORAL_TABLET | Freq: Every day | ORAL | Status: DC
Start: 2014-10-01 — End: 2014-10-02
  Administered 2014-10-02: 50 mg via ORAL
  Filled 2014-10-01 (×2): qty 1

## 2014-10-01 MED ORDER — SODIUM CHLORIDE 0.9 % IV SOLN
INTRAVENOUS | Status: DC
  Administered 2014-10-01: 22:00:00 via INTRAVENOUS

## 2014-10-01 MED ORDER — LEVOFLOXACIN IN D5W 500 MG/100ML IV SOLN
500.0000 mg | Freq: Once | INTRAVENOUS | Status: AC
  Administered 2014-10-01: 500 mg via INTRAVENOUS
  Filled 2014-10-01: qty 100

## 2014-10-01 MED ORDER — MIRABEGRON ER 25 MG PO TB24
25.0000 mg | ORAL_TABLET | Freq: Every morning | ORAL | Status: DC
  Administered 2014-10-02: 25 mg via ORAL
  Filled 2014-10-01: qty 1

## 2014-10-01 MED ORDER — SODIUM CHLORIDE 0.9 % IV BOLUS (SEPSIS)
1000.0000 mL | Freq: Once | INTRAVENOUS | Status: AC
  Administered 2014-10-01: 1000 mL via INTRAVENOUS

## 2014-10-01 MED ORDER — POLYETHYLENE GLYCOL 3350 17 G PO PACK
17.0000 g | PACK | Freq: Every day | ORAL | Status: DC | PRN
  Filled 2014-10-01: qty 1

## 2014-10-01 MED ORDER — LEVOTHYROXINE SODIUM 25 MCG PO TABS
25.0000 ug | ORAL_TABLET | Freq: Every day | ORAL | Status: DC
  Administered 2014-10-02: 25 ug via ORAL
  Filled 2014-10-01 (×2): qty 1

## 2014-10-01 MED ORDER — ROPINIROLE HCL 1 MG PO TABS
5.0000 mg | ORAL_TABLET | Freq: Every day | ORAL | Status: DC
  Administered 2014-10-01: 5 mg via ORAL
  Filled 2014-10-01 (×2): qty 5

## 2014-10-01 MED ORDER — ENSURE COMPLETE PO LIQD: 237.0000 mL | Freq: Three times a day (TID) | ORAL | Status: DC

## 2014-10-01 MED ORDER — RIVAROXABAN 10 MG PO TABS
10.0000 mg | ORAL_TABLET | Freq: Every day | ORAL | Status: DC
  Administered 2014-10-02: 10 mg via ORAL
  Filled 2014-10-01 (×2): qty 1

## 2014-10-01 MED ORDER — FENTANYL CITRATE 0.05 MG/ML IJ SOLN
50.0000 ug | Freq: Once | INTRAMUSCULAR | Status: AC
  Administered 2014-10-01: 50 ug via INTRAVENOUS
  Filled 2014-10-01: qty 2

## 2014-10-01 MED ORDER — ONDANSETRON HCL 4 MG/2ML IJ SOLN
4.0000 mg | Freq: Once | INTRAMUSCULAR | Status: AC
  Administered 2014-10-01: 4 mg via INTRAVENOUS
  Filled 2014-10-01: qty 2

## 2014-10-01 MED ORDER — PANTOPRAZOLE SODIUM 40 MG PO TBEC
40.0000 mg | DELAYED_RELEASE_TABLET | Freq: Every day | ORAL | Status: DC
  Administered 2014-10-02: 40 mg via ORAL
  Filled 2014-10-01: qty 1

## 2014-10-01 MED ORDER — OXYCODONE HCL 5 MG PO TABS
10.0000 mg | ORAL_TABLET | Freq: Four times a day (QID) | ORAL | Status: DC | PRN
  Administered 2014-10-02 (×2): 10 mg via ORAL
  Filled 2014-10-01 (×2): qty 2

## 2014-10-01 MED ORDER — MORPHINE SULFATE 2 MG/ML IJ SOLN
INTRAMUSCULAR | Status: AC
  Filled 2014-10-01: qty 1

## 2014-10-01 MED ORDER — ONDANSETRON HCL 4 MG PO TABS: 4.0000 mg | ORAL_TABLET | Freq: Four times a day (QID) | ORAL | Status: DC | PRN

## 2014-10-01 MED ORDER — MIRTAZAPINE 15 MG PO TABS
7.5000 mg | ORAL_TABLET | Freq: Every day | ORAL | Status: DC
  Filled 2014-10-01 (×2): qty 0.5

## 2014-10-01 NOTE — H&P (Signed)
[facility] History and Physical  DARSEY DIFFIN [id number] DOB: 1919-06-05 DOA: 10/01/2014  Referring physician: ED physician PCP: Benito Mccreedy, MD  Specialists:   Chief Complaint: Abdominal pain for 2 days duration  HPI: [name] is a 80 y.o. female with medical history significant for bilateral lower limb DVT on chronic anticoagulation with Xarelto, hypertension, diabetes, overactive bladder, GERD, duodenitis, diverticulosis presented to the ED today with major complaints of abdominal pain mostly around the umbilicus and epigastric region for 2 days, she also had some diarrhea with dark colored stool. She could not rate the pain, just described as pain, associated with nausea and vomiting. She denies any fever, no blurry vision. She lives at home alone. She has a neighbor who does have grocery shopping, she does not cook because she is not able to stand for long time due to bilateral lower limb DVT. She ambulates with a walker. She had positive stool occult blood in the ED.  General: The patient denies anorexia, fever, weight loss Cardiac: Denies chest pain, syncope, palpitations, pedal edema  Respiratory: Denies cough, shortness of breath, wheezing GI: As above GU: Denies hematuria, incontinence, dysuria  Musculoskeletal: Denies arthritis  Skin: Denies suspicious skin lesions Neurologic: Denies focal weakness or numbness, change in vision  Past Medical History  Diagnosis Date  . Cyst and pseudocyst of pancreas   . Unspecified essential hypertension   . Other and unspecified hyperlipidemia   . Polymyalgia rheumatica   . Personal history of unspecified digestive disease   . Restless legs syndrome (RLS)   . Insomnia, unspecified   . Other malaise and fatigue   . Diverticulosis of colon (without mention of hemorrhage)   . Acute gastritis without mention of hemorrhage   . Duodenitis without mention of hemorrhage   . Diaphragmatic hernia without mention of  obstruction or gangrene   . Esophageal reflux   . Stricture and stenosis of esophagus   . OAB (overactive bladder)   . Left leg DVT jan 2013  . Unspecified sleep apnea     hx of sleep apnea, none since weight loss  . Diabetes mellitus   . Anemia   . Thyroid disease     hypothyroidism  . CAP (community acquired pneumonia) 04/03/2014  . Arthritis   . Hx pulmonary embolism     Past Surgical History  Procedure Laterality Date  . Oophorectomy  1944    not sure which ovary  . Rotator cuff repair  right     4-5 yrs ago  . Eye surgery      cataracts removed-bilateral eyes  . Back surgery  2009    lower back  . Colon surgery  June 17, 2012    surgery for bleed  . Shoulder open rotator cuff repair  08/04/2012    Procedure: ROTATOR CUFF REPAIR SHOULDER OPEN;  Surgeon: Tobi Bastos, MD;  Location: [facility];  Service: Orthopedics;  Laterality: Left;  with Anchors and Graft  . Reduction mammaplasty Bilateral 1980    Social History: She does not smoke cigarettes, she does not drink alcohol. Lives at home     Allergies  Allergen Reactions  . Sulfa Antibiotics Nausea And Vomiting  . Penicillins Hives and Rash    Family History  Problem Relation Age of Onset  . Melanoma Daughter     died age 80  . Melanoma Son       Prior to Admission medications   Medication Sig Start Date End Date Taking? Authorizing Provider  acetaminophen (TYLENOL) 325 MG tablet Take 2 tablets (650 mg total) by mouth every 4 (four) hours as needed for mild pain, fever or headache (or Fever >/= 101). 04/06/14  Yes Robbie Lis, MD  cetirizine (ZYRTEC) 10 MG tablet Take 10 mg by mouth daily.    Historical Provider, MD  chlorpheniramine-HYDROcodone (TUSSIONEX) 10-8 MG/5ML LQCR Take 5 mLs by mouth every 12 (twelve) hours. 04/06/14   Robbie Lis, MD  cyanocobalamin (,VITAMIN B-12,) 1000 MCG/ML injection Inject 1,000 mcg into the muscle every 30 (thirty) days.    Historical Provider, MD  feeding supplement,  ENSURE COMPLETE, (ENSURE COMPLETE) LIQD Take 237 mLs by mouth 3 (three) times daily with meals. 04/06/14   Robbie Lis, MD  levofloxacin (LEVAQUIN) 750 MG tablet Take 1 tablet (750 mg total) by mouth daily. 04/06/14   Robbie Lis, MD  levothyroxine (SYNTHROID, LEVOTHROID) 25 MCG tablet Take 1 tablet (25 mcg total) by mouth daily before breakfast. 04/06/14   Robbie Lis, MD  losartan (COZAAR) 50 MG tablet Take 50 mg by mouth daily.    Historical Provider, MD  metFORMIN (GLUCOPHAGE) 500 MG tablet Take 1 tablet (500 mg total) by mouth daily with breakfast. 04/06/14   Robbie Lis, MD  mirabegron ER (MYRBETRIQ) 25 MG TB24 tablet Take 25 mg by mouth every morning.    Historical Provider, MD  mirtazapine (REMERON) 7.5 MG tablet Take 7.5 mg by mouth at bedtime.    Historical Provider, MD  ondansetron (ZOFRAN) 4 MG tablet Take 1 tablet (4 mg total) by mouth every 6 (six) hours as needed for nausea. 04/06/14   Robbie Lis, MD  oxyCODONE (OXY IR/ROXICODONE) 5 MG immediate release tablet Take 2 tablets (10 mg total) by mouth every 6 (six) hours as needed for moderate pain. 04/06/14   Robbie Lis, MD  pantoprazole (PROTONIX) 40 MG tablet Take 1 tablet (40 mg total) by mouth daily. 04/06/14   Robbie Lis, MD  polyethylene glycol [facility] / Floria Raveling) packet Take 17 g by mouth daily as needed for moderate constipation. 04/06/14   Robbie Lis, MD  PRESCRIPTION MEDICATION Antibiotic shot - name unknown at Dr. Doristine Section -Bonsu    Historical Provider, MD  rivaroxaban (XARELTO) 10 MG TABS tablet Take 10 mg by mouth daily.    Historical Provider, MD  ropinirole (REQUIP) 5 MG tablet Take 5 mg by mouth at bedtime.    Historical Provider, MD  senna-docusate (SENOKOT-S) 8.6-50 MG per tablet Take 2 tablets by mouth every morning.    Historical Provider, MD  VOLTAREN 1 % GEL Apply 2 g topically 2 (two) times daily as needed (for pain). On back 07/19/13   Historical Provider, MD     Physical Exam: Filed Vitals:    10/01/14 1930 10/01/14 1939 10/01/14 1945 10/01/14 2000  BP: 167/77 162/66 171/57 163/60  Pulse:  65  82  Temp:  97.9 F (36.6 C)    TempSrc:  Oral    Resp:  18    SpO2:    95%     General: Elderly woman, not in obvious respiratory distress, afebrile HEENT: Normocephalic and Atraumatic, Mucous membranes pink                PERRLA; EOM intact; No scleral icterus,                 Nares: Patent, Oropharynx: Clear, Fair Dentition  Neck: FROM, no cervical lymphadenopathy, thyromegaly, carotid bruit or JVD;  Breasts: deferred CHEST WALL: No tenderness  CHEST: Normal respiration, clear to auscultation bilaterally  HEART: Regular rate and rhythm; no murmurs rubs or gallops  BACK: No kyphosis or scoliosis; no CVA tenderness  ABDOMEN: Mild to moderate epigastric and periumbilical tenderness, bowel sounds active Rectal Exam: deferred. Blood was positive in the ED EXTREMITIES: No cyanosis, clubbing, or edema Genitalia: not examined  SKIN:  no rash or ulceration  CNS: Alert and Oriented x 4, Nonfocal exam, CN 2-12 intact  Labs on Admission:  Basic Metabolic Panel:  Recent Labs Lab 10/01/14 1744  NA 139  K 4.0  CL 101  CO2 25  GLUCOSE 145*  BUN 26*  CREATININE 0.81  CALCIUM 9.2   Liver Function Tests:  Recent Labs Lab 10/01/14 1744  AST 28  ALT 18  ALKPHOS 92  BILITOT 0.3  PROT 6.7  ALBUMIN 3.5    Recent Labs Lab 10/01/14 1744  LIPASE 22   No results found for this basename: AMMONIA,  in the last 168 hours CBC:  Recent Labs Lab 10/01/14 1744  WBC 5.7  NEUTROABS 4.6  HGB 11.0*  HCT 33.9*  MCV 91.9  PLT 217   Cardiac Enzymes: No results found for this basename: CKTOTAL, CKMB, CKMBINDEX, TROPONINI,  in the last 168 hours  BNP (last 3 results) No results found for this basename: PROBNP,  in the last 8760 hours CBG: No results found for this basename: GLUCAP,  in the last 168 hours  Radiological Exams on Admission: Ct Abdomen Pelvis Wo  Contrast  10/01/2014   CLINICAL DATA:  Lower abdominal pain. Nausea and vomiting. Reduced appetite. Recent diagnosis bladder infection.  EXAM: CT ABDOMEN AND PELVIS WITHOUT CONTRAST  TECHNIQUE: Multidetector CT imaging of the abdomen and pelvis was performed following the standard protocol without IV contrast.  COMPARISON:  Multiple exams, including 12/24/2013  FINDINGS: Lower chest: Mild atelectasis in the left lower lobe and right middle lobe. Dense calcification of the mitral valve. Moderate-sized hiatal hernia.  Hepatobiliary: Stable nonspecific hypodense 5 mm lesion in the dome of the right hepatic lobe, image 9 series 2 no change from 2009 cell highly likely to be benign. Mild gallbladder wall thickening.  Pancreas: Unremarkable for age.  Spleen: Unremarkable  Adrenals/Urinary Tract: Unremarkable  Stomach/Bowel: Mildly dilated small bowel loops in the left abdomen up to 3.5 cm, with scattered air-fluid levels. Distal small bowel loops are not dilated. Well-defined transition point not seen. Prominent sigmoid colon diverticulosis. Difficult to exclude diverticulitis given the low grade diffuse mesenteric edema. Appendix not well visualized.  Vascular/Lymphatic: Aortoiliac atherosclerotic vascular disease.  Reproductive: No significant abnormality identified.  Other: No supplemental non-categorized findings.  Musculoskeletal: Levoconvex lumbar scoliosis. Pedicle screws with apparent radiolucent posterolateral rods at L4-L5-S1. Lumbar vertebral hemangiomatosis. Anterior subluxation at L3-4 and solid fused anterior subluxation at L4-5.  IMPRESSION: 1. Mildly dilated proximal loops of small bowel, technically nonspecific. I do not see an obvious lead point for obstruction. Differential diagnostic considerations include early small bowel obstruction or enteritis/proximal ileus. 2. Severe sigmoid colon diverticulosis. There is generalized ill definition of the mesentery making it difficult to confidently exclude  mild diverticulitis. 3.  Aortoiliac atherosclerotic vascular disease. 4. Lumbar scoliosis. 5. Benign hypodense lesion in the dome of the right hepatic lobe, stable from 2009. 6. Dense calcification of the mitral valve. 7. Moderate-sized hiatal hernia.   Electronically Signed   By: Sherryl Barters M.D.   On: 10/01/2014 18:02  Dg Chest 1 View  10/01/2014   CLINICAL DATA:  Cough today and congestion.  EXAM: CHEST - 1 VIEW  COMPARISON:  04/05/2014, 04/03/2014.  FINDINGS: Multiple skin folds project over the chest bilaterally. Stable mild cardiomegaly with mild mitral anulus calcification and heavy atherosclerotic calcification of the thoracic aorta. Pulmonary vascularity is normal. The lungs appear clear. No visible pneumothorax or pleural effusion. There are several healing versus remote left-sided rib fractures including the fifth, sixth, and seventh ribs. No acute bony abnormality is identified.  IMPRESSION: No acute cardiopulmonary disease.   Electronically Signed   By: Curlene Dolphin M.D.   On: 10/01/2014 17:37    EKG: Independently reviewed.  Assessment/Plan Principal Problem:   Abdominal pain, epigastric Active Problems:   RESTLESS LEG SYNDROME   Diverticulosis of large intestine   DVT of leg (deep venous thrombosis)   Hypothyroidism   Hx pulmonary embolism   Enteritis  Abdominal pain: CT abdomen shows possibility of early small bowel obstruction, or enteritis and possibility of diverticulitis  Will make patient nothing by mouth  IV fluid  Will start IV Cipro and Flagyl for possible diverticulitis  IV morphine for pain  Input and output  Monitor vitals closely  GERD  - continue protonix   INSOMNIA  - continue remeron   Overactive bladder  - continue myrbetriq   DVT of leg (deep venous thrombosis)  - continue xarelto   History of diabetes  Monitor glucose level Continue home medication, kidney function is normal  RESTLESS LEG SYNDROME  - continue  requip   Consulted: None  Code Status: Full code Family Communication: No family member at the bedside DVT Prophylaxis: Continue Xarelto, home medication  Time s supplementapent: 30  Kaden Daughdrill, MD [facility]  If 7PM-7AM, please contact night-coverage [email or web address] 10/01/2014, 11:41 PM

## 2014-10-01 NOTE — ED Notes (Signed)
Pt c/o lower abdominal pain, N/V/D onset Friday. Decreased appetite, states all she has been able to eat over the past couple days is pimiento cheese. Pt seen at urgent care and was told she had a bladder infection.

## 2014-10-01 NOTE — ED Provider Notes (Signed)
CSN: [id number]     Arrival date & time 10/01/14  1604 History   First MD Initiated Contact with Patient 10/01/14 1620     Chief Complaint  Patient presents with  . Abdominal Pain   Level V caveat due to age  (Consider location/radiation/quality/duration/timing/severity/associated sxs/prior Treatment) The history is provided by the spouse.   Patient states 2 days ago she started getting upper abdominal pain that lasted most the day however it was gone yesterday.  She states it restarted again today.  She puts her hand in her epigastric area to indicate where she hurts.  She can only describe the pain as a "pain".  She has had nausea and vomiting.  She started having diarrhea 2 days ago.  She only had one episode today.  She had several yesterday.  She does report her stools and black since she started having the pain 2 days ago.  She denies any fever.   PCP Dr Gwenith Daily  Past Medical History  Diagnosis Date  . Cyst and pseudocyst of pancreas   . Unspecified essential hypertension   . Other and unspecified hyperlipidemia   . Polymyalgia rheumatica   . Personal history of unspecified digestive disease   . Restless legs syndrome (RLS)   . Insomnia, unspecified   . Other malaise and fatigue   . Diverticulosis of colon (without mention of hemorrhage)   . Acute gastritis without mention of hemorrhage   . Duodenitis without mention of hemorrhage   . Diaphragmatic hernia without mention of obstruction or gangrene   . Esophageal reflux   . Stricture and stenosis of esophagus   . OAB (overactive bladder)   . Left leg DVT jan 2013  . Unspecified sleep apnea     hx of sleep apnea, none since weight loss  . Diabetes mellitus   . Anemia   . Thyroid disease     hypothyroidism  . CAP (community acquired pneumonia) 04/03/2014  . Arthritis   . Hx pulmonary embolism    Past Surgical History  Procedure Laterality Date  . Oophorectomy  1944    not sure which ovary  . Rotator cuff repair   right     4-5 yrs ago  . Eye surgery      cataracts removed-bilateral eyes  . Back surgery  2009    lower back  . Colon surgery  June 17, 2012    surgery for bleed  . Shoulder open rotator cuff repair  08/04/2012    Procedure: ROTATOR CUFF REPAIR SHOULDER OPEN;  Surgeon: Tobi Bastos, MD;  Location: [facility];  Service: Orthopedics;  Laterality: Left;  with Anchors and Graft  . Reduction mammaplasty Bilateral 1980   Family History  Problem Relation Age of Onset  . Melanoma Daughter     died age 33  . Melanoma Son    History  Substance Use Topics  . Smoking status: Never Smoker   . Smokeless tobacco: Never Used  . Alcohol Use: No   Pt lives at home Pt lives alone Uses a cane Has a caregiver come in twice a week  OB History   Grav Para Term Preterm Abortions TAB SAB Ect Mult Living                 Review of Systems  All other systems reviewed and are negative.     Allergies  Sulfa antibiotics and Penicillins  Home Medications   Prior to Admission medications   Medication Sig Start Date End  Date Taking? Authorizing Provider  acetaminophen (TYLENOL) 325 MG tablet Take 2 tablets (650 mg total) by mouth every 4 (four) hours as needed for mild pain, fever or headache (or Fever >/= 101). 04/06/14  Yes Robbie Lis, MD  cyanocobalamin (,VITAMIN B-12,) 1000 MCG/ML injection Inject 1,000 mcg into the muscle every 30 (thirty) days.   Yes Historical Provider, MD  feeding supplement, ENSURE COMPLETE, (ENSURE COMPLETE) LIQD Take 237 mLs by mouth 3 (three) times daily with meals. 04/06/14  Yes Robbie Lis, MD  levothyroxine (SYNTHROID, LEVOTHROID) 25 MCG tablet Take 1 tablet (25 mcg total) by mouth daily before breakfast. 04/06/14  Yes Robbie Lis, MD  losartan (COZAAR) 50 MG tablet Take 50 mg by mouth daily.   Yes Historical Provider, MD  metFORMIN (GLUCOPHAGE) 500 MG tablet Take 1 tablet (500 mg total) by mouth daily with breakfast. 04/06/14  Yes Robbie Lis, MD   mirtazapine (REMERON) 7.5 MG tablet Take 7.5 mg by mouth at bedtime.   Yes Historical Provider, MD  ondansetron (ZOFRAN) 4 MG tablet Take 1 tablet (4 mg total) by mouth every 6 (six) hours as needed for nausea. 04/06/14  Yes Robbie Lis, MD  oxyCODONE (OXY IR/ROXICODONE) 5 MG immediate release tablet Take 2 tablets (10 mg total) by mouth every 6 (six) hours as needed for moderate pain. 04/06/14  Yes Robbie Lis, MD  polyethylene glycol [facility] / Floria Raveling) packet Take 17 g by mouth daily as needed for moderate constipation. 04/06/14  Yes Robbie Lis, MD  rivaroxaban (XARELTO) 10 MG TABS tablet Take 10 mg by mouth daily.   Yes Historical Provider, MD  ropinirole (REQUIP) 5 MG tablet Take 5 mg by mouth at bedtime.   Yes Historical Provider, MD  VOLTAREN 1 % GEL Apply 2 g topically 2 (two) times daily as needed (for pain). On back 07/19/13  Yes Historical Provider, MD  cetirizine (ZYRTEC) 10 MG tablet Take 10 mg by mouth daily.    Historical Provider, MD  levofloxacin (LEVAQUIN) 750 MG tablet Take 1 tablet (750 mg total) by mouth daily. 04/06/14   Robbie Lis, MD  mirabegron ER (MYRBETRIQ) 25 MG TB24 tablet Take 25 mg by mouth every morning.    Historical Provider, MD  pantoprazole (PROTONIX) 40 MG tablet Take 1 tablet (40 mg total) by mouth daily. 04/06/14   Robbie Lis, MD  PRESCRIPTION MEDICATION Antibiotic shot - name unknown at Dr. Doristine Section -Bonsu    Historical Provider, MD  senna-docusate (SENOKOT-S) 8.6-50 MG per tablet Take 2 tablets by mouth every morning.    Historical Provider, MD   BP 152/134  Pulse 63  Temp(Src) 97.6 F (36.4 C) (Oral)  Resp 20  SpO2 100%  Vital signs normal except for hypertension  Physical Exam  Nursing note and vitals reviewed. Constitutional: She is oriented to person, place, and time.  Non-toxic appearance. She does not appear ill. No distress.  Elderly frail female  HENT:  Head: Normocephalic and atraumatic.  Right Ear: External ear normal.   Left Ear: External ear normal.  Nose: Nose normal. No mucosal edema or rhinorrhea.  Mouth/Throat: Oropharynx is clear and moist and mucous membranes are normal. No dental abscesses or uvula swelling.  Eyes: Conjunctivae and EOM are normal. Pupils are equal, round, and reactive to light.  Neck: Normal range of motion and full passive range of motion without pain. Neck supple.  Cardiovascular: Normal rate, regular rhythm and normal heart sounds.  Exam reveals no  gallop and no friction rub.   No murmur heard. Pulmonary/Chest: Effort normal and breath sounds normal. No respiratory distress. She has no wheezes. She has no rhonchi. She has no rales. She exhibits no tenderness and no crepitus.  Abdominal: Soft. Normal appearance and bowel sounds are normal. She exhibits no distension. There is generalized tenderness. There is no rebound and no guarding.  Although patient indicates her pain is epigastric she is tender diffusely in her abdomen.  Musculoskeletal: Normal range of motion. She exhibits no edema and no tenderness.  Moves all extremities well.   Neurological: She is alert and oriented to person, place, and time. She has normal strength. No cranial nerve deficit.  Skin: Skin is warm, dry and intact. No rash noted. No erythema. There is pallor.  Psychiatric: She has a normal mood and affect. Her speech is normal and behavior is normal. Her mood appears not anxious.    ED Course  Procedures (including critical care time)  Medications  fentaNYL (SUBLIMAZE) injection 50 mcg (50 mcg Intravenous Given 10/01/14 1835)  ondansetron (ZOFRAN) injection 4 mg (4 mg Intravenous Given 10/01/14 1834)  levofloxacin (LEVAQUIN) IVPB 500 mg (0 mg Intravenous Stopped 10/01/14 2105)  fentaNYL (SUBLIMAZE) injection 50 mcg (50 mcg Intravenous Given 10/01/14 2054)  morphine 2 MG/ML injection 2 mg (2 mg Intravenous Given 10/01/14 2242)   Pt given IV fluids and pain medications. After review of her UA she was  started on levaquin (PCN allergy).  PT and her GD given her test results. They are agreeable for admission.   19:50 Dr Doreene Burke admit to [facility] Review Results for orders placed during the hospital encounter of 10/01/14  URINALYSIS, ROUTINE W REFLEX MICROSCOPIC      Result Value Ref Range   Color, Urine YELLOW  YELLOW   APPearance CLOUDY (*) CLEAR   Specific Gravity, Urine 1.019  1.005 - 1.030   pH 7.5  5.0 - 8.0   Glucose, UA NEGATIVE  NEGATIVE mg/dL   Hgb urine dipstick MODERATE (*) NEGATIVE   Bilirubin Urine NEGATIVE  NEGATIVE   Ketones, ur 15 (*) NEGATIVE mg/dL   Protein, ur 30 (*) NEGATIVE mg/dL   Urobilinogen, UA 0.2  0.0 - 1.0 mg/dL   Nitrite NEGATIVE  NEGATIVE   Leukocytes, UA TRACE (*) NEGATIVE  CBC WITH DIFFERENTIAL      Result Value Ref Range   WBC 5.7  4.0 - 10.5 K/uL   RBC 3.69 (*) 3.87 - 5.11 MIL/uL   Hemoglobin 11.0 (*) 12.0 - 15.0 g/dL   HCT 33.9 (*) 36.0 - 46.0 %   MCV 91.9  78.0 - 100.0 fL   MCH 29.8  26.0 - 34.0 pg   MCHC 32.4  30.0 - 36.0 g/dL   RDW 13.7  11.5 - 15.5 %   Platelets 217  150 - 400 K/uL   Neutrophils Relative % 81 (*) 43 - 77 %   Neutro Abs 4.6  1.7 - 7.7 K/uL   Lymphocytes Relative 17  12 - 46 %   Lymphs Abs 1.0  0.7 - 4.0 K/uL   Monocytes Relative 2 (*) 3 - 12 %   Monocytes Absolute 0.1  0.1 - 1.0 K/uL   Eosinophils Relative 0  0 - 5 %   Eosinophils Absolute 0.0  0.0 - 0.7 K/uL   Basophils Relative 0  0 - 1 %   Basophils Absolute 0.0  0.0 - 0.1 K/uL  COMPREHENSIVE METABOLIC PANEL  Result Value Ref Range   Sodium 139  137 - 147 mEq/L   Potassium 4.0  3.7 - 5.3 mEq/L   Chloride 101  96 - 112 mEq/L   CO2 25  19 - 32 mEq/L   Glucose, Bld 145 (*) 70 - 99 mg/dL   BUN 26 (*) 6 - 23 mg/dL   Creatinine, Ser 0.81  0.50 - 1.10 mg/dL   Calcium 9.2  8.4 - 10.5 mg/dL   Total Protein 6.7  6.0 - 8.3 g/dL   Albumin 3.5  3.5 - 5.2 g/dL   AST 28  0 - 37 U/L   ALT 18  0 - 35 U/L   Alkaline Phosphatase 92  39 - 117 U/L   Total  Bilirubin 0.3  0.3 - 1.2 mg/dL   GFR calc non Af Amer 61 (*) >90 mL/min   GFR calc Af Amer 70 (*) >90 mL/min   Anion gap 13  5 - 15  LIPASE, BLOOD      Result Value Ref Range   Lipase 22  11 - 59 U/L  URINE MICROSCOPIC-ADD ON      Result Value Ref Range   Squamous Epithelial / LPF RARE  RARE   WBC, UA 7-10  <3 WBC/hpf   RBC / HPF 21-50  <3 RBC/hpf   Bacteria, UA MANY (*) RARE  POC OCCULT BLOOD, ED      Result Value Ref Range   Fecal Occult Bld POSITIVE (*) NEGATIVE  SAMPLE TO BLOOD BANK      Result Value Ref Range   Blood Bank Specimen SAMPLE AVAILABLE FOR TESTING     Sample Expiration 10/04/2014     Laboratory interpretation all normal except hemoccult +     Imaging Review Ct Abdomen Pelvis Wo Contrast  10/01/2014   CLINICAL DATA:  Lower abdominal pain. Nausea and vomiting. Reduced appetite. Recent diagnosis bladder infection.  EXAM: CT ABDOMEN AND PELVIS WITHOUT CONTRAST  TECHNIQUE: Multidetector CT imaging of the abdomen and pelvis was performed following the standard protocol without IV contrast.  COMPARISON:  Multiple exams, including 12/24/2013  FINDINGS: Lower chest: Mild atelectasis in the left lower lobe and right middle lobe. Dense calcification of the mitral valve. Moderate-sized hiatal hernia.  Hepatobiliary: Stable nonspecific hypodense 5 mm lesion in the dome of the right hepatic lobe, image 9 series 2 no change from 2009 cell highly likely to be benign. Mild gallbladder wall thickening.  Pancreas: Unremarkable for age.  Spleen: Unremarkable  Adrenals/Urinary Tract: Unremarkable  Stomach/Bowel: Mildly dilated small bowel loops in the left abdomen up to 3.5 cm, with scattered air-fluid levels. Distal small bowel loops are not dilated. Well-defined transition point not seen. Prominent sigmoid colon diverticulosis. Difficult to exclude diverticulitis given the low grade diffuse mesenteric edema. Appendix not well visualized.  Vascular/Lymphatic: Aortoiliac atherosclerotic  vascular disease.  Reproductive: No significant abnormality identified.  Other: No supplemental non-categorized findings.  Musculoskeletal: Levoconvex lumbar scoliosis. Pedicle screws with apparent radiolucent posterolateral rods at L4-L5-S1. Lumbar vertebral hemangiomatosis. Anterior subluxation at L3-4 and solid fused anterior subluxation at L4-5.  IMPRESSION: 1. Mildly dilated proximal loops of small bowel, technically nonspecific. I do not see an obvious lead point for obstruction. Differential diagnostic considerations include early small bowel obstruction or enteritis/proximal ileus. 2. Severe sigmoid colon diverticulosis. There is generalized ill definition of the mesentery making it difficult to confidently exclude mild diverticulitis. 3.  Aortoiliac atherosclerotic vascular disease. 4. Lumbar scoliosis. 5. Benign hypodense lesion in the dome of the right  hepatic lobe, stable from 2009. 6. Dense calcification of the mitral valve. 7. Moderate-sized hiatal hernia.   Electronically Signed   By: Sherryl Barters M.D.   On: 10/01/2014 18:02   Dg Chest 1 View  10/01/2014   CLINICAL DATA:  Cough today and congestion.  EXAM: CHEST - 1 VIEW  COMPARISON:  04/05/2014, 04/03/2014.  FINDINGS: Multiple skin folds project over the chest bilaterally. Stable mild cardiomegaly with mild mitral anulus calcification and heavy atherosclerotic calcification of the thoracic aorta. Pulmonary vascularity is normal. The lungs appear clear. No visible pneumothorax or pleural effusion. There are several healing versus remote left-sided rib fractures including the fifth, sixth, and seventh ribs. No acute bony abnormality is identified.  IMPRESSION: No acute cardiopulmonary disease.   Electronically Signed   By: Curlene Dolphin M.D.   On: 10/01/2014 17:37     EKG Interpretation None      MDM   Final diagnoses:  Epigastric abdominal pain  Black tarry stools  Cough  Urinary tract infection without hematuria, site  unspecified  Nausea and vomiting, vomiting of unspecified type  Dehydration    Plan admission  Rolland Porter, MD, Alanson Aly, MD 10/01/14 2248

## 2014-10-02 DIAGNOSIS — E43 Unspecified severe protein-calorie malnutrition: Secondary | ICD-10-CM | POA: Diagnosis present

## 2014-10-02 DIAGNOSIS — K5730 Diverticulosis of large intestine without perforation or abscess without bleeding: Secondary | ICD-10-CM

## 2014-10-02 DIAGNOSIS — R1013 Epigastric pain: Secondary | ICD-10-CM

## 2014-10-02 DIAGNOSIS — E119 Type 2 diabetes mellitus without complications: Secondary | ICD-10-CM

## 2014-10-02 HISTORY — DX: Unspecified severe protein-calorie malnutrition: E43

## 2014-10-02 LAB — COMPREHENSIVE METABOLIC PANEL
ALK PHOS: 78 U/L (ref 39–117)
ALT: 13 U/L (ref 0–35)
AST: 24 U/L (ref 0–37)
Albumin: 2.9 g/dL — ABNORMAL LOW (ref 3.5–5.2)
Anion gap: 11 (ref 5–15)
BUN: 22 mg/dL (ref 6–23)
CALCIUM: 8.3 mg/dL — AB (ref 8.4–10.5)
CHLORIDE: 106 meq/L (ref 96–112)
CO2: 22 meq/L (ref 19–32)
Creatinine, Ser: 0.75 mg/dL (ref 0.50–1.10)
GFR calc Af Amer: 82 mL/min — ABNORMAL LOW (ref >90)
GFR, EST NON AFRICAN AMERICAN: 71 mL/min — AB (ref >90)
Glucose, Bld: 103 mg/dL — ABNORMAL HIGH (ref 70–99)
POTASSIUM: 4.2 meq/L (ref 3.7–5.3)
SODIUM: 139 meq/L (ref 137–147)
Total Bilirubin: 0.3 mg/dL (ref 0.3–1.2)
Total Protein: 5.6 g/dL — ABNORMAL LOW (ref 6.0–8.3)

## 2014-10-02 LAB — GLUCOSE, CAPILLARY
Glucose-Capillary: 91 mg/dL (ref 70–99)
Glucose-Capillary: 79 mg/dL (ref 70–99)
Glucose-Capillary: 93 mg/dL (ref 70–99)

## 2014-10-02 LAB — PROTIME-INR
INR: 1.13 (ref 0.00–1.49)
Prothrombin Time: 14.7 seconds (ref 11.6–15.2)

## 2014-10-02 LAB — CBC
HCT: 29 % — ABNORMAL LOW (ref 36.0–46.0)
Hemoglobin: 9.4 g/dL — ABNORMAL LOW (ref 12.0–15.0)
MCH: 29.5 pg (ref 26.0–34.0)
MCHC: 32.4 g/dL (ref 30.0–36.0)
MCV: 90.9 fL (ref 78.0–100.0)
PLATELETS: 210 10*3/uL (ref 150–400)
RBC: 3.19 MIL/uL — AB (ref 3.87–5.11)
RDW: 13.6 % (ref 11.5–15.5)
WBC: 6.8 10*3/uL (ref 4.0–10.5)

## 2014-10-02 LAB — APTT: aPTT: 29 seconds (ref 24–37)

## 2014-10-02 MED ORDER — ENOXAPARIN SODIUM 60 MG/0.6ML ~~LOC~~ SOLN
1.0000 mg/kg | Freq: Two times a day (BID) | SUBCUTANEOUS | Status: DC
  Filled 2014-10-02 (×3): qty 0.6

## 2014-10-02 MED ORDER — MORPHINE SULFATE 2 MG/ML IJ SOLN
2.0000 mg | INTRAMUSCULAR | Status: DC | PRN
  Administered 2014-10-02: 2 mg via INTRAVENOUS
  Filled 2014-10-02: qty 1

## 2014-10-02 MED ORDER — ENOXAPARIN SODIUM 60 MG/0.6ML ~~LOC~~ SOLN
45.0000 mg | Freq: Once | SUBCUTANEOUS | Status: DC
  Filled 2014-10-02: qty 0.6

## 2014-10-02 MED ORDER — METRONIDAZOLE IN NACL 5-0.79 MG/ML-% IV SOLN
500.0000 mg | Freq: Three times a day (TID) | INTRAVENOUS | Status: DC
  Administered 2014-10-02 – 2014-10-07 (×17): 500 mg via INTRAVENOUS
  Filled 2014-10-02 (×19): qty 100

## 2014-10-02 MED ORDER — MORPHINE SULFATE 2 MG/ML IJ SOLN: 2.0000 mg | INTRAMUSCULAR | Status: DC | PRN

## 2014-10-02 MED ORDER — LORAZEPAM 2 MG/ML IJ SOLN
0.5000 mg | Freq: Once | INTRAMUSCULAR | Status: AC
  Administered 2014-10-02: 0.5 mg via INTRAVENOUS
  Filled 2014-10-02: qty 1

## 2014-10-02 MED ORDER — CIPROFLOXACIN IN D5W 400 MG/200ML IV SOLN
400.0000 mg | Freq: Two times a day (BID) | INTRAVENOUS | Status: DC
  Administered 2014-10-02 – 2014-10-07 (×11): 400 mg via INTRAVENOUS
  Filled 2014-10-02 (×12): qty 200

## 2014-10-02 MED ORDER — KCL IN DEXTROSE-NACL 10-5-0.45 MEQ/L-%-% IV SOLN
INTRAVENOUS | Status: DC
  Administered 2014-10-02 – 2014-10-07 (×6): via INTRAVENOUS
  Filled 2014-10-02 (×9): qty 1000

## 2014-10-02 MED ORDER — PANTOPRAZOLE SODIUM 40 MG IV SOLR
40.0000 mg | Freq: Two times a day (BID) | INTRAVENOUS | Status: DC
  Administered 2014-10-02 – 2014-10-03 (×2): 40 mg via INTRAVENOUS
  Filled 2014-10-02 (×4): qty 40

## 2014-10-02 MED ORDER — SODIUM CHLORIDE 0.9 % IV SOLN: INTRAVENOUS | Status: DC

## 2014-10-02 MED ORDER — ONDANSETRON HCL 4 MG/2ML IJ SOLN
4.0000 mg | Freq: Four times a day (QID) | INTRAMUSCULAR | Status: DC | PRN
  Administered 2014-10-04: 4 mg via INTRAVENOUS
  Filled 2014-10-02: qty 2

## 2014-10-02 MED ORDER — INSULIN ASPART 100 UNIT/ML ~~LOC~~ SOLN: 0.0000 [IU] | Freq: Three times a day (TID) | SUBCUTANEOUS | Status: DC

## 2014-10-02 MED ORDER — ENOXAPARIN SODIUM 60 MG/0.6ML ~~LOC~~ SOLN
45.0000 mg | Freq: Once | SUBCUTANEOUS | Status: AC
  Administered 2014-10-02: 45 mg via SUBCUTANEOUS
  Filled 2014-10-02: qty 0.6

## 2014-10-02 MED ORDER — ONDANSETRON HCL 4 MG PO TABS: 4.0000 mg | ORAL_TABLET | Freq: Four times a day (QID) | ORAL | Status: DC | PRN

## 2014-10-02 NOTE — Progress Notes (Signed)
INITIAL NUTRITION ASSESSMENT  DOCUMENTATION CODES Per approved criteria  -Severe malnutrition in the context of chronic illness  Pt meets criteria for severe MALNUTRITION in the context of chronic illness as evidenced by severe muscle and subcutaneous fat depletion and fluid accumulation.  INTERVENTION:  Diet advancement per MD  When diet is advanced, provide Glucerna Shake po QID, each supplement provides 220 kcal and 10 grams of protein  RD to continue to monitor  NUTRITION DIAGNOSIS: Inadequate oral intake related to abdominal pain as evidenced by loss of muscle mass.   Goal: Pt to meet >/= 90% of their estimated nutrition needs   Monitor:  PO and supplemental intake, weight, labs, I/O's  Reason for Assessment: Pt identified as at nutrition risk on the Malnutrition Screen Tool  Admitting Dx: Abdominal pain, epigastric  ASSESSMENT: 80 y.o. female with medical history significant for bilateral lower limb DVT on chronic anticoagulation with Xarelto, hypertension, diabetes, overactive bladder, GERD, duodenitis, diverticulosis presented to the ED today with major complaints of abdominal pain mostly around the umbilicus and epigastric region for 2 days, she also had some diarrhea with dark colored stool. She could not rate the pain, just described as pain, associated with nausea and vomiting. She has a neighbor who does have grocery shopping, she does not cook because she is not able to stand for long time due to bilateral lower limb DVT.  Pt reports progressive weight loss over 2 years time. UBW of 130 lb, current weight is 103 lb. Per weight history documentation, pt's weight has been stable for the past year. Pt reports adding 4-5 Glucerna shakes into her diet, she states that this has kept her weight steady.   Pt still c/o abdominal pain. She reports decreased appetite and taste changes.  She doesn't feel like eating certain foods that are bland with limited taste. Enjoys foods with  sauces and strong flavors like chicken teriyaki.   RD to order Glucerna shakes QID when diet is advanced. Pt is currently NPO.     Nutrition Focused Physical Exam:  Subcutaneous Fat:  Orbital Region: WNL Upper Arm Region: severe depletion Thoracic and Lumbar Region: NA  Muscle:  Temple Region: mild depletion Clavicle Bone Region: severe depletion Clavicle and Acromion Bone Region: severe depletion Scapular Bone Region: severe depletion Dorsal Hand: moderate depletion Patellar Region: WNL Anterior Thigh Region: WNL Posterior Calf Region: moderate depletion  Edema: +1 LLE edema  Labs reviewed: Glucose 103  Height: Ht Readings from Last 1 Encounters:  08/02/14 5' (1.524 m)    Weight: Wt Readings from Last 1 Encounters:  08/02/14 103 lb (46.72 kg)    Ideal Body Weight: 100 lb  % Ideal Body Weight: 103%  Wt Readings from Last 10 Encounters:  08/02/14 103 lb (46.72 kg)  05/05/14 107 lb 9.6 oz (48.807 kg)  04/07/14 104 lb (47.174 kg)  04/06/14 110 lb 7.2 oz (50.1 kg)  07/29/13 105 lb (47.628 kg)  07/28/13 105 lb (47.628 kg)  05/27/13 114 lb 13.8 oz (52.1 kg)  03/31/13 127 lb (57.607 kg)  02/22/13 134 lb 0.6 oz (60.8 kg)  12/05/12 142 lb 10.2 oz (64.7 kg)    Usual Body Weight: 130 lb -per pt  % Usual Body Weight: 79%  BMI:  20.1  Estimated Nutritional Needs: Kcal: 1400-1600 Protein: 75-85g Fluid: 1.5L/day  Skin: intact  Diet Order: NPO  EDUCATION NEEDS: -No education needs identified at this time   Intake/Output Summary (Last 24 hours) at 10/02/14 1024 Last data filed at 10/01/14  2106  Gross per 24 hour  Intake   1100 ml  Output      0 ml  Net   1100 ml    Last BM: 10/25  Labs:   Recent Labs Lab 10/01/14 1744 10/02/14 0509  NA 139 139  K 4.0 4.2  CL 101 106  CO2 25 22  BUN 26* 22  CREATININE 0.81 0.75  CALCIUM 9.2 8.3*  GLUCOSE 145* 103*    CBG (last 3)  No results found for this basename: GLUCAP,  in the last 72  hours  Scheduled Meds: . ciprofloxacin  400 mg Intravenous Q12H  . feeding supplement (ENSURE COMPLETE)  237 mL Oral TID WC  . levothyroxine  25 mcg Oral QAC breakfast  . losartan  50 mg Oral Daily  . metFORMIN  500 mg Oral Q breakfast  . metronidazole  500 mg Intravenous 3 times per day  . mirabegron ER  25 mg Oral q morning - 10a  . mirtazapine  7.5 mg Oral QHS  . pantoprazole  40 mg Oral Daily  . rivaroxaban  10 mg Oral Daily  . ropinirole  5 mg Oral QHS    Continuous Infusions: . sodium chloride 100 mL/hr at 10/01/14 2222    Past Medical History  Diagnosis Date  . Cyst and pseudocyst of pancreas   . Unspecified essential hypertension   . Other and unspecified hyperlipidemia   . Polymyalgia rheumatica   . Personal history of unspecified digestive disease   . Restless legs syndrome (RLS)   . Insomnia, unspecified   . Other malaise and fatigue   . Diverticulosis of colon (without mention of hemorrhage)   . Acute gastritis without mention of hemorrhage   . Duodenitis without mention of hemorrhage   . Diaphragmatic hernia without mention of obstruction or gangrene   . Esophageal reflux   . Stricture and stenosis of esophagus   . OAB (overactive bladder)   . Left leg DVT jan 2013  . Unspecified sleep apnea     hx of sleep apnea, none since weight loss  . Diabetes mellitus   . Anemia   . Thyroid disease     hypothyroidism  . CAP (community acquired pneumonia) 04/03/2014  . Arthritis   . Hx pulmonary embolism     Past Surgical History  Procedure Laterality Date  . Oophorectomy  1944    not sure which ovary  . Rotator cuff repair  right     4-5 yrs ago  . Eye surgery      cataracts removed-bilateral eyes  . Back surgery  2009    lower back  . Colon surgery  June 17, 2012    surgery for bleed  . Shoulder open rotator cuff repair  08/04/2012    Procedure: ROTATOR CUFF REPAIR SHOULDER OPEN;  Surgeon: Tobi Bastos, MD;  Location: [facility];  Service: Orthopedics;   Laterality: Left;  with Anchors and Graft  . Reduction mammaplasty Bilateral 1980    Clayton Bibles, MS, RD, LDN Pager: [phone] After Hours Pager: [phone]

## 2014-10-02 NOTE — Progress Notes (Addendum)
ANTICOAGULATION CONSULT NOTE - Initial Consult  Pharmacy Consult for Lovenox Indication: h/o pulmonary embolus and DVT  Allergies  Allergen Reactions  . Sulfa Antibiotics Nausea And Vomiting  . Penicillins Hives and Rash    Patient Measurements:  08/02/14: Weight = 46.7kg (current weight pending)   Vital Signs: Temp: 97.9 F (36.6 C) (10/26 0551) Temp Source: Oral (10/26 0551) BP: 145/55 mmHg (10/26 0551) Pulse Rate: 58 (10/26 0551)  Labs:  Recent Labs  10/01/14 1744 10/02/14 0509  HGB 11.0* 9.4*  HCT 33.9* 29.0*  PLT 217 210  APTT  --  29  LABPROT  --  14.7  INR  --  1.13  CREATININE 0.81 0.75    The CrCl is unknown because both a height and weight (above a minimum accepted value) are required for this calculation.   Medical History: Past Medical History  Diagnosis Date  . Cyst and pseudocyst of pancreas   . Unspecified essential hypertension   . Other and unspecified hyperlipidemia   . Polymyalgia rheumatica   . Personal history of unspecified digestive disease   . Restless legs syndrome (RLS)   . Insomnia, unspecified   . Other malaise and fatigue   . Diverticulosis of colon (without mention of hemorrhage)   . Acute gastritis without mention of hemorrhage   . Duodenitis without mention of hemorrhage   . Diaphragmatic hernia without mention of obstruction or gangrene   . Esophageal reflux   . Stricture and stenosis of esophagus   . OAB (overactive bladder)   . Left leg DVT jan 2013  . Unspecified sleep apnea     hx of sleep apnea, none since weight loss  . Diabetes mellitus   . Anemia   . Thyroid disease     hypothyroidism  . CAP (community acquired pneumonia) 04/03/2014  . Arthritis   . Hx pulmonary embolism     Assessment: 76 yoF with PMH significant for bilateral lower limb DVT on chronic anticoagulation with Xarelto.  Pharmacy consulted to dose lovenox for VTE treatment.  Wt = 46.7kg 07/2014 (current weight pending) Renal: CrCl ~ 28ml/min  using SCr of 0.8 Occult blood : positive 10/01/14 PTA Xarelto 10mg  daily (on hold) LD 10/24  Please note: dosing of Xarelto for VTE treatment should be 15mg  BID x 21 days, then 20mg  daily.  Goal of Therapy:  Anticoagulation for patient with h/o DVT/PE Monitor platelets by anticoagulation protocol: Yes   Plan:  Start Lovenox 45mg  subq Q12H Follow CBC, platelets  If plan is to go back home on Xarelto, consider dosing outlined above.  Kizzie Furnish, PharmD Pager: [phone] 10/02/2014 12:53 PM

## 2014-10-02 NOTE — Progress Notes (Addendum)
[facility] PROGRESS NOTE  [name] [id number] DOB: 07/19/1919 DOA: 10/01/2014 PCP: Benito Mccreedy, MD  Assessment/Plan  Abdominal pain due to possible early partial small bowel obstruction, enteritis, ileus, or diverticulitis.  -  Continue NPO with IVF and antiemetics -  Increase morphine for pain -  Will need PT/OT to help with ambulation to assist with resolution -  Repeat abdominal X-rays in the morning -  Hold oral medications/pills for now -  Continue ciprofloxacin/flagyl day 2  Possible UTI -  Continue abx as above -  F/u urine culture (pending)  GERD, stable, continue protonix IV  Overactive bladder, hold myrbetriq  DVT, hold xarelto and use lovenox instead   DM type 2 with somewhat low CBG this evening -  Hold metformin -  Start SSI  -  Start dextrose in IVF and cover with SSI  Insomnia, stable, hold remeron while NPO.  RLS, stable, hold requip while NPO  Severe malnutrition  -  Appreciate nutrition recommendations -  Once diet advanced, start glucerna QID  Diet:  NPO Access:  PIV IVF:  yes Proph:  lovenox  Code Status: full Family Communication: patient and her daughter Disposition Plan: pending improvement in abdominal pain    Consultants:  None  Procedures:  CT abd/pelvis  CXR  Antibiotics:  cipro 10/26 >>    Flagyl 10/26 >>  HPI/Subjective:  Still having severe abdominal pain.  Not passing gas or having BMs.  Feeling sick to stomach but no vomiting.    Objective: Filed Vitals:   10/01/14 2000 10/01/14 2045 10/02/14 0551 10/02/14 1400  BP: 163/60 133/61 145/55 109/51  Pulse: 82 65 58 51  Temp:  97.8 F (36.6 C) 97.9 F (36.6 C) 97.5 F (36.4 C)  TempSrc:  Oral Oral Oral  Resp:  14 14 18   SpO2: 95% 100% 99% 100%    Intake/Output Summary (Last 24 hours) at 10/02/14 1734 Last data filed at 10/02/14 1400  Gross per 24 hour  Intake   1100 ml  Output      0 ml  Net   1100 ml   There were no vitals filed  for this visit.  Exam:   General:  Frail white female, No acute distress  HEENT:  NCAT, MMM  Cardiovascular:  RRR, nl S1, S2 no mrg, 2+ pulses, warm extremities  Respiratory:  CTAB, no increased WOB  Abdomen:   Hypoactive BS, soft, ND, diffusely TTP without rebound or guarding  MSK:   Normal tone and bulk, no LEE  Neuro:  Grossly intact  Data Reviewed: Basic Metabolic Panel:  Recent Labs Lab 10/01/14 1744 10/02/14 0509  NA 139 139  K 4.0 4.2  CL 101 106  CO2 25 22  GLUCOSE 145* 103*  BUN 26* 22  CREATININE 0.81 0.75  CALCIUM 9.2 8.3*   Liver Function Tests:  Recent Labs Lab 10/01/14 1744 10/02/14 0509  AST 28 24  ALT 18 13  ALKPHOS 92 78  BILITOT 0.3 0.3  PROT 6.7 5.6*  ALBUMIN 3.5 2.9*    Recent Labs Lab 10/01/14 1744  LIPASE 22   No results found for this basename: AMMONIA,  in the last 168 hours CBC:  Recent Labs Lab 10/01/14 1744 10/02/14 0509  WBC 5.7 6.8  NEUTROABS 4.6  --   HGB 11.0* 9.4*  HCT 33.9* 29.0*  MCV 91.9 90.9  PLT 217 210   Cardiac Enzymes: No results found for this basename: CKTOTAL, CKMB, CKMBINDEX, TROPONINI,  in the last 168 hours  BNP (last 3 results) No results found for this basename: PROBNP,  in the last 8760 hours CBG:  Recent Labs Lab 10/02/14 1407  GLUCAP 91    No results found for this or any previous visit (from the past 240 hour(s)).   Studies: Ct Abdomen Pelvis Wo Contrast  10/01/2014   CLINICAL DATA:  Lower abdominal pain. Nausea and vomiting. Reduced appetite. Recent diagnosis bladder infection.  EXAM: CT ABDOMEN AND PELVIS WITHOUT CONTRAST  TECHNIQUE: Multidetector CT imaging of the abdomen and pelvis was performed following the standard protocol without IV contrast.  COMPARISON:  Multiple exams, including 12/24/2013  FINDINGS: Lower chest: Mild atelectasis in the left lower lobe and right middle lobe. Dense calcification of the mitral valve. Moderate-sized hiatal hernia.  Hepatobiliary: Stable  nonspecific hypodense 5 mm lesion in the dome of the right hepatic lobe, image 9 series 2 no change from 2009 cell highly likely to be benign. Mild gallbladder wall thickening.  Pancreas: Unremarkable for age.  Spleen: Unremarkable  Adrenals/Urinary Tract: Unremarkable  Stomach/Bowel: Mildly dilated small bowel loops in the left abdomen up to 3.5 cm, with scattered air-fluid levels. Distal small bowel loops are not dilated. Well-defined transition point not seen. Prominent sigmoid colon diverticulosis. Difficult to exclude diverticulitis given the low grade diffuse mesenteric edema. Appendix not well visualized.  Vascular/Lymphatic: Aortoiliac atherosclerotic vascular disease.  Reproductive: No significant abnormality identified.  Other: No supplemental non-categorized findings.  Musculoskeletal: Levoconvex lumbar scoliosis. Pedicle screws with apparent radiolucent posterolateral rods at L4-L5-S1. Lumbar vertebral hemangiomatosis. Anterior subluxation at L3-4 and solid fused anterior subluxation at L4-5.  IMPRESSION: 1. Mildly dilated proximal loops of small bowel, technically nonspecific. I do not see an obvious lead point for obstruction. Differential diagnostic considerations include early small bowel obstruction or enteritis/proximal ileus. 2. Severe sigmoid colon diverticulosis. There is generalized ill definition of the mesentery making it difficult to confidently exclude mild diverticulitis. 3.  Aortoiliac atherosclerotic vascular disease. 4. Lumbar scoliosis. 5. Benign hypodense lesion in the dome of the right hepatic lobe, stable from 2009. 6. Dense calcification of the mitral valve. 7. Moderate-sized hiatal hernia.   Electronically Signed   By: Sherryl Barters M.D.   On: 10/01/2014 18:02   Dg Chest 1 View  10/01/2014   CLINICAL DATA:  Cough today and congestion.  EXAM: CHEST - 1 VIEW  COMPARISON:  04/05/2014, 04/03/2014.  FINDINGS: Multiple skin folds project over the chest bilaterally. Stable mild  cardiomegaly with mild mitral anulus calcification and heavy atherosclerotic calcification of the thoracic aorta. Pulmonary vascularity is normal. The lungs appear clear. No visible pneumothorax or pleural effusion. There are several healing versus remote left-sided rib fractures including the fifth, sixth, and seventh ribs. No acute bony abnormality is identified.  IMPRESSION: No acute cardiopulmonary disease.   Electronically Signed   By: Curlene Dolphin M.D.   On: 10/01/2014 17:37    Scheduled Meds: . ciprofloxacin  400 mg Intravenous Q12H  . [START ON 10/03/2014] enoxaparin (LOVENOX) injection  1 mg/kg (Order-Specific) Subcutaneous Q12H  . enoxaparin (LOVENOX) injection  45 mg Subcutaneous Once  . insulin aspart  0-9 Units Subcutaneous 3 times per day  . metronidazole  500 mg Intravenous 3 times per day  . pantoprazole (PROTONIX) IV  40 mg Intravenous BID   Continuous Infusions: . sodium chloride      Principal Problem:   Abdominal pain, epigastric Active Problems:   RESTLESS LEG SYNDROME   Diverticulosis of large intestine   DVT of leg (deep venous thrombosis)  Hypothyroidism   Hx pulmonary embolism   Enteritis   Protein-calorie malnutrition, severe    Time spent: 30 min    Jerilynn Feldmeier, [place] [facility] Pager [phone]. If 7PM-7AM, please contact night-coverage at [email or web address], password [facility] 10/02/2014, 5:34 PM  LOS: 1 day

## 2014-10-02 NOTE — Progress Notes (Signed)
Spoke to admitting MD about foley order. MD ok to hold off on inserting foley as patient is able to void on her own.

## 2014-10-03 ENCOUNTER — Inpatient Hospital Stay (HOSPITAL_COMMUNITY): Payer: Medicare Other

## 2014-10-03 LAB — BASIC METABOLIC PANEL
Anion gap: 11 (ref 5–15)
BUN: 19 mg/dL (ref 6–23)
CHLORIDE: 105 meq/L (ref 96–112)
CO2: 22 mEq/L (ref 19–32)
CREATININE: 0.95 mg/dL (ref 0.50–1.10)
Calcium: 8.5 mg/dL (ref 8.4–10.5)
GFR calc Af Amer: 58 mL/min — ABNORMAL LOW (ref >90)
GFR, EST NON AFRICAN AMERICAN: 50 mL/min — AB (ref >90)
Glucose, Bld: 103 mg/dL — ABNORMAL HIGH (ref 70–99)
Potassium: 3.9 mEq/L (ref 3.7–5.3)
Sodium: 138 mEq/L (ref 137–147)

## 2014-10-03 LAB — CBC
HCT: 31.2 % — ABNORMAL LOW (ref 36.0–46.0)
HEMOGLOBIN: 10.2 g/dL — AB (ref 12.0–15.0)
MCH: 30 pg (ref 26.0–34.0)
MCHC: 32.7 g/dL (ref 30.0–36.0)
MCV: 91.8 fL (ref 78.0–100.0)
Platelets: 175 10*3/uL (ref 150–400)
RBC: 3.4 MIL/uL — AB (ref 3.87–5.11)
RDW: 13.9 % (ref 11.5–15.5)
WBC: 5.1 10*3/uL (ref 4.0–10.5)

## 2014-10-03 LAB — GLUCOSE, CAPILLARY
GLUCOSE-CAPILLARY: 100 mg/dL — AB (ref 70–99)
GLUCOSE-CAPILLARY: 136 mg/dL — AB (ref 70–99)
Glucose-Capillary: 108 mg/dL — ABNORMAL HIGH (ref 70–99)

## 2014-10-03 LAB — LACTIC ACID, PLASMA: Lactic Acid, Venous: 1.2 mmol/L (ref 0.5–2.2)

## 2014-10-03 MED ORDER — MORPHINE SULFATE 2 MG/ML IJ SOLN: 2.0000 mg | INTRAMUSCULAR | Status: DC | PRN

## 2014-10-03 MED ORDER — GLUCERNA SHAKE PO LIQD
237.0000 mL | Freq: Three times a day (TID) | ORAL | Status: DC
  Administered 2014-10-03 – 2014-10-09 (×11): 237 mL via ORAL
  Filled 2014-10-03 (×29): qty 237

## 2014-10-03 MED ORDER — PANTOPRAZOLE SODIUM 40 MG PO TBEC
40.0000 mg | DELAYED_RELEASE_TABLET | Freq: Two times a day (BID) | ORAL | Status: DC
  Administered 2014-10-03: 40 mg via ORAL
  Filled 2014-10-03 (×3): qty 1

## 2014-10-03 MED ORDER — LORAZEPAM 2 MG/ML IJ SOLN
1.0000 mg | Freq: Once | INTRAMUSCULAR | Status: AC
  Administered 2014-10-03: 1 mg via INTRAVENOUS

## 2014-10-03 MED ORDER — GLYCERIN (LAXATIVE) 2.1 G RE SUPP
1.0000 | Freq: Once | RECTAL | Status: AC
  Administered 2014-10-03: 1 via RECTAL
  Filled 2014-10-03 (×2): qty 1

## 2014-10-03 MED ORDER — MIRTAZAPINE 7.5 MG PO TABS
7.5000 mg | ORAL_TABLET | Freq: Every day | ORAL | Status: DC
  Administered 2014-10-03 – 2014-10-08 (×6): 7.5 mg via ORAL
  Filled 2014-10-03 (×7): qty 1

## 2014-10-03 MED ORDER — LORAZEPAM 2 MG/ML IJ SOLN
INTRAMUSCULAR | Status: AC
Start: 2014-10-03 — End: 2014-10-03
  Filled 2014-10-03: qty 1

## 2014-10-03 MED ORDER — ENOXAPARIN SODIUM 60 MG/0.6ML ~~LOC~~ SOLN
1.0000 mg/kg | SUBCUTANEOUS | Status: DC
  Administered 2014-10-03 – 2014-10-08 (×6): 50 mg via SUBCUTANEOUS
  Filled 2014-10-03 (×7): qty 0.6

## 2014-10-03 MED ORDER — ACETAMINOPHEN 325 MG PO TABS
650.0000 mg | ORAL_TABLET | Freq: Four times a day (QID) | ORAL | Status: DC | PRN
  Administered 2014-10-04 – 2014-10-07 (×3): 650 mg via ORAL
  Filled 2014-10-03 (×3): qty 2

## 2014-10-03 MED ORDER — LEVOTHYROXINE SODIUM 25 MCG PO TABS
25.0000 ug | ORAL_TABLET | Freq: Every day | ORAL | Status: DC
  Administered 2014-10-05 – 2014-10-09 (×5): 25 ug via ORAL
  Filled 2014-10-03 (×7): qty 1

## 2014-10-03 MED ORDER — LOSARTAN POTASSIUM 50 MG PO TABS: 50.0000 mg | ORAL_TABLET | Freq: Every day | ORAL | Status: DC

## 2014-10-03 MED ORDER — ROPINIROLE HCL 1 MG PO TABS
5.0000 mg | ORAL_TABLET | Freq: Every day | ORAL | Status: DC
  Administered 2014-10-03 – 2014-10-08 (×6): 5 mg via ORAL
  Filled 2014-10-03 (×7): qty 5

## 2014-10-03 MED ORDER — DOCUSATE SODIUM 100 MG PO CAPS
100.0000 mg | ORAL_CAPSULE | Freq: Two times a day (BID) | ORAL | Status: DC
  Administered 2014-10-03 (×2): 100 mg via ORAL
  Filled 2014-10-03 (×4): qty 1

## 2014-10-03 MED ORDER — OXYCODONE HCL 5 MG PO TABS
5.0000 mg | ORAL_TABLET | ORAL | Status: DC | PRN
  Administered 2014-10-03: 10 mg via ORAL
  Administered 2014-10-03: 5 mg via ORAL
  Administered 2014-10-04 (×2): 10 mg via ORAL
  Administered 2014-10-05 – 2014-10-06 (×2): 5 mg via ORAL
  Filled 2014-10-03 (×2): qty 1
  Filled 2014-10-03: qty 2
  Filled 2014-10-03: qty 1
  Filled 2014-10-03 (×2): qty 2

## 2014-10-03 MED ORDER — ENOXAPARIN SODIUM 60 MG/0.6ML ~~LOC~~ SOLN
1.0000 mg/kg | Freq: Two times a day (BID) | SUBCUTANEOUS | Status: DC
  Filled 2014-10-03 (×2): qty 0.6

## 2014-10-03 MED ORDER — INSULIN ASPART 100 UNIT/ML ~~LOC~~ SOLN
0.0000 [IU] | Freq: Three times a day (TID) | SUBCUTANEOUS | Status: DC
  Administered 2014-10-03: 1 [IU] via SUBCUTANEOUS
  Administered 2014-10-05 – 2014-10-07 (×3): 2 [IU] via SUBCUTANEOUS
  Administered 2014-10-08: 1 [IU] via SUBCUTANEOUS
  Administered 2014-10-08: 2 [IU] via SUBCUTANEOUS

## 2014-10-03 MED ORDER — POLYETHYLENE GLYCOL 3350 17 G PO PACK
17.0000 g | PACK | Freq: Two times a day (BID) | ORAL | Status: DC
  Administered 2014-10-03 – 2014-10-05 (×2): 17 g via ORAL
  Filled 2014-10-03 (×10): qty 1

## 2014-10-03 NOTE — Progress Notes (Signed)
Occupational Therapy Evaluation Patient Details Name: ADALAYA BYRNES MRN: [id number] DOB: 1919-01-26 Today's Date: 10/03/2014    History of Present Illness 80 yo female admitted with SBO, abd pain. Hx of bil LE, DVT, HTN, DM, polymyalgia rheumatica,RLS, PE. Pt is home alone with a caregiver 3x/week   Clinical Impression   PTA, pt mod i with mobility and ADL @ RW level. Pt with good insight into safety and states that she can arrange for her caregiver to be with her dialy if needed. At this time, rec pt D/C home with daily contact with caregiver intitially with [place]. Will follow acutely to address established goals.     Follow Up Recommendations  Home health OT;Supervision - Intermittent    Equipment Recommendations  None recommended by OT    Recommendations for Other Services       Precautions / Restrictions Precautions Precautions: Fall Restrictions Weight Bearing Restrictions: No      Mobility Bed Mobility Overal bed mobility: Modified Independent               Transfers Overall transfer level: Needs assistance Equipment used: Rolling walker (2 wheeled) Transfers: Sit to/from [employer] Transfers Sit to Stand: Supervision Stand pivot transfers: Supervision           Balance Overall balance assessment: Needs assistance   Sitting balance-Leahy Scale: Good     Standing balance support: During functional activity Standing balance-Leahy Scale: Fair                              ADL Overall ADL's : Needs assistance/impaired Eating/Feeding: Independent   Grooming: Set up;Sitting   Upper Body Bathing: Set up;Sitting   Lower Body Bathing: Supervison/ safety;Set up;Sit to/from stand   Upper Body Dressing : Set up;Sitting   Lower Body Dressing: Supervision/safety;Set up;Sit to/from stand   Toilet Transfer: Supervision/safety;Ambulation;RW   Toileting- Clothing Manipulation and Hygiene: Modified independent;Sit to/from stand        Functional mobility during ADLs: Supervision/safety;Rolling walker General ADL Comments: Pt has caregiver who can provide assistanc.e Pt aware of safety and fall risk. Good safety/judgement     Vision                     Perception     Praxis      Pertinent Vitals/Pain Pain Assessment: No/denies pain     Hand Dominance Right   Extremity/Trunk Assessment Upper Extremity Assessment Upper Extremity Assessment: Generalized weakness   Lower Extremity Assessment Lower Extremity Assessment: Generalized weakness   Cervical / Trunk Assessment Cervical / Trunk Assessment: Kyphotic   Communication Communication Communication: HOH   Cognition Arousal/Alertness: Awake/alert Behavior During Therapy: WFL for tasks assessed/performed Overall Cognitive Status: Within Functional Limits for tasks assessed                     General Comments       Exercises       Shoulder Instructions      Home Living Family/patient expects to be discharged to:: Private residence Living Arrangements: Alone Available Help at Discharge: Friend(s);Available PRN/intermittently Type of Home:  (condo) Home Access: Stairs to enter [employer] of Steps: 5 Entrance Stairs-Rails: Right;Left;Can reach both Home Layout: One level     Bathroom Shower/Tub: Tub/shower unit Shower/tub characteristics: Curtain Biochemist, clinical: Handicapped height Bathroom Accessibility: Yes How Accessible: Accessible via walker Home Equipment: Latina Craver - 2 wheels;Walker - 4 wheels;Grab bars -  tub/shower;Shower seat;Toilet riser   Additional Comments: hired caregivers  MWF for a few hours; can come for longer periods of time if needed      Prior Functioning/Environment Level of Independence: Independent with assistive device(s)        Comments: cargiver prepares meals, supervises shower.   [facility], OTR/L  [id number] 2014/10/13 OT Diagnosis: Generalized weakness   OT  Problem List: Decreased strength;Decreased activity tolerance   OT Treatment/Interventions: Self-care/ADL training;Therapeutic exercise;Energy conservation;Therapeutic activities;Patient/family education;Balance training    OT Goals(Current goals can be found in the care plan section) Acute Rehab OT Goals Patient Stated Goal: home OT Goal Formulation: With patient Time For Goal Achievement: 10/17/14 Potential to Achieve Goals: Good  OT Frequency: Min 2X/week   Barriers to D/C:            Co-evaluation              End of Session Equipment Utilized During Treatment: Rolling walker Nurse Communication: Mobility status  Activity Tolerance: Patient tolerated treatment well Patient left: in bed;with call bell/phone within reach;with bed alarm set   Time: [id number] OT Time Calculation (min): 17 min Charges:  OT General Charges $OT Visit: 1 Procedure OT Evaluation $Initial OT Evaluation Tier I: 1 Procedure OT Treatments $Self Care/Home Management : 8-22 mins G-Codes:    Julie Nay,HILLARY 2014-10-13, 6:08 PM

## 2014-10-03 NOTE — Progress Notes (Signed)
[facility] PROGRESS NOTE  [name] [id number] DOB: Jun 08, 1919 DOA: 10/01/2014 PCP: Benito Mccreedy, MD  Brief Summary  [name] is a 80 y.o. female with medical history significant for bilateral lower limb DVT on chronic anticoagulation with Xarelto, hypertension, diabetes, overactive bladder, GERD, duodenitis, diverticulosis who presented to the ED with periumbilical and epigastric abdominal pain, nausea, vomiting, and diarrhea with dark colored stool.  She lives at home alone. She has a neighbor who does her grocery shopping, and she does not cook because she is not able to stand for long time due to bilateral lower limb DVT. She ambulates with a walker. She had positive stool occult blood in the ED.  CT scan demonstrated mildly dilated proximal small bowel loops wtihout an obvious transition point which could reflex early small bowel obstruction vs. Proximal ileus or enteritis.  There was severe sigmoid colon diverticulosis with possible diverticulitis.  She was started on bowel rest with IVF, antibiotics, and antiemetics.  F/u KUB on 10/27 demonstrated moderate stool in the colon and bowel rest.  Advancing diet and starting bowel regimen.    Assessment/Plan  Abdominal pain due to possible early partial small bowel obstruction, enteritis, ileus, or diverticulitis.  Resolved and no longer having nausea this morning.  XR demonstrates stool and she has been constipated recently. -  Advance to CLD and then as tolerated -  Continue IVF until tolerating > 50% meals  -  Continue antiemetics -  Oral pain medication with IV for breakthrough -  Start colace, miralax, and give glycerin suppository to start -  Continue ciprofloxacin/flagyl day 3  UTI -  Continue abx as above -  F/u urine culture speciation and sensitivities  GERD, stable, continue protonix IV  Overactive bladder, hold myrbetriq  DVT, continue lovenox until clearly tolerating diet  DM type 2 with  somewhat low CBG despite dextrose fluids -  Hold metformin -  Change SSI to meal coverage -  Continue dextrose in IVF until tolerating diet  HTN, blood pressure low normal.  Continue to hold ARB  Insomnia, stable, resume remeron   RLS, stable, resume requip  Severe malnutrition  -  Appreciate nutrition recommendations -  Start glucerna QID  Diet:  CLD  Access:  PIV IVF:  yes Proph:  lovenox  Code Status: full Family Communication: patient alone Disposition Plan: pending tolerating diet   Consultants:  None  Procedures:  CT abd/pelvis  CXR  Antibiotics:  cipro 10/26 >>    Flagyl 10/26 >>  HPI/Subjective:  Abdominal pain improved, no further nausea or vomiting.  Craving pizza.  No BMs yet.      Objective: Filed Vitals:   10/02/14 1400 10/02/14 1700 10/02/14 2125 10/03/14 0447  BP: 109/51  117/45 125/48  Pulse: 51  57 55  Temp: 97.5 F (36.4 C)  98 F (36.7 C) 97.8 F (36.6 C)  TempSrc: Oral  Oral Axillary  Resp: 18  14 14   Height:  5' 4"  (1.626 m)    Weight:  47.809 kg (105 lb 6.4 oz)    SpO2: 100%  98% 95%    Intake/Output Summary (Last 24 hours) at 10/03/14 9528 Last data filed at 10/03/14 0603  Gross per 24 hour  Intake   1775 ml  Output      0 ml  Net   1775 ml   Filed Weights   10/02/14 1700  Weight: 47.809 kg (105 lb 6.4 oz)    Exam:   General:  Frail white female,  No acute distress  HEENT:  NCAT, MMM  Cardiovascular:  RRR, nl S1, S2 no mrg, 2+ pulses, warm extremities  Respiratory:  CTAB, no increased WOB  Abdomen:   NABS, soft, ND, NT  MSK:   Normal tone and bulk, no LEE  Neuro:  Grossly intact  Data Reviewed: Basic Metabolic Panel:  Recent Labs Lab 10/01/14 1744 10/02/14 0509 10/03/14 0440  NA 139 139 138  K 4.0 4.2 3.9  CL 101 106 105  CO2 25 22 22   GLUCOSE 145* 103* 103*  BUN 26* 22 19  CREATININE 0.81 0.75 0.95  CALCIUM 9.2 8.3* 8.5   Liver Function Tests:  Recent Labs Lab 10/01/14 1744  10/02/14 0509  AST 28 24  ALT 18 13  ALKPHOS 92 78  BILITOT 0.3 0.3  PROT 6.7 5.6*  ALBUMIN 3.5 2.9*    Recent Labs Lab 10/01/14 1744  LIPASE 22   No results found for this basename: AMMONIA,  in the last 168 hours CBC:  Recent Labs Lab 10/01/14 1744 10/02/14 0509 10/03/14 0440  WBC 5.7 6.8 5.1  NEUTROABS 4.6  --   --   HGB 11.0* 9.4* 10.2*  HCT 33.9* 29.0* 31.2*  MCV 91.9 90.9 91.8  PLT 217 210 175   Cardiac Enzymes: No results found for this basename: CKTOTAL, CKMB, CKMBINDEX, TROPONINI,  in the last 168 hours BNP (last 3 results) No results found for this basename: PROBNP,  in the last 8760 hours CBG:  Recent Labs Lab 10/02/14 1407 10/02/14 1649 10/02/14 2123 10/03/14 0558  GLUCAP 91 79 93 100*    Recent Results (from the past 240 hour(s))  URINE CULTURE     Status: None   Collection Time    10/01/14  6:39 PM      Result Value Ref Range Status   Specimen Description URINE, CLEAN CATCH   Final   Special Requests Normal   Final   Culture  Setup Time     Final   Value: 10/02/2014 05:57     Performed at [place]     Final   Value: >=100,000 COLONIES/ML     Performed at [facility]   Culture     Final   Value: [place]     Performed at [facility]   Report Status PENDING   Incomplete     Studies: Ct Abdomen Pelvis Wo Contrast  10/01/2014   CLINICAL DATA:  Lower abdominal pain. Nausea and vomiting. Reduced appetite. Recent diagnosis bladder infection.  EXAM: CT ABDOMEN AND PELVIS WITHOUT CONTRAST  TECHNIQUE: Multidetector CT imaging of the abdomen and pelvis was performed following the standard protocol without IV contrast.  COMPARISON:  Multiple exams, including 12/24/2013  FINDINGS: Lower chest: Mild atelectasis in the left lower lobe and right middle lobe. Dense calcification of the mitral valve. Moderate-sized hiatal hernia.  Hepatobiliary: Stable nonspecific hypodense 5 mm lesion in the dome of  the right hepatic lobe, image 9 series 2 no change from 2009 cell highly likely to be benign. Mild gallbladder wall thickening.  Pancreas: Unremarkable for age.  Spleen: Unremarkable  Adrenals/Urinary Tract: Unremarkable  Stomach/Bowel: Mildly dilated small bowel loops in the left abdomen up to 3.5 cm, with scattered air-fluid levels. Distal small bowel loops are not dilated. Well-defined transition point not seen. Prominent sigmoid colon diverticulosis. Difficult to exclude diverticulitis given the low grade diffuse mesenteric edema. Appendix not well visualized.  Vascular/Lymphatic: Aortoiliac atherosclerotic vascular disease.  Reproductive: No significant abnormality identified.  Other: No supplemental non-categorized findings.  Musculoskeletal: Levoconvex lumbar scoliosis. Pedicle screws with apparent radiolucent posterolateral rods at L4-L5-S1. Lumbar vertebral hemangiomatosis. Anterior subluxation at L3-4 and solid fused anterior subluxation at L4-5.  IMPRESSION: 1. Mildly dilated proximal loops of small bowel, technically nonspecific. I do not see an obvious lead point for obstruction. Differential diagnostic considerations include early small bowel obstruction or enteritis/proximal ileus. 2. Severe sigmoid colon diverticulosis. There is generalized ill definition of the mesentery making it difficult to confidently exclude mild diverticulitis. 3.  Aortoiliac atherosclerotic vascular disease. 4. Lumbar scoliosis. 5. Benign hypodense lesion in the dome of the right hepatic lobe, stable from 2009. 6. Dense calcification of the mitral valve. 7. Moderate-sized hiatal hernia.   Electronically Signed   By: Sherryl Barters M.D.   On: 10/01/2014 18:02   Dg Chest 1 View  10/01/2014   CLINICAL DATA:  Cough today and congestion.  EXAM: CHEST - 1 VIEW  COMPARISON:  04/05/2014, 04/03/2014.  FINDINGS: Multiple skin folds project over the chest bilaterally. Stable mild cardiomegaly with mild mitral anulus calcification  and heavy atherosclerotic calcification of the thoracic aorta. Pulmonary vascularity is normal. The lungs appear clear. No visible pneumothorax or pleural effusion. There are several healing versus remote left-sided rib fractures including the fifth, sixth, and seventh ribs. No acute bony abnormality is identified.  IMPRESSION: No acute cardiopulmonary disease.   Electronically Signed   By: Curlene Dolphin M.D.   On: 10/01/2014 17:37   Dg Abd Portable 1v  10/03/2014   CLINICAL DATA:  Abdominal pain and nausea  EXAM: PORTABLE ABDOMEN - 1 VIEW  COMPARISON:  CT abdomen and pelvis October 01, 2014  FINDINGS: There is contrast in the colon. There is moderate stool in the colon. The overall bowel gas pattern is unremarkable. No obstruction or free air is seen on this supine examination. There is postoperative change in the lumbar spine. Pelvic calcifications appear consistent with phleboliths. There is calcification in the mitral annulus.  IMPRESSION: Bowel gas pattern unremarkable.  Moderate stool in colon.   Electronically Signed   By: Lowella Grip M.D.   On: 10/03/2014 07:48    Scheduled Meds: . ciprofloxacin  400 mg Intravenous Q12H  . enoxaparin (LOVENOX) injection  1 mg/kg Subcutaneous Q24H  . Glycerin (Adult)  1 suppository Rectal Once  . insulin aspart  0-9 Units Subcutaneous 3 times per day  . metronidazole  500 mg Intravenous 3 times per day  . pantoprazole (PROTONIX) IV  40 mg Intravenous BID  . polyethylene glycol  17 g Oral BID   Continuous Infusions: . dextrose 5 % and 0.45 % NaCl with KCl 10 mEq/L 75 mL/hr at 10/02/14 2108    Principal Problem:   Abdominal pain, epigastric Active Problems:   RESTLESS LEG SYNDROME   Diverticulosis of large intestine   DVT of leg (deep venous thrombosis)   Hypothyroidism   Hx pulmonary embolism   Enteritis   Protein-calorie malnutrition, severe    Time spent: 30 min    Theador Jezewski, [place] [facility] Pager [phone]. If 7PM-7AM,  please contact night-coverage at [email or web address], password [facility] 10/03/2014, 9:03 AM  LOS: 2 days

## 2014-10-03 NOTE — Evaluation (Signed)
Physical Therapy Evaluation Patient Details Name: [name] MRN: [id number] DOB: June 08, 1919 Today's Date: 10/03/2014   History of Present Illness  80 yo female admitted with SBO, abd pain. Hx of bil LE, DVT, HTN, DM, polymyalgia rheumatica,RLS, PE. Pt is home alone with a caregiver 3x/week  Clinical Impression  On eval, pt required Min assist for mobility-able to ambulate ~200 feet with rolling walker. Discussed d/c plan-pt states she will return home with aide assisting 3x/week.    Follow Up Recommendations Home health PT;Supervision - Intermittent    Equipment Recommendations  None recommended by PT    Recommendations for Other Services       Precautions / Restrictions Precautions Precautions: Fall Restrictions Weight Bearing Restrictions: No      Mobility  Bed Mobility               General bed mobility comments: pt OOB in recliner  Transfers Overall transfer level: Needs assistance Equipment used: Rolling walker (2 wheeled) Transfers: Sit to/from [facility] Sit to Stand: Min assist Stand pivot transfers: Min assist       General transfer comment: assist to rise, stabilize, control descent, maneuver with RW. VCs safety, technique, hand placement  Ambulation/Gait Ambulation/Gait assistance: Min assist Ambulation Distance (Feet): 200 Feet Assistive device: Rolling walker (2 wheeled) Gait Pattern/deviations: Step-through pattern;Trunk flexed     General Gait Details: assist to stabilize intermittently. tolerated fairly well.   Stairs            Wheelchair Mobility    Modified Rankin (Stroke Patients Only)       Balance                                             Pertinent Vitals/Pain Pain Assessment: No/denies pain    Home Living Family/patient expects to be discharged to:: Private residence Living Arrangements: Alone   Type of Home:  (condo) Home Access: Stairs to enter Entrance  Stairs-Rails: Right;Left;Can reach both Entrance Stairs-Number of Steps: 5 Home Layout: One level Home Equipment: [place] - 2 wheels;Walker - 4 wheels;Grab bars - tub/shower Additional Comments: hired caregivers  MWF for a few hours    Prior Function Level of Independence: Independent with assistive device(s)         Comments: cargiver prepares meals, supervises shower.     Hand Dominance   Dominant Hand: Right    Extremity/Trunk Assessment   Upper Extremity Assessment: Generalized weakness           Lower Extremity Assessment: Generalized weakness      Cervical / Trunk Assessment: Kyphotic  Communication   Communication: HOH  Cognition Arousal/Alertness: Awake/alert Behavior During Therapy: WFL for tasks assessed/performed Overall Cognitive Status: Within Functional Limits for tasks assessed                      General Comments      Exercises        Assessment/Plan    PT Assessment Patient needs continued PT services  PT Diagnosis Difficulty walking;Generalized weakness   PT Problem List Decreased strength;Decreased activity tolerance;Decreased balance;Decreased mobility;Decreased knowledge of use of DME  PT Treatment Interventions DME instruction;Gait training;Functional mobility training;Therapeutic activities;Therapeutic exercise;Patient/family education;Balance training   PT Goals (Current goals can be found in the Care Plan section) Acute Rehab PT Goals Patient Stated Goal: home PT Goal Formulation: With  patient Time For Goal Achievement: 11/17/14 Potential to Achieve Goals: Good    Frequency Min 3X/week   Barriers to discharge        Co-evaluation               End of Session Equipment Utilized During Treatment: Gait belt Activity Tolerance: Patient tolerated treatment well Patient left: in chair;with call bell/phone within reach           Time: 1427-1449 PT Time Calculation (min): 22 min   Charges:    PT Evaluation $Initial PT Evaluation Tier I: 1 Procedure PT Treatments $Gait Training: 8-22 mins   PT G Codes:          Weston Anna, MPT Pager: [phone]

## 2014-10-03 NOTE — Care Management Note (Addendum)
    Page 1 of 2   10/09/2014     1:55:58 PM CARE MANAGEMENT NOTE 10/09/2014  Patient:  [name], [name]   Account Number:  [id number]  Date Initiated:  10/02/2014  Documentation initiated by:  Sunday Spillers  Subjective/Objective Assessment:   80 yo female admitted with bowel obstruction and possible diverticulitis. PTA lived at home alone, Has aide that assists.     Action/Plan:   Home when stable   Anticipated DC Date:  10/05/2014   Anticipated DC Plan:  [place]  CM consult      [facility] Choice  HOME HEALTH   Choice offered to / List presented to:  [name]        [place] arranged  HH-1 RN  [place].   Status of service:  Completed, signed off Medicare Important Message given?  YES (If response is "NO", the following Medicare IM given date fields will be blank) Date Medicare IM given:  10/09/2014 Medicare IM given by:  [facility] Date Additional Medicare IM given:   Additional Medicare IM given by:    Discharge Disposition:  [place]  Per UR Regulation:  Reviewed for med. necessity/level of care/duration of stay  If discussed at [place] of Stay Meetings, dates discussed:   10/05/2014    Comments:  10-09-14 [place] with patient at bedside. Discussed orders and recommendations for [facility], patient is agreeable. [facility] first but upon calling agency they are unable to staff. Second choice was [facility], they will provide [facility] services and are aware of d/c today. No DME needs.  10-02-14 Sunday Spillers RN CM 1300 Spoke with patient at bedside. Confirms that she lives at home alone. She has a friend/aide that comes in 3 days per week to assist with meals, housekeeping, and transportation. She also states her son is available when needed. She states her aide can add hours as needed. She is very comfortable with her current situation and  does not feel she will need any more assistance at d/c. Will continue to follow for d/c needs.

## 2014-10-03 NOTE — Progress Notes (Signed)
ANTICOAGULATION CONSULT NOTE - Initial Consult  Pharmacy Consult for Lovenox Indication: h/o pulmonary embolus and DVT  Allergies  Allergen Reactions  . Sulfa Antibiotics Nausea And Vomiting  . Penicillins Hives and Rash    Patient Measurements: Height: 5\' 4"  (162.6 cm) Weight: 105 lb 6.4 oz (47.809 kg) IBW/kg (Calculated) : 54.7kg   Vital Signs: Temp: 97.8 F (36.6 C) (10/27 0447) Temp Source: Axillary (10/27 0447) BP: 125/48 mmHg (10/27 0447) Pulse Rate: 55 (10/27 0447)  Labs:  Recent Labs  10/01/14 1744 10/02/14 0509 10/03/14 0440  HGB 11.0* 9.4* 10.2*  HCT 33.9* 29.0* 31.2*  PLT 217 210 175  APTT  --  29  --   LABPROT  --  14.7  --   INR  --  1.13  --   CREATININE 0.81 0.75 0.95    Estimated Creatinine Clearance: 27.9 ml/min (by C-G formula based on Cr of 0.95).   Medical History: Past Medical History  Diagnosis Date  . Cyst and pseudocyst of pancreas   . Unspecified essential hypertension   . Other and unspecified hyperlipidemia   . Polymyalgia rheumatica   . Personal history of unspecified digestive disease   . Restless legs syndrome (RLS)   . Insomnia, unspecified   . Other malaise and fatigue   . Diverticulosis of colon (without mention of hemorrhage)   . Acute gastritis without mention of hemorrhage   . Duodenitis without mention of hemorrhage   . Diaphragmatic hernia without mention of obstruction or gangrene   . Esophageal reflux   . Stricture and stenosis of esophagus   . OAB (overactive bladder)   . Left leg DVT jan 2013  . Unspecified sleep apnea     hx of sleep apnea, none since weight loss  . Diabetes mellitus   . Anemia   . Thyroid disease     hypothyroidism  . CAP (community acquired pneumonia) 04/03/2014  . Arthritis   . Hx pulmonary embolism     Assessment: 29 yoF with PMH significant for bilateral lower limb DVT on chronic anticoagulation with Xarelto.  Pharmacy consulted to dose lovenox for VTE treatment.  Renal: SCr  0.95 CrCl 27 ml/min  Occult blood : positive 10/01/14 PTA Xarelto 10mg  daily (on hold) LD 10/24  Please note: dosing of Xarelto for VTE treatment should be 15mg  BID x 21 days, then 20mg  daily.  Goal of Therapy:  Anticoagulation for patient with h/o DVT/PE Monitor platelets by anticoagulation protocol: Yes   Plan:  Due to CrCl < 81ml/min and current weight of 47.8kg, adjust Lovenox to 50mg  subq Q24H (1mg /kg Q24H) Follow CBC, SCr, platelets  Follow plans to return to oral anticoagulant. Per product labeling, it is recommended to avoid Xarelto use in pts with CrCl < 41min/min.   Kizzie Furnish, PharmD Pager: [phone] 10/03/2014 7:32 AM

## 2014-10-04 ENCOUNTER — Inpatient Hospital Stay (HOSPITAL_COMMUNITY): Payer: Medicare Other

## 2014-10-04 DIAGNOSIS — E860 Dehydration: Secondary | ICD-10-CM

## 2014-10-04 DIAGNOSIS — R112 Nausea with vomiting, unspecified: Secondary | ICD-10-CM

## 2014-10-04 DIAGNOSIS — K5080 Crohn's disease of both small and large intestine without complications: Secondary | ICD-10-CM | POA: Diagnosis present

## 2014-10-04 DIAGNOSIS — R195 Other fecal abnormalities: Secondary | ICD-10-CM | POA: Diagnosis present

## 2014-10-04 DIAGNOSIS — K50819 Crohn's disease of both small and large intestine with unspecified complications: Secondary | ICD-10-CM

## 2014-10-04 LAB — GLUCOSE, CAPILLARY
GLUCOSE-CAPILLARY: 110 mg/dL — AB (ref 70–99)
GLUCOSE-CAPILLARY: 124 mg/dL — AB (ref 70–99)
Glucose-Capillary: 117 mg/dL — ABNORMAL HIGH (ref 70–99)
Glucose-Capillary: 141 mg/dL — ABNORMAL HIGH (ref 70–99)

## 2014-10-04 LAB — BASIC METABOLIC PANEL
ANION GAP: 12 (ref 5–15)
BUN: 16 mg/dL (ref 6–23)
CO2: 23 mEq/L (ref 19–32)
Calcium: 8.1 mg/dL — ABNORMAL LOW (ref 8.4–10.5)
Chloride: 107 mEq/L (ref 96–112)
Creatinine, Ser: 0.96 mg/dL (ref 0.50–1.10)
GFR calc non Af Amer: 49 mL/min — ABNORMAL LOW (ref >90)
GFR, EST AFRICAN AMERICAN: 57 mL/min — AB (ref >90)
Glucose, Bld: 128 mg/dL — ABNORMAL HIGH (ref 70–99)
POTASSIUM: 3.9 meq/L (ref 3.7–5.3)
Sodium: 142 mEq/L (ref 137–147)

## 2014-10-04 LAB — URINE CULTURE
Colony Count: >=100000
SPECIAL REQUESTS: NORMAL

## 2014-10-04 LAB — CBC
HEMATOCRIT: 30 % — AB (ref 36.0–46.0)
Hemoglobin: 9.9 g/dL — ABNORMAL LOW (ref 12.0–15.0)
MCH: 29.6 pg (ref 26.0–34.0)
MCHC: 33 g/dL (ref 30.0–36.0)
MCV: 89.8 fL (ref 78.0–100.0)
Platelets: 179 10*3/uL (ref 150–400)
RBC: 3.34 MIL/uL — ABNORMAL LOW (ref 3.87–5.11)
RDW: 14 % (ref 11.5–15.5)
WBC: 4.8 10*3/uL (ref 4.0–10.5)

## 2014-10-04 LAB — RETICULOCYTES
RBC.: 4.06 MIL/uL (ref 3.87–5.11)
RETIC COUNT ABSOLUTE: 48.7 10*3/uL (ref 19.0–186.0)
RETIC CT PCT: 1.2 % (ref 0.4–3.1)

## 2014-10-04 LAB — IRON AND TIBC
Iron: 21 ug/dL — ABNORMAL LOW (ref 42–135)
Saturation Ratios: 8 % — ABNORMAL LOW (ref 20–55)
TIBC: 269 ug/dL (ref 250–470)
UIBC: 248 ug/dL (ref 125–400)

## 2014-10-04 LAB — SEDIMENTATION RATE: SED RATE: 30 mm/h — AB (ref 0–22)

## 2014-10-04 LAB — C-REACTIVE PROTEIN: CRP: 1.3 mg/dL — AB (ref <0.60)

## 2014-10-04 LAB — LIPASE, BLOOD: Lipase: 17 U/L (ref 11–59)

## 2014-10-04 MED ORDER — PANTOPRAZOLE SODIUM 40 MG IV SOLR
40.0000 mg | Freq: Two times a day (BID) | INTRAVENOUS | Status: DC
  Administered 2014-10-04 – 2014-10-09 (×11): 40 mg via INTRAVENOUS
  Filled 2014-10-04 (×12): qty 40

## 2014-10-04 MED ORDER — ONDANSETRON HCL 4 MG/2ML IJ SOLN
4.0000 mg | Freq: Three times a day (TID) | INTRAMUSCULAR | Status: DC
  Administered 2014-10-04 – 2014-10-08 (×4): 4 mg via INTRAVENOUS
  Filled 2014-10-04 (×6): qty 2

## 2014-10-04 MED ORDER — ONDANSETRON HCL 4 MG PO TABS
4.0000 mg | ORAL_TABLET | Freq: Three times a day (TID) | ORAL | Status: DC
  Administered 2014-10-05 – 2014-10-09 (×9): 4 mg via ORAL
  Filled 2014-10-04 (×17): qty 1

## 2014-10-04 NOTE — Progress Notes (Signed)
ANTICOAGULATION CONSULT NOTE -   Pharmacy Consult for Lovenox Indication: h/o pulmonary embolus and DVT  Allergies  Allergen Reactions  . Sulfa Antibiotics Nausea And Vomiting  . Penicillins Hives and Rash   Patient Measurements: Height: 5\' 4"  (162.6 cm) Weight: 105 lb 6.4 oz (47.809 kg) IBW/kg (Calculated) : 54.7kg  Vital Signs: Temp: 97.9 F (36.6 C) (10/28 0300) Temp Source: Oral (10/28 0300) BP: 136/52 mmHg (10/28 0300) Pulse Rate: 68 (10/28 0300)  Labs:  Recent Labs  10/02/14 0509 10/03/14 0440 10/04/14 0403  HGB 9.4* 10.2* 9.9*  HCT 29.0* 31.2* 30.0*  PLT 210 175 179  APTT 29  --   --   LABPROT 14.7  --   --   INR 1.13  --   --   CREATININE 0.75 0.95 0.96   Estimated Creatinine Clearance: 27.6 ml/min (by C-G formula based on Cr of 0.96).  Medical History: Past Medical History  Diagnosis Date  . Cyst and pseudocyst of pancreas   . Unspecified essential hypertension   . Other and unspecified hyperlipidemia   . Polymyalgia rheumatica   . Personal history of unspecified digestive disease   . Restless legs syndrome (RLS)   . Insomnia, unspecified   . Other malaise and fatigue   . Diverticulosis of colon (without mention of hemorrhage)   . Acute gastritis without mention of hemorrhage   . Duodenitis without mention of hemorrhage   . Diaphragmatic hernia without mention of obstruction or gangrene   . Esophageal reflux   . Stricture and stenosis of esophagus   . OAB (overactive bladder)   . Left leg DVT jan 2013  . Unspecified sleep apnea     hx of sleep apnea, none since weight loss  . Diabetes mellitus   . Anemia   . Thyroid disease     hypothyroidism  . CAP (community acquired pneumonia) 04/03/2014  . Arthritis   . Hx pulmonary embolism    Assessment: [name] with PMH significant for bilateral lower limb DVT on chronic anticoagulation with Xarelto 10mg  daily PTA. Admit with abdominal pain, N/V/D, NPO on admit. Pharmacy consulted to dose lovenox for  VTE treatment.  Renal: SCr 0.96 CrCl 27 ml/min  Occult blood : positive 10/01/14 PTA Xarelto 10mg  daily (on hold) LD 10/24  Please note: dosing of Xarelto for VTE treatment should be 15mg  BID x 21 days, then 20mg  daily.  Goal of Therapy:  Anticoagulation for patient with h/o DVT/PE Monitor platelets by anticoagulation protocol: Yes   Plan:  Due to CrCl < 45ml/min and current weight of 47.8kg, Lovenox to 50mg  subq Q24H (1mg /kg Q24H) Follow CBC, SCr, platelets  Follow plans to return to oral anticoagulant, for Full Liquid diet today Per product labeling, it is recommended to avoid Xarelto use in pts with CrCl < 60min/min. Home Xarelto dose below recommended 20mg  daily.  Minda Ditto PharmD Pager [phone] 10/04/2014, 8:46 AM

## 2014-10-04 NOTE — Progress Notes (Signed)
[facility] PROGRESS NOTE  [name]:[id number] DOB: Dec 04, 1919 DOA: 10/01/2014 PCP: Benito Mccreedy, MD  Brief Summary  [name] is a 80 y.o. female with medical history significant for bilateral lower limb DVT on chronic anticoagulation with Xarelto, hypertension, diabetes, overactive bladder, GERD, duodenitis, diverticulosis who presented to the ED with periumbilical and epigastric abdominal pain, nausea, vomiting, and diarrhea with dark colored stool.  She lives at home alone. She has a neighbor who does her grocery shopping, and she does not cook because she is not able to stand for long time due to bilateral lower limb DVT. She ambulates with a walker. She had positive stool occult blood in the ED.  CT scan demonstrated mildly dilated proximal small bowel loops wtihout an obvious transition point which could reflex early small bowel obstruction vs. Proximal ileus or enteritis.  There was severe sigmoid colon diverticulosis with possible diverticulitis.  She was started on bowel rest with IVF, antibiotics, and antiemetics.  F/u KUB on 10/27 demonstrated moderate stool in the colon and bowel rest.  Advancing diet and starting bowel regimen.    Assessment/Plan Abdominal pain:  due to possible early partial small bowel obstruction, enteritis, ileus, or diverticulitis.  Patient with worsening abdominal pain today and nausea.  -Will repeat KUB. Check lipase.  -Change protonix to IV.  - Continue IVF  - Continue antiemetics - Oral pain medication with IV for breakthrough - Continue ciprofloxacin/flagyl day 4. -GI consulted due to recurrence pain, Does she needs endoscopy.  -Patient had BM 10-27.   UTI -  Continue abx as above - Klebsiella sensitive to cipro,.   GERD, stable, continue protonix IV  Overactive bladder, hold myrbetriq  DVT, continue lovenox until clearly tolerating diet  DM type 2 with somewhat low CBG despite dextrose fluids -  Hold metformin -   Change SSI to meal coverage -  Continue dextrose in IVF until tolerating diet  HTN, blood pressure low normal.  Continue to hold ARB  Insomnia, stable, resume remeron   RLS, stable, resume requip  Severe malnutrition  -  Appreciate nutrition recommendations -  Start glucerna QID  Diet:  CLD  Access:  PIV IVF:  yes Proph:  lovenox  Code Status: full Family Communication: patient alone Disposition Plan: pending tolerating diet   Consultants:  None  Procedures:  CT abd/pelvis  CXR  Antibiotics:  cipro 10/26 >>    Flagyl 10/26 >>  HPI/Subjective: Had BM yesterday. Starting having epigastric pain again today, nausea.   Objective: Filed Vitals:   10/03/14 0447 10/03/14 1400 10/03/14 2130 10/04/14 0300  BP: 125/48 124/69 130/50 136/52  Pulse: 55 66 64 68  Temp: 97.8 F (36.6 C) 98.1 F (36.7 C) 99 F (37.2 C) 97.9 F (36.6 C)  TempSrc: Axillary Axillary Oral Oral  Resp: 14 14 16 16   Height:      Weight:      SpO2: 95% 100% 98% 95%    Intake/Output Summary (Last 24 hours) at 10/04/14 1123 Last data filed at 10/04/14 0900  Gross per 24 hour  Intake    600 ml  Output      0 ml  Net    600 ml   Filed Weights   10/02/14 1700  Weight: 47.809 kg (105 lb 6.4 oz)    Exam:   General:  Frail white female, No acute distress  HEENT:  NCAT, MMM  Cardiovascular:  RRR, nl S1, S2 no mrg.   Respiratory:  CTAB, no increased WOB  Abdomen:   NABS, soft, ND, NT  MSK:   Normal tone and bulk, no LEE  Neuro:  Grossly intact  Data Reviewed: Basic Metabolic Panel:  Recent Labs Lab 10/01/14 1744 10/02/14 0509 10/03/14 0440 10/04/14 0403  NA 139 139 138 142  K 4.0 4.2 3.9 3.9  CL 101 106 105 107  CO2 25 22 22 23   GLUCOSE 145* 103* 103* 128*  BUN 26* 22 19 16   CREATININE 0.81 0.75 0.95 0.96  CALCIUM 9.2 8.3* 8.5 8.1*   Liver Function Tests:  Recent Labs Lab 10/01/14 1744 10/02/14 0509  AST 28 24  ALT 18 13  ALKPHOS 92 78  BILITOT 0.3 0.3   PROT 6.7 5.6*  ALBUMIN 3.5 2.9*    Recent Labs Lab 10/01/14 1744  LIPASE 22   No results found for this basename: AMMONIA,  in the last 168 hours CBC:  Recent Labs Lab 10/01/14 1744 10/02/14 0509 10/03/14 0440 10/04/14 0403  WBC 5.7 6.8 5.1 4.8  NEUTROABS 4.6  --   --   --   HGB 11.0* 9.4* 10.2* 9.9*  HCT 33.9* 29.0* 31.2* 30.0*  MCV 91.9 90.9 91.8 89.8  PLT 217 210 175 179   Cardiac Enzymes: No results found for this basename: CKTOTAL, CKMB, CKMBINDEX, TROPONINI,  in the last 168 hours BNP (last 3 results) No results found for this basename: PROBNP,  in the last 8760 hours CBG:  Recent Labs Lab 10/02/14 2123 10/03/14 0558 10/03/14 1222 10/03/14 2159 10/04/14 0729  GLUCAP 93 100* 136* 108* 110*    Recent Results (from the past 240 hour(s))  URINE CULTURE     Status: None   Collection Time    10/01/14  6:39 PM      Result Value Ref Range Status   Specimen Description URINE, CLEAN CATCH   Final   Special Requests Normal   Final   Culture  Setup Time     Final   Value: 10/02/2014 05:57     Performed at [place]     Final   Value: >=100,000 COLONIES/ML     Performed at [facility]   Culture     Final   Value: KLEBSIELLA PNEUMONIAE     Performed at [facility]   Report Status 10/04/2014 FINAL   Final   Organism ID, Bacteria KLEBSIELLA PNEUMONIAE   Final     Studies: Dg Abd Portable 1v  10/03/2014   CLINICAL DATA:  Abdominal pain and nausea  EXAM: PORTABLE ABDOMEN - 1 VIEW  COMPARISON:  CT abdomen and pelvis October 01, 2014  FINDINGS: There is contrast in the colon. There is moderate stool in the colon. The overall bowel gas pattern is unremarkable. No obstruction or free air is seen on this supine examination. There is postoperative change in the lumbar spine. Pelvic calcifications appear consistent with phleboliths. There is calcification in the mitral annulus.  IMPRESSION: Bowel gas pattern unremarkable.   Moderate stool in colon.   Electronically Signed   By: Lowella Grip M.D.   On: 10/03/2014 07:48    Scheduled Meds: . ciprofloxacin  400 mg Intravenous Q12H  . docusate sodium  100 mg Oral BID  . enoxaparin (LOVENOX) injection  1 mg/kg Subcutaneous Q24H  . feeding supplement (GLUCERNA SHAKE)  237 mL Oral TID PC & HS  . insulin aspart  0-9 Units Subcutaneous TID WC  . levothyroxine  25 mcg Oral QAC breakfast  . metronidazole  500 mg Intravenous 3 times per day  . mirtazapine  7.5 mg Oral QHS  . pantoprazole (PROTONIX) IV  40 mg Intravenous Q12H  . polyethylene glycol  17 g Oral BID  . rOPINIRole  5 mg Oral QHS   Continuous Infusions: . dextrose 5 % and 0.45 % NaCl with KCl 10 mEq/L 50 mL/hr at 10/03/14 4360    Principal Problem:   Abdominal pain, epigastric Active Problems:   RESTLESS LEG SYNDROME   Diverticulosis of large intestine   DVT of leg (deep venous thrombosis)   Hypothyroidism   Hx pulmonary embolism   Enteritis   Protein-calorie malnutrition, severe    Time spent: 30 min    Keirra Zeimet, [place] [facility] Pager [phone]. If 7PM-7AM, please contact night-coverage at [email or web address], password [facility] 10/04/2014, 11:23 AM  LOS: 3 days

## 2014-10-04 NOTE — Progress Notes (Signed)
OT Cancellation Note  Patient Details Name: ANIKA HANIGAN MRN: [id number] DOB: 25-Nov-1919   Cancelled Treatment:    Reason Eval/Treat Not Completed: Pain limiting ability to participate.  RN has recently given her pain medication. Will check later, if schedule permits.  Marquez Ceesay 10/04/2014, 11:04 AM Lesle Chris, OTR/L [phone] 10/04/2014

## 2014-10-04 NOTE — Consult Note (Signed)
Referring Provider: [facility] -Dr. Tyrell Antonio Primary Care Physician:  Benito Mccreedy, MD Primary Gastroenterologist:  Dr.Perry  Reason for Consultation:  Abdominal pain, nausea,diarrhea  HPI: [name] is a 80 y.o. female admitted to the hospital on 10/01/2014 with complaints of midabdominal pain 48 hours associated with diarrhea and nausea. She was documented heme positive in the emergency room. Her symptoms have failed to improve over the past couple of days and GI is consulted to help with management. Patient is known to Dr. Scarlette Shorts. She had EGD in August 2014 after a CT scan of the abdomen and pelvis had shown gastric wall thickening and irregularity. EGD was normal she did have an esophageal stricture which was dilated. Patient has remote history of Crohn's ileocolitis which was documented last on colonoscopy in 2001. She had colonoscopy again in 2004 which was incomplete to the hepatic flexure and there was no evidence of Crohn's disease at that time. She does have extensive sigmoid diverticulosis. She also gone undergone EUS in 2007 for a small cystic lesion in the pancreatic neck this was aspirated and fluid was benign. Patient has history of recurrent DVTs and pulmonary emboli and had been on Xarelto at home. She also has history of diabetes mellitus hypertension and GERD. Patient states that her symptoms started last Friday with a bad episode of diarrhea. She said the following day she felt better and then that evening had a very large amount of foul smelling diarrheal stool. She awakened Sunday morning to find that she had been incontinent of the bed again with a large amount of liquid stool. She developed some abdominal pain and cramping Sunday morning and then came onto the emergency room. She says her pain has progressed since that time and is constant and it in her mid abdomen. She's not had any diarrhea today she continues to feel queasy but has not been vomiting, she has no  appetite. No fever or chills. CT of the abdomen and pelvis done without IV contrast on 10/01/2014 shows some mildly dilated small bowel loops in the left abdomen up to 3.5 cm scattered air fluid levels but no definite transition point also noted to have prominent diverticulosis and mild diverticulitis could not be ruled out because she had mild diffuse mesenteric edema. KUB done earlier this morning shows no evidence of bowel obstruction normal gas pattern. Labs reveal a chronic anemia prior iron studies showing mild iron deficiency.   Past Medical History  Diagnosis Date  . Cyst and pseudocyst of pancreas   . Unspecified essential hypertension   . Other and unspecified hyperlipidemia   . Polymyalgia rheumatica   . Personal history of unspecified digestive disease   . Restless legs syndrome (RLS)   . Insomnia, unspecified   . Other malaise and fatigue   . Diverticulosis of colon (without mention of hemorrhage)   . Acute gastritis without mention of hemorrhage   . Duodenitis without mention of hemorrhage   . Diaphragmatic hernia without mention of obstruction or gangrene   . Esophageal reflux   . Stricture and stenosis of esophagus   . OAB (overactive bladder)   . Left leg DVT jan 2013  . Unspecified sleep apnea     hx of sleep apnea, none since weight loss  . Diabetes mellitus   . Anemia   . Thyroid disease     hypothyroidism  . CAP (community acquired pneumonia) 04/03/2014  . Arthritis   . Hx pulmonary embolism     Past Surgical History  Procedure Laterality Date  . Oophorectomy  1944    not sure which ovary  . Rotator cuff repair  right     4-5 yrs ago  . Eye surgery      cataracts removed-bilateral eyes  . Back surgery  2009    lower back  . Colon surgery  June 17, 2012    surgery for bleed  . Shoulder open rotator cuff repair  08/04/2012    Procedure: ROTATOR CUFF REPAIR SHOULDER OPEN;  Surgeon: Tobi Bastos, MD;  Location: [facility];  Service: Orthopedics;   Laterality: Left;  with Anchors and Graft  . Reduction mammaplasty Bilateral 1980    Prior to Admission medications   Medication Sig Start Date End Date Taking? Authorizing Provider  acetaminophen (TYLENOL) 325 MG tablet Take 2 tablets (650 mg total) by mouth every 4 (four) hours as needed for mild pain, fever or headache (or Fever >/= 101). 04/06/14  Yes Robbie Lis, MD  cyanocobalamin (,VITAMIN B-12,) 1000 MCG/ML injection Inject 1,000 mcg into the muscle every 30 (thirty) days.   Yes Historical Provider, MD  diclofenac sodium (VOLTAREN) 1 % GEL Apply 2 g topically every morning.   Yes Historical Provider, MD  feeding supplement, ENSURE COMPLETE, (ENSURE COMPLETE) LIQD Take 237 mLs by mouth 3 (three) times daily with meals. 04/06/14  Yes Robbie Lis, MD  levothyroxine (SYNTHROID, LEVOTHROID) 25 MCG tablet Take 1 tablet (25 mcg total) by mouth daily before breakfast. 04/06/14  Yes Robbie Lis, MD  losartan (COZAAR) 50 MG tablet Take 50 mg by mouth daily.   Yes Historical Provider, MD  metFORMIN (GLUCOPHAGE) 500 MG tablet Take 1 tablet (500 mg total) by mouth daily with breakfast. 04/06/14  Yes Robbie Lis, MD  mirtazapine (REMERON) 7.5 MG tablet Take 7.5 mg by mouth at bedtime.   Yes Historical Provider, MD  ondansetron (ZOFRAN) 4 MG tablet Take 1 tablet (4 mg total) by mouth every 6 (six) hours as needed for nausea. 04/06/14  Yes Robbie Lis, MD  oxyCODONE (OXY IR/ROXICODONE) 5 MG immediate release tablet Take 2 tablets (10 mg total) by mouth every 6 (six) hours as needed for moderate pain. 04/06/14  Yes Robbie Lis, MD  PRESCRIPTION MEDICATION Antibiotic shot - name unknown at Dr. Doristine Section -Bonsu   Yes Historical Provider, MD  rivaroxaban (XARELTO) 10 MG TABS tablet Take 10 mg by mouth daily.   Yes Historical Provider, MD  ropinirole (REQUIP) 5 MG tablet Take 5 mg by mouth at bedtime.   Yes Historical Provider, MD  senna-docusate (SENOKOT-S) 8.6-50 MG per tablet Take 1-2 tablets by  mouth daily as needed for mild constipation or moderate constipation.    Yes Historical Provider, MD    Current Facility-Administered Medications  Medication Dose Route Frequency Provider Last Rate Last Dose  . acetaminophen (TYLENOL) tablet 650 mg  650 mg Oral Q6H PRN Janece Canterbury, MD      . ciprofloxacin (CIPRO) IVPB 400 mg  400 mg Intravenous Q12H Tresa Garter, MD   400 mg at 10/04/14 1000  . dextrose 5 % and 0.45 % NaCl with KCl 10 mEq/L infusion   Intravenous Continuous Janece Canterbury, MD 50 mL/hr at 10/03/14 0912    . docusate sodium (COLACE) capsule 100 mg  100 mg Oral BID Janece Canterbury, MD   100 mg at 10/03/14 2132  . enoxaparin (LOVENOX) injection 50 mg  1 mg/kg Subcutaneous Q24H Julio Sicks, RPH   50 mg at  10/03/14 1707  . feeding supplement (GLUCERNA SHAKE) (GLUCERNA SHAKE) liquid 237 mL  237 mL Oral TID PC & HS Renae Fickle, MD   237 mL at 10/03/14 1709  . insulin aspart (novoLOG) injection 0-9 Units  0-9 Units Subcutaneous TID WC Renae Fickle, MD   1 Units at 10/03/14 1200  . levothyroxine (SYNTHROID, LEVOTHROID) tablet 25 mcg  25 mcg Oral QAC breakfast Renae Fickle, MD      . metroNIDAZOLE (FLAGYL) IVPB 500 mg  500 mg Intravenous 3 times per day Quentin Angst, MD   500 mg at 10/04/14 0600  . mirtazapine (REMERON) tablet 7.5 mg  7.5 mg Oral QHS Renae Fickle, MD   7.5 mg at 10/03/14 2132  . morphine 2 MG/ML injection 2 mg  2 mg Intravenous Q3H PRN Renae Fickle, MD      . ondansetron (ZOFRAN) tablet 4 mg  4 mg Oral Q6H PRN Quentin Angst, MD       Or  . ondansetron (ZOFRAN) injection 4 mg  4 mg Intravenous Q6H PRN Quentin Angst, MD   4 mg at 10/04/14 0801  . ondansetron (ZOFRAN) tablet 4 mg  4 mg Oral Q6H PRN Quentin Angst, MD      . oxyCODONE (Oxy IR/ROXICODONE) immediate release tablet 5-10 mg  5-10 mg Oral Q4H PRN Renae Fickle, MD   10 mg at 10/04/14 0945  . pantoprazole (PROTONIX) injection 40 mg  40 mg Intravenous Q12H  Belkys A Regalado, MD   40 mg at 10/04/14 1158  . polyethylene glycol (MIRALAX / GLYCOLAX) packet 17 g  17 g Oral BID Renae Fickle, MD   17 g at 10/03/14 0945  . rOPINIRole (REQUIP) tablet 5 mg  5 mg Oral QHS Renae Fickle, MD   5 mg at 10/03/14 2132    Allergies as of 10/01/2014 - Review Complete 10/01/2014  Allergen Reaction Noted  . Sulfa antibiotics Nausea And Vomiting 03/31/2011  . Penicillins Hives and Rash 03/31/2011    Family History  Problem Relation Age of Onset  . Melanoma Daughter     died age 18  . Melanoma Son     History   Social History  . Marital Status: Widowed    Spouse Name: N/A    Number of Children: 2  . Years of Education: N/A   Occupational History  . Retired    Social History Main Topics  . Smoking status: Never Smoker   . Smokeless tobacco: Never Used  . Alcohol Use: No  . Drug Use: No  . Sexual Activity: No   Review of Systems: Pertinent positive and negative review of systems were noted in the above HPI section.  All other review of systems was otherwise negative.  Physical Exam: Vital signs in last 24 hours: Temp:  [97.9 F (36.6 C)-99 F (37.2 C)] 97.9 F (36.6 C) (10/28 0300) Pulse Rate:  [64-68] 68 (10/28 0300) Resp:  [14-16] 16 (10/28 0300) BP: (124-136)/(50-69) 136/52 mmHg (10/28 0300) SpO2:  [95 %-100 %] 95 % (10/28 0300) Last BM Date: 10/04/14 General:   Alert,  Well-developed, elderly WF ,uncomfortable appearing, in NAD Head:  Normocephalic and atraumatic. Eyes:  Sclera clear, no icterus.   Conjunctiva pink. Ears:  Normal auditory acuity. Nose:  No deformity, discharge,  or lesions. Mouth:  No deformity or lesions.   Neck:  Supple; no masses or thyromegaly. Lungs:  Clear throughout to auscultation.   No wheezes, crackles, or rhonchi. Heart:  Regular rate and rhythm;  no murmurs, clicks, rubs,  or gallops. Abdomen:  Soft,tender, across mid abdomen BS present,,nonpalp mass or hsm., no guarding or rebound   Rectal:   Deferred  Msk:  Symmetrical without gross deformities. . Pulses:  Normal pulses noted. Extremities:  Without clubbing or edema. Neurologic:  Alert and  oriented x4;  grossly normal neurologically. Skin:  Intact without significant lesions or rashes.. Psych:  Alert and cooperative. Normal mood and affect.  Lab Results:  Recent Labs  10/02/14 0509 10/03/14 0440 10/04/14 0403  WBC 6.8 5.1 4.8  HGB 9.4* 10.2* 9.9*  HCT 29.0* 31.2* 30.0*  PLT 210 175 179   BMET  Recent Labs  10/02/14 0509 10/03/14 0440 10/04/14 0403  NA 139 138 142  K 4.2 3.9 3.9  CL 106 105 107  CO2 $Re'22 22 23  'wRN$ GLUCOSE 103* 103* 128*  BUN $Re'22 19 16  'VFm$ CREATININE 0.75 0.95 0.96  CALCIUM 8.3* 8.5 8.1*   LFT  Recent Labs  10/02/14 0509  PROT 5.6*  ALBUMIN 2.9*  AST 24  ALT 13  ALKPHOS 78  BILITOT 0.3      IMPRESSION:  #64  80 year old white female with 6 day history of illness onset with diarrhea and nausea than 48 hours began with mid abdominal pain which has been constant and persistent. Patient has remote history of Crohn's ileocolitis which has been quiescent over many years. CT scan without IV contrast suggested ileus versus early partial or proximal small bowel obstruction. Etiology of her current symptoms is not clear, would rule out C. difficile, rule out reactivated Crohn's ileitis with ileus versus early partial small bowel obstruction. Diverticulitis is possible though felt less likely #2 history of polymyalgia rheumatica #3 chronic GERD with history of esophageal stricture #4 history of bilateral DVTs and pulmonary embolus on chronic Xarelto #5 Diabetes mellitus #6 hypertension #7 anemia normocytic with history of iron deficiency  PLAN: #1 Clear liquid diet #2 round the clock antiemetics #3 pain control #4 CRP, ESR, Stool pathogen panel #5 she may need contrasted CT or Ct enterography for further evaluation-will discuss, and follow with you   Amy Esterwood  10/04/2014, 12:18  PM   GI Attending  I have also seen and assessed the patient and agree with the above note. Hard to know for sure what primary process is. Leading candidate would be Crohn's disease though I am not confident enough of that alone to start steroids. Continue Abx and we will  Follow.  Gatha Mayer, MD, [facility] [phone] (pager) 10/04/2014 5:54 PM

## 2014-10-05 ENCOUNTER — Inpatient Hospital Stay (HOSPITAL_COMMUNITY): Payer: Medicare Other

## 2014-10-05 DIAGNOSIS — R05 Cough: Secondary | ICD-10-CM

## 2014-10-05 LAB — CBC
HCT: 30.2 % — ABNORMAL LOW (ref 36.0–46.0)
Hemoglobin: 9.9 g/dL — ABNORMAL LOW (ref 12.0–15.0)
MCH: 29.9 pg (ref 26.0–34.0)
MCHC: 32.8 g/dL (ref 30.0–36.0)
MCV: 91.2 fL (ref 78.0–100.0)
PLATELETS: 156 10*3/uL (ref 150–400)
RBC: 3.31 MIL/uL — ABNORMAL LOW (ref 3.87–5.11)
RDW: 14 % (ref 11.5–15.5)
WBC: 4.1 10*3/uL (ref 4.0–10.5)

## 2014-10-05 LAB — GI PATHOGEN PANEL BY PCR, STOOL
C difficile toxin A/B: NEGATIVE
Campylobacter by PCR: NEGATIVE
Cryptosporidium by PCR: NEGATIVE
E COLI 0157 BY PCR: NEGATIVE
E coli (ETEC) LT/ST: NEGATIVE
E coli (STEC): NEGATIVE
G lamblia by PCR: NEGATIVE
Norovirus GI/GII: NEGATIVE
ROTAVIRUS A BY PCR: NEGATIVE
SALMONELLA BY PCR: NEGATIVE
SHIGELLA BY PCR: NEGATIVE

## 2014-10-05 LAB — GLUCOSE, CAPILLARY
Glucose-Capillary: 120 mg/dL — ABNORMAL HIGH (ref 70–99)
Glucose-Capillary: 152 mg/dL — ABNORMAL HIGH (ref 70–99)
Glucose-Capillary: 107 mg/dL — ABNORMAL HIGH (ref 70–99)
Glucose-Capillary: 126 mg/dL — ABNORMAL HIGH (ref 70–99)

## 2014-10-05 LAB — VITAMIN B12: Vitamin B-12: 1588 pg/mL — ABNORMAL HIGH (ref 211–911)

## 2014-10-05 LAB — BASIC METABOLIC PANEL
Anion gap: 9 (ref 5–15)
BUN: 11 mg/dL (ref 6–23)
CALCIUM: 8.3 mg/dL — AB (ref 8.4–10.5)
CO2: 26 mEq/L (ref 19–32)
Chloride: 105 mEq/L (ref 96–112)
Creatinine, Ser: 1.01 mg/dL (ref 0.50–1.10)
GFR calc Af Amer: 54 mL/min — ABNORMAL LOW (ref >90)
GFR, EST NON AFRICAN AMERICAN: 46 mL/min — AB (ref >90)
Glucose, Bld: 131 mg/dL — ABNORMAL HIGH (ref 70–99)
Potassium: 3.9 mEq/L (ref 3.7–5.3)
SODIUM: 140 meq/L (ref 137–147)

## 2014-10-05 LAB — OCCULT BLOOD X 1 CARD TO LAB, STOOL: Fecal Occult Bld: NEGATIVE

## 2014-10-05 LAB — FERRITIN: FERRITIN: 58 ng/mL (ref 10–291)

## 2014-10-05 LAB — FOLATE: Folate: >20 ng/mL

## 2014-10-05 MED ORDER — GUAIFENESIN ER 600 MG PO TB12
600.0000 mg | ORAL_TABLET | Freq: Two times a day (BID) | ORAL | Status: DC
  Administered 2014-10-05 – 2014-10-09 (×9): 600 mg via ORAL
  Filled 2014-10-05 (×11): qty 1

## 2014-10-05 MED ORDER — DEXTROMETHORPHAN POLISTIREX 30 MG/5ML PO LQCR
15.0000 mg | Freq: Once | ORAL | Status: AC
  Administered 2014-10-05: 15 mg via ORAL
  Filled 2014-10-05: qty 5

## 2014-10-05 NOTE — Progress Notes (Signed)
Patient ID: [name], female   DOB: [birth year], 80 y.o.   MRN: [id number]  [facility] Progress Note  Subjective: Abdominal pain not as bad, wants to try something to eat. Diarrhea last night. Says she feels terrible up all night with a new wet cough. ESR 30  CRP 1.6 GI pathogen panel pending  Objective:  Vital signs in last 24 hours: Temp:  [98.2 F (36.8 C)-98.4 F (36.9 C)] 98.3 F (36.8 C) (10/29 0546) Pulse Rate:  [58-69] 69 (10/29 0546) Resp:  [16] 16 (10/29 0546) BP: (123-168)/(61-78) 147/61 mmHg (10/29 0546) SpO2:  [95 %-100 %] 97 % (10/29 0546) Last BM Date: 10/04/14 General:   Alert,  Well-developed, frail elderly WF   in NAD, ill appearing,coughing Heart:  Regular rate and rhythm; no murmurs Pulm;rhonchi bilaterally Abdomen:  Soft, mildly tender upper abdomen and nondistended. Normal bowel sounds, without guarding, and without rebound.   Extremities:  Without edema. Neurologic:  Alert and  oriented x4;  grossly normal neurologically. Psych:  Alert and cooperative. Normal mood and affect.  Intake/Output from previous day: 10/28 0701 - 10/29 0700 In: 960 [P.O.:360; I.V.:600] Out: 300 [Urine:300] Intake/Output this shift:    Lab Results:  Recent Labs  10/03/14 0440 10/04/14 0403 10/05/14 0514  WBC 5.1 4.8 4.1  HGB 10.2* 9.9* 9.9*  HCT 31.2* 30.0* 30.2*  PLT 175 179 156   BMET  Recent Labs  10/03/14 0440 10/04/14 0403 10/05/14 0514  NA 138 142 140  K 3.9 3.9 3.9  CL 105 107 105  CO2 $Re'22 23 26  'bBK$ GLUCOSE 103* 128* 131*  BUN $Re'19 16 11  'uGk$ CREATININE 0.95 0.96 1.01  CALCIUM 8.5 8.1* 8.3*     Assessment / Plan: #1 80 yo female with hx of Crohns  Which has been quiescent for years with acute illness with abdominal pain, nausea, diarrhea. This may be Crohns reactivation vs infectious gastroenteritis with uncontrasted CT showing ? Early partial obstruction on admit-subsequent abdominal films with normal gas pattern- on cipro/fagyl  empirically Await GI pathogen panel  Advance to full liquid diet Consider steroids if pathogen panel neg #2 New cough/congestion- will order CXR- then defer to hospitalist #3 hx PMR #4 HX DVT/PE     LOS: 4 days   Amy Esterwood  10/05/2014, 8:53 AM  [facility] Attending  I have also seen and assessed the patient and agree with the above note. GI sxs improving Cough main c/o now CXR neg, pharynx clear Advance diet Consider CT enterography but may not need any other studies  Gatha Mayer, MD, [facility] [phone] (pager) 10/05/2014 5:04 PM

## 2014-10-05 NOTE — Progress Notes (Signed)
Physical Therapy Treatment Patient Details Name: [name] MRN: [id number] DOB: May 01, 1919 Today's Date: 10/05/2014    History of Present Illness 80 yo female admitted with SBO, abd pain. Hx of bil LE, DVT, HTN, DM, polymyalgia rheumatica,RLS, PE. Pt is home alone with a caregiver 3x/week    PT Comments    Pt in bed with increased cough.  RN reports pt going down for a chest X ray but Okay to work with her.  Assisted pt OOB to [facility] per pt request.  Assisted with hygiene.  Amb in hallway twice with one sitting rest break.  RA avg 96%.  Assisted to recliner.  Unable to attempt stairs due to increased c/o dyspnea.  Follow Up Recommendations  Home health PT;Supervision - Intermittent     Equipment Recommendations  None recommended by PT    Recommendations for Other Services       Precautions / Restrictions Precautions Precautions: Fall Restrictions Weight Bearing Restrictions: No    Mobility  Bed Mobility Overal bed mobility: Needs Assistance Bed Mobility: Supine to Sit     Supine to sit: Min guard     General bed mobility comments: increased time  Transfers Overall transfer level: Needs assistance Equipment used: Rolling walker (2 wheeled) Transfers: Sit to/from Stand Sit to Stand: Supervision;Min guard         General transfer comment: assist to rise, stabilize, control descent, maneuver with RW. VCs safety, technique, hand placement.  Assisted from bed to [facility].  Ambulation/Gait Ambulation/Gait assistance: Min guard;Min assist Ambulation Distance (Feet): 300 Feet (150 x 2 one sitting rest break) Assistive device: Rolling walker (2 wheeled) Gait Pattern/deviations: Step-through pattern;Trunk flexed Gait velocity: WFL   General Gait Details: good alternating gait and speed.  Min c/o fatigue.  Tolerated well.    Stairs            Wheelchair Mobility    Modified Rankin (Stroke Patients Only)       Balance                                     Cognition                            Exercises      General Comments        Pertinent Vitals/Pain Pain Assessment: No/denies pain    Home Living                      Prior Function            PT Goals (current goals can now be found in the care plan section) Progress towards PT goals: Progressing toward goals    Frequency  Min 3X/week    PT Plan      Co-evaluation             End of Session Equipment Utilized During Treatment: Gait belt Activity Tolerance: Patient tolerated treatment well Patient left: in chair;with call bell/phone within reach     Time: [id number] PT Time Calculation (min): 21 min  Charges:  $Gait Training: 8-22 mins                    G Codes:      Rica Koyanagi  PTA [facility]  Acute  Rehab Pager      [phone]

## 2014-10-05 NOTE — Progress Notes (Signed)
OT Cancellation Note  Patient Details Name: [name] MRN: [id number] DOB: 1919-02-22   Cancelled Treatment:    Reason Eval/Treat Not Completed: Fatigue/lethargy limiting ability to participate Will check on pt later in day or next day  Betsy Pries 10/05/2014, 12:38 PM

## 2014-10-05 NOTE — Progress Notes (Signed)
[facility] PROGRESS NOTE  [name] IDP:[id number] DOB: 10/13/1919 DOA: 10/01/2014 PCP: Benito Mccreedy, MD  Brief Summary  [name] is a 80 y.o. female with medical history significant for bilateral lower limb DVT on chronic anticoagulation with Xarelto, hypertension, diabetes, overactive bladder, GERD, duodenitis, diverticulosis who presented to the ED with periumbilical and epigastric abdominal pain, nausea, vomiting, and diarrhea with dark colored stool.  She lives at home alone. She has a neighbor who does her grocery shopping, and she does not cook because she is not able to stand for long time due to bilateral lower limb DVT. She ambulates with a walker. She had positive stool occult blood in the ED.  CT scan demonstrated mildly dilated proximal small bowel loops wtihout an obvious transition point which could reflex early small bowel obstruction vs. Proximal ileus or enteritis.  There was severe sigmoid colon diverticulosis with possible diverticulitis.  She was started on bowel rest with IVF, antibiotics, and antiemetics.  F/u KUB on 10/27 demonstrated moderate stool in the colon and bowel rest.  Advancing diet and starting bowel regimen.    Assessment/Plan Abdominal pain:  due to possible early partial small bowel obstruction, enteritis, ileus, or diverticulitis.  Patient with worsening abdominal pain today and nausea.  -KUB negative for obstruction.  -Change protonix to IV.  - Continue IVF  - Continue antiemetics - Oral pain medication with IV for breakthrough - Continue ciprofloxacin/flagyl day 6. -GI consulted. Appreciate recommendation. Follow GI pathogen, might need steroid for crohn flare. Might need CT enterography.  -GI pathogen pending.   UTI -  Continue abx as above - Klebsiella sensitive to cipro,.   Cough; chest x ray negative. Start guaifenesin.   GERD, stable, continue protonix IV  Overactive bladder, hold myrbetriq  DVT, continue lovenox  until clearly tolerating diet  DM type 2 with somewhat low CBG despite dextrose fluids -  Hold metformin -  Change SSI to meal coverage -  Continue dextrose in IVF until tolerating diet  HTN, blood pressure low normal.  Continue to hold ARB  Insomnia, stable, on  remeron   RLS, stable, on requip  Severe malnutrition  -  Appreciate nutrition recommendations -  Start glucerna QID  Diet:  CLD  Access:  PIV IVF:  yes Proph:  lovenox  Code Status: full Family Communication: patient alone Disposition Plan: to be determine.    Consultants:  None  Procedures:  CT abd/pelvis  CXR  Antibiotics:  cipro 10/26 >>    Flagyl 10/26 >>  HPI/Subjective: She is feeling better today than yesterday, pain better, nausea improved. Had BM last night.  oughing overnight. After she cough she vomits.   Objective: Filed Vitals:   10/04/14 1405 10/04/14 2152 10/05/14 0154 10/05/14 0546  BP: 168/74 123/78 147/64 147/61  Pulse: 58 60 64 69  Temp: 98.4 F (36.9 C) 98.2 F (36.8 C) 98.2 F (36.8 C) 98.3 F (36.8 C)  TempSrc:  Oral Oral Oral  Resp: 16 16 16 16   Height:      Weight:      SpO2: 100% 97% 95% 97%    Intake/Output Summary (Last 24 hours) at 10/05/14 1342 Last data filed at 10/05/14 1000  Gross per 24 hour  Intake   1160 ml  Output    300 ml  Net    860 ml   Filed Weights   10/02/14 1700  Weight: 47.809 kg (105 lb 6.4 oz)    Exam:   General:  Frail white  female, No acute distress  HEENT:  NCAT, MMM  Cardiovascular:  RRR, nl S1, S2 no mrg.   Respiratory:  CTAB, no increased WOB  Abdomen:   NABS, soft, ND, NT  MSK:   Normal tone and bulk, no LEE  Neuro:  Grossly intact  Data Reviewed: Basic Metabolic Panel:  Recent Labs Lab 10/01/14 1744 10/02/14 0509 10/03/14 0440 10/04/14 0403 10/05/14 0514  NA 139 139 138 142 140  K 4.0 4.2 3.9 3.9 3.9  CL 101 106 105 107 105  CO2 25 22 22 23 26   GLUCOSE 145* 103* 103* 128* 131*  BUN 26* 22 19 16  11   CREATININE 0.81 0.75 0.95 0.96 1.01  CALCIUM 9.2 8.3* 8.5 8.1* 8.3*   Liver Function Tests:  Recent Labs Lab 10/01/14 1744 10/02/14 0509  AST 28 24  ALT 18 13  ALKPHOS 92 78  BILITOT 0.3 0.3  PROT 6.7 5.6*  ALBUMIN 3.5 2.9*    Recent Labs Lab 10/01/14 1744 10/04/14 0456  LIPASE 22 17   No results found for this basename: AMMONIA,  in the last 168 hours CBC:  Recent Labs Lab 10/01/14 1744 10/02/14 0509 10/03/14 0440 10/04/14 0403 10/05/14 0514  WBC 5.7 6.8 5.1 4.8 4.1  NEUTROABS 4.6  --   --   --   --   HGB 11.0* 9.4* 10.2* 9.9* 9.9*  HCT 33.9* 29.0* 31.2* 30.0* 30.2*  MCV 91.9 90.9 91.8 89.8 91.2  PLT 217 210 175 179 156   Cardiac Enzymes: No results found for this basename: CKTOTAL, CKMB, CKMBINDEX, TROPONINI,  in the last 168 hours BNP (last 3 results) No results found for this basename: PROBNP,  in the last 8760 hours CBG:  Recent Labs Lab 10/04/14 1215 10/04/14 1726 10/04/14 2141 10/05/14 0750 10/05/14 1135  GLUCAP 124* 117* 141* 120* 152*    Recent Results (from the past 240 hour(s))  URINE CULTURE     Status: None   Collection Time    10/01/14  6:39 PM      Result Value Ref Range Status   Specimen Description URINE, CLEAN CATCH   Final   Special Requests Normal   Final   Culture  Setup Time     Final   Value: 10/02/2014 05:57     Performed at [facility]     Final   Value: >=100,000 COLONIES/ML     Performed at [facility]   Culture     Final   Value: KLEBSIELLA PNEUMONIAE     Performed at [facility]   Report Status 10/04/2014 FINAL   Final   Organism ID, Bacteria KLEBSIELLA PNEUMONIAE   Final     Studies: Dg Chest 2 View  10/05/2014   CLINICAL DATA:  Cough, hypertension  EXAM: CHEST  2 VIEW  COMPARISON:  10/01/2014  FINDINGS: Cardiomediastinal silhouette is stable. Hyperinflation again noted. Moderate size hiatal hernia. No acute infiltrate or pulmonary edema. Thoracic spine  osteopenia. Mild degenerative changes thoracic spine.  IMPRESSION: No active disease.  Hyperinflation again noted.   Electronically Signed   By: Lahoma Crocker M.D.   On: 10/05/2014 12:03   Dg Abd 1 View  10/04/2014   CLINICAL DATA:  Abdominal pain.  EXAM: ABDOMEN - 1 VIEW  COMPARISON:  Radiograph dated 10/03/2014 and CT scan dated 10/01/2014  FINDINGS: Bowel gas pattern is normal. No visible free air or free fluid on this supine radiograph. No acute osseous abnormality. Previous fusions  in the lower lumbar spine.  IMPRESSION: Benign appearing abdomen.   Electronically Signed   By: Rozetta Nunnery M.D.   On: 10/04/2014 12:06    Scheduled Meds: . ciprofloxacin  400 mg Intravenous Q12H  . enoxaparin (LOVENOX) injection  1 mg/kg Subcutaneous Q24H  . feeding supplement (GLUCERNA SHAKE)  237 mL Oral TID PC & HS  . insulin aspart  0-9 Units Subcutaneous TID WC  . levothyroxine  25 mcg Oral QAC breakfast  . metronidazole  500 mg Intravenous 3 times per day  . mirtazapine  7.5 mg Oral QHS  . ondansetron (ZOFRAN) IV  4 mg Intravenous 3 times per day   Or  . ondansetron  4 mg Oral 3 times per day  . pantoprazole (PROTONIX) IV  40 mg Intravenous Q12H  . polyethylene glycol  17 g Oral BID  . rOPINIRole  5 mg Oral QHS   Continuous Infusions: . dextrose 5 % and 0.45 % NaCl with KCl 10 mEq/L 50 mL/hr at 10/03/14 0786    Principal Problem:   Abdominal pain, epigastric Active Problems:   RESTLESS LEG SYNDROME   Diverticulosis of large intestine   DVT of leg (deep venous thrombosis)   Hypothyroidism   Hx pulmonary embolism   Enteritis   Protein-calorie malnutrition, severe   Heme + stool   Crohn's ileocolitis    Time spent: 30 min    Burrel Legrand, [facility] Pager [phone]. If 7PM-7AM, please contact night-coverage at [email or web address], password [facility] 10/05/2014, 1:42 PM  LOS: 4 days

## 2014-10-06 ENCOUNTER — Encounter (HOSPITAL_COMMUNITY): Payer: Self-pay | Admitting: Internal Medicine

## 2014-10-06 ENCOUNTER — Ambulatory Visit (HOSPITAL_COMMUNITY): Payer: Medicare Other

## 2014-10-06 DIAGNOSIS — K449 Diaphragmatic hernia without obstruction or gangrene: Secondary | ICD-10-CM

## 2014-10-06 DIAGNOSIS — Z8719 Personal history of other diseases of the digestive system: Secondary | ICD-10-CM

## 2014-10-06 HISTORY — DX: Diaphragmatic hernia without obstruction or gangrene: K44.9

## 2014-10-06 LAB — CBC
HCT: 31.2 % — ABNORMAL LOW (ref 36.0–46.0)
HEMOGLOBIN: 10.2 g/dL — AB (ref 12.0–15.0)
MCH: 29.3 pg (ref 26.0–34.0)
MCHC: 32.7 g/dL (ref 30.0–36.0)
MCV: 89.7 fL (ref 78.0–100.0)
Platelets: 184 10*3/uL (ref 150–400)
RBC: 3.48 MIL/uL — ABNORMAL LOW (ref 3.87–5.11)
RDW: 14.1 % (ref 11.5–15.5)
WBC: 4 10*3/uL (ref 4.0–10.5)

## 2014-10-06 LAB — BASIC METABOLIC PANEL
Anion gap: 10 (ref 5–15)
BUN: 10 mg/dL (ref 6–23)
CALCIUM: 8.3 mg/dL — AB (ref 8.4–10.5)
CHLORIDE: 105 meq/L (ref 96–112)
CO2: 26 mEq/L (ref 19–32)
Creatinine, Ser: 1.01 mg/dL (ref 0.50–1.10)
GFR calc Af Amer: 54 mL/min — ABNORMAL LOW (ref >90)
GFR calc non Af Amer: 46 mL/min — ABNORMAL LOW (ref >90)
GLUCOSE: 125 mg/dL — AB (ref 70–99)
Potassium: 3.9 mEq/L (ref 3.7–5.3)
SODIUM: 141 meq/L (ref 137–147)

## 2014-10-06 LAB — GLUCOSE, CAPILLARY
GLUCOSE-CAPILLARY: 116 mg/dL — AB (ref 70–99)
GLUCOSE-CAPILLARY: 107 mg/dL — AB (ref 70–99)
Glucose-Capillary: 151 mg/dL — ABNORMAL HIGH (ref 70–99)
Glucose-Capillary: 181 mg/dL — ABNORMAL HIGH (ref 70–99)

## 2014-10-06 MED ORDER — HYDROCOD POLST-CHLORPHEN POLST 10-8 MG/5ML PO LQCR
5.0000 mL | Freq: Once | ORAL | Status: AC
  Administered 2014-10-06: 5 mL via ORAL
  Filled 2014-10-06: qty 5

## 2014-10-06 MED ORDER — IOHEXOL 300 MG/ML  SOLN: 25.0000 mL | INTRAMUSCULAR | Status: DC

## 2014-10-06 MED ORDER — LIDOCAINE 5 % EX PTCH
1.0000 | MEDICATED_PATCH | CUTANEOUS | Status: AC
  Administered 2014-10-06: 1 via TRANSDERMAL
  Filled 2014-10-06: qty 1

## 2014-10-06 MED ORDER — IOHEXOL 300 MG/ML  SOLN
100.0000 mL | Freq: Once | INTRAMUSCULAR | Status: AC | PRN
  Administered 2014-10-06: 100 mL via INTRAVENOUS

## 2014-10-06 MED ORDER — KETOROLAC TROMETHAMINE 15 MG/ML IJ SOLN
15.0000 mg | Freq: Four times a day (QID) | INTRAMUSCULAR | Status: DC
  Administered 2014-10-06 (×2): 15 mg via INTRAVENOUS
  Filled 2014-10-06 (×5): qty 1

## 2014-10-06 MED ORDER — METHYLPREDNISOLONE SODIUM SUCC 40 MG IJ SOLR
20.0000 mg | Freq: Every day | INTRAMUSCULAR | Status: DC
  Administered 2014-10-06: 20 mg via INTRAVENOUS
  Filled 2014-10-06: qty 0.5

## 2014-10-06 NOTE — Progress Notes (Addendum)
Patient ID: [name], female   DOB: Apr 21, 1919, 80 y.o.   MRN: [id number]  [facility] Progress Note  Subjective: Still coughing, doesn't feel well- still hurting mid abdomen-epigastrium- she has drawn a circle around it with a pen on her  Abdomen...  Objective:  Vital signs in last 24 hours: Temp:  [98 F (36.7 C)-99.1 F (37.3 C)] 98.3 F (36.8 C) (10/30 0506) Pulse Rate:  [58-81] 81 (10/30 0506) Resp:  [14-16] 14 (10/30 0506) BP: (116-136)/(51-81) 129/81 mmHg (10/30 0506) SpO2:  [92 %-97 %] 92 % (10/30 0506) Last BM Date: 10/05/14 General:   Alert,  Well-developed, elderly WF   in NAD Heart:  Regular rate and rhythm; no murmurs Pulm;clear-few rhonchi Abdomen:  Soft, tender upper abdomen and nondistended. Normal bowel sounds, without guarding, and without rebound.   Extremities:  Without edema. Neurologic:  Alert and  oriented x4;  grossly normal neurologically. Psych:  Alert and cooperative. Normal mood and affect.  Lab Results:  Recent Labs  10/04/14 0403 10/05/14 0514 10/06/14 0514  WBC 4.8 4.1 4.0  HGB 9.9* 9.9* 10.2*  HCT 30.0* 30.2* 31.2*  PLT 179 156 184   BMET  Recent Labs  10/04/14 0403 10/05/14 0514 10/06/14 0514  NA 142 140 141  K 3.9 3.9 3.9  CL 107 105 105  CO2 23 26 26   GLUCOSE 128* 131* 125*  BUN 16 11 10   CREATININE 0.96 1.01 1.01  CALCIUM 8.1* 8.3* 8.3*    Assessment / Plan: #1  80 yo female with persistent upper abdominal pain, nausea- diarrhea has reolved- GI pathogen panel negative.  Will go ahead with Ct enterography to assess for Crohns Start low dose steroids- solumedrol 20 daily #2 Cough #3 hx dvt/PE Principal Problem:   Abdominal pain, epigastric       LOS: 5 days   Amy Esterwood  10/06/2014, 8:43 AM  Attending MD  She is tender on xiphoid and below in epigastrium with variable exam and overall has a benign exam "It hurts all the time"  Began suddenly 6 days ago Worse after the nausea and  vomiting  The CT enterography is negative for Crohn's - she does have a moderate to large hiatal hernia that has been seen before on CT so that could be causing some pain. Did not look largeon EGD last year but scope could have reduced. She had diarrhea after CT - not surprising after contrast ingestion   there is a string of ED visits for abd pain, nausea and vomiting - could be from the hiatal hernia Could have active Crohn's but seems more like she had a gastroenteritis and then resultant after effects or symptoms from the hiatal hernia but should not have had diarrhea from that  cough may be aggravating upper abdominal pain by straining mm Will try Lidoderm patch and Toradol  Schedule Upper GI series for AM  DC steroids    Gatha Mayer, MD, [facility] [phone] (pager) 10/06/2014 4:21 PM

## 2014-10-06 NOTE — Progress Notes (Signed)
Dr. Carlean Purl notified that pt will not have UGI until Monday per radiology.  Orders received to cancel UGI and put pt back on her soft diet.

## 2014-10-06 NOTE — Progress Notes (Signed)
[facility] PROGRESS NOTE  [name] IDP:[id number] DOB: 1919/05/28 DOA: 10/01/2014 PCP: Benito Mccreedy, MD  Brief Summary  [name] is a 80 y.o. female with medical history significant for bilateral lower limb DVT on chronic anticoagulation with Xarelto, hypertension, diabetes, overactive bladder, GERD, duodenitis, diverticulosis who presented to the ED with periumbilical and epigastric abdominal pain, nausea, vomiting, and diarrhea with dark colored stool.  She lives at home alone. She has a neighbor who does her grocery shopping, and she does not cook because she is not able to stand for long time due to bilateral lower limb DVT. She ambulates with a walker. She had positive stool occult blood in the ED.  CT scan demonstrated mildly dilated proximal small bowel loops wtihout an obvious transition point which could reflex early small bowel obstruction vs. Proximal ileus or enteritis.  There was severe sigmoid colon diverticulosis with possible diverticulitis.  She was started on bowel rest with IVF, antibiotics, and antiemetics.  F/u KUB on 10/27 demonstrated moderate stool in the colon and bowel rest.  Advancing diet and starting bowel regimen.    Assessment/Plan Abdominal pain:  due to possible early partial small bowel obstruction, enteritis, ileus, or diverticulitis.  Patient with worsening abdominal pain today and nausea.  -KUB negative for obstruction.  -Change protonix to IV.  - Continue IVF  - Continue antiemetics - Oral pain medication with IV for breakthrough - Continue ciprofloxacin/flagyl day 7. -GI consulted. Appreciate recommendation. Follow GI pathogen -GI pathogen negative.  -CT negative for crohn.  GI series tomorrow.   UTI -  Continue abx as above - Klebsiella sensitive to cipro,.   Cough; chest x ray negative. Started  guaifenesin. Improved.   GERD, stable, continue protonix IV  Overactive bladder, hold myrbetriq  DVT, continue lovenox  until clearly tolerating diet  DM type 2 with somewhat low CBG despite dextrose fluids -  Hold metformin -  Change SSI to meal coverage -  Continue dextrose in IVF until tolerating diet  HTN, blood pressure low normal.  Continue to hold ARB  Insomnia, stable, on  remeron   RLS, stable, on requip  Severe malnutrition  -  Appreciate nutrition recommendations -  Start glucerna QID  Diet:  CLD  Access:  PIV IVF:  yes Proph:  lovenox  Code Status: full Family Communication: patient alone Disposition Plan: to be determine.    Consultants:  None  Procedures:  CT abd/pelvis  CXR  Antibiotics:  cipro 10/26 >>    Flagyl 10/26 >>  HPI/Subjective: Pain better. Cough improved. Still not feeling well.   Objective: Filed Vitals:   10/05/14 1400 10/05/14 2127 10/06/14 0506 10/06/14 1404  BP: 136/51 116/76 129/81 149/63  Pulse: 58 70 81 60  Temp: 98 F (36.7 C) 99.1 F (37.3 C) 98.3 F (36.8 C) 97.8 F (36.6 C)  TempSrc: Oral Oral Oral   Resp: 16 14 14 18   Height:      Weight:      SpO2: 97% 93% 92% 93%    Intake/Output Summary (Last 24 hours) at 10/06/14 1741 Last data filed at 10/06/14 1404  Gross per 24 hour  Intake 1484.17 ml  Output    150 ml  Net 1334.17 ml   Filed Weights   10/02/14 1700  Weight: 47.809 kg (105 lb 6.4 oz)    Exam:   General:  Frail white female, No acute distress  HEENT:  NCAT, MMM  Cardiovascular:  RRR, nl S1, S2 no mrg.  Respiratory:  CTAB, no increased WOB  Abdomen:   NABS, soft, ND, NT  MSK:   Normal tone and bulk, no LEE  Neuro:  Grossly intact  Data Reviewed: Basic Metabolic Panel:  Recent Labs Lab 10/02/14 0509 10/03/14 0440 10/04/14 0403 10/05/14 0514 10/06/14 0514  NA 139 138 142 140 141  K 4.2 3.9 3.9 3.9 3.9  CL 106 105 107 105 105  CO2 22 22 23 26 26   GLUCOSE 103* 103* 128* 131* 125*  BUN 22 19 16 11 10   CREATININE 0.75 0.95 0.96 1.01 1.01  CALCIUM 8.3* 8.5 8.1* 8.3* 8.3*   Liver  Function Tests:  Recent Labs Lab 10/01/14 1744 10/02/14 0509  AST 28 24  ALT 18 13  ALKPHOS 92 78  BILITOT 0.3 0.3  PROT 6.7 5.6*  ALBUMIN 3.5 2.9*    Recent Labs Lab 10/01/14 1744 10/04/14 0456  LIPASE 22 17   No results found for this basename: AMMONIA,  in the last 168 hours CBC:  Recent Labs Lab 10/01/14 1744 10/02/14 0509 10/03/14 0440 10/04/14 0403 10/05/14 0514 10/06/14 0514  WBC 5.7 6.8 5.1 4.8 4.1 4.0  NEUTROABS 4.6  --   --   --   --   --   HGB 11.0* 9.4* 10.2* 9.9* 9.9* 10.2*  HCT 33.9* 29.0* 31.2* 30.0* 30.2* 31.2*  MCV 91.9 90.9 91.8 89.8 91.2 89.7  PLT 217 210 175 179 156 184   Cardiac Enzymes: No results found for this basename: CKTOTAL, CKMB, CKMBINDEX, TROPONINI,  in the last 168 hours BNP (last 3 results) No results found for this basename: PROBNP,  in the last 8760 hours CBG:  Recent Labs Lab 10/05/14 1135 10/05/14 1650 10/05/14 2125 10/06/14 0731 10/06/14 1245  GLUCAP 152* 107* 126* 116* 107*    Recent Results (from the past 240 hour(s))  URINE CULTURE     Status: None   Collection Time    10/01/14  6:39 PM      Result Value Ref Range Status   Specimen Description URINE, CLEAN CATCH   Final   Special Requests Normal   Final   Culture  Setup Time     Final   Value: 10/02/2014 05:57     Performed at [place]     Final   Value: >=100,000 COLONIES/ML     Performed at [facility]   Culture     Final   Value: KLEBSIELLA PNEUMONIAE     Performed at [facility]   Report Status 10/04/2014 FINAL   Final   Organism ID, Bacteria KLEBSIELLA PNEUMONIAE   Final     Studies: Dg Chest 2 View  10/05/2014   CLINICAL DATA:  Cough, hypertension  EXAM: CHEST  2 VIEW  COMPARISON:  10/01/2014  FINDINGS: Cardiomediastinal silhouette is stable. Hyperinflation again noted. Moderate size hiatal hernia. No acute infiltrate or pulmonary edema. Thoracic spine osteopenia. Mild degenerative changes thoracic  spine.  IMPRESSION: No active disease.  Hyperinflation again noted.   Electronically Signed   By: Lahoma Crocker M.D.   On: 10/05/2014 12:03   Ct Abdomen Pelvis W Contrast  10/06/2014   CLINICAL DATA:  Epigastric and mid abdominal pain with nausea and diarrhea. Evaluate for Crohn disease.  EXAM: CT ABDOMEN AND PELVIS WITH CONTRAST  TECHNIQUE: Multidetector CT imaging of the abdomen and pelvis was performed using the standard protocol following bolus administration of intravenous contrast.  CONTRAST:  127m OMNIPAQUE IOHEXOL 300 MG/ML  SOLN  COMPARISON:  10/01/2014, 12/24/2013 and 01/06/2010.  FINDINGS: Lower chest: Lung bases show tiny bilateral effusions with mild compressive atelectasis in both lower lobes. Heart size within normal limits. No pericardial effusion. Moderate hiatal hernia.  Hepatobiliary: 5 mm low-attenuation lesion in the dome of the liver is too small to definitively characterize but stable and therefore likely a cyst or hemangioma. Very mild intrahepatic biliary duct dilatation, unchanged from 12/24/2013.  Pancreas: Low-attenuation lesion in the body the pancreas measures 1.2 x 1.5 cm, unchanged from 01/06/2010 and therefore considered benign.  Spleen: Negative.  Adrenals/Urinary Tract: Adrenal glands and kidneys are otherwise unremarkable. Ureters are decompressed. Bladder is grossly unremarkable.  Stomach/Bowel: Moderate hiatal hernia. Pyloric channel thickening seen on portal venous phase imaging, resolved on nephrographic phase imaging. The majority of the small bowel is unremarkable. Distal/ terminal ileum is somewhat decompressed (series 3, image 71), somewhat limiting evaluation. No surrounding fat proliferation or inflammatory stranding. Colon is grossly unremarkable.  Vascular/Lymphatic: Atherosclerotic calcification of the arterial vasculature without abdominal aortic aneurysm. No pathologically enlarged lymph nodes.  Reproductive: Uterus and ovaries are visualized.  Other: No free  fluid. Mesenteries and peritoneum are grossly unremarkable.  Musculoskeletal: No worrisome lytic or sclerotic lesions. Postoperative changes are seen in the lumbar spine. Grade 1 anterolisthesis of L3 on L4 with secondary degenerative disc disease.  IMPRESSION: 1. Distal/terminal ileum is somewhat decompressed, limiting evaluation. No surrounding inflammatory changes to indicate active Crohn disease. 2. Tiny bilateral effusions. 3. Moderate hiatal hernia.   Electronically Signed   By: Lorin Picket M.D.   On: 10/06/2014 12:53    Scheduled Meds: . ciprofloxacin  400 mg Intravenous Q12H  . enoxaparin (LOVENOX) injection  1 mg/kg Subcutaneous Q24H  . feeding supplement (GLUCERNA SHAKE)  237 mL Oral TID PC & HS  . guaiFENesin  600 mg Oral BID  . insulin aspart  0-9 Units Subcutaneous TID WC  . ketorolac  15 mg Intravenous 4 times per day  . levothyroxine  25 mcg Oral QAC breakfast  . lidocaine  1 patch Transdermal Q24H  . methylPREDNISolone (SOLU-MEDROL) injection  20 mg Intravenous Daily  . metronidazole  500 mg Intravenous 3 times per day  . mirtazapine  7.5 mg Oral QHS  . ondansetron (ZOFRAN) IV  4 mg Intravenous 3 times per day   Or  . ondansetron  4 mg Oral 3 times per day  . pantoprazole (PROTONIX) IV  40 mg Intravenous Q12H  . polyethylene glycol  17 g Oral BID  . rOPINIRole  5 mg Oral QHS   Continuous Infusions: . dextrose 5 % and 0.45 % NaCl with KCl 10 mEq/L 50 mL/hr at 10/06/14 1739    Principal Problem:   Abdominal pain, epigastric Active Problems:   RESTLESS LEG SYNDROME   Diverticulosis of large intestine   DVT of leg (deep venous thrombosis)   Hypothyroidism   Hx pulmonary embolism   Enteritis   Protein-calorie malnutrition, severe   Heme + stool   Crohn's ileocolitis   Hiatal hernia- moderate to large    Time spent: 30 min    Mesha Schamberger, [facility] Pager [phone]. If 7PM-7AM, please contact night-coverage at [email or web address], password  [facility] 10/06/2014, 5:41 PM  LOS: 5 days

## 2014-10-06 NOTE — Progress Notes (Signed)
OT Cancellation Note  Patient Details Name: [name] MRN: [id number] DOB: 06/28/1919   Cancelled Treatment:    Reason Eval/Treat Not Completed: Other (comment).  NT reports pt has had diarrhea today; was on bedpan when I checked.  Will return another day.  Sameeha Rockefeller 10/06/2014, 2:13 PM Lesle Chris, OTR/L [phone] 10/06/2014

## 2014-10-06 NOTE — Progress Notes (Signed)
PT Cancellation Note  Patient Details Name: [name] MRN: [id number] DOB: 05-28-1919   Cancelled Treatment:     Cancel am PT due to pt out of room for ABD CT.                                            Cancel pm PT due to loose stools and pt requesting to get some rest "busy day"   Rica Koyanagi  PTA [facility]  Acute  Rehab Pager      [phone]

## 2014-10-07 LAB — BASIC METABOLIC PANEL
Anion gap: 9 (ref 5–15)
BUN: 16 mg/dL (ref 6–23)
CO2: 24 mEq/L (ref 19–32)
CREATININE: 1.13 mg/dL — AB (ref 0.50–1.10)
Calcium: 8 mg/dL — ABNORMAL LOW (ref 8.4–10.5)
Chloride: 102 mEq/L (ref 96–112)
GFR calc Af Amer: 47 mL/min — ABNORMAL LOW (ref >90)
GFR, EST NON AFRICAN AMERICAN: 41 mL/min — AB (ref >90)
GLUCOSE: 98 mg/dL (ref 70–99)
POTASSIUM: 4.2 meq/L (ref 3.7–5.3)
Sodium: 135 mEq/L — ABNORMAL LOW (ref 137–147)

## 2014-10-07 LAB — CBC
HCT: 28 % — ABNORMAL LOW (ref 36.0–46.0)
Hemoglobin: 9.2 g/dL — ABNORMAL LOW (ref 12.0–15.0)
MCH: 29.5 pg (ref 26.0–34.0)
MCHC: 32.9 g/dL (ref 30.0–36.0)
MCV: 89.7 fL (ref 78.0–100.0)
Platelets: 160 10*3/uL (ref 150–400)
RBC: 3.12 MIL/uL — ABNORMAL LOW (ref 3.87–5.11)
RDW: 14.1 % (ref 11.5–15.5)
WBC: 5.4 10*3/uL (ref 4.0–10.5)

## 2014-10-07 LAB — GLUCOSE, CAPILLARY
GLUCOSE-CAPILLARY: 180 mg/dL — AB (ref 70–99)
GLUCOSE-CAPILLARY: 152 mg/dL — AB (ref 70–99)
Glucose-Capillary: 106 mg/dL — ABNORMAL HIGH (ref 70–99)
Glucose-Capillary: 90 mg/dL (ref 70–99)

## 2014-10-07 NOTE — Progress Notes (Signed)
Patient ID: [name], female   DOB: 10/21/1919, 80 y.o.   MRN: [id number]  [facility] Progress Note  Subjective: Resting- says no longer having pain just sore in epigastrium. Not hungry- says she lost her taste buds years ago.   Ct abd/pelvis- moderate hiatal hernia, mild pyloric thickening,majority of small bowel normal,diatl ileum decompressed limiting eval but no evidence for active Crohns  Objective:  Vital signs in last 24 hours: Temp:  [97.8 F (36.6 C)-98.5 F (36.9 C)] 98.4 F (36.9 C) (10/31 0619) Pulse Rate:  [53-65] 53 (10/31 0619) Resp:  [16-18] 16 (10/31 0619) BP: (112-149)/(49-63) 123/49 mmHg (10/31 0619) SpO2:  [93 %-94 %] 94 % (10/31 0619) Last BM Date: 10/07/14 General:   Alert,  Well-developed,  Elderly WF  in NAD Heart:  Regular rate and rhythm; no murmurs Pulm;clear- not tender over ribs or Xyphoid Abdomen:  Soft,mildly tenderepigastrium and nondistended. Normal bowel sounds, without guarding, and without rebound.   Extremities:  Without edema. Neurologic:  Alert and  oriented x4;  grossly normal neurologically. Psych:  Alert and cooperative. Normal mood and affect.  Lab Results:  Recent Labs  10/05/14 0514 10/06/14 0514 10/07/14 0430  WBC 4.1 4.0 5.4  HGB 9.9* 10.2* 9.2*  HCT 30.2* 31.2* 28.0*  PLT 156 184 160      Assessment / Plan: #1 80 yo female with acute illness with upper abdominal   Pain,nausea,vomiting and transient diarrhea SXS resolved except persistent epigastric mild discomfort Ct unremarkable- mild pyloric thickening, no Crohns Stool studies negative   ?all gastroenteritis, may have pyloric duodenal inflammation- no further GI workup needed. Supportive management Stop cipro and flagyl Continue PPI Zofran prn We will be available   #2 PMR #3 Hx DVT/PE #4 large hiatal hernia #5 chronic anemia #6 AODM     LOS: 6 days   Amy Esterwood  10/07/2014, 11:49 AM   [facility] Attending  I have also seen  and assessed the patient and agree with the above note. I think she is recovering from an acute selkf-limited illness. Hopefully home soon. Have decided against an UGI She can f/u PCP at dc and see GR Henrene Pastor) prn  Thanks  Gatha Mayer, MD, [facility] [phone] (pager) 10/07/2014 2:18 PM

## 2014-10-07 NOTE — Progress Notes (Signed)
ANTICOAGULATION CONSULT NOTE - Follow Up  Pharmacy Consult for Lovenox Indication: h/o pulmonary embolus and DVT  Allergies  Allergen Reactions  . Sulfa Antibiotics Nausea And Vomiting  . Penicillins Hives and Rash   Patient Measurements: Height: 5\' 4"  (162.6 cm) Weight: 105 lb 6.4 oz (47.809 kg) IBW/kg (Calculated) : 54.7kg  Vital Signs: Temp: 98.5 F (36.9 C) (10/30 2150) Temp Source: Oral (10/30 2150) BP: 112/49 mmHg (10/30 2150) Pulse Rate: 65 (10/30 2150)  Labs:  Recent Labs  10/05/14 0514 10/06/14 0514 10/07/14 0430  HGB 9.9* 10.2* 9.2*  HCT 30.2* 31.2* 28.0*  PLT 156 184 160  CREATININE 1.01 1.01 1.13*   Estimated Creatinine Clearance: 23.5 ml/min (by C-G formula based on Cr of 1.13).   Assessment: 89 yoF with PMH significant for bilateral lower limb DVT on chronic anticoagulation with Xarelto 10mg  daily PTA. Admit with abdominal pain, N/V/D, NPO on admit. Pharmacy consulted to dose lovenox for VTE treatment.  -PTA Xarelto 10mg  daily (on hold) LD 10/24 -SCr rising, CrCl 23 ml/min -CBC low but stable; FOB (+) on admit. GI following, plan UGI on monday  Goal of Therapy:  Anticoagulation for patient with h/o DVT/PE Monitor platelets by anticoagulation protocol: Yes   Plan:   Continue Lovenox 50mg  q24h with CrCl < 53ml/min  Follow CBC, SCr, platelets   Follow plans to return to oral anticoagulant once tolerating diet.   Of note, per product labeling, it is recommended to avoid Xarelto use in pts with CrCl < 62min/min.   Peggyann Juba, PharmD, BCPS Pager: [phone]  10/07/2014, 7:56 AM

## 2014-10-07 NOTE — Progress Notes (Signed)
[facility] PROGRESS NOTE  DANEL STUDZINSKI OPF:[id number] DOB: [birth year] DOA: 10/01/2014 PCP: Benito Mccreedy, MD  Brief Summary  [name] is a 80 y.o. female with medical history significant for bilateral lower limb DVT on chronic anticoagulation with Xarelto, hypertension, diabetes, overactive bladder, GERD, duodenitis, diverticulosis who presented to the ED with periumbilical and epigastric abdominal pain, nausea, vomiting, and diarrhea with dark colored stool.  She lives at home alone. She has a neighbor who does her grocery shopping, and she does not cook because she is not able to stand for long time due to bilateral lower limb DVT. She ambulates with a walker. She had positive stool occult blood in the ED.  CT scan demonstrated mildly dilated proximal small bowel loops wtihout an obvious transition point which could reflex early small bowel obstruction vs. Proximal ileus or enteritis.  There was severe sigmoid colon diverticulosis with possible diverticulitis.  She was started on bowel rest with IVF, antibiotics, and antiemetics.  F/u KUB on 10/27 demonstrated moderate stool in the colon and bowel rest.  Advancing diet and starting bowel regimen.    Assessment/Plan Abdominal pain:  due to possible early partial small bowel obstruction, enteritis, ileus, or diverticulitis.  Patient with worsening abdominal pain today and nausea.  -KUB negative for obstruction.  -Change protonix to IV.  - Continue IVF  - Continue antiemetics - Received  ciprofloxacin/flagyl day 7. -GI consulted. Appreciate recommendation.  -GI pathogen negative.  -CT negative for crohn.  -Advance diet today.   UTI -klebsiella, received 7 days cipro.   Cough; chest x ray negative. Started  guaifenesin. Improved.   GERD, stable, continue protonix IV  Overactive bladder, hold myrbetriq  DVT, continue lovenox until clearly tolerating diet  DM type 2 with somewhat low CBG despite dextrose fluids -   Hold metformin -  Change SSI to meal coverage -  Continue dextrose in IVF until tolerating diet  HTN, blood pressure low normal.  Continue to hold ARB  Insomnia, stable, on  remeron   RLS, stable, on requip  Severe malnutrition  -  Appreciate nutrition recommendations -  Start glucerna QID  Diet:  CLD  Access:  PIV IVF:  yes Proph:  lovenox  Code Status: full Family Communication: patient alone Disposition Plan: to be determine.    Consultants:  None  Procedures:  CT abd/pelvis  CXR  Antibiotics:  cipro 10/26 >>    Flagyl 10/26 >>  HPI/Subjective: Feeling weak tire. No significant abdominal pain.   Objective: Filed Vitals:   10/06/14 0506 10/06/14 1404 10/06/14 2150 10/07/14 0619  BP: 129/81 149/63 112/49 123/49  Pulse: 81 60 65 53  Temp: 98.3 F (36.8 C) 97.8 F (36.6 C) 98.5 F (36.9 C) 98.4 F (36.9 C)  TempSrc: Oral  Oral Oral  Resp: 14 18 16 16   Height:      Weight:      SpO2: 92% 93% 94% 94%    Intake/Output Summary (Last 24 hours) at 10/07/14 1414 Last data filed at 10/07/14 1336  Gross per 24 hour  Intake 1235.83 ml  Output      0 ml  Net 1235.83 ml   Filed Weights   10/02/14 1700  Weight: 47.809 kg (105 lb 6.4 oz)    Exam:   General:  Frail white female, No acute distress  HEENT:  NCAT, MMM  Cardiovascular:  RRR, nl S1, S2 no mrg.   Respiratory:  CTAB, no increased WOB  Abdomen:   NABS, soft, ND, NT  MSK:   Normal tone and bulk, no LEE  Neuro:  Grossly intact  Data Reviewed: Basic Metabolic Panel:  Recent Labs Lab 10/03/14 0440 10/04/14 0403 10/05/14 0514 10/06/14 0514 10/07/14 0430  NA 138 142 140 141 135*  K 3.9 3.9 3.9 3.9 4.2  CL 105 107 105 105 102  CO2 22 23 26 26 24   GLUCOSE 103* 128* 131* 125* 98  BUN 19 16 11 10 16   CREATININE 0.95 0.96 1.01 1.01 1.13*  CALCIUM 8.5 8.1* 8.3* 8.3* 8.0*   Liver Function Tests:  Recent Labs Lab 10/01/14 1744 10/02/14 0509  AST 28 24  ALT 18 13  ALKPHOS  92 78  BILITOT 0.3 0.3  PROT 6.7 5.6*  ALBUMIN 3.5 2.9*    Recent Labs Lab 10/01/14 1744 10/04/14 0456  LIPASE 22 17   No results found for this basename: AMMONIA,  in the last 168 hours CBC:  Recent Labs Lab 10/01/14 1744  10/03/14 0440 10/04/14 0403 10/05/14 0514 10/06/14 0514 10/07/14 0430  WBC 5.7  < > 5.1 4.8 4.1 4.0 5.4  NEUTROABS 4.6  --   --   --   --   --   --   HGB 11.0*  < > 10.2* 9.9* 9.9* 10.2* 9.2*  HCT 33.9*  < > 31.2* 30.0* 30.2* 31.2* 28.0*  MCV 91.9  < > 91.8 89.8 91.2 89.7 89.7  PLT 217  < > 175 179 156 184 160  < > = values in this interval not displayed. Cardiac Enzymes: No results found for this basename: CKTOTAL, CKMB, CKMBINDEX, TROPONINI,  in the last 168 hours BNP (last 3 results) No results found for this basename: PROBNP,  in the last 8760 hours CBG:  Recent Labs Lab 10/06/14 1245 10/06/14 1650 10/06/14 2148 10/07/14 0814 10/07/14 1206  GLUCAP 107* 151* 181* 106* 180*    Recent Results (from the past 240 hour(s))  URINE CULTURE     Status: None   Collection Time    10/01/14  6:39 PM      Result Value Ref Range Status   Specimen Description URINE, CLEAN CATCH   Final   Special Requests Normal   Final   Culture  Setup Time     Final   Value: 10/02/2014 05:57     Performed at [place]     Final   Value: >=100,000 COLONIES/ML     Performed at [facility]   Culture     Final   Value: KLEBSIELLA PNEUMONIAE     Performed at [facility]   Report Status 10/04/2014 FINAL   Final   Organism ID, Bacteria KLEBSIELLA PNEUMONIAE   Final     Studies: Ct Abdomen Pelvis W Contrast  10/06/2014   CLINICAL DATA:  Epigastric and mid abdominal pain with nausea and diarrhea. Evaluate for Crohn disease.  EXAM: CT ABDOMEN AND PELVIS WITH CONTRAST  TECHNIQUE: Multidetector CT imaging of the abdomen and pelvis was performed using the standard protocol following bolus administration of intravenous  contrast.  CONTRAST:  179m OMNIPAQUE IOHEXOL 300 MG/ML  SOLN  COMPARISON:  10/01/2014, 12/24/2013 and 01/06/2010.  FINDINGS: Lower chest: Lung bases show tiny bilateral effusions with mild compressive atelectasis in both lower lobes. Heart size within normal limits. No pericardial effusion. Moderate hiatal hernia.  Hepatobiliary: 5 mm low-attenuation lesion in the dome of the liver is too small to definitively characterize but stable and therefore likely a cyst or hemangioma. Very  mild intrahepatic biliary duct dilatation, unchanged from 12/24/2013.  Pancreas: Low-attenuation lesion in the body the pancreas measures 1.2 x 1.5 cm, unchanged from 01/06/2010 and therefore considered benign.  Spleen: Negative.  Adrenals/Urinary Tract: Adrenal glands and kidneys are otherwise unremarkable. Ureters are decompressed. Bladder is grossly unremarkable.  Stomach/Bowel: Moderate hiatal hernia. Pyloric channel thickening seen on portal venous phase imaging, resolved on nephrographic phase imaging. The majority of the small bowel is unremarkable. Distal/ terminal ileum is somewhat decompressed (series 3, image 71), somewhat limiting evaluation. No surrounding fat proliferation or inflammatory stranding. Colon is grossly unremarkable.  Vascular/Lymphatic: Atherosclerotic calcification of the arterial vasculature without abdominal aortic aneurysm. No pathologically enlarged lymph nodes.  Reproductive: Uterus and ovaries are visualized.  Other: No free fluid. Mesenteries and peritoneum are grossly unremarkable.  Musculoskeletal: No worrisome lytic or sclerotic lesions. Postoperative changes are seen in the lumbar spine. Grade 1 anterolisthesis of L3 on L4 with secondary degenerative disc disease.  IMPRESSION: 1. Distal/terminal ileum is somewhat decompressed, limiting evaluation. No surrounding inflammatory changes to indicate active Crohn disease. 2. Tiny bilateral effusions. 3. Moderate hiatal hernia.   Electronically Signed    By: Lorin Picket M.D.   On: 10/06/2014 12:53    Scheduled Meds: . enoxaparin (LOVENOX) injection  1 mg/kg Subcutaneous Q24H  . feeding supplement (GLUCERNA SHAKE)  237 mL Oral TID PC & HS  . guaiFENesin  600 mg Oral BID  . insulin aspart  0-9 Units Subcutaneous TID WC  . levothyroxine  25 mcg Oral QAC breakfast  . mirtazapine  7.5 mg Oral QHS  . ondansetron (ZOFRAN) IV  4 mg Intravenous 3 times per day   Or  . ondansetron  4 mg Oral 3 times per day  . pantoprazole (PROTONIX) IV  40 mg Intravenous Q12H  . rOPINIRole  5 mg Oral QHS   Continuous Infusions: . dextrose 5 % and 0.45 % NaCl with KCl 10 mEq/L 50 mL/hr at 10/06/14 1739    Principal Problem:   Abdominal pain, epigastric Active Problems:   RESTLESS LEG SYNDROME   Diverticulosis of large intestine   DVT of leg (deep venous thrombosis)   Hypothyroidism   Hx pulmonary embolism   Enteritis   Protein-calorie malnutrition, severe   Heme + stool   Crohn's ileocolitis   Hiatal hernia- moderate to large    Time spent: 30 min    Prentice Sackrider, [facility] Pager [phone]. If 7PM-7AM, please contact night-coverage at [email or web address], password [employer] 10/07/2014, 2:14 PM  LOS: 6 days

## 2014-10-08 ENCOUNTER — Inpatient Hospital Stay (HOSPITAL_COMMUNITY): Payer: Medicare Other

## 2014-10-08 LAB — CBC
HCT: 30.9 % — ABNORMAL LOW (ref 36.0–46.0)
Hemoglobin: 10.1 g/dL — ABNORMAL LOW (ref 12.0–15.0)
MCH: 29.4 pg (ref 26.0–34.0)
MCHC: 32.7 g/dL (ref 30.0–36.0)
MCV: 89.8 fL (ref 78.0–100.0)
PLATELETS: 184 10*3/uL (ref 150–400)
RBC: 3.44 MIL/uL — AB (ref 3.87–5.11)
RDW: 14.2 % (ref 11.5–15.5)
WBC: 5.6 10*3/uL (ref 4.0–10.5)

## 2014-10-08 LAB — BASIC METABOLIC PANEL
ANION GAP: 10 (ref 5–15)
BUN: 12 mg/dL (ref 6–23)
CO2: 23 mEq/L (ref 19–32)
Calcium: 7.9 mg/dL — ABNORMAL LOW (ref 8.4–10.5)
Chloride: 110 mEq/L (ref 96–112)
Creatinine, Ser: 0.98 mg/dL (ref 0.50–1.10)
GFR, EST AFRICAN AMERICAN: 56 mL/min — AB (ref >90)
GFR, EST NON AFRICAN AMERICAN: 48 mL/min — AB (ref >90)
Glucose, Bld: 133 mg/dL — ABNORMAL HIGH (ref 70–99)
POTASSIUM: 4 meq/L (ref 3.7–5.3)
SODIUM: 143 meq/L (ref 137–147)

## 2014-10-08 LAB — GLUCOSE, CAPILLARY
Glucose-Capillary: 126 mg/dL — ABNORMAL HIGH (ref 70–99)
Glucose-Capillary: 116 mg/dL — ABNORMAL HIGH (ref 70–99)
Glucose-Capillary: 164 mg/dL — ABNORMAL HIGH (ref 70–99)

## 2014-10-08 MED ORDER — BENZONATATE 100 MG PO CAPS: 200.0000 mg | ORAL_CAPSULE | Freq: Two times a day (BID) | ORAL | Status: DC | PRN

## 2014-10-08 NOTE — Plan of Care (Signed)
Problem: Phase II Progression Outcomes Goal: Progress activity as tolerated unless otherwise ordered Outcome: Completed/Met Date Met:  10/08/14     

## 2014-10-08 NOTE — Plan of Care (Signed)
Problem: Phase I Progression Outcomes Goal: OOB as tolerated unless otherwise ordered Outcome: Completed/Met Date Met:  10/08/14 Goal: Other Phase I Outcomes/Goals Outcome: Completed/Met Date Met:  10/08/14

## 2014-10-08 NOTE — Progress Notes (Signed)
[facility] PROGRESS NOTE  [name] JTT:[id number] DOB: 1919/08/22 DOA: 10/01/2014 PCP: Benito Mccreedy, MD  Brief Summary  [name] is a 80 y.o. female with medical history significant for bilateral lower limb DVT on chronic anticoagulation with Xarelto, hypertension, diabetes, overactive bladder, GERD, duodenitis, diverticulosis who presented to the ED with periumbilical and epigastric abdominal pain, nausea, vomiting, and diarrhea with dark colored stool.  She lives at home alone. She has a neighbor who does her grocery shopping, and she does not cook because she is not able to stand for long time due to bilateral lower limb DVT. She ambulates with a walker. She had positive stool occult blood in the ED.  CT scan demonstrated mildly dilated proximal small bowel loops wtihout an obvious transition point which could reflex early small bowel obstruction vs. Proximal ileus or enteritis.  There was severe sigmoid colon diverticulosis with possible diverticulitis.  She was started on bowel rest with IVF, antibiotics, and antiemetics.  F/u KUB on 10/27 demonstrated moderate stool in the colon and bowel rest.  Advancing diet and starting bowel regimen.    Assessment/Plan Abdominal pain:  due to possible early partial small bowel obstruction, enteritis, ileus, or diverticulitis.  Patient with worsening abdominal pain today and nausea.  -KUB negative for obstruction.  -Change protonix to IV.  - Continue IVF  - Continue antiemetics - Received  ciprofloxacin/flagyl day 7. -GI consulted. Appreciate recommendation.  -GI pathogen negative.  -CT negative for crohn.  -feeling better, tolerating diet.   UTI -klebsiella, received 7 days cipro.   Cough: Started  Guaifenesin. Persist, worse today. Repeat x ray. Prn tessalon pearl.   GERD, stable, continue protonix IV  Overactive bladder, hold myrbetriq  DVT, continue lovenox until clearly tolerating diet  DM type 2 with somewhat  low CBG despite dextrose fluids -  Hold metformin -  Change SSI to meal coverage   HTN, blood pressure low normal.  Continue to hold ARB  Insomnia, stable, on  remeron   RLS, stable, on requip  Severe malnutrition  -  Appreciate nutrition recommendations -  Start glucerna QID  Diet:  CLD  Access:  PIV IVF:  yes Proph:  lovenox  Code Status: full Family Communication: patient alone Disposition Plan: to be determine. PT consult. Home in 1 or 2 days.    Consultants:  None  Procedures:  CT abd/pelvis  CXR  Antibiotics:  cipro 10/26 >>    Flagyl 10/26 >>  HPI/Subjective: Feeling better today, pain better, tolerating diet. \\ Cough persist.   Objective: Filed Vitals:   10/07/14 1400 10/07/14 2156 10/08/14 0500 10/08/14 0658  BP: 125/52 122/56  110/68  Pulse: 59 60  60  Temp: 98.7 F (37.1 C) 98.8 F (37.1 C)  98 F (36.7 C)  TempSrc: Oral Oral  Oral  Resp: 16 18  18   Height:      Weight:   48.263 kg (106 lb 6.4 oz)   SpO2: 95% 96%  98%    Intake/Output Summary (Last 24 hours) at 10/08/14 1301 Last data filed at 10/08/14 0600  Gross per 24 hour  Intake   1200 ml  Output      0 ml  Net   1200 ml   Filed Weights   10/02/14 1700 10/08/14 0500  Weight: 47.809 kg (105 lb 6.4 oz) 48.263 kg (106 lb 6.4 oz)    Exam:   General:  Frail white female, No acute distress  HEENT:  NCAT, MMM  Cardiovascular:  RRR, nl S1, S2 no mrg.   Respiratory:  CTAB, no increased WOB  Abdomen:   NABS, soft, ND, NT  MSK:   Normal tone and bulk, no LEE  Neuro:  Grossly intact  Data Reviewed: Basic Metabolic Panel:  Recent Labs Lab 10/04/14 0403 10/05/14 0514 10/06/14 0514 10/07/14 0430 10/08/14 0515  NA 142 140 141 135* 143  K 3.9 3.9 3.9 4.2 4.0  CL 107 105 105 102 110  CO2 23 26 26 24 23   GLUCOSE 128* 131* 125* 98 133*  BUN 16 11 10 16 12   CREATININE 0.96 1.01 1.01 1.13* 0.98  CALCIUM 8.1* 8.3* 8.3* 8.0* 7.9*   Liver Function Tests:  Recent  Labs Lab 10/01/14 1744 10/02/14 0509  AST 28 24  ALT 18 13  ALKPHOS 92 78  BILITOT 0.3 0.3  PROT 6.7 5.6*  ALBUMIN 3.5 2.9*    Recent Labs Lab 10/01/14 1744 10/04/14 0456  LIPASE 22 17   No results for input(s): AMMONIA in the last 168 hours. CBC:  Recent Labs Lab 10/01/14 1744  10/04/14 0403 10/05/14 0514 10/06/14 0514 10/07/14 0430 10/08/14 0515  WBC 5.7  < > 4.8 4.1 4.0 5.4 5.6  NEUTROABS 4.6  --   --   --   --   --   --   HGB 11.0*  < > 9.9* 9.9* 10.2* 9.2* 10.1*  HCT 33.9*  < > 30.0* 30.2* 31.2* 28.0* 30.9*  MCV 91.9  < > 89.8 91.2 89.7 89.7 89.8  PLT 217  < > 179 156 184 160 184  < > = values in this interval not displayed. Cardiac Enzymes: No results for input(s): CKTOTAL, CKMB, CKMBINDEX, TROPONINI in the last 168 hours. BNP (last 3 results) No results for input(s): PROBNP in the last 8760 hours. CBG:  Recent Labs Lab 10/07/14 0814 10/07/14 1206 10/07/14 1639 10/07/14 2118 10/08/14 0822  GLUCAP 106* 180* 90 152* 126*    Recent Results (from the past 240 hour(s))  Urine culture     Status: None   Collection Time: 10/01/14  6:39 PM  Result Value Ref Range Status   Specimen Description URINE, CLEAN CATCH  Final   Special Requests Normal  Final   Culture  Setup Time   Final    10/02/2014 05:57 Performed at [facility]   Final    >=100,000 COLONIES/ML Performed at [place] Performed at [facility]   Report Status 10/04/2014 FINAL  Final   Organism ID, Bacteria KLEBSIELLA PNEUMONIAE  Final      Susceptibility   Klebsiella pneumoniae - MIC*    AMPICILLIN >=32 RESISTANT Resistant     CEFAZOLIN <=4 SENSITIVE Sensitive     CEFTRIAXONE <=1 SENSITIVE Sensitive     CIPROFLOXACIN <=0.25 SENSITIVE Sensitive     GENTAMICIN <=1 SENSITIVE Sensitive     LEVOFLOXACIN <=0.12 SENSITIVE Sensitive     NITROFURANTOIN <=16 SENSITIVE Sensitive     TOBRAMYCIN <=1 SENSITIVE  Sensitive     TRIMETH/SULFA <=20 SENSITIVE Sensitive     PIP/TAZO <=4 SENSITIVE Sensitive     * KLEBSIELLA PNEUMONIAE     Studies: No results found.  Scheduled Meds: . enoxaparin (LOVENOX) injection  1 mg/kg Subcutaneous Q24H  . feeding supplement (GLUCERNA SHAKE)  237 mL Oral TID PC & HS  . guaiFENesin  600 mg Oral BID  . insulin aspart  0-9 Units Subcutaneous TID  WC  . levothyroxine  25 mcg Oral QAC breakfast  . mirtazapine  7.5 mg Oral QHS  . ondansetron (ZOFRAN) IV  4 mg Intravenous 3 times per day   Or  . ondansetron  4 mg Oral 3 times per day  . pantoprazole (PROTONIX) IV  40 mg Intravenous Q12H  . rOPINIRole  5 mg Oral QHS   Continuous Infusions:    Principal Problem:   Abdominal pain, epigastric Active Problems:   RESTLESS LEG SYNDROME   Diverticulosis of large intestine   DVT of leg (deep venous thrombosis)   Hypothyroidism   Hx pulmonary embolism   Enteritis   Protein-calorie malnutrition, severe   Heme + stool   Crohn's ileocolitis   Hiatal hernia- moderate to large    Time spent: 30 min    [name], [facility] Pager [phone]. If 7PM-7AM, please contact night-coverage at [email or web address], password [facility] 10/08/2014, 1:01 PM  LOS: 7 days

## 2014-10-08 NOTE — Plan of Care (Signed)
Problem: Phase III Progression Outcomes Goal: IV/normal saline lock discontinued Outcome: Completed/Met Date Met:  10/08/14

## 2014-10-09 LAB — BASIC METABOLIC PANEL
Anion gap: 11 (ref 5–15)
BUN: 16 mg/dL (ref 6–23)
CALCIUM: 8.5 mg/dL (ref 8.4–10.5)
CO2: 24 mEq/L (ref 19–32)
Chloride: 106 mEq/L (ref 96–112)
Creatinine, Ser: 0.94 mg/dL (ref 0.50–1.10)
GFR calc Af Amer: 59 mL/min — ABNORMAL LOW (ref >90)
GFR, EST NON AFRICAN AMERICAN: 51 mL/min — AB (ref >90)
GLUCOSE: 134 mg/dL — AB (ref 70–99)
Potassium: 4.2 mEq/L (ref 3.7–5.3)
Sodium: 141 mEq/L (ref 137–147)

## 2014-10-09 LAB — CBC
HCT: 30.5 % — ABNORMAL LOW (ref 36.0–46.0)
Hemoglobin: 9.9 g/dL — ABNORMAL LOW (ref 12.0–15.0)
MCH: 29 pg (ref 26.0–34.0)
MCHC: 32.5 g/dL (ref 30.0–36.0)
MCV: 89.4 fL (ref 78.0–100.0)
Platelets: 209 10*3/uL (ref 150–400)
RBC: 3.41 MIL/uL — ABNORMAL LOW (ref 3.87–5.11)
RDW: 14.2 % (ref 11.5–15.5)
WBC: 6.9 10*3/uL (ref 4.0–10.5)

## 2014-10-09 LAB — GLUCOSE, CAPILLARY
GLUCOSE-CAPILLARY: 117 mg/dL — AB (ref 70–99)
Glucose-Capillary: 160 mg/dL — ABNORMAL HIGH (ref 70–99)

## 2014-10-09 MED ORDER — GUAIFENESIN ER 600 MG PO TB12
600.0000 mg | ORAL_TABLET | Freq: Two times a day (BID) | ORAL | Status: DC
Start: 2014-10-09 — End: 2015-05-01

## 2014-10-09 MED ORDER — MUPIROCIN CALCIUM 2 % EX CREA
TOPICAL_CREAM | Freq: Every day | CUTANEOUS | Status: DC
  Administered 2014-10-09: 15:00:00 via TOPICAL
  Filled 2014-10-09: qty 15

## 2014-10-09 MED ORDER — MENTHOL 3 MG MT LOZG
1.0000 | LOZENGE | OROMUCOSAL | Status: DC | PRN
  Filled 2014-10-09: qty 9

## 2014-10-09 MED ORDER — GLUCERNA SHAKE PO LIQD: 237.0000 mL | Freq: Three times a day (TID) | ORAL | Status: DC

## 2014-10-09 MED ORDER — RIVAROXABAN 10 MG PO TABS: 10.0000 mg | ORAL_TABLET | Freq: Every day | ORAL | Status: DC

## 2014-10-09 MED ORDER — APIXABAN 2.5 MG PO TABS
2.5000 mg | ORAL_TABLET | Freq: Two times a day (BID) | ORAL | Status: DC
  Administered 2014-10-09: 2.5 mg via ORAL
  Filled 2014-10-09 (×2): qty 1

## 2014-10-09 MED ORDER — APIXABAN 5 MG PO TABS: 5.0000 mg | ORAL_TABLET | Freq: Two times a day (BID) | ORAL | Status: DC

## 2014-10-09 MED ORDER — APIXABAN 2.5 MG PO TABS: 2.5000 mg | ORAL_TABLET | Freq: Two times a day (BID) | ORAL | Status: DC

## 2014-10-09 MED ORDER — LEVOFLOXACIN 500 MG PO TABS: 500.0000 mg | ORAL_TABLET | Freq: Every day | ORAL | Status: DC

## 2014-10-09 MED ORDER — BENZONATATE 200 MG PO CAPS: 200.0000 mg | ORAL_CAPSULE | Freq: Two times a day (BID) | ORAL | Status: DC | PRN

## 2014-10-09 MED ORDER — APIXABAN 5 MG PO TABS
5.0000 mg | ORAL_TABLET | Freq: Two times a day (BID) | ORAL | Status: DC
  Filled 2014-10-09 (×2): qty 1

## 2014-10-09 NOTE — Progress Notes (Signed)
Physical Therapy Treatment Patient Details Name: JAID QUIRION MRN: [id number] DOB: [birth year] Today's Date: 10/09/2014    History of Present Illness 80 yo female admitted with SBO, abd pain. Hx of bil LE, DVT, HTN, DM, polymyalgia rheumatica,RLS, PE. Pt is home alone with a caregiver 3x/week    PT Comments    Patient was seen bedside and assisted to [facility].  Patient initially c/o weakness prior to gait but tolerated tx well; she was followed with recliner for safety.  Patient successfully met stair training goal this tx.  She requires increased time to perform stairs.    Follow Up Recommendations  Home health PT;Supervision - Intermittent     Equipment Recommendations  None recommended by PT    Recommendations for Other Services       Precautions / Restrictions Precautions Precautions: Fall Restrictions Weight Bearing Restrictions: No    Mobility  Bed Mobility Overal bed mobility: Needs Assistance Bed Mobility: Supine to Sit     Supine to sit: Min guard     General bed mobility comments: increased time required; HOB elevated and use of siderails  Transfers Overall transfer level: Needs assistance Equipment used: Rolling walker (2 wheeled) Transfers: Sit to/from Stand Sit to Stand: Min guard         General transfer comment: VCs for use of RW to A for safety  Ambulation/Gait Ambulation/Gait assistance: Min guard Ambulation Distance (Feet): 300 Feet Assistive device: Rolling walker (2 wheeled) Gait Pattern/deviations: Step-through pattern;Trunk flexed Gait velocity: WFL   General Gait Details: pt initially c/o weakness but tolerated gait well; pt was followed with recliner   Stairs Stairs: Yes Stairs assistance: Min guard Stair Management: One rail Right;One rail Left;Step to pattern Number of Stairs: 5 General stair comments: pt requires increased time to perform stairs; she used R rail to ascend and L rail to descend  Wheelchair Mobility     Modified Rankin (Stroke Patients Only)       Balance                                    Cognition Arousal/Alertness: Awake/alert Behavior During Therapy: WFL for tasks assessed/performed Overall Cognitive Status: Within Functional Limits for tasks assessed                      Exercises      General Comments        Pertinent Vitals/Pain Pain Assessment: No/denies pain    Home Living                      Prior Function            PT Goals (current goals can now be found in the care plan section) Progress towards PT goals: Progressing toward goals    Frequency  Min 3X/week    PT Plan Current plan remains appropriate    Co-evaluation             End of Session Equipment Utilized During Treatment: Gait belt Activity Tolerance: Patient tolerated treatment well Patient left: in chair;with call bell/phone within reach     Time: 2956-2130 PT Time Calculation (min): 27 min  Charges:                       G Codes:      Miller,Derrick, SPTA 10/09/2014, 10:34 AM  Reviewed above  Rica Koyanagi  PTA [facility]  Acute  Rehab Pager      [phone]

## 2014-10-09 NOTE — Discharge Summary (Signed)
Physician Discharge Summary  [name] QQV:[id number] DOB: 1919-08-31 DOA: 10/01/2014  PCP: Benito Mccreedy, MD  Admit date: 10/01/2014 Discharge date: 10/09/2014  Time spent: 35 minutes  Recommendations for Outpatient Follow-up:  1. Need repeat renal function , patient on eliquis.  2. Needs follow up resolution of cough and abdominal pain.   Discharge Diagnoses:    Abdominal pain, epigastric   Bronchitis.    Klebsiella UTI.    RESTLESS LEG SYNDROME   Diverticulosis of large intestine   DVT of leg (deep venous thrombosis)   Hypothyroidism   Hx pulmonary embolism   Enteritis   Protein-calorie malnutrition, severe   Heme + stool   Crohn's ileocolitis   Hiatal hernia- moderate to large   Discharge Condition: stable./   Diet recommendation: heart healthy  Filed Weights   10/02/14 1700 10/08/14 0500  Weight: 47.809 kg (105 lb 6.4 oz) 48.263 kg (106 lb 6.4 oz)    History of present illness:  80 year old with PMH significant for Crohn diseases, Diverticulosis, presents with abdominal pain, nausea, vomiting and diarrhea.   Hospital Course:  Abdominal pain:enteritis, ileus, or diverticulitis. component of hiatal hernia.  -KUB negative for obstruction.  -She was treated with IV fluids, Protonix, IV antibiotics.  - Received ciprofloxacin/flagyl day 7. -GI consulted. Appreciate recommendation.  -GI pathogen negative.  -CT negative for crohn.  -feeling better, tolerating diet.   UTI -klebsiella, received 7 days cipro.   Felon: Patient will be on antibiotics. Warm compress. Wound care will follow.   Cough: Started Guaifenesin. Persist, worse today. Repeat x ray. Prn tessalon pearl. Will be discharge on 4 more days of Levaquin to cover for infection.   GERD, stable, continue protonix IV  Overactive bladder, hold myrbetriq  DVT, transition from lovenox to eliquis. Will use eliquis due to renal function.   DM type 2 with somewhat low CBG despite dextrose  fluids - Hold metformin - Change SSI to meal coverage   Procedures:  none  Consultations:  GI.   Discharge Exam: Filed Vitals:   10/09/14 0600  BP: 151/55  Pulse: 64  Temp: 99.1 F (37.3 C)  Resp: 18    General: Alert in no distress.  Cardiovascular: S 1, S 2 RRR Respiratory: CTA  Discharge Instructions You were cared for by a hospitalist during your hospital stay. If you have any questions about your discharge medications or the care you received while you were in the hospital after you are discharged, you can call the unit and asked to speak with the hospitalist on call if the hospitalist that took care of you is not available. Once you are discharged, your primary care physician will handle any further medical issues. Please note that NO REFILLS for any discharge medications will be authorized once you are discharged, as it is imperative that you return to your primary care physician (or establish a relationship with a primary care physician if you do not have one) for your aftercare needs so that they can reassess your need for medications and monitor your lab values.  Discharge Instructions    Diet - low sodium heart healthy    Complete by:  As directed      Increase activity slowly    Complete by:  As directed           Current Discharge Medication List    START taking these medications   Details  apixaban (ELIQUIS) 2.5 MG TABS tablet Take 1 tablet (2.5 mg total) by mouth 2 (two)  times daily. Qty: 60 tablet, Refills: 0    benzonatate (TESSALON) 200 MG capsule Take 1 capsule (200 mg total) by mouth 2 (two) times daily as needed for cough. Qty: 20 capsule, Refills: 0    guaiFENesin (MUCINEX) 600 MG 12 hr tablet Take 1 tablet (600 mg total) by mouth 2 (two) times daily. Qty: 30 tablet, Refills: 0    levofloxacin (LEVAQUIN) 500 MG tablet Take 1 tablet (500 mg total) by mouth daily. Qty: 4 tablet, Refills: 0      CONTINUE these medications which have CHANGED    Details  feeding supplement, GLUCERNA SHAKE, (GLUCERNA SHAKE) LIQD Take 237 mLs by mouth 4 (four) times daily - after meals and at bedtime. Qty: 30 Can, Refills: 0      CONTINUE these medications which have NOT CHANGED   Details  acetaminophen (TYLENOL) 325 MG tablet Take 2 tablets (650 mg total) by mouth every 4 (four) hours as needed for mild pain, fever or headache (or Fever >/= 101). Qty: 30 tablet, Refills: 0    cyanocobalamin (,VITAMIN B-12,) 1000 MCG/ML injection Inject 1,000 mcg into the muscle every 30 (thirty) days.    diclofenac sodium (VOLTAREN) 1 % GEL Apply 2 g topically every morning.    levothyroxine (SYNTHROID, LEVOTHROID) 25 MCG tablet Take 1 tablet (25 mcg total) by mouth daily before breakfast. Qty: 30 tablet, Refills: 0    losartan (COZAAR) 50 MG tablet Take 50 mg by mouth daily.    metFORMIN (GLUCOPHAGE) 500 MG tablet Take 1 tablet (500 mg total) by mouth daily with breakfast. Qty: 30 tablet, Refills: 0    mirtazapine (REMERON) 7.5 MG tablet Take 7.5 mg by mouth at bedtime.    ondansetron (ZOFRAN) 4 MG tablet Take 1 tablet (4 mg total) by mouth every 6 (six) hours as needed for nausea. Qty: 20 tablet, Refills: 0    oxyCODONE (OXY IR/ROXICODONE) 5 MG immediate release tablet Take 2 tablets (10 mg total) by mouth every 6 (six) hours as needed for moderate pain. Qty: 30 tablet, Refills: 0    PRESCRIPTION MEDICATION Antibiotic shot - name unknown at Dr. Doristine Section -Bonsu    ropinirole (REQUIP) 5 MG tablet Take 5 mg by mouth at bedtime.    senna-docusate (SENOKOT-S) 8.6-50 MG per tablet Take 1-2 tablets by mouth daily as needed for mild constipation or moderate constipation.       STOP taking these medications     rivaroxaban (XARELTO) 10 MG TABS tablet        Allergies  Allergen Reactions  . Sulfa Antibiotics Nausea And Vomiting  . Penicillins Hives and Rash   Follow-up Information    Follow up with OSEI-BONSU,GEORGE, MD.   Specialty:  Internal  Medicine   Contact information:   [place] [address] [phone]        The results of significant diagnostics from this hospitalization (including imaging, microbiology, ancillary and laboratory) are listed below for reference.    Significant Diagnostic Studies: Ct Abdomen Pelvis Wo Contrast  10/01/2014   CLINICAL DATA:  Lower abdominal pain. Nausea and vomiting. Reduced appetite. Recent diagnosis bladder infection.  EXAM: CT ABDOMEN AND PELVIS WITHOUT CONTRAST  TECHNIQUE: Multidetector CT imaging of the abdomen and pelvis was performed following the standard protocol without IV contrast.  COMPARISON:  Multiple exams, including 12/24/2013  FINDINGS: Lower chest: Mild atelectasis in the left lower lobe and right middle lobe. Dense calcification of the mitral valve. Moderate-sized hiatal hernia.  Hepatobiliary: Stable nonspecific  hypodense 5 mm lesion in the dome of the right hepatic lobe, image 9 series 2 no change from 2009 cell highly likely to be benign. Mild gallbladder wall thickening.  Pancreas: Unremarkable for age.  Spleen: Unremarkable  Adrenals/Urinary Tract: Unremarkable  Stomach/Bowel: Mildly dilated small bowel loops in the left abdomen up to 3.5 cm, with scattered air-fluid levels. Distal small bowel loops are not dilated. Well-defined transition point not seen. Prominent sigmoid colon diverticulosis. Difficult to exclude diverticulitis given the low grade diffuse mesenteric edema. Appendix not well visualized.  Vascular/Lymphatic: Aortoiliac atherosclerotic vascular disease.  Reproductive: No significant abnormality identified.  Other: No supplemental non-categorized findings.  Musculoskeletal: Levoconvex lumbar scoliosis. Pedicle screws with apparent radiolucent posterolateral rods at L4-L5-S1. Lumbar vertebral hemangiomatosis. Anterior subluxation at L3-4 and solid fused anterior subluxation at L4-5.  IMPRESSION: 1. Mildly dilated proximal loops of small bowel,  technically nonspecific. I do not see an obvious lead point for obstruction. Differential diagnostic considerations include early small bowel obstruction or enteritis/proximal ileus. 2. Severe sigmoid colon diverticulosis. There is generalized ill definition of the mesentery making it difficult to confidently exclude mild diverticulitis. 3.  Aortoiliac atherosclerotic vascular disease. 4. Lumbar scoliosis. 5. Benign hypodense lesion in the dome of the right hepatic lobe, stable from 2009. 6. Dense calcification of the mitral valve. 7. Moderate-sized hiatal hernia.   Electronically Signed   By: Sherryl Barters M.D.   On: 10/01/2014 18:02   Dg Chest 1 View  10/01/2014   CLINICAL DATA:  Cough today and congestion.  EXAM: CHEST - 1 VIEW  COMPARISON:  04/05/2014, 04/03/2014.  FINDINGS: Multiple skin folds project over the chest bilaterally. Stable mild cardiomegaly with mild mitral anulus calcification and heavy atherosclerotic calcification of the thoracic aorta. Pulmonary vascularity is normal. The lungs appear clear. No visible pneumothorax or pleural effusion. There are several healing versus remote left-sided rib fractures including the fifth, sixth, and seventh ribs. No acute bony abnormality is identified.  IMPRESSION: No acute cardiopulmonary disease.   Electronically Signed   By: Curlene Dolphin M.D.   On: 10/01/2014 17:37   Dg Chest 2 View  10/08/2014   CLINICAL DATA:  Productive cough.  Diabetes.  EXAM: CHEST  2 VIEW  COMPARISON:  10/05/2014  FINDINGS: Large lung volumes. Tortuous thoracic aorta. Hiatal hernia. Thoracic spondylosis. Densely calcified mitral valve.  Nodular density projecting over the right lung base, stable, likely nipple shadow.  IMPRESSION: 1. Stable appearance of large lung volumes, hiatal hernia, mitral annular calcification, and tortuosity of the thoracic aorta. No acute findings.   Electronically Signed   By: Sherryl Barters M.D.   On: 10/08/2014 17:23   Dg Chest 2  View  10/05/2014   CLINICAL DATA:  Cough, hypertension  EXAM: CHEST  2 VIEW  COMPARISON:  10/01/2014  FINDINGS: Cardiomediastinal silhouette is stable. Hyperinflation again noted. Moderate size hiatal hernia. No acute infiltrate or pulmonary edema. Thoracic spine osteopenia. Mild degenerative changes thoracic spine.  IMPRESSION: No active disease.  Hyperinflation again noted.   Electronically Signed   By: Lahoma Crocker M.D.   On: 10/05/2014 12:03   Dg Abd 1 View  10/04/2014   CLINICAL DATA:  Abdominal pain.  EXAM: ABDOMEN - 1 VIEW  COMPARISON:  Radiograph dated 10/03/2014 and CT scan dated 10/01/2014  FINDINGS: Bowel gas pattern is normal. No visible free air or free fluid on this supine radiograph. No acute osseous abnormality. Previous fusions in the lower lumbar spine.  IMPRESSION: Benign appearing abdomen.   Electronically Signed  By: Rozetta Nunnery M.D.   On: 10/04/2014 12:06   Ct Abdomen Pelvis W Contrast  10/06/2014   CLINICAL DATA:  Epigastric and mid abdominal pain with nausea and diarrhea. Evaluate for Crohn disease.  EXAM: CT ABDOMEN AND PELVIS WITH CONTRAST  TECHNIQUE: Multidetector CT imaging of the abdomen and pelvis was performed using the standard protocol following bolus administration of intravenous contrast.  CONTRAST:  17m OMNIPAQUE IOHEXOL 300 MG/ML  SOLN  COMPARISON:  10/01/2014, 12/24/2013 and 01/06/2010.  FINDINGS: Lower chest: Lung bases show tiny bilateral effusions with mild compressive atelectasis in both lower lobes. Heart size within normal limits. No pericardial effusion. Moderate hiatal hernia.  Hepatobiliary: 5 mm low-attenuation lesion in the dome of the liver is too small to definitively characterize but stable and therefore likely a cyst or hemangioma. Very mild intrahepatic biliary duct dilatation, unchanged from 12/24/2013.  Pancreas: Low-attenuation lesion in the body the pancreas measures 1.2 x 1.5 cm, unchanged from 01/06/2010 and therefore considered benign.  Spleen:  Negative.  Adrenals/Urinary Tract: Adrenal glands and kidneys are otherwise unremarkable. Ureters are decompressed. Bladder is grossly unremarkable.  Stomach/Bowel: Moderate hiatal hernia. Pyloric channel thickening seen on portal venous phase imaging, resolved on nephrographic phase imaging. The majority of the small bowel is unremarkable. Distal/ terminal ileum is somewhat decompressed (series 3, image 71), somewhat limiting evaluation. No surrounding fat proliferation or inflammatory stranding. Colon is grossly unremarkable.  Vascular/Lymphatic: Atherosclerotic calcification of the arterial vasculature without abdominal aortic aneurysm. No pathologically enlarged lymph nodes.  Reproductive: Uterus and ovaries are visualized.  Other: No free fluid. Mesenteries and peritoneum are grossly unremarkable.  Musculoskeletal: No worrisome lytic or sclerotic lesions. Postoperative changes are seen in the lumbar spine. Grade 1 anterolisthesis of L3 on L4 with secondary degenerative disc disease.  IMPRESSION: 1. Distal/terminal ileum is somewhat decompressed, limiting evaluation. No surrounding inflammatory changes to indicate active Crohn disease. 2. Tiny bilateral effusions. 3. Moderate hiatal hernia.   Electronically Signed   By: MLorin PicketM.D.   On: 10/06/2014 12:53   Dg Abd Portable 1v  10/03/2014   CLINICAL DATA:  Abdominal pain and nausea  EXAM: PORTABLE ABDOMEN - 1 VIEW  COMPARISON:  CT abdomen and pelvis October 01, 2014  FINDINGS: There is contrast in the colon. There is moderate stool in the colon. The overall bowel gas pattern is unremarkable. No obstruction or free air is seen on this supine examination. There is postoperative change in the lumbar spine. Pelvic calcifications appear consistent with phleboliths. There is calcification in the mitral annulus.  IMPRESSION: Bowel gas pattern unremarkable.  Moderate stool in colon.   Electronically Signed   By: WLowella GripM.D.   On: 10/03/2014 07:48     Microbiology: Recent Results (from the past 240 hour(s))  Urine culture     Status: None   Collection Time: 10/01/14  6:39 PM  Result Value Ref Range Status   Specimen Description URINE, CLEAN CATCH  Final   Special Requests Normal  Final   Culture  Setup Time   Final    10/02/2014 05:57 [facility]  Final    >=100,000 COLONIES/ML [facility]  Report Status 10/04/2014 FINAL  Final   Organism ID, Bacteria KLEBSIELLA PNEUMONIAE  Final      Susceptibility   Klebsiella pneumoniae - MIC*    AMPICILLIN >=32  RESISTANT Resistant     CEFAZOLIN <=4 SENSITIVE Sensitive     CEFTRIAXONE <=1 SENSITIVE Sensitive     CIPROFLOXACIN <=0.25 SENSITIVE Sensitive     GENTAMICIN <=1 SENSITIVE Sensitive     LEVOFLOXACIN <=0.12 SENSITIVE Sensitive     NITROFURANTOIN <=16 SENSITIVE Sensitive     TOBRAMYCIN <=1 SENSITIVE Sensitive     TRIMETH/SULFA <=20 SENSITIVE Sensitive     PIP/TAZO <=4 SENSITIVE Sensitive     * KLEBSIELLA PNEUMONIAE     Labs: Basic Metabolic Panel:  Recent Labs Lab 10/05/14 0514 10/06/14 0514 10/07/14 0430 10/08/14 0515 10/09/14 0428  NA 140 141 135* 143 141  K 3.9 3.9 4.2 4.0 4.2  CL 105 105 102 110 106  CO2 26 26 24 23 24   GLUCOSE 131* 125* 98 133* 134*  BUN 11 10 16 12 16   CREATININE 1.01 1.01 1.13* 0.98 0.94  CALCIUM 8.3* 8.3* 8.0* 7.9* 8.5   Liver Function Tests: No results for input(s): AST, ALT, ALKPHOS, BILITOT, PROT, ALBUMIN in the last 168 hours.  Recent Labs Lab 10/04/14 0456  LIPASE 17   No results for input(s): AMMONIA in the last 168 hours. CBC:  Recent Labs Lab 10/05/14 0514 10/06/14 0514 10/07/14 0430 10/08/14 0515 10/09/14 0428  WBC 4.1 4.0 5.4 5.6 6.9  HGB 9.9* 10.2* 9.2* 10.1* 9.9*  HCT 30.2* 31.2* 28.0* 30.9* 30.5*  MCV 91.2 89.7 89.7 89.8 89.4  PLT 156 184 160 184 209   Cardiac  Enzymes: No results for input(s): CKTOTAL, CKMB, CKMBINDEX, TROPONINI in the last 168 hours. BNP: BNP (last 3 results) No results for input(s): PROBNP in the last 8760 hours. CBG:  Recent Labs Lab 10/07/14 2118 10/08/14 0822 10/08/14 1238 10/08/14 1655 10/08/14 2140  GLUCAP 152* 126* 160* 116* 164*       Signed:  Jorge Amparo A  [facility] 10/09/2014, 12:25 PM

## 2014-10-09 NOTE — Consult Note (Signed)
WOC wound consult note Reason for Consult: Pus, drainage to right first finger, onset 1 day ago.  Tender and erythematous.  Wound type: INfectious  Measurement: 1 cm x 0.2 cm white, indurated skin with erythema surrounding.   Wound bed: Intact Drainage (amount, consistency, odor) scant amount of pus.  Periwound: Erythema Dressing procedure/placement/frequency: Cleanse left first finger with NS and pat gently dry.  Apply Bactroban to indurated and erythematous are.  Cover with 2x2 gauze and secure with tape.  Change daily.  Will not follow at this time.  Please re-consult if needed.  Domenic Moras RN BSN [place] Pager [phone]

## 2014-10-09 NOTE — Progress Notes (Signed)
OT Cancellation Note  Patient Details Name: GRETHA ANDRIOLA MRN: [id number] DOB: 01-Jan-1919   Cancelled Treatment:    Reason Eval/Treat Not Completed: Fatigue/lethargy limiting ability to participate Attempted to see pt X 2 for OT. First attempt pt stated she had just finished working with PT shortly before OT arrived and requested to rest in chair. Returned later in am and pt back in bed and again requesting to rest. Will reattempt as schedule allows today.  Jules Schick  [id number] 10/09/2014, 11:43 AM

## 2014-10-09 NOTE — Plan of Care (Signed)
Problem: Phase III Progression Outcomes Goal: Activity at appropriate level-compared to baseline (UP IN CHAIR FOR HEMODIALYSIS)  Outcome: Completed/Met Date Met:  10/09/14 Goal: Discharge plan remains appropriate-arrangements made Outcome: Completed/Met Date Met:  10/09/14

## 2014-10-10 DIAGNOSIS — I82503 Chronic embolism and thrombosis of unspecified deep veins of lower extremity, bilateral: Secondary | ICD-10-CM | POA: Diagnosis not present

## 2014-10-10 DIAGNOSIS — R32 Unspecified urinary incontinence: Secondary | ICD-10-CM | POA: Diagnosis not present

## 2014-10-10 DIAGNOSIS — Z7901 Long term (current) use of anticoagulants: Secondary | ICD-10-CM | POA: Diagnosis not present

## 2014-10-10 DIAGNOSIS — S6991XD Unspecified injury of right wrist, hand and finger(s), subsequent encounter: Secondary | ICD-10-CM | POA: Diagnosis not present

## 2014-10-10 DIAGNOSIS — E039 Hypothyroidism, unspecified: Secondary | ICD-10-CM | POA: Diagnosis not present

## 2014-10-10 DIAGNOSIS — E119 Type 2 diabetes mellitus without complications: Secondary | ICD-10-CM | POA: Diagnosis not present

## 2014-10-10 DIAGNOSIS — G2581 Restless legs syndrome: Secondary | ICD-10-CM | POA: Diagnosis not present

## 2014-10-10 DIAGNOSIS — K5730 Diverticulosis of large intestine without perforation or abscess without bleeding: Secondary | ICD-10-CM | POA: Diagnosis not present

## 2014-10-12 DIAGNOSIS — G2581 Restless legs syndrome: Secondary | ICD-10-CM | POA: Diagnosis not present

## 2014-10-12 DIAGNOSIS — K5730 Diverticulosis of large intestine without perforation or abscess without bleeding: Secondary | ICD-10-CM | POA: Diagnosis not present

## 2014-10-12 DIAGNOSIS — E119 Type 2 diabetes mellitus without complications: Secondary | ICD-10-CM | POA: Diagnosis not present

## 2014-10-12 DIAGNOSIS — I82503 Chronic embolism and thrombosis of unspecified deep veins of lower extremity, bilateral: Secondary | ICD-10-CM | POA: Diagnosis not present

## 2014-10-12 DIAGNOSIS — R32 Unspecified urinary incontinence: Secondary | ICD-10-CM | POA: Diagnosis not present

## 2014-10-12 DIAGNOSIS — S6991XD Unspecified injury of right wrist, hand and finger(s), subsequent encounter: Secondary | ICD-10-CM | POA: Diagnosis not present

## 2014-10-16 DIAGNOSIS — F329 Major depressive disorder, single episode, unspecified: Secondary | ICD-10-CM | POA: Diagnosis not present

## 2014-10-16 DIAGNOSIS — E1140 Type 2 diabetes mellitus with diabetic neuropathy, unspecified: Secondary | ICD-10-CM | POA: Diagnosis not present

## 2014-10-16 DIAGNOSIS — Z86718 Personal history of other venous thrombosis and embolism: Secondary | ICD-10-CM | POA: Diagnosis not present

## 2014-10-16 DIAGNOSIS — E538 Deficiency of other specified B group vitamins: Secondary | ICD-10-CM | POA: Diagnosis not present

## 2014-10-16 DIAGNOSIS — I10 Essential (primary) hypertension: Secondary | ICD-10-CM | POA: Diagnosis not present

## 2014-10-16 DIAGNOSIS — G8929 Other chronic pain: Secondary | ICD-10-CM | POA: Diagnosis not present

## 2014-10-16 DIAGNOSIS — G2581 Restless legs syndrome: Secondary | ICD-10-CM | POA: Diagnosis not present

## 2014-10-16 DIAGNOSIS — R197 Diarrhea, unspecified: Secondary | ICD-10-CM | POA: Diagnosis not present

## 2014-10-16 DIAGNOSIS — K219 Gastro-esophageal reflux disease without esophagitis: Secondary | ICD-10-CM | POA: Diagnosis not present

## 2014-10-17 DIAGNOSIS — K5730 Diverticulosis of large intestine without perforation or abscess without bleeding: Secondary | ICD-10-CM | POA: Diagnosis not present

## 2014-10-17 DIAGNOSIS — I82503 Chronic embolism and thrombosis of unspecified deep veins of lower extremity, bilateral: Secondary | ICD-10-CM | POA: Diagnosis not present

## 2014-10-17 DIAGNOSIS — G2581 Restless legs syndrome: Secondary | ICD-10-CM | POA: Diagnosis not present

## 2014-10-17 DIAGNOSIS — E119 Type 2 diabetes mellitus without complications: Secondary | ICD-10-CM | POA: Diagnosis not present

## 2014-10-17 DIAGNOSIS — S6991XD Unspecified injury of right wrist, hand and finger(s), subsequent encounter: Secondary | ICD-10-CM | POA: Diagnosis not present

## 2014-10-17 DIAGNOSIS — R32 Unspecified urinary incontinence: Secondary | ICD-10-CM | POA: Diagnosis not present

## 2014-10-18 DIAGNOSIS — E119 Type 2 diabetes mellitus without complications: Secondary | ICD-10-CM | POA: Diagnosis not present

## 2014-10-18 DIAGNOSIS — I82503 Chronic embolism and thrombosis of unspecified deep veins of lower extremity, bilateral: Secondary | ICD-10-CM | POA: Diagnosis not present

## 2014-10-18 DIAGNOSIS — G2581 Restless legs syndrome: Secondary | ICD-10-CM | POA: Diagnosis not present

## 2014-10-18 DIAGNOSIS — K5730 Diverticulosis of large intestine without perforation or abscess without bleeding: Secondary | ICD-10-CM | POA: Diagnosis not present

## 2014-10-18 DIAGNOSIS — S6991XD Unspecified injury of right wrist, hand and finger(s), subsequent encounter: Secondary | ICD-10-CM | POA: Diagnosis not present

## 2014-10-18 DIAGNOSIS — R32 Unspecified urinary incontinence: Secondary | ICD-10-CM | POA: Diagnosis not present

## 2014-10-19 DIAGNOSIS — E119 Type 2 diabetes mellitus without complications: Secondary | ICD-10-CM | POA: Diagnosis not present

## 2014-10-19 DIAGNOSIS — K5730 Diverticulosis of large intestine without perforation or abscess without bleeding: Secondary | ICD-10-CM | POA: Diagnosis not present

## 2014-10-19 DIAGNOSIS — G2581 Restless legs syndrome: Secondary | ICD-10-CM | POA: Diagnosis not present

## 2014-10-19 DIAGNOSIS — I82503 Chronic embolism and thrombosis of unspecified deep veins of lower extremity, bilateral: Secondary | ICD-10-CM | POA: Diagnosis not present

## 2014-10-19 DIAGNOSIS — R32 Unspecified urinary incontinence: Secondary | ICD-10-CM | POA: Diagnosis not present

## 2014-10-19 DIAGNOSIS — S6991XD Unspecified injury of right wrist, hand and finger(s), subsequent encounter: Secondary | ICD-10-CM | POA: Diagnosis not present

## 2014-10-23 DIAGNOSIS — S6991XD Unspecified injury of right wrist, hand and finger(s), subsequent encounter: Secondary | ICD-10-CM | POA: Diagnosis not present

## 2014-10-23 DIAGNOSIS — R32 Unspecified urinary incontinence: Secondary | ICD-10-CM | POA: Diagnosis not present

## 2014-10-23 DIAGNOSIS — E119 Type 2 diabetes mellitus without complications: Secondary | ICD-10-CM | POA: Diagnosis not present

## 2014-10-23 DIAGNOSIS — I82503 Chronic embolism and thrombosis of unspecified deep veins of lower extremity, bilateral: Secondary | ICD-10-CM | POA: Diagnosis not present

## 2014-10-23 DIAGNOSIS — G2581 Restless legs syndrome: Secondary | ICD-10-CM | POA: Diagnosis not present

## 2014-10-23 DIAGNOSIS — K5730 Diverticulosis of large intestine without perforation or abscess without bleeding: Secondary | ICD-10-CM | POA: Diagnosis not present

## 2014-10-24 DIAGNOSIS — G2581 Restless legs syndrome: Secondary | ICD-10-CM | POA: Diagnosis not present

## 2014-10-24 DIAGNOSIS — K5730 Diverticulosis of large intestine without perforation or abscess without bleeding: Secondary | ICD-10-CM | POA: Diagnosis not present

## 2014-10-24 DIAGNOSIS — R32 Unspecified urinary incontinence: Secondary | ICD-10-CM | POA: Diagnosis not present

## 2014-10-24 DIAGNOSIS — I82503 Chronic embolism and thrombosis of unspecified deep veins of lower extremity, bilateral: Secondary | ICD-10-CM | POA: Diagnosis not present

## 2014-10-24 DIAGNOSIS — E119 Type 2 diabetes mellitus without complications: Secondary | ICD-10-CM | POA: Diagnosis not present

## 2014-10-24 DIAGNOSIS — S6991XD Unspecified injury of right wrist, hand and finger(s), subsequent encounter: Secondary | ICD-10-CM | POA: Diagnosis not present

## 2014-10-25 DIAGNOSIS — G2581 Restless legs syndrome: Secondary | ICD-10-CM | POA: Diagnosis not present

## 2014-10-25 DIAGNOSIS — I82503 Chronic embolism and thrombosis of unspecified deep veins of lower extremity, bilateral: Secondary | ICD-10-CM | POA: Diagnosis not present

## 2014-10-25 DIAGNOSIS — E119 Type 2 diabetes mellitus without complications: Secondary | ICD-10-CM | POA: Diagnosis not present

## 2014-10-25 DIAGNOSIS — K5730 Diverticulosis of large intestine without perforation or abscess without bleeding: Secondary | ICD-10-CM | POA: Diagnosis not present

## 2014-10-25 DIAGNOSIS — R32 Unspecified urinary incontinence: Secondary | ICD-10-CM | POA: Diagnosis not present

## 2014-10-25 DIAGNOSIS — S6991XD Unspecified injury of right wrist, hand and finger(s), subsequent encounter: Secondary | ICD-10-CM | POA: Diagnosis not present

## 2014-10-30 DIAGNOSIS — I10 Essential (primary) hypertension: Secondary | ICD-10-CM | POA: Diagnosis not present

## 2014-10-30 DIAGNOSIS — E1140 Type 2 diabetes mellitus with diabetic neuropathy, unspecified: Secondary | ICD-10-CM | POA: Diagnosis not present

## 2014-10-30 DIAGNOSIS — Z86718 Personal history of other venous thrombosis and embolism: Secondary | ICD-10-CM | POA: Diagnosis not present

## 2014-10-30 DIAGNOSIS — F329 Major depressive disorder, single episode, unspecified: Secondary | ICD-10-CM | POA: Diagnosis not present

## 2014-10-30 DIAGNOSIS — E538 Deficiency of other specified B group vitamins: Secondary | ICD-10-CM | POA: Diagnosis not present

## 2014-10-30 DIAGNOSIS — K219 Gastro-esophageal reflux disease without esophagitis: Secondary | ICD-10-CM | POA: Diagnosis not present

## 2014-10-30 DIAGNOSIS — R05 Cough: Secondary | ICD-10-CM | POA: Diagnosis not present

## 2014-10-30 DIAGNOSIS — G2581 Restless legs syndrome: Secondary | ICD-10-CM | POA: Diagnosis not present

## 2014-10-30 DIAGNOSIS — G8929 Other chronic pain: Secondary | ICD-10-CM | POA: Diagnosis not present

## 2014-10-31 DIAGNOSIS — K5730 Diverticulosis of large intestine without perforation or abscess without bleeding: Secondary | ICD-10-CM | POA: Diagnosis not present

## 2014-10-31 DIAGNOSIS — R32 Unspecified urinary incontinence: Secondary | ICD-10-CM | POA: Diagnosis not present

## 2014-10-31 DIAGNOSIS — E119 Type 2 diabetes mellitus without complications: Secondary | ICD-10-CM | POA: Diagnosis not present

## 2014-10-31 DIAGNOSIS — S6991XD Unspecified injury of right wrist, hand and finger(s), subsequent encounter: Secondary | ICD-10-CM | POA: Diagnosis not present

## 2014-10-31 DIAGNOSIS — G2581 Restless legs syndrome: Secondary | ICD-10-CM | POA: Diagnosis not present

## 2014-10-31 DIAGNOSIS — I82503 Chronic embolism and thrombosis of unspecified deep veins of lower extremity, bilateral: Secondary | ICD-10-CM | POA: Diagnosis not present

## 2014-11-01 DIAGNOSIS — K5730 Diverticulosis of large intestine without perforation or abscess without bleeding: Secondary | ICD-10-CM | POA: Diagnosis not present

## 2014-11-01 DIAGNOSIS — S6991XD Unspecified injury of right wrist, hand and finger(s), subsequent encounter: Secondary | ICD-10-CM | POA: Diagnosis not present

## 2014-11-01 DIAGNOSIS — R32 Unspecified urinary incontinence: Secondary | ICD-10-CM | POA: Diagnosis not present

## 2014-11-01 DIAGNOSIS — E119 Type 2 diabetes mellitus without complications: Secondary | ICD-10-CM | POA: Diagnosis not present

## 2014-11-01 DIAGNOSIS — G2581 Restless legs syndrome: Secondary | ICD-10-CM | POA: Diagnosis not present

## 2014-11-01 DIAGNOSIS — I82503 Chronic embolism and thrombosis of unspecified deep veins of lower extremity, bilateral: Secondary | ICD-10-CM | POA: Diagnosis not present

## 2014-11-03 DIAGNOSIS — I82503 Chronic embolism and thrombosis of unspecified deep veins of lower extremity, bilateral: Secondary | ICD-10-CM | POA: Diagnosis not present

## 2014-11-03 DIAGNOSIS — S6991XD Unspecified injury of right wrist, hand and finger(s), subsequent encounter: Secondary | ICD-10-CM | POA: Diagnosis not present

## 2014-11-03 DIAGNOSIS — R32 Unspecified urinary incontinence: Secondary | ICD-10-CM | POA: Diagnosis not present

## 2014-11-03 DIAGNOSIS — E119 Type 2 diabetes mellitus without complications: Secondary | ICD-10-CM | POA: Diagnosis not present

## 2014-11-03 DIAGNOSIS — K5730 Diverticulosis of large intestine without perforation or abscess without bleeding: Secondary | ICD-10-CM | POA: Diagnosis not present

## 2014-11-03 DIAGNOSIS — G2581 Restless legs syndrome: Secondary | ICD-10-CM | POA: Diagnosis not present

## 2014-11-06 DIAGNOSIS — S6991XD Unspecified injury of right wrist, hand and finger(s), subsequent encounter: Secondary | ICD-10-CM | POA: Diagnosis not present

## 2014-11-06 DIAGNOSIS — K5730 Diverticulosis of large intestine without perforation or abscess without bleeding: Secondary | ICD-10-CM | POA: Diagnosis not present

## 2014-11-06 DIAGNOSIS — G2581 Restless legs syndrome: Secondary | ICD-10-CM | POA: Diagnosis not present

## 2014-11-06 DIAGNOSIS — I82503 Chronic embolism and thrombosis of unspecified deep veins of lower extremity, bilateral: Secondary | ICD-10-CM | POA: Diagnosis not present

## 2014-11-06 DIAGNOSIS — E119 Type 2 diabetes mellitus without complications: Secondary | ICD-10-CM | POA: Diagnosis not present

## 2014-11-06 DIAGNOSIS — R32 Unspecified urinary incontinence: Secondary | ICD-10-CM | POA: Diagnosis not present

## 2014-11-07 DIAGNOSIS — E119 Type 2 diabetes mellitus without complications: Secondary | ICD-10-CM | POA: Diagnosis not present

## 2014-11-07 DIAGNOSIS — I82503 Chronic embolism and thrombosis of unspecified deep veins of lower extremity, bilateral: Secondary | ICD-10-CM | POA: Diagnosis not present

## 2014-11-07 DIAGNOSIS — G2581 Restless legs syndrome: Secondary | ICD-10-CM | POA: Diagnosis not present

## 2014-11-07 DIAGNOSIS — K5730 Diverticulosis of large intestine without perforation or abscess without bleeding: Secondary | ICD-10-CM | POA: Diagnosis not present

## 2014-11-07 DIAGNOSIS — S6991XD Unspecified injury of right wrist, hand and finger(s), subsequent encounter: Secondary | ICD-10-CM | POA: Diagnosis not present

## 2014-11-07 DIAGNOSIS — R32 Unspecified urinary incontinence: Secondary | ICD-10-CM | POA: Diagnosis not present

## 2014-11-08 DIAGNOSIS — K5730 Diverticulosis of large intestine without perforation or abscess without bleeding: Secondary | ICD-10-CM | POA: Diagnosis not present

## 2014-11-08 DIAGNOSIS — R32 Unspecified urinary incontinence: Secondary | ICD-10-CM | POA: Diagnosis not present

## 2014-11-08 DIAGNOSIS — S6991XD Unspecified injury of right wrist, hand and finger(s), subsequent encounter: Secondary | ICD-10-CM | POA: Diagnosis not present

## 2014-11-08 DIAGNOSIS — I82503 Chronic embolism and thrombosis of unspecified deep veins of lower extremity, bilateral: Secondary | ICD-10-CM | POA: Diagnosis not present

## 2014-11-08 DIAGNOSIS — G2581 Restless legs syndrome: Secondary | ICD-10-CM | POA: Diagnosis not present

## 2014-11-08 DIAGNOSIS — E119 Type 2 diabetes mellitus without complications: Secondary | ICD-10-CM | POA: Diagnosis not present

## 2014-11-15 DIAGNOSIS — K5730 Diverticulosis of large intestine without perforation or abscess without bleeding: Secondary | ICD-10-CM | POA: Diagnosis not present

## 2014-11-15 DIAGNOSIS — I82503 Chronic embolism and thrombosis of unspecified deep veins of lower extremity, bilateral: Secondary | ICD-10-CM | POA: Diagnosis not present

## 2014-11-15 DIAGNOSIS — E119 Type 2 diabetes mellitus without complications: Secondary | ICD-10-CM | POA: Diagnosis not present

## 2014-11-15 DIAGNOSIS — G2581 Restless legs syndrome: Secondary | ICD-10-CM | POA: Diagnosis not present

## 2014-11-15 DIAGNOSIS — R32 Unspecified urinary incontinence: Secondary | ICD-10-CM | POA: Diagnosis not present

## 2014-11-15 DIAGNOSIS — S6991XD Unspecified injury of right wrist, hand and finger(s), subsequent encounter: Secondary | ICD-10-CM | POA: Diagnosis not present

## 2014-11-16 DIAGNOSIS — I82503 Chronic embolism and thrombosis of unspecified deep veins of lower extremity, bilateral: Secondary | ICD-10-CM | POA: Diagnosis not present

## 2014-11-16 DIAGNOSIS — K5730 Diverticulosis of large intestine without perforation or abscess without bleeding: Secondary | ICD-10-CM | POA: Diagnosis not present

## 2014-11-16 DIAGNOSIS — G2581 Restless legs syndrome: Secondary | ICD-10-CM | POA: Diagnosis not present

## 2014-11-16 DIAGNOSIS — E119 Type 2 diabetes mellitus without complications: Secondary | ICD-10-CM | POA: Diagnosis not present

## 2014-11-16 DIAGNOSIS — R32 Unspecified urinary incontinence: Secondary | ICD-10-CM | POA: Diagnosis not present

## 2014-11-16 DIAGNOSIS — S6991XD Unspecified injury of right wrist, hand and finger(s), subsequent encounter: Secondary | ICD-10-CM | POA: Diagnosis not present

## 2014-11-17 DIAGNOSIS — R32 Unspecified urinary incontinence: Secondary | ICD-10-CM | POA: Diagnosis not present

## 2014-11-17 DIAGNOSIS — S6991XD Unspecified injury of right wrist, hand and finger(s), subsequent encounter: Secondary | ICD-10-CM | POA: Diagnosis not present

## 2014-11-17 DIAGNOSIS — K5730 Diverticulosis of large intestine without perforation or abscess without bleeding: Secondary | ICD-10-CM | POA: Diagnosis not present

## 2014-11-17 DIAGNOSIS — I82503 Chronic embolism and thrombosis of unspecified deep veins of lower extremity, bilateral: Secondary | ICD-10-CM | POA: Diagnosis not present

## 2014-11-17 DIAGNOSIS — E119 Type 2 diabetes mellitus without complications: Secondary | ICD-10-CM | POA: Diagnosis not present

## 2014-11-17 DIAGNOSIS — G2581 Restless legs syndrome: Secondary | ICD-10-CM | POA: Diagnosis not present

## 2014-11-21 DIAGNOSIS — G2581 Restless legs syndrome: Secondary | ICD-10-CM | POA: Diagnosis not present

## 2014-11-21 DIAGNOSIS — K5730 Diverticulosis of large intestine without perforation or abscess without bleeding: Secondary | ICD-10-CM | POA: Diagnosis not present

## 2014-11-21 DIAGNOSIS — I82503 Chronic embolism and thrombosis of unspecified deep veins of lower extremity, bilateral: Secondary | ICD-10-CM | POA: Diagnosis not present

## 2014-11-21 DIAGNOSIS — R32 Unspecified urinary incontinence: Secondary | ICD-10-CM | POA: Diagnosis not present

## 2014-11-21 DIAGNOSIS — S6991XD Unspecified injury of right wrist, hand and finger(s), subsequent encounter: Secondary | ICD-10-CM | POA: Diagnosis not present

## 2014-11-21 DIAGNOSIS — E119 Type 2 diabetes mellitus without complications: Secondary | ICD-10-CM | POA: Diagnosis not present

## 2014-11-24 DIAGNOSIS — E119 Type 2 diabetes mellitus without complications: Secondary | ICD-10-CM | POA: Diagnosis not present

## 2014-11-24 DIAGNOSIS — K5730 Diverticulosis of large intestine without perforation or abscess without bleeding: Secondary | ICD-10-CM | POA: Diagnosis not present

## 2014-11-24 DIAGNOSIS — G2581 Restless legs syndrome: Secondary | ICD-10-CM | POA: Diagnosis not present

## 2014-11-24 DIAGNOSIS — I82503 Chronic embolism and thrombosis of unspecified deep veins of lower extremity, bilateral: Secondary | ICD-10-CM | POA: Diagnosis not present

## 2014-11-24 DIAGNOSIS — S6991XD Unspecified injury of right wrist, hand and finger(s), subsequent encounter: Secondary | ICD-10-CM | POA: Diagnosis not present

## 2014-11-24 DIAGNOSIS — R32 Unspecified urinary incontinence: Secondary | ICD-10-CM | POA: Diagnosis not present

## 2014-11-28 DIAGNOSIS — I82503 Chronic embolism and thrombosis of unspecified deep veins of lower extremity, bilateral: Secondary | ICD-10-CM | POA: Diagnosis not present

## 2014-11-28 DIAGNOSIS — S6991XD Unspecified injury of right wrist, hand and finger(s), subsequent encounter: Secondary | ICD-10-CM | POA: Diagnosis not present

## 2014-11-28 DIAGNOSIS — E119 Type 2 diabetes mellitus without complications: Secondary | ICD-10-CM | POA: Diagnosis not present

## 2014-11-28 DIAGNOSIS — R32 Unspecified urinary incontinence: Secondary | ICD-10-CM | POA: Diagnosis not present

## 2014-11-28 DIAGNOSIS — K5730 Diverticulosis of large intestine without perforation or abscess without bleeding: Secondary | ICD-10-CM | POA: Diagnosis not present

## 2014-11-28 DIAGNOSIS — G2581 Restless legs syndrome: Secondary | ICD-10-CM | POA: Diagnosis not present

## 2014-11-30 DIAGNOSIS — R32 Unspecified urinary incontinence: Secondary | ICD-10-CM | POA: Diagnosis not present

## 2014-11-30 DIAGNOSIS — I82503 Chronic embolism and thrombosis of unspecified deep veins of lower extremity, bilateral: Secondary | ICD-10-CM | POA: Diagnosis not present

## 2014-11-30 DIAGNOSIS — K5730 Diverticulosis of large intestine without perforation or abscess without bleeding: Secondary | ICD-10-CM | POA: Diagnosis not present

## 2014-11-30 DIAGNOSIS — E119 Type 2 diabetes mellitus without complications: Secondary | ICD-10-CM | POA: Diagnosis not present

## 2014-11-30 DIAGNOSIS — G2581 Restless legs syndrome: Secondary | ICD-10-CM | POA: Diagnosis not present

## 2014-11-30 DIAGNOSIS — S6991XD Unspecified injury of right wrist, hand and finger(s), subsequent encounter: Secondary | ICD-10-CM | POA: Diagnosis not present

## 2014-12-04 DIAGNOSIS — R32 Unspecified urinary incontinence: Secondary | ICD-10-CM | POA: Diagnosis not present

## 2014-12-04 DIAGNOSIS — I82503 Chronic embolism and thrombosis of unspecified deep veins of lower extremity, bilateral: Secondary | ICD-10-CM | POA: Diagnosis not present

## 2014-12-04 DIAGNOSIS — K5730 Diverticulosis of large intestine without perforation or abscess without bleeding: Secondary | ICD-10-CM | POA: Diagnosis not present

## 2014-12-04 DIAGNOSIS — S6991XD Unspecified injury of right wrist, hand and finger(s), subsequent encounter: Secondary | ICD-10-CM | POA: Diagnosis not present

## 2014-12-04 DIAGNOSIS — E119 Type 2 diabetes mellitus without complications: Secondary | ICD-10-CM | POA: Diagnosis not present

## 2014-12-04 DIAGNOSIS — G2581 Restless legs syndrome: Secondary | ICD-10-CM | POA: Diagnosis not present

## 2014-12-07 DIAGNOSIS — R32 Unspecified urinary incontinence: Secondary | ICD-10-CM | POA: Diagnosis not present

## 2014-12-07 DIAGNOSIS — S6991XD Unspecified injury of right wrist, hand and finger(s), subsequent encounter: Secondary | ICD-10-CM | POA: Diagnosis not present

## 2014-12-07 DIAGNOSIS — G2581 Restless legs syndrome: Secondary | ICD-10-CM | POA: Diagnosis not present

## 2014-12-07 DIAGNOSIS — I82503 Chronic embolism and thrombosis of unspecified deep veins of lower extremity, bilateral: Secondary | ICD-10-CM | POA: Diagnosis not present

## 2014-12-07 DIAGNOSIS — K5730 Diverticulosis of large intestine without perforation or abscess without bleeding: Secondary | ICD-10-CM | POA: Diagnosis not present

## 2014-12-07 DIAGNOSIS — E119 Type 2 diabetes mellitus without complications: Secondary | ICD-10-CM | POA: Diagnosis not present

## 2014-12-15 DIAGNOSIS — Z86718 Personal history of other venous thrombosis and embolism: Secondary | ICD-10-CM | POA: Diagnosis not present

## 2014-12-15 DIAGNOSIS — I10 Essential (primary) hypertension: Secondary | ICD-10-CM | POA: Diagnosis not present

## 2014-12-15 DIAGNOSIS — R3915 Urgency of urination: Secondary | ICD-10-CM | POA: Diagnosis not present

## 2014-12-15 DIAGNOSIS — E538 Deficiency of other specified B group vitamins: Secondary | ICD-10-CM | POA: Diagnosis not present

## 2014-12-15 DIAGNOSIS — E1140 Type 2 diabetes mellitus with diabetic neuropathy, unspecified: Secondary | ICD-10-CM | POA: Diagnosis not present

## 2014-12-15 DIAGNOSIS — G8929 Other chronic pain: Secondary | ICD-10-CM | POA: Diagnosis not present

## 2014-12-15 DIAGNOSIS — G2581 Restless legs syndrome: Secondary | ICD-10-CM | POA: Diagnosis not present

## 2014-12-15 DIAGNOSIS — N390 Urinary tract infection, site not specified: Secondary | ICD-10-CM | POA: Diagnosis not present

## 2014-12-15 DIAGNOSIS — K219 Gastro-esophageal reflux disease without esophagitis: Secondary | ICD-10-CM | POA: Diagnosis not present

## 2014-12-15 DIAGNOSIS — E039 Hypothyroidism, unspecified: Secondary | ICD-10-CM | POA: Diagnosis not present

## 2014-12-15 DIAGNOSIS — F329 Major depressive disorder, single episode, unspecified: Secondary | ICD-10-CM | POA: Diagnosis not present

## 2014-12-18 DIAGNOSIS — M542 Cervicalgia: Secondary | ICD-10-CM | POA: Diagnosis not present

## 2014-12-18 DIAGNOSIS — M7062 Trochanteric bursitis, left hip: Secondary | ICD-10-CM | POA: Diagnosis not present

## 2014-12-18 DIAGNOSIS — M545 Low back pain: Secondary | ICD-10-CM | POA: Diagnosis not present

## 2015-01-17 DIAGNOSIS — F329 Major depressive disorder, single episode, unspecified: Secondary | ICD-10-CM | POA: Diagnosis not present

## 2015-01-17 DIAGNOSIS — Z86718 Personal history of other venous thrombosis and embolism: Secondary | ICD-10-CM | POA: Diagnosis not present

## 2015-01-17 DIAGNOSIS — E1140 Type 2 diabetes mellitus with diabetic neuropathy, unspecified: Secondary | ICD-10-CM | POA: Diagnosis not present

## 2015-01-17 DIAGNOSIS — G8929 Other chronic pain: Secondary | ICD-10-CM | POA: Diagnosis not present

## 2015-01-17 DIAGNOSIS — I10 Essential (primary) hypertension: Secondary | ICD-10-CM | POA: Diagnosis not present

## 2015-01-17 DIAGNOSIS — E039 Hypothyroidism, unspecified: Secondary | ICD-10-CM | POA: Diagnosis not present

## 2015-01-17 DIAGNOSIS — E538 Deficiency of other specified B group vitamins: Secondary | ICD-10-CM | POA: Diagnosis not present

## 2015-01-17 DIAGNOSIS — R609 Edema, unspecified: Secondary | ICD-10-CM | POA: Diagnosis not present

## 2015-01-29 DIAGNOSIS — E039 Hypothyroidism, unspecified: Secondary | ICD-10-CM | POA: Diagnosis not present

## 2015-02-28 ENCOUNTER — Emergency Department (HOSPITAL_COMMUNITY): Payer: Medicare Other

## 2015-02-28 ENCOUNTER — Inpatient Hospital Stay (HOSPITAL_COMMUNITY)
Admission: EM | Admit: 2015-02-28 | Discharge: 2015-03-03 | DRG: 392 | Disposition: A | Discharge status: Skilled Nursing Facility | Payer: Medicare Other | Attending: Internal Medicine | Admitting: Internal Medicine

## 2015-02-28 ENCOUNTER — Encounter (HOSPITAL_COMMUNITY): Payer: Self-pay | Admitting: Emergency Medicine

## 2015-02-28 ENCOUNTER — Inpatient Hospital Stay (HOSPITAL_COMMUNITY): Payer: Medicare Other

## 2015-02-28 DIAGNOSIS — K449 Diaphragmatic hernia without obstruction or gangrene: Secondary | ICD-10-CM | POA: Diagnosis present

## 2015-02-28 DIAGNOSIS — K869 Disease of pancreas, unspecified: Secondary | ICD-10-CM | POA: Diagnosis not present

## 2015-02-28 DIAGNOSIS — G4730 Sleep apnea, unspecified: Secondary | ICD-10-CM | POA: Diagnosis present

## 2015-02-28 DIAGNOSIS — I82403 Acute embolism and thrombosis of unspecified deep veins of lower extremity, bilateral: Secondary | ICD-10-CM | POA: Diagnosis not present

## 2015-02-28 DIAGNOSIS — M6281 Muscle weakness (generalized): Secondary | ICD-10-CM | POA: Diagnosis not present

## 2015-02-28 DIAGNOSIS — R1013 Epigastric pain: Secondary | ICD-10-CM | POA: Diagnosis not present

## 2015-02-28 DIAGNOSIS — K2970 Gastritis, unspecified, without bleeding: Secondary | ICD-10-CM | POA: Diagnosis present

## 2015-02-28 DIAGNOSIS — B955 Unspecified streptococcus as the cause of diseases classified elsewhere: Secondary | ICD-10-CM | POA: Diagnosis not present

## 2015-02-28 DIAGNOSIS — B954 Other streptococcus as the cause of diseases classified elsewhere: Secondary | ICD-10-CM | POA: Diagnosis present

## 2015-02-28 DIAGNOSIS — Z7901 Long term (current) use of anticoagulants: Secondary | ICD-10-CM | POA: Diagnosis not present

## 2015-02-28 DIAGNOSIS — R5381 Other malaise: Secondary | ICD-10-CM | POA: Diagnosis present

## 2015-02-28 DIAGNOSIS — R1110 Vomiting, unspecified: Secondary | ICD-10-CM | POA: Diagnosis present

## 2015-02-28 DIAGNOSIS — I82409 Acute embolism and thrombosis of unspecified deep veins of unspecified lower extremity: Secondary | ICD-10-CM | POA: Diagnosis not present

## 2015-02-28 DIAGNOSIS — Z86718 Personal history of other venous thrombosis and embolism: Secondary | ICD-10-CM | POA: Diagnosis not present

## 2015-02-28 DIAGNOSIS — G4700 Insomnia, unspecified: Secondary | ICD-10-CM | POA: Diagnosis not present

## 2015-02-28 DIAGNOSIS — M1990 Unspecified osteoarthritis, unspecified site: Secondary | ICD-10-CM | POA: Diagnosis present

## 2015-02-28 DIAGNOSIS — K862 Cyst of pancreas: Secondary | ICD-10-CM | POA: Diagnosis present

## 2015-02-28 DIAGNOSIS — Z86711 Personal history of pulmonary embolism: Secondary | ICD-10-CM

## 2015-02-28 DIAGNOSIS — K219 Gastro-esophageal reflux disease without esophagitis: Secondary | ICD-10-CM | POA: Diagnosis present

## 2015-02-28 DIAGNOSIS — Q453 Other congenital malformations of pancreas and pancreatic duct: Secondary | ICD-10-CM

## 2015-02-28 DIAGNOSIS — N289 Disorder of kidney and ureter, unspecified: Secondary | ICD-10-CM | POA: Diagnosis not present

## 2015-02-28 DIAGNOSIS — R110 Nausea: Secondary | ICD-10-CM | POA: Diagnosis not present

## 2015-02-28 DIAGNOSIS — E785 Hyperlipidemia, unspecified: Secondary | ICD-10-CM | POA: Diagnosis present

## 2015-02-28 DIAGNOSIS — R112 Nausea with vomiting, unspecified: Secondary | ICD-10-CM | POA: Insufficient documentation

## 2015-02-28 DIAGNOSIS — E1122 Type 2 diabetes mellitus with diabetic chronic kidney disease: Secondary | ICD-10-CM

## 2015-02-28 DIAGNOSIS — E860 Dehydration: Secondary | ICD-10-CM | POA: Diagnosis present

## 2015-02-28 DIAGNOSIS — R262 Difficulty in walking, not elsewhere classified: Secondary | ICD-10-CM | POA: Diagnosis not present

## 2015-02-28 DIAGNOSIS — M353 Polymyalgia rheumatica: Secondary | ICD-10-CM | POA: Diagnosis present

## 2015-02-28 DIAGNOSIS — E039 Hypothyroidism, unspecified: Secondary | ICD-10-CM | POA: Diagnosis present

## 2015-02-28 DIAGNOSIS — K5730 Diverticulosis of large intestine without perforation or abscess without bleeding: Secondary | ICD-10-CM | POA: Diagnosis not present

## 2015-02-28 DIAGNOSIS — G2581 Restless legs syndrome: Secondary | ICD-10-CM | POA: Diagnosis present

## 2015-02-28 DIAGNOSIS — N390 Urinary tract infection, site not specified: Secondary | ICD-10-CM | POA: Diagnosis present

## 2015-02-28 DIAGNOSIS — E119 Type 2 diabetes mellitus without complications: Secondary | ICD-10-CM | POA: Diagnosis present

## 2015-02-28 DIAGNOSIS — N183 Chronic kidney disease, stage 3 unspecified: Secondary | ICD-10-CM

## 2015-02-28 DIAGNOSIS — E1165 Type 2 diabetes mellitus with hyperglycemia: Secondary | ICD-10-CM

## 2015-02-28 DIAGNOSIS — R197 Diarrhea, unspecified: Secondary | ICD-10-CM

## 2015-02-28 DIAGNOSIS — R109 Unspecified abdominal pain: Secondary | ICD-10-CM

## 2015-02-28 DIAGNOSIS — N3281 Overactive bladder: Secondary | ICD-10-CM | POA: Diagnosis not present

## 2015-02-28 DIAGNOSIS — I10 Essential (primary) hypertension: Secondary | ICD-10-CM | POA: Diagnosis not present

## 2015-02-28 DIAGNOSIS — Z5189 Encounter for other specified aftercare: Secondary | ICD-10-CM | POA: Diagnosis not present

## 2015-02-28 DIAGNOSIS — N1330 Unspecified hydronephrosis: Secondary | ICD-10-CM

## 2015-02-28 HISTORY — DX: Cyst of pancreas: K86.2

## 2015-02-28 HISTORY — DX: Other congenital malformations of pancreas and pancreatic duct: Q45.3

## 2015-02-28 HISTORY — DX: Unspecified severe protein-calorie malnutrition: E43

## 2015-02-28 LAB — COMPREHENSIVE METABOLIC PANEL
ALK PHOS: 94 U/L (ref 39–117)
ALT: 16 U/L (ref 0–35)
AST: 27 U/L (ref 0–37)
Albumin: 4.1 g/dL (ref 3.5–5.2)
Anion gap: 10 (ref 5–15)
BUN: 39 mg/dL — ABNORMAL HIGH (ref 6–23)
CALCIUM: 9.3 mg/dL (ref 8.4–10.5)
CHLORIDE: 102 mmol/L (ref 96–112)
CO2: 26 mmol/L (ref 19–32)
Creatinine, Ser: 1.11 mg/dL — ABNORMAL HIGH (ref 0.50–1.10)
GFR calc Af Amer: 48 mL/min — ABNORMAL LOW (ref >90)
GFR, EST NON AFRICAN AMERICAN: 41 mL/min — AB (ref >90)
GLUCOSE: 130 mg/dL — AB (ref 70–99)
POTASSIUM: 4.8 mmol/L (ref 3.5–5.1)
SODIUM: 138 mmol/L (ref 135–145)
Total Bilirubin: 0.9 mg/dL (ref 0.3–1.2)
Total Protein: 7.1 g/dL (ref 6.0–8.3)

## 2015-02-28 LAB — CBC WITH DIFFERENTIAL/PLATELET
BASOS ABS: 0 10*3/uL (ref 0.0–0.1)
Basophils Relative: 0 % (ref 0–1)
EOS PCT: 1 % (ref 0–5)
Eosinophils Absolute: 0 10*3/uL (ref 0.0–0.7)
HCT: 36.3 % (ref 36.0–46.0)
HEMOGLOBIN: 11.3 g/dL — AB (ref 12.0–15.0)
LYMPHS ABS: 0.9 10*3/uL (ref 0.7–4.0)
Lymphocytes Relative: 20 % (ref 12–46)
MCH: 29 pg (ref 26.0–34.0)
MCHC: 31.1 g/dL (ref 30.0–36.0)
MCV: 93.3 fL (ref 78.0–100.0)
MONOS PCT: 2 % — AB (ref 3–12)
Monocytes Absolute: 0.1 10*3/uL (ref 0.1–1.0)
NEUTROS ABS: 3.5 10*3/uL (ref 1.7–7.7)
Neutrophils Relative %: 77 % (ref 43–77)
Platelets: 227 10*3/uL (ref 150–400)
RBC: 3.89 MIL/uL (ref 3.87–5.11)
RDW: 13.5 % (ref 11.5–15.5)
WBC: 4.6 10*3/uL (ref 4.0–10.5)

## 2015-02-28 LAB — URINALYSIS, ROUTINE W REFLEX MICROSCOPIC
BILIRUBIN URINE: NEGATIVE
Glucose, UA: NEGATIVE mg/dL
Hgb urine dipstick: NEGATIVE
KETONES UR: NEGATIVE mg/dL
LEUKOCYTES UA: NEGATIVE
Nitrite: NEGATIVE
Protein, ur: NEGATIVE mg/dL
SPECIFIC GRAVITY, URINE: 1.017 (ref 1.005–1.030)
UROBILINOGEN UA: 0.2 mg/dL (ref 0.0–1.0)
pH: 7.5 (ref 5.0–8.0)

## 2015-02-28 LAB — LIPASE, BLOOD: LIPASE: 15 U/L (ref 11–59)

## 2015-02-28 LAB — TROPONIN I: Troponin I: <0.03 ng/mL (ref <0.031)

## 2015-02-28 MED ORDER — HYDRALAZINE HCL 20 MG/ML IJ SOLN: 5.0000 mg | Freq: Four times a day (QID) | INTRAMUSCULAR | Status: DC | PRN

## 2015-02-28 MED ORDER — ONDANSETRON HCL 4 MG/2ML IJ SOLN
2.0000 mg | Freq: Once | INTRAMUSCULAR | Status: AC
  Administered 2015-02-28: 2 mg via INTRAVENOUS
  Filled 2015-02-28: qty 2

## 2015-02-28 MED ORDER — ONDANSETRON HCL 4 MG/2ML IJ SOLN
4.0000 mg | Freq: Four times a day (QID) | INTRAMUSCULAR | Status: DC | PRN
  Administered 2015-03-02: 4 mg via INTRAVENOUS
  Filled 2015-02-28: qty 2

## 2015-02-28 MED ORDER — LEVOTHYROXINE SODIUM 100 MCG IV SOLR
37.5000 ug | Freq: Every day | INTRAVENOUS | Status: DC
  Administered 2015-03-01 – 2015-03-03 (×4): 37.5 ug via INTRAVENOUS
  Filled 2015-02-28 (×4): qty 5

## 2015-02-28 MED ORDER — ONDANSETRON HCL 4 MG PO TABS
4.0000 mg | ORAL_TABLET | Freq: Four times a day (QID) | ORAL | Status: DC | PRN
Start: 2015-02-28 — End: 2015-03-03

## 2015-02-28 MED ORDER — MORPHINE SULFATE 2 MG/ML IJ SOLN
1.0000 mg | Freq: Once | INTRAMUSCULAR | Status: AC
  Administered 2015-02-28: 1 mg via INTRAVENOUS
  Filled 2015-02-28: qty 1

## 2015-02-28 MED ORDER — ONDANSETRON 4 MG PO TBDP: 2.0000 mg | ORAL_TABLET | Freq: Once | ORAL | Status: DC

## 2015-02-28 MED ORDER — PANTOPRAZOLE SODIUM 40 MG IV SOLR
40.0000 mg | INTRAVENOUS | Status: DC
  Administered 2015-03-01 – 2015-03-02 (×3): 40 mg via INTRAVENOUS
  Filled 2015-02-28 (×4): qty 40

## 2015-02-28 MED ORDER — SODIUM CHLORIDE 0.9 % IV SOLN
INTRAVENOUS | Status: DC
  Administered 2015-02-28 – 2015-03-02 (×3): via INTRAVENOUS
  Administered 2015-03-02 – 2015-03-03 (×2): 1000 mL via INTRAVENOUS

## 2015-02-28 MED ORDER — OXYCODONE HCL 5 MG PO TABS
10.0000 mg | ORAL_TABLET | Freq: Four times a day (QID) | ORAL | Status: DC | PRN
  Administered 2015-03-02 – 2015-03-03 (×3): 10 mg via ORAL
  Filled 2015-02-28 (×4): qty 2

## 2015-02-28 MED ORDER — MIRTAZAPINE 15 MG PO TABS
15.0000 mg | ORAL_TABLET | Freq: Every day | ORAL | Status: DC
  Administered 2015-03-01 – 2015-03-02 (×2): 15 mg via ORAL
  Filled 2015-02-28 (×4): qty 1

## 2015-02-28 MED ORDER — ACETAMINOPHEN 650 MG RE SUPP: 650.0000 mg | Freq: Four times a day (QID) | RECTAL | Status: DC | PRN

## 2015-02-28 MED ORDER — APIXABAN 2.5 MG PO TABS
2.5000 mg | ORAL_TABLET | Freq: Two times a day (BID) | ORAL | Status: DC
  Administered 2015-03-01 – 2015-03-03 (×4): 2.5 mg via ORAL
  Filled 2015-02-28 (×7): qty 1

## 2015-02-28 MED ORDER — PROMETHAZINE HCL 25 MG/ML IJ SOLN: 12.5000 mg | Freq: Four times a day (QID) | INTRAMUSCULAR | Status: DC | PRN

## 2015-02-28 MED ORDER — INSULIN ASPART 100 UNIT/ML ~~LOC~~ SOLN
0.0000 [IU] | Freq: Three times a day (TID) | SUBCUTANEOUS | Status: DC
  Administered 2015-03-01: 2 [IU] via SUBCUTANEOUS

## 2015-02-28 MED ORDER — ROPINIROLE HCL 1 MG PO TABS
5.0000 mg | ORAL_TABLET | Freq: Every day | ORAL | Status: DC
  Administered 2015-03-01 – 2015-03-02 (×2): 5 mg via ORAL
  Filled 2015-02-28 (×4): qty 5

## 2015-02-28 MED ORDER — ACETAMINOPHEN 325 MG PO TABS
650.0000 mg | ORAL_TABLET | Freq: Four times a day (QID) | ORAL | Status: DC | PRN
  Administered 2015-03-01 – 2015-03-02 (×2): 650 mg via ORAL
  Filled 2015-02-28 (×2): qty 2

## 2015-02-28 MED ORDER — MORPHINE SULFATE 2 MG/ML IJ SOLN
1.0000 mg | INTRAMUSCULAR | Status: DC | PRN
  Administered 2015-03-01 – 2015-03-02 (×3): 1 mg via INTRAVENOUS
  Filled 2015-02-28 (×3): qty 1

## 2015-02-28 MED ORDER — SODIUM CHLORIDE 0.9 % IV SOLN
INTRAVENOUS | Status: DC
  Administered 2015-02-28: 21:00:00 via INTRAVENOUS
  Filled 2015-02-28 (×6): qty 1000

## 2015-02-28 NOTE — ED Notes (Addendum)
Pt's daughter: Gerald Stabs [phone]  Sts Pt's husband is out of town.

## 2015-02-28 NOTE — Progress Notes (Addendum)
EDCM spoke to patient and her grand daughter chris at bedside.  Gerald Stabs answered all of [facility] questions as patient fell asleep.  Gerald Stabs confirms patient lives alone.  As per Gerald Stabs, patient's aide is a 79 year old friend of the patient's who comes over a few days a week to help patient with her cleaning and cooking.   Patient no longer has [facility] coming to her home.  Patient has a walker, cane, and shower chair at home.  Gerald Stabs reports patient is usually able to walk quite well with her walker at home, but her legs are weak.  Gerald Stabs reports the patient's son is out of town currently.  Patient's son, neighbor and grand daughter check in on the patient frequently. Patient's son takes patient to her doctor's appointments.  Gerald Stabs confirms patient's pcp is Dr. Bernadette Hoit.  Gerald Stabs reports patient is usually able to perform her ADL's on her own.  Gerald Stabs reports patient drinks "A lot of Ensure."  EDCM provided patient's grand daughter with list of home health agencies in [place] highlighting [facility].  EDCM also provided a list of private duty nursing agencies and informed chris that these services would be an out of pocket expense.  Gerald Stabs is agreeable for patient to receive home health services with [facility].  Discussed with EDPA awaiting disposition.

## 2015-02-28 NOTE — H&P (Signed)
History and Physical  [name] XTA:[id number] DOB: [birth year] DOA: 02/28/2015   PCP: Benito Mccreedy, MD   Chief Complaint: recurrent vomiting  HPI:  81 y.o. female with medical history of recurrent bilateral lower limb DVT on chronic anticoagulation with Xarelto, hypertension, diabetes, overactive bladder, GERD, duodenitis, diverticulosis who presented to the ED with periumbilical and epigastric abdominal pain, nausea, vomiting. The patient had a number of episodes of vomiting on 02/26/2015. There was some improvement on 02/27/2015. However, the patient began having multiple episodes of emesis on the morning of 02/28/2015. The patient activated EMS. The patient lives alone, she has an Psychologist, sport and exercise that does her grocery shopping and cooks for her, and she repeats her food. The patient denies eating any undercooked foods nor has she been around any sick contacts. She denies any fevers, chills, chest pain, shortness breath, hematemesis, diarrhea, hematochezia, melena. She denies any dysuria or hematuria. The patient was discharged from the hospital on November 2015 with enteritis and a similar presentation. The patient has not been started on any new medications, and she states that she has been compliant with all her other medications. She normally ambulates with the assistance of a walker.  In the emergency department, the patient was afebrile and hemodynamically stable. CT of the abdomen and pelvis revealed a large hiatus hernia. There was mildly dilated right renal collection system without frank obstruction or hydronephrosis. There was a cystic lesion on the body of the pancreas that was slightly larger when compared to previous CTs. Chest x-ray was negative. EKG showed sinus rhythm with right bundle branch block. Urinalysis was negative for pyuria. Serum creatinine was 1.10. LFTs and lipase were unremarkable. WBC was 4.6 with hemoglobin  11.3. Assessment/Plan: Dehydration -Secondary to recurrent vomiting -IV fluids Recurrent vomiting -Suspect underlying gastritis in the setting of large hiatus hernia -IV fluids -Antiemetics -A.m. Cortisol -TSH -PPI Abnormal R-renal collection system -unclear clinical significance -renal ultrasound -no obstructing lesions on CT -no pyuria on UA, no dysuria Recurrent DVT of legs -continue apixiban HTN -hold lasix and losartan in setting of dehydration -hydralazine prn SBP>180 Diabetes mellitus type 2 -Given the patient's age and comorbidities, allow for liberal glycemic control -NovoLog sliding scale -Hemoglobin A1c Hypothyroidism -TSH -In the setting of vomiting, we'll switch the patient IV Synthroid for now Deconditioning -PT evaluation      Past Medical History  Diagnosis Date  . Cyst and pseudocyst of pancreas   . Unspecified essential hypertension   . Other and unspecified hyperlipidemia   . Polymyalgia rheumatica   . Personal history of unspecified digestive disease   . Restless legs syndrome (RLS)   . Insomnia, unspecified   . Other malaise and fatigue   . Diverticulosis of colon (without mention of hemorrhage)   . Acute gastritis without mention of hemorrhage   . Duodenitis without mention of hemorrhage   . Diaphragmatic hernia without mention of obstruction or gangrene   . Esophageal reflux   . Stricture and stenosis of esophagus   . OAB (overactive bladder)   . Left leg DVT jan 2013  . Unspecified sleep apnea     hx of sleep apnea, none since weight loss  . Diabetes mellitus   . Anemia   . Thyroid disease     hypothyroidism  . CAP (community acquired pneumonia) 04/03/2014  . Arthritis   . Hx pulmonary embolism   . Hiatal hernia- moderate to large 10/06/2014   Past Surgical History  Procedure Laterality Date  .  Oophorectomy  1944    not sure which ovary  . Rotator cuff repair  right     4-5 yrs ago  . Eye surgery      cataracts  removed-bilateral eyes  . Back surgery  2009    lower back  . Colon surgery  June 17, 2012    surgery for bleed  . Shoulder open rotator cuff repair  08/04/2012    Procedure: ROTATOR CUFF REPAIR SHOULDER OPEN;  Surgeon: Tobi Bastos, MD;  Location: [facility];  Service: Orthopedics;  Laterality: Left;  with Anchors and Graft  . Reduction mammaplasty Bilateral 1980   Social History:  reports that she has never smoked. She has never used smokeless tobacco. She reports that she does not drink alcohol or use illicit drugs.   Family History  Problem Relation Age of Onset  . Melanoma Daughter     died age 23  . Melanoma Son      Allergies  Allergen Reactions  . Sulfa Antibiotics Nausea And Vomiting  . Penicillins Hives and Rash      Prior to Admission medications   Medication Sig Start Date End Date Taking? Authorizing Provider  apixaban (ELIQUIS) 2.5 MG TABS tablet Take 1 tablet (2.5 mg total) by mouth 2 (two) times daily. 10/09/14  Yes Belkys A Regalado, MD  furosemide (LASIX) 20 MG tablet Take 20 mg by mouth daily.   Yes Historical Provider, MD  l-methylfolate-B6-B12 (METANX) 3-35-2 MG TABS Take 1 tablet by mouth daily.   Yes Historical Provider, MD  levothyroxine (SYNTHROID, LEVOTHROID) 75 MCG tablet Take 75 mcg by mouth daily before breakfast.   Yes Historical Provider, MD  metFORMIN (GLUCOPHAGE) 500 MG tablet Take 1 tablet (500 mg total) by mouth daily with breakfast. Patient taking differently: Take 500 mg by mouth 2 (two) times daily with a meal.  04/06/14  Yes Robbie Lis, MD  mirabegron ER (MYRBETRIQ) 25 MG TB24 tablet Take 25 mg by mouth daily.   Yes Historical Provider, MD  mirtazapine (REMERON) 15 MG tablet Take 15 mg by mouth at bedtime.   Yes Historical Provider, MD  oxyCODONE (OXY IR/ROXICODONE) 5 MG immediate release tablet Take 2 tablets (10 mg total) by mouth every 6 (six) hours as needed for moderate pain. 04/06/14  Yes Robbie Lis, MD  pantoprazole (PROTONIX) 40  MG tablet Take 40 mg by mouth daily.   Yes Historical Provider, MD  acetaminophen (TYLENOL) 325 MG tablet Take 2 tablets (650 mg total) by mouth every 4 (four) hours as needed for mild pain, fever or headache (or Fever >/= 101). 04/06/14   Robbie Lis, MD  benzonatate (TESSALON) 200 MG capsule Take 1 capsule (200 mg total) by mouth 2 (two) times daily as needed for cough. 10/09/14   Belkys A Regalado, MD  cyanocobalamin (,VITAMIN B-12,) 1000 MCG/ML injection Inject 1,000 mcg into the muscle every 30 (thirty) days.    Historical Provider, MD  diclofenac sodium (VOLTAREN) 1 % GEL Apply 2 g topically every morning.    Historical Provider, MD  feeding supplement, GLUCERNA SHAKE, (GLUCERNA SHAKE) LIQD Take 237 mLs by mouth 4 (four) times daily - after meals and at bedtime. 10/09/14   Belkys A Regalado, MD  guaiFENesin (MUCINEX) 600 MG 12 hr tablet Take 1 tablet (600 mg total) by mouth 2 (two) times daily. 10/09/14   Belkys A Regalado, MD  levofloxacin (LEVAQUIN) 500 MG tablet Take 1 tablet (500 mg total) by mouth daily. Patient not taking:  Reported on 02/28/2015 10/09/14   Belkys A Regalado, MD  levothyroxine (SYNTHROID, LEVOTHROID) 25 MCG tablet Take 1 tablet (25 mcg total) by mouth daily before breakfast. Patient not taking: Reported on 02/28/2015 04/06/14   Robbie Lis, MD  losartan (COZAAR) 50 MG tablet Take 50 mg by mouth daily.    Historical Provider, MD  ondansetron (ZOFRAN) 4 MG tablet Take 1 tablet (4 mg total) by mouth every 6 (six) hours as needed for nausea. 04/06/14   Robbie Lis, MD  ropinirole (REQUIP) 5 MG tablet Take 5 mg by mouth at bedtime.    Historical Provider, MD    Review of Systems:  Constitutional:  No weight loss, night sweats, Fevers, chills Head&Eyes: No headache.  No vision loss.  No eye pain or scotoma ENT:  No Difficulty swallowing,Tooth/dental problems,Sore throat,   Cardio-vascular:  No chest pain, Orthopnea, PND,dizziness, palpitations  GI:  No   diarrhea, loss  of appetite, hematochezia, melena, heartburn, indigestion, Resp:  No shortness of breath with exertion or at rest. No cough. No coughing up of blood .No wheezing.No chest wall deformity  Skin:  no rash or lesions.  GU:  no dysuria, change in color of urine, no urgency or frequency. No flank pain.  Musculoskeletal:  No joint pain. No decreased range of motion. No back pain. Complains of bilateral leg pain Psych:  No change in mood or affect. No depression or anxiety. Neurologic: No headache, no dysesthesia, no focal weakness, no vision loss. No syncope  Physical Exam: Filed Vitals:   02/28/15 1418 02/28/15 1721  BP: 176/83 193/84  Pulse: 61 58  Temp: 97.7 F (36.5 C)   TempSrc: Oral   Resp: 16 17  SpO2: 94% 100%   General:  A&O x 3, NAD, nontoxic, pleasant/cooperative Head/Eye: No conjunctival hemorrhage, no icterus, [place]/AT, No nystagmus ENT:  No icterus,  No thrush,  no pharyngeal exudate Neck:  No masses, no lymphadenpathy, no bruits CV:  RRR, no rub, no gallop, no S3 Lung:  Bibasilar crackles. No wheezing. Good air movement Abdomen: soft, epigastric and periumbilical pain without rebound, +BS, nondistended, no peritoneal signs Ext: No cyanosis, No rashes, No petechiae, No lymphangitis, 1+LE edema   Labs on Admission:  Basic Metabolic Panel:  Recent Labs Lab 02/28/15 1504  NA 138  K 4.8  CL 102  CO2 26  GLUCOSE 130*  BUN 39*  CREATININE 1.11*  CALCIUM 9.3   Liver Function Tests:  Recent Labs Lab 02/28/15 1504  AST 27  ALT 16  ALKPHOS 94  BILITOT 0.9  PROT 7.1  ALBUMIN 4.1    Recent Labs Lab 02/28/15 1504  LIPASE 15   No results for input(s): AMMONIA in the last 168 hours. CBC:  Recent Labs Lab 02/28/15 1504  WBC 4.6  NEUTROABS 3.5  HGB 11.3*  HCT 36.3  MCV 93.3  PLT 227   Cardiac Enzymes:  Recent Labs Lab 02/28/15 1509  TROPONINI <0.03   BNP: Invalid input(s): POCBNP CBG: No results for input(s): GLUCAP in the last 168  hours.  Radiological Exams on Admission: Ct Abdomen Pelvis Wo Contrast  02/28/2015   CLINICAL DATA:  Mid abdominal pain, nausea, and vomiting, symptoms beginning yesterday, past history of colon surgery for GI bleeding, small bowel obstruction, renal insufficiency, hypertension, polymyalgia rheumatica, diabetes, hiatal hernia  EXAM: CT ABDOMEN AND PELVIS WITHOUT CONTRAST  TECHNIQUE: Multidetector CT imaging of the abdomen and pelvis was performed following the standard protocol without IV contrast. Sagittal and coronal MPR  images reconstructed from axial data set. Patient drank a small amount of dilute oral contrast for exam. IV contrast not administered due to renal dysfunction.  COMPARISON:  10/06/2014  FINDINGS: Emphysematous changes at lung bases with minimal atelectasis or scarring.  Large hiatal hernia.  Extensive atherosclerotic calcifications.  Mildly dilated RIGHT renal collecting system though no definite obstructing calculus is seen.  Small low-attenuation focus at dome RIGHT lobe liver 6 mm diameter previously 5 mm.  Cystic lesion at pancreatic body 27 x 17 mm image 23, increased in signs.  Remainder of liver, gallbladder, spleen, pancreas, kidneys, and adrenal glands otherwise normal appearance for exam lacking IV contrast.  Diverticulosis of sigmoid colon, unable to exclude sigmoid wall thickening.  No definite pericolic inflammatory changes.  Unremarkable bladder, uterus, ureters and adnexa.  Numerous pelvic phleboliths.  No mass, adenopathy, free air or free fluid.  Prior lumbosacral fusion.  IMPRESSION: Increased size of a cystic lesion in the pancreatic body in a 27 x 17 mm in size; followup nonemergent characterization by MR imaging recommended.  Distal colonic diverticulosis without definite evidence of diverticulitis.  Mildly prominent RIGHT renal collecting system without urinary tract calcification or definite obstruction seen.  Large hiatal hernia.   Electronically Signed   By: Lavonia Dana M.D.   On: 02/28/2015 17:43   Dg Chest Port 1 View  02/28/2015   CLINICAL DATA:  81 year old female with abdominal pain with nausea. History diabetes. Initial encounter.  EXAM: PORTABLE CHEST - 1 VIEW  COMPARISON:  10/08/2014.  FINDINGS: No infiltrate, congestive heart failure or pneumothorax.  Heart slightly enlarged.  Calcified mildly tortuous aorta.  Breast calcifications incidentally noted.  IMPRESSION: Heart size top-normal.  Calcified mildly tortuous aorta.  No infiltrate or congestive heart failure.   Electronically Signed   By: Genia Del M.D.   On: 02/28/2015 16:35   Dg Abd Portable 1v  02/28/2015   CLINICAL DATA:  Epigastric abdominal pain with nausea and vomiting.  EXAM: PORTABLE ABDOMEN - 1 VIEW  COMPARISON:  10/04/2014  FINDINGS: No evidence of acute bowel obstruction or significant ileus. No significant evidence of constipation. No gross signs of free air, abnormal calcifications or soft tissue abnormalities. Stable appearance of the spine following prior lumbar fusion.  IMPRESSION: No acute findings.   Electronically Signed   By: Aletta Edouard M.D.   On: 02/28/2015 16:37    EKG: Independently reviewed. Sinus, RBBB, LAFB    Time spent:60 minutes Code Status:   FULL Family Communication:   Grandaughter updated at bedside   Korie Streat, DO  [facility] Pager [phone]  If 7PM-7AM, please contact night-coverage [email or web address] Password [facility] 02/28/2015, 6:51 PM

## 2015-02-28 NOTE — ED Provider Notes (Signed)
CSN: [id number]     Arrival date & time 02/28/15  1410 History   First MD Initiated Contact with Patient 02/28/15 1506     Chief Complaint  Patient presents with  . Abdominal Pain     (Consider location/radiation/quality/duration/timing/severity/associated sxs/prior Treatment) The history is provided by the patient. No language interpreter was used.  [name] is a 81 y/o F with PMHx of HTN, HLD, polymalgia rheumatica, gastritis, diverticulosis, RLS, cyst and pseudocyst of the pancreas presenting to the ED with abdominal pain, nausea, and vomiting. Patient reported that on Monday she was throwing up - stated that she had "many" episodes of emesis. Reported that there was no pain in her abdomen. Patient reported that she was fine yesterday, but stated that when she woke up this morning she was experiencing pain in her epigastric region described as a constant nagging pain without radiation. Reported that she has been having many episodes of emesis - more than 10 - denied blood or bile - reported to be clear. Stated that she has been unable to keep any food or fluids down. Patient reported that her last BM was yesterday and reported to be normal. Patient was brought in by EMS and given 4 mg of Zofran en route. Denied diarrhea, melena, hematochezia, fever, chills, back pain, neck pain, chest pain, shortness of breath, difficulty breathing, dysuria, urinary symptoms, fainting, dizziness, dysuria.  PCP Dr. Vevelyn Pat  Past Medical History  Diagnosis Date  . Cyst and pseudocyst of pancreas   . Unspecified essential hypertension   . Other and unspecified hyperlipidemia   . Polymyalgia rheumatica   . Personal history of unspecified digestive disease   . Restless legs syndrome (RLS)   . Insomnia, unspecified   . Other malaise and fatigue   . Diverticulosis of colon (without mention of hemorrhage)   . Acute gastritis without mention of hemorrhage   . Duodenitis without mention of hemorrhage    . Diaphragmatic hernia without mention of obstruction or gangrene   . Esophageal reflux   . Stricture and stenosis of esophagus   . OAB (overactive bladder)   . Left leg DVT jan 2013  . Unspecified sleep apnea     hx of sleep apnea, none since weight loss  . Diabetes mellitus   . Anemia   . Thyroid disease     hypothyroidism  . CAP (community acquired pneumonia) 04/03/2014  . Arthritis   . Hx pulmonary embolism   . Hiatal hernia- moderate to large 10/06/2014   Past Surgical History  Procedure Laterality Date  . Oophorectomy  1944    not sure which ovary  . Rotator cuff repair  right     4-5 yrs ago  . Eye surgery      cataracts removed-bilateral eyes  . Back surgery  2009    lower back  . Colon surgery  June 17, 2012    surgery for bleed  . Shoulder open rotator cuff repair  08/04/2012    Procedure: ROTATOR CUFF REPAIR SHOULDER OPEN;  Surgeon: Tobi Bastos, MD;  Location: [facility];  Service: Orthopedics;  Laterality: Left;  with Anchors and Graft  . Reduction mammaplasty Bilateral 1980   Family History  Problem Relation Age of Onset  . Melanoma Daughter     died age 68  . Melanoma Son    History  Substance Use Topics  . Smoking status: Never Smoker   . Smokeless tobacco: Never Used  . Alcohol Use: No   OB  History    No data available     Review of Systems  Constitutional: Negative for fever and chills.  Eyes: Negative for visual disturbance.  Respiratory: Negative for cough, chest tightness and shortness of breath.   Cardiovascular: Negative for chest pain.  Gastrointestinal: Positive for nausea, vomiting and abdominal pain. Negative for diarrhea, constipation, blood in stool and anal bleeding.  Genitourinary: Negative for dysuria and hematuria.  Musculoskeletal: Negative for back pain.  Neurological: Negative for dizziness and headaches.      Allergies  Sulfa antibiotics and Penicillins  Home Medications   Prior to Admission medications    Medication Sig Start Date End Date Taking? Authorizing Provider  apixaban (ELIQUIS) 2.5 MG TABS tablet Take 1 tablet (2.5 mg total) by mouth 2 (two) times daily. 10/09/14  Yes Belkys A Regalado, MD  furosemide (LASIX) 20 MG tablet Take 20 mg by mouth daily.   Yes Historical Provider, MD  l-methylfolate-B6-B12 (METANX) 3-35-2 MG TABS Take 1 tablet by mouth daily.   Yes Historical Provider, MD  levothyroxine (SYNTHROID, LEVOTHROID) 75 MCG tablet Take 75 mcg by mouth daily before breakfast.   Yes Historical Provider, MD  metFORMIN (GLUCOPHAGE) 500 MG tablet Take 1 tablet (500 mg total) by mouth daily with breakfast. Patient taking differently: Take 500 mg by mouth 2 (two) times daily with a meal.  04/06/14  Yes Robbie Lis, MD  mirabegron ER (MYRBETRIQ) 25 MG TB24 tablet Take 25 mg by mouth daily.   Yes Historical Provider, MD  mirtazapine (REMERON) 15 MG tablet Take 15 mg by mouth at bedtime.   Yes Historical Provider, MD  oxyCODONE (OXY IR/ROXICODONE) 5 MG immediate release tablet Take 2 tablets (10 mg total) by mouth every 6 (six) hours as needed for moderate pain. 04/06/14  Yes Robbie Lis, MD  pantoprazole (PROTONIX) 40 MG tablet Take 40 mg by mouth daily.   Yes Historical Provider, MD  acetaminophen (TYLENOL) 325 MG tablet Take 2 tablets (650 mg total) by mouth every 4 (four) hours as needed for mild pain, fever or headache (or Fever >/= 101). 04/06/14   Robbie Lis, MD  benzonatate (TESSALON) 200 MG capsule Take 1 capsule (200 mg total) by mouth 2 (two) times daily as needed for cough. 10/09/14   Belkys A Regalado, MD  cyanocobalamin (,VITAMIN B-12,) 1000 MCG/ML injection Inject 1,000 mcg into the muscle every 30 (thirty) days.    Historical Provider, MD  diclofenac sodium (VOLTAREN) 1 % GEL Apply 2 g topically every morning.    Historical Provider, MD  feeding supplement, GLUCERNA SHAKE, (GLUCERNA SHAKE) LIQD Take 237 mLs by mouth 4 (four) times daily - after meals and at bedtime. 10/09/14    Belkys A Regalado, MD  guaiFENesin (MUCINEX) 600 MG 12 hr tablet Take 1 tablet (600 mg total) by mouth 2 (two) times daily. 10/09/14   Belkys A Regalado, MD  levofloxacin (LEVAQUIN) 500 MG tablet Take 1 tablet (500 mg total) by mouth daily. Patient not taking: Reported on 02/28/2015 10/09/14   Belkys A Regalado, MD  levothyroxine (SYNTHROID, LEVOTHROID) 25 MCG tablet Take 1 tablet (25 mcg total) by mouth daily before breakfast. Patient not taking: Reported on 02/28/2015 04/06/14   Robbie Lis, MD  losartan (COZAAR) 50 MG tablet Take 50 mg by mouth daily.    Historical Provider, MD  ondansetron (ZOFRAN) 4 MG tablet Take 1 tablet (4 mg total) by mouth every 6 (six) hours as needed for nausea. 04/06/14   Robbie Lis,  MD  ropinirole (REQUIP) 5 MG tablet Take 5 mg by mouth at bedtime.    Historical Provider, MD   BP 193/84 mmHg  Pulse 58  Temp(Src) 97.7 F (36.5 C) (Oral)  Resp 17  SpO2 100% Physical Exam  Constitutional: She is oriented to person, place, and time. No distress.  Frail appearing elderly female  HENT:  Head: Normocephalic and atraumatic.  Mouth/Throat: No oropharyngeal exudate.  Dry mucus membranes  Eyes: Conjunctivae and EOM are normal. Pupils are equal, round, and reactive to light. Right eye exhibits no discharge. Left eye exhibits no discharge.  Neck: Normal range of motion. Neck supple.  Cardiovascular: Normal rate, regular rhythm and normal heart sounds.  Exam reveals no friction rub.   No murmur heard. Pulses:      Radial pulses are 2+ on the right side, and 2+ on the left side.  Pulmonary/Chest: Effort normal and breath sounds normal. No respiratory distress. She has no wheezes. She has no rales.  Abdominal: Soft. Bowel sounds are normal. She exhibits no distension. There is no tenderness. There is no rebound and no guarding.  Diffuse tenderness upon palpation to the abdomen   Musculoskeletal: Normal range of motion.  Neurological: She is alert and oriented to person,  place, and time. No cranial nerve deficit. She exhibits normal muscle tone. Coordination normal.  Patient follows commands well  Patient responds to questions appropriately   Skin: Skin is warm and dry. No rash noted. She is not diaphoretic. No erythema.  Psychiatric: She has a normal mood and affect. Her behavior is normal. Thought content normal.  Nursing note and vitals reviewed.   ED Course  Procedures (including critical care time)  Results for orders placed or performed during the hospital encounter of 02/28/15  CBC with Differential/Platelet  Result Value Ref Range   WBC 4.6 4.0 - 10.5 K/uL   RBC 3.89 3.87 - 5.11 MIL/uL   Hemoglobin 11.3 (L) 12.0 - 15.0 g/dL   HCT 36.3 36.0 - 46.0 %   MCV 93.3 78.0 - 100.0 fL   MCH 29.0 26.0 - 34.0 pg   MCHC 31.1 30.0 - 36.0 g/dL   RDW 13.5 11.5 - 15.5 %   Platelets 227 150 - 400 K/uL   Neutrophils Relative % 77 43 - 77 %   Neutro Abs 3.5 1.7 - 7.7 K/uL   Lymphocytes Relative 20 12 - 46 %   Lymphs Abs 0.9 0.7 - 4.0 K/uL   Monocytes Relative 2 (L) 3 - 12 %   Monocytes Absolute 0.1 0.1 - 1.0 K/uL   Eosinophils Relative 1 0 - 5 %   Eosinophils Absolute 0.0 0.0 - 0.7 K/uL   Basophils Relative 0 0 - 1 %   Basophils Absolute 0.0 0.0 - 0.1 K/uL  Comprehensive metabolic panel  Result Value Ref Range   Sodium 138 135 - 145 mmol/L   Potassium 4.8 3.5 - 5.1 mmol/L   Chloride 102 96 - 112 mmol/L   CO2 26 19 - 32 mmol/L   Glucose, Bld 130 (H) 70 - 99 mg/dL   BUN 39 (H) 6 - 23 mg/dL   Creatinine, Ser 1.11 (H) 0.50 - 1.10 mg/dL   Calcium 9.3 8.4 - 10.5 mg/dL   Total Protein 7.1 6.0 - 8.3 g/dL   Albumin 4.1 3.5 - 5.2 g/dL   AST 27 0 - 37 U/L   ALT 16 0 - 35 U/L   Alkaline Phosphatase 94 39 - 117 U/L   Total  Bilirubin 0.9 0.3 - 1.2 mg/dL   GFR calc non Af Amer 41 (L) >90 mL/min   GFR calc Af Amer 48 (L) >90 mL/min   Anion gap 10 5 - 15  Lipase, blood  Result Value Ref Range   Lipase 15 11 - 59 U/L  Urinalysis, Routine w reflex microscopic   Result Value Ref Range   Color, Urine YELLOW YELLOW   APPearance CLEAR CLEAR   Specific Gravity, Urine 1.017 1.005 - 1.030   pH 7.5 5.0 - 8.0   Glucose, UA NEGATIVE NEGATIVE mg/dL   Hgb urine dipstick NEGATIVE NEGATIVE   Bilirubin Urine NEGATIVE NEGATIVE   Ketones, ur NEGATIVE NEGATIVE mg/dL   Protein, ur NEGATIVE NEGATIVE mg/dL   Urobilinogen, UA 0.2 0.0 - 1.0 mg/dL   Nitrite NEGATIVE NEGATIVE   Leukocytes, UA NEGATIVE NEGATIVE  Troponin I  Result Value Ref Range   Troponin I <0.03 <0.031 ng/mL    Labs Review Labs Reviewed  CBC WITH DIFFERENTIAL/PLATELET - Abnormal; Notable for the following:    Hemoglobin 11.3 (*)    Monocytes Relative 2 (*)    All other components within normal limits  COMPREHENSIVE METABOLIC PANEL - Abnormal; Notable for the following:    Glucose, Bld 130 (*)    BUN 39 (*)    Creatinine, Ser 1.11 (*)    GFR calc non Af Amer 41 (*)    GFR calc Af Amer 48 (*)    All other components within normal limits  URINE CULTURE  LIPASE, BLOOD  URINALYSIS, ROUTINE W REFLEX MICROSCOPIC  TROPONIN I    Imaging Review Ct Abdomen Pelvis Wo Contrast  02/28/2015   CLINICAL DATA:  Mid abdominal pain, nausea, and vomiting, symptoms beginning yesterday, past history of colon surgery for GI bleeding, small bowel obstruction, renal insufficiency, hypertension, polymyalgia rheumatica, diabetes, hiatal hernia  EXAM: CT ABDOMEN AND PELVIS WITHOUT CONTRAST  TECHNIQUE: Multidetector CT imaging of the abdomen and pelvis was performed following the standard protocol without IV contrast. Sagittal and coronal MPR images reconstructed from axial data set. Patient drank a small amount of dilute oral contrast for exam. IV contrast not administered due to renal dysfunction.  COMPARISON:  10/06/2014  FINDINGS: Emphysematous changes at lung bases with minimal atelectasis or scarring.  Large hiatal hernia.  Extensive atherosclerotic calcifications.  Mildly dilated RIGHT renal collecting system  though no definite obstructing calculus is seen.  Small low-attenuation focus at dome RIGHT lobe liver 6 mm diameter previously 5 mm.  Cystic lesion at pancreatic body 27 x 17 mm image 23, increased in signs.  Remainder of liver, gallbladder, spleen, pancreas, kidneys, and adrenal glands otherwise normal appearance for exam lacking IV contrast.  Diverticulosis of sigmoid colon, unable to exclude sigmoid wall thickening.  No definite pericolic inflammatory changes.  Unremarkable bladder, uterus, ureters and adnexa.  Numerous pelvic phleboliths.  No mass, adenopathy, free air or free fluid.  Prior lumbosacral fusion.  IMPRESSION: Increased size of a cystic lesion in the pancreatic body in a 27 x 17 mm in size; followup nonemergent characterization by MR imaging recommended.  Distal colonic diverticulosis without definite evidence of diverticulitis.  Mildly prominent RIGHT renal collecting system without urinary tract calcification or definite obstruction seen.  Large hiatal hernia.   Electronically Signed   By: Lavonia Dana M.D.   On: 02/28/2015 17:43   Dg Chest Port 1 View  02/28/2015   CLINICAL DATA:  81 year old female with abdominal pain with nausea. History diabetes. Initial encounter.  EXAM: PORTABLE  CHEST - 1 VIEW  COMPARISON:  10/08/2014.  FINDINGS: No infiltrate, congestive heart failure or pneumothorax.  Heart slightly enlarged.  Calcified mildly tortuous aorta.  Breast calcifications incidentally noted.  IMPRESSION: Heart size top-normal.  Calcified mildly tortuous aorta.  No infiltrate or congestive heart failure.   Electronically Signed   By: Genia Del M.D.   On: 02/28/2015 16:35   Dg Abd Portable 1v  02/28/2015   CLINICAL DATA:  Epigastric abdominal pain with nausea and vomiting.  EXAM: PORTABLE ABDOMEN - 1 VIEW  COMPARISON:  10/04/2014  FINDINGS: No evidence of acute bowel obstruction or significant ileus. No significant evidence of constipation. No gross signs of free air, abnormal  calcifications or soft tissue abnormalities. Stable appearance of the spine following prior lumbar fusion.  IMPRESSION: No acute findings.   Electronically Signed   By: Aletta Edouard M.D.   On: 02/28/2015 16:37     EKG Interpretation   Date/Time:  Wednesday February 28 2015 14:33:19 EDT Ventricular Rate:  59 PR Interval:  186 QRS Duration: 132 QT Interval:  463 QTC Calculation: 459 R Axis:   -77 Text Interpretation:  Sinus rhythm RBBB and LAFB Baseline wander in  lead(s) II III aVF V3 No significant change since last tracing Confirmed  by ALLEN  MD, ANTHONY ([id number]) on 02/28/2015 4:54:31 PM       6:18 PM This provider spoke with Dr. Keturah Barre. [facility]. Discussed case, labs, imaging, vitals, ED course in great detail. Patient to be admitted for intractable nausea and vomiting.  MDM   Final diagnoses:  Intractable vomiting with nausea, vomiting of unspecified type  Epigastric pain  Pancreatic cyst    Medications  0.9 %  sodium chloride infusion ( Intravenous New Bag/Given 02/28/15 1604)  ondansetron (ZOFRAN-ODT) disintegrating tablet 2 mg (not administered)   Filed Vitals:   02/28/15 1418 02/28/15 1721  BP: 176/83 193/84  Pulse: 61 58  Temp: 97.7 F (36.5 C)   TempSrc: Oral   Resp: 16 17  SpO2: 94% 100%   This provider reviewed the patient's chart. Patient was seen in the ED setting back in October of 2015 for similar symptoms - patient was diagnosed with a SBO and admitted.  EKG noted normal sinus rhythm with a heart rate of 59 bpm with a left anterior fascicular block and right bundle branch block. Troponin negative elevation. CBC negative elevated leukocytosis. Hemoglobin 11.3, hematocrit 36.3 - has improved when compared to 4 months ago when patient's hemoglobin was as low as 9.2. CMP noted elevated BUN of 39 and creatinine of 1.11. Glucose is 130 with negative elevated anion gap. Lipase negative elevation. Urinalysis negative hemoglobin, nitrites,  leukocytes-negative findings of infection. Urine culture in process. Plain film of chest portable noted heart size top normal calcified mildly tortuous aorta with no infiltrate or congestive heart failure signs. Portable abdomen no acute findings. CT abdomen and pelvis without contrast showed increased size of cystic lesion on the pancreatic body measuring approximately 27 x 17 mm in size. Colonic diverticulosis without definite evidence of diverticulitis. Mildly prominent right renal collecting system without urinary tract calcification or obstruction. Large hiatal hernia. Negative findings of pneumonia. Negative acute findings noted on imaging. Negative findings of UTI or pyelonephritis. Negative findings of abdominal perforation, infection, SBO. Patient presenting to the ED with epigastric pain - finding of a cyst of the pancreas noted with negative elevated lipase - negative findings of pancreatitis. Patient to be admitted to the hospital for intractable nausea and  vomiting. Patient to be admitted under the care of [facility]. Discussed plan for admission with patient and granddaughter who agreed to plan of care. Patient stable for transfer.   Jamse Mead, PA-C 02/28/15 1828

## 2015-02-28 NOTE — ED Notes (Signed)
Will update pts vitals when she wakes up

## 2015-02-28 NOTE — ED Notes (Signed)
Pt unable to void at this time. 

## 2015-02-28 NOTE — ED Notes (Signed)
Bed: WA17 Expected date:  Expected time:  Means of arrival:  Comments: 81 y/o F [place]

## 2015-02-28 NOTE — ED Notes (Addendum)
Per EMS-started having abdominal pain with N/V since Monday-states she felt better yesterday-this am symptoms started again-4 mg of Zofran given in route

## 2015-02-28 NOTE — ED Provider Notes (Signed)
Medical screening examination/treatment/procedure(s) were conducted as a shared visit with non-physician practitioner(s) and myself.  I personally evaluated the patient during the encounter.   EKG Interpretation   Date/Time:  Wednesday February 28 2015 14:33:19 EDT Ventricular Rate:  59 PR Interval:  186 QRS Duration: 132 QT Interval:  463 QTC Calculation: 459 R Axis:   -77 Text Interpretation:  Sinus rhythm RBBB and LAFB Baseline wander in  lead(s) II III aVF V3 No significant change since last tracing Confirmed  by Calynn Ferrero  MD, Vernesha Talbot ([id number]) on 02/28/2015 4:54:31 PM     Pt here with abd pain, abd ct neg for acute process, sx unable to be controlled here, will admit to medicine  Lacretia Leigh, MD 02/28/15 [phone]

## 2015-03-01 ENCOUNTER — Inpatient Hospital Stay (HOSPITAL_COMMUNITY): Payer: Medicare Other

## 2015-03-01 DIAGNOSIS — K449 Diaphragmatic hernia without obstruction or gangrene: Secondary | ICD-10-CM

## 2015-03-01 DIAGNOSIS — R1013 Epigastric pain: Secondary | ICD-10-CM

## 2015-03-01 DIAGNOSIS — E039 Hypothyroidism, unspecified: Secondary | ICD-10-CM

## 2015-03-01 DIAGNOSIS — R5381 Other malaise: Secondary | ICD-10-CM | POA: Diagnosis present

## 2015-03-01 DIAGNOSIS — I82409 Acute embolism and thrombosis of unspecified deep veins of unspecified lower extremity: Secondary | ICD-10-CM

## 2015-03-01 DIAGNOSIS — E1165 Type 2 diabetes mellitus with hyperglycemia: Secondary | ICD-10-CM

## 2015-03-01 LAB — GLUCOSE, CAPILLARY
GLUCOSE-CAPILLARY: 105 mg/dL — AB (ref 70–99)
GLUCOSE-CAPILLARY: 69 mg/dL — AB (ref 70–99)
Glucose-Capillary: 152 mg/dL — ABNORMAL HIGH (ref 70–99)
Glucose-Capillary: 136 mg/dL — ABNORMAL HIGH (ref 70–99)

## 2015-03-01 LAB — CBC
HCT: 30.8 % — ABNORMAL LOW (ref 36.0–46.0)
HEMOGLOBIN: 9.9 g/dL — AB (ref 12.0–15.0)
MCH: 29.8 pg (ref 26.0–34.0)
MCHC: 32.1 g/dL (ref 30.0–36.0)
MCV: 92.8 fL (ref 78.0–100.0)
PLATELETS: 216 10*3/uL (ref 150–400)
RBC: 3.32 MIL/uL — ABNORMAL LOW (ref 3.87–5.11)
RDW: 13.4 % (ref 11.5–15.5)
WBC: 4.7 10*3/uL (ref 4.0–10.5)

## 2015-03-01 LAB — URINE CULTURE: Colony Count: >=100000

## 2015-03-01 LAB — BASIC METABOLIC PANEL
Anion gap: 8 (ref 5–15)
BUN: 27 mg/dL — AB (ref 6–23)
CO2: 25 mmol/L (ref 19–32)
Calcium: 8.6 mg/dL (ref 8.4–10.5)
Chloride: 105 mmol/L (ref 96–112)
Creatinine, Ser: 0.91 mg/dL (ref 0.50–1.10)
GFR calc non Af Amer: 53 mL/min — ABNORMAL LOW (ref >90)
GFR, EST AFRICAN AMERICAN: 61 mL/min — AB (ref >90)
Glucose, Bld: 94 mg/dL (ref 70–99)
POTASSIUM: 4.3 mmol/L (ref 3.5–5.1)
SODIUM: 138 mmol/L (ref 135–145)

## 2015-03-01 LAB — TSH: TSH: 3.366 u[IU]/mL (ref 0.350–4.500)

## 2015-03-01 MED ORDER — GLUCERNA SHAKE PO LIQD
237.0000 mL | Freq: Four times a day (QID) | ORAL | Status: DC
  Administered 2015-03-02 – 2015-03-03 (×3): 237 mL via ORAL
  Filled 2015-03-01 (×12): qty 237

## 2015-03-01 MED ORDER — DEXTROSE 50 % IV SOLN
INTRAVENOUS | Status: AC
  Administered 2015-03-01: 25 mL
  Filled 2015-03-01: qty 50

## 2015-03-01 MED ORDER — GADOBENATE DIMEGLUMINE 529 MG/ML IV SOLN
10.0000 mL | Freq: Once | INTRAVENOUS | Status: AC | PRN
  Administered 2015-03-01: 10 mL via INTRAVENOUS

## 2015-03-01 NOTE — Progress Notes (Signed)
Clinical Social Work Department CLINICAL SOCIAL WORK PLACEMENT NOTE 03/01/2015  Patient:  [name], [name]  Account Number:  [id number] Admit date:  02/28/2015  Clinical Social Worker:  Maryln Manuel  Date/time:  03/01/2015 04:45 PM  Clinical Social Work is seeking post-discharge placement for this patient at the following level of care:   SKILLED NURSING   (*CSW will update this form in Epic as items are completed)   03/01/2015  Patient/family provided with [place] Department of Clinical Social Work's list of facilities offering this level of care within the geographic area requested by the patient (or if unable, by the patient's family).  03/01/2015  Patient/family informed of their freedom to choose among providers that offer the needed level of care, that participate in Medicare, Medicaid or managed care program needed by the patient, have an available bed and are willing to accept the patient.  03/01/2015  Patient/family informed of [facility]' ownership interest in [facility], as well as of the fact that they are under no obligation to receive care at this facility.  PASARR submitted to EDS on 03/01/2015 PASARR number received on 03/01/2015  FL2 transmitted to all facilities in geographic area requested by pt/family on  03/01/2015 FL2 transmitted to all facilities within larger geographic area on   Patient informed that his/her managed care company has contracts with or will negotiate with  certain facilities, including the following:     Patient/family informed of bed offers received:   Patient chooses bed at  Physician recommends and patient chooses bed at    Patient to be transferred to  on   Patient to be transferred to facility by  Patient and family notified of transfer on  Name of family member notified:    The following physician request were entered in Epic:   Additional Comments:   Alison Murray, MSW, [place]  Work [phone]

## 2015-03-01 NOTE — Evaluation (Addendum)
Physical Therapy Evaluation Patient Details Name: [name] MRN: [id number] DOB: 1919-07-16 Today's Date: 03/01/2015   History of Present Illness  [name] is an 81 y.o. female the PMH of recurrent bilateral DVT on chronic Xarelto, hypertension, diabetes, overactive bladder, GERD, history of duodenitis and diverticulosis who was admitted 02/28/15 with a chief complaint of a 2 day history of vomiting and abdominal pain. Dx of large hiatal hernia, dehydration.   Clinical Impression  *Pt admitted with above diagnosis. Pt currently with functional limitations due to the deficits listed below (see PT Problem List). Pt ambulated 140' with RW, distance limited by fatigue. She lives alone where she has private aide 3 days/week. Pt agreeable to ST-SNF if she is still limited with mobility when time for acute DC.  Pt will benefit from skilled PT to increase their independence and safety with mobility to allow discharge to the venue listed below.       Follow Up Recommendations SNF    Equipment Recommendations  None recommended by PT    Recommendations for Other Services       Precautions / Restrictions Precautions Precautions: Fall Precaution Comments: pt reports no falls in past 2 years Restrictions Weight Bearing Restrictions: No      Mobility  Bed Mobility Overal bed mobility: Modified Independent             General bed mobility comments: with rail  Transfers Overall transfer level: Needs assistance Equipment used: Rolling walker (2 wheeled) Transfers: Sit to/from Stand Sit to Stand: Min guard  Pt reported dizziness with supine to sit. BP 99/58 sitting, 120/78 in standing. Dizziness resolved after sitting 2 minutes.        General transfer comment: min/guard for safety 2* pt reported feeling very weak  Ambulation/Gait Ambulation/Gait assistance: Min guard Ambulation Distance (Feet): 140 Feet Assistive device: Rolling walker (2 wheeled) Gait  Pattern/deviations: Step-through pattern     General Gait Details: steady, no LOB  Stairs            Wheelchair Mobility    Modified Rankin (Stroke Patients Only)       Balance Overall balance assessment: Modified Independent                                           Pertinent Vitals/Pain Pain Assessment: No/denies pain    Home Living Family/patient expects to be discharged to:: Private residence Living Arrangements: Alone Available Help at Discharge: Personal care attendant;Available PRN/intermittently   Home Access: Stairs to enter Entrance Stairs-Rails: Right;Left;Can reach both Entrance Stairs-Number of Steps: 5 Home Layout: One level Home Equipment: Kasandra Knudsen - quad;Walker - 2 wheels;Walker - 4 wheels;Grab bars - tub/shower;Shower seat;Toilet riser Additional Comments: hired caregivers  MWF for a few hours; can come for longer periods of time if needed    Prior Function Level of Independence: Independent with assistive device(s);Needs assistance   Gait / Transfers Assistance Needed: independent with RW     Comments: cargiver prepares meals, supervises shower.     Hand Dominance   Dominant Hand: Right    Extremity/Trunk Assessment   Upper Extremity Assessment: Overall WFL for tasks assessed           Lower Extremity Assessment: Overall WFL for tasks assessed      Cervical / Trunk Assessment: Normal  Communication   Communication: HOH  Cognition Arousal/Alertness: Awake/alert Behavior During Therapy: [facility]  for tasks assessed/performed Overall Cognitive Status: Within Functional Limits for tasks assessed                      General Comments      Exercises        Assessment/Plan    PT Assessment Patient needs continued PT services  PT Diagnosis Generalized weakness   PT Problem List Decreased activity tolerance;Decreased mobility  PT Treatment Interventions Gait training;Stair training;Therapeutic  activities;Therapeutic exercise   PT Goals (Current goals can be found in the Care Plan section) Acute Rehab PT Goals Patient Stated Goal: to return home PT Goal Formulation: With patient Time For Goal Achievement: 03/15/15 Potential to Achieve Goals: Good    Frequency Min 3X/week   Barriers to discharge        Co-evaluation               End of Session Equipment Utilized During Treatment: Gait belt Activity Tolerance: Patient limited by fatigue Patient left: in chair;with call bell/phone within reach Nurse Communication: Mobility status         Time: 1350-1411 PT Time Calculation (min) (ACUTE ONLY): 21 min   Charges:   PT Evaluation $Initial PT Evaluation Tier I: 1 Procedure     PT G CodesPhilomena Doheny 03/01/2015, 2:19 PM [phone]

## 2015-03-01 NOTE — Progress Notes (Signed)
Progress Note   [name] [id number] DOB: 09-23-1919 DOA: 02/28/2015 PCP: Benito Mccreedy, MD   Brief Narrative:   [name] is an 81 y.o. female the PMH of recurrent bilateral DVT on chronic Xarelto, hypertension, diabetes, overactive bladder, GERD, history of duodenitis and diverticulosis who was admitted 02/28/15 with a chief complaint of a 2 day history of vomiting and abdominal pain. Upon initial evaluation, the patient was afebrile and hemodynamically stable. CT of the abdomen and pelvis showed a large hiatal hernia. There was mild dilation of the right renal collection system without frank obstruction or hydronephrosis. A cystic lesion on the body of the pancreas was slightly larger when compared to previous CTs. LFTs/lipase were unremarkable. WBC was 4.6. Urinalysis was negative for pyuria.  Assessment/Plan:   Principal problem:  Dehydration secondary to recurrent vomiting, probable gastritis in the setting of a large hiatal hernia - Continue supportive care with IV fluids and anti-emetics and PPI therapy. - Currently on a clear liquid diet. - Suspect underlying gastritis in the setting of large hiatus hernia. - A.m. Cortisol pending. -TSH WNL.  Active problems: Pancreatic cyst - Enlarging on CT. Nonemergent MRI recommended, ordered. - Lipase WNL.  Abnormal R-renal collection system - Of unclear clinical significance. - No obstructing lesions on CT. Renal ultrasound shows no evidence of obstruction. No hydronephrosis. - Urinalysis was negative for leukocytes and nitrites.  Recurrent DVT of legs -Continue apixiban.  HTN - Continue to hold lasix and losartan in setting of dehydration. - Continue hydralazine prn SBP>180.  Diabetes mellitus type 2 - Given the patient's age and comorbidities, allow for liberal glycemic control. - Currently being managed with NovoLog insulin sensitive sliding scale. CBG 105. - Hemoglobin A1c  pending.  Hypothyroidism - TSH WNL. - In the setting of vomiting, continue IV Synthroid for now.  Deconditioning - PT evaluation requested.  DVT Prophylaxis - On chronic anticoagulation.  Code Status: Full.  Family Communication: Son lives in town.  Out of town until tomorrow. Disposition Plan: Home when stable. Lives alone.   IV Access:    Peripheral IV   Procedures and diagnostic studies:   Dg Chest Port 1 View 02/28/2015: Heart size top-normal.  Calcified mildly tortuous aorta.  No infiltrate or congestive heart failure.     Dg Abd Portable 1v 02/28/2015:  No acute findings.     Ct Abdomen Pelvis Wo Contrast 02/28/2015: Increased size of a cystic lesion in the pancreatic body in a 27 x 17 mm in size; followup nonemergent characterization by MR imaging recommended.  Distal colonic diverticulosis without definite evidence of diverticulitis.  Mildly prominent RIGHT renal collecting system without urinary tract calcification or definite obstruction seen.  Large hiatal hernia.     US Renal Port 03/01/2015: 1. No evidence of hydronephrosis. Bilateral ureteral jets visualized; no evidence of obstruction. 2. Bilateral extrarenal pelves again noted. Mild right-sided renal pelvicaliectasis remains within normal limits.      Medical Consultants:    None.  Anti-Infectives:    None.  Subjective:   [name] has not had any vomiting since last evening.  Says her abdominal pain has eased off, currently "not as sharp", describes it as dull.  No diarrhea.  Had a normal BM yesterday.  Poor appetite.  Has lost weight.  Objective:    Filed Vitals:   02/28/15 1910 02/28/15 1930 02/28/15 2018 03/01/15 0514  BP: 180/68  182/58 116/49  Pulse: 57 61 61 58  Temp:   97.3 F (  36.3 C) 98 F (36.7 C)  TempSrc:   Oral Oral  Resp: 16 17 16 16   SpO2: 96% 97% 97% 99%   No intake or output data in the 24 hours ending 03/01/15 0745  Exam: Gen:  NAD Cardiovascular:  RRR, II/VI  SEM Respiratory:  Lungs CTAB Gastrointestinal:  Abdomen soft, NT/ND, + BS Extremities:  No C/E/C   Data Reviewed:    Labs: Basic Metabolic Panel:  Recent Labs Lab 02/28/15 1504 03/01/15 0415  NA 138 138  K 4.8 4.3  CL 102 105  CO2 26 25  GLUCOSE 130* 94  BUN 39* 27*  CREATININE 1.11* 0.91  CALCIUM 9.3 8.6   GFR CrCl cannot be calculated (Unknown ideal weight.). Liver Function Tests:  Recent Labs Lab 02/28/15 1504  AST 27  ALT 16  ALKPHOS 94  BILITOT 0.9  PROT 7.1  ALBUMIN 4.1    Recent Labs Lab 02/28/15 1504  LIPASE 15   CBC:  Recent Labs Lab 02/28/15 1504 03/01/15 0415  WBC 4.6 4.7  NEUTROABS 3.5  --   HGB 11.3* 9.9*  HCT 36.3 30.8*  MCV 93.3 92.8  PLT 227 216   Cardiac Enzymes:  Recent Labs Lab 02/28/15 1509  TROPONINI <0.03   CBG:  Recent Labs Lab 03/01/15 0016  GLUCAP 105*   Hgb A1c: No results for input(s): HGBA1C in the last 72 hours.  Thyroid function studies:  Recent Labs  03/01/15 0415  TSH 3.366   Microbiology No results found for this or any previous visit (from the past 240 hour(s)).   Medications:   . apixaban  2.5 mg Oral BID  . insulin aspart  0-9 Units Subcutaneous TID WC  . levothyroxine  37.5 mcg Intravenous Daily  . mirtazapine  15 mg Oral QHS  . pantoprazole (PROTONIX) IV  40 mg Intravenous Q24H  . ropinirole  5 mg Oral QHS   Continuous Infusions: . sodium chloride Stopped (02/28/15 2035)  . sodium chloride 0.9 % 1,000 mL infusion 75 mL/hr at 02/28/15 2036    Time spent: 35 minutes with > 50% of time discussing current diagnostic test results, clinical impression and plan of care.    LOS: 1 day   [name]  [facility] Pager [phone]. If unable to reach me by pager, please call my cell phone at [phone].  *Please refer to [email or web address], password [email or web address] to get updated schedule on who will round on this patient, as hospitalists switch teams weekly. If 7PM-7AM, please contact  night-coverage at [email or web address], password [email or web address] for any overnight needs.  03/01/2015, 7:45 AM

## 2015-03-01 NOTE — Progress Notes (Signed)
CRITICAL VALUE ALERT  Critical value received:  CBG 69  Date of notification:  3/24  Time of notification:  1800  Critical value read back:yes  Nurse who received alert:  Kirkland Hun RN  MD notified (1st page):  Rama  Time of first page:  1835  MD notified (2nd page): N/A  Time of second page: N/A  Responding MD:  N/A

## 2015-03-01 NOTE — Progress Notes (Signed)
INITIAL NUTRITION ASSESSMENT  DOCUMENTATION CODES Per approved criteria  -Severe malnutrition in the context of chronic illness   Pt meets criteria for severe malnutrition in the context of chronic illness as evidenced by severe depletion of fat and muscle mass  INTERVENTION: -Glucerna QID each providing 220 kcals and 10 g protein  NUTRITION DIAGNOSIS: Inadequate oral intake related to decreased appetite as evidenced by pt report and decreased mobility.   Goal: Pt to meet >/= 90% of estimated needs  Monitor:  PO intake, supplement acceptance, labs  Reason for Assessment: MST  81 y.o. female  Admitting Dx: <principal problem not specified>  ASSESSMENT: Pt with DVT on chronic anticoagulation, HTN, DM, GERD, overactive bladder, GERD, duodenitis, diverticulosis presents with abdominal pain, nausea and vomiting beginning on 3/21.   Pt lives at home alone, has a caregiver come 2 or 3 times a week.  Pt states she has never been a big eater.  3 years ago her usual body weight was around 130 lbs but has stayed around 110 lbs for the past year.  Pt says she has a hard time standing up long enough to make her meals so she tries to eat things that are easy to put together.  She reports also drinking 4 Glucerna shakes a day at home.  Pt would like to receive those during her hospital stay.    Nutrition Focused Physical Exam:  Subcutaneous Fat:  Orbital Region: severe depletion Upper Arm Region: severe depletion  Thoracic and Lumbar Region: n/a  Muscle:  Temple Region: severe depletion Clavicle Bone Region: severe depletion Clavicle and Acromion Bone Region: severe depletion  Scapular Bone Region: n/a Dorsal Hand: severe depletion Patellar Region: severe depletion Anterior Thigh Region: severe depletion Posterior Calf Region: severe depletion  Edema: none present   Height: Ht Readings from Last 1 Encounters:  10/02/14 5\' 4"  (1.626 m)    Weight: Wt Readings from Last 1  Encounters:  10/08/14 106 lb 6.4 oz (48.263 kg)    Ideal Body Weight: 120 lbs (55 kg)  % Ideal Body Weight: 88%  Wt Readings from Last 10 Encounters:  10/08/14 106 lb 6.4 oz (48.263 kg)  08/02/14 103 lb (46.72 kg)  05/05/14 107 lb 9.6 oz (48.807 kg)  04/07/14 104 lb (47.174 kg)  04/06/14 110 lb 7.2 oz (50.1 kg)  07/29/13 105 lb (47.628 kg)  07/28/13 105 lb (47.628 kg)  05/27/13 114 lb 13.8 oz (52.1 kg)  03/31/13 127 lb (57.607 kg)  02/22/13 134 lb 0.6 oz (60.8 kg)    Usual Body Weight: 110 lbs  % Usual Body Weight: 96%  BMI:  18.1 (Underweight)   Estimated Nutritional Needs: Kcal: 1650-1850 kcal Protein: 85-100 g protein Fluid: >/= 1.7 L  Skin: intact  Diet Order: Diet full liquid Room service appropriate?: Yes with Assist; Fluid consistency:: Thin  EDUCATION NEEDS: -No education needs identified at this time   Intake/Output Summary (Last 24 hours) at 03/01/15 1337 Last data filed at 03/01/15 0930  Gross per 24 hour  Intake    885 ml  Output      0 ml  Net    885 ml    Last BM: none charted   Labs:   Recent Labs Lab 02/28/15 1504 03/01/15 0415  NA 138 138  K 4.8 4.3  CL 102 105  CO2 26 25  BUN 39* 27*  CREATININE 1.11* 0.91  CALCIUM 9.3 8.6  GLUCOSE 130* 94    CBG (last 3)   Recent Labs  03/01/15 0016 03/01/15 1209  GLUCAP 105* 152*    Scheduled Meds: . apixaban  2.5 mg Oral BID  . insulin aspart  0-9 Units Subcutaneous TID WC  . levothyroxine  37.5 mcg Intravenous Daily  . mirtazapine  15 mg Oral QHS  . pantoprazole (PROTONIX) IV  40 mg Intravenous Q24H  . ropinirole  5 mg Oral QHS    Continuous Infusions: . sodium chloride 125 mL/hr at 03/01/15 1039  . sodium chloride 0.9 % 1,000 mL infusion 75 mL/hr at 02/28/15 2036    Past Medical History  Diagnosis Date  . Cyst and pseudocyst of pancreas   . Unspecified essential hypertension   . Other and unspecified hyperlipidemia   . Polymyalgia rheumatica   . Personal history of  unspecified digestive disease   . Restless legs syndrome (RLS)   . Insomnia, unspecified   . Other malaise and fatigue   . Diverticulosis of colon (without mention of hemorrhage)   . Acute gastritis without mention of hemorrhage   . Duodenitis without mention of hemorrhage   . Diaphragmatic hernia without mention of obstruction or gangrene   . Esophageal reflux   . Stricture and stenosis of esophagus   . OAB (overactive bladder)   . Left leg DVT jan 2013  . Unspecified sleep apnea     hx of sleep apnea, none since weight loss  . Diabetes mellitus   . Anemia   . Thyroid disease     hypothyroidism  . CAP (community acquired pneumonia) 04/03/2014  . Arthritis   . Hx pulmonary embolism   . Hiatal hernia- moderate to large 10/06/2014    Past Surgical History  Procedure Laterality Date  . Oophorectomy  1944    not sure which ovary  . Rotator cuff repair  right     4-5 yrs ago  . Eye surgery      cataracts removed-bilateral eyes  . Back surgery  2009    lower back  . Colon surgery  June 17, 2012    surgery for bleed  . Shoulder open rotator cuff repair  08/04/2012    Procedure: ROTATOR CUFF REPAIR SHOULDER OPEN;  Surgeon: Tobi Bastos, MD;  Location: [facility];  Service: Orthopedics;  Laterality: Left;  with Anchors and Graft  . Reduction mammaplasty Bilateral [place] MS Dietetic Intern Pager Number [phone]

## 2015-03-01 NOTE — Progress Notes (Signed)
Clinical Social Work Department BRIEF PSYCHOSOCIAL ASSESSMENT 03/01/2015  Patient:  [name], [name]     Account Number:  [id number]     Admit date:  02/28/2015  Clinical Social Worker:  Glorious Peach, CLINICAL SOCIAL WORKER  Date/Time:  03/01/2015 04:10 PM  Referred by:  Physician  Date Referred:  03/01/2015 Referred for  SNF Placement   Other Referral:   Interview type:  Patient Other interview type:    PSYCHOSOCIAL DATA Living Status:  ALONE Admitted from facility:   Level of care:   Primary support name:  Gene Mccallum Primary support relationship to patient:  CHILD, ADULT Degree of support available:   Adequate    CURRENT CONCERNS Current Concerns  Post-Acute Placement   Other Concerns:    SOCIAL WORK ASSESSMENT / PLAN CSW received consult stating that pt could benefit from going to SNF for short term rehab. CSW completed FL2, faxed pt out to [facility], and is awaiting bed offers.   Assessment/plan status:  Information/Referral to [employer] Other assessment/ plan:   Information/referral to community resources:   CSW assessed pt, pt is agreeable to SNF and states that she has been to [facility] twice in the past but wishes to go to [facility] [place].    PATIENT'S/FAMILY'S RESPONSE TO PLAN OF CARE: CSW met with pt at bedside, introduced self and explained role. CSW informed pt that PT recommends that she goes to a SNF for short term rehab before returning home. Pt states that she has been to [facility] twice in the past and is agreeable to going to a SNF. Pt wishes to go to [facility] [place] this time. CSW informed pt that we would fax her information over to [facility] and contact SNF to see if they can make a bed offer. Pt hopes [facility] can make an offer but if she has to go back to [facility], she will. CSW will continue to follow pt for further DC planning.       Glorious Peach BSW Intern

## 2015-03-02 ENCOUNTER — Encounter (HOSPITAL_COMMUNITY): Payer: Self-pay | Admitting: Internal Medicine

## 2015-03-02 DIAGNOSIS — N3000 Acute cystitis without hematuria: Secondary | ICD-10-CM

## 2015-03-02 DIAGNOSIS — Q453 Other congenital malformations of pancreas and pancreatic duct: Secondary | ICD-10-CM

## 2015-03-02 DIAGNOSIS — R5381 Other malaise: Secondary | ICD-10-CM

## 2015-03-02 DIAGNOSIS — N390 Urinary tract infection, site not specified: Secondary | ICD-10-CM | POA: Diagnosis present

## 2015-03-02 HISTORY — DX: Other congenital malformations of pancreas and pancreatic duct: Q45.3

## 2015-03-02 LAB — GLUCOSE, CAPILLARY
GLUCOSE-CAPILLARY: 98 mg/dL (ref 70–99)
GLUCOSE-CAPILLARY: 96 mg/dL (ref 70–99)
Glucose-Capillary: 137 mg/dL — ABNORMAL HIGH (ref 70–99)
Glucose-Capillary: 100 mg/dL — ABNORMAL HIGH (ref 70–99)
Glucose-Capillary: 102 mg/dL — ABNORMAL HIGH (ref 70–99)

## 2015-03-02 LAB — HEMOGLOBIN A1C
Hgb A1c MFr Bld: 6.4 % — ABNORMAL HIGH (ref 4.8–5.6)
MEAN PLASMA GLUCOSE: 137 mg/dL

## 2015-03-02 LAB — CORTISOL-AM, BLOOD: Cortisol - AM: 8.6 ug/dL (ref 4.3–22.4)

## 2015-03-02 MED ORDER — BISACODYL 10 MG RE SUPP
10.0000 mg | Freq: Once | RECTAL | Status: AC
  Administered 2015-03-02: 10 mg via RECTAL

## 2015-03-02 MED ORDER — HYOSCYAMINE SULFATE 0.125 MG SL SUBL
0.1250 mg | SUBLINGUAL_TABLET | Freq: Four times a day (QID) | SUBLINGUAL | Status: DC | PRN
  Filled 2015-03-02: qty 1

## 2015-03-02 MED ORDER — POLYETHYLENE GLYCOL 3350 17 G PO PACK
17.0000 g | PACK | Freq: Every day | ORAL | Status: DC
  Administered 2015-03-03: 17 g via ORAL
  Filled 2015-03-02: qty 1

## 2015-03-02 MED ORDER — CEFTRIAXONE SODIUM IN DEXTROSE 20 MG/ML IV SOLN
1.0000 g | Freq: Every day | INTRAVENOUS | Status: DC
  Administered 2015-03-02 (×2): 1 g via INTRAVENOUS
  Filled 2015-03-02 (×3): qty 50

## 2015-03-02 NOTE — Progress Notes (Signed)
Patient ate a cup of grits and some pudding after return from MRI.

## 2015-03-02 NOTE — Progress Notes (Addendum)
"  I am not Ok" stated patient. Patient c/o feeling anxious and upset about being sick.. Patient belching and dry heaving Zofran given. Patient also very anxious and irritable at present. Reassurance given as patient would accept it. Back rub given.

## 2015-03-02 NOTE — Plan of Care (Signed)
Problem: Consults Goal: Skin Care Protocol Initiated - if Braden Score 18 or less If consults are not indicated, leave blank or document N/A  Outcome: Completed/Met Date Met:  03/02/15 Skin care products in use due to incontinence.

## 2015-03-02 NOTE — Progress Notes (Signed)
ANTIBIOTIC CONSULT NOTE - INITIAL  Pharmacy Consult for Rocephin Indication: UTI  Allergies  Allergen Reactions  . Sulfa Antibiotics Nausea And Vomiting  . Penicillins Hives and Rash    Patient Measurements: Weight:  (no scale on bed )   Vital Signs: Temp: 98.4 F (36.9 C) (03/24 2057) Temp Source: Oral (03/24 2057) BP: 117/44 mmHg (03/24 2057) Pulse Rate: 58 (03/24 2057) Intake/Output from previous day: 03/24 0701 - 03/25 0700 In: [id number] [P.O.:630; I.V.:1055] Out: -  Intake/Output from this shift: Total I/O In: 1465 [P.O.:410; I.V.:1055] Out: -   Labs:  Recent Labs  02/28/15 1504 03/01/15 0415  WBC 4.6 4.7  HGB 11.3* 9.9*  PLT 227 216  CREATININE 1.11* 0.91   Estimated Creatinine Clearance: 29.3 mL/min (by C-G formula based on Cr of 0.91). No results for input(s): VANCOTROUGH, VANCOPEAK, VANCORANDOM, GENTTROUGH, GENTPEAK, GENTRANDOM, TOBRATROUGH, TOBRAPEAK, TOBRARND, AMIKACINPEAK, AMIKACINTROU, AMIKACIN in the last 72 hours.   Microbiology: Recent Results (from the past 720 hour(s))  Urine culture     Status: None   Collection Time: 02/28/15  3:47 PM  Result Value Ref Range Status   Specimen Description URINE, CATHETERIZED  Final   Special Requests NONE  Final   Colony Count   Final    >=100,000 COLONIES/ML Performed at [facility]    Culture   Final    VIRIDANS STREPTOCOCCUS Performed at [facility]    Report Status 03/01/2015 FINAL  Final    Medical History: Past Medical History  Diagnosis Date  . Cyst and pseudocyst of pancreas   . Unspecified essential hypertension   . Other and unspecified hyperlipidemia   . Polymyalgia rheumatica   . Personal history of unspecified digestive disease   . Restless legs syndrome (RLS)   . Insomnia, unspecified   . Other malaise and fatigue   . Diverticulosis of colon (without mention of hemorrhage)   . Acute gastritis without mention of hemorrhage   . Duodenitis without mention of  hemorrhage   . Diaphragmatic hernia without mention of obstruction or gangrene   . Esophageal reflux   . Stricture and stenosis of esophagus   . OAB (overactive bladder)   . Left leg DVT jan 2013  . Unspecified sleep apnea     hx of sleep apnea, none since weight loss  . Diabetes mellitus   . Anemia   . Thyroid disease     hypothyroidism  . CAP (community acquired pneumonia) 04/03/2014  . Arthritis   . Hx pulmonary embolism   . Hiatal hernia- moderate to large 10/06/2014    Medications:  Prescriptions prior to admission  Medication Sig Dispense Refill Last Dose  . apixaban (ELIQUIS) 2.5 MG TABS tablet Take 1 tablet (2.5 mg total) by mouth 2 (two) times daily. 60 tablet 0   . furosemide (LASIX) 20 MG tablet Take 20 mg by mouth daily.     Marland Kitchen l-methylfolate-B6-B12 (METANX) 3-35-2 MG TABS Take 1 tablet by mouth daily.     Marland Kitchen levothyroxine (SYNTHROID, LEVOTHROID) 75 MCG tablet Take 75 mcg by mouth daily before breakfast.     . metFORMIN (GLUCOPHAGE) 500 MG tablet Take 1 tablet (500 mg total) by mouth daily with breakfast. (Patient taking differently: Take 500 mg by mouth 2 (two) times daily with a meal. ) 30 tablet 0 09/30/2014 at Unknown time  . mirabegron ER (MYRBETRIQ) 25 MG TB24 tablet Take 25 mg by mouth daily.     . mirtazapine (REMERON) 15 MG tablet Take 15 mg  by mouth at bedtime.     Marland Kitchen oxyCODONE (OXY IR/ROXICODONE) 5 MG immediate release tablet Take 2 tablets (10 mg total) by mouth every 6 (six) hours as needed for moderate pain. 30 tablet 0 09/30/2014 at Unknown time  . pantoprazole (PROTONIX) 40 MG tablet Take 40 mg by mouth daily.     Marland Kitchen acetaminophen (TYLENOL) 325 MG tablet Take 2 tablets (650 mg total) by mouth every 4 (four) hours as needed for mild pain, fever or headache (or Fever >/= 101). 30 tablet 0 unknown at unknown time  . benzonatate (TESSALON) 200 MG capsule Take 1 capsule (200 mg total) by mouth 2 (two) times daily as needed for cough. 20 capsule 0 unknown at unknown  time  . cyanocobalamin (,VITAMIN B-12,) 1000 MCG/ML injection Inject 1,000 mcg into the muscle every 30 (thirty) days.   due now  . diclofenac sodium (VOLTAREN) 1 % GEL Apply 2 g topically every morning.   Past Week at Unknown time  . feeding supplement, GLUCERNA SHAKE, (GLUCERNA SHAKE) LIQD Take 237 mLs by mouth 4 (four) times daily - after meals and at bedtime. 30 Can 0 unknown at unknown time  . guaiFENesin (MUCINEX) 600 MG 12 hr tablet Take 1 tablet (600 mg total) by mouth 2 (two) times daily. 30 tablet 0 unknown at unknown time  . levofloxacin (LEVAQUIN) 500 MG tablet Take 1 tablet (500 mg total) by mouth daily. (Patient not taking: Reported on 02/28/2015) 4 tablet 0 Completed Course at Unknown time  . levothyroxine (SYNTHROID, LEVOTHROID) 25 MCG tablet Take 1 tablet (25 mcg total) by mouth daily before breakfast. (Patient not taking: Reported on 02/28/2015) 30 tablet 0 Not Taking at Unknown time  . losartan (COZAAR) 50 MG tablet Take 50 mg by mouth daily.   Past Week at Unknown time  . ondansetron (ZOFRAN) 4 MG tablet Take 1 tablet (4 mg total) by mouth every 6 (six) hours as needed for nausea. 20 tablet 0 unknown at unknown time  . ropinirole (REQUIP) 5 MG tablet Take 5 mg by mouth at bedtime.   09/30/2014 at Unknown time   Scheduled:  . apixaban  2.5 mg Oral BID  . cefTRIAXone (ROCEPHIN)  IV  1 g Intravenous QHS  . feeding supplement (GLUCERNA SHAKE)  237 mL Oral QID  . insulin aspart  0-9 Units Subcutaneous TID WC  . levothyroxine  37.5 mcg Intravenous Daily  . mirtazapine  15 mg Oral QHS  . pantoprazole (PROTONIX) IV  40 mg Intravenous Q24H  . ropinirole  5 mg Oral QHS   Infusions:  . sodium chloride 125 mL/hr at 03/01/15 1039  . sodium chloride 0.9 % 1,000 mL infusion 75 mL/hr at 03/01/15 2006   Assessment:  66 yoF with PMH of recurrent bilateral DVT on chronic Xarelto, hypertension, diabetes, overactive bladder, GERD, history of duodenitis and diverticulosis who was admitted  02/28/15 with a chief complaint of a 2 day history of vomiting and abdominal pain. Dx of large hiatal hernia, dehydration. Rocephin per Rx for UTI.   Goal of Therapy:  Treat infection  Plan:   Rocephin 1Gm IV q24h  F/u cultures  Dorrene German 03/02/2015,12:58 AM

## 2015-03-02 NOTE — Progress Notes (Signed)
CSW provided SNF bed offers to patient who accepted [facility] CSW confirmed with patient's grandaughter, Margreta Journey (ph#: [phone]). Anticipating possible discharge tomorrow.   If ready over the weekend, please contact weekend CSW, Rollene Fare (ph#: [phone]) to facilitate discharge.   Clinical Social Work Department CLINICAL SOCIAL WORK PLACEMENT NOTE 03/02/2015  Patient:  CLEVE, LLAGUNO  Account Number:  [id number] Admit date:  02/28/2015  Clinical Social Worker:  Maryln Manuel  Date/time:  03/01/2015 04:45 PM  Clinical Social Work is seeking post-discharge placement for this patient at the following level of care:   SKILLED NURSING   (*CSW will update this form in Epic as items are completed)   03/01/2015  Patient/family provided with [place] Department of Clinical Social Work's list of facilities offering this level of care within the geographic area requested by the patient (or if unable, by the patient's family).  03/01/2015  Patient/family informed of their freedom to choose among providers that offer the needed level of care, that participate in Medicare, Medicaid or managed care program needed by the patient, have an available bed and are willing to accept the patient.  03/01/2015  Patient/family informed of [facility]' ownership interest in [facility], as well as of the fact that they are under no obligation to receive care at this facility.  PASARR submitted to EDS on 03/01/2015 PASARR number received on 03/01/2015  FL2 transmitted to all facilities in geographic area requested by pt/family on  03/01/2015 FL2 transmitted to all facilities within larger geographic area on   Patient informed that his/her managed care company has contracts with or will negotiate with  certain facilities, including the following:     Patient/family informed of bed offers received:  03/02/2015 Patient chooses bed at [facility] Physician recommends and patient chooses  bed at    Patient to be transferred to [facility] on   Patient to be transferred to facility by  Patient and family notified of transfer on  Name of family member notified:    The following physician request were entered in Epic:   Additional Comments:    Raynaldo Opitz, [place] Social Worker cell #: [phone]

## 2015-03-02 NOTE — Progress Notes (Signed)
Paged NP Baltazar Najjar about urine cx results.

## 2015-03-02 NOTE — Progress Notes (Signed)
Patient with "severe" pain in abdomen this am. 10/10 per patient. Had been given morphine earlier with relief but now with pain again. Given oxyir/tylenol together. Patient with similar episode yesterday evening relieved by having a bowel movement. Will continue to monitor.

## 2015-03-02 NOTE — Progress Notes (Signed)
Patient requesting a sleeping pill or a pain pill because she feels "miserable". Will give morphine. C/o abd pain and left neck cramping pain.

## 2015-03-02 NOTE — Progress Notes (Signed)
PT Cancellation Note  Patient Details Name: [name] MRN: [id number] DOB: 1919/11/25   Cancelled Treatment:    Reason Eval/Treat Not Completed: Other (comment) Checked on pt this morning and currently and pt reports overall not feeling well.  Pt sleeping on arrival both times.  Pt requests not to mobilize at this time.   Paeton Latouche,KATHrine E 03/02/2015, 2:17 PM Carmelia Bake, PT, DPT 03/02/2015 Pager: [phone]

## 2015-03-02 NOTE — Progress Notes (Signed)
Progress Note   [name] [id number] DOB: Aug 28, 1919 DOA: 02/28/2015 PCP: Benito Mccreedy, MD   Brief Narrative:   [name] is an 81 y.o. female the PMH of recurrent bilateral DVT on chronic Xarelto, hypertension, diabetes, overactive bladder, GERD, history of duodenitis and diverticulosis who was admitted 02/28/15 with a chief complaint of a 2 day history of vomiting and abdominal pain. Upon initial evaluation, the patient was afebrile and hemodynamically stable. CT of the abdomen and pelvis showed a large hiatal hernia. There was mild dilation of the right renal collection system without frank obstruction or hydronephrosis. A cystic lesion on the body of the pancreas was slightly larger when compared to previous CTs. LFTs/lipase were unremarkable. WBC was 4.6. Urinalysis was negative for pyuria.  Assessment/Plan:   Principal problem:  Dehydration secondary to recurrent vomiting, probable gastritis in the setting of a large hiatal hernia - Continue supportive care with IV fluids and anti-emetics and PPI therapy. - Suspect underlying gastritis in the setting of large hiatus hernia. - A.m. Cortisol WNL. -TSH WNL.  Active problems: Viridans streptococcus UTI - Rocephin started 03/02/15.  Pancreatic cyst / pancreas devisum - Enlarging on CT.  - Nonemergent MRI done 03/01/15 and showed 2 cystic lesions, likely pseudocysts, with repeat imaging in one year suggested. - Lipase WNL.  Abnormal R-renal collection system - Of unclear clinical significance. - No obstructing lesions on CT. Renal ultrasound shows no evidence of obstruction. No hydronephrosis.  Recurrent DVT of legs -Continue apixiban.  HTN - Continue to hold lasix and losartan in setting of dehydration. - Continue hydralazine prn SBP>180.  Diabetes mellitus type 2 - Given the patient's age and comorbidities, allow for liberal glycemic control. - Currently being managed with NovoLog insulin sensitive  sliding scale. CBG 105. - Hemoglobin A1c 6.4% indicating good outpatient glycemic control.  Hypothyroidism - TSH WNL. - In the setting of vomiting, continue IV Synthroid for now.  Deconditioning - PT evaluation requested.  DVT Prophylaxis - On chronic anticoagulation.  Code Status: Full.  Family Communication: Son lives in town.   Disposition Plan: SNF when stable. Lives alone.   IV Access:    Peripheral IV   Procedures and diagnostic studies:   Dg Chest Port 1 View 02/28/2015: Heart size top-normal.  Calcified mildly tortuous aorta.  No infiltrate or congestive heart failure.     Dg Abd Portable 1v 02/28/2015:  No acute findings.     Ct Abdomen Pelvis Wo Contrast 02/28/2015: Increased size of a cystic lesion in the pancreatic body in a 27 x 17 mm in size; followup nonemergent characterization by MR imaging recommended.  Distal colonic diverticulosis without definite evidence of diverticulitis.  Mildly prominent RIGHT renal collecting system without urinary tract calcification or definite obstruction seen.  Large hiatal hernia.     US Renal Port 03/01/2015: 1. No evidence of hydronephrosis. Bilateral ureteral jets visualized; no evidence of obstruction. 2. Bilateral extrarenal pelves again noted. Mild right-sided renal pelvicaliectasis remains within normal limits.     Mr Abdomen W Wo Contrast 03/02/2015: 1. Two cystic lesions within the pancreas, as detailed above. The larger, within the pancreatic body, is macro lobulated and multi cystic. These are most likely pseudocysts. Indolent cystic neoplasms could look similar. Per consensus criteria, these warrant followup with cross-sectional imaging at 1 year. This could be performed with pre and post contrast MRI or CT. Given patient age, consideration should be made to whether this is clinically relevant. This recommendation follows ACR consensus guidelines:  Managing Incidental Findings on Abdominal CT: White Paper of the ACR Incidental  Findings Committee. J Am Coll Radiol 2010;7:754-773 2. Moderate hiatal hernia. 3. Pancreatic findings which suggest pancreas divisum. No acute pancreatitis identified. 4.  Possible constipation.     Medical Consultants:    None.  Anti-Infectives:    Rocephin 03/02/15--->  Subjective:   [name] has not had any vomiting but continues to have abdominal pain.  Weak with poor appetite.  No dyspnea or cough.    Objective:    Filed Vitals:   03/01/15 2057 03/02/15 0125 03/02/15 0447 03/02/15 0514  BP: 117/44  182/66 156/62  Pulse: 58  62 62  Temp: 98.4 F (36.9 C)  99.1 F (37.3 C)   TempSrc: Oral  Oral   Resp: 16  20   Height:  5' 2"  (1.575 m)    Weight:  48.081 kg (106 lb)    SpO2: 96%  97%     Intake/Output Summary (Last 24 hours) at 03/02/15 1128 Last data filed at 03/02/15 0515  Gross per 24 hour  Intake   2077 ml  Output      0 ml  Net   2077 ml    Exam: Gen:  NAD Cardiovascular:  RRR, II/VI SEM Respiratory:  Lungs CTAB Gastrointestinal:  Abdomen soft, NT/ND, + BS Extremities:  No C/E/C   Data Reviewed:    Labs: Basic Metabolic Panel:  Recent Labs Lab 02/28/15 1504 03/01/15 0415  NA 138 138  K 4.8 4.3  CL 102 105  CO2 26 25  GLUCOSE 130* 94  BUN 39* 27*  CREATININE 1.11* 0.91  CALCIUM 9.3 8.6   GFR Estimated Creatinine Clearance: 29.3 mL/min (by C-G formula based on Cr of 0.91). Liver Function Tests:  Recent Labs Lab 02/28/15 1504  AST 27  ALT 16  ALKPHOS 94  BILITOT 0.9  PROT 7.1  ALBUMIN 4.1    Recent Labs Lab 02/28/15 1504  LIPASE 15   CBC:  Recent Labs Lab 02/28/15 1504 03/01/15 0415  WBC 4.6 4.7  NEUTROABS 3.5  --   HGB 11.3* 9.9*  HCT 36.3 30.8*  MCV 93.3 92.8  PLT 227 216   Cardiac Enzymes:  Recent Labs Lab 02/28/15 1509  TROPONINI <0.03   CBG:  Recent Labs Lab 03/01/15 1209 03/01/15 1758 03/01/15 1832 03/01/15 2100 03/02/15 0739  GLUCAP 152* 69* 136* 137* 98   Hgb A1c:  Recent Labs   03/01/15 0415  HGBA1C 6.4*    Thyroid function studies:  Recent Labs  03/01/15 0415  TSH 3.366   Microbiology Recent Results (from the past 240 hour(s))  Urine culture     Status: None   Collection Time: 02/28/15  3:47 PM  Result Value Ref Range Status   Specimen Description URINE, CATHETERIZED  Final   Special Requests NONE  Final   Colony Count   Final    >=100,000 COLONIES/ML Performed at [facility]    Culture   Final    VIRIDANS STREPTOCOCCUS Performed at [facility]    Report Status 03/01/2015 FINAL  Final     Medications:   . apixaban  2.5 mg Oral BID  . bisacodyl  10 mg Rectal Once  . cefTRIAXone (ROCEPHIN)  IV  1 g Intravenous QHS  . feeding supplement (GLUCERNA SHAKE)  237 mL Oral QID  . insulin aspart  0-9 Units Subcutaneous TID WC  . levothyroxine  37.5 mcg Intravenous Daily  . mirtazapine  15 mg  Oral QHS  . pantoprazole (PROTONIX) IV  40 mg Intravenous Q24H  . polyethylene glycol  17 g Oral Daily  . ropinirole  5 mg Oral QHS   Continuous Infusions: . sodium chloride 1,000 mL (03/02/15 0126)  . sodium chloride 0.9 % 1,000 mL infusion 75 mL/hr at 03/02/15 0126    Time spent: 35 minutes with > 50% of time discussing current diagnostic test results, clinical impression and plan of care.    LOS: 2 days   RAMA,CHRISTINA  [facility] Pager [phone]. If unable to reach me by pager, please call my cell phone at [phone].  *Please refer to [email or web address], password [email or web address] to get updated schedule on who will round on this patient, as hospitalists switch teams weekly. If 7PM-7AM, please contact night-coverage at [email or web address], password [email or web address] for any overnight needs.  03/02/2015, 11:28 AM

## 2015-03-03 ENCOUNTER — Encounter (HOSPITAL_COMMUNITY): Payer: Self-pay | Admitting: Internal Medicine

## 2015-03-03 DIAGNOSIS — I10 Essential (primary) hypertension: Secondary | ICD-10-CM | POA: Diagnosis not present

## 2015-03-03 DIAGNOSIS — I82403 Acute embolism and thrombosis of unspecified deep veins of lower extremity, bilateral: Secondary | ICD-10-CM | POA: Diagnosis not present

## 2015-03-03 DIAGNOSIS — E119 Type 2 diabetes mellitus without complications: Secondary | ICD-10-CM | POA: Diagnosis not present

## 2015-03-03 DIAGNOSIS — E1165 Type 2 diabetes mellitus with hyperglycemia: Secondary | ICD-10-CM | POA: Diagnosis not present

## 2015-03-03 DIAGNOSIS — G2581 Restless legs syndrome: Secondary | ICD-10-CM | POA: Diagnosis not present

## 2015-03-03 DIAGNOSIS — R195 Other fecal abnormalities: Secondary | ICD-10-CM | POA: Diagnosis not present

## 2015-03-03 DIAGNOSIS — R1013 Epigastric pain: Secondary | ICD-10-CM | POA: Diagnosis not present

## 2015-03-03 DIAGNOSIS — E039 Hypothyroidism, unspecified: Secondary | ICD-10-CM | POA: Diagnosis not present

## 2015-03-03 DIAGNOSIS — E785 Hyperlipidemia, unspecified: Secondary | ICD-10-CM | POA: Diagnosis not present

## 2015-03-03 DIAGNOSIS — M6281 Muscle weakness (generalized): Secondary | ICD-10-CM | POA: Diagnosis not present

## 2015-03-03 DIAGNOSIS — K219 Gastro-esophageal reflux disease without esophagitis: Secondary | ICD-10-CM | POA: Diagnosis not present

## 2015-03-03 DIAGNOSIS — G4700 Insomnia, unspecified: Secondary | ICD-10-CM | POA: Diagnosis not present

## 2015-03-03 DIAGNOSIS — E538 Deficiency of other specified B group vitamins: Secondary | ICD-10-CM | POA: Diagnosis not present

## 2015-03-03 DIAGNOSIS — K862 Cyst of pancreas: Secondary | ICD-10-CM | POA: Diagnosis not present

## 2015-03-03 DIAGNOSIS — E860 Dehydration: Secondary | ICD-10-CM | POA: Diagnosis not present

## 2015-03-03 DIAGNOSIS — R5381 Other malaise: Secondary | ICD-10-CM | POA: Diagnosis not present

## 2015-03-03 DIAGNOSIS — N3281 Overactive bladder: Secondary | ICD-10-CM | POA: Diagnosis not present

## 2015-03-03 DIAGNOSIS — R262 Difficulty in walking, not elsewhere classified: Secondary | ICD-10-CM | POA: Diagnosis not present

## 2015-03-03 DIAGNOSIS — B955 Unspecified streptococcus as the cause of diseases classified elsewhere: Secondary | ICD-10-CM | POA: Diagnosis not present

## 2015-03-03 DIAGNOSIS — R1110 Vomiting, unspecified: Secondary | ICD-10-CM | POA: Diagnosis not present

## 2015-03-03 DIAGNOSIS — I82409 Acute embolism and thrombosis of unspecified deep veins of unspecified lower extremity: Secondary | ICD-10-CM | POA: Diagnosis not present

## 2015-03-03 DIAGNOSIS — K449 Diaphragmatic hernia without obstruction or gangrene: Secondary | ICD-10-CM | POA: Diagnosis not present

## 2015-03-03 DIAGNOSIS — N390 Urinary tract infection, site not specified: Secondary | ICD-10-CM | POA: Diagnosis not present

## 2015-03-03 DIAGNOSIS — N3000 Acute cystitis without hematuria: Secondary | ICD-10-CM | POA: Diagnosis not present

## 2015-03-03 DIAGNOSIS — Z86711 Personal history of pulmonary embolism: Secondary | ICD-10-CM | POA: Diagnosis not present

## 2015-03-03 DIAGNOSIS — Z5189 Encounter for other specified aftercare: Secondary | ICD-10-CM | POA: Diagnosis not present

## 2015-03-03 LAB — GLUCOSE, CAPILLARY
Glucose-Capillary: 86 mg/dL (ref 70–99)
Glucose-Capillary: 92 mg/dL (ref 70–99)

## 2015-03-03 MED ORDER — OXYCODONE HCL 5 MG PO TABS: 10.0000 mg | ORAL_TABLET | Freq: Four times a day (QID) | ORAL | Status: DC | PRN

## 2015-03-03 MED ORDER — HYOSCYAMINE SULFATE 0.125 MG SL SUBL: 0.1250 mg | SUBLINGUAL_TABLET | Freq: Four times a day (QID) | SUBLINGUAL | Status: DC | PRN

## 2015-03-03 MED ORDER — POLYETHYLENE GLYCOL 3350 17 G PO PACK: 17.0000 g | PACK | Freq: Every day | ORAL | Status: DC

## 2015-03-03 MED ORDER — METFORMIN HCL 500 MG PO TABS: 500.0000 mg | ORAL_TABLET | Freq: Two times a day (BID) | ORAL | Status: DC

## 2015-03-03 MED ORDER — CEFUROXIME AXETIL 250 MG PO TABS: ORAL_TABLET | ORAL | Status: AC

## 2015-03-03 NOTE — Progress Notes (Signed)
Report called to Engineer, civil (consulting) at [facility]. Pt. to transfer to [facility] and son to drive her over. Social Work to call son and arrange for transportation.

## 2015-03-03 NOTE — Progress Notes (Signed)
Pt. Leaving with son, Gene Coil. He will transport her to [facility]. Notes given to Gene to give to [facility].

## 2015-03-03 NOTE — Progress Notes (Signed)
Pt. Discharged to [facility]. Left via wheelchair. No respiratory distress noted. Gene Blaize at side with all paperwork for [employer].

## 2015-03-03 NOTE — Discharge Summary (Signed)
Physician Discharge Summary  [name] DXI:[id number] DOB: 06/11/1919 DOA: 02/28/2015  PCP: Benito Mccreedy, MD  Admit date: 02/28/2015 Discharge date: 03/03/2015   Recommendations for Outpatient Follow-Up:   1. The patient is being discharged to a SNF for further rehabilitation. 2. Recommend repeat imaging of pancreas in 1 year, if felt to be appropriate. 3. Monitor bowel habits and treat constipation.   Discharge Diagnosis:   Principal Problem:    Dehydration secondary to recurrent vomiting associated with abdominal pain, probable gastritis in the setting of a large hiatal hernia Active Problems:    Essential hypertension    DVT of leg (deep venous thrombosis)    Hypothyroidism    Type 2 diabetes mellitus    Hiatal hernia- moderate to large    Dehydration    Recurrent vomiting    Pancreatic cyst    Physical deconditioning    UTI (urinary tract infection)    Pancreas divisum   Discharge Condition: Improved.  Diet recommendation: Low sodium, heart healthy.  Carbohydrate-modified.     History of Present Illness:   [name] is an 81 y.o. female the PMH of recurrent bilateral DVT on chronic Xarelto, hypertension, diabetes, overactive bladder, GERD, history of duodenitis and diverticulosis who was admitted 02/28/15 with a chief complaint of a 2 day history of vomiting and abdominal pain. Upon initial evaluation, the patient was afebrile and hemodynamically stable. CT of the abdomen and pelvis showed a large hiatal hernia. There was mild dilation of the right renal collection system without frank obstruction or hydronephrosis. A cystic lesion on the body of the pancreas was slightly larger when compared to previous CTs. LFTs/lipase were unremarkable. WBC was 4.6.  Hospital Course by Problem:   Principal problem:  Dehydration secondary to recurrent vomiting, probable gastritis in the setting of a large hiatal hernia - Treated with supportive care  with IV fluids and anti-emetics and PPI therapy. - Suspect underlying gastritis in the setting of large hiatus hernia. - A.m. Cortisol WNL. -TSH WNL.  Active problems: Viridans streptococcus UTI - Rocephin started 03/02/15.  D/C on 3 additional days of Ceftin.  Pancreatic cyst / pancreas devisum - Enlarging on CT.  - Nonemergent MRI done 03/01/15 and showed 2 cystic lesions, likely pseudocysts, with repeat imaging in one year suggested. - Lipase WNL.  Abnormal R-renal collection system - Of unclear clinical significance. - No obstructing lesions on CT. Renal ultrasound shows no evidence of obstruction. No hydronephrosis.  Recurrent DVT of legs -Continue apixiban.  HTN - Resume lasix and losartan at D/C.  Diabetes mellitus type 2 - Given the patient's age and comorbidities, allow for liberal glycemic control. - Currently being managed with NovoLog insulin sensitive sliding scale. CBG 105. - Hemoglobin A1c 6.4% indicating good outpatient glycemic control. - Resume Metformin at D/C.  Hypothyroidism - TSH WNL. - Continue current dose of synthroid.  Deconditioning - S/P PT evaluation, D/C to SNF for further rehabilitation.    Medical Consultants:    None.   Discharge Exam:   Filed Vitals:   03/03/15 0453  BP: 164/82  Pulse: 71  Temp: 98 F (36.7 C)  Resp: 16   Filed Vitals:   03/02/15 0514 03/02/15 2004 03/02/15 2040 03/03/15 0453  BP: 156/62  168/72 164/82  Pulse: 62 68 62 71  Temp:   98.1 F (36.7 C) 98 F (36.7 C)  TempSrc:   Oral Oral  Resp:   18 16  Height:      Weight:  SpO2:   99% 100%    Gen:  NAD Cardiovascular:  RRR, No M/R/G Respiratory: Lungs CTAB Gastrointestinal: Abdomen soft, NT/ND with normal active bowel sounds. Extremities: No C/E/C   The results of significant diagnostics from this hospitalization (including imaging, microbiology, ancillary and laboratory) are listed below for reference.     Procedures and Diagnostic  Studies:   Dg Chest Port 1 View 02/28/2015: Heart size top-normal. Calcified mildly tortuous aorta. No infiltrate or congestive heart failure.   Dg Abd Portable 1v 02/28/2015: No acute findings.   Ct Abdomen Pelvis Wo Contrast 02/28/2015: Increased size of a cystic lesion in the pancreatic body in a 27 x 17 mm in size; followup nonemergent characterization by MR imaging recommended. Distal colonic diverticulosis without definite evidence of diverticulitis. Mildly prominent RIGHT renal collecting system without urinary tract calcification or definite obstruction seen. Large hiatal hernia.   US Renal Port 03/01/2015: 1. No evidence of hydronephrosis. Bilateral ureteral jets visualized; no evidence of obstruction. 2. Bilateral extrarenal pelves again noted. Mild right-sided renal pelvicaliectasis remains within normal limits.   Mr Abdomen W Wo Contrast 03/02/2015: 1. Two cystic lesions within the pancreas, as detailed above. The larger, within the pancreatic body, is macro lobulated and multi cystic. These are most likely pseudocysts. Indolent cystic neoplasms could look similar. Per consensus criteria, these warrant followup with cross-sectional imaging at 1 year. This could be performed with pre and post contrast MRI or CT. Given patient age, consideration should be made to whether this is clinically relevant. should be made to whether this is clinically relevant. This recommendation follows ACR consensus guidelines: Managing Incidental Findings on Abdominal CT: White Paper of the ACR Incidental Findings Committee. J Am Coll Radiol 2010;7:754-773 2. Moderate hiatal hernia. 3. Pancreatic findings which suggest pancreas divisum. No acute pancreatitis identified. 4. Possible constipation.    Labs:   Basic Metabolic Panel:  Recent Labs Lab 02/28/15 1504 03/01/15 0415  NA 138 138  K 4.8 4.3  CL 102 105  CO2 26 25  GLUCOSE 130* 94  BUN 39* 27*  CREATININE 1.11* 0.91  CALCIUM 9.3 8.6   GFR Estimated Creatinine Clearance: 29.3 mL/min  (by C-G formula based on Cr of 0.91). Liver Function Tests:  Recent Labs Lab 02/28/15 1504  AST 27  ALT 16  ALKPHOS 94  BILITOT 0.9  PROT 7.1  ALBUMIN 4.1    Recent Labs Lab 02/28/15 1504  LIPASE 15   CBC:  Recent Labs Lab 02/28/15 1504 03/01/15 0415  WBC 4.6 4.7  NEUTROABS 3.5  --   HGB 11.3* 9.9*  HCT 36.3 30.8*  MCV 93.3 92.8  PLT 227 216   Cardiac Enzymes:  Recent Labs Lab 02/28/15 1509  TROPONINI <0.03   BNP: Invalid input(s): POCBNP CBG:  Recent Labs Lab 03/02/15 0739 03/02/15 1151 03/02/15 1650 03/02/15 2103 03/03/15 0749  GLUCAP 98 96 100* 102* 86   Hgb A1c  Recent Labs  03/01/15 0415  HGBA1C 6.4*   Thyroid function studies  Recent Labs  03/01/15 0415  TSH 3.366   Microbiology Recent Results (from the past 240 hour(s))  Urine culture     Status: None   Collection Time: 02/28/15  3:47 PM  Result Value Ref Range Status   Specimen Description URINE, CATHETERIZED  Final   Special Requests NONE  Final   Colony Count   Final    >=100,000 COLONIES/ML Performed at [facility]    Culture   Final    VIRIDANS STREPTOCOCCUS Performed at [facility]    Report Status  03/01/2015 FINAL  Final     Discharge Instructions:       Discharge Instructions    Call MD for:  extreme fatigue    Complete by:  As directed      Call MD for:  persistant nausea and vomiting    Complete by:  As directed      Call MD for:  severe uncontrolled pain    Complete by:  As directed      Diet - low sodium heart healthy    Complete by:  As directed      Diet Carb Modified    Complete by:  As directed      Discharge instructions    Complete by:  As directed   You were cared for by Dr. Jacquelynn Cree  (a hospitalist) during your hospital stay. If you have any questions about your discharge medications or the care you received while you were in the hospital after you are discharged, you can call the unit and ask to speak with the  hospitalist on call if the hospitalist that took care of you is not available. Once you are discharged, your primary care physician will handle any further medical issues. Please note that NO REFILLS for any discharge medications will be authorized once you are discharged, as it is imperative that you return to your primary care physician (or establish a relationship with a primary care physician if you do not have one) for your aftercare needs so that they can reassess your need for medications and monitor your lab values.  Any outstanding tests can be reviewed by your PCP at your follow up visit.  It is also important to review any medicine changes with your PCP.  Please bring these d/c instructions with you to your next visit so your physician can review these changes with you.  If you do not have a primary care physician, you can call [phone] for a physician referral.  It is highly recommended that you obtain a PCP for hospital follow up.     Increase activity slowly    Complete by:  As directed      Walk with assistance    Complete by:  As directed      Walker     Complete by:  As directed             Medication List    STOP taking these medications        levofloxacin 500 MG tablet  Commonly known as:  LEVAQUIN      TAKE these medications        acetaminophen 325 MG tablet  Commonly known as:  TYLENOL  Take 2 tablets (650 mg total) by mouth every 4 (four) hours as needed for mild pain, fever or headache (or Fever >/= 101).     apixaban 2.5 MG Tabs tablet  Commonly known as:  ELIQUIS  Take 1 tablet (2.5 mg total) by mouth 2 (two) times daily.     benzonatate 200 MG capsule  Commonly known as:  TESSALON  Take 1 capsule (200 mg total) by mouth 2 (two) times daily as needed for cough.     cefUROXime 250 MG tablet  Commonly known as:  CEFTIN  Take 1 tablet twice a day for 3 more days (through 03/06/15)     cyanocobalamin 1000 MCG/ML injection  Commonly known as:  (VITAMIN  B-12)  Inject 1,000 mcg into the muscle every 30 (thirty) days.  diclofenac sodium 1 % Gel  Commonly known as:  VOLTAREN  Apply 2 g topically every morning.     feeding supplement (GLUCERNA SHAKE) Liqd  Take 237 mLs by mouth 4 (four) times daily - after meals and at bedtime.     furosemide 20 MG tablet  Commonly known as:  LASIX  Take 20 mg by mouth daily.     guaiFENesin 600 MG 12 hr tablet  Commonly known as:  MUCINEX  Take 1 tablet (600 mg total) by mouth 2 (two) times daily.     hyoscyamine 0.125 MG SL tablet  Commonly known as:  LEVSIN SL  Place 1 tablet (0.125 mg total) under the tongue every 6 (six) hours as needed for cramping.     l-methylfolate-B6-B12 3-35-2 MG Tabs  Commonly known as:  METANX  Take 1 tablet by mouth daily.     levothyroxine 75 MCG tablet  Commonly known as:  SYNTHROID, LEVOTHROID  Take 75 mcg by mouth daily before breakfast.     losartan 50 MG tablet  Commonly known as:  COZAAR  Take 50 mg by mouth daily.     metFORMIN 500 MG tablet  Commonly known as:  GLUCOPHAGE  Take 1 tablet (500 mg total) by mouth 2 (two) times daily with a meal.     mirabegron ER 25 MG Tb24 tablet  Commonly known as:  MYRBETRIQ  Take 25 mg by mouth daily.     mirtazapine 15 MG tablet  Commonly known as:  REMERON  Take 15 mg by mouth at bedtime.     ondansetron 4 MG tablet  Commonly known as:  ZOFRAN  Take 1 tablet (4 mg total) by mouth every 6 (six) hours as needed for nausea.     oxyCODONE 5 MG immediate release tablet  Commonly known as:  Oxy IR/ROXICODONE  Take 2 tablets (10 mg total) by mouth every 6 (six) hours as needed for moderate pain.     pantoprazole 40 MG tablet  Commonly known as:  PROTONIX  Take 40 mg by mouth daily.     polyethylene glycol packet  Commonly known as:  MIRALAX / GLYCOLAX  Take 17 g by mouth daily.     ropinirole 5 MG tablet  Commonly known as:  REQUIP  Take 5 mg by mouth at bedtime.          Time coordinating  discharge: 35 minutes.  Signed:  RAMA,CHRISTINA  Pager [phone] [facility] 03/03/2015, 8:26 AM

## 2015-03-03 NOTE — Clinical Social Work Placement (Signed)
Clinical Social Work Department CLINICAL SOCIAL WORK PLACEMENT NOTE 03/03/2015  Patient:  [name], [name]  Account Number:  [id number] Admit date:  02/28/2015  Clinical Social Worker:  Maryln Manuel  Date/time:  03/01/2015 04:45 PM  Clinical Social Work is seeking post-discharge placement for this patient at the following level of care:   SKILLED NURSING   (*CSW will update this form in Epic as items are completed)   03/01/2015  Patient/family provided with [place] Department of Clinical Social Work's list of facilities offering this level of care within the geographic area requested by the patient (or if unable, by the patient's family).  03/01/2015  Patient/family informed of their freedom to choose among providers that offer the needed level of care, that participate in Medicare, Medicaid or managed care program needed by the patient, have an available bed and are willing to accept the patient.  03/01/2015  Patient/family informed of [facility]' ownership interest in [facility], as well as of the fact that they are under no obligation to receive care at this facility.  PASARR submitted to EDS on 03/01/2015 PASARR number received on 03/01/2015  FL2 transmitted to all facilities in geographic area requested by pt/family on  03/01/2015 FL2 transmitted to all facilities within larger geographic area on   Patient informed that his/her managed care company has contracts with or will negotiate with  certain facilities, including the following:     Patient/family informed of bed offers received:  03/02/2015 Patient chooses bed at [facility] Physician recommends and patient chooses bed at    Patient to be transferred to [place] on  03/03/2015 Patient to be transferred to facility by Gene/son Patient and family notified of transfer on 03/03/2015 Name of family member notified:  Gene  The following physician request were entered in Epic:   Additional  Comments:  .Dede Query, [place] Worker - Weekend Coverage cell #: [phone]

## 2015-03-03 NOTE — Progress Notes (Signed)
Patient with c/o mid epigastric pain. Given prn. Feels "tired". Little po intake today. No bowel movement after suppository today. Had refused miralax earlier today.

## 2015-03-03 NOTE — Plan of Care (Signed)
Problem: Phase I Progression Outcomes Goal: OOB as tolerated unless otherwise ordered Outcome: Not Met (add Reason) Refused PT

## 2015-03-06 ENCOUNTER — Non-Acute Institutional Stay (SKILLED_NURSING_FACILITY): Payer: Medicare Other | Admitting: Internal Medicine

## 2015-03-06 ENCOUNTER — Other Ambulatory Visit: Payer: Self-pay | Admitting: *Deleted

## 2015-03-06 DIAGNOSIS — K219 Gastro-esophageal reflux disease without esophagitis: Secondary | ICD-10-CM

## 2015-03-06 DIAGNOSIS — R5381 Other malaise: Secondary | ICD-10-CM | POA: Diagnosis not present

## 2015-03-06 DIAGNOSIS — E1165 Type 2 diabetes mellitus with hyperglycemia: Secondary | ICD-10-CM

## 2015-03-06 DIAGNOSIS — R195 Other fecal abnormalities: Secondary | ICD-10-CM

## 2015-03-06 DIAGNOSIS — K862 Cyst of pancreas: Secondary | ICD-10-CM

## 2015-03-06 DIAGNOSIS — K449 Diaphragmatic hernia without obstruction or gangrene: Secondary | ICD-10-CM

## 2015-03-06 DIAGNOSIS — E538 Deficiency of other specified B group vitamins: Secondary | ICD-10-CM | POA: Diagnosis not present

## 2015-03-06 DIAGNOSIS — I10 Essential (primary) hypertension: Secondary | ICD-10-CM | POA: Diagnosis not present

## 2015-03-06 DIAGNOSIS — N3000 Acute cystitis without hematuria: Secondary | ICD-10-CM

## 2015-03-06 DIAGNOSIS — E039 Hypothyroidism, unspecified: Secondary | ICD-10-CM

## 2015-03-06 DIAGNOSIS — I82409 Acute embolism and thrombosis of unspecified deep veins of unspecified lower extremity: Secondary | ICD-10-CM

## 2015-03-06 MED ORDER — OXYCODONE HCL 5 MG PO TABS: ORAL_TABLET | ORAL | Status: DC

## 2015-03-06 NOTE — Progress Notes (Signed)
Patient ID: [name], female   DOB: 06-02-1919, 81 y.o.   MRN: [id number]     [facility]   PCP: OSEI-BONSU,GEORGE, MD  Code Status: DNR  Allergies  Allergen Reactions  . Sulfa Antibiotics Nausea And Vomiting  . Penicillins Hives and Rash    Chief Complaint  Patient presents with  . New Admit To SNF     HPI:  81 year old patient is here for short term rehabilitation post hospital admission from 02/28/15-03/03/15 with abdominal pain, vomiting and dehydration. She was treated for strept viridans UTI. Her abdominal pain and vomiting was thought to be from her gastritis and large hiatal hernia. She was also diagnosed to have a pancreatic cyst on imaging. LFTS and lipase were unremarkable. She has PMH of recurrent bilateral DVT on chronic Xarelto, hypertension, diabetes, overactive bladder, GERD, history of duodenitis and diverticulosis. She is seen in her room today. She is frail elderly patient in no distress. She feels weak and tired easily. Her appetite is poor but it has been this way for almost 4-5 months. Has loose stools x 2 today. Denies abdominal pain, blood in stool or cramps. Mentions that she was due for vitamin b12 shot last week but missed it as she was in the hospital and would like to get it here.    Review of Systems:  Constitutional: Negative for fever, chills, diaphoresis.  HENT: Negative for headache, congestion Eyes: Negative for eye pain, blurred vision, double vision and discharge.  Respiratory: Negative for cough, shortness of breath and wheezing.   Cardiovascular: Negative for chest pain, palpitations, leg swelling.  Gastrointestinal: Negative for heartburn, nausea, vomiting, abdominal pain Genitourinary: Negative for dysuria Musculoskeletal: Negative for back pain, falls Skin: Negative for itching, rash.  Neurological: Negative for dizziness, tingling, focal weakness Psychiatric/Behavioral: Negative for  depression   Past Medical History  Diagnosis Date  . Cyst and pseudocyst of pancreas   . Unspecified essential hypertension   . Other and unspecified hyperlipidemia   . Polymyalgia rheumatica   . Personal history of unspecified digestive disease   . Restless legs syndrome (RLS)   . Insomnia, unspecified   . Other malaise and fatigue   . Diverticulosis of colon (without mention of hemorrhage)   . Acute gastritis without mention of hemorrhage   . Duodenitis without mention of hemorrhage   . Diaphragmatic hernia without mention of obstruction or gangrene   . Esophageal reflux   . Stricture and stenosis of esophagus   . OAB (overactive bladder)   . Left leg DVT jan 2013  . Unspecified sleep apnea     hx of sleep apnea, none since weight loss  . Diabetes mellitus   . Anemia   . Thyroid disease     hypothyroidism  . CAP (community acquired pneumonia) 04/03/2014  . Arthritis   . Hx pulmonary embolism   . Hiatal hernia- moderate to large 10/06/2014  . Pancreas divisum 03/02/2015  . Pancreatic cyst   . Protein-calorie malnutrition, severe 10/02/2014   Past Surgical History  Procedure Laterality Date  . Oophorectomy  1944    not sure which ovary  . Rotator cuff repair  right     4-5 yrs ago  . Eye surgery      cataracts removed-bilateral eyes  . Back surgery  2009    lower back  . Colon surgery  June 17, 2012    surgery for bleed  . Shoulder open rotator cuff repair  08/04/2012  Procedure: ROTATOR CUFF REPAIR SHOULDER OPEN;  Surgeon: Tobi Bastos, MD;  Location: [facility];  Service: Orthopedics;  Laterality: Left;  with Anchors and Graft  . Reduction mammaplasty Bilateral 1980   Social History:   reports that she has never smoked. She has never used smokeless tobacco. She reports that she does not drink alcohol or use illicit drugs.  Family History  Problem Relation Age of Onset  . Melanoma Daughter     died age 55  . Melanoma Son     Medications: Patient's  Medications  New Prescriptions   No medications on file  Previous Medications   ACETAMINOPHEN (TYLENOL) 325 MG TABLET    Take 2 tablets (650 mg total) by mouth every 4 (four) hours as needed for mild pain, fever or headache (or Fever >/= 101).   APIXABAN (ELIQUIS) 2.5 MG TABS TABLET    Take 1 tablet (2.5 mg total) by mouth 2 (two) times daily.   BENZONATATE (TESSALON) 200 MG CAPSULE    Take 1 capsule (200 mg total) by mouth 2 (two) times daily as needed for cough.   CEFUROXIME (CEFTIN) 250 MG TABLET    Take 1 tablet twice a day for 3 more days (through 03/06/15)   CYANOCOBALAMIN (,VITAMIN B-12,) 1000 MCG/ML INJECTION    Inject 1,000 mcg into the muscle every 30 (thirty) days.   DICLOFENAC SODIUM (VOLTAREN) 1 % GEL    Apply 2 g topically every morning.   FEEDING SUPPLEMENT, GLUCERNA SHAKE, (GLUCERNA SHAKE) LIQD    Take 237 mLs by mouth 4 (four) times daily - after meals and at bedtime.   FUROSEMIDE (LASIX) 20 MG TABLET    Take 20 mg by mouth daily.   GUAIFENESIN (MUCINEX) 600 MG 12 HR TABLET    Take 1 tablet (600 mg total) by mouth 2 (two) times daily.   HYOSCYAMINE (LEVSIN SL) 0.125 MG SL TABLET    Place 1 tablet (0.125 mg total) under the tongue every 6 (six) hours as needed for cramping.   L-METHYLFOLATE-B6-B12 (METANX) 3-35-2 MG TABS    Take 1 tablet by mouth daily.   LEVOTHYROXINE (SYNTHROID, LEVOTHROID) 75 MCG TABLET    Take 75 mcg by mouth daily before breakfast.   LOSARTAN (COZAAR) 50 MG TABLET    Take 50 mg by mouth daily.   METFORMIN (GLUCOPHAGE) 500 MG TABLET    Take 1 tablet (500 mg total) by mouth 2 (two) times daily with a meal.   MIRABEGRON ER (MYRBETRIQ) 25 MG TB24 TABLET    Take 25 mg by mouth daily.   MIRTAZAPINE (REMERON) 15 MG TABLET    Take 15 mg by mouth at bedtime.   ONDANSETRON (ZOFRAN) 4 MG TABLET    Take 1 tablet (4 mg total) by mouth every 6 (six) hours as needed for nausea.   OXYCODONE (OXY IR/ROXICODONE) 5 MG IMMEDIATE RELEASE TABLET    Take 2 tablets (10 mg total) by  mouth every 6 (six) hours as needed for moderate pain.   PANTOPRAZOLE (PROTONIX) 40 MG TABLET    Take 40 mg by mouth daily.   POLYETHYLENE GLYCOL (MIRALAX / GLYCOLAX) PACKET    Take 17 g by mouth daily.   ROPINIROLE (REQUIP) 5 MG TABLET    Take 5 mg by mouth at bedtime.  Modified Medications   No medications on file  Discontinued Medications   No medications on file     Physical Exam: Filed Vitals:   03/06/15 1040  BP: 141/71  Pulse: 83  Temp: 96.8 F (36 C)  Resp: 18  Weight: 104 lb (47.174 kg)  SpO2: 95%    General- elderly female, frail, in no acute distress Head- normocephalic, atraumatic Throat- moist mucus membrane Eyes- no pallor, no icterus Neck- no cervical lymphadenopathy Cardiovascular- normal s1,s2, no murmurs, no leg edema Respiratory- bilateral clear to auscultation, no wheeze, no rhonchi, no crackles, no use of accessory muscles Abdomen- bowel sounds present, soft, non tender Musculoskeletal- able to move all 4 extremities, generalized weakness, arthritis changes in digits Neurological- no focal deficit Skin- warm and dry Psychiatry- alert and oriented to person, place and time, normal mood and affect    Labs reviewed: Basic Metabolic Panel:  Recent Labs  04/03/14 2240  10/09/14 0428 02/28/15 1504 03/01/15 0415  NA 137  < > 141 138 138  K 4.1  < > 4.2 4.8 4.3  CL 99  < > 106 102 105  CO2 26  < > 24 26 25   GLUCOSE 138*  < > 134* 130* 94  BUN 27*  < > 16 39* 27*  CREATININE 1.00  < > 0.94 1.11* 0.91  CALCIUM 8.5  < > 8.5 9.3 8.6  MG 1.6  --   --   --   --   PHOS 3.0  --   --   --   --   < > = values in this interval not displayed. Liver Function Tests:  Recent Labs  10/01/14 1744 10/02/14 0509 02/28/15 1504  AST 28 24 27   ALT 18 13 16   ALKPHOS 92 78 94  BILITOT 0.3 0.3 0.9  PROT 6.7 5.6* 7.1  ALBUMIN 3.5 2.9* 4.1    Recent Labs  10/01/14 1744 10/04/14 0456 02/28/15 1504  LIPASE 22 17 15    No results for input(s): AMMONIA in  the last 8760 hours. CBC:  Recent Labs  04/03/14 2240  10/01/14 1744  10/09/14 0428 02/28/15 1504 03/01/15 0415  WBC 7.2  < > 5.7  < > 6.9 4.6 4.7  NEUTROABS 5.1  --  4.6  --   --  3.5  --   HGB 9.1*  < > 11.0*  < > 9.9* 11.3* 9.9*  HCT 27.2*  < > 33.9*  < > 30.5* 36.3 30.8*  MCV 91.0  < > 91.9  < > 89.4 93.3 92.8  PLT 332  < > 217  < > 209 227 216  < > = values in this interval not displayed. Cardiac Enzymes:  Recent Labs  02/28/15 1509  TROPONINI <0.03   BNP: Invalid input(s): POCBNP CBG:  Recent Labs  03/02/15 2103 03/03/15 0749 03/03/15 1207  GLUCAP 102* 86 92    Radiological Exams: Dg Chest Port 1 View 02/28/2015: Heart size top-normal.  Calcified mildly tortuous aorta.  No infiltrate or congestive heart failure.     Dg Abd Portable 1v 02/28/2015:  No acute findings.      Ct Abdomen Pelvis Wo Contrast 02/28/2015: Increased size of a cystic lesion in the pancreatic body in a 27 x 17 mm in size; followup nonemergent characterization by MR imaging recommended.  Distal colonic diverticulosis without definite evidence of diverticulitis.  Mildly prominent RIGHT renal collecting system without urinary tract calcification or definite obstruction seen.  Large hiatal hernia.     US Renal Port 03/01/2015: 1. No evidence of hydronephrosis. Bilateral ureteral jets visualized; no evidence of obstruction. 2. Bilateral extrarenal pelves again noted. Mild right-sided renal pelvicaliectasis remains within normal limits.  Mr Abdomen W Wo Contrast 03/02/2015: 1. Two cystic lesions within the pancreas, as detailed above. The larger, within the pancreatic body, is macro lobulated and multi cystic. These are most likely pseudocysts. Indolent cystic neoplasms could look similar. Per consensus criteria, these warrant followup with cross-sectional imaging at 1 year. This could be performed with pre and post contrast MRI or CT. Given patient age, consideration should be made to whether this is  clinically relevant. This recommendation follows ACR consensus guidelines: Managing Incidental Findings on Abdominal CT: White Paper of the ACR Incidental Findings Committee. J Am Coll Radiol 2010;7:754-773 2. Moderate hiatal hernia. 3. Pancreatic findings which suggest pancreas divisum. No acute pancreatitis identified. 4.  Possible constipation.      Assessment/Plan  Physical deconditioning Will have her work with physical therapy and occupational therapy team to help with gait training and muscle strengthening exercises.fall precautions. Skin care. Encourage to be out of bed.   b12 deficiency Give b12 1000 mcg im x 1 in facility  UTI Completes her ceftin today. Currently asymptomatic. Monitor clinically, hydration encouraged.   Loose stool Her antibiotics could be contributing to this. Also on miralax daily which could contribute as well, change this to daily prn  Pancreatic cyst Enlargement noted on ct scan, f/u outpatient  Recurrent dvt Stable, continue apixaban  HTN Stable, continue home regimen lasix and losartan  Hypothyroidism Continue levothyroxine 75 mcg daily  DM Monitor cbg, on metformin bid  gerd with hiatal hernia Symptoms under control, continue protonix 40 mg daily   Goals of care: short term rehabilitation   Labs/tests ordered: cbc, bmp  Family/ staff Communication: reviewed care plan with patient and nursing supervisor    Blanchie Serve, MD  [facility] [phone] (Monday-Friday 8 am - 5 pm) [phone] (afterhours)

## 2015-03-29 DIAGNOSIS — Z87440 Personal history of urinary (tract) infections: Secondary | ICD-10-CM | POA: Diagnosis not present

## 2015-03-29 DIAGNOSIS — E119 Type 2 diabetes mellitus without complications: Secondary | ICD-10-CM | POA: Diagnosis not present

## 2015-03-29 DIAGNOSIS — G2581 Restless legs syndrome: Secondary | ICD-10-CM | POA: Diagnosis not present

## 2015-03-29 DIAGNOSIS — I82403 Acute embolism and thrombosis of unspecified deep veins of lower extremity, bilateral: Secondary | ICD-10-CM | POA: Diagnosis not present

## 2015-03-29 DIAGNOSIS — M353 Polymyalgia rheumatica: Secondary | ICD-10-CM | POA: Diagnosis not present

## 2015-03-29 DIAGNOSIS — M6281 Muscle weakness (generalized): Secondary | ICD-10-CM | POA: Diagnosis not present

## 2015-04-02 DIAGNOSIS — G2581 Restless legs syndrome: Secondary | ICD-10-CM | POA: Diagnosis not present

## 2015-04-02 DIAGNOSIS — I82403 Acute embolism and thrombosis of unspecified deep veins of lower extremity, bilateral: Secondary | ICD-10-CM | POA: Diagnosis not present

## 2015-04-02 DIAGNOSIS — E119 Type 2 diabetes mellitus without complications: Secondary | ICD-10-CM | POA: Diagnosis not present

## 2015-04-02 DIAGNOSIS — M6281 Muscle weakness (generalized): Secondary | ICD-10-CM | POA: Diagnosis not present

## 2015-04-02 DIAGNOSIS — Z87440 Personal history of urinary (tract) infections: Secondary | ICD-10-CM | POA: Diagnosis not present

## 2015-04-02 DIAGNOSIS — M353 Polymyalgia rheumatica: Secondary | ICD-10-CM | POA: Diagnosis not present

## 2015-04-03 DIAGNOSIS — M6281 Muscle weakness (generalized): Secondary | ICD-10-CM | POA: Diagnosis not present

## 2015-04-03 DIAGNOSIS — G2581 Restless legs syndrome: Secondary | ICD-10-CM | POA: Diagnosis not present

## 2015-04-03 DIAGNOSIS — I82403 Acute embolism and thrombosis of unspecified deep veins of lower extremity, bilateral: Secondary | ICD-10-CM | POA: Diagnosis not present

## 2015-04-03 DIAGNOSIS — Z87440 Personal history of urinary (tract) infections: Secondary | ICD-10-CM | POA: Diagnosis not present

## 2015-04-03 DIAGNOSIS — E119 Type 2 diabetes mellitus without complications: Secondary | ICD-10-CM | POA: Diagnosis not present

## 2015-04-03 DIAGNOSIS — M353 Polymyalgia rheumatica: Secondary | ICD-10-CM | POA: Diagnosis not present

## 2015-04-04 DIAGNOSIS — G8929 Other chronic pain: Secondary | ICD-10-CM | POA: Diagnosis not present

## 2015-04-04 DIAGNOSIS — E039 Hypothyroidism, unspecified: Secondary | ICD-10-CM | POA: Diagnosis not present

## 2015-04-04 DIAGNOSIS — E1140 Type 2 diabetes mellitus with diabetic neuropathy, unspecified: Secondary | ICD-10-CM | POA: Diagnosis not present

## 2015-04-04 DIAGNOSIS — F329 Major depressive disorder, single episode, unspecified: Secondary | ICD-10-CM | POA: Diagnosis not present

## 2015-04-04 DIAGNOSIS — I10 Essential (primary) hypertension: Secondary | ICD-10-CM | POA: Diagnosis not present

## 2015-04-04 DIAGNOSIS — G2581 Restless legs syndrome: Secondary | ICD-10-CM | POA: Diagnosis not present

## 2015-04-04 DIAGNOSIS — J45909 Unspecified asthma, uncomplicated: Secondary | ICD-10-CM | POA: Diagnosis not present

## 2015-04-04 DIAGNOSIS — K219 Gastro-esophageal reflux disease without esophagitis: Secondary | ICD-10-CM | POA: Diagnosis not present

## 2015-04-04 DIAGNOSIS — E538 Deficiency of other specified B group vitamins: Secondary | ICD-10-CM | POA: Diagnosis not present

## 2015-04-04 DIAGNOSIS — R609 Edema, unspecified: Secondary | ICD-10-CM | POA: Diagnosis not present

## 2015-04-05 DIAGNOSIS — Z87440 Personal history of urinary (tract) infections: Secondary | ICD-10-CM | POA: Diagnosis not present

## 2015-04-05 DIAGNOSIS — G2581 Restless legs syndrome: Secondary | ICD-10-CM | POA: Diagnosis not present

## 2015-04-05 DIAGNOSIS — E119 Type 2 diabetes mellitus without complications: Secondary | ICD-10-CM | POA: Diagnosis not present

## 2015-04-05 DIAGNOSIS — M353 Polymyalgia rheumatica: Secondary | ICD-10-CM | POA: Diagnosis not present

## 2015-04-05 DIAGNOSIS — I82403 Acute embolism and thrombosis of unspecified deep veins of lower extremity, bilateral: Secondary | ICD-10-CM | POA: Diagnosis not present

## 2015-04-05 DIAGNOSIS — M6281 Muscle weakness (generalized): Secondary | ICD-10-CM | POA: Diagnosis not present

## 2015-04-06 DIAGNOSIS — M6281 Muscle weakness (generalized): Secondary | ICD-10-CM | POA: Diagnosis not present

## 2015-04-06 DIAGNOSIS — Z87440 Personal history of urinary (tract) infections: Secondary | ICD-10-CM | POA: Diagnosis not present

## 2015-04-06 DIAGNOSIS — I82403 Acute embolism and thrombosis of unspecified deep veins of lower extremity, bilateral: Secondary | ICD-10-CM | POA: Diagnosis not present

## 2015-04-06 DIAGNOSIS — G2581 Restless legs syndrome: Secondary | ICD-10-CM | POA: Diagnosis not present

## 2015-04-06 DIAGNOSIS — E119 Type 2 diabetes mellitus without complications: Secondary | ICD-10-CM | POA: Diagnosis not present

## 2015-04-06 DIAGNOSIS — M353 Polymyalgia rheumatica: Secondary | ICD-10-CM | POA: Diagnosis not present

## 2015-04-10 DIAGNOSIS — E119 Type 2 diabetes mellitus without complications: Secondary | ICD-10-CM | POA: Diagnosis not present

## 2015-04-10 DIAGNOSIS — Z87440 Personal history of urinary (tract) infections: Secondary | ICD-10-CM | POA: Diagnosis not present

## 2015-04-10 DIAGNOSIS — M353 Polymyalgia rheumatica: Secondary | ICD-10-CM | POA: Diagnosis not present

## 2015-04-10 DIAGNOSIS — G2581 Restless legs syndrome: Secondary | ICD-10-CM | POA: Diagnosis not present

## 2015-04-10 DIAGNOSIS — M6281 Muscle weakness (generalized): Secondary | ICD-10-CM | POA: Diagnosis not present

## 2015-04-10 DIAGNOSIS — I82403 Acute embolism and thrombosis of unspecified deep veins of lower extremity, bilateral: Secondary | ICD-10-CM | POA: Diagnosis not present

## 2015-04-12 DIAGNOSIS — G2581 Restless legs syndrome: Secondary | ICD-10-CM | POA: Diagnosis not present

## 2015-04-12 DIAGNOSIS — M6281 Muscle weakness (generalized): Secondary | ICD-10-CM | POA: Diagnosis not present

## 2015-04-12 DIAGNOSIS — E119 Type 2 diabetes mellitus without complications: Secondary | ICD-10-CM | POA: Diagnosis not present

## 2015-04-12 DIAGNOSIS — M353 Polymyalgia rheumatica: Secondary | ICD-10-CM | POA: Diagnosis not present

## 2015-04-12 DIAGNOSIS — I82403 Acute embolism and thrombosis of unspecified deep veins of lower extremity, bilateral: Secondary | ICD-10-CM | POA: Diagnosis not present

## 2015-04-12 DIAGNOSIS — Z87440 Personal history of urinary (tract) infections: Secondary | ICD-10-CM | POA: Diagnosis not present

## 2015-04-13 DIAGNOSIS — M6281 Muscle weakness (generalized): Secondary | ICD-10-CM | POA: Diagnosis not present

## 2015-04-13 DIAGNOSIS — Z87440 Personal history of urinary (tract) infections: Secondary | ICD-10-CM | POA: Diagnosis not present

## 2015-04-13 DIAGNOSIS — G2581 Restless legs syndrome: Secondary | ICD-10-CM | POA: Diagnosis not present

## 2015-04-13 DIAGNOSIS — E119 Type 2 diabetes mellitus without complications: Secondary | ICD-10-CM | POA: Diagnosis not present

## 2015-04-13 DIAGNOSIS — I82403 Acute embolism and thrombosis of unspecified deep veins of lower extremity, bilateral: Secondary | ICD-10-CM | POA: Diagnosis not present

## 2015-04-13 DIAGNOSIS — M353 Polymyalgia rheumatica: Secondary | ICD-10-CM | POA: Diagnosis not present

## 2015-04-17 DIAGNOSIS — Z87440 Personal history of urinary (tract) infections: Secondary | ICD-10-CM | POA: Diagnosis not present

## 2015-04-17 DIAGNOSIS — M6281 Muscle weakness (generalized): Secondary | ICD-10-CM | POA: Diagnosis not present

## 2015-04-17 DIAGNOSIS — I82403 Acute embolism and thrombosis of unspecified deep veins of lower extremity, bilateral: Secondary | ICD-10-CM | POA: Diagnosis not present

## 2015-04-17 DIAGNOSIS — M353 Polymyalgia rheumatica: Secondary | ICD-10-CM | POA: Diagnosis not present

## 2015-04-17 DIAGNOSIS — G2581 Restless legs syndrome: Secondary | ICD-10-CM | POA: Diagnosis not present

## 2015-04-17 DIAGNOSIS — E119 Type 2 diabetes mellitus without complications: Secondary | ICD-10-CM | POA: Diagnosis not present

## 2015-04-18 DIAGNOSIS — Z87440 Personal history of urinary (tract) infections: Secondary | ICD-10-CM | POA: Diagnosis not present

## 2015-04-18 DIAGNOSIS — G2581 Restless legs syndrome: Secondary | ICD-10-CM | POA: Diagnosis not present

## 2015-04-18 DIAGNOSIS — M6281 Muscle weakness (generalized): Secondary | ICD-10-CM | POA: Diagnosis not present

## 2015-04-18 DIAGNOSIS — M353 Polymyalgia rheumatica: Secondary | ICD-10-CM | POA: Diagnosis not present

## 2015-04-18 DIAGNOSIS — E119 Type 2 diabetes mellitus without complications: Secondary | ICD-10-CM | POA: Diagnosis not present

## 2015-04-18 DIAGNOSIS — I82403 Acute embolism and thrombosis of unspecified deep veins of lower extremity, bilateral: Secondary | ICD-10-CM | POA: Diagnosis not present

## 2015-04-19 DIAGNOSIS — M353 Polymyalgia rheumatica: Secondary | ICD-10-CM | POA: Diagnosis not present

## 2015-04-19 DIAGNOSIS — M6281 Muscle weakness (generalized): Secondary | ICD-10-CM | POA: Diagnosis not present

## 2015-04-19 DIAGNOSIS — E119 Type 2 diabetes mellitus without complications: Secondary | ICD-10-CM | POA: Diagnosis not present

## 2015-04-19 DIAGNOSIS — I82403 Acute embolism and thrombosis of unspecified deep veins of lower extremity, bilateral: Secondary | ICD-10-CM | POA: Diagnosis not present

## 2015-04-19 DIAGNOSIS — G2581 Restless legs syndrome: Secondary | ICD-10-CM | POA: Diagnosis not present

## 2015-04-19 DIAGNOSIS — Z87440 Personal history of urinary (tract) infections: Secondary | ICD-10-CM | POA: Diagnosis not present

## 2015-04-20 DIAGNOSIS — E119 Type 2 diabetes mellitus without complications: Secondary | ICD-10-CM | POA: Diagnosis not present

## 2015-04-20 DIAGNOSIS — M6281 Muscle weakness (generalized): Secondary | ICD-10-CM | POA: Diagnosis not present

## 2015-04-20 DIAGNOSIS — I82403 Acute embolism and thrombosis of unspecified deep veins of lower extremity, bilateral: Secondary | ICD-10-CM | POA: Diagnosis not present

## 2015-04-20 DIAGNOSIS — G2581 Restless legs syndrome: Secondary | ICD-10-CM | POA: Diagnosis not present

## 2015-04-20 DIAGNOSIS — Z87440 Personal history of urinary (tract) infections: Secondary | ICD-10-CM | POA: Diagnosis not present

## 2015-04-20 DIAGNOSIS — M353 Polymyalgia rheumatica: Secondary | ICD-10-CM | POA: Diagnosis not present

## 2015-04-24 ENCOUNTER — Telehealth: Payer: Self-pay | Admitting: Internal Medicine

## 2015-04-24 NOTE — Telephone Encounter (Signed)
Pt states she is having problems with diarrhea and abdominal cramping. Pt scheduled to see Amy Esterwood PA 05/01/15@2pm . Discussed with pt that she can take Imodium OTC for diarrhea and the Levsin that is on her med list for the cramping.

## 2015-04-25 DIAGNOSIS — M353 Polymyalgia rheumatica: Secondary | ICD-10-CM | POA: Diagnosis not present

## 2015-04-25 DIAGNOSIS — M6281 Muscle weakness (generalized): Secondary | ICD-10-CM | POA: Diagnosis not present

## 2015-04-25 DIAGNOSIS — Z87440 Personal history of urinary (tract) infections: Secondary | ICD-10-CM | POA: Diagnosis not present

## 2015-04-25 DIAGNOSIS — G2581 Restless legs syndrome: Secondary | ICD-10-CM | POA: Diagnosis not present

## 2015-04-25 DIAGNOSIS — E119 Type 2 diabetes mellitus without complications: Secondary | ICD-10-CM | POA: Diagnosis not present

## 2015-04-25 DIAGNOSIS — I82403 Acute embolism and thrombosis of unspecified deep veins of lower extremity, bilateral: Secondary | ICD-10-CM | POA: Diagnosis not present

## 2015-05-01 ENCOUNTER — Encounter: Payer: Self-pay | Admitting: Physician Assistant

## 2015-05-01 ENCOUNTER — Other Ambulatory Visit (INDEPENDENT_AMBULATORY_CARE_PROVIDER_SITE_OTHER): Payer: Medicare Other

## 2015-05-01 ENCOUNTER — Ambulatory Visit (INDEPENDENT_AMBULATORY_CARE_PROVIDER_SITE_OTHER): Payer: Medicare Other | Admitting: Physician Assistant

## 2015-05-01 VITALS — BP 118/54 | HR 84 | Ht 62.0 in | Wt 102.2 lb

## 2015-05-01 DIAGNOSIS — R197 Diarrhea, unspecified: Secondary | ICD-10-CM

## 2015-05-01 DIAGNOSIS — R110 Nausea: Secondary | ICD-10-CM

## 2015-05-01 DIAGNOSIS — K219 Gastro-esophageal reflux disease without esophagitis: Secondary | ICD-10-CM

## 2015-05-01 LAB — CBC WITH DIFFERENTIAL/PLATELET
Basophils Absolute: 0 10*3/uL (ref 0.0–0.1)
Basophils Relative: 0.4 % (ref 0.0–3.0)
Eosinophils Absolute: 0.2 10*3/uL (ref 0.0–0.7)
Eosinophils Relative: 3.1 % (ref 0.0–5.0)
HEMATOCRIT: 33.5 % — AB (ref 36.0–46.0)
Hemoglobin: 11.2 g/dL — ABNORMAL LOW (ref 12.0–15.0)
Lymphocytes Relative: 30.1 % (ref 12.0–46.0)
Lymphs Abs: 2 10*3/uL (ref 0.7–4.0)
MCHC: 33.6 g/dL (ref 30.0–36.0)
MCV: 89.2 fl (ref 78.0–100.0)
Monocytes Absolute: 0.5 10*3/uL (ref 0.1–1.0)
Monocytes Relative: 6.8 % (ref 3.0–12.0)
NEUTROS ABS: 4 10*3/uL (ref 1.4–7.7)
NEUTROS PCT: 59.6 % (ref 43.0–77.0)
Platelets: 277 10*3/uL (ref 150.0–400.0)
RBC: 3.75 Mil/uL — AB (ref 3.87–5.11)
RDW: 15.2 % (ref 11.5–15.5)
WBC: 6.7 10*3/uL (ref 4.0–10.5)

## 2015-05-01 LAB — COMPREHENSIVE METABOLIC PANEL
ALT: 13 U/L (ref 0–35)
AST: 22 U/L (ref 0–37)
Albumin: 3.8 g/dL (ref 3.5–5.2)
Alkaline Phosphatase: 92 U/L (ref 39–117)
BUN: 33 mg/dL — ABNORMAL HIGH (ref 6–23)
CALCIUM: 9.4 mg/dL (ref 8.4–10.5)
CO2: 28 meq/L (ref 19–32)
Chloride: 105 mEq/L (ref 96–112)
Creatinine, Ser: 1 mg/dL (ref 0.40–1.20)
GFR: 54.9 mL/min — AB (ref >60.00)
Glucose, Bld: 128 mg/dL — ABNORMAL HIGH (ref 70–99)
Potassium: 4.7 mEq/L (ref 3.5–5.1)
Sodium: 139 mEq/L (ref 135–145)
Total Bilirubin: 0.3 mg/dL (ref 0.2–1.2)
Total Protein: 6.6 g/dL (ref 6.0–8.3)

## 2015-05-01 LAB — LIPASE: Lipase: 28 U/L (ref 11.0–59.0)

## 2015-05-01 MED ORDER — ONDANSETRON HCL 4 MG PO TABS: ORAL_TABLET | ORAL | Status: DC

## 2015-05-01 MED ORDER — DICYCLOMINE HCL 10 MG PO CAPS: ORAL_CAPSULE | ORAL | Status: DC

## 2015-05-01 NOTE — Progress Notes (Signed)
Patient ID: [name], female   DOB: 08/26/1919, 81 y.o.   MRN: [id number]   Subjective:    Patient ID: [name], female    DOB: 05/03/1919, 81 y.o.   MRN: [id number]  HPI [name] is a very nice 81 year old white female known to Dr. Henrene Pastor. Patient has history of diverticular disease, chronic GERD, polymyalgia rheumatica, history of prior DVT PE, adult-onset diabetes mellitus, hypertension, pancreas to be some and pancreatic cysts. She had a recent hospital admission 02/28/2015 with nausea and vomiting. She did not have any specific diagnoses at that time other than dehydration and probable gastritis. She comes in today stating that she's been having problems with diarrhea over the past month, but on careful questioning this is intermittent diarrhea with episodes of incontinence. This is not occurring daily but may occur 2-3 times per week. She says in between these she has normal bowel movements. When she gets these urgent episodes of diarrhea she does not have enough warning to get to the bathroom. She has not noted any melena or hematochezia. She has no complaints of ongoing abdominal pain but has had some ongoing queasiness and frequent nausea without vomiting. She is on Protonix 40 mg daily. She has Zofran at home which she has not been taking. MRI of the abdomen 02/28/2015 showed 2 cystic lesions in the pancreas larger within the pancreatic body is macro lobulated and multicystic most likely pseudocysts was recommended she have follow-up at 1 year him in no acute pancreatitis but findings suggestive of pancreas to be some and a moderately large hiatal hernia. Patient has very remote history of Crohn's disease last colonoscopy was done in 2004 showing left colon diverticulosis and no evidence for inflammatory bowel disease. EGD in 2014 showing esophageal stricture which was not dilated and chronic GERD.  Review of Systems Pertinent positive and negative review of systems were noted in the  above HPI section.  All other review of systems was otherwise negative.  Outpatient Encounter Prescriptions as of 05/01/2015  Medication Sig  . apixaban (ELIQUIS) 2.5 MG TABS tablet Take 1 tablet (2.5 mg total) by mouth 2 (two) times daily.  . benzonatate (TESSALON) 200 MG capsule Take 1 capsule (200 mg total) by mouth 2 (two) times daily as needed for cough.  . cyanocobalamin (,VITAMIN B-12,) 1000 MCG/ML injection Inject 1,000 mcg into the muscle every 30 (thirty) days.  . diclofenac sodium (VOLTAREN) 1 % GEL Apply 2 g topically every morning.  . feeding supplement, GLUCERNA SHAKE, (GLUCERNA SHAKE) LIQD Take 237 mLs by mouth 4 (four) times daily - after meals and at bedtime.  . furosemide (LASIX) 20 MG tablet Take 20 mg by mouth as needed.   . hyoscyamine (LEVSIN SL) 0.125 MG SL tablet Place 1 tablet (0.125 mg total) under the tongue every 6 (six) hours as needed for cramping.  Marland Kitchen l-methylfolate-B6-B12 (METANX) 3-35-2 MG TABS Take 1 tablet by mouth daily.  Marland Kitchen levothyroxine (SYNTHROID, LEVOTHROID) 75 MCG tablet Take 75 mcg by mouth daily before breakfast.  . LITE TOUCH LANCETS MISC daily.   Marland Kitchen losartan (COZAAR) 50 MG tablet Take 50 mg by mouth daily.  . metFORMIN (GLUCOPHAGE) 500 MG tablet Take 1 tablet (500 mg total) by mouth 2 (two) times daily with a meal.  . mirabegron ER (MYRBETRIQ) 25 MG TB24 tablet Take 25 mg by mouth daily.  . mirtazapine (REMERON) 15 MG tablet Take 15 mg by mouth at bedtime.  . ondansetron (ZOFRAN) 4 MG tablet Take 1 tab daily  every morning with breakfast for nausea.  Marland Kitchen oxyCODONE (OXY IR/ROXICODONE) 5 MG immediate release tablet Take 2 tabs= 10 mg by mouth every 6 hours as needed for nausea.  . pantoprazole (PROTONIX) 40 MG tablet Take 40 mg by mouth daily.  Marland Kitchen PHARMACIST CHOICE NO CODING test strip 1 each by Other route as needed.   . polyethylene glycol (MIRALAX / GLYCOLAX) packet Take 17 g by mouth daily.  . ropinirole (REQUIP) 5 MG tablet Take 5 mg by mouth at bedtime.   . [DISCONTINUED] acetaminophen (TYLENOL) 325 MG tablet Take 2 tablets (650 mg total) by mouth every 4 (four) hours as needed for mild pain, fever or headache (or Fever >/= 101).  . [DISCONTINUED] guaiFENesin (MUCINEX) 600 MG 12 hr tablet Take 1 tablet (600 mg total) by mouth 2 (two) times daily.  . [DISCONTINUED] ondansetron (ZOFRAN) 4 MG tablet Take 1 tablet (4 mg total) by mouth every 6 (six) hours as needed for nausea.  Marland Kitchen dicyclomine (BENTYL) 10 MG capsule Take 1 tab twice daily for stomach spasms, cramps.   No facility-administered encounter medications on file as of 05/01/2015.   Allergies  Allergen Reactions  . Sulfa Antibiotics Nausea And Vomiting  . Penicillins Hives and Rash   Patient Active Problem List   Diagnosis Date Noted  . UTI (urinary tract infection) 03/02/2015  . Pancreas divisum 03/02/2015  . Physical deconditioning 03/01/2015  . Recurrent vomiting 02/28/2015  . Pancreatic cyst   . Hiatal hernia- moderate to large 10/06/2014  . Heme + stool 10/04/2014  . Crohn's ileocolitis 10/04/2014  . Abdominal pain, epigastric 10/01/2014  . 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 Arthritis   . Hx pulmonary embolism   . Hypothyroidism 04/11/2014  . Type 2 diabetes mellitus 04/11/2014  . Anemia of chronic disease 04/03/2014  . Overactive bladder 01/07/2012  . DVT of leg (deep venous thrombosis) 01/07/2012  . RESTLESS LEG SYNDROME 03/09/2008  . Essential hypertension 03/09/2008  . ESOPHAGEAL STRICTURE 03/09/2008  . DUODENITIS 03/09/2008  . Diverticulosis of large intestine 03/09/2008  . Polymyalgia rheumatica 03/09/2008  . INSOMNIA 03/09/2008  . SLEEP APNEA 03/09/2008   History   Social History  . Marital Status: Widowed    Spouse Name: N/A  . Number of Children: 2  . Years of Education: N/A   Occupational History  . Retired    Social History Main Topics  . Smoking status: Never Smoker   . Smokeless tobacco: Never Used  . Alcohol Use: No  . Drug Use: No  . Sexual  Activity: No   Other Topics Concern  . Not on file   Social History Narrative    Ms. Huelsmann's family history includes Melanoma in her daughter and son.      Objective:    Filed Vitals:   05/01/15 1355  BP: 118/54  Pulse: 84    Physical Exam  well-developed frail appearing elderly white female in no acute distress, blood pressure 118/54 pulse 84 height 5 foot 2 weight 102 HEENT; nontraumatic normocephalic EOMI PERRLA sclera anicteric, Supple; no JVD, Cardiovascular; regular rate and rhythm with S1-S2 no murmur or gallop, Pulmonary; clear bilaterally  Abdomen ;soft no focal tenderness no guarding or rebound no palpable mass or hepatosplenomegaly bowel sounds are present, Rectal; exam not done, Extremities ;no clubbing cyanosis or edema skin warm and dry, Psych; mood and affect appropriate       Assessment & Plan:   #1 81 yo female with intermittent episodes of urgent diarrhea x one month #2  nausea/intermittent #3 pancreas divisum #4 pancreatic cystic lesions on MRI-pseudocysts vs indolent cystic neoplasms  02/2015-one year F/U recommended #5 GERD  #6 hx DVT/PE #7AODM #8 diverticulosis #9 atrial myxoma #10 remote hx IBD  Plan; Continue protonix 40 mg po daily in am  Add bentyl 10 mg BID Add Zofran 4 mg  qam Check cbc, cmet ,lipase Office follow up one month Dr. Henrene Pastor  Plan;     Cammi Consalvo S Kentrell Guettler PA-C 05/01/2015   Cc: Benito Mccreedy, MD

## 2015-05-01 NOTE — Patient Instructions (Addendum)
Please go to the basement level to have your labs drawn.   Continue Protonix 40 mg, take 1 tab daily in the morning. We sent prescriptions to [facility].  1. Zofran 4 mg. 2. Bentyl 10 mg  We made you a follow up appointment with Dr. Scarlette Shorts on 06-06-2015 at 10:45 am.

## 2015-05-01 NOTE — Progress Notes (Signed)
God bless [name]! Thanks for seeing her

## 2015-05-04 DIAGNOSIS — I82403 Acute embolism and thrombosis of unspecified deep veins of lower extremity, bilateral: Secondary | ICD-10-CM | POA: Diagnosis not present

## 2015-05-04 DIAGNOSIS — M353 Polymyalgia rheumatica: Secondary | ICD-10-CM | POA: Diagnosis not present

## 2015-05-04 DIAGNOSIS — M6281 Muscle weakness (generalized): Secondary | ICD-10-CM | POA: Diagnosis not present

## 2015-05-04 DIAGNOSIS — E119 Type 2 diabetes mellitus without complications: Secondary | ICD-10-CM | POA: Diagnosis not present

## 2015-05-04 DIAGNOSIS — Z87440 Personal history of urinary (tract) infections: Secondary | ICD-10-CM | POA: Diagnosis not present

## 2015-05-04 DIAGNOSIS — G2581 Restless legs syndrome: Secondary | ICD-10-CM | POA: Diagnosis not present

## 2015-05-15 DIAGNOSIS — R000 Tachycardia, unspecified: Secondary | ICD-10-CM | POA: Diagnosis not present

## 2015-05-15 DIAGNOSIS — I340 Nonrheumatic mitral (valve) insufficiency: Secondary | ICD-10-CM | POA: Diagnosis not present

## 2015-05-15 DIAGNOSIS — R5381 Other malaise: Secondary | ICD-10-CM | POA: Diagnosis not present

## 2015-05-15 DIAGNOSIS — R5383 Other fatigue: Secondary | ICD-10-CM | POA: Diagnosis not present

## 2015-06-01 DIAGNOSIS — I10 Essential (primary) hypertension: Secondary | ICD-10-CM | POA: Diagnosis not present

## 2015-06-01 DIAGNOSIS — E1140 Type 2 diabetes mellitus with diabetic neuropathy, unspecified: Secondary | ICD-10-CM | POA: Diagnosis not present

## 2015-06-01 DIAGNOSIS — F329 Major depressive disorder, single episode, unspecified: Secondary | ICD-10-CM | POA: Diagnosis not present

## 2015-06-01 DIAGNOSIS — G2581 Restless legs syndrome: Secondary | ICD-10-CM | POA: Diagnosis not present

## 2015-06-01 DIAGNOSIS — G8929 Other chronic pain: Secondary | ICD-10-CM | POA: Diagnosis not present

## 2015-06-01 DIAGNOSIS — R609 Edema, unspecified: Secondary | ICD-10-CM | POA: Diagnosis not present

## 2015-06-01 DIAGNOSIS — E538 Deficiency of other specified B group vitamins: Secondary | ICD-10-CM | POA: Diagnosis not present

## 2015-06-01 DIAGNOSIS — E039 Hypothyroidism, unspecified: Secondary | ICD-10-CM | POA: Diagnosis not present

## 2015-06-05 DIAGNOSIS — I340 Nonrheumatic mitral (valve) insufficiency: Secondary | ICD-10-CM | POA: Diagnosis not present

## 2015-06-05 DIAGNOSIS — D151 Benign neoplasm of heart: Secondary | ICD-10-CM | POA: Diagnosis not present

## 2015-06-06 ENCOUNTER — Ambulatory Visit (INDEPENDENT_AMBULATORY_CARE_PROVIDER_SITE_OTHER): Payer: Medicare Other | Admitting: Internal Medicine

## 2015-06-06 ENCOUNTER — Encounter: Payer: Self-pay | Admitting: Internal Medicine

## 2015-06-06 VITALS — BP 130/78 | HR 64

## 2015-06-06 DIAGNOSIS — R1084 Generalized abdominal pain: Secondary | ICD-10-CM

## 2015-06-06 DIAGNOSIS — R197 Diarrhea, unspecified: Secondary | ICD-10-CM | POA: Diagnosis not present

## 2015-06-06 DIAGNOSIS — Z7901 Long term (current) use of anticoagulants: Secondary | ICD-10-CM | POA: Diagnosis not present

## 2015-06-06 DIAGNOSIS — R198 Other specified symptoms and signs involving the digestive system and abdomen: Secondary | ICD-10-CM

## 2015-06-06 DIAGNOSIS — K6289 Other specified diseases of anus and rectum: Secondary | ICD-10-CM

## 2015-06-06 NOTE — Patient Instructions (Addendum)
You will be contacted regarding admission to the hospital for a colonoscopy  Do not take your elequis 7/4 or 7/5

## 2015-06-06 NOTE — Progress Notes (Signed)
HISTORY OF PRESENT ILLNESS:  [name] is a 81 y.o. female with multiple multiple significant medical problems as listed below. GI problems have included but are not limited to GERD complicated by peptic stricture, large hiatal hernia, probable Crohn's disease, chronic abdominal complaints, and ill-defined cystic disease of the pancreas. She also has recurrent DVT and has been on long-term anticoagulation. Patient presents today with her caregiver, [name]. She was seen last month by the physician assistant for nausea, abdominal pain, and diarrhea. She presents today for scheduled follow-up. She continues with chronic abdominal pain. Chief complaint today is ongoing diarrhea for at least 6 weeks. There is associated incontinence. He described a formed bowel movement this morning followed by loose stools. No bleeding reported. She continues to lose weight. She states she feels fatigued and weak area did blood work last month was unremarkable. I believe her last colonoscopy was around 2004  REVIEW OF SYSTEMS:  All non-GI ROS negative except for arthritis, cough, depression, fatigue, hearing problems, heart murmur, itching, shortness of breath, sleeping problems, ankle edema, increased urination, urinary leakage, voice change (hoarseness), depression  Past Medical History  Diagnosis Date  . Cyst and pseudocyst of pancreas   . Unspecified essential hypertension   . Other and unspecified hyperlipidemia   . Polymyalgia rheumatica   . Personal history of unspecified digestive disease   . Restless legs syndrome (RLS)   . Insomnia, unspecified   . Other malaise and fatigue   . Diverticulosis of colon (without mention of hemorrhage)   . Acute gastritis without mention of hemorrhage   . Duodenitis without mention of hemorrhage   . Diaphragmatic hernia without mention of obstruction or gangrene   . Esophageal reflux   . Stricture and stenosis of esophagus   . OAB (overactive bladder)   . Left leg  DVT jan 2013  . Unspecified sleep apnea     hx of sleep apnea, none since weight loss  . Diabetes mellitus   . Anemia   . Thyroid disease     hypothyroidism  . CAP (community acquired pneumonia) 04/03/2014  . Arthritis   . Hx pulmonary embolism   . Hiatal hernia- moderate to large 10/06/2014  . Pancreas divisum 03/02/2015  . Pancreatic cyst   . Protein-calorie malnutrition, severe 10/02/2014  . Crohn disease   . Shingles     Past Surgical History  Procedure Laterality Date  . Oophorectomy  1944    not sure which ovary  . Rotator cuff repair  right     4-5 yrs ago  . Eye surgery      cataracts removed-bilateral eyes  . Back surgery  2009    lower back  . Colon surgery  June 17, 2012    surgery for bleed  . Shoulder open rotator cuff repair  08/04/2012    Procedure: ROTATOR CUFF REPAIR SHOULDER OPEN;  Surgeon: Tobi Bastos, MD;  Location: [facility];  Service: Orthopedics;  Laterality: Left;  with Anchors and Graft  . Reduction mammaplasty Bilateral 1980    Social History GERLEAN GILLON  reports that she has never smoked. She has never used smokeless tobacco. She reports that she does not drink alcohol or use illicit drugs.  family history includes Melanoma in her daughter and son.  Allergies  Allergen Reactions  . Sulfa Antibiotics Nausea And Vomiting  . Penicillins Hives and Rash       PHYSICAL EXAMINATION: Vital signs: BP 130/78 mmHg  Pulse 64  Ht  Wt   Constitutional: Frail thin elderly female, no acute distress Psychiatric: alert and oriented x3, cooperative Eyes: extraocular movements intact, anicteric, conjunctiva pink Mouth: oral pharynx moist, no lesions Neck: supple no lymphadenopathy Cardiovascular: heart regular rate and rhythm, no murmur Lungs: clear to auscultation bilaterally Abdomen: soft, tenderness in the upper abdomen with palpation, nondistended, no obvious ascites, no peritoneal signs, normal bowel sounds, no organomegaly Rectal: Appears  to have a villous mass in the rectum at the extent of the examining finger. There is tenderness with palpation. Stool Hemoccult-negative Extremities: Trace lower extremity edema bilaterally Skin: no lesions on visible extremities Neuro: No focal deficits.   ASSESSMENT:  #1. Apparent rectal mass on physical examination. May have overflow diarrhea if this is carcinoma. Needs evaluated endoscopically. No mass noted on recent imaging modalities #2. Multiple significant medical problems  PLAN:  #1. Colonoscopy. The patient is extremely high risk given her age and comorbidities. The procedure will need to be performed at the hospital. She will need to be admitted the day prior to the procedure to assist with her bowel preparation. My 89 assistant will contact the hospitalist to assist [place] in this process.The nature of the procedure, as well as the risks, benefits, and alternatives were carefully and thoroughly reviewed with the patient. Ample time for discussion and questions allowed. The patient understood, was satisfied, and agreed to proceed. #2. Hold Eliquis 2 days prior to the procedure

## 2015-06-08 ENCOUNTER — Telehealth: Payer: Self-pay | Admitting: Internal Medicine

## 2015-06-08 ENCOUNTER — Other Ambulatory Visit: Payer: Self-pay | Admitting: Physician Assistant

## 2015-06-08 NOTE — Telephone Encounter (Signed)
Spoke with patient and told her that her procedure was scheduled for Wednesday morning and that she would hear from the hospital Tuesday morning.  At that point she would be instructed when to come in and where to go.  I told her she would be staying overnight in the hospital so they could help her prep for the procedure.  Patient understood

## 2015-06-12 ENCOUNTER — Observation Stay (HOSPITAL_COMMUNITY)
Admission: RE | Admit: 2015-06-12 | Discharge: 2015-06-14 | Disposition: A | Discharge status: Home / Self Care | Payer: Medicare Other | Source: Ambulatory Visit | Attending: Internal Medicine | Admitting: Internal Medicine

## 2015-06-12 ENCOUNTER — Encounter (HOSPITAL_COMMUNITY): Payer: Self-pay | Admitting: General Practice

## 2015-06-12 DIAGNOSIS — R531 Weakness: Secondary | ICD-10-CM | POA: Diagnosis not present

## 2015-06-12 DIAGNOSIS — K5730 Diverticulosis of large intestine without perforation or abscess without bleeding: Secondary | ICD-10-CM | POA: Diagnosis not present

## 2015-06-12 DIAGNOSIS — R1013 Epigastric pain: Secondary | ICD-10-CM | POA: Diagnosis present

## 2015-06-12 DIAGNOSIS — E785 Hyperlipidemia, unspecified: Secondary | ICD-10-CM | POA: Insufficient documentation

## 2015-06-12 DIAGNOSIS — I4891 Unspecified atrial fibrillation: Secondary | ICD-10-CM | POA: Diagnosis not present

## 2015-06-12 DIAGNOSIS — R109 Unspecified abdominal pain: Secondary | ICD-10-CM | POA: Diagnosis present

## 2015-06-12 DIAGNOSIS — R197 Diarrhea, unspecified: Secondary | ICD-10-CM | POA: Diagnosis not present

## 2015-06-12 DIAGNOSIS — N183 Chronic kidney disease, stage 3 unspecified: Secondary | ICD-10-CM

## 2015-06-12 DIAGNOSIS — Z79899 Other long term (current) drug therapy: Secondary | ICD-10-CM | POA: Insufficient documentation

## 2015-06-12 DIAGNOSIS — G8929 Other chronic pain: Secondary | ICD-10-CM | POA: Diagnosis not present

## 2015-06-12 DIAGNOSIS — Z882 Allergy status to sulfonamides status: Secondary | ICD-10-CM | POA: Diagnosis not present

## 2015-06-12 DIAGNOSIS — E039 Hypothyroidism, unspecified: Secondary | ICD-10-CM | POA: Diagnosis not present

## 2015-06-12 DIAGNOSIS — G2581 Restless legs syndrome: Secondary | ICD-10-CM | POA: Diagnosis not present

## 2015-06-12 DIAGNOSIS — G4700 Insomnia, unspecified: Secondary | ICD-10-CM | POA: Insufficient documentation

## 2015-06-12 DIAGNOSIS — M353 Polymyalgia rheumatica: Secondary | ICD-10-CM | POA: Diagnosis not present

## 2015-06-12 DIAGNOSIS — Z880 Allergy status to penicillin: Secondary | ICD-10-CM | POA: Insufficient documentation

## 2015-06-12 DIAGNOSIS — F419 Anxiety disorder, unspecified: Secondary | ICD-10-CM | POA: Diagnosis not present

## 2015-06-12 DIAGNOSIS — F329 Major depressive disorder, single episode, unspecified: Secondary | ICD-10-CM | POA: Insufficient documentation

## 2015-06-12 DIAGNOSIS — E119 Type 2 diabetes mellitus without complications: Secondary | ICD-10-CM | POA: Diagnosis not present

## 2015-06-12 DIAGNOSIS — K449 Diaphragmatic hernia without obstruction or gangrene: Secondary | ICD-10-CM | POA: Insufficient documentation

## 2015-06-12 DIAGNOSIS — K219 Gastro-esophageal reflux disease without esophagitis: Secondary | ICD-10-CM | POA: Diagnosis not present

## 2015-06-12 DIAGNOSIS — I10 Essential (primary) hypertension: Secondary | ICD-10-CM | POA: Diagnosis present

## 2015-06-12 DIAGNOSIS — N3281 Overactive bladder: Secondary | ICD-10-CM | POA: Insufficient documentation

## 2015-06-12 DIAGNOSIS — Z66 Do not resuscitate: Secondary | ICD-10-CM | POA: Insufficient documentation

## 2015-06-12 DIAGNOSIS — R198 Other specified symptoms and signs involving the digestive system and abdomen: Secondary | ICD-10-CM | POA: Diagnosis not present

## 2015-06-12 DIAGNOSIS — D638 Anemia in other chronic diseases classified elsewhere: Secondary | ICD-10-CM | POA: Diagnosis present

## 2015-06-12 DIAGNOSIS — Z86718 Personal history of other venous thrombosis and embolism: Secondary | ICD-10-CM | POA: Insufficient documentation

## 2015-06-12 DIAGNOSIS — R1032 Left lower quadrant pain: Secondary | ICD-10-CM

## 2015-06-12 DIAGNOSIS — K863 Pseudocyst of pancreas: Secondary | ICD-10-CM | POA: Insufficient documentation

## 2015-06-12 DIAGNOSIS — E43 Unspecified severe protein-calorie malnutrition: Secondary | ICD-10-CM | POA: Diagnosis not present

## 2015-06-12 DIAGNOSIS — K6289 Other specified diseases of anus and rectum: Secondary | ICD-10-CM | POA: Insufficient documentation

## 2015-06-12 DIAGNOSIS — Z86711 Personal history of pulmonary embolism: Secondary | ICD-10-CM | POA: Diagnosis present

## 2015-06-12 DIAGNOSIS — E1122 Type 2 diabetes mellitus with diabetic chronic kidney disease: Secondary | ICD-10-CM

## 2015-06-12 HISTORY — DX: Acute embolism and thrombosis of unspecified deep veins of unspecified lower extremity: I82.409

## 2015-06-12 HISTORY — DX: Type 2 diabetes mellitus without complications: E11.9

## 2015-06-12 HISTORY — DX: Hypothyroidism, unspecified: E03.9

## 2015-06-12 HISTORY — DX: Anxiety disorder, unspecified: F41.9

## 2015-06-12 HISTORY — DX: Depression, unspecified: F32.A

## 2015-06-12 HISTORY — DX: Cardiac arrhythmia, unspecified: I49.9

## 2015-06-12 HISTORY — DX: Frequency of micturition: R35.0

## 2015-06-12 HISTORY — DX: Migraine, unspecified, not intractable, without status migrainosus: G43.909

## 2015-06-12 HISTORY — DX: Major depressive disorder, single episode, unspecified: F32.9

## 2015-06-12 HISTORY — DX: Unspecified atrial fibrillation: I48.91

## 2015-06-12 HISTORY — DX: Urgency of urination: R39.15

## 2015-06-12 LAB — COMPREHENSIVE METABOLIC PANEL
ALT: 10 U/L — ABNORMAL LOW (ref 14–54)
AST: 26 U/L (ref 15–41)
Albumin: 2.9 g/dL — ABNORMAL LOW (ref 3.5–5.0)
Alkaline Phosphatase: 77 U/L (ref 38–126)
Anion gap: 7 (ref 5–15)
BUN: 29 mg/dL — ABNORMAL HIGH (ref 6–20)
CO2: 26 mmol/L (ref 22–32)
Calcium: 9.1 mg/dL (ref 8.9–10.3)
Chloride: 106 mmol/L (ref 101–111)
Creatinine, Ser: 0.88 mg/dL (ref 0.44–1.00)
GFR calc Af Amer: >60 mL/min (ref >60)
GFR calc non Af Amer: 55 mL/min — ABNORMAL LOW (ref >60)
Glucose, Bld: 82 mg/dL (ref 65–99)
Potassium: 5.3 mmol/L — ABNORMAL HIGH (ref 3.5–5.1)
Sodium: 139 mmol/L (ref 135–145)
Total Bilirubin: 0.4 mg/dL (ref 0.3–1.2)
Total Protein: 5.8 g/dL — ABNORMAL LOW (ref 6.5–8.1)

## 2015-06-12 LAB — CBC
HCT: 32.9 % — ABNORMAL LOW (ref 36.0–46.0)
Hemoglobin: 10.2 g/dL — ABNORMAL LOW (ref 12.0–15.0)
MCH: 28.4 pg (ref 26.0–34.0)
MCHC: 31 g/dL (ref 30.0–36.0)
MCV: 91.6 fL (ref 78.0–100.0)
Platelets: 218 10*3/uL (ref 150–400)
RBC: 3.59 MIL/uL — ABNORMAL LOW (ref 3.87–5.11)
RDW: 13.9 % (ref 11.5–15.5)
WBC: 4.4 10*3/uL (ref 4.0–10.5)

## 2015-06-12 LAB — PROTIME-INR
INR: 1.22 (ref 0.00–1.49)
Prothrombin Time: 15.6 seconds — ABNORMAL HIGH (ref 11.6–15.2)

## 2015-06-12 LAB — GLUCOSE, CAPILLARY
Glucose-Capillary: 101 mg/dL — ABNORMAL HIGH (ref 65–99)
Glucose-Capillary: 91 mg/dL (ref 65–99)

## 2015-06-12 MED ORDER — ONDANSETRON HCL 4 MG PO TABS: 4.0000 mg | ORAL_TABLET | Freq: Four times a day (QID) | ORAL | Status: DC | PRN

## 2015-06-12 MED ORDER — ROPINIROLE HCL 1 MG PO TABS
5.0000 mg | ORAL_TABLET | Freq: Every day | ORAL | Status: DC
  Administered 2015-06-12 – 2015-06-13 (×2): 5 mg via ORAL
  Filled 2015-06-12 (×2): qty 5

## 2015-06-12 MED ORDER — PEG-KCL-NACL-NASULF-NA ASC-C 100 G PO SOLR
0.5000 | Freq: Once | ORAL | Status: AC
  Administered 2015-06-12: 100 g via ORAL
  Filled 2015-06-12: qty 1

## 2015-06-12 MED ORDER — PEG-KCL-NACL-NASULF-NA ASC-C 100 G PO SOLR: 1.0000 | Freq: Once | ORAL | Status: DC

## 2015-06-12 MED ORDER — ACETAMINOPHEN 650 MG RE SUPP: 650.0000 mg | Freq: Four times a day (QID) | RECTAL | Status: DC | PRN

## 2015-06-12 MED ORDER — INSULIN ASPART 100 UNIT/ML ~~LOC~~ SOLN
0.0000 [IU] | Freq: Three times a day (TID) | SUBCUTANEOUS | Status: DC
  Administered 2015-06-13 – 2015-06-14 (×2): 1 [IU] via SUBCUTANEOUS

## 2015-06-12 MED ORDER — PEG-KCL-NACL-NASULF-NA ASC-C 100 G PO SOLR
0.5000 | Freq: Once | ORAL | Status: AC
  Administered 2015-06-13: 100 g via ORAL
  Filled 2015-06-12: qty 1

## 2015-06-12 MED ORDER — LOSARTAN POTASSIUM 50 MG PO TABS
50.0000 mg | ORAL_TABLET | Freq: Every day | ORAL | Status: DC
  Administered 2015-06-13 – 2015-06-14 (×2): 50 mg via ORAL
  Filled 2015-06-12 (×2): qty 1

## 2015-06-12 MED ORDER — ACETAMINOPHEN 325 MG PO TABS: 650.0000 mg | ORAL_TABLET | Freq: Four times a day (QID) | ORAL | Status: DC | PRN

## 2015-06-12 MED ORDER — OXYCODONE HCL 5 MG PO TABS: 5.0000 mg | ORAL_TABLET | Freq: Four times a day (QID) | ORAL | Status: DC | PRN

## 2015-06-12 MED ORDER — MIRABEGRON ER 25 MG PO TB24
25.0000 mg | ORAL_TABLET | Freq: Every day | ORAL | Status: DC
  Administered 2015-06-13 – 2015-06-14 (×2): 25 mg via ORAL
  Filled 2015-06-12 (×2): qty 1

## 2015-06-12 MED ORDER — BISACODYL 5 MG PO TBEC
10.0000 mg | DELAYED_RELEASE_TABLET | Freq: Once | ORAL | Status: AC
  Administered 2015-06-12: 10 mg via ORAL
  Filled 2015-06-12: qty 2

## 2015-06-12 MED ORDER — ONDANSETRON HCL 4 MG/2ML IJ SOLN: 4.0000 mg | Freq: Four times a day (QID) | INTRAMUSCULAR | Status: DC | PRN

## 2015-06-12 MED ORDER — DICYCLOMINE HCL 10 MG PO CAPS
10.0000 mg | ORAL_CAPSULE | Freq: Two times a day (BID) | ORAL | Status: DC
  Administered 2015-06-12 – 2015-06-14 (×4): 10 mg via ORAL
  Filled 2015-06-12 (×4): qty 1

## 2015-06-12 MED ORDER — SODIUM POLYSTYRENE SULFONATE 15 GM/60ML PO SUSP
15.0000 g | Freq: Once | ORAL | Status: AC
  Administered 2015-06-12: 15 g via ORAL
  Filled 2015-06-12: qty 60

## 2015-06-12 MED ORDER — LEVOTHYROXINE SODIUM 50 MCG PO TABS
75.0000 ug | ORAL_TABLET | Freq: Every day | ORAL | Status: DC
  Administered 2015-06-14: 75 ug via ORAL
  Filled 2015-06-12 (×2): qty 1

## 2015-06-12 MED ORDER — MIRTAZAPINE 15 MG PO TABS
15.0000 mg | ORAL_TABLET | Freq: Every day | ORAL | Status: DC
  Administered 2015-06-12 – 2015-06-13 (×2): 15 mg via ORAL
  Filled 2015-06-12 (×3): qty 1

## 2015-06-12 MED ORDER — PANTOPRAZOLE SODIUM 40 MG PO TBEC
40.0000 mg | DELAYED_RELEASE_TABLET | Freq: Every day | ORAL | Status: DC
  Administered 2015-06-13 – 2015-06-14 (×2): 40 mg via ORAL
  Filled 2015-06-12 (×2): qty 1

## 2015-06-12 MED ORDER — SODIUM CHLORIDE 0.9 % IV SOLN
INTRAVENOUS | Status: AC
  Administered 2015-06-12: 18:00:00 via INTRAVENOUS

## 2015-06-12 MED ORDER — ALBUTEROL SULFATE (2.5 MG/3ML) 0.083% IN NEBU: 2.5000 mg | INHALATION_SOLUTION | RESPIRATORY_TRACT | Status: DC | PRN

## 2015-06-12 MED ORDER — BOOST / RESOURCE BREEZE PO LIQD
1.0000 | Freq: Three times a day (TID) | ORAL | Status: DC
  Administered 2015-06-12: 1 via ORAL

## 2015-06-12 NOTE — Progress Notes (Signed)
                                                                                                                                                                         [facility] Consult: 2:39 PM 06/12/2015     Referring Provider:  Primary Care Physician:  OSEI-BONSU,GEORGE, MD Primary Gastroenterologist:  Dr. Perry.     Reason for Consultation:  Rectal mass, preop for colonoscopy tomorrow.    HPI: [name] is a 81 y.o. female.  GI problems have included but are not limited to GERD complicated by peptic stricture, large hiatal hernia, probable Crohn's disease, chronic abdominal complaints, and ill-defined cystic disease of the pancreas. She also has recurrent DVT and has been on long-term anticoagulation.  Seen by GI [facility] 04/2015 for diarrhea, nausea, abdominal pain.  Hgb was 11.2  Seen by Dr perry last peek for follow up with ongoing sxs and weight loss. last colonoscopy was around 2004.  Rectal mass noted on rectal exam.  Pt therefore set up for inpt colonoscopy with split dose bowel prep starting today.  Advised to stop Eliquis today, so her last dose was on 7/4, yesterday morning.     Past Medical History  Diagnosis Date  . Cyst and pseudocyst of pancreas   . Unspecified essential hypertension   . Other and unspecified hyperlipidemia   . Polymyalgia rheumatica   . Personal history of unspecified digestive disease   . Restless legs syndrome (RLS)   . Insomnia, unspecified   . Other malaise and fatigue   . Diverticulosis of colon (without mention of hemorrhage)   . Acute gastritis without mention of hemorrhage   . Duodenitis without mention of hemorrhage   . Diaphragmatic hernia without mention of obstruction or gangrene   . Esophageal reflux   . Stricture and stenosis of esophagus   . OAB (overactive bladder)   . Left leg DVT jan 2013  . Unspecified sleep apnea     hx of sleep apnea, none since weight loss  . Diabetes mellitus   . Anemia   .  Thyroid disease     hypothyroidism  . CAP (community acquired pneumonia) 04/03/2014  . Arthritis   . Hx pulmonary embolism   . Hiatal hernia- moderate to large 10/06/2014  . Pancreas divisum 03/02/2015  . Pancreatic cyst   . Protein-calorie malnutrition, severe 10/02/2014  . Crohn disease   . Shingles     Past Surgical History  Procedure Laterality Date  . Oophorectomy  1944    not sure which ovary  . Rotator cuff repair  right     4-5 yrs ago  . Eye surgery      cataracts removed-bilateral eyes  . Back surgery    2009    lower back  . Colon surgery  June 17, 2012    surgery for bleed  . Shoulder open rotator cuff repair  08/04/2012    Procedure: ROTATOR CUFF REPAIR SHOULDER OPEN;  Surgeon: Ronald A Gioffre, MD;  Location: [facility];  Service: Orthopedics;  Laterality: Left;  with Anchors and Graft  . Reduction mammaplasty Bilateral 1980    Prior to Admission medications   Medication Sig Start Date End Date Taking? Authorizing Provider  apixaban (ELIQUIS) 2.5 MG TABS tablet Take 1 tablet (2.5 mg total) by mouth 2 (two) times daily. 10/09/14   Belkys A Regalado, MD  benzonatate (TESSALON) 200 MG capsule Take 1 capsule (200 mg total) by mouth 2 (two) times daily as needed for cough. 10/09/14   Belkys A Regalado, MD  cyanocobalamin (,VITAMIN B-12,) 1000 MCG/ML injection Inject 1,000 mcg into the muscle every 30 (thirty) days.    Historical Provider, MD  diclofenac sodium (VOLTAREN) 1 % GEL Apply 2 g topically every morning.    Historical Provider, MD  dicyclomine (BENTYL) 10 MG capsule TAKE 1 CAPSULE BY MOUTH TWICE DAILY FOR STOMACH SPASMS AND CRAMPS 06/08/15   Amy S Esterwood, PA-C  feeding supplement, GLUCERNA SHAKE, (GLUCERNA SHAKE) LIQD Take 237 mLs by mouth 4 (four) times daily - after meals and at bedtime. 10/09/14   Belkys A Regalado, MD  furosemide (LASIX) 20 MG tablet Take 20 mg by mouth as needed.     Historical Provider, MD  hyoscyamine (LEVSIN SL) 0.125 MG SL tablet Place 1 tablet  (0.125 mg total) under the tongue every 6 (six) hours as needed for cramping. 03/03/15   Christina P Rama, MD  l-methylfolate-B6-B12 (METANX) 3-35-2 MG TABS Take 1 tablet by mouth daily.    Historical Provider, MD  levothyroxine (SYNTHROID, LEVOTHROID) 75 MCG tablet Take 75 mcg by mouth daily before breakfast.    Historical Provider, MD  LITE TOUCH LANCETS MISC daily.  04/17/15   Historical Provider, MD  losartan (COZAAR) 50 MG tablet Take 50 mg by mouth daily.    Historical Provider, MD  metFORMIN (GLUCOPHAGE) 500 MG tablet Take 1 tablet (500 mg total) by mouth 2 (two) times daily with a meal. 03/03/15   Christina P Rama, MD  mirabegron ER (MYRBETRIQ) 25 MG TB24 tablet Take 25 mg by mouth daily.    Historical Provider, MD  mirtazapine (REMERON) 15 MG tablet Take 15 mg by mouth at bedtime.    Historical Provider, MD  ondansetron (ZOFRAN) 4 MG tablet Take 1 tab daily every morning with breakfast for nausea. 05/01/15   Amy S Esterwood, PA-C  oxyCODONE (OXY IR/ROXICODONE) 5 MG immediate release tablet Take 2 tabs= 10 mg by mouth every 6 hours as needed for nausea. 03/06/15   Arthur G Green, MD  pantoprazole (PROTONIX) 40 MG tablet Take 40 mg by mouth daily.    Historical Provider, MD  PHARMACIST CHOICE NO CODING test strip 1 each by Other route as needed.  04/17/15   Historical Provider, MD  polyethylene glycol (MIRALAX / GLYCOLAX) packet Take 17 g by mouth daily. 03/03/15   Christina P Rama, MD  ropinirole (REQUIP) 5 MG tablet Take 5 mg by mouth at bedtime.    Historical Provider, MD    Scheduled Meds: . bisacodyl  10 mg Oral Once  . [START ON 06/13/2015] peg 3350 powder  0.5 kit Oral Once  . peg 3350 powder  0.5 kit Oral Once   Infusions:   PRN Meds:      Allergies as of 06/06/2015 - Review Complete 06/06/2015  Allergen Reaction Noted  . Sulfa antibiotics Nausea And Vomiting 03/31/2011  . Penicillins Hives and Rash 03/31/2011    Family History  Problem Relation Age of Onset  . Melanoma  Daughter     died age 21  . Melanoma Son     History   Social History  . Marital Status: Widowed    Spouse Name: N/A  . Number of Children: 2  . Years of Education: N/A   Occupational History  . Retired    Social History Main Topics  . Smoking status: Never Smoker   . Smokeless tobacco: Never Used  . Alcohol Use: No  . Drug Use: No  . Sexual Activity: No   Other Topics Concern  . Not on file   Social History Narrative    REVIEW OF SYSTEMS: Walks using cane or walker, no falls Some intermittent DOE, not worse than usual.  No cough.  No chest pain.  No dizziness.  No limb swelling. No unusual bleeding or bruising.   PHYSICAL EXAM: Vital signs in last 24 hours: Filed Vitals:   06/12/15 1410  BP: 134/42  Pulse: 64  Temp: 98 F (36.7 C)  Resp: 16   Wt Readings from Last 3 Encounters:  06/12/15 103 lb 13.4 oz (47.1 kg)  05/01/15 102 lb 4 oz (46.38 kg)  03/06/15 104 lb (47.174 kg)    General: pleasant, alert.  Comfortable.  Frail, pale.  Head:  No asymmetry or swellilng  Eyes:  No icterus or pallor  Ears:  HOH  Nose:  No discharge but sounds congested Mouth:  Clear and moist Neck:  No jvd, bruits or masses Lungs:  Clear bil.   Heart: RRR Abdomen:  Soft, slight diffuse discomfort, no guard or rebound.  No mass, no HSM, .   Rectal: deferred   Musc/Skeltl: no joint swelling.  Arthritic deformities in hands and fingers.  Kyphosis.  Extremities:  No CCE  Neurologic:  No tremor, no limb weakness Skin:  No telangectasia, sores or rashes Tattoos:  none Nodes:  No cervical adenopathy.    Psych:  Pleasant, relaxed.   Intake/Output from previous day:   Intake/Output this shift:    LAB RESULTS: No results for input(s): WBC, HGB, HCT, PLT in the last 72 hours. BMET Lab Results  Component Value Date   NA 139 05/01/2015   NA 138 03/01/2015   NA 138 02/28/2015   K 4.7 05/01/2015   K 4.3 03/01/2015   K 4.8 02/28/2015   CL 105 05/01/2015   CL 105  03/01/2015   CL 102 02/28/2015   CO2 28 05/01/2015   CO2 25 03/01/2015   CO2 26 02/28/2015   GLUCOSE 128* 05/01/2015   GLUCOSE 94 03/01/2015   GLUCOSE 130* 02/28/2015   BUN 33* 05/01/2015   BUN 27* 03/01/2015   BUN 39* 02/28/2015   CREATININE 1.00 05/01/2015   CREATININE 0.91 03/01/2015   CREATININE 1.11* 02/28/2015   CALCIUM 9.4 05/01/2015   CALCIUM 8.6 03/01/2015   CALCIUM 9.3 02/28/2015   LFT No results for input(s): PROT, ALBUMIN, AST, ALT, ALKPHOS, BILITOT, BILIDIR, IBILI in the last 72 hours. PT/INR Lab Results  Component Value Date   INR 1.13 10/02/2014   INR 1.13 04/03/2014   INR 1.09 04/03/2014   Hepatitis Panel No results for input(s): HEPBSAG, HCVAB, HEPAIGM, HEPBIGM in the last 72 hours. C-Diff No components found for: CDIFF Lipase     Component Value Date/Time  LIPASE 28.0 05/01/2015 1456     IMPRESSION:   *  Chronic abdominal pain, diarrhea, weight loss,  Rectal mass on exam of 6/29.  Admitted for bowel prep.  Colonoscopy set at 1130 AM tomorrow.  ? Crohn's disease in past.   *  Multiple medical problems: including long term anticoagulation for hx DVT.     PLAN:     *  Colonoscopy tomorrow.  Moviprep, split dose starts tonight.    Sarah Gribbin  06/12/2015, 2:36 PM Pager: [phone]  GI ATTENDING  As above. Just seen in office. Plans for colonoscopy tomorrow. Noted that there is some question regarding the last dose of Eliquis  John N. Perry, Jr., M.D. [facility]enter[facility] 

## 2015-06-12 NOTE — Progress Notes (Signed)
Addendum  Labs reviewed. Mild hyperkalemia with K 5.3. No hemolysis documented. Hemoglobin 10.2.  Not on potassium supplements. However on ARB.  Will provide a dose of Kayexalate. Follow CBC and BMP in a.m.  Vernell Leep, MD, FACP, FHM. [facility] Pager [phone]  If 7PM-7AM, please contact night-coverage [email or web address] Password [email or web address] 06/12/2015, 6:20 PM

## 2015-06-12 NOTE — H&P (Addendum)
History and Physical  [name] [id number]:[id number] DOB: December 10, 1918 DOA: 06/12/2015  Referring physician: Azucena Freed, [facility] PA-C PCP: Benito Mccreedy, MD  Outpatient Specialists:  1. Gastroenterology: Dr. Scarlette Shorts  Chief Complaint: Chronic abdominal pain, diarrhea, weight loss and rectal mass  HPI: [name] is a 81 y.o. female , single, lives alone, ambulates with the help of cane or walker, PMH of recurrent bilateral DVT, PE on Eliquis (last dose 06/11/15), HTN, DM 2, hypothyroid, overactive bladder, GERD, duodenitis, diverticulosis, probable Crohn's disease, hospitalized at [facility] 02/28/15-03/03/15 for vomiting which was felt to be related to gastritis in the setting of large hiatal hernia, viridans streptococcal UTI was electively brought to the hospital on 06/12/15 by [facility] for elective colonoscopy on 06/13/15. She has been having several weeks history of chronic abdominal pain-indicates epigastric region, intermittent, mild, no aggravating or relieving factors, intermittent and occasional nonbloody emesis, fluctuating appetite, 20 pound weight loss over undetermined timeframe, ongoing diarrhea-states one to 2 watery BMs per day with associated incontinence but no blood. This is associated with generalized weakness. Seen by Dr. Henrene Pastor in the office on 6/29 who noted apparent rectal mass on physical exam. Concern for overflow diarrhea if this is carcinoma. Needs endoscopic evaluation but given her high risk from advanced age and comorbidities, it was determined that the procedure needed to be performed in the hospital with hospitalization day prior to procedure for bowel prep. Hospitalist admission was requested.    Review of Systems: All systems reviewed and apart from history of presenting illness, are negative.  Past Medical History  Diagnosis Date  . Cyst and pseudocyst of pancreas   . Unspecified essential hypertension   . Other and unspecified  hyperlipidemia   . Polymyalgia rheumatica   . Personal history of unspecified digestive disease   . Restless legs syndrome (RLS)   . Insomnia, unspecified   . Other malaise and fatigue   . Diverticulosis of colon (without mention of hemorrhage)   . Acute gastritis without mention of hemorrhage   . Duodenitis without mention of hemorrhage   . Diaphragmatic hernia without mention of obstruction or gangrene   . Esophageal reflux   . Stricture and stenosis of esophagus   . OAB (overactive bladder)   . Left leg DVT jan 2013  . Anemia   . Hx pulmonary embolism   . Hiatal hernia- moderate to large 10/06/2014  . Pancreas divisum 03/02/2015  . Pancreatic cyst   . Protein-calorie malnutrition, severe 10/02/2014  . Crohn disease   . Shingles   . DVT (deep venous thrombosis)     "twice on the left leg; once on the right leg" (06/12/2015)  . Hypothyroidism   . CAP (community acquired pneumonia) 04/03/2014  . Unspecified sleep apnea     hx of sleep apnea, none since weight loss (06/12/2015)  . Type II diabetes mellitus   . Migraine     hx  . Arthritis     "LLL; palm of right hand" (06/12/2015)  . Depression   . Anxiety   . Urinary frequency   . Urinary urgency   . Dysrhythmia   . Atrial fibrillation    Past Surgical History  Procedure Laterality Date  . Oophorectomy  1944    not sure which ovary  . Rotator cuff repair Right   . Cataract extraction, bilateral Bilateral   . Posterior lumbar fusion  01/2009    L4-L5 [facility] 04/09/2011  . Colon surgery  June 17, 2012  surgery for bleed  . Shoulder open rotator cuff repair  08/04/2012    Procedure: ROTATOR CUFF REPAIR SHOULDER OPEN;  Surgeon: Tobi Bastos, MD;  Location: [facility];  Service: Orthopedics;  Laterality: Left;  with Anchors and Graft  . Tonsillectomy    . Shoulder open rotator cuff repair Right 2006    [facility] Endo 04/22/2011  . Closed reduction nasal fracture  08/2008    [facility] Endo 04/10/2011  . Back surgery    . Reduction mammaplasty  Bilateral 1980   Social History:  reports that she has never smoked. She has never used smokeless tobacco. She reports that she does not drink alcohol or use illicit drugs. Single. Lives alone and ambulates with the help of a cane or walker.  Allergies  Allergen Reactions  . Sulfa Antibiotics Nausea And Vomiting  . Penicillins Hives and Rash    Family History  Problem Relation Age of Onset  . Melanoma Daughter     died age 52  . Melanoma Son     Prior to Admission medications   Medication Sig Start Date End Date Taking? Authorizing Provider  apixaban (ELIQUIS) 2.5 MG TABS tablet Take 1 tablet (2.5 mg total) by mouth 2 (two) times daily. 10/09/14  Yes Belkys A Regalado, MD  benzonatate (TESSALON) 200 MG capsule Take 1 capsule (200 mg total) by mouth 2 (two) times daily as needed for cough. 10/09/14   Belkys A Regalado, MD  Blood Glucose Calibration (TAI DOC CONTROL) NORMAL SOLN USE TO PERIODICALLY CHECK GLUCOSE MONITOR ACCORDING TO THE MANUAL 04/17/15   Historical Provider, MD  Blood Glucose Monitoring Suppl (CLEVER CHEK AUTO-CODE VOICE) [place] USE TO TEST BLOOD GLUCOSE 04/17/15   Historical Provider, MD  cyanocobalamin (,VITAMIN B-12,) 1000 MCG/ML injection Inject 1,000 mcg into the muscle every 30 (thirty) days.    Historical Provider, MD  diclofenac sodium (VOLTAREN) 1 % GEL Apply 2 g topically every morning.    Historical Provider, MD  dicyclomine (BENTYL) 10 MG capsule TAKE 1 CAPSULE BY MOUTH TWICE DAILY FOR STOMACH SPASMS AND CRAMPS 06/08/15   Amy S Esterwood, PA-C  feeding supplement, GLUCERNA SHAKE, (GLUCERNA SHAKE) LIQD Take 237 mLs by mouth 4 (four) times daily - after meals and at bedtime. 10/09/14   Belkys A Regalado, MD  furosemide (LASIX) 20 MG tablet Take 20 mg by mouth as needed.     Historical Provider, MD  hyoscyamine (LEVSIN SL) 0.125 MG SL tablet Place 1 tablet (0.125 mg total) under the tongue every 6 (six) hours as needed for cramping. 03/03/15   Venetia Maxon Rama, MD    l-methylfolate-B6-B12 (METANX) 3-35-2 MG TABS Take 1 tablet by mouth daily.    Historical Provider, MD  levothyroxine (SYNTHROID, LEVOTHROID) 75 MCG tablet Take 75 mcg by mouth daily before breakfast.    Historical Provider, MD  LITE TOUCH LANCETS MISC daily.  04/17/15   Historical Provider, MD  losartan (COZAAR) 50 MG tablet Take 50 mg by mouth daily.    Historical Provider, MD  metFORMIN (GLUCOPHAGE) 500 MG tablet Take 1 tablet (500 mg total) by mouth 2 (two) times daily with a meal. 03/03/15   Venetia Maxon Rama, MD  mirabegron ER (MYRBETRIQ) 25 MG TB24 tablet Take 25 mg by mouth daily.    Historical Provider, MD  mirtazapine (REMERON) 15 MG tablet Take 15 mg by mouth at bedtime.    Historical Provider, MD  ondansetron (ZOFRAN) 4 MG tablet Take 1 tab daily every morning with breakfast for nausea. 05/01/15  Amy S Esterwood, PA-C  oxyCODONE (OXY IR/ROXICODONE) 5 MG immediate release tablet Take 2 tabs= 10 mg by mouth every 6 hours as needed for nausea. 03/06/15   Estill Dooms, MD  pantoprazole (PROTONIX) 40 MG tablet Take 40 mg by mouth daily.    Historical Provider, MD  PHARMACIST CHOICE NO CODING test strip 1 each by Other route as needed.  04/17/15   Historical Provider, MD  polyethylene glycol (MIRALAX / GLYCOLAX) packet Take 17 g by mouth daily. 03/03/15   Venetia Maxon Rama, MD  ropinirole (REQUIP) 5 MG tablet Take 5 mg by mouth at bedtime.    Historical Provider, MD   Physical Exam: Filed Vitals:   06/12/15 1410  BP: 134/42  Pulse: 64  Temp: 98 F (36.7 C)  TempSrc: Oral  Resp: 16  Height: 5' (1.524 m)  Weight: 47.1 kg (103 lb 13.4 oz)  SpO2: 98%     General exam: Small built and frail pleasant elderly female patient, lying comfortably supine in bed in no obvious distress.  Head, eyes and ENT: Nontraumatic and normocephalic. Pupils equally reacting to light and accommodation. Oral mucosa moist. Hard of hearing.  Neck: Supple. No JVD, carotid bruit or thyromegaly.  Lymphatics: No  lymphadenopathy.  Respiratory system: Clear to auscultation. No increased work of breathing.  Cardiovascular system: S1 and S2 heard, RRR. No JVD, murmurs, gallops, clicks or pedal edema.  Gastrointestinal system: Abdomen is nondistended, soft. Mild tenderness in the left mid quadrant without peritoneal signs. Normal bowel sounds heard. No organomegaly or masses appreciated. Rectal exam was deferred.  Central nervous system: Alert and oriented. No focal neurological deficits.  Extremities: Symmetric 5 x 5 power. Peripheral pulses symmetrically felt.   Skin: No rashes or acute findings.  Musculoskeletal system: Negative exam.  Psychiatry: Pleasant and cooperative.   Labs on Admission:  Patient was directly admitted to the floor from home without labs. Requested labs (CBC, CMP and INR)   Assessment/Plan Active Problems:   Essential hypertension   Anemia of chronic disease   Type 2 diabetes mellitus   Hx pulmonary embolism   Abdominal pain, epigastric   Abdominal pain   Chronic abdominal pain, decreased appetite, weight loss, chronic diarrhea with incontinence and possible rectal mass - Concern for malignancy. [facility] consultation appreciated. Undergoing bowel prep today with plans for colonoscopy 7/6. They are aware that patient took her last dose of Eliquis 7/4 at unknown time. - Clear liquid diet today and nothing by mouth after 5 AM on 7/6  Essential hypertension - Controlled on home Cozaar  Type II DM - Hold metformin. Placed on NovoLog SSI.  History of recurrent VTE - Eliquis being held for colonoscopy 06/13/15. Last dose per pharmacy interview with patient 06/11/15 - SCDs  Anemia of chronic disease - Follow CBCs  Hypothyroid - Continue Synthroid  Overactive bladder - Continue Myrbetric  GERD - PPI  Severe malnutrition - Nutrition consult and continue supplements       Code Status: DO NOT RESUSCITATE. Confirmed with patient with admitting nurse  at bedside.   Family Communication: None at bedside.   Disposition Plan: DC home pending GI clearance   Time spent: 22 minutes   Mathhew Buysse, MD, FACP, FHM. [facility] Pager [phone]  If 7PM-7AM, please contact night-coverage [email or web address] Password [email or web address] 06/12/2015, 4:28 PM

## 2015-06-13 ENCOUNTER — Encounter (HOSPITAL_COMMUNITY): Payer: Self-pay | Admitting: *Deleted

## 2015-06-13 ENCOUNTER — Observation Stay (HOSPITAL_COMMUNITY): Payer: Medicare Other | Admitting: Anesthesiology

## 2015-06-13 ENCOUNTER — Encounter (HOSPITAL_COMMUNITY)
Admission: RE | Disposition: A | Discharge status: Home / Self Care | Payer: Self-pay | Source: Ambulatory Visit | Attending: Internal Medicine

## 2015-06-13 DIAGNOSIS — E039 Hypothyroidism, unspecified: Secondary | ICD-10-CM

## 2015-06-13 DIAGNOSIS — D689 Coagulation defect, unspecified: Secondary | ICD-10-CM | POA: Diagnosis not present

## 2015-06-13 DIAGNOSIS — K629 Disease of anus and rectum, unspecified: Secondary | ICD-10-CM | POA: Diagnosis not present

## 2015-06-13 DIAGNOSIS — R1032 Left lower quadrant pain: Secondary | ICD-10-CM

## 2015-06-13 DIAGNOSIS — K5730 Diverticulosis of large intestine without perforation or abscess without bleeding: Secondary | ICD-10-CM | POA: Diagnosis not present

## 2015-06-13 DIAGNOSIS — Z86711 Personal history of pulmonary embolism: Secondary | ICD-10-CM

## 2015-06-13 DIAGNOSIS — R198 Other specified symptoms and signs involving the digestive system and abdomen: Secondary | ICD-10-CM | POA: Diagnosis not present

## 2015-06-13 DIAGNOSIS — R197 Diarrhea, unspecified: Secondary | ICD-10-CM | POA: Diagnosis not present

## 2015-06-13 DIAGNOSIS — K6289 Other specified diseases of anus and rectum: Secondary | ICD-10-CM | POA: Insufficient documentation

## 2015-06-13 HISTORY — PX: COLONOSCOPY: SHX5424

## 2015-06-13 LAB — BASIC METABOLIC PANEL
ANION GAP: 10 (ref 5–15)
BUN: 22 mg/dL — ABNORMAL HIGH (ref 6–20)
CALCIUM: 9.2 mg/dL (ref 8.9–10.3)
CO2: 23 mmol/L (ref 22–32)
Chloride: 109 mmol/L (ref 101–111)
Creatinine, Ser: 0.86 mg/dL (ref 0.44–1.00)
GFR calc Af Amer: >60 mL/min (ref >60)
GFR calc non Af Amer: 56 mL/min — ABNORMAL LOW (ref >60)
Glucose, Bld: 76 mg/dL (ref 65–99)
Potassium: 4.4 mmol/L (ref 3.5–5.1)
SODIUM: 142 mmol/L (ref 135–145)

## 2015-06-13 LAB — GLUCOSE, CAPILLARY
Glucose-Capillary: 91 mg/dL (ref 65–99)
Glucose-Capillary: 89 mg/dL (ref 65–99)
Glucose-Capillary: 144 mg/dL — ABNORMAL HIGH (ref 65–99)
Glucose-Capillary: 138 mg/dL — ABNORMAL HIGH (ref 65–99)

## 2015-06-13 LAB — CBC
HEMATOCRIT: 36 % (ref 36.0–46.0)
HEMOGLOBIN: 11.6 g/dL — AB (ref 12.0–15.0)
MCH: 29.7 pg (ref 26.0–34.0)
MCHC: 32.2 g/dL (ref 30.0–36.0)
MCV: 92.1 fL (ref 78.0–100.0)
Platelets: 240 10*3/uL (ref 150–400)
RBC: 3.91 MIL/uL (ref 3.87–5.11)
RDW: 13.9 % (ref 11.5–15.5)
WBC: 4.4 10*3/uL (ref 4.0–10.5)

## 2015-06-13 LAB — TSH: TSH: 1.618 u[IU]/mL (ref 0.350–4.500)

## 2015-06-13 SURGERY — COLONOSCOPY
Anesthesia: Moderate Sedation

## 2015-06-13 SURGERY — COLONOSCOPY
Anesthesia: Monitor Anesthesia Care

## 2015-06-13 MED ORDER — GLUCERNA SHAKE PO LIQD
237.0000 mL | Freq: Three times a day (TID) | ORAL | Status: DC
  Administered 2015-06-13 – 2015-06-14 (×2): 237 mL via ORAL

## 2015-06-13 MED ORDER — BISACODYL 5 MG PO TBEC
10.0000 mg | DELAYED_RELEASE_TABLET | Freq: Once | ORAL | Status: AC
  Administered 2015-06-13: 10 mg via ORAL
  Filled 2015-06-13: qty 2

## 2015-06-13 MED ORDER — APIXABAN 2.5 MG PO TABS
2.5000 mg | ORAL_TABLET | Freq: Two times a day (BID) | ORAL | Status: DC
  Administered 2015-06-13 – 2015-06-14 (×2): 2.5 mg via ORAL
  Filled 2015-06-13 (×2): qty 1

## 2015-06-13 MED ORDER — SODIUM CHLORIDE 0.9 % IV SOLN: INTRAVENOUS | Status: DC

## 2015-06-13 MED ORDER — LACTATED RINGERS IV SOLN
INTRAVENOUS | Status: DC
  Administered 2015-06-13: 11:00:00 via INTRAVENOUS

## 2015-06-13 NOTE — Interval H&P Note (Signed)
History and Physical Interval Note:  06/13/2015 11:05 AM  [name]  has presented today for surgery, with the diagnosis of rectal mass  The various methods of treatment have been discussed with the patient and family. After consideration of risks, benefits and other options for treatment, the patient has consented to  Procedure(s): COLONOSCOPY (N/A) as a surgical intervention .  The patient's history has been reviewed, patient examined, no change in status, stable for surgery.  I have reviewed the patient's chart and labs.  Questions were answered to the patient's satisfaction.     Scarlette Shorts

## 2015-06-13 NOTE — Progress Notes (Signed)
PROGRESS NOTE  [name] OEV:[id number] DOB: [birth year] DOA: 06/12/2015 PCP: Benito Mccreedy, MD  HPI/Recap of past 24 hours: Returned from colonoscopy, denies pain  Assessment/Plan: Active Problems:   Essential hypertension   Anemia of chronic disease   Type 2 diabetes mellitus   Hx pulmonary embolism   Abdominal pain, epigastric   Abdominal pain   Diarrhea   Rectal mass   Diverticulosis of colon without hemorrhage  Chronic abdominal pain, decreased appetite, weight loss, chronic diarrhea with incontinence and possible rectal mass - [facility] consultation appreciated. s/p colonoscopy 7/6. No malignancy. Does has left sided diverticulosis, rectal mass felt to be hard stool, diarrhea felt to be overflow diarrhea due to constipation. resume Eliquis. - outpatient GI follow up.  Essential hypertension - Controlled on home Cozaar  Type II DM - Hold metformin. Placed on NovoLog SSI.  History of recurrent VTE/h/o PAF - Eliquis being held for colonoscopy 06/13/15. Last dose per pharmacy interview with patient 06/11/15 - SCDs -resume eliquis post colonoscopy.  Anemia of chronic disease - hgb stable at baseline  Hypothyroid - Continue Synthroid,, check tsh, consider increase synthroid to decrease constipation.  Overactive bladder - Continue Myrbetric  GERD - PPI  Severe malnutrition - Nutrition consult and continue supplements  FTT: PT/OT/home health, patient continue to live by herself, son and grandaughter lives close by     Code Status: DO NOT RESUSCITATE. Confirmed with patient with admitting nurse at bedside.  Family Communication: None at bedside.  Disposition Plan: DC home with [place], likely 7/7     Consultants:  [facility]  Procedures:  Colonoscopy 7/6  Antibiotics:  none   Objective: BP 133/68 mmHg  Pulse 127  Temp(Src) 97.5 F (36.4 C) (Oral)  Resp 20  Ht 5' (1.524 m)  Wt 47.1 kg (103 lb 13.4 oz)  BMI 20.28 kg/m2  SpO2  100%  Intake/Output Summary (Last 24 hours) at 06/13/15 1548 Last data filed at 06/13/15 1357  Gross per 24 hour  Intake    360 ml  Output      0 ml  Net    360 ml   Filed Weights   06/12/15 1410  Weight: 47.1 kg (103 lb 13.4 oz)    Exam:   General:  NAD  Cardiovascular: RRR  Respiratory: CTABL  Abdomen: Soft/ND/NT, positive BS  Musculoskeletal: No Edema  Neuro: nofocal deficit  Data Reviewed: Basic Metabolic Panel:  Recent Labs Lab 06/12/15 1600 06/13/15 0406  NA 139 142  K 5.3* 4.4  CL 106 109  CO2 26 23  GLUCOSE 82 76  BUN 29* 22*  CREATININE 0.88 0.86  CALCIUM 9.1 9.2   Liver Function Tests:  Recent Labs Lab 06/12/15 1600  AST 26  ALT 10*  ALKPHOS 77  BILITOT 0.4  PROT 5.8*  ALBUMIN 2.9*   No results for input(s): LIPASE, AMYLASE in the last 168 hours. No results for input(s): AMMONIA in the last 168 hours. CBC:  Recent Labs Lab 06/12/15 1600 06/13/15 0406  WBC 4.4 4.4  HGB 10.2* 11.6*  HCT 32.9* 36.0  MCV 91.6 92.1  PLT 218 240   Cardiac Enzymes:   No results for input(s): CKTOTAL, CKMB, CKMBINDEX, TROPONINI in the last 168 hours. BNP (last 3 results) No results for input(s): BNP in the last 8760 hours.  ProBNP (last 3 results) No results for input(s): PROBNP in the last 8760 hours.  CBG:  Recent Labs Lab 06/12/15 1729 06/12/15 2219 06/13/15 0747 06/13/15 1313  GLUCAP 101*  91 91 89    No results found for this or any previous visit (from the past 240 hour(s)).   Studies: No results found.  Scheduled Meds: . apixaban  2.5 mg Oral BID  . dicyclomine  10 mg Oral BID  . feeding supplement (RESOURCE BREEZE)  1 Container Oral TID BM  . insulin aspart  0-9 Units Subcutaneous TID WC  . levothyroxine  75 mcg Oral QAC breakfast  . losartan  50 mg Oral Daily  . mirabegron ER  25 mg Oral Daily  . mirtazapine  15 mg Oral QHS  . pantoprazole  40 mg Oral Daily  . ropinirole  5 mg Oral QHS    Continuous Infusions:     Time spent: 54mns  Regina Ganci MD, PhD  [facility] Pager [phone] If 7PM-7AM, please contact night-coverage at [email or web address], password [email or web address]7/05/2015, 3:48 PM

## 2015-06-13 NOTE — Progress Notes (Signed)
   06/13/15 1000  Clinical Encounter Type  Visited With Patient  Visit Type Initial;Spiritual support;Social support  Referral From Nurse  Spiritual Encounters  Spiritual Needs Emotional  Stress Factors  Patient Stress Factors Lack of knowledge   Chaplain was referred to patient via spiritual care consult. Chaplain visited with patient briefly this morning. Patient was appreciative of visit but was not very talkative. Patient said she came into the hospital last night and is still waiting to find out what her plan of care will be. Patient did not express any support needs at this time. Chaplain will continue to provide emotional and spiritual care for patient as needed.  Gar Ponto, Chaplain  ]10:21 AM

## 2015-06-13 NOTE — Evaluation (Signed)
Physical Therapy Evaluation Patient Details Name: [name] MRN: [id number] DOB: [birth year] Today's Date: 06/13/2015   History of Present Illness  Patient is a 81 y/o female with PMH of recurrent bilateral DVT, PE on Eliquis (last dose 06/11/15), HTN, DM 2, hypothyroid, overactive bladder, GERD, duodenitis, diverticulosis, probable Crohn's disease, hospitalized at [facility] 02/28/15-03/03/15 for vomiting which was felt to be related to gastritis in the setting of large hiatal hernia, viridans streptococcal UTI was electively brought to the hospital on 06/12/15 by [facility] for elective colonoscopy on 06/13/15.  Clinical Impression  Patient presents with decreased mobility due to deficits listed in PT problem list.  She will benefit from skilled PT in the acute setting to allow return home with intermittent help.  She has been to rehab at [facility] in the past from other admissions, but hopes to be able to go home (if needs to go to rehab would prefer to go to [facility].)  Encouraged frequent mobility with nursing assist to maintain strength as well.  PT to follow.    Follow Up Recommendations Home health PT;Supervision - Intermittent    Equipment Recommendations  None recommended by PT    Recommendations for Other Services       Precautions / Restrictions Precautions Precautions: Fall Precaution Comments: two remote falls couple of years past      Mobility  Bed Mobility Overal bed mobility: Needs Assistance Bed Mobility: Supine to Sit;Sit to Supine;Rolling Rolling: Modified independent (Device/Increase time)   Supine to sit: Supervision Sit to supine: Min guard   General bed mobility comments: rolling with rail for hygiene due to soiled in bed, increased time and effortful to push up from sidelying, guided feet in bed to supine  Transfers Overall transfer level: Needs assistance Equipment used: Rolling walker (2 wheeled) Transfers: Sit to/from Stand Sit to Stand:  Min assist         General transfer comment: assist to stand from bed, safe technique   Ambulation/Gait Ambulation/Gait assistance: Min guard Ambulation Distance (Feet): 50 Feet Assistive device: Quad cane Gait Pattern/deviations: Step-through pattern;Decreased stride length;Narrow base of support     General Gait Details: using quad cane as SPC, assist for safety, stayed in room due to just s/p colon prep and soon going to endoscopy  Stairs            Wheelchair Mobility    Modified Rankin (Stroke Patients Only)       Balance Overall balance assessment: Needs assistance           Standing balance-Leahy Scale: Fair Standing balance comment: able to stand static supervision                             Pertinent Vitals/Pain Pain Assessment: No/denies pain    Home Living Family/patient expects to be discharged to:: Private residence Living Arrangements: Alone Available Help at Discharge: Personal care attendant;Available PRN/intermittently Type of Home: Other(Comment) (condo) Home Access: Stairs to enter Entrance Stairs-Rails: Can reach both;Left;Right Entrance Stairs-Number of Steps: 5 Home Layout: One level Home Equipment: Walker - 2 wheels;Walker - 4 wheels;Cane - quad;Grab bars - tub/shower;Tub bench Additional Comments: hired caregivers  MWF for a few hours; can come for longer periods of time if needed    Prior Function Level of Independence: Independent with assistive device(s);Needs assistance   Gait / Transfers Assistance Needed: independent with RW     Comments: cargiver prepares meals, supervises shower.  Hand Dominance   Dominant Hand: Right    Extremity/Trunk Assessment   Upper Extremity Assessment: RUE deficits/detail;LUE deficits/detail RUE Deficits / Details: Shoulder flexion limited to about 60 AROM, strength grossly 4-/5 elbow and hand     LUE Deficits / Details: AROM shoulder flexion about 90, strength grossly  4/5   Lower Extremity Assessment: RLE deficits/detail;LLE deficits/detail RLE Deficits / Details: AROM WFL, strength hip flexion 4-/5, knee extension 4/5, ankle DF 4/5 LLE Deficits / Details: AROM WFL, strength hip flexion 3+/5, knee extension 4/5, ankle DF 4/5     Communication   Communication: HOH  Cognition Arousal/Alertness: Awake/alert Behavior During Therapy: WFL for tasks assessed/performed Overall Cognitive Status: Within Functional Limits for tasks assessed                      General Comments      Exercises        Assessment/Plan    PT Assessment Patient needs continued PT services  PT Diagnosis Generalized weakness;Abnormality of gait   PT Problem List Decreased strength;Decreased activity tolerance;Decreased balance;Decreased mobility;Decreased knowledge of use of DME  PT Treatment Interventions DME instruction;Balance training;Gait training;Stair training;Functional mobility training;Therapeutic activities;Therapeutic exercise;Patient/family education   PT Goals (Current goals can be found in the Care Plan section) Acute Rehab PT Goals Patient Stated Goal: To go home Time For Goal Achievement: 06/27/15 Potential to Achieve Goals: Good    Frequency Min 3X/week   Barriers to discharge Decreased caregiver support      Co-evaluation               End of Session Equipment Utilized During Treatment: Gait belt Activity Tolerance: Patient tolerated treatment well Patient left: in bed;with call bell/phone within reach Nurse Communication: Mobility status    Functional Assessment Tool Used: Clinical Judgement  Functional Limitation: Mobility: Walking and moving around Mobility: Walking and Moving Around Current Status (V7793): At least 20 percent but less than 40 percent impaired, limited or restricted Mobility: Walking and Moving Around Goal Status [phone]): At least 1 percent but less than 20 percent impaired, limited or restricted    Time:  0942-1009 PT Time Calculation (min) (ACUTE ONLY): 27 min   Charges:   PT Evaluation $Initial PT Evaluation Tier I: 1 Procedure PT Treatments $Gait Training: 8-22 mins   PT G Codes:   PT G-Codes **NOT FOR INPATIENT CLASS** Functional Assessment Tool Used: Clinical Judgement  Functional Limitation: Mobility: Walking and moving around Mobility: Walking and Moving Around Current Status (P2330): At least 20 percent but less than 40 percent impaired, limited or restricted Mobility: Walking and Moving Around Goal Status [phone]): At least 1 percent but less than 20 percent impaired, limited or restricted    [facility] 06/13/2015, 10:43 AM  Magda Kiel, [place] 06/13/2015

## 2015-06-13 NOTE — Progress Notes (Signed)
Per nightshift RN, patient unable to finish Movi-Prep. Stools still murky this morning, so Dulcolax tabs were administered per order from Dr. Brodie. Notified Sarah Gribbin, PA to see if other interventions were needed and received orders for a Tap Water Enema. Attempted enema, but met significant resistance and patient was unable to hold water in. Followed up with PA and Endo RN, no further orders received. 

## 2015-06-13 NOTE — H&P (View-Only) (Signed)
[facility] Consult: 2:39 PM 06/12/2015     Referring Provider:  Primary Care Physician:  Benito Mccreedy, MD Primary Gastroenterologist:  Dr. Henrene Pastor.     Reason for Consultation:  Rectal mass, preop for colonoscopy tomorrow.    HPI: [name] is a 81 y.o. female.  GI problems have included but are not limited to GERD complicated by peptic stricture, large hiatal hernia, probable Crohn's disease, chronic abdominal complaints, and ill-defined cystic disease of the pancreas. She also has recurrent DVT and has been on long-term anticoagulation.  Seen by GI APP 04/2015 for diarrhea, nausea, abdominal pain.  Hgb was 11.2  Seen by Dr Henrene Pastor last peek for follow up with ongoing sxs and weight loss. last colonoscopy was around 2004.  Rectal mass noted on rectal exam.  Pt therefore set up for inpt colonoscopy with split dose bowel prep starting today.  Advised to stop Eliquis today, so her last dose was on 7/4, yesterday morning.     Past Medical History  Diagnosis Date  . Cyst and pseudocyst of pancreas   . Unspecified essential hypertension   . Other and unspecified hyperlipidemia   . Polymyalgia rheumatica   . Personal history of unspecified digestive disease   . Restless legs syndrome (RLS)   . Insomnia, unspecified   . Other malaise and fatigue   . Diverticulosis of colon (without mention of hemorrhage)   . Acute gastritis without mention of hemorrhage   . Duodenitis without mention of hemorrhage   . Diaphragmatic hernia without mention of obstruction or gangrene   . Esophageal reflux   . Stricture and stenosis of esophagus   . OAB (overactive bladder)   . Left leg DVT jan 2013  . Unspecified sleep apnea     hx of sleep apnea, none since weight loss  . Diabetes mellitus   . Anemia   .  Thyroid disease     hypothyroidism  . CAP (community acquired pneumonia) 04/03/2014  . Arthritis   . Hx pulmonary embolism   . Hiatal hernia- moderate to large 10/06/2014  . Pancreas divisum 03/02/2015  . Pancreatic cyst   . Protein-calorie malnutrition, severe 10/02/2014  . Crohn disease   . Shingles     Past Surgical History  Procedure Laterality Date  . Oophorectomy  1944    not sure which ovary  . Rotator cuff repair  right     4-5 yrs ago  . Eye surgery      cataracts removed-bilateral eyes  . Back surgery  2009    lower back  . Colon surgery  June 17, 2012    surgery for bleed  . Shoulder open rotator cuff repair  08/04/2012    Procedure: ROTATOR CUFF REPAIR SHOULDER OPEN;  Surgeon: Tobi Bastos, MD;  Location: [facility];  Service: Orthopedics;  Laterality: Left;  with Anchors and Graft  . Reduction mammaplasty Bilateral 1980    Prior to Admission medications   Medication Sig Start Date End Date Taking? Authorizing Provider  apixaban (ELIQUIS) 2.5 MG TABS tablet Take 1 tablet (2.5 mg total) by mouth 2 (two) times daily. 10/09/14   Belkys A Regalado, MD  benzonatate (TESSALON) 200 MG capsule Take 1 capsule (200 mg total) by mouth 2 (two) times daily as needed for cough. 10/09/14   Belkys A Regalado, MD  cyanocobalamin (,VITAMIN B-12,) 1000 MCG/ML injection Inject 1,000 mcg into the muscle every 30 (thirty) days.    Historical Provider, MD  diclofenac sodium (VOLTAREN) 1 % GEL Apply 2 g topically every morning.    Historical Provider, MD  dicyclomine (BENTYL) 10 MG capsule TAKE 1 CAPSULE BY MOUTH TWICE DAILY FOR STOMACH SPASMS AND CRAMPS 06/08/15   Amy S Esterwood, PA-C  feeding supplement, GLUCERNA SHAKE, (GLUCERNA SHAKE) LIQD Take 237 mLs by mouth 4 (four) times daily - after meals and at bedtime. 10/09/14   Belkys A Regalado, MD  furosemide (LASIX) 20 MG tablet Take 20 mg by mouth as needed.     Historical Provider, MD  hyoscyamine (LEVSIN SL) 0.125 MG SL tablet Place 1 tablet  (0.125 mg total) under the tongue every 6 (six) hours as needed for cramping. 03/03/15   Venetia Maxon Rama, MD  l-methylfolate-B6-B12 (METANX) 3-35-2 MG TABS Take 1 tablet by mouth daily.    Historical Provider, MD  levothyroxine (SYNTHROID, LEVOTHROID) 75 MCG tablet Take 75 mcg by mouth daily before breakfast.    Historical Provider, MD  LITE TOUCH LANCETS MISC daily.  04/17/15   Historical Provider, MD  losartan (COZAAR) 50 MG tablet Take 50 mg by mouth daily.    Historical Provider, MD  metFORMIN (GLUCOPHAGE) 500 MG tablet Take 1 tablet (500 mg total) by mouth 2 (two) times daily with a meal. 03/03/15   Venetia Maxon Rama, MD  mirabegron ER (MYRBETRIQ) 25 MG TB24 tablet Take 25 mg by mouth daily.    Historical Provider, MD  mirtazapine (REMERON) 15 MG tablet Take 15 mg by mouth at bedtime.    Historical Provider, MD  ondansetron (ZOFRAN) 4 MG tablet Take 1 tab daily every morning with breakfast for nausea. 05/01/15   Amy S Esterwood, PA-C  oxyCODONE (OXY IR/ROXICODONE) 5 MG immediate release tablet Take 2 tabs= 10 mg by mouth every 6 hours as needed for nausea. 03/06/15   Estill Dooms, MD  pantoprazole (PROTONIX) 40 MG tablet Take 40 mg by mouth daily.    Historical Provider, MD  PHARMACIST CHOICE NO CODING test strip 1 each by Other route as needed.  04/17/15   Historical Provider, MD  polyethylene glycol (MIRALAX / GLYCOLAX) packet Take 17 g by mouth daily. 03/03/15   Venetia Maxon Rama, MD  ropinirole (REQUIP) 5 MG tablet Take 5 mg by mouth at bedtime.    Historical Provider, MD    Scheduled Meds: . bisacodyl  10 mg Oral Once  . [START ON 06/13/2015] peg 3350 powder  0.5 kit Oral Once  . peg 3350 powder  0.5 kit Oral Once   Infusions:   PRN Meds:  Allergies as of 06/06/2015 - Review Complete 06/06/2015  Allergen Reaction Noted  . Sulfa antibiotics Nausea And Vomiting 03/31/2011  . Penicillins Hives and Rash 03/31/2011    Family History  Problem Relation Age of Onset  . Melanoma  Daughter     died age 71  . Melanoma Son     History   Social History  . Marital Status: Widowed    Spouse Name: N/A  . Number of Children: 2  . Years of Education: N/A   Occupational History  . Retired    Social History Main Topics  . Smoking status: Never Smoker   . Smokeless tobacco: Never Used  . Alcohol Use: No  . Drug Use: No  . Sexual Activity: No   Other Topics Concern  . Not on file   Social History Narrative    REVIEW OF SYSTEMS: Walks using cane or walker, no falls Some intermittent DOE, not worse than usual.  No cough.  No chest pain.  No dizziness.  No limb swelling. No unusual bleeding or bruising.   PHYSICAL EXAM: Vital signs in last 24 hours: Filed Vitals:   06/12/15 1410  BP: 134/42  Pulse: 64  Temp: 98 F (36.7 C)  Resp: 16   Wt Readings from Last 3 Encounters:  06/12/15 103 lb 13.4 oz (47.1 kg)  05/01/15 102 lb 4 oz (46.38 kg)  03/06/15 104 lb (47.174 kg)    General: pleasant, alert.  Comfortable.  Frail, pale.  Head:  No asymmetry or swellilng  Eyes:  No icterus or pallor  Ears:  HOH  Nose:  No discharge but sounds congested Mouth:  Clear and moist Neck:  No jvd, bruits or masses Lungs:  Clear bil.   Heart: RRR Abdomen:  Soft, slight diffuse discomfort, no guard or rebound.  No mass, no HSM, .   Rectal: deferred   Musc/Skeltl: no joint swelling.  Arthritic deformities in hands and fingers.  Kyphosis.  Extremities:  No CCE  Neurologic:  No tremor, no limb weakness Skin:  No telangectasia, sores or rashes Tattoos:  none Nodes:  No cervical adenopathy.    Psych:  Pleasant, relaxed.   Intake/Output from previous day:   Intake/Output this shift:    LAB RESULTS: No results for input(s): WBC, HGB, HCT, PLT in the last 72 hours. BMET Lab Results  Component Value Date   NA 139 05/01/2015   NA 138 03/01/2015   NA 138 02/28/2015   K 4.7 05/01/2015   K 4.3 03/01/2015   K 4.8 02/28/2015   CL 105 05/01/2015   CL 105  03/01/2015   CL 102 02/28/2015   CO2 28 05/01/2015   CO2 25 03/01/2015   CO2 26 02/28/2015   GLUCOSE 128* 05/01/2015   GLUCOSE 94 03/01/2015   GLUCOSE 130* 02/28/2015   BUN 33* 05/01/2015   BUN 27* 03/01/2015   BUN 39* 02/28/2015   CREATININE 1.00 05/01/2015   CREATININE 0.91 03/01/2015   CREATININE 1.11* 02/28/2015   CALCIUM 9.4 05/01/2015   CALCIUM 8.6 03/01/2015   CALCIUM 9.3 02/28/2015   LFT No results for input(s): PROT, ALBUMIN, AST, ALT, ALKPHOS, BILITOT, BILIDIR, IBILI in the last 72 hours. PT/INR Lab Results  Component Value Date   INR 1.13 10/02/2014   INR 1.13 04/03/2014   INR 1.09 04/03/2014   Hepatitis Panel No results for input(s): HEPBSAG, HCVAB, HEPAIGM, HEPBIGM in the last 72 hours. C-Diff No components found for: CDIFF Lipase     Component Value Date/Time  LIPASE 28.0 05/01/2015 1456     IMPRESSION:   *  Chronic abdominal pain, diarrhea, weight loss,  Rectal mass on exam of 6/29.  Admitted for bowel prep.  Colonoscopy set at 1130 AM tomorrow.  ? Crohn's disease in past.   *  Multiple medical problems: including long term anticoagulation for hx DVT.     PLAN:     *  Colonoscopy tomorrow.  Moviprep, split dose starts tonight.    Azucena Freed  06/12/2015, 2:36 PM Pager: [phone]  GI ATTENDING  As above. Just seen in office. Plans for colonoscopy tomorrow. Noted that there is some question regarding the last dose of Eliquis   N. Geri Seminole., M.D. [facility] Division of Gastroenterology

## 2015-06-13 NOTE — Progress Notes (Signed)
Contacted Dr.Brodie informed her that patient was able to complete 1 Liter of Movi-prep, with 1 Liter remaining. Per Dr.Brodie if patient stool remains cloudy at 7AM give 2 Ducolax tablets.

## 2015-06-13 NOTE — Anesthesia Preprocedure Evaluation (Addendum)
Anesthesia Evaluation  Patient identified by MRN, date of birth, ID band Patient awake    Reviewed: Allergy & Precautions, H&P , NPO status , Patient's Chart, lab work & pertinent test results  Airway Mallampati: II  TM Distance: >3 FB Neck ROM: full    Dental  (+) Missing, Dental Advisory Given Most of the front teeth are missing.  She says that the 3 front upper teeth present are not loose.:   Pulmonary sleep apnea , pneumonia -,  breath sounds clear to auscultation  Pulmonary exam normal       Cardiovascular Exercise Tolerance: Good hypertension, DVT - Peripheral Vascular Disease negative cardio ROS Normal cardiovascular exam+ dysrhythmias Rhythm:regular Rate:Normal     Neuro/Psych  Headaches, PSYCHIATRIC DISORDERS Anxiety Depression negative neurological ROS     GI/Hepatic Neg liver ROS, hiatal hernia, GERD-  Medicated and Controlled,  Endo/Other  diabetes, Well Controlled, Type 2, Oral Hypoglycemic AgentsHypothyroidism   Renal/GU negative Renal ROS     Musculoskeletal  (+) Arthritis -,   Abdominal   Peds  Hematology negative hematology ROS (+) anemia ,   Anesthesia Other Findings   Reproductive/Obstetrics negative OB ROS                             Anesthesia Physical  Anesthesia Plan  ASA: III  Anesthesia Plan: MAC   Post-op Pain Management:    Induction: Intravenous  Airway Management Planned:   Additional Equipment:   Intra-op Plan:   Post-operative Plan:   Informed Consent: I have reviewed the patients History and Physical, chart, labs and discussed the procedure including the risks, benefits and alternatives for the proposed anesthesia with the patient or authorized representative who has indicated his/her understanding and acceptance.   Dental advisory given  Plan Discussed with: CRNA  Anesthesia Plan Comments:        Anesthesia Quick Evaluation

## 2015-06-13 NOTE — Op Note (Signed)
[facility] [place], [id number]   COLONOSCOPY PROCEDURE REPORT  PATIENT: [name], [name]  MR#: [id number] BIRTHDATE: 09-18-1919 , 32  yrs. old GENDER: female ENDOSCOPIST: Eustace Quail, MD REFERRED BY:.  Self / Office PROCEDURE DATE:  06/13/2015 PROCEDURE:   Colonoscopy, diagnostic and Colonoscopy with biopsy First Screening Colonoscopy - Avg.  risk and is 50 yrs.  old or older - No.  Prior Negative Screening - Now for repeat screening. N/A  History of Adenoma - Now for follow-up colonoscopy & has been > or = to 3 yrs.  N/A  Polyps removed today? No Recommend repeat exam, <10 yrs? No ASA CLASS:   Class III INDICATIONS:Clinically significant diarrhea of unexplained origin.. Rectal examination in the office revealed a firm mass in the midportion of the rectum with Hemoccult-negative stool. Require clarification MEDICATIONS: no sedation . Unable to provide IV access  DESCRIPTION OF PROCEDURE:   After the risks benefits and alternatives of the procedure were thoroughly explained, informed consent was obtained.  The digital rectal exam revealed no abnormalities of the rectum.   The Pentax Adult Colon [id number] endoscope was introduced through the anus and advanced to the distal transverse colon. No adverse events experienced.   The quality of the prep was adequate (MoviPrep was used)  The instrument was then slowly withdrawn as the colon was fully examined. Estimated blood loss is zero unless otherwise noted in this procedure report.      COLON FINDINGS: The colonoscope was advanced to the level of the distal transverse colon.  It was not advanced further due to patient discomfort and the ability to obtain adequate clinical information.  Severe left-sided diverticulosis present.  Random colon biopsies taken to evaluate chronic diarrhea.  The rectum did NOT reveal any mass lesion or other abnormality.  Retroflexed views revealed no  abnormalities. The time to cecum =    Withdrawal time = The scope was withdrawn and the procedure completed. COMPLICATIONS: There were no immediate complications.  ENDOSCOPIC IMPRESSION: 1. Colonoscopy to the level of the transverse colon as described 2. The rectum did NOT reveal any mass lesion or other abnormality. Suspect firm mass felt in clinic was stool. She may have had overflow diarrhea 3. Severe left-sided diverticulosis  RECOMMENDATIONS: 1. Await biopsy results . We will contact the patient with the results 2. Okay to discharge home from GI perspective 3. We will arrange GI follow-up based on biopsies and clinical need  Results discussed with patient. She was provided a procedure report  eSigned:  Eustace Quail, MD 06/13/2015 12:37 PM   cc: Benito Mccreedy MD and The Patient

## 2015-06-13 NOTE — Progress Notes (Signed)
OT Cancellation Note  Patient Details Name: [name] MRN: [id number] DOB: 1919-08-12   Cancelled Treatment:    Reason Eval/Treat Not Completed: Patient at procedure or test/ unavailable (at endo)  Parke Poisson B 06/13/2015, 10:49 AM  Pager: [phone]

## 2015-06-13 NOTE — Progress Notes (Signed)
Initial Nutrition Assessment  DOCUMENTATION CODES:  Not applicable  INTERVENTION:  Glucerna Shake po TID, each supplement provides 220 kcal and 10 grams of protein  NUTRITION DIAGNOSIS:  Inadequate oral intake related to inability to eat as evidenced by NPO status.  GOAL:  Patient will meet greater than or equal to 90% of their needs   MONITOR:  PO intake, Supplement acceptance, Diet advancement, Labs, Weight trends, Skin, I & O's  REASON FOR ASSESSMENT:  Malnutrition Screening Tool, Consult Assessment of nutrition requirement/status  ASSESSMENT: [name] is a 81 y.o. female , single, lives alone, ambulates with the help of cane or walker, PMH of recurrent bilateral DVT, PE on Eliquis (last dose 06/11/15), HTN, DM 2, hypothyroid, overactive bladder, GERD, duodenitis, diverticulosis, probable Crohn's disease, hospitalized at [facility] 02/28/15-03/03/15 for vomiting which was felt to be related to gastritis in the setting of large hiatal hernia, viridans streptococcal UTI was electively brought to the hospital on 06/12/15 by [facility] for elective colonoscopy on 06/13/15. She has been having several weeks history of chronic abdominal pain-indicates epigastric region, intermittent, mild, no aggravating or relieving factors, intermittent and occasional nonbloody emesis, fluctuating appetite, 20 pound weight loss over undetermined timeframe, ongoing diarrhea-states one to 2 watery BMs per day with associated incontinence but no blood. This is associated with generalized weakness.  Pt admitted with rectal mass.   Pt was being taken down to endoscopy suite for colonoscopy at time of visit. Pt appeared very frail. Unable to perform nutrition-focused physical exam at this time.   Noted wt stability over the past year.   Suspect poor po intake due to abdominal pain and diarrhea; will supplement diet once advanced.   Addendum: Pt has been advanced to carb modified diet.Poor  intake noted (PO: 25%). Noted she is refusing Resource Breeze supplements. Will d/c and add Glucerna Shake.   Height:  Ht Readings from Last 1 Encounters:  06/12/15 5' (1.524 m)    Weight:  Wt Readings from Last 1 Encounters:  06/12/15 103 lb 13.4 oz (47.1 kg)    Ideal Body Weight:  45.5 kg  Wt Readings from Last 10 Encounters:  06/12/15 103 lb 13.4 oz (47.1 kg)  05/01/15 102 lb 4 oz (46.38 kg)  03/06/15 104 lb (47.174 kg)  03/02/15 106 lb (48.081 kg)  03/01/15 106 lb (48.081 kg)  10/08/14 106 lb 6.4 oz (48.263 kg)  08/02/14 103 lb (46.72 kg)  05/05/14 107 lb 9.6 oz (48.807 kg)  04/07/14 104 lb (47.174 kg)  04/06/14 110 lb 7.2 oz (50.1 kg)    BMI:  Body mass index is 20.28 kg/(m^2).  Estimated Nutritional Needs:  Kcal:  1400-1600  Protein:  55-65 grams  Fluid:  1.4-1.6 L  Skin:  Reviewed, no issues  Diet Order:  Diet Carb Modified Fluid consistency:: Thin; Room service appropriate?: Yes  EDUCATION NEEDS:  No education needs identified at this time   Intake/Output Summary (Last 24 hours) at 06/13/15 1547 Last data filed at 06/13/15 1357  Gross per 24 hour  Intake    360 ml  Output      0 ml  Net    360 ml    Last BM:  06/12/15  Toshia Larkin A. Jimmye Norman, RD, LDN, CDE Pager: [phone] After hours Pager: [phone]

## 2015-06-14 ENCOUNTER — Encounter: Payer: Self-pay | Admitting: Internal Medicine

## 2015-06-14 ENCOUNTER — Encounter (HOSPITAL_COMMUNITY): Payer: Self-pay | Admitting: Internal Medicine

## 2015-06-14 DIAGNOSIS — R627 Adult failure to thrive: Secondary | ICD-10-CM | POA: Diagnosis not present

## 2015-06-14 DIAGNOSIS — K5730 Diverticulosis of large intestine without perforation or abscess without bleeding: Secondary | ICD-10-CM | POA: Diagnosis not present

## 2015-06-14 DIAGNOSIS — R197 Diarrhea, unspecified: Secondary | ICD-10-CM | POA: Diagnosis not present

## 2015-06-14 DIAGNOSIS — Z86711 Personal history of pulmonary embolism: Secondary | ICD-10-CM | POA: Diagnosis not present

## 2015-06-14 DIAGNOSIS — I10 Essential (primary) hypertension: Secondary | ICD-10-CM | POA: Diagnosis not present

## 2015-06-14 LAB — BASIC METABOLIC PANEL
Anion gap: 9 (ref 5–15)
BUN: 23 mg/dL — ABNORMAL HIGH (ref 6–20)
CALCIUM: 8.9 mg/dL (ref 8.9–10.3)
CO2: 24 mmol/L (ref 22–32)
Chloride: 106 mmol/L (ref 101–111)
Creatinine, Ser: 0.94 mg/dL (ref 0.44–1.00)
GFR calc non Af Amer: 51 mL/min — ABNORMAL LOW (ref >60)
GFR, EST AFRICAN AMERICAN: 59 mL/min — AB (ref >60)
GLUCOSE: 100 mg/dL — AB (ref 65–99)
Potassium: 4.7 mmol/L (ref 3.5–5.1)
Sodium: 139 mmol/L (ref 135–145)

## 2015-06-14 LAB — CBC
HEMATOCRIT: 34.4 % — AB (ref 36.0–46.0)
HEMOGLOBIN: 11 g/dL — AB (ref 12.0–15.0)
MCH: 29.2 pg (ref 26.0–34.0)
MCHC: 32 g/dL (ref 30.0–36.0)
MCV: 91.2 fL (ref 78.0–100.0)
PLATELETS: 273 10*3/uL (ref 150–400)
RBC: 3.77 MIL/uL — AB (ref 3.87–5.11)
RDW: 13.7 % (ref 11.5–15.5)
WBC: 5.6 10*3/uL (ref 4.0–10.5)

## 2015-06-14 LAB — GLUCOSE, CAPILLARY
GLUCOSE-CAPILLARY: 125 mg/dL — AB (ref 65–99)
Glucose-Capillary: 86 mg/dL (ref 65–99)

## 2015-06-14 MED ORDER — FUROSEMIDE 20 MG PO TABS: 20.0000 mg | ORAL_TABLET | ORAL | Status: DC | PRN

## 2015-06-14 NOTE — Evaluation (Signed)
Occupational Therapy Evaluation Patient Details Name: [name] MRN: [id number] DOB: 1919/10/24 Today's Date: 06/14/2015    History of Present Illness Patient is a 81 y/o female with PMH of recurrent bilateral DVT, PE on Eliquis (last dose 06/11/15), HTN, DM 2, hypothyroid, overactive bladder, GERD, duodenitis, diverticulosis, probable Crohn's disease, hospitalized at [facility] 02/28/15-03/03/15 for vomiting which was felt to be related to gastritis in the setting of large hiatal hernia, viridans streptococcal UTI was electively brought to the hospital on 06/12/15 by [facility] for elective colonoscopy on 06/13/15.   Clinical Impression   Pt with decline in function and safety with ADLs and ADL mobility with decreased strength, balance and endurance. Pt would benefit from acute OT services to address impairments to increase level of function and safety     Follow Up Recommendations  Supervision - Intermittent;No OT follow up    Equipment Recommendations   none, pt has all necessary DME and A/E at home   Recommendations for Other Services       Precautions / Restrictions Precautions Precautions: Fall Precaution Comments: two remote falls couple of years past Restrictions Weight Bearing Restrictions: No      Mobility Bed Mobility         Supine to sit: Supervision     General bed mobility comments: up in recliner upon arrival  Transfers Overall transfer level: Needs assistance Equipment used: Rolling walker (2 wheeled) Transfers: Sit to/from Stand Sit to Stand: Min guard         General transfer comment: Minguard and increased effort to maintain balance. Cues for safety    Balance   Sitting-balance support: No upper extremity supported;Feet supported Sitting balance-Leahy Scale: Good     Standing balance support: During functional activity Standing balance-Leahy Scale: Fair                              ADL Overall ADL's : Needs  assistance/impaired     Grooming: Wash/dry hands;Wash/dry face;Min guard;Standing   Upper Body Bathing: Supervision/ safety;Set up;Sitting   Lower Body Bathing: Min guard   Upper Body Dressing : Supervision/safety;Set up;Sitting   Lower Body Dressing: Min guard   Toilet Transfer: RW;Comfort height toilet;Grab bars;Ambulation;Min guard   Toileting- Water quality scientist and Hygiene: Min guard;Sit to/from stand   Tub/ Banker: Civil engineer, contracting;Ambulation;Grab bars   Functional mobility during ADLs: Min guard;Rolling walker       Vision  wear glasses, no change from baseline              Pertinent Vitals/Pain Pain Assessment: No/denies pain     Hand Dominance Right   Extremity/Trunk Assessment Upper Extremity Assessment Upper Extremity Assessment: Generalized weakness;RUE deficits/detail RUE Deficits / Details: shoulder flexion decreased, 65 degrees           Communication Communication Communication: HOH   Cognition Arousal/Alertness: Awake/alert Behavior During Therapy: WFL for tasks assessed/performed Overall Cognitive Status: Within Functional Limits for tasks assessed                     General Comments   pt very pleasant and cooperative                 Home Living Family/patient expects to be discharged to:: Private residence Living Arrangements: Alone Available Help at Discharge: Personal care attendant;Available PRN/intermittently Type of Home: Other(Comment) (condo) Home Access: Stairs to enter Entrance Stairs-Number of Steps: 5 Entrance Stairs-Rails: Can reach  both;Left;Right Home Layout: One level     Bathroom Shower/Tub: Teacher, early years/pre: Handicapped height Bathroom Accessibility: Yes   Home Equipment: Walker - 2 wheels;Walker - 4 wheels;Cane - quad;Grab bars - tub/shower;Tub bench;Adaptive equipment Adaptive Equipment: Reacher Additional Comments: hired caregivers  MWF for a few hours; can come for  longer periods of time if needed      Prior Functioning/Environment Level of Independence: Independent with assistive device(s);Needs assistance  Gait / Transfers Assistance Needed: independent with RW ADL's / Homemaking Assistance Needed: caregiver supervises showers, cooks, cleans, laundry   Comments: Printmaker meals, supervises shower.    OT Diagnosis: Generalized weakness   OT Problem List: Decreased strength;Decreased activity tolerance;Impaired balance (sitting and/or standing)   OT Treatment/Interventions: Self-care/ADL training;Therapeutic exercise;Therapeutic activities    OT Goals(Current goals can be found in the care plan section) Acute Rehab OT Goals Patient Stated Goal: To go home OT Goal Formulation: With patient Time For Goal Achievement: 06/14/15 Potential to Achieve Goals: Good ADL Goals Pt Will Perform Grooming: with set-up;with modified independence;standing Pt Will Perform Upper Body Bathing: with set-up;with modified independence;sitting;standing Pt Will Perform Lower Body Bathing: with set-up;with modified independence;sit to/from stand;with supervision Pt Will Perform Upper Body Dressing: with set-up;with modified independence;sitting;standing Pt Will Perform Lower Body Dressing: with supervision;with set-up;with modified independence;sit to/from stand Pt Will Transfer to Toilet: with supervision;with modified independence;grab bars;ambulating;regular height toilet (3 in 1 over toilet) Pt Will Perform Toileting - Clothing Manipulation and hygiene: with supervision;with modified independence;sit to/from stand Pt Will Perform Tub/Shower Transfer: with supervision;with modified independence;shower seat;grab bars  OT Frequency: Min 2X/week   Barriers to D/C:    none                     End of Session Equipment Utilized During Treatment: Rolling walker;Other (comment) (shower seat)  Activity Tolerance: Patient tolerated treatment  well Patient left: in chair;with call bell/phone within reach   Time: [id number] OT Time Calculation (min): 26 min Charges:  OT General Charges $OT Visit: 1 Procedure OT Evaluation $Initial OT Evaluation Tier I: 1 Procedure OT Treatments $Therapeutic Activity: 8-22 mins G-Codes: OT G-codes **NOT FOR INPATIENT CLASS** Functional Limitation: Self care Self Care Current Status [id number]): At least 20 percent but less than 40 percent impaired, limited or restricted Self Care Goal Status [id number]): At least 1 percent but less than 20 percent impaired, limited or restricted  Britt Bottom 06/14/2015, 1:13 PM

## 2015-06-14 NOTE — Discharge Summary (Signed)
Discharge Summary  [name] KPT:[id number] DOB: 1919/11/26  PCP: Benito Mccreedy, MD  Admit date: 06/12/2015 Discharge date: 06/14/2015  Time spent: <62mns  Recommendations for Outpatient Follow-up:  1. F/u with PMD within a week for hospital follow up 2. F/u with gastroenterology for colonoscopy biopsy result  Discharge Diagnoses:  Active Hospital Problems   Diagnosis Date Noted  . Diarrhea   . Rectal mass   . Diverticulosis of colon without hemorrhage   . Abdominal pain 06/12/2015  . Abdominal pain, epigastric 10/01/2014  . Hx pulmonary embolism   . Type 2 diabetes mellitus 04/11/2014  . Anemia of chronic disease 04/03/2014  . Essential hypertension 03/09/2008    Resolved Hospital Problems   Diagnosis Date Noted Date Resolved  No resolved problems to display.    Discharge Condition: stable  Diet recommendation: heart healthy/carb modified  Filed Weights   06/12/15 1410 06/14/15 0500  Weight: 47.1 kg (103 lb 13.4 oz) 48.4 kg (106 lb 11.2 oz)    History of present illness:  [name] GHERARDIis a 81y.o. female , single, lives alone, ambulates with the help of cane or walker, PMH of recurrent bilateral DVT, PE on Eliquis (last dose 06/11/15), HTN, DM 2, hypothyroid, overactive bladder, GERD, duodenitis, diverticulosis, probable Crohn's disease, [facility] for vomiting which was felt to be related to gastritis in the setting of large hiatal hernia, viridans streptococcal UTI was electively brought to the hospital on 06/12/15 by [facility] for elective colonoscopy on 06/13/15. She has been having several weeks history of chronic abdominal pain-indicates epigastric region, intermittent, mild, no aggravating or relieving factors, intermittent and occasional nonbloody emesis, fluctuating appetite, 20 pound weight loss over undetermined timeframe, ongoing diarrhea-states one to 2 watery BMs per day with associated incontinence but no blood.  This is associated with generalized weakness. Seen by Dr. PHenrene Pastorin the office on 6/29 who noted apparent rectal mass on physical exam. Concern for overflow diarrhea if this is carcinoma. Needs endoscopic evaluation but given her high risk from advanced age and comorbidities, it was determined that the procedure needed to be performed in the hospital with hospitalization day prior to procedure for bowel prep. Hospitalist admission was requested.   Hospital Course:  Active Problems:   Essential hypertension   Anemia of chronic disease   Type 2 diabetes mellitus   Hx pulmonary embolism   Abdominal pain, epigastric   Abdominal pain   Diarrhea   Rectal mass   Diverticulosis of colon without hemorrhage  Chronic abdominal pain, decreased appetite, weight loss, chronic diarrhea with incontinence and possible rectal mass - [facility] consultation appreciated. s/p colonoscopy 7/6. No mass, no malignancy. Does has left sided diverticulosis, rectal mass felt to be hard stool, diarrhea felt to be overflow diarrhea due to constipation. resume Eliquis. - outpatient GI follow up.  Essential hypertension - Controlled on home Cozaar  Type II DM - Hold metformin and Placed on NovoLog SSI while in the hospital -metformin resumed at discharge  History of recurrent VTE/h/o PAF - Eliquis being held for colonoscopy 06/13/15. Last dose per pharmacy interview with patient 06/11/15 - SCDs -resume eliquis post colonoscopy.  Anemia of chronic disease - hgb stable at baseline  Hypothyroid - Continue Synthroid, tsh wnl,   Overactive bladder - Continue Myrbetric  GERD - PPI  Severe malnutrition - Nutrition consult and continue supplements  FTT: PT/OT/RN/social worker/home health, patient continue to live by herself, son and grandaughter lives close by. Will likely need  snf placement due to progressive weakness and FTT.     Code Status: DO NOT RESUSCITATE. Confirmed with patient with admitting nurse at  bedside.  Family Communication: None at bedside.  Disposition Plan: DC home with [facility]     Consultants:  [facility]  Procedures:  Colonoscopy 7/6  Antibiotics:  none  Discharge Exam: BP 137/73 mmHg  Pulse 67  Temp(Src) 97.6 F (36.4 C) (Oral)  Resp 17  Ht 5' (1.524 m)  Wt 48.4 kg (106 lb 11.2 oz)  BMI 20.84 kg/m2  SpO2 100%  General: frail, nad Cardiovascular: RRR Respiratory: CTABL  Discharge Instructions You were cared for by a hospitalist during your hospital stay. If you have any questions about your discharge medications or the care you received while you were in the hospital after you are discharged, you can call the unit and asked to speak with the hospitalist on call if the hospitalist that took care of you is not available. Once you are discharged, your primary care physician will handle any further medical issues. Please note that NO REFILLS for any discharge medications will be authorized once you are discharged, as it is imperative that you return to your primary care physician (or establish a relationship with a primary care physician if you do not have one) for your aftercare needs so that they can reassess your need for medications and monitor your lab values.  Discharge Instructions    Diet - low sodium heart healthy    Complete by:  As directed      Face-to-face encounter (required for Medicare/Medicaid patients)    Complete by:  As directed   I Linda Biehn certify that this patient is under my care and that I, or a nurse practitioner or physician's assistant working with me, had a face-to-face encounter that meets the physician face-to-face encounter requirements with this patient on 06/13/2015. The encounter with the patient was in whole, or in part for the following medical condition(s) which is the primary reason for home health care (List medical condition): FTT  The encounter with the patient was in whole, or in part, for the following medical condition, which  is the primary reason for home health care:  FTT  I certify that, based on my findings, the following services are medically necessary home health services:  Physical therapy  Reason for Medically Necessary Home Health Services:  Skilled Nursing- Change/Decline in Patient Status  My clinical findings support the need for the above services:  Unable to leave home safely without assistance and/or assistive device  Further, I certify that my clinical findings support that this patient is homebound due to:  Unable to leave home safely without assistance     Home Health    Complete by:  As directed   To provide the following care/treatments:   PT [place] RN Social work       Increase activity slowly    Complete by:  As directed             Medication List    TAKE these medications        apixaban 2.5 MG Tabs tablet  Commonly known as:  ELIQUIS  Take 1 tablet (2.5 mg total) by mouth 2 (two) times daily.     CLEVER CHEK AUTO-CODE VOICE Devi  USE TO TEST BLOOD GLUCOSE     cyanocobalamin 1000 MCG/ML injection  Commonly known as:  (VITAMIN B-12)  Inject 1,000 mcg into the muscle every 30 (thirty) days.  diclofenac sodium 1 % Gel  Commonly known as:  VOLTAREN  Apply 2 g topically every morning.     dicyclomine 10 MG capsule  Commonly known as:  BENTYL  TAKE 1 CAPSULE BY MOUTH TWICE DAILY FOR STOMACH SPASMS AND CRAMPS     feeding supplement (GLUCERNA SHAKE) Liqd  Take 237 mLs by mouth 4 (four) times daily - after meals and at bedtime.     furosemide 20 MG tablet  Commonly known as:  LASIX  Take 1 tablet (20 mg total) by mouth every 3 (three) days as needed for fluid or edema.     hyoscyamine 0.125 MG SL tablet  Commonly known as:  LEVSIN SL  Place 1 tablet (0.125 mg total) under the tongue every 6 (six) hours as needed for cramping.     l-methylfolate-B6-B12 3-35-2 MG Tabs  Commonly known as:  METANX  Take 1 tablet by mouth daily.     levothyroxine 75 MCG  tablet  Commonly known as:  SYNTHROID, LEVOTHROID  Take 75 mcg by mouth daily before breakfast.     LITE TOUCH LANCETS Misc  daily.     losartan 50 MG tablet  Commonly known as:  COZAAR  Take 50 mg by mouth daily.     metFORMIN 500 MG tablet  Commonly known as:  GLUCOPHAGE  Take 1 tablet (500 mg total) by mouth 2 (two) times daily with a meal.     mirabegron ER 25 MG Tb24 tablet  Commonly known as:  MYRBETRIQ  Take 25 mg by mouth daily.     mirtazapine 15 MG tablet  Commonly known as:  REMERON  Take 15 mg by mouth at bedtime.     ondansetron 4 MG tablet  Commonly known as:  ZOFRAN  Take 1 tab daily every morning with breakfast for nausea.     oxyCODONE 5 MG immediate release tablet  Commonly known as:  Oxy IR/ROXICODONE  Take 2 tabs= 10 mg by mouth every 6 hours as needed for nausea.     pantoprazole 40 MG tablet  Commonly known as:  PROTONIX  Take 40 mg by mouth daily.     PHARMACIST CHOICE NO CODING test strip  Generic drug:  glucose blood  1 each by Other route as needed.     polyethylene glycol packet  Commonly known as:  MIRALAX / GLYCOLAX  Take 17 g by mouth daily.     ropinirole 5 MG tablet  Commonly known as:  REQUIP  Take 5 mg by mouth at bedtime.     TAI DOC CONTROL NORMAL Soln  USE TO PERIODICALLY CHECK GLUCOSE MONITOR ACCORDING TO THE MANUAL       Allergies  Allergen Reactions  . Sulfa Antibiotics Nausea And Vomiting  . Penicillins Hives and Rash       Follow-up Information    Follow up with OSEI-BONSU,GEORGE, MD In 1 week.   Specialty:  Internal Medicine   Why:  hospital discharge follow up, continue monitor tsh level   Contact information:   [place] [address] [phone]       Follow up with Scarlette Shorts, MD In 1 month.   Specialty:  Gastroenterology   Why:  s/p colonoscopy, f/u with biopsy result   Contact information:   [address] [place] [address] [phone]        The results of  significant diagnostics from this hospitalization (including imaging, microbiology, ancillary and laboratory) are listed below for reference.  Significant Diagnostic Studies: No results found.  Microbiology: No results found for this or any previous visit (from the past 240 hour(s)).   Labs: Basic Metabolic Panel:  Recent Labs Lab 06/12/15 1600 06/13/15 0406 06/14/15 0512  NA 139 142 139  K 5.3* 4.4 4.7  CL 106 109 106  CO2 _0 GLUCOSE 82 76 100*  BUN 29* 22* 23*  CREATININE 0.88 0.86 0.94  CALCIUM 9.1 9.2 8.9   Liver Function Tests:  Recent Labs Lab 06/12/15 1600  AST 26  ALT 10*  ALKPHOS 77  BILITOT 0.4  PROT 5.8*  ALBUMIN 2.9*   No results for input(s): LIPASE, AMYLASE in the last 168 hours. No results for input(s): AMMONIA in the last 168 hours. CBC:  Recent Labs Lab 06/12/15 1600 06/13/15 0406 06/14/15 0512  WBC 4.4 4.4 5.6  HGB 10.2* 11.6* 11.0*  HCT 32.9* 36.0 34.4*  MCV 91.6 92.1 91.2  PLT 218 240 273   Cardiac Enzymes: No results for input(s): CKTOTAL, CKMB, CKMBINDEX, TROPONINI in the last 168 hours. BNP: BNP (last 3 results) No results for input(s): BNP in the last 8760 hours.  ProBNP (last 3 results) No results for input(s): PROBNP in the last 8760 hours.  CBG:  Recent Labs Lab 06/13/15 0747 06/13/15 1313 06/13/15 1653 06/13/15 2211 06/14/15 0759  GLUCAP 91 89 144* 138* 86       Signed:  Tanishia Lemaster MD, PhD  [facility] 06/14/2015, 9:49 AM

## 2015-06-14 NOTE — Progress Notes (Signed)
Physical Therapy Treatment Patient Details Name: [name] MRN: [id number] DOB: 08-15-1919 Today's Date: 06/14/2015    History of Present Illness Patient is a 81 y/o female with PMH of recurrent bilateral DVT, PE on Eliquis (last dose 06/11/15), HTN, DM 2, hypothyroid, overactive bladder, GERD, duodenitis, diverticulosis, probable Crohn's disease, hospitalized at [facility]/28/15-03/03/15 for vomiting which was felt to be related to gastritis in the setting of large hiatal hernia, viridans streptococcal UTI was electively brought to the hospital on 06/12/15 by [facility] for elective colonoscopy on 06/13/15.    PT Comments    Patient agreeable to therapy. Patient incontinent in the bed and required A for pericare. Patients insurance will not cover SNF at this time. Patient does have aid and is being set up with [place] [place], [place], [place] and HHCSW. Recommended to patient that she have as much assist as she can get once home. She has family in town that can help. Will follow up tomorrow to ensure safe mobility and to answer any follow up questions.   Follow Up Recommendations  Home health PT;Supervision - Intermittent     Equipment Recommendations  None recommended by PT    Recommendations for Other Services       Precautions / Restrictions Precautions Precautions: Fall Precaution Comments: two remote falls couple of years past    Mobility  Bed Mobility         Supine to sit: Supervision        Transfers Overall transfer level: Needs assistance Equipment used: Rolling walker (2 wheeled)   Sit to Stand: Min guard         General transfer comment: Minguard and increased effort to maintain balance. Cues for safety  Ambulation/Gait Ambulation/Gait assistance: Min guard Ambulation Distance (Feet): 100 Feet Assistive device: Quad cane Gait Pattern/deviations: Step-through pattern;Decreased stride length;Narrow base of support;Trunk flexed     General Gait  Details: Patient with safe use of her quad cane. A little unsteady but no LOB. Noted weakness   Stairs            Wheelchair Mobility    Modified Rankin (Stroke Patients Only)       Balance                                    Cognition Arousal/Alertness: Awake/alert Behavior During Therapy: WFL for tasks assessed/performed Overall Cognitive Status: Within Functional Limits for tasks assessed                      Exercises      General Comments        Pertinent Vitals/Pain Pain Assessment: No/denies pain    Home Living                      Prior Function            PT Goals (current goals can now be found in the care plan section) Progress towards PT goals: Progressing toward goals    Frequency  Min 3X/week    PT Plan Current plan remains appropriate    Co-evaluation             End of Session Equipment Utilized During Treatment: Gait belt Activity Tolerance: Patient tolerated treatment well Patient left: in chair;with call bell/phone within reach     Time: 0927-0951 PT Time Calculation (min) (ACUTE ONLY): 24  min  Charges:  $Gait Training: 8-22 mins $Therapeutic Activity: 8-22 mins                    G Codes:      Jacqualyn Posey 06/14/2015, 10:50 AM  06/14/2015 Jacqualyn Posey PTA [phone] pager [phone] office

## 2015-06-14 NOTE — Care Management Note (Addendum)
Case Management Note  Patient Details  Name: [name] MRN: [id number] Date of Birth: Jun 23, 1919  Subjective/Objective:                    Action/Plan: Patient states she just had [facility] home health and would like [facility] back same PT /OT request made. Patient has cane and walker and does not want 3 in 1 .  Expected Discharge Date:                  Expected Discharge Plan:  [place]  In-House Referral:     Discharge planning Services  CM Consult  Post Acute Care Choice:  Home Health Choice offered to:  Patient  DME Arranged:    DME Agency:     HH Arranged:  PT/OT aide, RN , SW HH Agency:  [facility]  Status of Service:  Completed, signed off  Medicare Important Message Given:    Date Medicare IM Given:    Medicare IM give by:    Date Additional Medicare IM Given:    Additional Medicare Important Message give by:     If discussed at [place] of Stay Meetings, dates discussed:    Additional Comments:  Marilu Favre, RN 06/14/2015, 8:27 AM

## 2015-06-14 NOTE — Clinical Social Work Note (Signed)
CSW received referral for SNF.  Case discussed with case manager and patient, plan is to discharge home with home health.  CSW to sign off please re-consult if social work needs arise.  Jones Broom. [place]ana, MSW, [place]

## 2015-06-18 ENCOUNTER — Other Ambulatory Visit: Payer: Self-pay | Admitting: Physician Assistant

## 2015-06-18 DIAGNOSIS — E43 Unspecified severe protein-calorie malnutrition: Secondary | ICD-10-CM | POA: Diagnosis not present

## 2015-06-18 DIAGNOSIS — E119 Type 2 diabetes mellitus without complications: Secondary | ICD-10-CM | POA: Diagnosis not present

## 2015-06-18 DIAGNOSIS — M353 Polymyalgia rheumatica: Secondary | ICD-10-CM | POA: Diagnosis not present

## 2015-06-18 DIAGNOSIS — M1990 Unspecified osteoarthritis, unspecified site: Secondary | ICD-10-CM | POA: Diagnosis not present

## 2015-06-18 DIAGNOSIS — K5730 Diverticulosis of large intestine without perforation or abscess without bleeding: Secondary | ICD-10-CM | POA: Diagnosis not present

## 2015-06-18 DIAGNOSIS — G609 Hereditary and idiopathic neuropathy, unspecified: Secondary | ICD-10-CM | POA: Diagnosis not present

## 2015-06-20 DIAGNOSIS — E119 Type 2 diabetes mellitus without complications: Secondary | ICD-10-CM | POA: Diagnosis not present

## 2015-06-20 DIAGNOSIS — M1990 Unspecified osteoarthritis, unspecified site: Secondary | ICD-10-CM | POA: Diagnosis not present

## 2015-06-20 DIAGNOSIS — M353 Polymyalgia rheumatica: Secondary | ICD-10-CM | POA: Diagnosis not present

## 2015-06-20 DIAGNOSIS — E43 Unspecified severe protein-calorie malnutrition: Secondary | ICD-10-CM | POA: Diagnosis not present

## 2015-06-20 DIAGNOSIS — G609 Hereditary and idiopathic neuropathy, unspecified: Secondary | ICD-10-CM | POA: Diagnosis not present

## 2015-06-20 DIAGNOSIS — K5730 Diverticulosis of large intestine without perforation or abscess without bleeding: Secondary | ICD-10-CM | POA: Diagnosis not present

## 2015-06-21 ENCOUNTER — Telehealth: Payer: Self-pay | Admitting: Internal Medicine

## 2015-06-21 DIAGNOSIS — K5730 Diverticulosis of large intestine without perforation or abscess without bleeding: Secondary | ICD-10-CM | POA: Diagnosis not present

## 2015-06-21 DIAGNOSIS — M353 Polymyalgia rheumatica: Secondary | ICD-10-CM | POA: Diagnosis not present

## 2015-06-21 DIAGNOSIS — M1990 Unspecified osteoarthritis, unspecified site: Secondary | ICD-10-CM | POA: Diagnosis not present

## 2015-06-21 DIAGNOSIS — E119 Type 2 diabetes mellitus without complications: Secondary | ICD-10-CM | POA: Diagnosis not present

## 2015-06-21 DIAGNOSIS — E43 Unspecified severe protein-calorie malnutrition: Secondary | ICD-10-CM | POA: Diagnosis not present

## 2015-06-21 DIAGNOSIS — G609 Hereditary and idiopathic neuropathy, unspecified: Secondary | ICD-10-CM | POA: Diagnosis not present

## 2015-06-21 NOTE — Telephone Encounter (Signed)
Dr. Henrene Pastor will not sign the orders, these need to be signed by the pts PCP.

## 2015-06-25 DIAGNOSIS — K5730 Diverticulosis of large intestine without perforation or abscess without bleeding: Secondary | ICD-10-CM | POA: Diagnosis not present

## 2015-06-25 DIAGNOSIS — M353 Polymyalgia rheumatica: Secondary | ICD-10-CM | POA: Diagnosis not present

## 2015-06-25 DIAGNOSIS — G609 Hereditary and idiopathic neuropathy, unspecified: Secondary | ICD-10-CM | POA: Diagnosis not present

## 2015-06-25 DIAGNOSIS — E119 Type 2 diabetes mellitus without complications: Secondary | ICD-10-CM | POA: Diagnosis not present

## 2015-06-25 DIAGNOSIS — E43 Unspecified severe protein-calorie malnutrition: Secondary | ICD-10-CM | POA: Diagnosis not present

## 2015-06-25 DIAGNOSIS — M1990 Unspecified osteoarthritis, unspecified site: Secondary | ICD-10-CM | POA: Diagnosis not present

## 2015-06-26 DIAGNOSIS — M353 Polymyalgia rheumatica: Secondary | ICD-10-CM | POA: Diagnosis not present

## 2015-06-26 DIAGNOSIS — E119 Type 2 diabetes mellitus without complications: Secondary | ICD-10-CM | POA: Diagnosis not present

## 2015-06-26 DIAGNOSIS — E43 Unspecified severe protein-calorie malnutrition: Secondary | ICD-10-CM | POA: Diagnosis not present

## 2015-06-26 DIAGNOSIS — M1990 Unspecified osteoarthritis, unspecified site: Secondary | ICD-10-CM | POA: Diagnosis not present

## 2015-06-26 DIAGNOSIS — K5730 Diverticulosis of large intestine without perforation or abscess without bleeding: Secondary | ICD-10-CM | POA: Diagnosis not present

## 2015-06-26 DIAGNOSIS — G609 Hereditary and idiopathic neuropathy, unspecified: Secondary | ICD-10-CM | POA: Diagnosis not present

## 2015-06-27 DIAGNOSIS — M1990 Unspecified osteoarthritis, unspecified site: Secondary | ICD-10-CM | POA: Diagnosis not present

## 2015-06-27 DIAGNOSIS — K5730 Diverticulosis of large intestine without perforation or abscess without bleeding: Secondary | ICD-10-CM | POA: Diagnosis not present

## 2015-06-27 DIAGNOSIS — E43 Unspecified severe protein-calorie malnutrition: Secondary | ICD-10-CM | POA: Diagnosis not present

## 2015-06-27 DIAGNOSIS — G609 Hereditary and idiopathic neuropathy, unspecified: Secondary | ICD-10-CM | POA: Diagnosis not present

## 2015-06-27 DIAGNOSIS — M353 Polymyalgia rheumatica: Secondary | ICD-10-CM | POA: Diagnosis not present

## 2015-06-27 DIAGNOSIS — E119 Type 2 diabetes mellitus without complications: Secondary | ICD-10-CM | POA: Diagnosis not present

## 2015-06-28 DIAGNOSIS — H6123 Impacted cerumen, bilateral: Secondary | ICD-10-CM | POA: Diagnosis not present

## 2015-06-28 DIAGNOSIS — J387 Other diseases of larynx: Secondary | ICD-10-CM | POA: Diagnosis not present

## 2015-06-28 DIAGNOSIS — R499 Unspecified voice and resonance disorder: Secondary | ICD-10-CM | POA: Diagnosis not present

## 2015-07-03 DIAGNOSIS — E119 Type 2 diabetes mellitus without complications: Secondary | ICD-10-CM | POA: Diagnosis not present

## 2015-07-03 DIAGNOSIS — G609 Hereditary and idiopathic neuropathy, unspecified: Secondary | ICD-10-CM | POA: Diagnosis not present

## 2015-07-03 DIAGNOSIS — K5730 Diverticulosis of large intestine without perforation or abscess without bleeding: Secondary | ICD-10-CM | POA: Diagnosis not present

## 2015-07-03 DIAGNOSIS — E43 Unspecified severe protein-calorie malnutrition: Secondary | ICD-10-CM | POA: Diagnosis not present

## 2015-07-03 DIAGNOSIS — M353 Polymyalgia rheumatica: Secondary | ICD-10-CM | POA: Diagnosis not present

## 2015-07-03 DIAGNOSIS — M1990 Unspecified osteoarthritis, unspecified site: Secondary | ICD-10-CM | POA: Diagnosis not present

## 2015-07-05 DIAGNOSIS — M353 Polymyalgia rheumatica: Secondary | ICD-10-CM | POA: Diagnosis not present

## 2015-07-05 DIAGNOSIS — M1990 Unspecified osteoarthritis, unspecified site: Secondary | ICD-10-CM | POA: Diagnosis not present

## 2015-07-05 DIAGNOSIS — E43 Unspecified severe protein-calorie malnutrition: Secondary | ICD-10-CM | POA: Diagnosis not present

## 2015-07-05 DIAGNOSIS — K5730 Diverticulosis of large intestine without perforation or abscess without bleeding: Secondary | ICD-10-CM | POA: Diagnosis not present

## 2015-07-05 DIAGNOSIS — G609 Hereditary and idiopathic neuropathy, unspecified: Secondary | ICD-10-CM | POA: Diagnosis not present

## 2015-07-05 DIAGNOSIS — E119 Type 2 diabetes mellitus without complications: Secondary | ICD-10-CM | POA: Diagnosis not present

## 2015-07-11 DIAGNOSIS — M1990 Unspecified osteoarthritis, unspecified site: Secondary | ICD-10-CM | POA: Diagnosis not present

## 2015-07-11 DIAGNOSIS — E43 Unspecified severe protein-calorie malnutrition: Secondary | ICD-10-CM | POA: Diagnosis not present

## 2015-07-11 DIAGNOSIS — E119 Type 2 diabetes mellitus without complications: Secondary | ICD-10-CM | POA: Diagnosis not present

## 2015-07-11 DIAGNOSIS — M353 Polymyalgia rheumatica: Secondary | ICD-10-CM | POA: Diagnosis not present

## 2015-07-11 DIAGNOSIS — K5730 Diverticulosis of large intestine without perforation or abscess without bleeding: Secondary | ICD-10-CM | POA: Diagnosis not present

## 2015-07-11 DIAGNOSIS — G609 Hereditary and idiopathic neuropathy, unspecified: Secondary | ICD-10-CM | POA: Diagnosis not present

## 2015-07-12 DIAGNOSIS — K5730 Diverticulosis of large intestine without perforation or abscess without bleeding: Secondary | ICD-10-CM | POA: Diagnosis not present

## 2015-07-12 DIAGNOSIS — E119 Type 2 diabetes mellitus without complications: Secondary | ICD-10-CM | POA: Diagnosis not present

## 2015-07-12 DIAGNOSIS — M1990 Unspecified osteoarthritis, unspecified site: Secondary | ICD-10-CM | POA: Diagnosis not present

## 2015-07-12 DIAGNOSIS — M353 Polymyalgia rheumatica: Secondary | ICD-10-CM | POA: Diagnosis not present

## 2015-07-12 DIAGNOSIS — G609 Hereditary and idiopathic neuropathy, unspecified: Secondary | ICD-10-CM | POA: Diagnosis not present

## 2015-07-12 DIAGNOSIS — E43 Unspecified severe protein-calorie malnutrition: Secondary | ICD-10-CM | POA: Diagnosis not present

## 2015-07-13 DIAGNOSIS — G629 Polyneuropathy, unspecified: Secondary | ICD-10-CM | POA: Diagnosis not present

## 2015-07-13 DIAGNOSIS — G8929 Other chronic pain: Secondary | ICD-10-CM | POA: Diagnosis not present

## 2015-07-13 DIAGNOSIS — E039 Hypothyroidism, unspecified: Secondary | ICD-10-CM | POA: Diagnosis not present

## 2015-07-13 DIAGNOSIS — E538 Deficiency of other specified B group vitamins: Secondary | ICD-10-CM | POA: Diagnosis not present

## 2015-07-13 DIAGNOSIS — E1140 Type 2 diabetes mellitus with diabetic neuropathy, unspecified: Secondary | ICD-10-CM | POA: Diagnosis not present

## 2015-07-13 DIAGNOSIS — F329 Major depressive disorder, single episode, unspecified: Secondary | ICD-10-CM | POA: Diagnosis not present

## 2015-07-13 DIAGNOSIS — I10 Essential (primary) hypertension: Secondary | ICD-10-CM | POA: Diagnosis not present

## 2015-07-13 DIAGNOSIS — G2581 Restless legs syndrome: Secondary | ICD-10-CM | POA: Diagnosis not present

## 2015-07-17 DIAGNOSIS — M1990 Unspecified osteoarthritis, unspecified site: Secondary | ICD-10-CM | POA: Diagnosis not present

## 2015-07-17 DIAGNOSIS — M353 Polymyalgia rheumatica: Secondary | ICD-10-CM | POA: Diagnosis not present

## 2015-07-17 DIAGNOSIS — E43 Unspecified severe protein-calorie malnutrition: Secondary | ICD-10-CM | POA: Diagnosis not present

## 2015-07-17 DIAGNOSIS — E119 Type 2 diabetes mellitus without complications: Secondary | ICD-10-CM | POA: Diagnosis not present

## 2015-07-17 DIAGNOSIS — G609 Hereditary and idiopathic neuropathy, unspecified: Secondary | ICD-10-CM | POA: Diagnosis not present

## 2015-07-17 DIAGNOSIS — K5730 Diverticulosis of large intestine without perforation or abscess without bleeding: Secondary | ICD-10-CM | POA: Diagnosis not present

## 2015-07-19 DIAGNOSIS — E538 Deficiency of other specified B group vitamins: Secondary | ICD-10-CM | POA: Diagnosis not present

## 2015-07-19 DIAGNOSIS — M353 Polymyalgia rheumatica: Secondary | ICD-10-CM | POA: Diagnosis not present

## 2015-07-19 DIAGNOSIS — K5730 Diverticulosis of large intestine without perforation or abscess without bleeding: Secondary | ICD-10-CM | POA: Diagnosis not present

## 2015-07-19 DIAGNOSIS — E039 Hypothyroidism, unspecified: Secondary | ICD-10-CM | POA: Diagnosis not present

## 2015-07-19 DIAGNOSIS — M1990 Unspecified osteoarthritis, unspecified site: Secondary | ICD-10-CM | POA: Diagnosis not present

## 2015-07-19 DIAGNOSIS — I10 Essential (primary) hypertension: Secondary | ICD-10-CM | POA: Diagnosis not present

## 2015-07-19 DIAGNOSIS — J45909 Unspecified asthma, uncomplicated: Secondary | ICD-10-CM | POA: Diagnosis not present

## 2015-07-19 DIAGNOSIS — G629 Polyneuropathy, unspecified: Secondary | ICD-10-CM | POA: Diagnosis not present

## 2015-07-19 DIAGNOSIS — E119 Type 2 diabetes mellitus without complications: Secondary | ICD-10-CM | POA: Diagnosis not present

## 2015-07-19 DIAGNOSIS — E1140 Type 2 diabetes mellitus with diabetic neuropathy, unspecified: Secondary | ICD-10-CM | POA: Diagnosis not present

## 2015-07-19 DIAGNOSIS — E43 Unspecified severe protein-calorie malnutrition: Secondary | ICD-10-CM | POA: Diagnosis not present

## 2015-07-19 DIAGNOSIS — F329 Major depressive disorder, single episode, unspecified: Secondary | ICD-10-CM | POA: Diagnosis not present

## 2015-07-19 DIAGNOSIS — G8929 Other chronic pain: Secondary | ICD-10-CM | POA: Diagnosis not present

## 2015-07-19 DIAGNOSIS — G2581 Restless legs syndrome: Secondary | ICD-10-CM | POA: Diagnosis not present

## 2015-07-19 DIAGNOSIS — G609 Hereditary and idiopathic neuropathy, unspecified: Secondary | ICD-10-CM | POA: Diagnosis not present

## 2015-07-24 DIAGNOSIS — G609 Hereditary and idiopathic neuropathy, unspecified: Secondary | ICD-10-CM | POA: Diagnosis not present

## 2015-07-24 DIAGNOSIS — E43 Unspecified severe protein-calorie malnutrition: Secondary | ICD-10-CM | POA: Diagnosis not present

## 2015-07-24 DIAGNOSIS — M353 Polymyalgia rheumatica: Secondary | ICD-10-CM | POA: Diagnosis not present

## 2015-07-24 DIAGNOSIS — E119 Type 2 diabetes mellitus without complications: Secondary | ICD-10-CM | POA: Diagnosis not present

## 2015-07-24 DIAGNOSIS — K5730 Diverticulosis of large intestine without perforation or abscess without bleeding: Secondary | ICD-10-CM | POA: Diagnosis not present

## 2015-07-24 DIAGNOSIS — M1990 Unspecified osteoarthritis, unspecified site: Secondary | ICD-10-CM | POA: Diagnosis not present

## 2015-07-26 DIAGNOSIS — M353 Polymyalgia rheumatica: Secondary | ICD-10-CM | POA: Diagnosis not present

## 2015-07-26 DIAGNOSIS — E119 Type 2 diabetes mellitus without complications: Secondary | ICD-10-CM | POA: Diagnosis not present

## 2015-07-26 DIAGNOSIS — G609 Hereditary and idiopathic neuropathy, unspecified: Secondary | ICD-10-CM | POA: Diagnosis not present

## 2015-07-26 DIAGNOSIS — E43 Unspecified severe protein-calorie malnutrition: Secondary | ICD-10-CM | POA: Diagnosis not present

## 2015-07-26 DIAGNOSIS — K5730 Diverticulosis of large intestine without perforation or abscess without bleeding: Secondary | ICD-10-CM | POA: Diagnosis not present

## 2015-07-26 DIAGNOSIS — M1990 Unspecified osteoarthritis, unspecified site: Secondary | ICD-10-CM | POA: Diagnosis not present

## 2015-07-31 DIAGNOSIS — E43 Unspecified severe protein-calorie malnutrition: Secondary | ICD-10-CM | POA: Diagnosis not present

## 2015-07-31 DIAGNOSIS — G609 Hereditary and idiopathic neuropathy, unspecified: Secondary | ICD-10-CM | POA: Diagnosis not present

## 2015-07-31 DIAGNOSIS — M1990 Unspecified osteoarthritis, unspecified site: Secondary | ICD-10-CM | POA: Diagnosis not present

## 2015-07-31 DIAGNOSIS — K5730 Diverticulosis of large intestine without perforation or abscess without bleeding: Secondary | ICD-10-CM | POA: Diagnosis not present

## 2015-07-31 DIAGNOSIS — E119 Type 2 diabetes mellitus without complications: Secondary | ICD-10-CM | POA: Diagnosis not present

## 2015-07-31 DIAGNOSIS — M353 Polymyalgia rheumatica: Secondary | ICD-10-CM | POA: Diagnosis not present

## 2015-08-02 DIAGNOSIS — M353 Polymyalgia rheumatica: Secondary | ICD-10-CM | POA: Diagnosis not present

## 2015-08-02 DIAGNOSIS — G609 Hereditary and idiopathic neuropathy, unspecified: Secondary | ICD-10-CM | POA: Diagnosis not present

## 2015-08-02 DIAGNOSIS — E43 Unspecified severe protein-calorie malnutrition: Secondary | ICD-10-CM | POA: Diagnosis not present

## 2015-08-02 DIAGNOSIS — M1990 Unspecified osteoarthritis, unspecified site: Secondary | ICD-10-CM | POA: Diagnosis not present

## 2015-08-02 DIAGNOSIS — E119 Type 2 diabetes mellitus without complications: Secondary | ICD-10-CM | POA: Diagnosis not present

## 2015-08-02 DIAGNOSIS — K5730 Diverticulosis of large intestine without perforation or abscess without bleeding: Secondary | ICD-10-CM | POA: Diagnosis not present

## 2015-08-06 ENCOUNTER — Other Ambulatory Visit: Payer: Self-pay | Admitting: Physician Assistant

## 2015-08-08 DIAGNOSIS — G609 Hereditary and idiopathic neuropathy, unspecified: Secondary | ICD-10-CM | POA: Diagnosis not present

## 2015-08-08 DIAGNOSIS — N183 Chronic kidney disease, stage 3 (moderate): Secondary | ICD-10-CM | POA: Diagnosis not present

## 2015-08-08 DIAGNOSIS — E538 Deficiency of other specified B group vitamins: Secondary | ICD-10-CM | POA: Diagnosis not present

## 2015-08-08 DIAGNOSIS — G2581 Restless legs syndrome: Secondary | ICD-10-CM | POA: Diagnosis not present

## 2015-08-08 DIAGNOSIS — E039 Hypothyroidism, unspecified: Secondary | ICD-10-CM | POA: Diagnosis not present

## 2015-08-08 DIAGNOSIS — M1990 Unspecified osteoarthritis, unspecified site: Secondary | ICD-10-CM | POA: Diagnosis not present

## 2015-08-08 DIAGNOSIS — G629 Polyneuropathy, unspecified: Secondary | ICD-10-CM | POA: Diagnosis not present

## 2015-08-08 DIAGNOSIS — D649 Anemia, unspecified: Secondary | ICD-10-CM | POA: Diagnosis not present

## 2015-08-08 DIAGNOSIS — I10 Essential (primary) hypertension: Secondary | ICD-10-CM | POA: Diagnosis not present

## 2015-08-08 DIAGNOSIS — E119 Type 2 diabetes mellitus without complications: Secondary | ICD-10-CM | POA: Diagnosis not present

## 2015-08-08 DIAGNOSIS — M353 Polymyalgia rheumatica: Secondary | ICD-10-CM | POA: Diagnosis not present

## 2015-08-08 DIAGNOSIS — F329 Major depressive disorder, single episode, unspecified: Secondary | ICD-10-CM | POA: Diagnosis not present

## 2015-08-08 DIAGNOSIS — E43 Unspecified severe protein-calorie malnutrition: Secondary | ICD-10-CM | POA: Diagnosis not present

## 2015-08-08 DIAGNOSIS — E1140 Type 2 diabetes mellitus with diabetic neuropathy, unspecified: Secondary | ICD-10-CM | POA: Diagnosis not present

## 2015-08-08 DIAGNOSIS — K5730 Diverticulosis of large intestine without perforation or abscess without bleeding: Secondary | ICD-10-CM | POA: Diagnosis not present

## 2015-08-08 DIAGNOSIS — G8929 Other chronic pain: Secondary | ICD-10-CM | POA: Diagnosis not present

## 2015-08-09 DIAGNOSIS — K5730 Diverticulosis of large intestine without perforation or abscess without bleeding: Secondary | ICD-10-CM | POA: Diagnosis not present

## 2015-08-09 DIAGNOSIS — E43 Unspecified severe protein-calorie malnutrition: Secondary | ICD-10-CM | POA: Diagnosis not present

## 2015-08-09 DIAGNOSIS — M1990 Unspecified osteoarthritis, unspecified site: Secondary | ICD-10-CM | POA: Diagnosis not present

## 2015-08-09 DIAGNOSIS — G609 Hereditary and idiopathic neuropathy, unspecified: Secondary | ICD-10-CM | POA: Diagnosis not present

## 2015-08-09 DIAGNOSIS — E119 Type 2 diabetes mellitus without complications: Secondary | ICD-10-CM | POA: Diagnosis not present

## 2015-08-09 DIAGNOSIS — M353 Polymyalgia rheumatica: Secondary | ICD-10-CM | POA: Diagnosis not present

## 2015-08-14 ENCOUNTER — Encounter (HOSPITAL_COMMUNITY): Payer: Self-pay | Admitting: Internal Medicine

## 2015-08-27 DIAGNOSIS — E1140 Type 2 diabetes mellitus with diabetic neuropathy, unspecified: Secondary | ICD-10-CM | POA: Diagnosis not present

## 2015-08-27 DIAGNOSIS — G2581 Restless legs syndrome: Secondary | ICD-10-CM | POA: Diagnosis not present

## 2015-08-27 DIAGNOSIS — I10 Essential (primary) hypertension: Secondary | ICD-10-CM | POA: Diagnosis not present

## 2015-08-27 DIAGNOSIS — Z23 Encounter for immunization: Secondary | ICD-10-CM | POA: Diagnosis not present

## 2015-08-27 DIAGNOSIS — E039 Hypothyroidism, unspecified: Secondary | ICD-10-CM | POA: Diagnosis not present

## 2015-08-27 DIAGNOSIS — E538 Deficiency of other specified B group vitamins: Secondary | ICD-10-CM | POA: Diagnosis not present

## 2015-08-27 DIAGNOSIS — G629 Polyneuropathy, unspecified: Secondary | ICD-10-CM | POA: Diagnosis not present

## 2015-08-27 DIAGNOSIS — G8929 Other chronic pain: Secondary | ICD-10-CM | POA: Diagnosis not present

## 2015-08-27 DIAGNOSIS — F329 Major depressive disorder, single episode, unspecified: Secondary | ICD-10-CM | POA: Diagnosis not present

## 2015-09-04 DIAGNOSIS — G629 Polyneuropathy, unspecified: Secondary | ICD-10-CM | POA: Diagnosis not present

## 2015-09-04 DIAGNOSIS — Z0000 Encounter for general adult medical examination without abnormal findings: Secondary | ICD-10-CM | POA: Diagnosis not present

## 2015-09-04 DIAGNOSIS — Z01118 Encounter for examination of ears and hearing with other abnormal findings: Secondary | ICD-10-CM | POA: Diagnosis not present

## 2015-09-04 DIAGNOSIS — E1140 Type 2 diabetes mellitus with diabetic neuropathy, unspecified: Secondary | ICD-10-CM | POA: Diagnosis not present

## 2015-09-04 DIAGNOSIS — I10 Essential (primary) hypertension: Secondary | ICD-10-CM | POA: Diagnosis not present

## 2015-09-04 DIAGNOSIS — E538 Deficiency of other specified B group vitamins: Secondary | ICD-10-CM | POA: Diagnosis not present

## 2015-09-04 DIAGNOSIS — E039 Hypothyroidism, unspecified: Secondary | ICD-10-CM | POA: Diagnosis not present

## 2015-09-04 DIAGNOSIS — Z0100 Encounter for examination of eyes and vision without abnormal findings: Secondary | ICD-10-CM | POA: Diagnosis not present

## 2015-09-24 DIAGNOSIS — H903 Sensorineural hearing loss, bilateral: Secondary | ICD-10-CM | POA: Diagnosis not present

## 2015-10-09 DIAGNOSIS — M898X1 Other specified disorders of bone, shoulder: Secondary | ICD-10-CM | POA: Diagnosis not present

## 2015-10-09 DIAGNOSIS — M542 Cervicalgia: Secondary | ICD-10-CM | POA: Diagnosis not present

## 2015-10-09 DIAGNOSIS — M545 Low back pain: Secondary | ICD-10-CM | POA: Diagnosis not present

## 2015-10-09 DIAGNOSIS — E538 Deficiency of other specified B group vitamins: Secondary | ICD-10-CM | POA: Diagnosis not present

## 2015-10-13 ENCOUNTER — Other Ambulatory Visit: Payer: Self-pay | Admitting: Physician Assistant

## 2015-11-23 DIAGNOSIS — F329 Major depressive disorder, single episode, unspecified: Secondary | ICD-10-CM | POA: Diagnosis not present

## 2015-11-23 DIAGNOSIS — E538 Deficiency of other specified B group vitamins: Secondary | ICD-10-CM | POA: Diagnosis not present

## 2015-11-23 DIAGNOSIS — G629 Polyneuropathy, unspecified: Secondary | ICD-10-CM | POA: Diagnosis not present

## 2015-11-23 DIAGNOSIS — E1140 Type 2 diabetes mellitus with diabetic neuropathy, unspecified: Secondary | ICD-10-CM | POA: Diagnosis not present

## 2015-11-23 DIAGNOSIS — I10 Essential (primary) hypertension: Secondary | ICD-10-CM | POA: Diagnosis not present

## 2015-11-23 DIAGNOSIS — N390 Urinary tract infection, site not specified: Secondary | ICD-10-CM | POA: Diagnosis not present

## 2015-11-23 DIAGNOSIS — E039 Hypothyroidism, unspecified: Secondary | ICD-10-CM | POA: Diagnosis not present

## 2015-11-23 DIAGNOSIS — G8929 Other chronic pain: Secondary | ICD-10-CM | POA: Diagnosis not present

## 2015-11-30 DIAGNOSIS — N390 Urinary tract infection, site not specified: Secondary | ICD-10-CM | POA: Diagnosis not present

## 2015-11-30 DIAGNOSIS — G629 Polyneuropathy, unspecified: Secondary | ICD-10-CM | POA: Diagnosis not present

## 2015-11-30 DIAGNOSIS — E039 Hypothyroidism, unspecified: Secondary | ICD-10-CM | POA: Diagnosis not present

## 2015-11-30 DIAGNOSIS — E538 Deficiency of other specified B group vitamins: Secondary | ICD-10-CM | POA: Diagnosis not present

## 2015-11-30 DIAGNOSIS — F329 Major depressive disorder, single episode, unspecified: Secondary | ICD-10-CM | POA: Diagnosis not present

## 2015-11-30 DIAGNOSIS — G8929 Other chronic pain: Secondary | ICD-10-CM | POA: Diagnosis not present

## 2015-11-30 DIAGNOSIS — I10 Essential (primary) hypertension: Secondary | ICD-10-CM | POA: Diagnosis not present

## 2015-11-30 DIAGNOSIS — E1140 Type 2 diabetes mellitus with diabetic neuropathy, unspecified: Secondary | ICD-10-CM | POA: Diagnosis not present

## 2015-12-04 ENCOUNTER — Inpatient Hospital Stay (HOSPITAL_COMMUNITY)
Admission: EM | Admit: 2015-12-04 | Discharge: 2015-12-08 | DRG: 683 | Disposition: A | Discharge status: Home Health | Payer: Medicare Other | Attending: Internal Medicine | Admitting: Internal Medicine

## 2015-12-04 ENCOUNTER — Emergency Department (HOSPITAL_COMMUNITY): Payer: Medicare Other

## 2015-12-04 ENCOUNTER — Encounter (HOSPITAL_COMMUNITY): Payer: Self-pay | Admitting: Emergency Medicine

## 2015-12-04 DIAGNOSIS — I10 Essential (primary) hypertension: Secondary | ICD-10-CM | POA: Diagnosis not present

## 2015-12-04 DIAGNOSIS — N179 Acute kidney failure, unspecified: Principal | ICD-10-CM | POA: Diagnosis present

## 2015-12-04 DIAGNOSIS — R079 Chest pain, unspecified: Secondary | ICD-10-CM | POA: Diagnosis not present

## 2015-12-04 DIAGNOSIS — Z66 Do not resuscitate: Secondary | ICD-10-CM | POA: Diagnosis present

## 2015-12-04 DIAGNOSIS — Z79899 Other long term (current) drug therapy: Secondary | ICD-10-CM

## 2015-12-04 DIAGNOSIS — E1122 Type 2 diabetes mellitus with diabetic chronic kidney disease: Secondary | ICD-10-CM

## 2015-12-04 DIAGNOSIS — N183 Type 2 diabetes mellitus with diabetic chronic kidney disease: Secondary | ICD-10-CM

## 2015-12-04 DIAGNOSIS — E875 Hyperkalemia: Secondary | ICD-10-CM | POA: Diagnosis present

## 2015-12-04 DIAGNOSIS — E039 Hypothyroidism, unspecified: Secondary | ICD-10-CM | POA: Diagnosis present

## 2015-12-04 DIAGNOSIS — Z7982 Long term (current) use of aspirin: Secondary | ICD-10-CM | POA: Diagnosis not present

## 2015-12-04 DIAGNOSIS — D638 Anemia in other chronic diseases classified elsewhere: Secondary | ICD-10-CM | POA: Diagnosis present

## 2015-12-04 DIAGNOSIS — E860 Dehydration: Secondary | ICD-10-CM | POA: Diagnosis not present

## 2015-12-04 DIAGNOSIS — M353 Polymyalgia rheumatica: Secondary | ICD-10-CM | POA: Diagnosis present

## 2015-12-04 DIAGNOSIS — E11649 Type 2 diabetes mellitus with hypoglycemia without coma: Secondary | ICD-10-CM | POA: Diagnosis present

## 2015-12-04 DIAGNOSIS — I5032 Chronic diastolic (congestive) heart failure: Secondary | ICD-10-CM | POA: Diagnosis present

## 2015-12-04 DIAGNOSIS — Z86718 Personal history of other venous thrombosis and embolism: Secondary | ICD-10-CM | POA: Diagnosis not present

## 2015-12-04 DIAGNOSIS — Z86711 Personal history of pulmonary embolism: Secondary | ICD-10-CM | POA: Diagnosis not present

## 2015-12-04 DIAGNOSIS — R531 Weakness: Secondary | ICD-10-CM | POA: Diagnosis not present

## 2015-12-04 DIAGNOSIS — G629 Polyneuropathy, unspecified: Secondary | ICD-10-CM | POA: Diagnosis present

## 2015-12-04 DIAGNOSIS — Z808 Family history of malignant neoplasm of other organs or systems: Secondary | ICD-10-CM | POA: Diagnosis not present

## 2015-12-04 DIAGNOSIS — R404 Transient alteration of awareness: Secondary | ICD-10-CM | POA: Diagnosis not present

## 2015-12-04 LAB — URINE MICROSCOPIC-ADD ON
Bacteria, UA: NONE SEEN
SQUAMOUS EPITHELIAL / LPF: NONE SEEN

## 2015-12-04 LAB — COMPREHENSIVE METABOLIC PANEL
ALBUMIN: 3.3 g/dL — AB (ref 3.5–5.0)
ALT: 16 U/L (ref 14–54)
AST: 26 U/L (ref 15–41)
Alkaline Phosphatase: 80 U/L (ref 38–126)
Anion gap: 7 (ref 5–15)
BUN: 45 mg/dL — ABNORMAL HIGH (ref 6–20)
CO2: 27 mmol/L (ref 22–32)
CREATININE: 1.79 mg/dL — AB (ref 0.44–1.00)
Calcium: 9.1 mg/dL (ref 8.9–10.3)
Chloride: 106 mmol/L (ref 101–111)
GFR calc Af Amer: 27 mL/min — ABNORMAL LOW (ref >60)
GFR calc non Af Amer: 23 mL/min — ABNORMAL LOW (ref >60)
GLUCOSE: 129 mg/dL — AB (ref 65–99)
POTASSIUM: 5.7 mmol/L — AB (ref 3.5–5.1)
SODIUM: 140 mmol/L (ref 135–145)
Total Bilirubin: 0.4 mg/dL (ref 0.3–1.2)
Total Protein: 5.7 g/dL — ABNORMAL LOW (ref 6.5–8.1)

## 2015-12-04 LAB — URINALYSIS, ROUTINE W REFLEX MICROSCOPIC
BILIRUBIN URINE: NEGATIVE
GLUCOSE, UA: NEGATIVE mg/dL
HGB URINE DIPSTICK: NEGATIVE
Ketones, ur: NEGATIVE mg/dL
Leukocytes, UA: NEGATIVE
Nitrite: NEGATIVE
PH: 7.5 (ref 5.0–8.0)
Protein, ur: 30 mg/dL — AB
SPECIFIC GRAVITY, URINE: 1.013 (ref 1.005–1.030)

## 2015-12-04 LAB — BASIC METABOLIC PANEL
Anion gap: 9 (ref 5–15)
BUN: 43 mg/dL — AB (ref 6–20)
CHLORIDE: 110 mmol/L (ref 101–111)
CO2: 24 mmol/L (ref 22–32)
Calcium: 8.7 mg/dL — ABNORMAL LOW (ref 8.9–10.3)
Creatinine, Ser: 1.6 mg/dL — ABNORMAL HIGH (ref 0.44–1.00)
GFR calc Af Amer: 31 mL/min — ABNORMAL LOW (ref >60)
GFR calc non Af Amer: 26 mL/min — ABNORMAL LOW (ref >60)
GLUCOSE: 87 mg/dL (ref 65–99)
POTASSIUM: 4.8 mmol/L (ref 3.5–5.1)
Sodium: 143 mmol/L (ref 135–145)

## 2015-12-04 LAB — I-STAT TROPONIN, ED: TROPONIN I, POC: 0 ng/mL (ref 0.00–0.08)

## 2015-12-04 LAB — CBC WITH DIFFERENTIAL/PLATELET
BASOS ABS: 0 10*3/uL (ref 0.0–0.1)
BASOS PCT: 1 %
EOS PCT: 3 %
Eosinophils Absolute: 0.1 10*3/uL (ref 0.0–0.7)
HCT: 29.6 % — ABNORMAL LOW (ref 36.0–46.0)
Hemoglobin: 9.3 g/dL — ABNORMAL LOW (ref 12.0–15.0)
Lymphocytes Relative: 31 %
Lymphs Abs: 1.4 10*3/uL (ref 0.7–4.0)
MCH: 27.6 pg (ref 26.0–34.0)
MCHC: 31.4 g/dL (ref 30.0–36.0)
MCV: 87.8 fL (ref 78.0–100.0)
MONO ABS: 0.3 10*3/uL (ref 0.1–1.0)
Monocytes Relative: 8 %
Neutro Abs: 2.6 10*3/uL (ref 1.7–7.7)
Neutrophils Relative %: 57 %
PLATELETS: 229 10*3/uL (ref 150–400)
RBC: 3.37 MIL/uL — ABNORMAL LOW (ref 3.87–5.11)
RDW: 14.5 % (ref 11.5–15.5)
WBC: 4.4 10*3/uL (ref 4.0–10.5)

## 2015-12-04 LAB — CK: CK TOTAL: 55 U/L (ref 38–234)

## 2015-12-04 LAB — I-STAT CG4 LACTIC ACID, ED: Lactic Acid, Venous: 0.75 mmol/L (ref 0.5–2.0)

## 2015-12-04 LAB — TROPONIN I: Troponin I: <0.03 ng/mL (ref <0.031)

## 2015-12-04 MED ORDER — GLUCERNA SHAKE PO LIQD
237.0000 mL | Freq: Three times a day (TID) | ORAL | Status: DC
  Administered 2015-12-05 – 2015-12-08 (×7): 237 mL via ORAL
  Filled 2015-12-04 (×16): qty 237

## 2015-12-04 MED ORDER — L-METHYLFOLATE-B6-B12 3-35-2 MG PO TABS
1.0000 | ORAL_TABLET | Freq: Every day | ORAL | Status: DC
  Administered 2015-12-05 – 2015-12-08 (×4): 1 via ORAL
  Filled 2015-12-04 (×5): qty 1

## 2015-12-04 MED ORDER — SODIUM CHLORIDE 0.9 % IV BOLUS (SEPSIS)
1000.0000 mL | Freq: Once | INTRAVENOUS | Status: AC
  Administered 2015-12-04: 1000 mL via INTRAVENOUS

## 2015-12-04 MED ORDER — ACETAMINOPHEN 650 MG RE SUPP: 650.0000 mg | Freq: Four times a day (QID) | RECTAL | Status: DC | PRN

## 2015-12-04 MED ORDER — APIXABAN 2.5 MG PO TABS
2.5000 mg | ORAL_TABLET | Freq: Two times a day (BID) | ORAL | Status: DC
  Administered 2015-12-04 – 2015-12-08 (×8): 2.5 mg via ORAL
  Filled 2015-12-04 (×9): qty 1

## 2015-12-04 MED ORDER — INSULIN ASPART 100 UNIT/ML ~~LOC~~ SOLN: 0.0000 [IU] | Freq: Three times a day (TID) | SUBCUTANEOUS | Status: DC

## 2015-12-04 MED ORDER — OXYCODONE HCL 5 MG PO TABS
10.0000 mg | ORAL_TABLET | Freq: Four times a day (QID) | ORAL | Status: DC | PRN
Start: 2015-12-04 — End: 2015-12-08
  Administered 2015-12-04: 10 mg via ORAL
  Filled 2015-12-04: qty 2

## 2015-12-04 MED ORDER — SODIUM CHLORIDE 0.9 % IV SOLN
INTRAVENOUS | Status: AC
  Administered 2015-12-04 – 2015-12-05 (×2): via INTRAVENOUS

## 2015-12-04 MED ORDER — ONDANSETRON HCL 4 MG/2ML IJ SOLN
4.0000 mg | Freq: Four times a day (QID) | INTRAMUSCULAR | Status: DC | PRN
  Administered 2015-12-05: 4 mg via INTRAVENOUS
  Filled 2015-12-04: qty 2

## 2015-12-04 MED ORDER — HYDRALAZINE HCL 20 MG/ML IJ SOLN: 10.0000 mg | INTRAMUSCULAR | Status: DC | PRN

## 2015-12-04 MED ORDER — CYANOCOBALAMIN 1000 MCG/ML IJ SOLN
1000.0000 ug | INTRAMUSCULAR | Status: DC
  Administered 2015-12-05: 1000 ug via INTRAMUSCULAR
  Filled 2015-12-04: qty 1

## 2015-12-04 MED ORDER — HYOSCYAMINE SULFATE 0.125 MG SL SUBL
0.1250 mg | SUBLINGUAL_TABLET | Freq: Four times a day (QID) | SUBLINGUAL | Status: DC | PRN
  Filled 2015-12-04: qty 1

## 2015-12-04 MED ORDER — POLYETHYLENE GLYCOL 3350 17 G PO PACK
17.0000 g | PACK | Freq: Every day | ORAL | Status: DC
  Administered 2015-12-05 – 2015-12-07 (×3): 17 g via ORAL
  Filled 2015-12-04 (×5): qty 1

## 2015-12-04 MED ORDER — PANTOPRAZOLE SODIUM 40 MG PO TBEC
40.0000 mg | DELAYED_RELEASE_TABLET | Freq: Every day | ORAL | Status: DC
  Administered 2015-12-05 – 2015-12-08 (×4): 40 mg via ORAL
  Filled 2015-12-04 (×5): qty 1

## 2015-12-04 MED ORDER — ACETAMINOPHEN 325 MG PO TABS
650.0000 mg | ORAL_TABLET | Freq: Four times a day (QID) | ORAL | Status: DC | PRN
  Administered 2015-12-06 – 2015-12-07 (×3): 650 mg via ORAL
  Filled 2015-12-04 (×3): qty 2

## 2015-12-04 MED ORDER — DICLOFENAC SODIUM 1 % TD GEL
2.0000 g | Freq: Every morning | TRANSDERMAL | Status: DC
  Administered 2015-12-05 – 2015-12-08 (×4): 2 g via TOPICAL
  Filled 2015-12-04: qty 100

## 2015-12-04 MED ORDER — GABAPENTIN 100 MG PO CAPS
100.0000 mg | ORAL_CAPSULE | Freq: Two times a day (BID) | ORAL | Status: DC
  Administered 2015-12-04 – 2015-12-08 (×8): 100 mg via ORAL
  Filled 2015-12-04 (×9): qty 1

## 2015-12-04 MED ORDER — ACETAMINOPHEN 325 MG PO TABS
650.0000 mg | ORAL_TABLET | Freq: Once | ORAL | Status: AC
  Administered 2015-12-04: 650 mg via ORAL
  Filled 2015-12-04: qty 2

## 2015-12-04 MED ORDER — MIRABEGRON ER 25 MG PO TB24
25.0000 mg | ORAL_TABLET | Freq: Every day | ORAL | Status: DC
  Administered 2015-12-05 – 2015-12-08 (×4): 25 mg via ORAL
  Filled 2015-12-04 (×5): qty 1

## 2015-12-04 MED ORDER — SODIUM CHLORIDE 0.9 % IV SOLN
1.0000 g | Freq: Once | INTRAVENOUS | Status: AC
  Administered 2015-12-04: 1 g via INTRAVENOUS
  Filled 2015-12-04: qty 10

## 2015-12-04 MED ORDER — DICYCLOMINE HCL 10 MG PO CAPS
10.0000 mg | ORAL_CAPSULE | Freq: Three times a day (TID) | ORAL | Status: DC
  Administered 2015-12-05 – 2015-12-08 (×9): 10 mg via ORAL
  Filled 2015-12-04 (×13): qty 1

## 2015-12-04 MED ORDER — LEVOTHYROXINE SODIUM 50 MCG PO TABS
75.0000 ug | ORAL_TABLET | Freq: Every day | ORAL | Status: DC
  Administered 2015-12-05 – 2015-12-08 (×4): 75 ug via ORAL
  Filled 2015-12-04 (×5): qty 1

## 2015-12-04 MED ORDER — ONDANSETRON HCL 4 MG PO TABS: 4.0000 mg | ORAL_TABLET | Freq: Four times a day (QID) | ORAL | Status: DC | PRN

## 2015-12-04 MED ORDER — MIRTAZAPINE 15 MG PO TABS
15.0000 mg | ORAL_TABLET | Freq: Every day | ORAL | Status: DC
  Administered 2015-12-04 – 2015-12-07 (×4): 15 mg via ORAL
  Filled 2015-12-04 (×4): qty 1

## 2015-12-04 MED ORDER — ROPINIROLE HCL 1 MG PO TABS
5.0000 mg | ORAL_TABLET | Freq: Every day | ORAL | Status: DC
  Administered 2015-12-04 – 2015-12-07 (×4): 5 mg via ORAL
  Filled 2015-12-04 (×5): qty 5

## 2015-12-04 NOTE — ED Provider Notes (Signed)
CSN: [id number]     Arrival date & time 12/04/15  1643 History   First MD Initiated Contact with Patient 12/04/15 1709     Chief Complaint  Patient presents with  . Fatigue     (Consider location/radiation/quality/duration/timing/severity/associated sxs/prior Treatment) HPI  Patient is a 81 year old female presenting with fatigue. Patient was seen a week ago by her PCP and given antibiotics for UTI. Patient unable to tolerate the antibiotics because of vomiting. She returned to the PCP on Friday and was given a shot of antibiotics. Since then the last 4 days she's been feeling increasingly ill. She feels very weak. No fever.  No recent nausea vomiting.    Past Medical History  Diagnosis Date  . Cyst and pseudocyst of pancreas   . Unspecified essential hypertension   . Other and unspecified hyperlipidemia   . Polymyalgia rheumatica ([place])   . Personal history of unspecified digestive disease   . Restless legs syndrome (RLS)   . Insomnia, unspecified   . Other malaise and fatigue   . Diverticulosis of colon (without mention of hemorrhage)   . Acute gastritis without mention of hemorrhage   . Duodenitis without mention of hemorrhage   . Diaphragmatic hernia without mention of obstruction or gangrene   . Esophageal reflux   . Stricture and stenosis of esophagus   . OAB (overactive bladder)   . Left leg DVT ([place]) jan 2013  . Anemia   . Hx pulmonary embolism   . Hiatal hernia- moderate to large 10/06/2014  . Pancreas divisum 03/02/2015  . Pancreatic cyst   . Protein-calorie malnutrition, severe ([place]) 10/02/2014  . Crohn disease ([place])   . Shingles   . DVT (deep venous thrombosis) ([place])     "twice on the left leg; once on the right leg" (06/12/2015)  . Hypothyroidism   . CAP (community acquired pneumonia) 04/03/2014  . Unspecified sleep apnea     hx of sleep apnea, none since weight loss (06/12/2015)  . Type II diabetes mellitus ([place])   . Migraine     hx  . Arthritis     "LLL;  palm of right hand" (06/12/2015)  . Depression   . Anxiety   . Urinary frequency   . Urinary urgency   . Dysrhythmia   . Atrial fibrillation [facility])    Past Surgical History  Procedure Laterality Date  . Oophorectomy  1944    not sure which ovary  . Rotator cuff repair Right   . Cataract extraction, bilateral Bilateral   . Posterior lumbar fusion  01/2009    L4-L5 Archie Endo 04/09/2011  . Colon surgery  June 17, 2012    surgery for bleed  . Shoulder open rotator cuff repair  08/04/2012    Procedure: ROTATOR CUFF REPAIR SHOULDER OPEN;  Surgeon: Tobi Bastos, MD;  Location: [facility];  Service: Orthopedics;  Laterality: Left;  with Anchors and Graft  . Tonsillectomy    . Shoulder open rotator cuff repair Right 2006    Archie Endo 04/22/2011  . Closed reduction nasal fracture  08/2008    Archie Endo 04/10/2011  . Back surgery    . Reduction mammaplasty Bilateral 1980  . Colonoscopy N/A 06/13/2015    Procedure: COLONOSCOPY;  Surgeon: Irene Shipper, MD;  Location: [place];  Service: Endoscopy;  Laterality: N/A;   Family History  Problem Relation Age of Onset  . Melanoma Daughter     died age 44  . Melanoma Son    Social History  Substance  Use Topics  . Smoking status: Never Smoker   . Smokeless tobacco: Never Used  . Alcohol Use: No   OB History    No data available     Review of Systems  Constitutional: Positive for appetite change and fatigue. Negative for fever and activity change.  HENT: Negative for congestion.   Eyes: Negative for discharge.  Respiratory: Negative for cough, chest tightness and shortness of breath.   Cardiovascular: Negative for chest pain.  Gastrointestinal: Negative for abdominal distention.  Genitourinary: Negative for dysuria and difficulty urinating.  Musculoskeletal: Negative for joint swelling.  Skin: Negative for rash.  Allergic/Immunologic: Negative for immunocompromised state.  Neurological: Negative for speech difficulty.  Psychiatric/Behavioral:  Negative for agitation.      Allergies  Sulfa antibiotics and Penicillins  Home Medications   Prior to Admission medications   Medication Sig Start Date End Date Taking? Authorizing Provider  apixaban (ELIQUIS) 2.5 MG TABS tablet Take 1 tablet (2.5 mg total) by mouth 2 (two) times daily. 10/09/14  Yes Belkys A Regalado, MD  furosemide (LASIX) 20 MG tablet Take 1 tablet (20 mg total) by mouth every 3 (three) days as needed for fluid or edema. 06/14/15  Yes Florencia Reasons, MD  gabapentin (NEURONTIN) 100 MG capsule Take 100 mg by mouth 2 (two) times daily.   Yes Historical Provider, MD  levothyroxine (SYNTHROID, LEVOTHROID) 75 MCG tablet Take 75 mcg by mouth daily before breakfast.   Yes Historical Provider, MD  losartan (COZAAR) 50 MG tablet Take 50 mg by mouth daily.   Yes Historical Provider, MD  metFORMIN (GLUCOPHAGE) 500 MG tablet Take 1 tablet (500 mg total) by mouth 2 (two) times daily with a meal. 03/03/15  Yes Christina P Rama, MD  mirabegron ER (MYRBETRIQ) 25 MG TB24 tablet Take 25 mg by mouth daily.   Yes Historical Provider, MD  ondansetron (ZOFRAN) 4 MG tablet Take 1 tablet (4 mg total) by mouth every 8 (eight) hours as needed for nausea or vomiting. 10/15/15  Yes Amy S Esterwood, PA-C  pantoprazole (PROTONIX) 40 MG tablet Take 40 mg by mouth daily.   Yes Historical Provider, MD  phenazopyridine (PYRIDIUM) 200 MG tablet Take 200 mg by mouth 3 (three) times daily as needed for pain.   Yes Historical Provider, MD  ropinirole (REQUIP) 5 MG tablet Take 5 mg by mouth at bedtime.   Yes Historical Provider, MD  Blood Glucose Calibration (TAI DOC CONTROL) NORMAL SOLN USE TO PERIODICALLY CHECK GLUCOSE MONITOR ACCORDING TO THE MANUAL 04/17/15   Historical Provider, MD  Blood Glucose Monitoring Suppl (CLEVER CHEK AUTO-CODE VOICE) [place] USE TO TEST BLOOD GLUCOSE 04/17/15   Historical Provider, MD  cyanocobalamin (,VITAMIN B-12,) 1000 MCG/ML injection Inject 1,000 mcg into the muscle every 30 (thirty) days.     Historical Provider, MD  diclofenac sodium (VOLTAREN) 1 % GEL Apply 2 g topically every morning.    Historical Provider, MD  dicyclomine (BENTYL) 10 MG capsule TAKE 1 CAPSULE BY MOUTH TWICE DAILY FOR STOMACH SPASMS AND CRAMPS 06/08/15   Amy S Esterwood, PA-C  dicyclomine (BENTYL) 10 MG capsule TAKE 1 CAPSULE BY MOUTH TWICE DAILY FOR STOMACH SPASMS AND CRAMPS 06/18/15   Amy S Esterwood, PA-C  feeding supplement, GLUCERNA SHAKE, (GLUCERNA SHAKE) LIQD Take 237 mLs by mouth 4 (four) times daily - after meals and at bedtime. 10/09/14   Belkys A Regalado, MD  hyoscyamine (LEVSIN SL) 0.125 MG SL tablet Place 1 tablet (0.125 mg total) under the tongue every 6 (six)  hours as needed for cramping. 03/03/15   Venetia Maxon Rama, MD  l-methylfolate-B6-B12 (METANX) 3-35-2 MG TABS Take 1 tablet by mouth daily.    Historical Provider, MD  LITE TOUCH LANCETS MISC daily.  04/17/15   Historical Provider, MD  mirtazapine (REMERON) 15 MG tablet Take 15 mg by mouth at bedtime.    Historical Provider, MD  oxyCODONE (OXY IR/ROXICODONE) 5 MG immediate release tablet Take 2 tabs= 10 mg by mouth every 6 hours as needed for nausea. 03/06/15   Estill Dooms, MD  PHARMACIST CHOICE NO CODING test strip 1 each by Other route as needed.  04/17/15   Historical Provider, MD  polyethylene glycol (MIRALAX / GLYCOLAX) packet Take 17 g by mouth daily. 03/03/15   Christina P Rama, MD   BP 145/54 mmHg  Pulse 50  Temp(Src) 98 F (36.7 C) (Oral)  Resp 20  Ht 5' (1.524 m)  Wt 107 lb 2.3 oz (48.6 kg)  BMI 20.93 kg/m2  SpO2 100% Physical Exam  Constitutional: She is oriented to person, place, and time. She appears well-developed and well-nourished.  Cachectic female  HENT:  Head: Normocephalic and atraumatic.  Dry mucous membranes  Eyes: Conjunctivae are normal. Right eye exhibits no discharge.  Neck: Neck supple.  Cardiovascular: Normal rate, regular rhythm and normal heart sounds.   No murmur heard. Pulmonary/Chest: Effort normal and  breath sounds normal. She has no wheezes. She has no rales.  Abdominal: Soft. She exhibits no distension. There is no tenderness.  Musculoskeletal: Normal range of motion. She exhibits no edema.  Neurological: She is oriented to person, place, and time. No cranial nerve deficit.  Skin: Skin is warm and dry. No rash noted. She is not diaphoretic.  Nursing note and vitals reviewed.   ED Course  Procedures (including critical care time) Labs Review Labs Reviewed  CBC WITH DIFFERENTIAL/PLATELET - Abnormal; Notable for the following:    RBC 3.37 (*)    Hemoglobin 9.3 (*)    HCT 29.6 (*)    All other components within normal limits  COMPREHENSIVE METABOLIC PANEL - Abnormal; Notable for the following:    Potassium 5.7 (*)    Glucose, Bld 129 (*)    BUN 45 (*)    Creatinine, Ser 1.79 (*)    Total Protein 5.7 (*)    Albumin 3.3 (*)    GFR calc non Af Amer 23 (*)    GFR calc Af Amer 27 (*)    All other components within normal limits  URINALYSIS, ROUTINE W REFLEX MICROSCOPIC (NOT AT [facility]) - Abnormal; Notable for the following:    Protein, ur 30 (*)    All other components within normal limits  URINE CULTURE  URINE MICROSCOPIC-ADD ON  BASIC METABOLIC PANEL  CK  TROPONIN I  COMPREHENSIVE METABOLIC PANEL  CBC WITH DIFFERENTIAL/PLATELET  I-STAT TROPOININ, ED  I-STAT CG4 LACTIC ACID, ED    Imaging Review Dg Chest 2 View  12/04/2015  CLINICAL DATA:  Chest pain, generalize weakness, low back pain and foul-smelling urine EXAM: CHEST  2 VIEW COMPARISON:  02/28/2015 FINDINGS: The lungs are hyperinflated likely secondary to COPD. There is no focal parenchymal opacity. There is no pleural effusion or pneumothorax. The heart and mediastinal contours are unremarkable. The osseous structures are unremarkable. There is evidence of prior rotator cuff repair. IMPRESSION: No active cardiopulmonary disease. Electronically Signed   By: Kathreen Devoid   On: 12/04/2015 18:47   I have personally reviewed  and evaluated these images and  lab results as part of my medical decision-making.   EKG Interpretation   Date/Time:  Tuesday December 04 2015 18:17:25 EST Ventricular Rate:  54 PR Interval:  178 QRS Duration: 125 QT Interval:  459 QTC Calculation: 435 R Axis:   -71 Text Interpretation:  Sinus rhythm Atrial premature complex RBBB and LAFB  Borderline ST elevation, anterior leads no acute ischemia  mildly peaked  ts in V3  No significant change since last tracing Reconfirmed by [facility],  COURTNEY ([id number]) on 12/04/2015 7:22:57 PM Also confirmed by Gerald Leitz ([id number])  on 12/04/2015 11:34:48 PM      MDM   Final diagnoses:  Dehydration    Patient is a 81 year old female with treated UTI presenting today with fatigue. Patient per she's been unable to get up and move around. She had oral antibiotics but did not tolerate them. Received 1 shot of antibiotics on Friday and is continued to feel ill. We will get labs to rule out sepsis, look at kidney function. We'll give fluids today.  Patient has AKI with elevated potassium.  This is likely prerenal.  We will give fluids. Peaked Ts in V3, will give calcium.  Likely will be able to renally excrete K with fluids.  Patietn globally weak.  Will admit to obs.    Darene Nappi Julio Alm, MD 12/04/15 [phone]

## 2015-12-04 NOTE — ED Notes (Signed)
Patient transported to X-ray 

## 2015-12-04 NOTE — ED Notes (Signed)
Awake. Verbally responsive. A/O x4. Resp even and unlabored. No audible adventitious breath sounds noted. ABC's intact. IV saline lock patent and intact. Family at bedside. 

## 2015-12-04 NOTE — H&P (Signed)
[facility] History and Physical  [name] TYO:[id number] DOB: Oct 04, 1919 DOA: 12/04/2015  Referring physician: Dr.Mackuen. PCP: Benito Mccreedy, MD  Specialists: None.  Chief Complaint: Weakness and fatigue.  HPI: [name] is a 81 y.o. female with history of hypertension, DVT on Apixaban, diabetes mellitus type 2, hypothyroidism presents to the ER because of weakness and fatigue. Patient was recently treated with antibiotic course for UTI. Patient denies any chest pain or shortness of breath. Patient states she feels very weak and tired. In the ER labs reveal acute renal failure with mild hyperkalemia. Patient was given fluid bolus and admitted for further management.   Review of Systems: As presented in the history of presenting illness, rest negative.  Past Medical History  Diagnosis Date  . Cyst and pseudocyst of pancreas   . Unspecified essential hypertension   . Other and unspecified hyperlipidemia   . Polymyalgia rheumatica ([place])   . Personal history of unspecified digestive disease   . Restless legs syndrome (RLS)   . Insomnia, unspecified   . Other malaise and fatigue   . Diverticulosis of colon (without mention of hemorrhage)   . Acute gastritis without mention of hemorrhage   . Duodenitis without mention of hemorrhage   . Diaphragmatic hernia without mention of obstruction or gangrene   . Esophageal reflux   . Stricture and stenosis of esophagus   . OAB (overactive bladder)   . Left leg DVT ([place]) jan 2013  . Anemia   . Hx pulmonary embolism   . Hiatal hernia- moderate to large 10/06/2014  . Pancreas divisum 03/02/2015  . Pancreatic cyst   . Protein-calorie malnutrition, severe ([place]) 10/02/2014  . Crohn disease ([place])   . Shingles   . DVT (deep venous thrombosis) ([place])     "twice on the left leg; once on the right leg" (06/12/2015)  . Hypothyroidism   . CAP (community acquired pneumonia) 04/03/2014  . Unspecified sleep apnea     hx of sleep  apnea, none since weight loss (06/12/2015)  . Type II diabetes mellitus ([place])   . Migraine     hx  . Arthritis     "LLL; palm of right hand" (06/12/2015)  . Depression   . Anxiety   . Urinary frequency   . Urinary urgency   . Dysrhythmia   . Atrial fibrillation [facility])    Past Surgical History  Procedure Laterality Date  . Oophorectomy  1944    not sure which ovary  . Rotator cuff repair Right   . Cataract extraction, bilateral Bilateral   . Posterior lumbar fusion  01/2009    L4-L5 Archie Endo 04/09/2011  . Colon surgery  June 17, 2012    surgery for bleed  . Shoulder open rotator cuff repair  08/04/2012    Procedure: ROTATOR CUFF REPAIR SHOULDER OPEN;  Surgeon: Tobi Bastos, MD;  [facility];  Service: Orthopedics;  Laterality: Left;  with Anchors and Graft  . Tonsillectomy    . Shoulder open rotator cuff repair Right 2006    Archie Endo 04/22/2011  . Closed reduction nasal fracture  08/2008    Archie Endo 04/10/2011  . Back surgery    . Reduction mammaplasty Bilateral 1980  . Colonoscopy N/A 06/13/2015    Procedure: COLONOSCOPY;  Surgeon: Irene Shipper, MD;  Location: [place];  Service: Endoscopy;  Laterality: N/A;   Social History:  reports that she has never smoked. She has never used smokeless tobacco. She reports that she does not drink alcohol  or use illicit drugs. Where does patient live home. Can patient participate in ADLs? Yes.  Allergies  Allergen Reactions  . Sulfa Antibiotics Nausea And Vomiting  . Penicillins Hives and Rash    Family History:  Family History  Problem Relation Age of Onset  . Melanoma Daughter     died age 21  . Melanoma Son       Prior to Admission medications   Medication Sig Start Date End Date Taking? Authorizing Provider  apixaban (ELIQUIS) 2.5 MG TABS tablet Take 1 tablet (2.5 mg total) by mouth 2 (two) times daily. 10/09/14  Yes Belkys A Regalado, MD  furosemide (LASIX) 20 MG tablet Take 1 tablet (20 mg total) by mouth every 3 (three) days  as needed for fluid or edema. 06/14/15  Yes Florencia Reasons, MD  gabapentin (NEURONTIN) 100 MG capsule Take 100 mg by mouth 2 (two) times daily.   Yes Historical Provider, MD  levothyroxine (SYNTHROID, LEVOTHROID) 75 MCG tablet Take 75 mcg by mouth daily before breakfast.   Yes Historical Provider, MD  losartan (COZAAR) 50 MG tablet Take 50 mg by mouth daily.   Yes Historical Provider, MD  metFORMIN (GLUCOPHAGE) 500 MG tablet Take 1 tablet (500 mg total) by mouth 2 (two) times daily with a meal. 03/03/15  Yes Christina P Rama, MD  mirabegron ER (MYRBETRIQ) 25 MG TB24 tablet Take 25 mg by mouth daily.   Yes Historical Provider, MD  ondansetron (ZOFRAN) 4 MG tablet Take 1 tablet (4 mg total) by mouth every 8 (eight) hours as needed for nausea or vomiting. 10/15/15  Yes Amy S Esterwood, PA-C  pantoprazole (PROTONIX) 40 MG tablet Take 40 mg by mouth daily.   Yes Historical Provider, MD  phenazopyridine (PYRIDIUM) 200 MG tablet Take 200 mg by mouth 3 (three) times daily as needed for pain.   Yes Historical Provider, MD  ropinirole (REQUIP) 5 MG tablet Take 5 mg by mouth at bedtime.   Yes Historical Provider, MD  Blood Glucose Calibration (TAI DOC CONTROL) NORMAL SOLN USE TO PERIODICALLY CHECK GLUCOSE MONITOR ACCORDING TO THE MANUAL 04/17/15   Historical Provider, MD  Blood Glucose Monitoring Suppl (CLEVER CHEK AUTO-CODE VOICE) [place] USE TO TEST BLOOD GLUCOSE 04/17/15   Historical Provider, MD  cyanocobalamin (,VITAMIN B-12,) 1000 MCG/ML injection Inject 1,000 mcg into the muscle every 30 (thirty) days.    Historical Provider, MD  diclofenac sodium (VOLTAREN) 1 % GEL Apply 2 g topically every morning.    Historical Provider, MD  dicyclomine (BENTYL) 10 MG capsule TAKE 1 CAPSULE BY MOUTH TWICE DAILY FOR STOMACH SPASMS AND CRAMPS 06/08/15   Amy S Esterwood, PA-C  dicyclomine (BENTYL) 10 MG capsule TAKE 1 CAPSULE BY MOUTH TWICE DAILY FOR STOMACH SPASMS AND CRAMPS 06/18/15   Amy S Esterwood, PA-C  feeding supplement, GLUCERNA  SHAKE, (GLUCERNA SHAKE) LIQD Take 237 mLs by mouth 4 (four) times daily - after meals and at bedtime. 10/09/14   Belkys A Regalado, MD  hyoscyamine (LEVSIN SL) 0.125 MG SL tablet Place 1 tablet (0.125 mg total) under the tongue every 6 (six) hours as needed for cramping. 03/03/15   Venetia Maxon Rama, MD  l-methylfolate-B6-B12 (METANX) 3-35-2 MG TABS Take 1 tablet by mouth daily.    Historical Provider, MD  LITE TOUCH LANCETS MISC daily.  04/17/15   Historical Provider, MD  mirtazapine (REMERON) 15 MG tablet Take 15 mg by mouth at bedtime.    Historical Provider, MD  oxyCODONE (OXY IR/ROXICODONE) 5 MG immediate release  tablet Take 2 tabs= 10 mg by mouth every 6 hours as needed for nausea. 03/06/15   Estill Dooms, MD  PHARMACIST CHOICE NO CODING test strip 1 each by Other route as needed.  04/17/15   Historical Provider, MD  polyethylene glycol (MIRALAX / GLYCOLAX) packet Take 17 g by mouth daily. 03/03/15   Venetia Maxon Rama, MD    Physical Exam: Filed Vitals:   12/04/15 1818 12/04/15 1838 12/04/15 2000 12/04/15 2048  BP: 137/61 145/61 139/75 145/54  Pulse: 57 56 58 50  Temp:      TempSrc:      Resp: 21 20 21 20   Height:    5' (1.524 m)  Weight:    48.6 kg (107 lb 2.3 oz)  SpO2: 99% 97% 97% 100%     General:  Moderately built and nourished.  Eyes: Anicteric. No pallor.  ENT: No discharge from the ears eyes nose and mouth.  Neck: No mass felt.  Cardiovascular: S1 and S2 heard.  Respiratory: No rhonchi or crepitations.  Abdomen: Soft nontender bowel sounds present.  Skin: No rash.  Musculoskeletal: No edema.  Psychiatric: Appears normal.  Neurologic: Alert awake oriented to time place and person. Moves all extremities.  Labs on Admission:  Basic Metabolic Panel:  Recent Labs Lab 12/04/15 1740  NA 140  K 5.7*  CL 106  CO2 27  GLUCOSE 129*  BUN 45*  CREATININE 1.79*  CALCIUM 9.1   Liver Function Tests:  Recent Labs Lab 12/04/15 1740  AST 26  ALT 16  ALKPHOS 80   BILITOT 0.4  PROT 5.7*  ALBUMIN 3.3*   No results for input(s): LIPASE, AMYLASE in the last 168 hours. No results for input(s): AMMONIA in the last 168 hours. CBC:  Recent Labs Lab 12/04/15 1740  WBC 4.4  NEUTROABS 2.6  HGB 9.3*  HCT 29.6*  MCV 87.8  PLT 229   Cardiac Enzymes: No results for input(s): CKTOTAL, CKMB, CKMBINDEX, TROPONINI in the last 168 hours.  BNP (last 3 results) No results for input(s): BNP in the last 8760 hours.  ProBNP (last 3 results) No results for input(s): PROBNP in the last 8760 hours.  CBG: No results for input(s): GLUCAP in the last 168 hours.  Radiological Exams on Admission: Dg Chest 2 View  12/04/2015  CLINICAL DATA:  Chest pain, generalize weakness, low back pain and foul-smelling urine EXAM: CHEST  2 VIEW COMPARISON:  02/28/2015 FINDINGS: The lungs are hyperinflated likely secondary to COPD. There is no focal parenchymal opacity. There is no pleural effusion or pneumothorax. The heart and mediastinal contours are unremarkable. The osseous structures are unremarkable. There is evidence of prior rotator cuff repair. IMPRESSION: No active cardiopulmonary disease. Electronically Signed   By: Kathreen Devoid   On: 12/04/2015 18:47    EKG: Independently reviewed. Normal sinus rhythm with bradycardia around 54 bpm with nonspecific ST changes.  Assessment/Plan Principal Problem:   AKI (acute kidney injury) ([place]) Active Problems:   Essential hypertension   Hypothyroidism   Type 2 diabetes mellitus ([place])   Hx pulmonary embolism   1. Acute renal failure with hyperkalemia- probably from poor oral intake. At this time we will gently hydrate and hold patient's ARB and Lasix. Closely follow intake and output and metabolic panel. Patient also has history of diastolic CHF so closely follow respiratory status. 2. Fatigue and weakness probably from dehydration. Since patient also has history of polymyalgia rheumatica check CK levels and sedimentation  rate. 3. Recent treatment for  UTI - no evidence of UTI at this time. Follow urine cultures. 4. Hypertension - patient has been placed on when necessary IV hydralazine. Cozaar and Lasix on hold due to renal failure. 5. Diabetes mellitus type 2 - while inpatient patient will be on sliding scale coverage. 6. Hypothyroidism on Synthroid. 7. Chronic anemia - follow CBC. 8. History of DVT on Apixaban.   DVT Prophylaxis Apixaban.  Code Status: DO NOT RESUSCITATE.  Family Communication: Discussed with patient.  Disposition Plan: Admit to inpatient.    Opie Maclaughlin N. [facility] Pager [phone].  If 7PM-7AM, please contact night-coverage [email or web address] Password [email or web address] 12/04/2015, 10:40 PM

## 2015-12-04 NOTE — ED Notes (Signed)
Bed: WA17 Expected date:  Expected time:  Means of arrival:  Comments: "unwell" 81 y/o F

## 2015-12-04 NOTE — ED Notes (Addendum)
Pt arrived via EMS with report of generalized weakness. Pt reported having lower back pain and foul odor urine but denies urinary frequency/urgency, hematuria, burning/pressure with voiding and fever. Pt was dx UTI and started taking meds PO but it made her nauseous and f/u with PMD and was administered IM ABT on Friday.

## 2015-12-05 DIAGNOSIS — Z86711 Personal history of pulmonary embolism: Secondary | ICD-10-CM

## 2015-12-05 DIAGNOSIS — N183 Chronic kidney disease, stage 3 (moderate): Secondary | ICD-10-CM

## 2015-12-05 DIAGNOSIS — E039 Hypothyroidism, unspecified: Secondary | ICD-10-CM

## 2015-12-05 DIAGNOSIS — E1122 Type 2 diabetes mellitus with diabetic chronic kidney disease: Secondary | ICD-10-CM

## 2015-12-05 DIAGNOSIS — I10 Essential (primary) hypertension: Secondary | ICD-10-CM

## 2015-12-05 DIAGNOSIS — M353 Polymyalgia rheumatica: Secondary | ICD-10-CM

## 2015-12-05 DIAGNOSIS — D638 Anemia in other chronic diseases classified elsewhere: Secondary | ICD-10-CM

## 2015-12-05 DIAGNOSIS — N179 Acute kidney failure, unspecified: Principal | ICD-10-CM

## 2015-12-05 LAB — COMPREHENSIVE METABOLIC PANEL
ALBUMIN: 3.3 g/dL — AB (ref 3.5–5.0)
ALK PHOS: 81 U/L (ref 38–126)
ALT: 15 U/L (ref 14–54)
ANION GAP: 11 (ref 5–15)
AST: 26 U/L (ref 15–41)
BILIRUBIN TOTAL: 0.4 mg/dL (ref 0.3–1.2)
BUN: 37 mg/dL — ABNORMAL HIGH (ref 6–20)
CALCIUM: 9 mg/dL (ref 8.9–10.3)
CO2: 23 mmol/L (ref 22–32)
Chloride: 108 mmol/L (ref 101–111)
Creatinine, Ser: 1.57 mg/dL — ABNORMAL HIGH (ref 0.44–1.00)
GFR, EST AFRICAN AMERICAN: 31 mL/min — AB (ref >60)
GFR, EST NON AFRICAN AMERICAN: 27 mL/min — AB (ref >60)
GLUCOSE: 78 mg/dL (ref 65–99)
Potassium: 4.6 mmol/L (ref 3.5–5.1)
Sodium: 142 mmol/L (ref 135–145)
TOTAL PROTEIN: 6 g/dL — AB (ref 6.5–8.1)

## 2015-12-05 LAB — CBC WITH DIFFERENTIAL/PLATELET
BASOS ABS: 0 10*3/uL (ref 0.0–0.1)
BASOS PCT: 1 %
Eosinophils Absolute: 0.2 10*3/uL (ref 0.0–0.7)
Eosinophils Relative: 4 %
HEMATOCRIT: 31 % — AB (ref 36.0–46.0)
Hemoglobin: 9.8 g/dL — ABNORMAL LOW (ref 12.0–15.0)
Lymphocytes Relative: 44 %
Lymphs Abs: 2 10*3/uL (ref 0.7–4.0)
MCH: 28 pg (ref 26.0–34.0)
MCHC: 31.6 g/dL (ref 30.0–36.0)
MCV: 88.6 fL (ref 78.0–100.0)
MONO ABS: 0.4 10*3/uL (ref 0.1–1.0)
Monocytes Relative: 9 %
NEUTROS ABS: 1.9 10*3/uL (ref 1.7–7.7)
Neutrophils Relative %: 42 %
PLATELETS: 214 10*3/uL (ref 150–400)
RBC: 3.5 MIL/uL — ABNORMAL LOW (ref 3.87–5.11)
RDW: 14.6 % (ref 11.5–15.5)
WBC: 4.5 10*3/uL (ref 4.0–10.5)

## 2015-12-05 LAB — TSH: TSH: 0.139 u[IU]/mL — ABNORMAL LOW (ref 0.350–4.500)

## 2015-12-05 LAB — GLUCOSE, CAPILLARY
GLUCOSE-CAPILLARY: 67 mg/dL (ref 65–99)
GLUCOSE-CAPILLARY: 68 mg/dL (ref 65–99)
GLUCOSE-CAPILLARY: 131 mg/dL — AB (ref 65–99)
Glucose-Capillary: 81 mg/dL (ref 65–99)
Glucose-Capillary: 163 mg/dL — ABNORMAL HIGH (ref 65–99)

## 2015-12-05 LAB — SEDIMENTATION RATE: Sed Rate: 38 mm/hr — ABNORMAL HIGH (ref 0–22)

## 2015-12-05 MED ORDER — DEXTROSE-NACL 5-0.9 % IV SOLN
INTRAVENOUS | Status: DC
  Administered 2015-12-05: 13:00:00 via INTRAVENOUS

## 2015-12-05 MED ORDER — SODIUM CHLORIDE 0.9 % IV SOLN
INTRAVENOUS | Status: DC
  Administered 2015-12-05: 11:00:00 via INTRAVENOUS

## 2015-12-05 NOTE — Clinical Social Work Note (Signed)
Clinical Social Work Assessment  Patient Details  Name: [name] MRN: [id number] Date of Birth: 10/20/1919  Date of referral:  12/05/15               Reason for consult:  Facility Placement                Permission sought to share information with:  Chartered certified accountant granted to share information::  Yes, Verbal Permission Granted  Name::        Agency::     Relationship::     Contact Information:     Housing/Transportation Living arrangements for the past 2 months:  Single Family Home Source of Information:  Patient Patient Interpreter Needed:  None Criminal Activity/Legal Involvement Pertinent to Current Situation/Hospitalization:    Significant Relationships:  Adult Children, Friend Lives with:  Self Do you feel safe going back to the place where you live?  No Need for family participation in patient care:  No (Coment)  Care giving concerns:  CSW reviewed PT evaluation recommending SNF at discharge.    Social Worker assessment / plan:  CSW spoke with patient at bedside re: discharge planning.   Employment status:  Retired Forensic scientist:  Medicare PT Recommendations:  [place] / Referral to community resources:  [place]  Patient/Family's Response to care:  Patient states that she has been to [facility] in the past but would prefer to go to [facility] at discharge. CSW left message for Whitney at [facility] - awaiting response re: bed availability.   Patient/Family's Understanding of and Emotional Response to Diagnosis, Current Treatment, and Prognosis:    Emotional Assessment Appearance:  Appears younger than stated age Attitude/Demeanor/Rapport:    Affect (typically observed):  Calm, Accepting, Quiet, Pleasant Orientation:  Oriented to Self, Oriented to Place, Oriented to  Time, Oriented to Situation Alcohol / Substance use:    Psych involvement (Current and /or in the  community):     Discharge Needs  Concerns to be addressed:    Readmission within the last 30 days:    Current discharge risk:    Barriers to Discharge:      Standley Brooking, LCSW 12/05/2015, 3:42 PM

## 2015-12-05 NOTE — Progress Notes (Signed)
Progress Note   [name] [id number] DOB: 05/26/1919 DOA: 12/04/2015 PCP: Benito Mccreedy, MD   Brief Narrative:   [name] is an 81 y.o. female with a PMH of hypertension, DVT on Apixaban, diabetes mellitus type 2, hypothyroidism who was admitted 12/04/15 with a chief complaint of weakness and fatigue in the setting of recent antibiotic use for treatment of a UTI. Upon initial evaluation in the ED, the patient was found to have acute kidney injury with mild hyperkalemia.  Assessment/Plan:   Principal Problem:   AKI (acute kidney injury) ([place]) with weakness and fatigue - Continue gentle IV fluids and holding nephrotoxins including ARB and lasix. Creatinine trending down. - Given history of PMR, CK and ESR were checked. CK WNL. ESR pending. - Physical therapy evaluation requested.  Active Problems:   Normocytic anemia - Likely anemia of chronic disease.    Hyperkalemia - Likely from dehydration/volume contraction. Resolved with IV fluids.    Essential hypertension - Antihypertensives currently on hold.    Hypothyroidism - Continue Synthroid. Check TSH given weakness/fatigue.    Type 2 diabetes mellitus (HCC) with neuropathy/hypoglycemia - Currently being managed with insulin sensitive SSI 3 times a day. Discontinue given hypoglycemic episodes. - Continue Neurontin for neuropathy. - Hemoglobin A1c 6.4% on 03/01/15.    Hx pulmonary embolism - Continue Eliquis.    DVT Prophylaxis - Eliquis ordered.   Family Communication/Anticipated D/C date and plan/Code Status   Family Communication: Son lives nearby, declines my offer to call him. Disposition Plan: Home when stable.  Lives alone. Anticipated D/C date:   1-2 days depending on stability of glucose and creatinine. Code Status: DO NOT RESUSCITATE   IV Access:    Peripheral IV   Procedures and diagnostic studies:   Dg Chest 2 View  12/04/2015  CLINICAL DATA:  Chest pain, generalize  weakness, low back pain and foul-smelling urine EXAM: CHEST  2 VIEW COMPARISON:  02/28/2015 FINDINGS: The lungs are hyperinflated likely secondary to COPD. There is no focal parenchymal opacity. There is no pleural effusion or pneumothorax. The heart and mediastinal contours are unremarkable. The osseous structures are unremarkable. There is evidence of prior rotator cuff repair. IMPRESSION: No active cardiopulmonary disease. Electronically Signed   By: Kathreen Devoid   On: 12/04/2015 18:47     Medical Consultants:    None.  Anti-Infectives:   Anti-infectives    None      Subjective:   [name] says she feels weak.  Denies pain, dyspnea.  Poor appetite.  Last BM was yesterday.  Has had some nausea and vomiting.  Says this happens "every so often".  Objective:    Filed Vitals:   12/04/15 2000 12/04/15 2048 12/05/15 0500 12/05/15 0505  BP: 139/75 145/54  139/57  Pulse: 58 50  49  Temp:    97.8 F (36.6 C)  TempSrc:    Oral  Resp: '21 20  20  '$ Height:  5' (1.524 m)    Weight:  48.6 kg (107 lb 2.3 oz) 49.2 kg (108 lb 7.5 oz)   SpO2: 97% 100%  100%    Intake/Output Summary (Last 24 hours) at 12/05/15 0730 Last data filed at 12/05/15 0700  Gross per 24 hour  Intake 866.25 ml  Output      0 ml  Net 866.25 ml   Filed Weights   12/04/15 2048 12/05/15 0500  Weight: 48.6 kg (107 lb 2.3 oz) 49.2 kg (108 lb 7.5 oz)  Exam: Gen:  NAD, sleepy Cardiovascular:  RRR, No M/R/G Respiratory:  Lungs CTAB, diminished Gastrointestinal:  Abdomen soft, NT/ND, + BS Extremities:  No C/E/C   Data Reviewed:    Labs: Basic Metabolic Panel:  Recent Labs Lab 12/04/15 1740 12/04/15 2300 12/05/15 0525  NA 140 143 142  K 5.7* 4.8 4.6  CL 106 110 108  CO2 '27 24 23  '$ GLUCOSE 129* 87 78  BUN 45* 43* 37*  CREATININE 1.79* 1.60* 1.57*  CALCIUM 9.1 8.7* 9.0   GFR Estimated Creatinine Clearance: 15.7 mL/min (by C-G formula based on Cr of 1.57). Liver Function Tests:  Recent  Labs Lab 12/04/15 1740 12/05/15 0525  AST 26 26  ALT 16 15  ALKPHOS 80 81  BILITOT 0.4 0.4  PROT 5.7* 6.0*  ALBUMIN 3.3* 3.3*   CBC:  Recent Labs Lab 12/04/15 1740 12/05/15 0525  WBC 4.4 4.5  NEUTROABS 2.6 1.9  HGB 9.3* 9.8*  HCT 29.6* 31.0*  MCV 87.8 88.6  PLT 229 214   Cardiac Enzymes:  Recent Labs Lab 12/04/15 2300  CKTOTAL 34  TROPONINI <0.03   CBG: No results for input(s): GLUCAP in the last 168 hours. Hgb A1c: No results for input(s): HGBA1C in the last 72 hours. Thyroid function studies: No results for input(s): TSH, T4TOTAL, T3FREE, THYROIDAB in the last 72 hours.  Invalid input(s): FREET3 Sepsis Labs:  Recent Labs Lab 12/04/15 1740 12/04/15 1838 12/05/15 0525  WBC 4.4  --  4.5  LATICACIDVEN  --  0.75  --    Urinalysis    Component Value Date/Time   COLORURINE YELLOW 12/04/2015 1858   APPEARANCEUR CLEAR 12/04/2015 1858   LABSPEC 1.013 12/04/2015 1858   PHURINE 7.5 12/04/2015 1858   GLUCOSEU NEGATIVE 12/04/2015 1858   HGBUR NEGATIVE 12/04/2015 1858   BILIRUBINUR NEGATIVE 12/04/2015 1858   KETONESUR NEGATIVE 12/04/2015 1858   PROTEINUR 30* 12/04/2015 1858   UROBILINOGEN 0.2 02/28/2015 1542   NITRITE NEGATIVE 12/04/2015 1858   LEUKOCYTESUR NEGATIVE 12/04/2015 1858   Microbiology No results found for this or any previous visit (from the past 240 hour(s)).   Medications:   . apixaban  2.5 mg Oral BID  . cyanocobalamin  1,000 mcg Intramuscular Q30 days  . diclofenac sodium  2 g Topical q morning - 10a  . dicyclomine  10 mg Oral TID AC  . feeding supplement (GLUCERNA SHAKE)  237 mL Oral TID PC & HS  . gabapentin  100 mg Oral BID  . insulin aspart  0-9 Units Subcutaneous TID WC  . l-methylfolate-B6-B12  1 tablet Oral Daily  . levothyroxine  75 mcg Oral QAC breakfast  . mirabegron ER  25 mg Oral Daily  . mirtazapine  15 mg Oral QHS  . pantoprazole  40 mg Oral Daily  . polyethylene glycol  17 g Oral Daily  . ropinirole  5 mg Oral QHS     Continuous Infusions: . sodium chloride 75 mL/hr at 12/05/15 0600    Time spent: 25 minutes.   LOS: 1 day   [name]  [facility] Pager [phone]. If unable to reach me by pager, please call my cell phone at [phone].  *Please refer to [email or web address], password [email or web address] to get updated schedule on who will round on this patient, as hospitalists switch teams weekly. If 7PM-7AM, please contact night-coverage at [email or web address], password [email or web address] for any overnight needs.  12/05/2015, 7:30 AM

## 2015-12-05 NOTE — Clinical Social Work Placement (Signed)
   CLINICAL SOCIAL WORK PLACEMENT  NOTE  Date:  12/05/2015  Patient Details  Name: [name] MRN: [id number] Date of Birth: 28-Jul-1919  Clinical Social Work is seeking post-discharge placement for this patient at the [place] level of care (*CSW will initial, date and re-position this form in  chart as items are completed):  Yes   Patient/family provided with [place] Work Department's list of facilities offering this level of care within the geographic area requested by the patient (or if unable, by the patient's family).  Yes   Patient/family informed of their freedom to choose among providers that offer the needed level of care, that participate in Medicare, Medicaid or managed care program needed by the patient, have an available bed and are willing to accept the patient.  Yes   Patient/family informed of [facility]'s ownership interest in [facility] and [facility], as well as of the fact that they are under no obligation to receive care at these facilities.  PASRR submitted to EDS on       PASRR number received on       Existing PASRR number confirmed on 12/05/15     FL2 transmitted to all facilities in geographic area requested by pt/family on       FL2 transmitted to all facilities within larger geographic area on       Patient informed that his/her managed care company has contracts with or will negotiate with certain facilities, including the following:            Patient/family informed of bed offers received.  Patient chooses bed at       Physician recommends and patient chooses bed at      Patient to be transferred to   on  .  Patient to be transferred to facility by       Patient family notified on   of transfer.  Name of family member notified:        PHYSICIAN       Additional Comment:    _______________________________________________ Standley Brooking, LCSW 12/05/2015, 3:43 PM

## 2015-12-05 NOTE — NC FL2 (Signed)
[place] LEVEL OF CARE SCREENING TOOL     IDENTIFICATION  Patient Name: [name] Birthdate: 1918-12-23 Sex: female Admission Date (Current Location): 12/04/2015  [facility] and [place] Number:  Herbalist and Address:  [facility],  [place] [address], [place]      Provider Number: [id number]  Attending Physician Name and Address:  Venetia Maxon Rama, MD  Relative Name and Phone Number:       Current Level of Care: Hospital Recommended Level of Care: [place] Prior Approval Number:    Date Approved/Denied:   PASRR Number:  ([id number])  Discharge Plan: SNF    Current Diagnoses: Patient Active Problem List   Diagnosis Date Noted  . AKI (acute kidney injury) ([place]) 12/04/2015  . Rectal mass   . Pancreas divisum 03/02/2015  . Physical deconditioning 03/01/2015  . Pancreatic cyst   . Hiatal hernia- moderate to large 10/06/2014  . Heme + stool 10/04/2014  . Crohn's ileocolitis ([place]) 10/04/2014  . 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 Arthritis   . Hx pulmonary embolism   . Hypothyroidism 04/11/2014  . Type 2 diabetes mellitus ([place]) 04/11/2014  . Anemia of chronic disease 04/03/2014  . Overactive bladder 01/07/2012  . DVT of leg (deep venous thrombosis) ([place]) 01/07/2012  . RESTLESS LEG SYNDROME 03/09/2008  . Essential hypertension 03/09/2008  . ESOPHAGEAL STRICTURE 03/09/2008  . Diverticulosis of large intestine 03/09/2008  . Polymyalgia rheumatica ([place]) 03/09/2008  . INSOMNIA 03/09/2008  . SLEEP APNEA 03/09/2008    Orientation RESPIRATION BLADDER Height & Weight    Self, Time, Situation, Place  Normal Incontinent 5' (152.4 cm) 108 lbs.  BEHAVIORAL SYMPTOMS/MOOD NEUROLOGICAL BOWEL NUTRITION STATUS      Continent Diet (Heart healthy/Carb modified)  AMBULATORY STATUS COMMUNICATION OF NEEDS Skin   Limited Assist Verbally Normal                       Personal Care Assistance Level of Assistance   Bathing, Feeding, Dressing Bathing Assistance: Limited assistance Feeding assistance: Limited assistance Dressing Assistance: Limited assistance     Functional Limitations Info             SPECIAL CARE FACTORS FREQUENCY                       Contractures      Additional Factors Info  Code Status, Allergies Code Status Info:  (DNR) Allergies Info:  (Allergies:  Sulfa Antibiotics, Penicillins)           Current Medications (12/05/2015):  This is the current hospital active medication list Current Facility-Administered Medications  Medication Dose Route Frequency Provider Last Rate Last Dose  . acetaminophen (TYLENOL) tablet 650 mg  650 mg Oral Q6H PRN Rise Patience, MD       Or  . acetaminophen (TYLENOL) suppository 650 mg  650 mg Rectal Q6H PRN Rise Patience, MD      . apixaban [facility]) tablet 2.5 mg  2.5 mg Oral BID Rise Patience, MD   2.5 mg at 12/05/15 0947  . cyanocobalamin ((VITAMIN B-12)) injection 1,000 mcg  1,000 mcg Intramuscular Q30 days Rise Patience, MD   1,000 mcg at 12/05/15 0947  . dextrose 5 %-0.9 % sodium chloride infusion   Intravenous Continuous Venetia Maxon Rama, MD 50 mL/hr at 12/05/15 1256    . diclofenac sodium (VOLTAREN) 1 % transdermal gel 2 g  2 g Topical q  morning - 10a Rise Patience, MD   2 g at 12/05/15 1002  . dicyclomine (BENTYL) capsule 10 mg  10 mg Oral TID AC Rise Patience, MD   10 mg at 12/05/15 1228  . feeding supplement (GLUCERNA SHAKE) (GLUCERNA SHAKE) liquid 237 mL  237 mL Oral TID PC & HS Rise Patience, MD   237 mL at 12/05/15 1300  . gabapentin (NEURONTIN) capsule 100 mg  100 mg Oral BID Rise Patience, MD   100 mg at 12/05/15 0947  . hydrALAZINE (APRESOLINE) injection 10 mg  10 mg Intravenous Q4H PRN Rise Patience, MD      . hyoscyamine (LEVSIN SL) SL tablet 0.125 mg  0.125 mg Sublingual Q6H PRN Rise Patience, MD      . l-methylfolate-B6-B12 [facility]) 3-35-2 MG per  tablet 1 tablet  1 tablet Oral Daily Rise Patience, MD   1 tablet at 12/05/15 [phone]  . levothyroxine (SYNTHROID, LEVOTHROID) tablet 75 mcg  75 mcg Oral QAC breakfast Rise Patience, MD   75 mcg at 12/05/15 0744  . mirabegron ER (MYRBETRIQ) tablet 25 mg  25 mg Oral Daily Rise Patience, MD   25 mg at 12/05/15 0947  . mirtazapine (REMERON) tablet 15 mg  15 mg Oral QHS Rise Patience, MD   15 mg at 12/04/15 2354  . ondansetron (ZOFRAN) tablet 4 mg  4 mg Oral Q6H PRN Rise Patience, MD       Or  . ondansetron [facility]) injection 4 mg  4 mg Intravenous Q6H PRN Rise Patience, MD   4 mg at 12/05/15 0955  . oxyCODONE (Oxy IR/ROXICODONE) immediate release tablet 10 mg  10 mg Oral Q6H PRN Rise Patience, MD   10 mg at 12/04/15 2358  . pantoprazole (PROTONIX) EC tablet 40 mg  40 mg Oral Daily Rise Patience, MD   40 mg at 12/05/15 0947  . polyethylene glycol (MIRALAX / GLYCOLAX) packet 17 g  17 g Oral Daily Rise Patience, MD   17 g at 12/05/15 0948  . rOPINIRole (REQUIP) tablet 5 mg  5 mg Oral QHS Rise Patience, MD   5 mg at 12/04/15 2354     Discharge Medications: Please see discharge summary for a list of discharge medications.  Relevant Imaging Results:  Relevant Lab Results:   Additional Information  (SSN: [id number])  Standley Brooking, LCSW

## 2015-12-05 NOTE — Progress Notes (Signed)
Hypoglycemic Event  CBG: 68   Treatment: 15 GM carbohydrate snack  Symptoms: None  Possible Reasons for Event: Inadequate meal intake  Comments/MD notified:paged Dr. Rockne Menghini, fluids changed to [place], [place]

## 2015-12-05 NOTE — Care Management Note (Signed)
Case Management Note  Patient Details  Name: [name] MRN: [id number] Date of Birth: [birth year]  Subjective/Objective:  81 y/o f admitted w/AKI. Hx: CHF. From home.PT cons-await recc.                  Action/Plan:d/c plan home.   Expected Discharge Date:                  Expected Discharge Plan:  [place]  In-House Referral:     Discharge planning Services  CM Consult  Post Acute Care Choice:    Choice offered to:     DME Arranged:    DME Agency:     HH Arranged:    [place] Agency:     Status of Service:  In process, will continue to follow  Medicare Important Message Given:    Date Medicare IM Given:    Medicare IM give by:    Date Additional Medicare IM Given:    Additional Medicare Important Message give by:     If discussed at [place] of Stay Meetings, dates discussed:    Additional Comments:  Dessa Phi, RN 12/05/2015, 12:05 PM

## 2015-12-05 NOTE — Evaluation (Signed)
Physical Therapy Evaluation Patient Details Name: [name] MRN: [id number] DOB: 1919/03/12 Today's Date: 12/05/2015   History of Present Illness  [name] is a 81 y.o. female with history of hypertension, DVT on Apixaban, diabetes mellitus type 2, hypothyroidism presents to the ER 12/27 because of weakness and fatigue. Patient was recently treated with antibiotic course for UTI. Patient denies any chest pain or shortness of breath. Patient states she feels very weak and tired. In the ER labs reveal acute renal failure with mild hyperkalemia.   Clinical Impression  Patient is very pleasant, appears weak. Patient did ambulate  X 55'. Patient reports that her neighbors are avaialble to assist her. Uncertain if [phone] caregivers are available.  Pt admitted with above diagnosis. Pt currently with functional limitations due to the deficits listed below (see PT Problem List).  Pt will benefit from skilled PT to increase their independence and safety with mobility to allow discharge to the venue listed below.       Follow Up Recommendations SNF;Supervision/Assistance - 24 hour/unless  She has 24/7 caregivers, then HHPT.    Equipment Recommendations  None recommended by PT    Recommendations for Other Services       Precautions / Restrictions Precautions Precautions: Fall      Mobility  Bed Mobility               General bed mobility comments: in recliner  Transfers Overall transfer level: Needs assistance Equipment used: Rolling walker (2 wheeled) Transfers: Sit to/from Stand Sit to Stand: Min guard         General transfer comment: steady assist upon standing.  Ambulation/Gait Ambulation/Gait assistance: Min assist Ambulation Distance (Feet): 55 Feet Assistive device: Rolling walker (2 wheeled) Gait Pattern/deviations: Step-through pattern;Decreased stride length   Gait velocity interpretation: Below normal speed for age/gender General Gait Details: assist  to guide RW, cues for turning.  Stairs            Wheelchair Mobility    Modified Rankin (Stroke Patients Only)       Balance Overall balance assessment: Needs assistance Sitting-balance support: No upper extremity supported;Feet supported Sitting balance-Leahy Scale: Good     Standing balance support: During functional activity;Bilateral upper extremity supported Standing balance-Leahy Scale: Fair                               Pertinent Vitals/Pain Pain Assessment: No/denies pain    Home Living Family/patient expects to be discharged to:: Private residence Living Arrangements: Alone Available Help at Discharge: Friend(s);Neighbor;Available PRN/intermittently Type of Home: House Home Access: Stairs to enter Entrance Stairs-Rails: Psychiatric nurse of Steps: 5 Home Layout: One level Home Equipment: Walker - 2 wheels;Walker - 4 wheels;Cane - quad;Grab bars - tub/shower;Tub bench;Adaptive equipment Additional Comments: not certain caregiver availability based on Patient history    Prior Function Level of Independence: Independent with assistive device(s)         Comments: cargiver prepares meals, supervises shower.     Hand Dominance        Extremity/Trunk Assessment   Upper Extremity Assessment: Generalized weakness           Lower Extremity Assessment: Generalized weakness      Cervical / Trunk Assessment: Kyphotic  Communication   Communication: HOH  Cognition Arousal/Alertness: Awake/alert Behavior During Therapy: WFL for tasks assessed/performed Overall Cognitive Status: Within Functional Limits for tasks assessed  General Comments      Exercises        Assessment/Plan    PT Assessment Patient needs continued PT services  PT Diagnosis Difficulty walking;Generalized weakness   PT Problem List Decreased strength;Decreased activity tolerance;Decreased balance;Decreased  mobility;Decreased knowledge of precautions;Decreased safety awareness;Decreased knowledge of use of DME  PT Treatment Interventions DME instruction;Gait training;Stair training;Functional mobility training;Therapeutic activities;Therapeutic exercise;Patient/family education   PT Goals (Current goals can be found in the Care Plan section) Acute Rehab PT Goals Patient Stated Goal: to go home PT Goal Formulation: With patient Time For Goal Achievement: 12/19/15 Potential to Achieve Goals: Good    Frequency Min 3X/week   Barriers to discharge Decreased caregiver support      Co-evaluation               End of Session   Activity Tolerance: Patient limited by fatigue Patient left: in chair;with nursing/sitter in room Nurse Communication: Mobility status         Time: 1209-1225 PT Time Calculation (min) (ACUTE ONLY): 16 min   Charges:   PT Evaluation $Initial PT Evaluation Tier I: 1 Procedure     PT G CodesClaretha Cooper 12/05/2015, 1:51 PM Tresa Endo PT [phone]

## 2015-12-05 NOTE — Progress Notes (Signed)
Hypoglycemic Event  CBG: 67  Treatment: 15 GM carbohydrate snack  Symptoms: None  Follow-up CBG: Time: 0825 CBG Result: 81   Possible Reasons for Event: Unknown  Comments/MD notified: Dr. Rockne Menghini paged    Leticia Penna A

## 2015-12-06 LAB — RENAL FUNCTION PANEL
ALBUMIN: 2.8 g/dL — AB (ref 3.5–5.0)
Anion gap: 9 (ref 5–15)
BUN: 32 mg/dL — AB (ref 6–20)
CHLORIDE: 110 mmol/L (ref 101–111)
CO2: 25 mmol/L (ref 22–32)
CREATININE: 1.49 mg/dL — AB (ref 0.44–1.00)
Calcium: 8.4 mg/dL — ABNORMAL LOW (ref 8.9–10.3)
GFR calc Af Amer: 33 mL/min — ABNORMAL LOW (ref >60)
GFR, EST NON AFRICAN AMERICAN: 29 mL/min — AB (ref >60)
Glucose, Bld: 115 mg/dL — ABNORMAL HIGH (ref 65–99)
PHOSPHORUS: 2.9 mg/dL (ref 2.5–4.6)
Potassium: 4.3 mmol/L (ref 3.5–5.1)
Sodium: 144 mmol/L (ref 135–145)

## 2015-12-06 LAB — URINE CULTURE: CULTURE: NO GROWTH

## 2015-12-06 LAB — GLUCOSE, CAPILLARY
GLUCOSE-CAPILLARY: 104 mg/dL — AB (ref 65–99)
Glucose-Capillary: 108 mg/dL — ABNORMAL HIGH (ref 65–99)

## 2015-12-06 MED ORDER — SODIUM CHLORIDE 0.9 % IV SOLN
INTRAVENOUS | Status: DC
  Administered 2015-12-06 – 2015-12-07 (×2): via INTRAVENOUS

## 2015-12-06 NOTE — Progress Notes (Signed)
[name], pt's emergency contact, updated on pt's transfer and made aware of pt's new room number.

## 2015-12-06 NOTE — Progress Notes (Signed)
Pt transferred from 4th floor to 1340. VS stable, no complaints at this time. I agree with previously charted assessment on this shift. Hortencia Conradi RN

## 2015-12-06 NOTE — Progress Notes (Signed)
CSW continuing to follow.   CSW followed up with pt at bedside to discuss disposition planning.   CSW offered pt bed offers. CSW explained to pt that [facility] was not able to offer pt a bed. Pt reviewed bed offers and expressed interest in [facility]. Pt son arrived during visit. Pt son discussed that pt funds are limited at this time and pt is in copayment days for SNF and pt is unable to afford SNF. CSW discussed that PT recommending 24 hour assistance at home. Pt son states that his sister-in-law can provide some assistance with pt care at home and pt lives in a condo community where everyone "looks out for one another". Pt in agreement to plan to return home with home health services. Pt states that she has used [facility] in the past and would like for [facility] to follow pt at home. Pt reports that she has all the equipment that she needs tomorrow. CSW notified RNCM that pt wishes to return home with home health services including RN, aide, PT, OT, and Education officer, museum.   No further social work needs identified at this time.  CSW signing off.   [name], MSW, [place] Work [phone]

## 2015-12-06 NOTE — Progress Notes (Addendum)
Progress Note   [name] [id number] DOB: 05/04/1919 DOA: 12/04/2015 PCP: Benito Mccreedy, MD   Brief Narrative:   [name] is an 81 y.o. female with a PMH of hypertension, DVT on Apixaban, diabetes mellitus type 2, hypothyroidism who was admitted 12/04/15 with a chief complaint of weakness and fatigue in the setting of recent antibiotic use for treatment of a UTI. Upon initial evaluation in the ED, the patient was found to have acute kidney injury with mild hyperkalemia.  Assessment/Plan:   Principal Problem:   AKI (acute kidney injury) ([place]) with weakness and fatigue - Continue gentle IV fluids and holding nephrotoxins including ARB and lasix. Creatinine trending down. - Baseline creatinine 0.8-0.9. - Given history of PMR, CK and ESR were checked. CK WNL. ESR 38. - Physical therapy evaluation done 12/05/15, SNF versus 24 hour supervision recommended.  Active Problems:   Normocytic anemia - Likely anemia of chronic disease.    Hyperkalemia - Likely from dehydration/volume contraction. Resolved with IV fluids.    Essential hypertension - Antihypertensives currently on hold.    Hypothyroidism - Continue Synthroid. TSH slightly low at 0.139.    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 - CBGs now 130s-160s. Discontinue dextrose and IV fluids. - Continue Neurontin for neuropathy. - Hemoglobin A1c 6.4% on 03/01/15.    Hx pulmonary embolism - Continue Eliquis.    DVT Prophylaxis - Eliquis ordered.   Family Communication/Anticipated D/C date and plan/Code Status   Family Communication: Gene (son) updated by telephone. Disposition Plan: SNF versus 24 hour supervision.  Lives alone. Anticipated D/C date:   12/07/15 to SNF if bed available for patient has confirmed 24-hour supervision at home. Code Status: DO NOT RESUSCITATE   IV Access:     Peripheral IV   Procedures and diagnostic studies:   Dg Chest 2 View  12/04/2015  CLINICAL DATA:  Chest pain, generalize weakness, low back pain and foul-smelling urine EXAM: CHEST  2 VIEW COMPARISON:  02/28/2015 FINDINGS: The lungs are hyperinflated likely secondary to COPD. There is no focal parenchymal opacity. There is no pleural effusion or pneumothorax. The heart and mediastinal contours are unremarkable. The osseous structures are unremarkable. There is evidence of prior rotator cuff repair. IMPRESSION: No active cardiopulmonary disease. Electronically Signed   By: Kathreen Devoid   On: 12/04/2015 18:47     Medical Consultants:    None.  Anti-Infectives:   Anti-infectives    None      Subjective:   [name] continues to feel weak.  Denies dyspnea.  Appetite picking up some.  No N/V. Has had some neck pain.  Objective:    Filed Vitals:   12/05/15 0505 12/05/15 1442 12/05/15 2042 12/06/15 0407  BP: 139/57 148/52 116/50 171/53  Pulse: 49 52 51 51  Temp: 97.8 F (36.6 C) 97.2 F (36.2 C) 97.6 F (36.4 C) 98.1 F (36.7 C)  TempSrc: Oral Oral Oral Oral  Resp: 20 20 18 16   Height:      Weight:    51.2 kg (112 lb 14 oz)  SpO2: 100% 100% 98% 98%    Intake/Output Summary (Last 24 hours) at 12/06/15 0737 Last data filed at 12/05/15 2300  Gross per 24 hour  Intake 743.33 ml  Output      0 ml  Net 743.33 ml   Filed Weights   12/04/15 2048 12/05/15 0500 12/06/15 0407  Weight:  48.6 kg (107 lb 2.3 oz) 49.2 kg (108 lb 7.5 oz) 51.2 kg (112 lb 14 oz)    Exam: Gen:  NAD, sleepy Cardiovascular:  RRR, No M/R/G Respiratory:  Lungs CTAB, diminished Gastrointestinal:  Abdomen soft, NT/ND, + BS Extremities:  No C/E/C   Data Reviewed:    Labs: Basic Metabolic Panel:  Recent Labs Lab 12/04/15 1740 12/04/15 2300 12/05/15 0525 12/06/15 0558  NA 140 143 142 144  K 5.7* 4.8 4.6 4.3  CL 106 110 108 110  CO2 27 24 23 25   GLUCOSE 129* 87 78 115*  BUN  45* 43* 37* 32*  CREATININE 1.79* 1.60* 1.57* 1.49*  CALCIUM 9.1 8.7* 9.0 8.4*  PHOS  --   --   --  2.9   GFR Estimated Creatinine Clearance: 16.6 mL/min (by C-G formula based on Cr of 1.49). Liver Function Tests:  Recent Labs Lab 12/04/15 1740 12/05/15 0525 12/06/15 0558  AST 26 26  --   ALT 16 15  --   ALKPHOS 80 81  --   BILITOT 0.4 0.4  --   PROT 5.7* 6.0*  --   ALBUMIN 3.3* 3.3* 2.8*   CBC:  Recent Labs Lab 12/04/15 1740 12/05/15 0525  WBC 4.4 4.5  NEUTROABS 2.6 1.9  HGB 9.3* 9.8*  HCT 29.6* 31.0*  MCV 87.8 88.6  PLT 229 214   Cardiac Enzymes:  Recent Labs Lab 12/04/15 2300  CKTOTAL 55  TROPONINI <0.03   CBG:  Recent Labs Lab 12/05/15 0735 12/05/15 0824 12/05/15 1206 12/05/15 1705 12/05/15 2058  GLUCAP 67 81 68 131* 163*   Hgb A1c: No results for input(s): HGBA1C in the last 72 hours. Thyroid function studies:  Recent Labs  12/05/15 1208  TSH 0.139*   Sepsis Labs:  Recent Labs Lab 12/04/15 1740 12/04/15 1838 12/05/15 0525  WBC 4.4  --  4.5  LATICACIDVEN  --  0.75  --    Urinalysis    Component Value Date/Time   COLORURINE YELLOW 12/04/2015 1858   APPEARANCEUR CLEAR 12/04/2015 1858   LABSPEC 1.013 12/04/2015 1858   PHURINE 7.5 12/04/2015 1858   GLUCOSEU NEGATIVE 12/04/2015 1858   HGBUR NEGATIVE 12/04/2015 1858   BILIRUBINUR NEGATIVE 12/04/2015 1858   KETONESUR NEGATIVE 12/04/2015 1858   PROTEINUR 30* 12/04/2015 1858   UROBILINOGEN 0.2 02/28/2015 1542   NITRITE NEGATIVE 12/04/2015 1858   LEUKOCYTESUR NEGATIVE 12/04/2015 1858   Microbiology No results found for this or any previous visit (from the past 240 hour(s)).   Medications:   . apixaban  2.5 mg Oral BID  . cyanocobalamin  1,000 mcg Intramuscular Q30 days  . diclofenac sodium  2 g Topical q morning - 10a  . dicyclomine  10 mg Oral TID AC  . feeding supplement (GLUCERNA SHAKE)  237 mL Oral TID PC & HS  . gabapentin  100 mg Oral BID  . l-methylfolate-B6-B12  1  tablet Oral Daily  . levothyroxine  75 mcg Oral QAC breakfast  . mirabegron ER  25 mg Oral Daily  . mirtazapine  15 mg Oral QHS  . pantoprazole  40 mg Oral Daily  . polyethylene glycol  17 g Oral Daily  . ropinirole  5 mg Oral QHS   Continuous Infusions: . dextrose 5 % and 0.9% NaCl 50 mL/hr at 12/05/15 1256    Time spent: 25 minutes.   LOS: 2 days   RAMA,CHRISTINA  [facility] Pager [phone]. If unable to reach me by pager, please call my cell  phone at [phone].  *Please refer to [email or web address], password [email or web address] to get updated schedule on who will round on this patient, as hospitalists switch teams weekly. If 7PM-7AM, please contact night-coverage at [email or web address], password [email or web address] for any overnight needs.  12/06/2015, 7:37 AM

## 2015-12-07 LAB — BASIC METABOLIC PANEL
Anion gap: 7 (ref 5–15)
BUN: 31 mg/dL — ABNORMAL HIGH (ref 6–20)
CALCIUM: 8.2 mg/dL — AB (ref 8.9–10.3)
CHLORIDE: 113 mmol/L — AB (ref 101–111)
CO2: 23 mmol/L (ref 22–32)
CREATININE: 1.23 mg/dL — AB (ref 0.44–1.00)
GFR calc non Af Amer: 36 mL/min — ABNORMAL LOW (ref >60)
GFR, EST AFRICAN AMERICAN: 42 mL/min — AB (ref >60)
Glucose, Bld: 123 mg/dL — ABNORMAL HIGH (ref 65–99)
Potassium: 4.3 mmol/L (ref 3.5–5.1)
SODIUM: 143 mmol/L (ref 135–145)

## 2015-12-07 LAB — GLUCOSE, CAPILLARY
GLUCOSE-CAPILLARY: 88 mg/dL (ref 65–99)
GLUCOSE-CAPILLARY: 86 mg/dL (ref 65–99)
Glucose-Capillary: 97 mg/dL (ref 65–99)
Glucose-Capillary: 141 mg/dL — ABNORMAL HIGH (ref 65–99)

## 2015-12-07 NOTE — Progress Notes (Addendum)
Progress Note   AUNISTY REALI [id number] DOB: 03/26/1919 DOA: 12/04/2015 PCP: Benito Mccreedy, MD   Brief Narrative:   [name] is an 81 y.o. female with a PMH of hypertension, DVT on Apixaban, diabetes mellitus type 2, hypothyroidism who was admitted 12/04/15 with a chief complaint of weakness and fatigue in the setting of recent antibiotic use for treatment of a UTI. Upon initial evaluation in the ED, the patient was found to have acute kidney injury with mild hyperkalemia.  Assessment/Plan:   Principal Problem:   AKI (acute kidney injury) ([place]) with weakness and fatigue - Continue gentle IV fluids and holding nephrotoxins including ARB and lasix. Creatinine trending down. - Baseline creatinine 0.8-0.9. Creatinine still slightly elevated over usual baseline values. - Given history of PMR, CK and ESR were checked. CK WNL. ESR 38. - Physical therapy evaluation done 12/05/15, SNF versus 24 hour supervision recommended. - We'll increase IV fluids in the hopes of normalizing her creatinine in anticipation of discharge home tomorrow.  Active Problems:   Normocytic anemia - Likely anemia of chronic disease.    Hyperkalemia - Likely from dehydration/volume contraction. Resolved with IV fluids.    Essential hypertension - Antihypertensives currently on hold.    Hypothyroidism - Continue Synthroid. TSH slightly low at 0.139.    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 - CBGs now 130s-160s. Dextrose in IV fluids discontinued 12/06/15. - Continue Neurontin for neuropathy. - Hemoglobin A1c 6.4% on 03/01/15.    Hx pulmonary embolism - Continue Eliquis.    DVT Prophylaxis - Eliquis ordered.   Family Communication/Anticipated D/C date and plan/Code Status   Family Communication: Gene (son) updated by telephone 12/06/15. Disposition Plan: Home  12/08/15 with home health nursing services.  Lives alone. Anticipated D/C date:   Tomorrow if creatinine normalizes. Code Status: DO NOT RESUSCITATE   IV Access:    Peripheral IV   Procedures and diagnostic studies:   Dg Chest 2 View  12/04/2015  CLINICAL DATA:  Chest pain, generalize weakness, low back pain and foul-smelling urine EXAM: CHEST  2 VIEW COMPARISON:  02/28/2015 FINDINGS: The lungs are hyperinflated likely secondary to COPD. There is no focal parenchymal opacity. There is no pleural effusion or pneumothorax. The heart and mediastinal contours are unremarkable. The osseous structures are unremarkable. There is evidence of prior rotator cuff repair. IMPRESSION: No active cardiopulmonary disease. Electronically Signed   By: Kathreen Devoid   On: 12/04/2015 18:47     Medical Consultants:    None.  Anti-Infectives:   Anti-infectives    None      Subjective:   [name] continues to feel weak.  Denies dyspnea.  Appetite picking up some.  No N/V. Says she has not yet been out of bed.  Objective:    Filed Vitals:   12/06/15 0407 12/06/15 1259 12/06/15 2125 12/07/15 0525  BP: 171/53 126/74 140/50 147/57  Pulse: 51 57 55 57  Temp: 98.1 F (36.7 C) 97.4 F (36.3 C) 98.6 F (37 C) 97.9 F (36.6 C)  TempSrc: Oral Oral Oral Oral  Resp: 16 16 20 16   Height:      Weight: 51.2 kg (112 lb 14 oz)     SpO2: 98% 98% 97% 95%    Intake/Output Summary (Last 24 hours) at 12/07/15 0748 Last data filed at 12/06/15 1816  Gross per 24 hour  Intake    720 ml  Output      0 ml  Net    720 ml   Filed Weights   12/04/15 2048 12/05/15 0500 12/06/15 0407  Weight: 48.6 kg (107 lb 2.3 oz) 49.2 kg (108 lb 7.5 oz) 51.2 kg (112 lb 14 oz)    Exam: Gen:  NAD, more awake Cardiovascular:  RRR, No M/R/G Respiratory:  Lungs CTAB, diminished Gastrointestinal:  Abdomen soft, NT/ND, + BS Extremities:  No C/E/C   Data Reviewed:    Labs: Basic Metabolic Panel:  Recent  Labs Lab 12/04/15 1740 12/04/15 2300 12/05/15 0525 12/06/15 0558 12/07/15 0420  NA 140 143 142 144 143  K 5.7* 4.8 4.6 4.3 4.3  CL 106 110 108 110 113*  CO2 27 24 23 25 23   GLUCOSE 129* 87 78 115* 123*  BUN 45* 43* 37* 32* 31*  CREATININE 1.79* 1.60* 1.57* 1.49* 1.23*  CALCIUM 9.1 8.7* 9.0 8.4* 8.2*  PHOS  --   --   --  2.9  --    GFR Estimated Creatinine Clearance: 20.1 mL/min (by C-G formula based on Cr of 1.23). Liver Function Tests:  Recent Labs Lab 12/04/15 1740 12/05/15 0525 12/06/15 0558  AST 26 26  --   ALT 16 15  --   ALKPHOS 80 81  --   BILITOT 0.4 0.4  --   PROT 5.7* 6.0*  --   ALBUMIN 3.3* 3.3* 2.8*   CBC:  Recent Labs Lab 12/04/15 1740 12/05/15 0525  WBC 4.4 4.5  NEUTROABS 2.6 1.9  HGB 9.3* 9.8*  HCT 29.6* 31.0*  MCV 87.8 88.6  PLT 229 214   Cardiac Enzymes:  Recent Labs Lab 12/04/15 2300  CKTOTAL 55  TROPONINI <0.03   CBG:  Recent Labs Lab 12/05/15 1206 12/05/15 1705 12/05/15 2058 12/06/15 0746 12/06/15 2126  GLUCAP 68 131* 163* 104* 108*   Hgb A1c: No results for input(s): HGBA1C in the last 72 hours. Thyroid function studies:  Recent Labs  12/05/15 1208  TSH 0.139*   Sepsis Labs:  Recent Labs Lab 12/04/15 1740 12/04/15 1838 12/05/15 0525  WBC 4.4  --  4.5  LATICACIDVEN  --  0.75  --    Urinalysis    Component Value Date/Time   COLORURINE YELLOW 12/04/2015 1858   APPEARANCEUR CLEAR 12/04/2015 1858   LABSPEC 1.013 12/04/2015 1858   PHURINE 7.5 12/04/2015 1858   GLUCOSEU NEGATIVE 12/04/2015 1858   HGBUR NEGATIVE 12/04/2015 1858   BILIRUBINUR NEGATIVE 12/04/2015 1858   KETONESUR NEGATIVE 12/04/2015 1858   PROTEINUR 30* 12/04/2015 1858   UROBILINOGEN 0.2 02/28/2015 1542   NITRITE NEGATIVE 12/04/2015 1858   LEUKOCYTESUR NEGATIVE 12/04/2015 1858   Microbiology Recent Results (from the past 240 hour(s))  Urine culture     Status: None   Collection Time: 12/04/15  6:58 PM  Result Value Ref Range Status    Specimen Description URINE, CATHETERIZED  Final   Special Requests NONE  Final   Culture   Final    NO GROWTH 1 DAY Performed at [facility]    Report Status 12/06/2015 FINAL  Final     Medications:   . apixaban  2.5 mg Oral BID  . cyanocobalamin  1,000 mcg Intramuscular Q30 days  . diclofenac sodium  2 g Topical q morning - 10a  . dicyclomine  10 mg Oral TID AC  . feeding supplement (GLUCERNA SHAKE)  237 mL Oral TID PC & HS  . gabapentin  100 mg Oral BID  . l-methylfolate-B6-B12  1 tablet Oral Daily  . levothyroxine  75 mcg Oral QAC breakfast  . mirabegron ER  25 mg Oral Daily  . mirtazapine  15 mg Oral QHS  . pantoprazole  40 mg Oral Daily  . polyethylene glycol  17 g Oral Daily  . ropinirole  5 mg Oral QHS   Continuous Infusions: . sodium chloride 50 mL/hr at 12/06/15 0758    Time spent: 25 minutes.   LOS: 3 days   [facility] Pager [phone]. If unable to reach me by pager, please call my cell phone at [phone].  *Please refer to [email or web address], password [email or web address] to get updated schedule on who will round on this patient, as hospitalists switch teams weekly. If 7PM-7AM, please contact night-coverage at [email or web address], password [email or web address] for any overnight needs.  12/07/2015, 7:48 AM

## 2015-12-07 NOTE — Care Management Note (Signed)
Case Management Note  Patient Details  Name: [name] MRN: [id number] Date of Birth: May 15, 1919  Subjective/Objective:   Admitted with AKI                 Action/Plan: Discharge planning, spoke with patient at beside. Contacted [facility] for referral for [facility], they were not able to provide services at this time. Contacted [facility] per patietnt for [facility] services, they will follow patient and provide [facility] services. Patient lives at home alone but says she has a close network of support in her condo setting, she has someone who grocery shops for her and helps her with transportation needs. She has a life alert bracelet as well. Anticipating d/c in am, awaiting final orders.    Expected Discharge Date:                  Expected Discharge Plan:  [place]  In-House Referral:  Clinical Social Work  Discharge planning Services  CM Consult  Post Acute Care Choice:  Home Health Choice offered to:  Patient  DME Arranged:  N/A DME Agency:  NA  HH Arranged:  PT, OT, Nurse's Aide [place] Agency:  [place]  Status of Service:  Completed, signed off  Medicare Important Message Given:  Yes Date Medicare IM Given:    Medicare IM give by:    Date Additional Medicare IM Given:    Additional Medicare Important Message give by:     If discussed at [place] of Stay Meetings, dates discussed:    Additional Comments:  Guadalupe Maple, RN 12/07/2015, 4:02 PM

## 2015-12-07 NOTE — Care Management Important Message (Signed)
Important Message  Patient Details  Name: [name] MRN: [id number] Date of Birth: 03/08/1919   Medicare Important Message Given:  Yes    Camillo Flaming 12/07/2015, 11:23 AMImportant Message  Patient Details  Name: [name] MRN: [id number] Date of Birth: 1919/06/08   Medicare Important Message Given:  Yes    Camillo Flaming 12/07/2015, 11:22 AM

## 2015-12-07 NOTE — Progress Notes (Signed)
Physical Therapy Treatment Patient Details Name: GLEN JOW MRN: [id number] DOB: May 10, 1919 Today's Date: 12/07/2015    History of Present Illness DESERAE DURRETTE is a 81 y.o. female with history of hypertension, DVT on Apixaban, diabetes mellitus type 2, hypothyroidism presents to the ER 12/27 because of weakness and fatigue. Patient was recently treated with antibiotic course for UTI. Patient denies any chest pain or shortness of breath. Patient states she feels very weak and tired. In the ER labs reveal acute renal failure with mild hyperkalemia.     PT Comments    Very pleasant, oriented 81 year old lady. Assisted with amb and toileting all at Supervision level.  Good safety cognition and good safe gait.   Pt lives alone but has a strong support system of close neighbors Ship broker), family and wears a Oceanographer.     Follow Up Recommendations  Home health PT     Equipment Recommendations  None recommended by PT    Recommendations for Other Services       Precautions / Restrictions Precautions Precautions: Fall Restrictions Weight Bearing Restrictions: No    Mobility  Bed Mobility               General bed mobility comments: in recliner  Transfers Overall transfer level: Needs assistance Equipment used: Rolling walker (2 wheeled) Transfers: Sit to/from Stand Sit to Stand: Supervision         General transfer comment: good safety cognition and use of hands.  Pt able to transfer self rom recliner to [facility] and perform own toileting hygiene standing with no LOB.    Ambulation/Gait Ambulation/Gait assistance: Supervision Ambulation Distance (Feet): 200 Feet (100 feet x 2)   Gait Pattern/deviations: Step-through pattern Gait velocity: WFL   General Gait Details: good alternating gait with no LOB and no signs of fatigue for pt's age.     Stairs            Wheelchair Mobility    Modified Rankin (Stroke Patients Only)        Balance                                    Cognition Arousal/Alertness: Awake/alert Behavior During Therapy: WFL for tasks assessed/performed Overall Cognitive Status: Within Functional Limits for tasks assessed                      Exercises      General Comments        Pertinent Vitals/Pain Pain Assessment: No/denies pain    Home Living                      Prior Function            PT Goals (current goals can now be found in the care plan section) Progress towards PT goals: Progressing toward goals    Frequency  Min 3X/week    PT Plan      Co-evaluation             End of Session Equipment Utilized During Treatment: Gait belt Activity Tolerance: Patient tolerated treatment well Patient left: in chair;with call bell/phone within reach;with chair alarm set     Time: 1545-1610 PT Time Calculation (min) (ACUTE ONLY): 25 min  Charges:  $Gait Training: 8-22 mins $Therapeutic Activity: 8-22 mins  G Codes:      Rica Koyanagi  PTA [facility]  Acute  Rehab Pager      [phone]

## 2015-12-08 LAB — BASIC METABOLIC PANEL
ANION GAP: 6 (ref 5–15)
BUN: 25 mg/dL — ABNORMAL HIGH (ref 6–20)
CALCIUM: 8.1 mg/dL — AB (ref 8.9–10.3)
CO2: 22 mmol/L (ref 22–32)
Chloride: 115 mmol/L — ABNORMAL HIGH (ref 101–111)
Creatinine, Ser: 1.04 mg/dL — ABNORMAL HIGH (ref 0.44–1.00)
GFR calc non Af Amer: 45 mL/min — ABNORMAL LOW (ref >60)
GFR, EST AFRICAN AMERICAN: 52 mL/min — AB (ref >60)
Glucose, Bld: 101 mg/dL — ABNORMAL HIGH (ref 65–99)
POTASSIUM: 3.8 mmol/L (ref 3.5–5.1)
Sodium: 143 mmol/L (ref 135–145)

## 2015-12-08 LAB — GLUCOSE, CAPILLARY
GLUCOSE-CAPILLARY: 82 mg/dL (ref 65–99)
GLUCOSE-CAPILLARY: 78 mg/dL (ref 65–99)

## 2015-12-08 NOTE — Discharge Summary (Signed)
Physician Discharge Summary  [name] ALP:[id number] DOB: December 13, 1918 DOA: 12/04/2015  PCP: Benito Mccreedy, MD  Admit date: 12/04/2015 Discharge date: 12/08/2015   Recommendations for Outpatient Follow-Up:   1. Discharged home with home health nursing services.  Declined SNF due to inability to afford co-pay.   Discharge Diagnosis:   Principal Problem:    AKI (acute kidney injury) ([place]) Active Problems:    Essential hypertension    Polymyalgia rheumatica (HCC)    Anemia of chronic disease    Hypothyroidism    Type 2 diabetes mellitus ([facility])    Hx pulmonary embolism  Discharge disposition:  Home.    Discharge Condition: Improved.  Diet recommendation: Carbohydrate-modified.    History of Present Illness:   [name] is an 81 y.o. female with a PMH of hypertension, DVT on Apixaban, diabetes mellitus type 2, hypothyroidism who was admitted 12/04/15 with a chief complaint of weakness and fatigue in the setting of recent antibiotic use for treatment of a UTI. Upon initial evaluation in the ED, the patient was found to have acute kidney injury with mild hyperkalemia.  Hospital Course by Problem:   Principal Problem:  AKI (acute kidney injury) ([place]) with weakness and fatigue - Resolved with gentle IV fluids and holding nephrotoxins including ARB and lasix.  - Baseline creatinine 0.8-0.9. Creatinine now back to usual baseline values. - Given history of PMR, CK and ESR were checked. CK WNL. ESR 38. - Physical therapy evaluation done 12/05/15, SNF versus 24 hour supervision recommended.  Active Problems:  Normocytic anemia - Likely anemia of chronic disease.   Hyperkalemia - Likely from dehydration/volume contraction. Resolved with IV fluids.   Essential hypertension - Antihypertensives currently on hold. OK to resume at D/C.  Stop Lasix.   Hypothyroidism - Continue Synthroid. TSH slightly low at 0.139.   Type 2 diabetes mellitus (HCC)  with neuropathy/hypoglycemia - Continue Neurontin for neuropathy. - Hemoglobin A1c 6.4% on 03/01/15. - Diet controlled.   Hx pulmonary embolism - Continue Eliquis.   Medical Consultants:    None.   Discharge Exam:   Filed Vitals:   12/07/15 2117 12/08/15 0518  BP: 148/53 146/52  Pulse: 61 56  Temp: 98.2 F (36.8 C) 98.4 F (36.9 C)  Resp: 17 20   Filed Vitals:   12/07/15 0525 12/07/15 1318 12/07/15 2117 12/08/15 0518  BP: 147/57 130/61 148/53 146/52  Pulse: 57 58 61 56  Temp: 97.9 F (36.6 C) 97.5 F (36.4 C) 98.2 F (36.8 C) 98.4 F (36.9 C)  TempSrc: Oral Oral Oral Oral  Resp: 16 18 17 20   Height:      Weight:      SpO2: 95% 96% 99% 96%    Gen:  NAD, sleepy Cardiovascular:  RRR, No M/R/G Respiratory: Lungs CTAB Gastrointestinal: Abdomen soft, NT/ND with normal active bowel sounds. Extremities: No C/E/C   The results of significant diagnostics from this hospitalization (including imaging, microbiology, ancillary and laboratory) are listed below for reference.     Procedures and Diagnostic Studies:   Dg Chest 2 View  12/04/2015  CLINICAL DATA:  Chest pain, generalize weakness, low back pain and foul-smelling urine EXAM: CHEST  2 VIEW COMPARISON:  02/28/2015 FINDINGS: The lungs are hyperinflated likely secondary to COPD. There is no focal parenchymal opacity. There is no pleural effusion or pneumothorax. The heart and mediastinal contours are unremarkable. The osseous structures are unremarkable. There is evidence of prior rotator cuff repair. IMPRESSION: No active cardiopulmonary disease. Electronically Signed   By:  Kathreen Devoid   On: 12/04/2015 18:47     Labs:   Basic Metabolic Panel:  Recent Labs Lab 12/04/15 2300 12/05/15 0525 12/06/15 0558 12/07/15 0420 12/08/15 0443  NA 143 142 144 143 143  K 4.8 4.6 4.3 4.3 3.8  CL 110 108 110 113* 115*  CO2 24 23 25 23 22   GLUCOSE 87 78 115* 123* 101*  BUN 43* 37* 32* 31* 25*  CREATININE 1.60* 1.57*  1.49* 1.23* 1.04*  CALCIUM 8.7* 9.0 8.4* 8.2* 8.1*  PHOS  --   --  2.9  --   --    GFR Estimated Creatinine Clearance: 23.8 mL/min (by C-G formula based on Cr of 1.04). Liver Function Tests:  Recent Labs Lab 12/04/15 1740 12/05/15 0525 12/06/15 0558  AST 26 26  --   ALT 16 15  --   ALKPHOS 80 81  --   BILITOT 0.4 0.4  --   PROT 5.7* 6.0*  --   ALBUMIN 3.3* 3.3* 2.8*   CBC:  Recent Labs Lab 12/04/15 1740 12/05/15 0525  WBC 4.4 4.5  NEUTROABS 2.6 1.9  HGB 9.3* 9.8*  HCT 29.6* 31.0*  MCV 87.8 88.6  PLT 229 214   Cardiac Enzymes:  Recent Labs Lab 12/04/15 2300  CKTOTAL 41  TROPONINI <0.03   CBG:  Recent Labs Lab 12/07/15 0747 12/07/15 1143 12/07/15 1656 12/08/15 0811 12/08/15 1150  GLUCAP 86 97 141* 82 78   Microbiology Recent Results (from the past 240 hour(s))  Urine culture     Status: None   Collection Time: 12/04/15  6:58 PM  Result Value Ref Range Status   Specimen Description URINE, CATHETERIZED  Final   Special Requests NONE  Final   Culture   Final    NO GROWTH 1 DAY Performed at [facility]    Report Status 12/06/2015 FINAL  Final     Discharge Instructions:   Discharge Instructions    Call MD for:  extreme fatigue    Complete by:  As directed      Call MD for:  persistant dizziness or light-headedness    Complete by:  As directed      Call MD for:  persistant nausea and vomiting    Complete by:  As directed      Call MD for:  severe uncontrolled pain    Complete by:  As directed      Call MD for:  temperature >100.4    Complete by:  As directed      Diet - low sodium heart healthy    Complete by:  As directed      Face-to-face encounter (required for Medicare/Medicaid patients)    Complete by:  As directed   I RAMA,CHRISTINA certify that this patient is under my care and that I, or a nurse practitioner or physician's assistant working with me, had a face-to-face encounter that meets the physician face-to-face encounter  requirements with this patient on 12/08/2015. The encounter with the patient was in whole, or in part for the following medical condition(s) which is the primary reason for home health care (List medical condition): Admitted with acute renal failure, weakness, deconditioning. Needs PT/OT and nurses aide to assist with ADLs. RN for home safety evaluation.  The encounter with the patient was in whole, or in part, for the following medical condition, which is the primary reason for home health care:  Deconditioning post hospitalization for ARF  I certify that, based on my findings,  the following services are medically necessary home health services:   Physical therapy Nursing    Reason for Medically Necessary Home Health Services:   Therapy- Home Adaptation to Facilitate Safety Therapy- Therapeutic Exercises to Increase Strength and Endurance Skilled Nursing- Skilled Assessment/Observation    My clinical findings support the need for the above services:  Unable to leave home safely without assistance and/or assistive device  Further, I certify that my clinical findings support that this patient is homebound due to:  Unable to leave home safely without assistance     Home Health    Complete by:  As directed   To provide the following care/treatments:   PT OT RN Home Health Aide       Increase activity slowly    Complete by:  As directed             Medication List    STOP taking these medications        dicyclomine 10 MG capsule  Commonly known as:  BENTYL     furosemide 20 MG tablet  Commonly known as:  LASIX     phenazopyridine 200 MG tablet  Commonly known as:  PYRIDIUM      TAKE these medications        apixaban 2.5 MG Tabs tablet  Commonly known as:  ELIQUIS  Take 1 tablet (2.5 mg total) by mouth 2 (two) times daily.     CLEVER CHEK AUTO-CODE VOICE Devi  USE TO TEST BLOOD GLUCOSE     cyanocobalamin 1000 MCG/ML injection  Commonly known as:  (VITAMIN B-12)  Inject  1,000 mcg into the muscle every 30 (thirty) days.     diclofenac sodium 1 % Gel  Commonly known as:  VOLTAREN  Apply 2 g topically every morning.     feeding supplement (GLUCERNA SHAKE) Liqd  Take 237 mLs by mouth 4 (four) times daily - after meals and at bedtime.     gabapentin 100 MG capsule  Commonly known as:  NEURONTIN  Take 100 mg by mouth 2 (two) times daily.     hyoscyamine 0.125 MG SL tablet  Commonly known as:  LEVSIN SL  Place 1 tablet (0.125 mg total) under the tongue every 6 (six) hours as needed for cramping.     l-methylfolate-B6-B12 3-35-2 MG Tabs tablet  Commonly known as:  METANX  Take 1 tablet by mouth daily.     levothyroxine 75 MCG tablet  Commonly known as:  SYNTHROID, LEVOTHROID  Take 75 mcg by mouth daily before breakfast.     LITE TOUCH LANCETS Misc  daily.     losartan 50 MG tablet  Commonly known as:  COZAAR  Take 50 mg by mouth daily.     metFORMIN 500 MG tablet  Commonly known as:  GLUCOPHAGE  Take 1 tablet (500 mg total) by mouth 2 (two) times daily with a meal.     mirabegron ER 25 MG Tb24 tablet  Commonly known as:  MYRBETRIQ  Take 25 mg by mouth daily.     mirtazapine 15 MG tablet  Commonly known as:  REMERON  Take 15 mg by mouth at bedtime.     ondansetron 4 MG tablet  Commonly known as:  ZOFRAN  Take 1 tablet (4 mg total) by mouth every 8 (eight) hours as needed for nausea or vomiting.     oxyCODONE 5 MG immediate release tablet  Commonly known as:  Oxy IR/ROXICODONE  Take 2 tabs= 10 mg by mouth  every 6 hours as needed for nausea.     pantoprazole 40 MG tablet  Commonly known as:  PROTONIX  Take 40 mg by mouth daily.     PHARMACIST CHOICE NO CODING test strip  Generic drug:  glucose blood  1 each by Other route as needed.     polyethylene glycol packet  Commonly known as:  MIRALAX / GLYCOLAX  Take 17 g by mouth daily.     ropinirole 5 MG tablet  Commonly known as:  REQUIP  Take 5 mg by mouth at bedtime.     TAI DOC  CONTROL Normal Soln  USE TO PERIODICALLY CHECK GLUCOSE MONITOR ACCORDING TO THE MANUAL           Follow-up Information    Follow up with OSEI-BONSU,GEORGE, MD. Schedule an appointment as soon as possible for a visit in 1 week.   Specialty:  Internal Medicine   Why:  Hospital follow up.   Contact information:   [place] [address] [phone]       Follow up with [facility].   Why:  Home Health RN, Physical Therapy, Occupational Therapy and aide   Contact information:   [address] [place] [address] [phone]        Time coordinating discharge: 35 minutes.  Signed:  RAMA,CHRISTINA  Pager [phone] [facility] 12/08/2015, 1:37 PM

## 2015-12-08 NOTE — Discharge Instructions (Signed)

## 2015-12-08 NOTE — Progress Notes (Signed)
Pt states she has RW, cane and bedside commode at home. Notified [facility] of scheduled dc home today with HH. Jonnie Finner RN CCM Case Mgmt phone [phone]

## 2015-12-08 NOTE — Progress Notes (Signed)
Pt discharged home with neighbor in stable condition.  Discharge instructions given. Pt verbalized understanding

## 2015-12-09 ENCOUNTER — Other Ambulatory Visit: Payer: Self-pay | Admitting: Physician Assistant

## 2015-12-09 DIAGNOSIS — E875 Hyperkalemia: Secondary | ICD-10-CM | POA: Diagnosis not present

## 2015-12-09 DIAGNOSIS — E0580 Other thyrotoxicosis without thyrotoxic crisis or storm: Secondary | ICD-10-CM | POA: Diagnosis not present

## 2015-12-09 DIAGNOSIS — I4891 Unspecified atrial fibrillation: Secondary | ICD-10-CM | POA: Diagnosis not present

## 2015-12-09 DIAGNOSIS — I10 Essential (primary) hypertension: Secondary | ICD-10-CM | POA: Diagnosis not present

## 2015-12-09 DIAGNOSIS — Z87440 Personal history of urinary (tract) infections: Secondary | ICD-10-CM | POA: Diagnosis not present

## 2015-12-09 DIAGNOSIS — D649 Anemia, unspecified: Secondary | ICD-10-CM | POA: Diagnosis not present

## 2015-12-09 DIAGNOSIS — E1165 Type 2 diabetes mellitus with hyperglycemia: Secondary | ICD-10-CM | POA: Diagnosis not present

## 2015-12-09 DIAGNOSIS — H9190 Unspecified hearing loss, unspecified ear: Secondary | ICD-10-CM | POA: Diagnosis not present

## 2015-12-09 DIAGNOSIS — E1142 Type 2 diabetes mellitus with diabetic polyneuropathy: Secondary | ICD-10-CM | POA: Diagnosis not present

## 2015-12-11 DIAGNOSIS — Z87440 Personal history of urinary (tract) infections: Secondary | ICD-10-CM | POA: Diagnosis not present

## 2015-12-11 DIAGNOSIS — D649 Anemia, unspecified: Secondary | ICD-10-CM | POA: Diagnosis not present

## 2015-12-11 DIAGNOSIS — I4891 Unspecified atrial fibrillation: Secondary | ICD-10-CM | POA: Diagnosis not present

## 2015-12-11 DIAGNOSIS — I10 Essential (primary) hypertension: Secondary | ICD-10-CM | POA: Diagnosis not present

## 2015-12-11 DIAGNOSIS — E1165 Type 2 diabetes mellitus with hyperglycemia: Secondary | ICD-10-CM | POA: Diagnosis not present

## 2015-12-11 DIAGNOSIS — E1142 Type 2 diabetes mellitus with diabetic polyneuropathy: Secondary | ICD-10-CM | POA: Diagnosis not present

## 2015-12-12 DIAGNOSIS — I4891 Unspecified atrial fibrillation: Secondary | ICD-10-CM | POA: Diagnosis not present

## 2015-12-12 DIAGNOSIS — E1142 Type 2 diabetes mellitus with diabetic polyneuropathy: Secondary | ICD-10-CM | POA: Diagnosis not present

## 2015-12-12 DIAGNOSIS — E1165 Type 2 diabetes mellitus with hyperglycemia: Secondary | ICD-10-CM | POA: Diagnosis not present

## 2015-12-12 DIAGNOSIS — Z87440 Personal history of urinary (tract) infections: Secondary | ICD-10-CM | POA: Diagnosis not present

## 2015-12-12 DIAGNOSIS — I10 Essential (primary) hypertension: Secondary | ICD-10-CM | POA: Diagnosis not present

## 2015-12-12 DIAGNOSIS — D649 Anemia, unspecified: Secondary | ICD-10-CM | POA: Diagnosis not present

## 2015-12-14 DIAGNOSIS — I10 Essential (primary) hypertension: Secondary | ICD-10-CM | POA: Diagnosis not present

## 2015-12-14 DIAGNOSIS — E1142 Type 2 diabetes mellitus with diabetic polyneuropathy: Secondary | ICD-10-CM | POA: Diagnosis not present

## 2015-12-14 DIAGNOSIS — Z87440 Personal history of urinary (tract) infections: Secondary | ICD-10-CM | POA: Diagnosis not present

## 2015-12-14 DIAGNOSIS — D649 Anemia, unspecified: Secondary | ICD-10-CM | POA: Diagnosis not present

## 2015-12-14 DIAGNOSIS — I4891 Unspecified atrial fibrillation: Secondary | ICD-10-CM | POA: Diagnosis not present

## 2015-12-14 DIAGNOSIS — E1165 Type 2 diabetes mellitus with hyperglycemia: Secondary | ICD-10-CM | POA: Diagnosis not present

## 2015-12-17 DIAGNOSIS — Z87440 Personal history of urinary (tract) infections: Secondary | ICD-10-CM | POA: Diagnosis not present

## 2015-12-17 DIAGNOSIS — I10 Essential (primary) hypertension: Secondary | ICD-10-CM | POA: Diagnosis not present

## 2015-12-17 DIAGNOSIS — E1165 Type 2 diabetes mellitus with hyperglycemia: Secondary | ICD-10-CM | POA: Diagnosis not present

## 2015-12-17 DIAGNOSIS — D649 Anemia, unspecified: Secondary | ICD-10-CM | POA: Diagnosis not present

## 2015-12-17 DIAGNOSIS — E1142 Type 2 diabetes mellitus with diabetic polyneuropathy: Secondary | ICD-10-CM | POA: Diagnosis not present

## 2015-12-17 DIAGNOSIS — I4891 Unspecified atrial fibrillation: Secondary | ICD-10-CM | POA: Diagnosis not present

## 2015-12-18 DIAGNOSIS — D649 Anemia, unspecified: Secondary | ICD-10-CM | POA: Diagnosis not present

## 2015-12-18 DIAGNOSIS — E1165 Type 2 diabetes mellitus with hyperglycemia: Secondary | ICD-10-CM | POA: Diagnosis not present

## 2015-12-18 DIAGNOSIS — E1142 Type 2 diabetes mellitus with diabetic polyneuropathy: Secondary | ICD-10-CM | POA: Diagnosis not present

## 2015-12-18 DIAGNOSIS — I4891 Unspecified atrial fibrillation: Secondary | ICD-10-CM | POA: Diagnosis not present

## 2015-12-18 DIAGNOSIS — Z87440 Personal history of urinary (tract) infections: Secondary | ICD-10-CM | POA: Diagnosis not present

## 2015-12-18 DIAGNOSIS — I10 Essential (primary) hypertension: Secondary | ICD-10-CM | POA: Diagnosis not present

## 2015-12-20 DIAGNOSIS — E538 Deficiency of other specified B group vitamins: Secondary | ICD-10-CM | POA: Diagnosis not present

## 2015-12-20 DIAGNOSIS — Z87440 Personal history of urinary (tract) infections: Secondary | ICD-10-CM | POA: Diagnosis not present

## 2015-12-20 DIAGNOSIS — G8929 Other chronic pain: Secondary | ICD-10-CM | POA: Diagnosis not present

## 2015-12-20 DIAGNOSIS — N183 Chronic kidney disease, stage 3 (moderate): Secondary | ICD-10-CM | POA: Diagnosis not present

## 2015-12-20 DIAGNOSIS — E039 Hypothyroidism, unspecified: Secondary | ICD-10-CM | POA: Diagnosis not present

## 2015-12-20 DIAGNOSIS — E1140 Type 2 diabetes mellitus with diabetic neuropathy, unspecified: Secondary | ICD-10-CM | POA: Diagnosis not present

## 2015-12-20 DIAGNOSIS — I10 Essential (primary) hypertension: Secondary | ICD-10-CM | POA: Diagnosis not present

## 2015-12-20 DIAGNOSIS — E1165 Type 2 diabetes mellitus with hyperglycemia: Secondary | ICD-10-CM | POA: Diagnosis not present

## 2015-12-20 DIAGNOSIS — K219 Gastro-esophageal reflux disease without esophagitis: Secondary | ICD-10-CM | POA: Diagnosis not present

## 2015-12-20 DIAGNOSIS — F329 Major depressive disorder, single episode, unspecified: Secondary | ICD-10-CM | POA: Diagnosis not present

## 2015-12-20 DIAGNOSIS — G629 Polyneuropathy, unspecified: Secondary | ICD-10-CM | POA: Diagnosis not present

## 2015-12-20 DIAGNOSIS — D649 Anemia, unspecified: Secondary | ICD-10-CM | POA: Diagnosis not present

## 2015-12-20 DIAGNOSIS — Z0000 Encounter for general adult medical examination without abnormal findings: Secondary | ICD-10-CM | POA: Diagnosis not present

## 2015-12-20 DIAGNOSIS — E1142 Type 2 diabetes mellitus with diabetic polyneuropathy: Secondary | ICD-10-CM | POA: Diagnosis not present

## 2015-12-20 DIAGNOSIS — I4891 Unspecified atrial fibrillation: Secondary | ICD-10-CM | POA: Diagnosis not present

## 2015-12-20 DIAGNOSIS — G2581 Restless legs syndrome: Secondary | ICD-10-CM | POA: Diagnosis not present

## 2015-12-21 DIAGNOSIS — I4891 Unspecified atrial fibrillation: Secondary | ICD-10-CM | POA: Diagnosis not present

## 2015-12-21 DIAGNOSIS — D649 Anemia, unspecified: Secondary | ICD-10-CM | POA: Diagnosis not present

## 2015-12-21 DIAGNOSIS — E1142 Type 2 diabetes mellitus with diabetic polyneuropathy: Secondary | ICD-10-CM | POA: Diagnosis not present

## 2015-12-21 DIAGNOSIS — Z87440 Personal history of urinary (tract) infections: Secondary | ICD-10-CM | POA: Diagnosis not present

## 2015-12-21 DIAGNOSIS — E1165 Type 2 diabetes mellitus with hyperglycemia: Secondary | ICD-10-CM | POA: Diagnosis not present

## 2015-12-21 DIAGNOSIS — I10 Essential (primary) hypertension: Secondary | ICD-10-CM | POA: Diagnosis not present

## 2015-12-24 DIAGNOSIS — Z87440 Personal history of urinary (tract) infections: Secondary | ICD-10-CM | POA: Diagnosis not present

## 2015-12-24 DIAGNOSIS — I4891 Unspecified atrial fibrillation: Secondary | ICD-10-CM | POA: Diagnosis not present

## 2015-12-24 DIAGNOSIS — D649 Anemia, unspecified: Secondary | ICD-10-CM | POA: Diagnosis not present

## 2015-12-24 DIAGNOSIS — E1165 Type 2 diabetes mellitus with hyperglycemia: Secondary | ICD-10-CM | POA: Diagnosis not present

## 2015-12-24 DIAGNOSIS — E1142 Type 2 diabetes mellitus with diabetic polyneuropathy: Secondary | ICD-10-CM | POA: Diagnosis not present

## 2015-12-24 DIAGNOSIS — I10 Essential (primary) hypertension: Secondary | ICD-10-CM | POA: Diagnosis not present

## 2015-12-25 DIAGNOSIS — G2581 Restless legs syndrome: Secondary | ICD-10-CM | POA: Diagnosis not present

## 2015-12-25 DIAGNOSIS — I4891 Unspecified atrial fibrillation: Secondary | ICD-10-CM | POA: Diagnosis not present

## 2015-12-25 DIAGNOSIS — Z87440 Personal history of urinary (tract) infections: Secondary | ICD-10-CM | POA: Diagnosis not present

## 2015-12-25 DIAGNOSIS — G8929 Other chronic pain: Secondary | ICD-10-CM | POA: Diagnosis not present

## 2015-12-25 DIAGNOSIS — I10 Essential (primary) hypertension: Secondary | ICD-10-CM | POA: Diagnosis not present

## 2015-12-25 DIAGNOSIS — N183 Chronic kidney disease, stage 3 (moderate): Secondary | ICD-10-CM | POA: Diagnosis not present

## 2015-12-25 DIAGNOSIS — D649 Anemia, unspecified: Secondary | ICD-10-CM | POA: Diagnosis not present

## 2015-12-25 DIAGNOSIS — E1140 Type 2 diabetes mellitus with diabetic neuropathy, unspecified: Secondary | ICD-10-CM | POA: Diagnosis not present

## 2015-12-25 DIAGNOSIS — E1165 Type 2 diabetes mellitus with hyperglycemia: Secondary | ICD-10-CM | POA: Diagnosis not present

## 2015-12-25 DIAGNOSIS — K219 Gastro-esophageal reflux disease without esophagitis: Secondary | ICD-10-CM | POA: Diagnosis not present

## 2015-12-25 DIAGNOSIS — E039 Hypothyroidism, unspecified: Secondary | ICD-10-CM | POA: Diagnosis not present

## 2015-12-25 DIAGNOSIS — E538 Deficiency of other specified B group vitamins: Secondary | ICD-10-CM | POA: Diagnosis not present

## 2015-12-25 DIAGNOSIS — G629 Polyneuropathy, unspecified: Secondary | ICD-10-CM | POA: Diagnosis not present

## 2015-12-25 DIAGNOSIS — F329 Major depressive disorder, single episode, unspecified: Secondary | ICD-10-CM | POA: Diagnosis not present

## 2015-12-25 DIAGNOSIS — E1142 Type 2 diabetes mellitus with diabetic polyneuropathy: Secondary | ICD-10-CM | POA: Diagnosis not present

## 2015-12-26 DIAGNOSIS — E1142 Type 2 diabetes mellitus with diabetic polyneuropathy: Secondary | ICD-10-CM | POA: Diagnosis not present

## 2015-12-26 DIAGNOSIS — I4891 Unspecified atrial fibrillation: Secondary | ICD-10-CM | POA: Diagnosis not present

## 2015-12-26 DIAGNOSIS — D649 Anemia, unspecified: Secondary | ICD-10-CM | POA: Diagnosis not present

## 2015-12-26 DIAGNOSIS — I10 Essential (primary) hypertension: Secondary | ICD-10-CM | POA: Diagnosis not present

## 2015-12-26 DIAGNOSIS — Z87440 Personal history of urinary (tract) infections: Secondary | ICD-10-CM | POA: Diagnosis not present

## 2015-12-26 DIAGNOSIS — E1165 Type 2 diabetes mellitus with hyperglycemia: Secondary | ICD-10-CM | POA: Diagnosis not present

## 2015-12-27 DIAGNOSIS — I4891 Unspecified atrial fibrillation: Secondary | ICD-10-CM | POA: Diagnosis not present

## 2015-12-27 DIAGNOSIS — E1165 Type 2 diabetes mellitus with hyperglycemia: Secondary | ICD-10-CM | POA: Diagnosis not present

## 2015-12-27 DIAGNOSIS — I10 Essential (primary) hypertension: Secondary | ICD-10-CM | POA: Diagnosis not present

## 2015-12-27 DIAGNOSIS — D649 Anemia, unspecified: Secondary | ICD-10-CM | POA: Diagnosis not present

## 2015-12-27 DIAGNOSIS — E1142 Type 2 diabetes mellitus with diabetic polyneuropathy: Secondary | ICD-10-CM | POA: Diagnosis not present

## 2015-12-27 DIAGNOSIS — Z87440 Personal history of urinary (tract) infections: Secondary | ICD-10-CM | POA: Diagnosis not present

## 2015-12-31 DIAGNOSIS — E1165 Type 2 diabetes mellitus with hyperglycemia: Secondary | ICD-10-CM | POA: Diagnosis not present

## 2015-12-31 DIAGNOSIS — Z87440 Personal history of urinary (tract) infections: Secondary | ICD-10-CM | POA: Diagnosis not present

## 2015-12-31 DIAGNOSIS — E1142 Type 2 diabetes mellitus with diabetic polyneuropathy: Secondary | ICD-10-CM | POA: Diagnosis not present

## 2015-12-31 DIAGNOSIS — I4891 Unspecified atrial fibrillation: Secondary | ICD-10-CM | POA: Diagnosis not present

## 2015-12-31 DIAGNOSIS — D649 Anemia, unspecified: Secondary | ICD-10-CM | POA: Diagnosis not present

## 2015-12-31 DIAGNOSIS — I10 Essential (primary) hypertension: Secondary | ICD-10-CM | POA: Diagnosis not present

## 2016-01-01 DIAGNOSIS — D649 Anemia, unspecified: Secondary | ICD-10-CM | POA: Diagnosis not present

## 2016-01-01 DIAGNOSIS — E1142 Type 2 diabetes mellitus with diabetic polyneuropathy: Secondary | ICD-10-CM | POA: Diagnosis not present

## 2016-01-01 DIAGNOSIS — Z87440 Personal history of urinary (tract) infections: Secondary | ICD-10-CM | POA: Diagnosis not present

## 2016-01-01 DIAGNOSIS — I10 Essential (primary) hypertension: Secondary | ICD-10-CM | POA: Diagnosis not present

## 2016-01-01 DIAGNOSIS — I4891 Unspecified atrial fibrillation: Secondary | ICD-10-CM | POA: Diagnosis not present

## 2016-01-01 DIAGNOSIS — E1165 Type 2 diabetes mellitus with hyperglycemia: Secondary | ICD-10-CM | POA: Diagnosis not present

## 2016-01-04 DIAGNOSIS — E1142 Type 2 diabetes mellitus with diabetic polyneuropathy: Secondary | ICD-10-CM | POA: Diagnosis not present

## 2016-01-04 DIAGNOSIS — D649 Anemia, unspecified: Secondary | ICD-10-CM | POA: Diagnosis not present

## 2016-01-04 DIAGNOSIS — E1165 Type 2 diabetes mellitus with hyperglycemia: Secondary | ICD-10-CM | POA: Diagnosis not present

## 2016-01-04 DIAGNOSIS — I4891 Unspecified atrial fibrillation: Secondary | ICD-10-CM | POA: Diagnosis not present

## 2016-01-04 DIAGNOSIS — I10 Essential (primary) hypertension: Secondary | ICD-10-CM | POA: Diagnosis not present

## 2016-01-04 DIAGNOSIS — Z87440 Personal history of urinary (tract) infections: Secondary | ICD-10-CM | POA: Diagnosis not present

## 2016-01-10 DIAGNOSIS — E538 Deficiency of other specified B group vitamins: Secondary | ICD-10-CM | POA: Diagnosis not present

## 2016-01-10 DIAGNOSIS — N183 Chronic kidney disease, stage 3 (moderate): Secondary | ICD-10-CM | POA: Diagnosis not present

## 2016-01-10 DIAGNOSIS — G8929 Other chronic pain: Secondary | ICD-10-CM | POA: Diagnosis not present

## 2016-01-10 DIAGNOSIS — E1140 Type 2 diabetes mellitus with diabetic neuropathy, unspecified: Secondary | ICD-10-CM | POA: Diagnosis not present

## 2016-01-10 DIAGNOSIS — K219 Gastro-esophageal reflux disease without esophagitis: Secondary | ICD-10-CM | POA: Diagnosis not present

## 2016-01-10 DIAGNOSIS — F329 Major depressive disorder, single episode, unspecified: Secondary | ICD-10-CM | POA: Diagnosis not present

## 2016-01-10 DIAGNOSIS — I10 Essential (primary) hypertension: Secondary | ICD-10-CM | POA: Diagnosis not present

## 2016-01-10 DIAGNOSIS — G629 Polyneuropathy, unspecified: Secondary | ICD-10-CM | POA: Diagnosis not present

## 2016-01-10 DIAGNOSIS — D649 Anemia, unspecified: Secondary | ICD-10-CM | POA: Diagnosis not present

## 2016-01-10 DIAGNOSIS — E039 Hypothyroidism, unspecified: Secondary | ICD-10-CM | POA: Diagnosis not present

## 2016-01-10 DIAGNOSIS — G2581 Restless legs syndrome: Secondary | ICD-10-CM | POA: Diagnosis not present

## 2016-01-18 DIAGNOSIS — E1142 Type 2 diabetes mellitus with diabetic polyneuropathy: Secondary | ICD-10-CM | POA: Diagnosis not present

## 2016-01-18 DIAGNOSIS — I4891 Unspecified atrial fibrillation: Secondary | ICD-10-CM | POA: Diagnosis not present

## 2016-01-18 DIAGNOSIS — I10 Essential (primary) hypertension: Secondary | ICD-10-CM | POA: Diagnosis not present

## 2016-01-18 DIAGNOSIS — D649 Anemia, unspecified: Secondary | ICD-10-CM | POA: Diagnosis not present

## 2016-01-18 DIAGNOSIS — Z87440 Personal history of urinary (tract) infections: Secondary | ICD-10-CM | POA: Diagnosis not present

## 2016-01-18 DIAGNOSIS — E1165 Type 2 diabetes mellitus with hyperglycemia: Secondary | ICD-10-CM | POA: Diagnosis not present

## 2016-02-05 DIAGNOSIS — D472 Monoclonal gammopathy: Secondary | ICD-10-CM | POA: Diagnosis not present

## 2016-02-05 DIAGNOSIS — D649 Anemia, unspecified: Secondary | ICD-10-CM | POA: Diagnosis not present

## 2016-02-05 DIAGNOSIS — N182 Chronic kidney disease, stage 2 (mild): Secondary | ICD-10-CM | POA: Diagnosis not present

## 2016-02-05 DIAGNOSIS — K219 Gastro-esophageal reflux disease without esophagitis: Secondary | ICD-10-CM | POA: Diagnosis not present

## 2016-02-05 DIAGNOSIS — I10 Essential (primary) hypertension: Secondary | ICD-10-CM | POA: Diagnosis not present

## 2016-02-05 DIAGNOSIS — G2581 Restless legs syndrome: Secondary | ICD-10-CM | POA: Diagnosis not present

## 2016-02-05 DIAGNOSIS — E039 Hypothyroidism, unspecified: Secondary | ICD-10-CM | POA: Diagnosis not present

## 2016-02-05 DIAGNOSIS — E1140 Type 2 diabetes mellitus with diabetic neuropathy, unspecified: Secondary | ICD-10-CM | POA: Diagnosis not present

## 2016-02-05 DIAGNOSIS — D631 Anemia in chronic kidney disease: Secondary | ICD-10-CM | POA: Diagnosis not present

## 2016-02-05 DIAGNOSIS — F329 Major depressive disorder, single episode, unspecified: Secondary | ICD-10-CM | POA: Diagnosis not present

## 2016-02-05 DIAGNOSIS — I129 Hypertensive chronic kidney disease with stage 1 through stage 4 chronic kidney disease, or unspecified chronic kidney disease: Secondary | ICD-10-CM | POA: Diagnosis not present

## 2016-02-05 DIAGNOSIS — G8929 Other chronic pain: Secondary | ICD-10-CM | POA: Diagnosis not present

## 2016-02-05 DIAGNOSIS — R002 Palpitations: Secondary | ICD-10-CM | POA: Diagnosis not present

## 2016-02-05 DIAGNOSIS — E538 Deficiency of other specified B group vitamins: Secondary | ICD-10-CM | POA: Diagnosis not present

## 2016-02-05 DIAGNOSIS — G629 Polyneuropathy, unspecified: Secondary | ICD-10-CM | POA: Diagnosis not present

## 2016-02-05 DIAGNOSIS — N183 Chronic kidney disease, stage 3 (moderate): Secondary | ICD-10-CM | POA: Diagnosis not present

## 2016-02-06 DIAGNOSIS — D649 Anemia, unspecified: Secondary | ICD-10-CM | POA: Diagnosis not present

## 2016-02-06 DIAGNOSIS — E1165 Type 2 diabetes mellitus with hyperglycemia: Secondary | ICD-10-CM | POA: Diagnosis not present

## 2016-02-06 DIAGNOSIS — I10 Essential (primary) hypertension: Secondary | ICD-10-CM | POA: Diagnosis not present

## 2016-02-06 DIAGNOSIS — Z87440 Personal history of urinary (tract) infections: Secondary | ICD-10-CM | POA: Diagnosis not present

## 2016-02-06 DIAGNOSIS — E1142 Type 2 diabetes mellitus with diabetic polyneuropathy: Secondary | ICD-10-CM | POA: Diagnosis not present

## 2016-02-06 DIAGNOSIS — I4891 Unspecified atrial fibrillation: Secondary | ICD-10-CM | POA: Diagnosis not present

## 2016-02-07 DIAGNOSIS — H9190 Unspecified hearing loss, unspecified ear: Secondary | ICD-10-CM | POA: Diagnosis not present

## 2016-02-07 DIAGNOSIS — E1142 Type 2 diabetes mellitus with diabetic polyneuropathy: Secondary | ICD-10-CM | POA: Diagnosis not present

## 2016-02-07 DIAGNOSIS — E875 Hyperkalemia: Secondary | ICD-10-CM | POA: Diagnosis not present

## 2016-02-07 DIAGNOSIS — I10 Essential (primary) hypertension: Secondary | ICD-10-CM | POA: Diagnosis not present

## 2016-02-07 DIAGNOSIS — Z87440 Personal history of urinary (tract) infections: Secondary | ICD-10-CM | POA: Diagnosis not present

## 2016-02-07 DIAGNOSIS — E0580 Other thyrotoxicosis without thyrotoxic crisis or storm: Secondary | ICD-10-CM | POA: Diagnosis not present

## 2016-02-07 DIAGNOSIS — E1165 Type 2 diabetes mellitus with hyperglycemia: Secondary | ICD-10-CM | POA: Diagnosis not present

## 2016-02-07 DIAGNOSIS — I4891 Unspecified atrial fibrillation: Secondary | ICD-10-CM | POA: Diagnosis not present

## 2016-02-07 DIAGNOSIS — D649 Anemia, unspecified: Secondary | ICD-10-CM | POA: Diagnosis not present

## 2016-02-11 DIAGNOSIS — I4891 Unspecified atrial fibrillation: Secondary | ICD-10-CM | POA: Diagnosis not present

## 2016-02-11 DIAGNOSIS — I10 Essential (primary) hypertension: Secondary | ICD-10-CM | POA: Diagnosis not present

## 2016-02-11 DIAGNOSIS — Z87440 Personal history of urinary (tract) infections: Secondary | ICD-10-CM | POA: Diagnosis not present

## 2016-02-11 DIAGNOSIS — D649 Anemia, unspecified: Secondary | ICD-10-CM | POA: Diagnosis not present

## 2016-02-11 DIAGNOSIS — E1142 Type 2 diabetes mellitus with diabetic polyneuropathy: Secondary | ICD-10-CM | POA: Diagnosis not present

## 2016-02-11 DIAGNOSIS — E1165 Type 2 diabetes mellitus with hyperglycemia: Secondary | ICD-10-CM | POA: Diagnosis not present

## 2016-02-12 DIAGNOSIS — F329 Major depressive disorder, single episode, unspecified: Secondary | ICD-10-CM | POA: Diagnosis not present

## 2016-02-12 DIAGNOSIS — I10 Essential (primary) hypertension: Secondary | ICD-10-CM | POA: Diagnosis not present

## 2016-02-12 DIAGNOSIS — K219 Gastro-esophageal reflux disease without esophagitis: Secondary | ICD-10-CM | POA: Diagnosis not present

## 2016-02-12 DIAGNOSIS — E1140 Type 2 diabetes mellitus with diabetic neuropathy, unspecified: Secondary | ICD-10-CM | POA: Diagnosis not present

## 2016-02-12 DIAGNOSIS — E039 Hypothyroidism, unspecified: Secondary | ICD-10-CM | POA: Diagnosis not present

## 2016-02-12 DIAGNOSIS — G2581 Restless legs syndrome: Secondary | ICD-10-CM | POA: Diagnosis not present

## 2016-02-12 DIAGNOSIS — E875 Hyperkalemia: Secondary | ICD-10-CM | POA: Diagnosis not present

## 2016-02-12 DIAGNOSIS — E538 Deficiency of other specified B group vitamins: Secondary | ICD-10-CM | POA: Diagnosis not present

## 2016-02-12 DIAGNOSIS — G629 Polyneuropathy, unspecified: Secondary | ICD-10-CM | POA: Diagnosis not present

## 2016-02-12 DIAGNOSIS — G8929 Other chronic pain: Secondary | ICD-10-CM | POA: Diagnosis not present

## 2016-02-12 DIAGNOSIS — N183 Chronic kidney disease, stage 3 (moderate): Secondary | ICD-10-CM | POA: Diagnosis not present

## 2016-02-13 DIAGNOSIS — I10 Essential (primary) hypertension: Secondary | ICD-10-CM | POA: Diagnosis not present

## 2016-02-13 DIAGNOSIS — I4891 Unspecified atrial fibrillation: Secondary | ICD-10-CM | POA: Diagnosis not present

## 2016-02-13 DIAGNOSIS — E1142 Type 2 diabetes mellitus with diabetic polyneuropathy: Secondary | ICD-10-CM | POA: Diagnosis not present

## 2016-02-13 DIAGNOSIS — Z87440 Personal history of urinary (tract) infections: Secondary | ICD-10-CM | POA: Diagnosis not present

## 2016-02-13 DIAGNOSIS — D649 Anemia, unspecified: Secondary | ICD-10-CM | POA: Diagnosis not present

## 2016-02-13 DIAGNOSIS — E1165 Type 2 diabetes mellitus with hyperglycemia: Secondary | ICD-10-CM | POA: Diagnosis not present

## 2016-02-15 DIAGNOSIS — I10 Essential (primary) hypertension: Secondary | ICD-10-CM | POA: Diagnosis not present

## 2016-02-15 DIAGNOSIS — I4891 Unspecified atrial fibrillation: Secondary | ICD-10-CM | POA: Diagnosis not present

## 2016-02-15 DIAGNOSIS — K219 Gastro-esophageal reflux disease without esophagitis: Secondary | ICD-10-CM | POA: Diagnosis not present

## 2016-02-15 DIAGNOSIS — D649 Anemia, unspecified: Secondary | ICD-10-CM | POA: Diagnosis not present

## 2016-02-15 DIAGNOSIS — E875 Hyperkalemia: Secondary | ICD-10-CM | POA: Diagnosis not present

## 2016-02-15 DIAGNOSIS — E538 Deficiency of other specified B group vitamins: Secondary | ICD-10-CM | POA: Diagnosis not present

## 2016-02-15 DIAGNOSIS — G2581 Restless legs syndrome: Secondary | ICD-10-CM | POA: Diagnosis not present

## 2016-02-15 DIAGNOSIS — E039 Hypothyroidism, unspecified: Secondary | ICD-10-CM | POA: Diagnosis not present

## 2016-02-15 DIAGNOSIS — G629 Polyneuropathy, unspecified: Secondary | ICD-10-CM | POA: Diagnosis not present

## 2016-02-15 DIAGNOSIS — N183 Chronic kidney disease, stage 3 (moderate): Secondary | ICD-10-CM | POA: Diagnosis not present

## 2016-02-15 DIAGNOSIS — E1142 Type 2 diabetes mellitus with diabetic polyneuropathy: Secondary | ICD-10-CM | POA: Diagnosis not present

## 2016-02-15 DIAGNOSIS — G8929 Other chronic pain: Secondary | ICD-10-CM | POA: Diagnosis not present

## 2016-02-15 DIAGNOSIS — F329 Major depressive disorder, single episode, unspecified: Secondary | ICD-10-CM | POA: Diagnosis not present

## 2016-02-15 DIAGNOSIS — E1140 Type 2 diabetes mellitus with diabetic neuropathy, unspecified: Secondary | ICD-10-CM | POA: Diagnosis not present

## 2016-02-15 DIAGNOSIS — Z87440 Personal history of urinary (tract) infections: Secondary | ICD-10-CM | POA: Diagnosis not present

## 2016-02-15 DIAGNOSIS — E1165 Type 2 diabetes mellitus with hyperglycemia: Secondary | ICD-10-CM | POA: Diagnosis not present

## 2016-02-18 DIAGNOSIS — Z87440 Personal history of urinary (tract) infections: Secondary | ICD-10-CM | POA: Diagnosis not present

## 2016-02-18 DIAGNOSIS — E1142 Type 2 diabetes mellitus with diabetic polyneuropathy: Secondary | ICD-10-CM | POA: Diagnosis not present

## 2016-02-18 DIAGNOSIS — I10 Essential (primary) hypertension: Secondary | ICD-10-CM | POA: Diagnosis not present

## 2016-02-18 DIAGNOSIS — I4891 Unspecified atrial fibrillation: Secondary | ICD-10-CM | POA: Diagnosis not present

## 2016-02-18 DIAGNOSIS — E1165 Type 2 diabetes mellitus with hyperglycemia: Secondary | ICD-10-CM | POA: Diagnosis not present

## 2016-02-18 DIAGNOSIS — D649 Anemia, unspecified: Secondary | ICD-10-CM | POA: Diagnosis not present

## 2016-02-20 DIAGNOSIS — E1165 Type 2 diabetes mellitus with hyperglycemia: Secondary | ICD-10-CM | POA: Diagnosis not present

## 2016-02-20 DIAGNOSIS — Z87440 Personal history of urinary (tract) infections: Secondary | ICD-10-CM | POA: Diagnosis not present

## 2016-02-20 DIAGNOSIS — I4891 Unspecified atrial fibrillation: Secondary | ICD-10-CM | POA: Diagnosis not present

## 2016-02-20 DIAGNOSIS — I10 Essential (primary) hypertension: Secondary | ICD-10-CM | POA: Diagnosis not present

## 2016-02-20 DIAGNOSIS — E1142 Type 2 diabetes mellitus with diabetic polyneuropathy: Secondary | ICD-10-CM | POA: Diagnosis not present

## 2016-02-20 DIAGNOSIS — D649 Anemia, unspecified: Secondary | ICD-10-CM | POA: Diagnosis not present

## 2016-02-21 DIAGNOSIS — I4891 Unspecified atrial fibrillation: Secondary | ICD-10-CM | POA: Diagnosis not present

## 2016-02-21 DIAGNOSIS — I10 Essential (primary) hypertension: Secondary | ICD-10-CM | POA: Diagnosis not present

## 2016-02-21 DIAGNOSIS — E1165 Type 2 diabetes mellitus with hyperglycemia: Secondary | ICD-10-CM | POA: Diagnosis not present

## 2016-02-21 DIAGNOSIS — E1142 Type 2 diabetes mellitus with diabetic polyneuropathy: Secondary | ICD-10-CM | POA: Diagnosis not present

## 2016-02-21 DIAGNOSIS — D649 Anemia, unspecified: Secondary | ICD-10-CM | POA: Diagnosis not present

## 2016-02-21 DIAGNOSIS — Z87440 Personal history of urinary (tract) infections: Secondary | ICD-10-CM | POA: Diagnosis not present

## 2016-02-22 DIAGNOSIS — Z87440 Personal history of urinary (tract) infections: Secondary | ICD-10-CM | POA: Diagnosis not present

## 2016-02-22 DIAGNOSIS — I10 Essential (primary) hypertension: Secondary | ICD-10-CM | POA: Diagnosis not present

## 2016-02-22 DIAGNOSIS — E1165 Type 2 diabetes mellitus with hyperglycemia: Secondary | ICD-10-CM | POA: Diagnosis not present

## 2016-02-22 DIAGNOSIS — D649 Anemia, unspecified: Secondary | ICD-10-CM | POA: Diagnosis not present

## 2016-02-22 DIAGNOSIS — I4891 Unspecified atrial fibrillation: Secondary | ICD-10-CM | POA: Diagnosis not present

## 2016-02-22 DIAGNOSIS — E1142 Type 2 diabetes mellitus with diabetic polyneuropathy: Secondary | ICD-10-CM | POA: Diagnosis not present

## 2016-02-25 DIAGNOSIS — E1165 Type 2 diabetes mellitus with hyperglycemia: Secondary | ICD-10-CM | POA: Diagnosis not present

## 2016-02-25 DIAGNOSIS — Z87440 Personal history of urinary (tract) infections: Secondary | ICD-10-CM | POA: Diagnosis not present

## 2016-02-25 DIAGNOSIS — D649 Anemia, unspecified: Secondary | ICD-10-CM | POA: Diagnosis not present

## 2016-02-25 DIAGNOSIS — I10 Essential (primary) hypertension: Secondary | ICD-10-CM | POA: Diagnosis not present

## 2016-02-25 DIAGNOSIS — I4891 Unspecified atrial fibrillation: Secondary | ICD-10-CM | POA: Diagnosis not present

## 2016-02-25 DIAGNOSIS — E1142 Type 2 diabetes mellitus with diabetic polyneuropathy: Secondary | ICD-10-CM | POA: Diagnosis not present

## 2016-02-27 DIAGNOSIS — Z87440 Personal history of urinary (tract) infections: Secondary | ICD-10-CM | POA: Diagnosis not present

## 2016-02-27 DIAGNOSIS — E1142 Type 2 diabetes mellitus with diabetic polyneuropathy: Secondary | ICD-10-CM | POA: Diagnosis not present

## 2016-02-27 DIAGNOSIS — D649 Anemia, unspecified: Secondary | ICD-10-CM | POA: Diagnosis not present

## 2016-02-27 DIAGNOSIS — I10 Essential (primary) hypertension: Secondary | ICD-10-CM | POA: Diagnosis not present

## 2016-02-27 DIAGNOSIS — I4891 Unspecified atrial fibrillation: Secondary | ICD-10-CM | POA: Diagnosis not present

## 2016-02-27 DIAGNOSIS — E1165 Type 2 diabetes mellitus with hyperglycemia: Secondary | ICD-10-CM | POA: Diagnosis not present

## 2016-02-28 DIAGNOSIS — Z87440 Personal history of urinary (tract) infections: Secondary | ICD-10-CM | POA: Diagnosis not present

## 2016-02-28 DIAGNOSIS — D649 Anemia, unspecified: Secondary | ICD-10-CM | POA: Diagnosis not present

## 2016-02-28 DIAGNOSIS — E1165 Type 2 diabetes mellitus with hyperglycemia: Secondary | ICD-10-CM | POA: Diagnosis not present

## 2016-02-28 DIAGNOSIS — E1142 Type 2 diabetes mellitus with diabetic polyneuropathy: Secondary | ICD-10-CM | POA: Diagnosis not present

## 2016-02-28 DIAGNOSIS — I10 Essential (primary) hypertension: Secondary | ICD-10-CM | POA: Diagnosis not present

## 2016-02-28 DIAGNOSIS — I4891 Unspecified atrial fibrillation: Secondary | ICD-10-CM | POA: Diagnosis not present

## 2016-03-03 DIAGNOSIS — D649 Anemia, unspecified: Secondary | ICD-10-CM | POA: Diagnosis not present

## 2016-03-03 DIAGNOSIS — Z87440 Personal history of urinary (tract) infections: Secondary | ICD-10-CM | POA: Diagnosis not present

## 2016-03-03 DIAGNOSIS — I10 Essential (primary) hypertension: Secondary | ICD-10-CM | POA: Diagnosis not present

## 2016-03-03 DIAGNOSIS — E1165 Type 2 diabetes mellitus with hyperglycemia: Secondary | ICD-10-CM | POA: Diagnosis not present

## 2016-03-03 DIAGNOSIS — E1142 Type 2 diabetes mellitus with diabetic polyneuropathy: Secondary | ICD-10-CM | POA: Diagnosis not present

## 2016-03-03 DIAGNOSIS — I4891 Unspecified atrial fibrillation: Secondary | ICD-10-CM | POA: Diagnosis not present

## 2016-03-05 DIAGNOSIS — E1165 Type 2 diabetes mellitus with hyperglycemia: Secondary | ICD-10-CM | POA: Diagnosis not present

## 2016-03-05 DIAGNOSIS — D649 Anemia, unspecified: Secondary | ICD-10-CM | POA: Diagnosis not present

## 2016-03-05 DIAGNOSIS — Z87440 Personal history of urinary (tract) infections: Secondary | ICD-10-CM | POA: Diagnosis not present

## 2016-03-05 DIAGNOSIS — I10 Essential (primary) hypertension: Secondary | ICD-10-CM | POA: Diagnosis not present

## 2016-03-05 DIAGNOSIS — I4891 Unspecified atrial fibrillation: Secondary | ICD-10-CM | POA: Diagnosis not present

## 2016-03-05 DIAGNOSIS — E1142 Type 2 diabetes mellitus with diabetic polyneuropathy: Secondary | ICD-10-CM | POA: Diagnosis not present

## 2016-03-06 DIAGNOSIS — I10 Essential (primary) hypertension: Secondary | ICD-10-CM | POA: Diagnosis not present

## 2016-03-06 DIAGNOSIS — N183 Chronic kidney disease, stage 3 (moderate): Secondary | ICD-10-CM | POA: Diagnosis not present

## 2016-03-07 DIAGNOSIS — I4891 Unspecified atrial fibrillation: Secondary | ICD-10-CM | POA: Diagnosis not present

## 2016-03-07 DIAGNOSIS — E1142 Type 2 diabetes mellitus with diabetic polyneuropathy: Secondary | ICD-10-CM | POA: Diagnosis not present

## 2016-03-07 DIAGNOSIS — E1165 Type 2 diabetes mellitus with hyperglycemia: Secondary | ICD-10-CM | POA: Diagnosis not present

## 2016-03-07 DIAGNOSIS — I10 Essential (primary) hypertension: Secondary | ICD-10-CM | POA: Diagnosis not present

## 2016-03-07 DIAGNOSIS — D649 Anemia, unspecified: Secondary | ICD-10-CM | POA: Diagnosis not present

## 2016-03-07 DIAGNOSIS — Z87440 Personal history of urinary (tract) infections: Secondary | ICD-10-CM | POA: Diagnosis not present

## 2016-03-12 DIAGNOSIS — E1165 Type 2 diabetes mellitus with hyperglycemia: Secondary | ICD-10-CM | POA: Diagnosis not present

## 2016-03-12 DIAGNOSIS — D649 Anemia, unspecified: Secondary | ICD-10-CM | POA: Diagnosis not present

## 2016-03-12 DIAGNOSIS — Z87440 Personal history of urinary (tract) infections: Secondary | ICD-10-CM | POA: Diagnosis not present

## 2016-03-12 DIAGNOSIS — E1142 Type 2 diabetes mellitus with diabetic polyneuropathy: Secondary | ICD-10-CM | POA: Diagnosis not present

## 2016-03-12 DIAGNOSIS — I4891 Unspecified atrial fibrillation: Secondary | ICD-10-CM | POA: Diagnosis not present

## 2016-03-12 DIAGNOSIS — I10 Essential (primary) hypertension: Secondary | ICD-10-CM | POA: Diagnosis not present

## 2016-03-13 DIAGNOSIS — E1142 Type 2 diabetes mellitus with diabetic polyneuropathy: Secondary | ICD-10-CM | POA: Diagnosis not present

## 2016-03-13 DIAGNOSIS — Z87440 Personal history of urinary (tract) infections: Secondary | ICD-10-CM | POA: Diagnosis not present

## 2016-03-13 DIAGNOSIS — E1165 Type 2 diabetes mellitus with hyperglycemia: Secondary | ICD-10-CM | POA: Diagnosis not present

## 2016-03-13 DIAGNOSIS — D649 Anemia, unspecified: Secondary | ICD-10-CM | POA: Diagnosis not present

## 2016-03-13 DIAGNOSIS — I10 Essential (primary) hypertension: Secondary | ICD-10-CM | POA: Diagnosis not present

## 2016-03-13 DIAGNOSIS — I4891 Unspecified atrial fibrillation: Secondary | ICD-10-CM | POA: Diagnosis not present

## 2016-03-14 DIAGNOSIS — Z87440 Personal history of urinary (tract) infections: Secondary | ICD-10-CM | POA: Diagnosis not present

## 2016-03-14 DIAGNOSIS — E1165 Type 2 diabetes mellitus with hyperglycemia: Secondary | ICD-10-CM | POA: Diagnosis not present

## 2016-03-14 DIAGNOSIS — E1142 Type 2 diabetes mellitus with diabetic polyneuropathy: Secondary | ICD-10-CM | POA: Diagnosis not present

## 2016-03-14 DIAGNOSIS — I10 Essential (primary) hypertension: Secondary | ICD-10-CM | POA: Diagnosis not present

## 2016-03-14 DIAGNOSIS — D649 Anemia, unspecified: Secondary | ICD-10-CM | POA: Diagnosis not present

## 2016-03-14 DIAGNOSIS — I4891 Unspecified atrial fibrillation: Secondary | ICD-10-CM | POA: Diagnosis not present

## 2016-03-17 DIAGNOSIS — I10 Essential (primary) hypertension: Secondary | ICD-10-CM | POA: Diagnosis not present

## 2016-03-17 DIAGNOSIS — Z87440 Personal history of urinary (tract) infections: Secondary | ICD-10-CM | POA: Diagnosis not present

## 2016-03-17 DIAGNOSIS — D649 Anemia, unspecified: Secondary | ICD-10-CM | POA: Diagnosis not present

## 2016-03-17 DIAGNOSIS — E1142 Type 2 diabetes mellitus with diabetic polyneuropathy: Secondary | ICD-10-CM | POA: Diagnosis not present

## 2016-03-17 DIAGNOSIS — E1165 Type 2 diabetes mellitus with hyperglycemia: Secondary | ICD-10-CM | POA: Diagnosis not present

## 2016-03-17 DIAGNOSIS — I4891 Unspecified atrial fibrillation: Secondary | ICD-10-CM | POA: Diagnosis not present

## 2016-03-19 DIAGNOSIS — I4891 Unspecified atrial fibrillation: Secondary | ICD-10-CM | POA: Diagnosis not present

## 2016-03-19 DIAGNOSIS — E1165 Type 2 diabetes mellitus with hyperglycemia: Secondary | ICD-10-CM | POA: Diagnosis not present

## 2016-03-19 DIAGNOSIS — I10 Essential (primary) hypertension: Secondary | ICD-10-CM | POA: Diagnosis not present

## 2016-03-19 DIAGNOSIS — D649 Anemia, unspecified: Secondary | ICD-10-CM | POA: Diagnosis not present

## 2016-03-19 DIAGNOSIS — E1142 Type 2 diabetes mellitus with diabetic polyneuropathy: Secondary | ICD-10-CM | POA: Diagnosis not present

## 2016-03-19 DIAGNOSIS — Z87440 Personal history of urinary (tract) infections: Secondary | ICD-10-CM | POA: Diagnosis not present

## 2016-03-20 DIAGNOSIS — I10 Essential (primary) hypertension: Secondary | ICD-10-CM | POA: Diagnosis not present

## 2016-03-20 DIAGNOSIS — N183 Chronic kidney disease, stage 3 (moderate): Secondary | ICD-10-CM | POA: Diagnosis not present

## 2016-03-25 DIAGNOSIS — E1165 Type 2 diabetes mellitus with hyperglycemia: Secondary | ICD-10-CM | POA: Diagnosis not present

## 2016-03-25 DIAGNOSIS — I10 Essential (primary) hypertension: Secondary | ICD-10-CM | POA: Diagnosis not present

## 2016-03-25 DIAGNOSIS — E1142 Type 2 diabetes mellitus with diabetic polyneuropathy: Secondary | ICD-10-CM | POA: Diagnosis not present

## 2016-03-25 DIAGNOSIS — D649 Anemia, unspecified: Secondary | ICD-10-CM | POA: Diagnosis not present

## 2016-03-25 DIAGNOSIS — I4891 Unspecified atrial fibrillation: Secondary | ICD-10-CM | POA: Diagnosis not present

## 2016-03-25 DIAGNOSIS — Z87440 Personal history of urinary (tract) infections: Secondary | ICD-10-CM | POA: Diagnosis not present

## 2016-03-27 DIAGNOSIS — D649 Anemia, unspecified: Secondary | ICD-10-CM | POA: Diagnosis not present

## 2016-03-27 DIAGNOSIS — I4891 Unspecified atrial fibrillation: Secondary | ICD-10-CM | POA: Diagnosis not present

## 2016-03-27 DIAGNOSIS — Z87440 Personal history of urinary (tract) infections: Secondary | ICD-10-CM | POA: Diagnosis not present

## 2016-03-27 DIAGNOSIS — E1142 Type 2 diabetes mellitus with diabetic polyneuropathy: Secondary | ICD-10-CM | POA: Diagnosis not present

## 2016-03-27 DIAGNOSIS — I10 Essential (primary) hypertension: Secondary | ICD-10-CM | POA: Diagnosis not present

## 2016-03-27 DIAGNOSIS — E1165 Type 2 diabetes mellitus with hyperglycemia: Secondary | ICD-10-CM | POA: Diagnosis not present

## 2016-03-28 DIAGNOSIS — E039 Hypothyroidism, unspecified: Secondary | ICD-10-CM | POA: Diagnosis not present

## 2016-03-28 DIAGNOSIS — E538 Deficiency of other specified B group vitamins: Secondary | ICD-10-CM | POA: Diagnosis not present

## 2016-03-28 DIAGNOSIS — E1140 Type 2 diabetes mellitus with diabetic neuropathy, unspecified: Secondary | ICD-10-CM | POA: Diagnosis not present

## 2016-03-28 DIAGNOSIS — K219 Gastro-esophageal reflux disease without esophagitis: Secondary | ICD-10-CM | POA: Diagnosis not present

## 2016-03-28 DIAGNOSIS — G2581 Restless legs syndrome: Secondary | ICD-10-CM | POA: Diagnosis not present

## 2016-03-28 DIAGNOSIS — G629 Polyneuropathy, unspecified: Secondary | ICD-10-CM | POA: Diagnosis not present

## 2016-03-28 DIAGNOSIS — N183 Chronic kidney disease, stage 3 (moderate): Secondary | ICD-10-CM | POA: Diagnosis not present

## 2016-03-28 DIAGNOSIS — G8929 Other chronic pain: Secondary | ICD-10-CM | POA: Diagnosis not present

## 2016-03-28 DIAGNOSIS — I10 Essential (primary) hypertension: Secondary | ICD-10-CM | POA: Diagnosis not present

## 2016-03-28 DIAGNOSIS — F329 Major depressive disorder, single episode, unspecified: Secondary | ICD-10-CM | POA: Diagnosis not present

## 2016-04-01 DIAGNOSIS — D649 Anemia, unspecified: Secondary | ICD-10-CM | POA: Diagnosis not present

## 2016-04-01 DIAGNOSIS — I4891 Unspecified atrial fibrillation: Secondary | ICD-10-CM | POA: Diagnosis not present

## 2016-04-01 DIAGNOSIS — E1142 Type 2 diabetes mellitus with diabetic polyneuropathy: Secondary | ICD-10-CM | POA: Diagnosis not present

## 2016-04-01 DIAGNOSIS — E1165 Type 2 diabetes mellitus with hyperglycemia: Secondary | ICD-10-CM | POA: Diagnosis not present

## 2016-04-01 DIAGNOSIS — Z87440 Personal history of urinary (tract) infections: Secondary | ICD-10-CM | POA: Diagnosis not present

## 2016-04-01 DIAGNOSIS — I10 Essential (primary) hypertension: Secondary | ICD-10-CM | POA: Diagnosis not present

## 2016-04-03 DIAGNOSIS — D649 Anemia, unspecified: Secondary | ICD-10-CM | POA: Diagnosis not present

## 2016-04-03 DIAGNOSIS — Z87440 Personal history of urinary (tract) infections: Secondary | ICD-10-CM | POA: Diagnosis not present

## 2016-04-03 DIAGNOSIS — E1165 Type 2 diabetes mellitus with hyperglycemia: Secondary | ICD-10-CM | POA: Diagnosis not present

## 2016-04-03 DIAGNOSIS — I4891 Unspecified atrial fibrillation: Secondary | ICD-10-CM | POA: Diagnosis not present

## 2016-04-03 DIAGNOSIS — E1142 Type 2 diabetes mellitus with diabetic polyneuropathy: Secondary | ICD-10-CM | POA: Diagnosis not present

## 2016-04-03 DIAGNOSIS — I10 Essential (primary) hypertension: Secondary | ICD-10-CM | POA: Diagnosis not present

## 2016-04-17 DIAGNOSIS — N183 Chronic kidney disease, stage 3 (moderate): Secondary | ICD-10-CM | POA: Diagnosis not present

## 2016-04-17 DIAGNOSIS — I10 Essential (primary) hypertension: Secondary | ICD-10-CM | POA: Diagnosis not present

## 2016-04-22 DIAGNOSIS — I10 Essential (primary) hypertension: Secondary | ICD-10-CM | POA: Diagnosis not present

## 2016-04-22 DIAGNOSIS — G8929 Other chronic pain: Secondary | ICD-10-CM | POA: Diagnosis not present

## 2016-04-22 DIAGNOSIS — K219 Gastro-esophageal reflux disease without esophagitis: Secondary | ICD-10-CM | POA: Diagnosis not present

## 2016-04-22 DIAGNOSIS — E1140 Type 2 diabetes mellitus with diabetic neuropathy, unspecified: Secondary | ICD-10-CM | POA: Diagnosis not present

## 2016-04-22 DIAGNOSIS — E538 Deficiency of other specified B group vitamins: Secondary | ICD-10-CM | POA: Diagnosis not present

## 2016-04-22 DIAGNOSIS — N183 Chronic kidney disease, stage 3 (moderate): Secondary | ICD-10-CM | POA: Diagnosis not present

## 2016-04-22 DIAGNOSIS — E039 Hypothyroidism, unspecified: Secondary | ICD-10-CM | POA: Diagnosis not present

## 2016-04-22 DIAGNOSIS — G2581 Restless legs syndrome: Secondary | ICD-10-CM | POA: Diagnosis not present

## 2016-04-22 DIAGNOSIS — N390 Urinary tract infection, site not specified: Secondary | ICD-10-CM | POA: Diagnosis not present

## 2016-04-22 DIAGNOSIS — G629 Polyneuropathy, unspecified: Secondary | ICD-10-CM | POA: Diagnosis not present

## 2016-04-22 DIAGNOSIS — F329 Major depressive disorder, single episode, unspecified: Secondary | ICD-10-CM | POA: Diagnosis not present

## 2016-05-06 DIAGNOSIS — N183 Chronic kidney disease, stage 3 (moderate): Secondary | ICD-10-CM | POA: Diagnosis not present

## 2016-05-06 DIAGNOSIS — E039 Hypothyroidism, unspecified: Secondary | ICD-10-CM | POA: Diagnosis not present

## 2016-05-06 DIAGNOSIS — G2581 Restless legs syndrome: Secondary | ICD-10-CM | POA: Diagnosis not present

## 2016-05-06 DIAGNOSIS — D472 Monoclonal gammopathy: Secondary | ICD-10-CM | POA: Diagnosis not present

## 2016-05-06 DIAGNOSIS — K219 Gastro-esophageal reflux disease without esophagitis: Secondary | ICD-10-CM | POA: Diagnosis not present

## 2016-05-06 DIAGNOSIS — F329 Major depressive disorder, single episode, unspecified: Secondary | ICD-10-CM | POA: Diagnosis not present

## 2016-05-06 DIAGNOSIS — G8929 Other chronic pain: Secondary | ICD-10-CM | POA: Diagnosis not present

## 2016-05-06 DIAGNOSIS — I10 Essential (primary) hypertension: Secondary | ICD-10-CM | POA: Diagnosis not present

## 2016-05-06 DIAGNOSIS — E538 Deficiency of other specified B group vitamins: Secondary | ICD-10-CM | POA: Diagnosis not present

## 2016-05-06 DIAGNOSIS — E1140 Type 2 diabetes mellitus with diabetic neuropathy, unspecified: Secondary | ICD-10-CM | POA: Diagnosis not present

## 2016-05-06 DIAGNOSIS — G629 Polyneuropathy, unspecified: Secondary | ICD-10-CM | POA: Diagnosis not present

## 2016-05-06 DIAGNOSIS — E875 Hyperkalemia: Secondary | ICD-10-CM | POA: Diagnosis not present

## 2016-05-15 DIAGNOSIS — R531 Weakness: Secondary | ICD-10-CM | POA: Diagnosis not present

## 2016-05-19 ENCOUNTER — Telehealth: Payer: Self-pay | Admitting: Internal Medicine

## 2016-05-19 NOTE — Telephone Encounter (Signed)
Pt states she had blood clots in her legs several years ago and she has weakness and tingling in her lower extremities. States she is so weak she can barely get around. Pt wants to know who she can see. Discussed with pt that usually pts would see their PCP and be referred to physical therapy for issues like this. Pt states she has seen her PCP and just finished PT but is no better. Pt wants to know if Dr. Henrene Pastor has any recommendation of a specialist she might could see. Please advise.

## 2016-05-19 NOTE — Telephone Encounter (Signed)
Probably best to go through her PCP for possible specialty recommendation for these non-GI issues

## 2016-05-19 NOTE — Telephone Encounter (Signed)
Spoke with pt and she is aware.

## 2016-05-27 DIAGNOSIS — N183 Chronic kidney disease, stage 3 (moderate): Secondary | ICD-10-CM | POA: Diagnosis not present

## 2016-05-27 DIAGNOSIS — I10 Essential (primary) hypertension: Secondary | ICD-10-CM | POA: Diagnosis not present

## 2016-06-16 DIAGNOSIS — R531 Weakness: Secondary | ICD-10-CM | POA: Diagnosis not present

## 2016-06-20 DIAGNOSIS — G2581 Restless legs syndrome: Secondary | ICD-10-CM | POA: Diagnosis not present

## 2016-06-20 DIAGNOSIS — N183 Chronic kidney disease, stage 3 (moderate): Secondary | ICD-10-CM | POA: Diagnosis not present

## 2016-06-20 DIAGNOSIS — E1140 Type 2 diabetes mellitus with diabetic neuropathy, unspecified: Secondary | ICD-10-CM | POA: Diagnosis not present

## 2016-06-20 DIAGNOSIS — E039 Hypothyroidism, unspecified: Secondary | ICD-10-CM | POA: Diagnosis not present

## 2016-06-20 DIAGNOSIS — D472 Monoclonal gammopathy: Secondary | ICD-10-CM | POA: Diagnosis not present

## 2016-06-20 DIAGNOSIS — I10 Essential (primary) hypertension: Secondary | ICD-10-CM | POA: Diagnosis not present

## 2016-06-20 DIAGNOSIS — G8929 Other chronic pain: Secondary | ICD-10-CM | POA: Diagnosis not present

## 2016-06-20 DIAGNOSIS — F329 Major depressive disorder, single episode, unspecified: Secondary | ICD-10-CM | POA: Diagnosis not present

## 2016-06-20 DIAGNOSIS — G629 Polyneuropathy, unspecified: Secondary | ICD-10-CM | POA: Diagnosis not present

## 2016-06-20 DIAGNOSIS — E538 Deficiency of other specified B group vitamins: Secondary | ICD-10-CM | POA: Diagnosis not present

## 2016-06-20 DIAGNOSIS — K219 Gastro-esophageal reflux disease without esophagitis: Secondary | ICD-10-CM | POA: Diagnosis not present

## 2016-06-26 DIAGNOSIS — N183 Chronic kidney disease, stage 3 (moderate): Secondary | ICD-10-CM | POA: Diagnosis not present

## 2016-06-26 DIAGNOSIS — I10 Essential (primary) hypertension: Secondary | ICD-10-CM | POA: Diagnosis not present

## 2016-07-14 DIAGNOSIS — R531 Weakness: Secondary | ICD-10-CM | POA: Diagnosis not present

## 2016-07-16 DIAGNOSIS — I10 Essential (primary) hypertension: Secondary | ICD-10-CM | POA: Diagnosis not present

## 2016-07-16 DIAGNOSIS — N183 Chronic kidney disease, stage 3 (moderate): Secondary | ICD-10-CM | POA: Diagnosis not present

## 2016-08-01 DIAGNOSIS — G619 Inflammatory polyneuropathy, unspecified: Secondary | ICD-10-CM | POA: Diagnosis not present

## 2016-08-05 DIAGNOSIS — F331 Major depressive disorder, recurrent, moderate: Secondary | ICD-10-CM | POA: Diagnosis not present

## 2016-08-05 DIAGNOSIS — E1140 Type 2 diabetes mellitus with diabetic neuropathy, unspecified: Secondary | ICD-10-CM | POA: Diagnosis not present

## 2016-08-05 DIAGNOSIS — G2581 Restless legs syndrome: Secondary | ICD-10-CM | POA: Diagnosis not present

## 2016-08-05 DIAGNOSIS — G4700 Insomnia, unspecified: Secondary | ICD-10-CM | POA: Diagnosis not present

## 2016-08-05 DIAGNOSIS — R112 Nausea with vomiting, unspecified: Secondary | ICD-10-CM | POA: Diagnosis not present

## 2016-08-05 DIAGNOSIS — E119 Type 2 diabetes mellitus without complications: Secondary | ICD-10-CM | POA: Diagnosis not present

## 2016-08-05 DIAGNOSIS — E034 Atrophy of thyroid (acquired): Secondary | ICD-10-CM | POA: Diagnosis not present

## 2016-08-05 DIAGNOSIS — Z86718 Personal history of other venous thrombosis and embolism: Secondary | ICD-10-CM | POA: Diagnosis not present

## 2016-08-12 DIAGNOSIS — G619 Inflammatory polyneuropathy, unspecified: Secondary | ICD-10-CM | POA: Diagnosis not present

## 2016-08-26 DIAGNOSIS — M79606 Pain in leg, unspecified: Secondary | ICD-10-CM | POA: Diagnosis not present

## 2016-09-02 DIAGNOSIS — R531 Weakness: Secondary | ICD-10-CM | POA: Diagnosis not present

## 2016-09-02 DIAGNOSIS — G2581 Restless legs syndrome: Secondary | ICD-10-CM | POA: Diagnosis not present

## 2016-09-02 DIAGNOSIS — R600 Localized edema: Secondary | ICD-10-CM | POA: Diagnosis not present

## 2016-09-02 DIAGNOSIS — E1140 Type 2 diabetes mellitus with diabetic neuropathy, unspecified: Secondary | ICD-10-CM | POA: Diagnosis not present

## 2016-09-02 DIAGNOSIS — E034 Atrophy of thyroid (acquired): Secondary | ICD-10-CM | POA: Diagnosis not present

## 2016-09-03 DIAGNOSIS — E119 Type 2 diabetes mellitus without complications: Secondary | ICD-10-CM | POA: Diagnosis not present

## 2016-09-03 DIAGNOSIS — F339 Major depressive disorder, recurrent, unspecified: Secondary | ICD-10-CM | POA: Diagnosis not present

## 2016-09-03 DIAGNOSIS — G2581 Restless legs syndrome: Secondary | ICD-10-CM | POA: Diagnosis not present

## 2016-09-03 DIAGNOSIS — M1990 Unspecified osteoarthritis, unspecified site: Secondary | ICD-10-CM | POA: Diagnosis not present

## 2016-09-03 DIAGNOSIS — I10 Essential (primary) hypertension: Secondary | ICD-10-CM | POA: Diagnosis not present

## 2016-09-03 DIAGNOSIS — Z7984 Long term (current) use of oral hypoglycemic drugs: Secondary | ICD-10-CM | POA: Diagnosis not present

## 2016-09-03 DIAGNOSIS — E039 Hypothyroidism, unspecified: Secondary | ICD-10-CM | POA: Diagnosis not present

## 2016-09-03 DIAGNOSIS — E559 Vitamin D deficiency, unspecified: Secondary | ICD-10-CM | POA: Diagnosis not present

## 2016-09-03 DIAGNOSIS — D649 Anemia, unspecified: Secondary | ICD-10-CM | POA: Diagnosis not present

## 2016-09-03 DIAGNOSIS — E1142 Type 2 diabetes mellitus with diabetic polyneuropathy: Secondary | ICD-10-CM | POA: Diagnosis not present

## 2016-09-03 DIAGNOSIS — D551 Anemia due to other disorders of glutathione metabolism: Secondary | ICD-10-CM | POA: Diagnosis not present

## 2016-09-03 DIAGNOSIS — K219 Gastro-esophageal reflux disease without esophagitis: Secondary | ICD-10-CM | POA: Diagnosis not present

## 2016-09-04 DIAGNOSIS — I10 Essential (primary) hypertension: Secondary | ICD-10-CM | POA: Diagnosis not present

## 2016-09-04 DIAGNOSIS — N183 Chronic kidney disease, stage 3 (moderate): Secondary | ICD-10-CM | POA: Diagnosis not present

## 2016-09-08 DIAGNOSIS — Z7984 Long term (current) use of oral hypoglycemic drugs: Secondary | ICD-10-CM | POA: Diagnosis not present

## 2016-09-08 DIAGNOSIS — K219 Gastro-esophageal reflux disease without esophagitis: Secondary | ICD-10-CM | POA: Diagnosis not present

## 2016-09-08 DIAGNOSIS — E1142 Type 2 diabetes mellitus with diabetic polyneuropathy: Secondary | ICD-10-CM | POA: Diagnosis not present

## 2016-09-08 DIAGNOSIS — M1990 Unspecified osteoarthritis, unspecified site: Secondary | ICD-10-CM | POA: Diagnosis not present

## 2016-09-08 DIAGNOSIS — F339 Major depressive disorder, recurrent, unspecified: Secondary | ICD-10-CM | POA: Diagnosis not present

## 2016-09-08 DIAGNOSIS — G2581 Restless legs syndrome: Secondary | ICD-10-CM | POA: Diagnosis not present

## 2016-09-09 DIAGNOSIS — E1142 Type 2 diabetes mellitus with diabetic polyneuropathy: Secondary | ICD-10-CM | POA: Diagnosis not present

## 2016-09-09 DIAGNOSIS — K219 Gastro-esophageal reflux disease without esophagitis: Secondary | ICD-10-CM | POA: Diagnosis not present

## 2016-09-09 DIAGNOSIS — M1990 Unspecified osteoarthritis, unspecified site: Secondary | ICD-10-CM | POA: Diagnosis not present

## 2016-09-09 DIAGNOSIS — Z7984 Long term (current) use of oral hypoglycemic drugs: Secondary | ICD-10-CM | POA: Diagnosis not present

## 2016-09-09 DIAGNOSIS — F339 Major depressive disorder, recurrent, unspecified: Secondary | ICD-10-CM | POA: Diagnosis not present

## 2016-09-09 DIAGNOSIS — G2581 Restless legs syndrome: Secondary | ICD-10-CM | POA: Diagnosis not present

## 2016-09-10 DIAGNOSIS — F339 Major depressive disorder, recurrent, unspecified: Secondary | ICD-10-CM | POA: Diagnosis not present

## 2016-09-10 DIAGNOSIS — Z7984 Long term (current) use of oral hypoglycemic drugs: Secondary | ICD-10-CM | POA: Diagnosis not present

## 2016-09-10 DIAGNOSIS — G2581 Restless legs syndrome: Secondary | ICD-10-CM | POA: Diagnosis not present

## 2016-09-10 DIAGNOSIS — K219 Gastro-esophageal reflux disease without esophagitis: Secondary | ICD-10-CM | POA: Diagnosis not present

## 2016-09-10 DIAGNOSIS — M1990 Unspecified osteoarthritis, unspecified site: Secondary | ICD-10-CM | POA: Diagnosis not present

## 2016-09-10 DIAGNOSIS — E1142 Type 2 diabetes mellitus with diabetic polyneuropathy: Secondary | ICD-10-CM | POA: Diagnosis not present

## 2016-09-11 DIAGNOSIS — F339 Major depressive disorder, recurrent, unspecified: Secondary | ICD-10-CM | POA: Diagnosis not present

## 2016-09-11 DIAGNOSIS — Z7984 Long term (current) use of oral hypoglycemic drugs: Secondary | ICD-10-CM | POA: Diagnosis not present

## 2016-09-11 DIAGNOSIS — M1990 Unspecified osteoarthritis, unspecified site: Secondary | ICD-10-CM | POA: Diagnosis not present

## 2016-09-11 DIAGNOSIS — E1142 Type 2 diabetes mellitus with diabetic polyneuropathy: Secondary | ICD-10-CM | POA: Diagnosis not present

## 2016-09-11 DIAGNOSIS — K219 Gastro-esophageal reflux disease without esophagitis: Secondary | ICD-10-CM | POA: Diagnosis not present

## 2016-09-11 DIAGNOSIS — G2581 Restless legs syndrome: Secondary | ICD-10-CM | POA: Diagnosis not present

## 2016-09-12 ENCOUNTER — Encounter (HOSPITAL_COMMUNITY): Payer: Self-pay | Admitting: Nurse Practitioner

## 2016-09-12 ENCOUNTER — Emergency Department (HOSPITAL_COMMUNITY)
Admission: EM | Admit: 2016-09-12 | Discharge: 2016-09-12 | Disposition: A | Discharge status: Home / Self Care | Payer: Medicare Other | Attending: Emergency Medicine | Admitting: Emergency Medicine

## 2016-09-12 ENCOUNTER — Emergency Department (HOSPITAL_COMMUNITY): Payer: Medicare Other

## 2016-09-12 DIAGNOSIS — I10 Essential (primary) hypertension: Secondary | ICD-10-CM | POA: Diagnosis not present

## 2016-09-12 DIAGNOSIS — F339 Major depressive disorder, recurrent, unspecified: Secondary | ICD-10-CM | POA: Diagnosis not present

## 2016-09-12 DIAGNOSIS — G2581 Restless legs syndrome: Secondary | ICD-10-CM | POA: Diagnosis not present

## 2016-09-12 DIAGNOSIS — Z79891 Long term (current) use of opiate analgesic: Secondary | ICD-10-CM | POA: Insufficient documentation

## 2016-09-12 DIAGNOSIS — E039 Hypothyroidism, unspecified: Secondary | ICD-10-CM | POA: Diagnosis not present

## 2016-09-12 DIAGNOSIS — Z79899 Other long term (current) drug therapy: Secondary | ICD-10-CM | POA: Diagnosis not present

## 2016-09-12 DIAGNOSIS — E119 Type 2 diabetes mellitus without complications: Secondary | ICD-10-CM | POA: Diagnosis not present

## 2016-09-12 DIAGNOSIS — R531 Weakness: Secondary | ICD-10-CM | POA: Diagnosis not present

## 2016-09-12 DIAGNOSIS — R8271 Bacteriuria: Secondary | ICD-10-CM | POA: Diagnosis not present

## 2016-09-12 DIAGNOSIS — Z7984 Long term (current) use of oral hypoglycemic drugs: Secondary | ICD-10-CM | POA: Diagnosis not present

## 2016-09-12 DIAGNOSIS — R5383 Other fatigue: Secondary | ICD-10-CM | POA: Diagnosis not present

## 2016-09-12 DIAGNOSIS — E860 Dehydration: Secondary | ICD-10-CM | POA: Diagnosis not present

## 2016-09-12 DIAGNOSIS — R404 Transient alteration of awareness: Secondary | ICD-10-CM | POA: Diagnosis not present

## 2016-09-12 DIAGNOSIS — K219 Gastro-esophageal reflux disease without esophagitis: Secondary | ICD-10-CM | POA: Diagnosis not present

## 2016-09-12 DIAGNOSIS — M1990 Unspecified osteoarthritis, unspecified site: Secondary | ICD-10-CM | POA: Diagnosis not present

## 2016-09-12 DIAGNOSIS — E1142 Type 2 diabetes mellitus with diabetic polyneuropathy: Secondary | ICD-10-CM | POA: Diagnosis not present

## 2016-09-12 LAB — CBC WITH DIFFERENTIAL/PLATELET
BASOS ABS: 0 10*3/uL (ref 0.0–0.1)
Basophils Relative: 1 %
EOS ABS: 0.2 10*3/uL (ref 0.0–0.7)
EOS PCT: 3 %
HCT: 30.5 % — ABNORMAL LOW (ref 36.0–46.0)
Hemoglobin: 9.8 g/dL — ABNORMAL LOW (ref 12.0–15.0)
LYMPHS PCT: 31 %
Lymphs Abs: 1.7 10*3/uL (ref 0.7–4.0)
MCH: 29 pg (ref 26.0–34.0)
MCHC: 32.1 g/dL (ref 30.0–36.0)
MCV: 90.2 fL (ref 78.0–100.0)
MONO ABS: 0.4 10*3/uL (ref 0.1–1.0)
Monocytes Relative: 7 %
Neutro Abs: 3.3 10*3/uL (ref 1.7–7.7)
Neutrophils Relative %: 58 %
PLATELETS: 240 10*3/uL (ref 150–400)
RBC: 3.38 MIL/uL — ABNORMAL LOW (ref 3.87–5.11)
RDW: 15.1 % (ref 11.5–15.5)
WBC: 5.6 10*3/uL (ref 4.0–10.5)

## 2016-09-12 LAB — COMPREHENSIVE METABOLIC PANEL
ALT: 16 U/L (ref 14–54)
AST: 34 U/L (ref 15–41)
Albumin: 3.6 g/dL (ref 3.5–5.0)
Alkaline Phosphatase: 80 U/L (ref 38–126)
Anion gap: 11 (ref 5–15)
BUN: 43 mg/dL — ABNORMAL HIGH (ref 6–20)
CHLORIDE: 101 mmol/L (ref 101–111)
CO2: 25 mmol/L (ref 22–32)
Calcium: 9 mg/dL (ref 8.9–10.3)
Creatinine, Ser: 1.6 mg/dL — ABNORMAL HIGH (ref 0.44–1.00)
GFR, EST AFRICAN AMERICAN: 30 mL/min — AB (ref >60)
GFR, EST NON AFRICAN AMERICAN: 26 mL/min — AB (ref >60)
Glucose, Bld: 153 mg/dL — ABNORMAL HIGH (ref 65–99)
POTASSIUM: 4.7 mmol/L (ref 3.5–5.1)
SODIUM: 137 mmol/L (ref 135–145)
Total Bilirubin: 0.4 mg/dL (ref 0.3–1.2)
Total Protein: 6.3 g/dL — ABNORMAL LOW (ref 6.5–8.1)

## 2016-09-12 LAB — URINALYSIS, ROUTINE W REFLEX MICROSCOPIC
BILIRUBIN URINE: NEGATIVE
Glucose, UA: NEGATIVE mg/dL
Hgb urine dipstick: NEGATIVE
KETONES UR: NEGATIVE mg/dL
NITRITE: POSITIVE — AB
PROTEIN: NEGATIVE mg/dL
Specific Gravity, Urine: 1.014 (ref 1.005–1.030)
pH: 5.5 (ref 5.0–8.0)

## 2016-09-12 LAB — URINE MICROSCOPIC-ADD ON: RBC / HPF: NONE SEEN RBC/hpf (ref 0–5)

## 2016-09-12 LAB — BRAIN NATRIURETIC PEPTIDE: B Natriuretic Peptide: 37.6 pg/mL (ref 0.0–100.0)

## 2016-09-12 LAB — VITAMIN B12: Vitamin B-12: 1306 pg/mL — ABNORMAL HIGH (ref 180–914)

## 2016-09-12 LAB — TSH: TSH: 0.783 u[IU]/mL (ref 0.350–4.500)

## 2016-09-12 LAB — T4, FREE: FREE T4: 1.13 ng/dL — AB (ref 0.61–1.12)

## 2016-09-12 LAB — CORTISOL: Cortisol, Plasma: 9.2 ug/dL

## 2016-09-12 MED ORDER — SODIUM CHLORIDE 0.9 % IV BOLUS (SEPSIS)
1000.0000 mL | Freq: Once | INTRAVENOUS | Status: AC
  Administered 2016-09-12: 1000 mL via INTRAVENOUS

## 2016-09-12 MED ORDER — NITROFURANTOIN MONOHYD MACRO 100 MG PO CAPS: 100.0000 mg | ORAL_CAPSULE | Freq: Two times a day (BID) | ORAL | 0 refills | Status: AC

## 2016-09-12 NOTE — ED Triage Notes (Signed)
[facility] 10.2 patient has had weakness and yesterday had N/V/D no blood in stool. Increasingly weak. Hypotensive.

## 2016-09-12 NOTE — ED Notes (Signed)
Bed: BS49 Expected date:  Expected time:  Means of arrival:  Comments: EMS-hypotensive

## 2016-09-12 NOTE — ED Provider Notes (Signed)
[facility] DEPT Provider Note   CSN: [id number] Arrival date & time: 09/12/16  1549     History   Chief Complaint Chief Complaint  Patient presents with  . Weakness  . Hyptension    HPI [name] is a 82 y.o. female.  HPI CC: fatigue  Onset/Duration: 1-2 weeks Timing: constant; worsening Location: generalized Severity: moderate Modifying Factors:  Improved by: nothing  Worsened by: activity Associated Signs/Symptoms:  Pertinent (+): fatigue keeps her from walking well.  Pertinent (-): fever, chills, n/v, abd pain, chest pain, sob, blood in stool. Recent falls. Context: lives in independent living   Per family, pt has been on a decline for several years and has issues eating and hydrating appropriately even with their insistence. Pt has decline SNF placement and has home health. They state that she looks like she has lost weight.  Past Medical History:  Diagnosis Date  . Acute gastritis without mention of hemorrhage   . Anemia   . Anxiety   . Arthritis    "LLL; palm of right hand" (06/12/2015)  . Atrial fibrillation ([place])   . CAP (community acquired pneumonia) 04/03/2014  . Crohn disease ([place])   . Cyst and pseudocyst of pancreas   . Depression   . Diaphragmatic hernia without mention of obstruction or gangrene   . Diverticulosis of colon (without mention of hemorrhage)   . Duodenitis without mention of hemorrhage   . DVT (deep venous thrombosis) ([place])    "twice on the left leg; once on the right leg" (06/12/2015)  . Dysrhythmia   . Esophageal reflux   . Hiatal hernia- moderate to large 10/06/2014  . Hx pulmonary embolism   . Hypothyroidism   . Insomnia, unspecified   . Left leg DVT ([place]) jan 2013  . Migraine    hx  . OAB (overactive bladder)   . Other and unspecified hyperlipidemia   . Other malaise and fatigue   . Pancreas divisum 03/02/2015  . Pancreatic cyst   . Personal history of unspecified digestive disease   . Polymyalgia rheumatica  ([place])   . Protein-calorie malnutrition, severe ([place]) 10/02/2014  . Restless legs syndrome (RLS)   . Shingles   . Stricture and stenosis of esophagus   . Type II diabetes mellitus ([place])   . Unspecified essential hypertension   . Unspecified sleep apnea    hx of sleep apnea, none since weight loss (06/12/2015)  . Urinary frequency   . Urinary urgency     Patient Active Problem List   Diagnosis Date Noted  . AKI (acute kidney injury) ([place]) 12/04/2015  . Rectal mass   . Pancreas divisum 03/02/2015  . Physical deconditioning 03/01/2015  . Pancreatic cyst   . Hiatal hernia- moderate to large 10/06/2014  . Heme + stool 10/04/2014  . Crohn's ileocolitis ([place]) 10/04/2014  . 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 Arthritis   . Hx pulmonary embolism   . Hypothyroidism 04/11/2014  . Type 2 diabetes mellitus ([place]) 04/11/2014  . Anemia of chronic disease 04/03/2014  . Overactive bladder 01/07/2012  . DVT of leg (deep venous thrombosis) ([place]) 01/07/2012  . RESTLESS LEG SYNDROME 03/09/2008  . Essential hypertension 03/09/2008  . ESOPHAGEAL STRICTURE 03/09/2008  . Diverticulosis of large intestine 03/09/2008  . Polymyalgia rheumatica ([place]) 03/09/2008  . INSOMNIA 03/09/2008  . SLEEP APNEA 03/09/2008    Past Surgical History:  Procedure Laterality Date  . BACK SURGERY    . CATARACT EXTRACTION, BILATERAL Bilateral   . CLOSED REDUCTION NASAL  FRACTURE  08/2008   Archie Endo 04/10/2011  . COLON SURGERY  June 17, 2012   surgery for bleed  . COLONOSCOPY N/A 06/13/2015   Procedure: COLONOSCOPY;  Surgeon: Irene Shipper, MD;  Location: [facility];  Service: Endoscopy;  Laterality: N/A;  . OOPHORECTOMY  1944   not sure which ovary  . POSTERIOR LUMBAR FUSION  01/2009   L4-L5 Archie Endo 04/09/2011  . REDUCTION MAMMAPLASTY Bilateral 1980  . ROTATOR CUFF REPAIR Right   . SHOULDER OPEN ROTATOR CUFF REPAIR  08/04/2012   Procedure: ROTATOR CUFF REPAIR SHOULDER OPEN;  Surgeon: Tobi Bastos, MD;  Location: [facility];   Service: Orthopedics;  Laterality: Left;  with Anchors and Graft  . SHOULDER OPEN ROTATOR CUFF REPAIR Right 2006   Archie Endo 04/22/2011  . TONSILLECTOMY      OB History    No data available       Home Medications    Prior to Admission medications   Medication Sig Start Date End Date Taking? Authorizing Provider  acetaminophen (TYLENOL) 500 MG tablet Take 500 mg by mouth every 6 (six) hours as needed for mild pain or moderate pain.   Yes Historical Provider, MD  amitriptyline (ELAVIL) 10 MG tablet Take 10 mg by mouth at bedtime. 08/01/16  Yes Historical Provider, MD  apixaban (ELIQUIS) 2.5 MG TABS tablet Take 1 tablet (2.5 mg total) by mouth 2 (two) times daily. 10/09/14  Yes Belkys A Regalado, MD  diclofenac sodium (VOLTAREN) 1 % GEL Apply 2 g topically every morning.   Yes Historical Provider, MD  feeding supplement, GLUCERNA SHAKE, (GLUCERNA SHAKE) LIQD Take 237 mLs by mouth 4 (four) times daily - after meals and at bedtime. Patient taking differently: Take 237 mLs by mouth 2 (two) times daily as needed (nutritional supplement).  10/09/14  Yes Belkys A Regalado, MD  gabapentin (NEURONTIN) 100 MG capsule Take 100 mg by mouth 3 (three) times daily.    Yes Historical Provider, MD  levothyroxine (SYNTHROID, LEVOTHROID) 75 MCG tablet Take 75 mcg by mouth daily before breakfast.   Yes Historical Provider, MD  metFORMIN (GLUCOPHAGE) 500 MG tablet Take 1 tablet (500 mg total) by mouth 2 (two) times daily with a meal. 03/03/15  Yes Venetia Maxon Rama, MD  Multiple Vitamin (MULTIVITAMIN WITH MINERALS) TABS tablet Take 1 tablet by mouth daily.   Yes Historical Provider, MD  pantoprazole (PROTONIX) 40 MG tablet Take 40 mg by mouth daily.   Yes Historical Provider, MD  ropinirole (REQUIP) 5 MG tablet Take 5 mg by mouth at bedtime.   Yes Historical Provider, MD  hyoscyamine (LEVSIN SL) 0.125 MG SL tablet Place 1 tablet (0.125 mg total) under the tongue every 6 (six) hours as needed for cramping. Patient not  taking: Reported on 09/12/2016 03/03/15   Venetia Maxon Rama, MD  mirtazapine (REMERON) 15 MG tablet Take 15 mg by mouth at bedtime.    Historical Provider, MD  nitrofurantoin, macrocrystal-monohydrate, (MACROBID) 100 MG capsule Take 1 capsule (100 mg total) by mouth 2 (two) times daily. 09/12/16 09/17/16  Fatima Blank, MD  ondansetron (ZOFRAN) 4 MG tablet Take 1 tablet (4 mg total) by mouth every 8 (eight) hours as needed for nausea or vomiting. Patient not taking: Reported on 09/12/2016 12/11/15   Amy S Esterwood, PA-C  oxyCODONE (OXY IR/ROXICODONE) 5 MG immediate release tablet Take 2 tabs= 10 mg by mouth every 6 hours as needed for nausea. Patient not taking: Reported on 09/12/2016 03/06/15   Estill Dooms, MD  polyethylene glycol (MIRALAX / GLYCOLAX) packet Take 17 g by mouth daily. Patient not taking: Reported on 09/12/2016 03/03/15   Venetia Maxon Rama, MD    Family History Family History  Problem Relation Age of Onset  . Melanoma Daughter     died age 60  . Melanoma Son     Social History Social History  Substance Use Topics  . Smoking status: Never Smoker  . Smokeless tobacco: Never Used  . Alcohol use No     Allergies   Sulfa antibiotics and Penicillins   Review of Systems Review of Systems  Constitutional: Positive for fatigue. Negative for chills and fever.  HENT: Negative for ear pain and sore throat.   Eyes: Negative for pain and visual disturbance.  Respiratory: Negative for cough and shortness of breath.   Cardiovascular: Negative for chest pain and palpitations.  Gastrointestinal: Negative for abdominal pain and vomiting.  Genitourinary: Negative for dysuria and hematuria.  Musculoskeletal: Negative for arthralgias and back pain.  Skin: Negative for color change and rash.  Neurological: Negative for seizures and syncope.  All other systems reviewed and are negative.    Physical Exam Updated Vital Signs BP 106/65 (BP Location: Right Arm)   Pulse 87    Temp 97.7 F (36.5 C) (Oral)   Resp 18   SpO2 93%   Physical Exam  Constitutional: She is oriented to person, place, and time. She appears well-developed and well-nourished. No distress.  HENT:  Head: Normocephalic and atraumatic.  Nose: Nose normal.  Eyes: Conjunctivae and EOM are normal. Pupils are equal, round, and reactive to light. Right eye exhibits no discharge. Left eye exhibits no discharge. No scleral icterus.  Neck: Normal range of motion. Neck supple.  Cardiovascular: Normal rate and regular rhythm.  Exam reveals no gallop and no friction rub.   No murmur heard. Pulmonary/Chest: Effort normal and breath sounds normal. No stridor. No respiratory distress. She has no rales.  Abdominal: Soft. She exhibits no distension. There is no tenderness.  Musculoskeletal: She exhibits no edema or tenderness.  Neurological: She is alert and oriented to person, place, and time.  Skin: Skin is warm and dry. No rash noted. She is not diaphoretic. No erythema.  Psychiatric: She has a normal mood and affect.  Vitals reviewed.    ED Treatments / Results  Labs (all labs ordered are listed, but only abnormal results are displayed) Labs Reviewed  CBC WITH DIFFERENTIAL/PLATELET - Abnormal; Notable for the following:       Result Value   RBC 3.38 (*)    Hemoglobin 9.8 (*)    HCT 30.5 (*)    All other components within normal limits  COMPREHENSIVE METABOLIC PANEL - Abnormal; Notable for the following:    Glucose, Bld 153 (*)    BUN 43 (*)    Creatinine, Ser 1.60 (*)    Total Protein 6.3 (*)    GFR calc non Af Amer 26 (*)    GFR calc Af Amer 30 (*)    All other components within normal limits  URINALYSIS, ROUTINE W REFLEX MICROSCOPIC (NOT AT [facility]) - Abnormal; Notable for the following:    APPearance CLOUDY (*)    Nitrite POSITIVE (*)    Leukocytes, UA SMALL (*)    All other components within normal limits  URINE MICROSCOPIC-ADD ON - Abnormal; Notable for the following:    Squamous  Epithelial / LPF 0-5 (*)    Bacteria, UA MANY (*)    All other components within  normal limits  TSH  BRAIN NATRIURETIC PEPTIDE  T4, FREE  CORTISOL  VITAMIN B12    EKG  EKG Interpretation  Date/Time:  Friday September 12 2016 16:00:22 EDT Ventricular Rate:  81 PR Interval:    QRS Duration: 122 QT Interval:  402 QTC Calculation: 467 R Axis:   -73 Text Interpretation:  RBBB and LAFB Probable left ventricular hypertrophy Minimal ST elevation, lateral leads no new ischemic changes when compared to prior Confirmed by [facility] MD, [place] ([id number]) on 09/12/2016 6:00:34 PM       Radiology Dg Chest 2 View  Result Date: 09/12/2016 CLINICAL DATA:  82 y/o F; weakness today with history of atrial fibrillation. EXAM: CHEST  2 VIEW COMPARISON:  12/04/2015 chest radiograph FINDINGS: The heart size and mediastinal contours are within normal limits and stable. Both lungs are clear. The visualized skeletal structures are unremarkable. Right-sided rotator cuff repair changes. Mild degenerative changes of the spine. Bones are demineralized. IMPRESSION: No active cardiopulmonary disease. Electronically Signed   By: Kristine Garbe M.D.   On: 09/12/2016 18:18    Procedures Procedures (including critical care time)  Medications Ordered in ED Medications  sodium chloride 0.9 % bolus 1,000 mL (1,000 mLs Intravenous New Bag/Given 09/12/16 1907)     Initial Impression / Assessment and Plan / ED Course  I have reviewed the triage vital signs and the nursing notes.  Pertinent labs & imaging results that were available during my care of the patient were reviewed by me and considered in my medical decision making (see chart for details).  Clinical Course    Workup consistent with dehydration. UA does show evidence of possible urinary tract infection however patient is denying any dysuria, frequency, urgency, fevers, abdominal pain. No evidence suggesting of myxedema coma. Low suspicion for cardiac  etiology. No evidence of volume overload such as CHF exacerbation. Hemoglobin at baseline.  Significant improvement in patient's fatigue following 1 L IV fluids. Discussed findings with the caregiver and patient. With shared decision-making decided not to treat urinary tract infection at this time however will provide the patient with a prescription for antibiotics in case she does develop symptoms. We'll send urine culture.   Safe for discharge with close PCP follow-up. Caregiver will check On patient frequently throughout this weekend.    Final Clinical Impressions(s) / ED Diagnoses   Final diagnoses:  Fatigue  Bacteriuria  Dehydration   Disposition: Discharge  Condition: Good  I have discussed the results, Dx and Tx plan with the patient and care giver who expressed understanding and agree(s) with the plan. Discharge instructions discussed at great length. The patient and care giver was given strict return precautions who verbalized understanding of the instructions. No further questions at time of discharge.    New Prescriptions   NITROFURANTOIN, MACROCRYSTAL-MONOHYDRATE, (MACROBID) 100 MG CAPSULE    Take 1 capsule (100 mg total) by mouth 2 (two) times daily.    Follow Up: Benito Mccreedy, MD [address] [place] [id number] [phone]  Schedule an appointment as soon as possible for a visit in 3 days For close follow up      Fatima Blank, MD 09/12/16 2329

## 2016-09-12 NOTE — ED Notes (Signed)
Patient here for weakness and hypotension

## 2016-09-15 DIAGNOSIS — K219 Gastro-esophageal reflux disease without esophagitis: Secondary | ICD-10-CM | POA: Diagnosis not present

## 2016-09-15 DIAGNOSIS — G2581 Restless legs syndrome: Secondary | ICD-10-CM | POA: Diagnosis not present

## 2016-09-15 DIAGNOSIS — Z7984 Long term (current) use of oral hypoglycemic drugs: Secondary | ICD-10-CM | POA: Diagnosis not present

## 2016-09-15 DIAGNOSIS — E1142 Type 2 diabetes mellitus with diabetic polyneuropathy: Secondary | ICD-10-CM | POA: Diagnosis not present

## 2016-09-15 DIAGNOSIS — F339 Major depressive disorder, recurrent, unspecified: Secondary | ICD-10-CM | POA: Diagnosis not present

## 2016-09-15 DIAGNOSIS — M1990 Unspecified osteoarthritis, unspecified site: Secondary | ICD-10-CM | POA: Diagnosis not present

## 2016-09-16 ENCOUNTER — Telehealth: Payer: Self-pay | Admitting: *Deleted

## 2016-09-16 DIAGNOSIS — G2581 Restless legs syndrome: Secondary | ICD-10-CM | POA: Diagnosis not present

## 2016-09-16 DIAGNOSIS — Z7984 Long term (current) use of oral hypoglycemic drugs: Secondary | ICD-10-CM | POA: Diagnosis not present

## 2016-09-16 DIAGNOSIS — F339 Major depressive disorder, recurrent, unspecified: Secondary | ICD-10-CM | POA: Diagnosis not present

## 2016-09-16 DIAGNOSIS — K219 Gastro-esophageal reflux disease without esophagitis: Secondary | ICD-10-CM | POA: Diagnosis not present

## 2016-09-16 DIAGNOSIS — M1990 Unspecified osteoarthritis, unspecified site: Secondary | ICD-10-CM | POA: Diagnosis not present

## 2016-09-16 DIAGNOSIS — E1142 Type 2 diabetes mellitus with diabetic polyneuropathy: Secondary | ICD-10-CM | POA: Diagnosis not present

## 2016-09-16 LAB — URINE CULTURE

## 2016-09-16 NOTE — Telephone Encounter (Signed)
Pt misplaced written Rx...call transferred from Flow Manager to give verbal Rx to Pharm D for [place].

## 2016-09-17 ENCOUNTER — Telehealth (HOSPITAL_BASED_OUTPATIENT_CLINIC_OR_DEPARTMENT_OTHER): Payer: Self-pay

## 2016-09-17 DIAGNOSIS — M1990 Unspecified osteoarthritis, unspecified site: Secondary | ICD-10-CM | POA: Diagnosis not present

## 2016-09-17 DIAGNOSIS — G2581 Restless legs syndrome: Secondary | ICD-10-CM | POA: Diagnosis not present

## 2016-09-17 DIAGNOSIS — K219 Gastro-esophageal reflux disease without esophagitis: Secondary | ICD-10-CM | POA: Diagnosis not present

## 2016-09-17 DIAGNOSIS — F339 Major depressive disorder, recurrent, unspecified: Secondary | ICD-10-CM | POA: Diagnosis not present

## 2016-09-17 DIAGNOSIS — Z7984 Long term (current) use of oral hypoglycemic drugs: Secondary | ICD-10-CM | POA: Diagnosis not present

## 2016-09-17 DIAGNOSIS — E1142 Type 2 diabetes mellitus with diabetic polyneuropathy: Secondary | ICD-10-CM | POA: Diagnosis not present

## 2016-09-17 NOTE — Telephone Encounter (Signed)
Post ED Visit - Positive Culture Follow-up  Culture report reviewed by antimicrobial stewardship pharmacist:  []  Elenor Quinones, Pharm.D. []  Heide Guile, Pharm.D., BCPS [x]  Parks Neptune, Pharm.D. []  Alycia Rossetti, Pharm.D., BCPS []  [place], [place].D., BCPS, AAHIVP []  Legrand Como, Pharm.D., BCPS, AAHIVP []  Milus Glazier, Pharm.D. []  Rob [place], [place].D.  Positive urine culture Treated with Nitrofurantoin, organism sensitive to the same and no further patient follow-up is required at this time.  Genia Del 09/17/2016, 8:44 AM

## 2016-09-18 DIAGNOSIS — K219 Gastro-esophageal reflux disease without esophagitis: Secondary | ICD-10-CM | POA: Diagnosis not present

## 2016-09-18 DIAGNOSIS — E1142 Type 2 diabetes mellitus with diabetic polyneuropathy: Secondary | ICD-10-CM | POA: Diagnosis not present

## 2016-09-18 DIAGNOSIS — G2581 Restless legs syndrome: Secondary | ICD-10-CM | POA: Diagnosis not present

## 2016-09-18 DIAGNOSIS — M1990 Unspecified osteoarthritis, unspecified site: Secondary | ICD-10-CM | POA: Diagnosis not present

## 2016-09-18 DIAGNOSIS — F339 Major depressive disorder, recurrent, unspecified: Secondary | ICD-10-CM | POA: Diagnosis not present

## 2016-09-18 DIAGNOSIS — Z7984 Long term (current) use of oral hypoglycemic drugs: Secondary | ICD-10-CM | POA: Diagnosis not present

## 2016-09-19 DIAGNOSIS — M1990 Unspecified osteoarthritis, unspecified site: Secondary | ICD-10-CM | POA: Diagnosis not present

## 2016-09-19 DIAGNOSIS — K219 Gastro-esophageal reflux disease without esophagitis: Secondary | ICD-10-CM | POA: Diagnosis not present

## 2016-09-19 DIAGNOSIS — G2581 Restless legs syndrome: Secondary | ICD-10-CM | POA: Diagnosis not present

## 2016-09-19 DIAGNOSIS — F339 Major depressive disorder, recurrent, unspecified: Secondary | ICD-10-CM | POA: Diagnosis not present

## 2016-09-19 DIAGNOSIS — E1142 Type 2 diabetes mellitus with diabetic polyneuropathy: Secondary | ICD-10-CM | POA: Diagnosis not present

## 2016-09-19 DIAGNOSIS — Z7984 Long term (current) use of oral hypoglycemic drugs: Secondary | ICD-10-CM | POA: Diagnosis not present

## 2016-09-22 DIAGNOSIS — E1142 Type 2 diabetes mellitus with diabetic polyneuropathy: Secondary | ICD-10-CM | POA: Diagnosis not present

## 2016-09-22 DIAGNOSIS — G2581 Restless legs syndrome: Secondary | ICD-10-CM | POA: Diagnosis not present

## 2016-09-22 DIAGNOSIS — Z7984 Long term (current) use of oral hypoglycemic drugs: Secondary | ICD-10-CM | POA: Diagnosis not present

## 2016-09-22 DIAGNOSIS — M1990 Unspecified osteoarthritis, unspecified site: Secondary | ICD-10-CM | POA: Diagnosis not present

## 2016-09-22 DIAGNOSIS — F339 Major depressive disorder, recurrent, unspecified: Secondary | ICD-10-CM | POA: Diagnosis not present

## 2016-09-22 DIAGNOSIS — K219 Gastro-esophageal reflux disease without esophagitis: Secondary | ICD-10-CM | POA: Diagnosis not present

## 2016-09-26 DIAGNOSIS — E1142 Type 2 diabetes mellitus with diabetic polyneuropathy: Secondary | ICD-10-CM | POA: Diagnosis not present

## 2016-09-26 DIAGNOSIS — M1990 Unspecified osteoarthritis, unspecified site: Secondary | ICD-10-CM | POA: Diagnosis not present

## 2016-09-26 DIAGNOSIS — G2581 Restless legs syndrome: Secondary | ICD-10-CM | POA: Diagnosis not present

## 2016-09-26 DIAGNOSIS — Z7984 Long term (current) use of oral hypoglycemic drugs: Secondary | ICD-10-CM | POA: Diagnosis not present

## 2016-09-26 DIAGNOSIS — F339 Major depressive disorder, recurrent, unspecified: Secondary | ICD-10-CM | POA: Diagnosis not present

## 2016-09-26 DIAGNOSIS — K219 Gastro-esophageal reflux disease without esophagitis: Secondary | ICD-10-CM | POA: Diagnosis not present

## 2016-09-30 DIAGNOSIS — F339 Major depressive disorder, recurrent, unspecified: Secondary | ICD-10-CM | POA: Diagnosis not present

## 2016-09-30 DIAGNOSIS — Z7984 Long term (current) use of oral hypoglycemic drugs: Secondary | ICD-10-CM | POA: Diagnosis not present

## 2016-09-30 DIAGNOSIS — M1990 Unspecified osteoarthritis, unspecified site: Secondary | ICD-10-CM | POA: Diagnosis not present

## 2016-09-30 DIAGNOSIS — E1142 Type 2 diabetes mellitus with diabetic polyneuropathy: Secondary | ICD-10-CM | POA: Diagnosis not present

## 2016-09-30 DIAGNOSIS — G2581 Restless legs syndrome: Secondary | ICD-10-CM | POA: Diagnosis not present

## 2016-09-30 DIAGNOSIS — K219 Gastro-esophageal reflux disease without esophagitis: Secondary | ICD-10-CM | POA: Diagnosis not present

## 2016-10-03 DIAGNOSIS — K219 Gastro-esophageal reflux disease without esophagitis: Secondary | ICD-10-CM | POA: Diagnosis not present

## 2016-10-03 DIAGNOSIS — M79606 Pain in leg, unspecified: Secondary | ICD-10-CM | POA: Diagnosis not present

## 2016-10-03 DIAGNOSIS — Z7984 Long term (current) use of oral hypoglycemic drugs: Secondary | ICD-10-CM | POA: Diagnosis not present

## 2016-10-03 DIAGNOSIS — G2581 Restless legs syndrome: Secondary | ICD-10-CM | POA: Diagnosis not present

## 2016-10-03 DIAGNOSIS — M1990 Unspecified osteoarthritis, unspecified site: Secondary | ICD-10-CM | POA: Diagnosis not present

## 2016-10-03 DIAGNOSIS — E1142 Type 2 diabetes mellitus with diabetic polyneuropathy: Secondary | ICD-10-CM | POA: Diagnosis not present

## 2016-10-03 DIAGNOSIS — N183 Chronic kidney disease, stage 3 (moderate): Secondary | ICD-10-CM | POA: Diagnosis not present

## 2016-10-03 DIAGNOSIS — F339 Major depressive disorder, recurrent, unspecified: Secondary | ICD-10-CM | POA: Diagnosis not present

## 2016-10-03 DIAGNOSIS — I10 Essential (primary) hypertension: Secondary | ICD-10-CM | POA: Diagnosis not present

## 2016-10-06 DIAGNOSIS — Z7984 Long term (current) use of oral hypoglycemic drugs: Secondary | ICD-10-CM | POA: Diagnosis not present

## 2016-10-06 DIAGNOSIS — M1990 Unspecified osteoarthritis, unspecified site: Secondary | ICD-10-CM | POA: Diagnosis not present

## 2016-10-06 DIAGNOSIS — F339 Major depressive disorder, recurrent, unspecified: Secondary | ICD-10-CM | POA: Diagnosis not present

## 2016-10-06 DIAGNOSIS — K219 Gastro-esophageal reflux disease without esophagitis: Secondary | ICD-10-CM | POA: Diagnosis not present

## 2016-10-06 DIAGNOSIS — E1142 Type 2 diabetes mellitus with diabetic polyneuropathy: Secondary | ICD-10-CM | POA: Diagnosis not present

## 2016-10-06 DIAGNOSIS — G2581 Restless legs syndrome: Secondary | ICD-10-CM | POA: Diagnosis not present

## 2016-10-08 DIAGNOSIS — G2581 Restless legs syndrome: Secondary | ICD-10-CM | POA: Diagnosis not present

## 2016-10-08 DIAGNOSIS — M1990 Unspecified osteoarthritis, unspecified site: Secondary | ICD-10-CM | POA: Diagnosis not present

## 2016-10-08 DIAGNOSIS — E1142 Type 2 diabetes mellitus with diabetic polyneuropathy: Secondary | ICD-10-CM | POA: Diagnosis not present

## 2016-10-08 DIAGNOSIS — Z7984 Long term (current) use of oral hypoglycemic drugs: Secondary | ICD-10-CM | POA: Diagnosis not present

## 2016-10-08 DIAGNOSIS — F339 Major depressive disorder, recurrent, unspecified: Secondary | ICD-10-CM | POA: Diagnosis not present

## 2016-10-08 DIAGNOSIS — K219 Gastro-esophageal reflux disease without esophagitis: Secondary | ICD-10-CM | POA: Diagnosis not present

## 2016-10-10 DIAGNOSIS — Z7984 Long term (current) use of oral hypoglycemic drugs: Secondary | ICD-10-CM | POA: Diagnosis not present

## 2016-10-10 DIAGNOSIS — E1142 Type 2 diabetes mellitus with diabetic polyneuropathy: Secondary | ICD-10-CM | POA: Diagnosis not present

## 2016-10-10 DIAGNOSIS — F339 Major depressive disorder, recurrent, unspecified: Secondary | ICD-10-CM | POA: Diagnosis not present

## 2016-10-10 DIAGNOSIS — M1990 Unspecified osteoarthritis, unspecified site: Secondary | ICD-10-CM | POA: Diagnosis not present

## 2016-10-10 DIAGNOSIS — G2581 Restless legs syndrome: Secondary | ICD-10-CM | POA: Diagnosis not present

## 2016-10-10 DIAGNOSIS — K219 Gastro-esophageal reflux disease without esophagitis: Secondary | ICD-10-CM | POA: Diagnosis not present

## 2016-10-13 DIAGNOSIS — F339 Major depressive disorder, recurrent, unspecified: Secondary | ICD-10-CM | POA: Diagnosis not present

## 2016-10-13 DIAGNOSIS — E1142 Type 2 diabetes mellitus with diabetic polyneuropathy: Secondary | ICD-10-CM | POA: Diagnosis not present

## 2016-10-13 DIAGNOSIS — G2581 Restless legs syndrome: Secondary | ICD-10-CM | POA: Diagnosis not present

## 2016-10-13 DIAGNOSIS — K219 Gastro-esophageal reflux disease without esophagitis: Secondary | ICD-10-CM | POA: Diagnosis not present

## 2016-10-13 DIAGNOSIS — M1990 Unspecified osteoarthritis, unspecified site: Secondary | ICD-10-CM | POA: Diagnosis not present

## 2016-10-13 DIAGNOSIS — Z7984 Long term (current) use of oral hypoglycemic drugs: Secondary | ICD-10-CM | POA: Diagnosis not present

## 2016-10-15 DIAGNOSIS — G2581 Restless legs syndrome: Secondary | ICD-10-CM | POA: Diagnosis not present

## 2016-10-15 DIAGNOSIS — E1142 Type 2 diabetes mellitus with diabetic polyneuropathy: Secondary | ICD-10-CM | POA: Diagnosis not present

## 2016-10-15 DIAGNOSIS — M1990 Unspecified osteoarthritis, unspecified site: Secondary | ICD-10-CM | POA: Diagnosis not present

## 2016-10-15 DIAGNOSIS — K219 Gastro-esophageal reflux disease without esophagitis: Secondary | ICD-10-CM | POA: Diagnosis not present

## 2016-10-15 DIAGNOSIS — F339 Major depressive disorder, recurrent, unspecified: Secondary | ICD-10-CM | POA: Diagnosis not present

## 2016-10-15 DIAGNOSIS — Z7984 Long term (current) use of oral hypoglycemic drugs: Secondary | ICD-10-CM | POA: Diagnosis not present

## 2016-10-16 DIAGNOSIS — M1990 Unspecified osteoarthritis, unspecified site: Secondary | ICD-10-CM | POA: Diagnosis not present

## 2016-10-16 DIAGNOSIS — Z7984 Long term (current) use of oral hypoglycemic drugs: Secondary | ICD-10-CM | POA: Diagnosis not present

## 2016-10-16 DIAGNOSIS — E1142 Type 2 diabetes mellitus with diabetic polyneuropathy: Secondary | ICD-10-CM | POA: Diagnosis not present

## 2016-10-16 DIAGNOSIS — G2581 Restless legs syndrome: Secondary | ICD-10-CM | POA: Diagnosis not present

## 2016-10-16 DIAGNOSIS — K219 Gastro-esophageal reflux disease without esophagitis: Secondary | ICD-10-CM | POA: Diagnosis not present

## 2016-10-16 DIAGNOSIS — F339 Major depressive disorder, recurrent, unspecified: Secondary | ICD-10-CM | POA: Diagnosis not present

## 2016-10-22 DIAGNOSIS — M1990 Unspecified osteoarthritis, unspecified site: Secondary | ICD-10-CM | POA: Diagnosis not present

## 2016-10-22 DIAGNOSIS — E1142 Type 2 diabetes mellitus with diabetic polyneuropathy: Secondary | ICD-10-CM | POA: Diagnosis not present

## 2016-10-22 DIAGNOSIS — G2581 Restless legs syndrome: Secondary | ICD-10-CM | POA: Diagnosis not present

## 2016-10-22 DIAGNOSIS — F339 Major depressive disorder, recurrent, unspecified: Secondary | ICD-10-CM | POA: Diagnosis not present

## 2016-10-22 DIAGNOSIS — K219 Gastro-esophageal reflux disease without esophagitis: Secondary | ICD-10-CM | POA: Diagnosis not present

## 2016-10-22 DIAGNOSIS — Z7984 Long term (current) use of oral hypoglycemic drugs: Secondary | ICD-10-CM | POA: Diagnosis not present

## 2016-10-24 DIAGNOSIS — K219 Gastro-esophageal reflux disease without esophagitis: Secondary | ICD-10-CM | POA: Diagnosis not present

## 2016-10-24 DIAGNOSIS — G2581 Restless legs syndrome: Secondary | ICD-10-CM | POA: Diagnosis not present

## 2016-10-24 DIAGNOSIS — M1990 Unspecified osteoarthritis, unspecified site: Secondary | ICD-10-CM | POA: Diagnosis not present

## 2016-10-24 DIAGNOSIS — E1142 Type 2 diabetes mellitus with diabetic polyneuropathy: Secondary | ICD-10-CM | POA: Diagnosis not present

## 2016-10-24 DIAGNOSIS — Z7984 Long term (current) use of oral hypoglycemic drugs: Secondary | ICD-10-CM | POA: Diagnosis not present

## 2016-10-24 DIAGNOSIS — F339 Major depressive disorder, recurrent, unspecified: Secondary | ICD-10-CM | POA: Diagnosis not present

## 2016-10-27 DIAGNOSIS — E1142 Type 2 diabetes mellitus with diabetic polyneuropathy: Secondary | ICD-10-CM | POA: Diagnosis not present

## 2016-10-27 DIAGNOSIS — K219 Gastro-esophageal reflux disease without esophagitis: Secondary | ICD-10-CM | POA: Diagnosis not present

## 2016-10-27 DIAGNOSIS — M1990 Unspecified osteoarthritis, unspecified site: Secondary | ICD-10-CM | POA: Diagnosis not present

## 2016-10-27 DIAGNOSIS — G2581 Restless legs syndrome: Secondary | ICD-10-CM | POA: Diagnosis not present

## 2016-10-27 DIAGNOSIS — F339 Major depressive disorder, recurrent, unspecified: Secondary | ICD-10-CM | POA: Diagnosis not present

## 2016-10-27 DIAGNOSIS — Z7984 Long term (current) use of oral hypoglycemic drugs: Secondary | ICD-10-CM | POA: Diagnosis not present

## 2016-10-28 DIAGNOSIS — M1990 Unspecified osteoarthritis, unspecified site: Secondary | ICD-10-CM | POA: Diagnosis not present

## 2016-10-28 DIAGNOSIS — F339 Major depressive disorder, recurrent, unspecified: Secondary | ICD-10-CM | POA: Diagnosis not present

## 2016-10-28 DIAGNOSIS — Z7984 Long term (current) use of oral hypoglycemic drugs: Secondary | ICD-10-CM | POA: Diagnosis not present

## 2016-10-28 DIAGNOSIS — G2581 Restless legs syndrome: Secondary | ICD-10-CM | POA: Diagnosis not present

## 2016-10-28 DIAGNOSIS — K219 Gastro-esophageal reflux disease without esophagitis: Secondary | ICD-10-CM | POA: Diagnosis not present

## 2016-10-28 DIAGNOSIS — E1142 Type 2 diabetes mellitus with diabetic polyneuropathy: Secondary | ICD-10-CM | POA: Diagnosis not present

## 2016-10-29 DIAGNOSIS — F339 Major depressive disorder, recurrent, unspecified: Secondary | ICD-10-CM | POA: Diagnosis not present

## 2016-10-29 DIAGNOSIS — K219 Gastro-esophageal reflux disease without esophagitis: Secondary | ICD-10-CM | POA: Diagnosis not present

## 2016-10-29 DIAGNOSIS — Z7984 Long term (current) use of oral hypoglycemic drugs: Secondary | ICD-10-CM | POA: Diagnosis not present

## 2016-10-29 DIAGNOSIS — M1990 Unspecified osteoarthritis, unspecified site: Secondary | ICD-10-CM | POA: Diagnosis not present

## 2016-10-29 DIAGNOSIS — E1142 Type 2 diabetes mellitus with diabetic polyneuropathy: Secondary | ICD-10-CM | POA: Diagnosis not present

## 2016-10-29 DIAGNOSIS — G2581 Restless legs syndrome: Secondary | ICD-10-CM | POA: Diagnosis not present

## 2016-10-31 DIAGNOSIS — I10 Essential (primary) hypertension: Secondary | ICD-10-CM | POA: Diagnosis not present

## 2016-10-31 DIAGNOSIS — N183 Chronic kidney disease, stage 3 (moderate): Secondary | ICD-10-CM | POA: Diagnosis not present

## 2016-11-01 DIAGNOSIS — Z23 Encounter for immunization: Secondary | ICD-10-CM | POA: Diagnosis not present

## 2016-11-02 DIAGNOSIS — K219 Gastro-esophageal reflux disease without esophagitis: Secondary | ICD-10-CM | POA: Diagnosis not present

## 2016-11-02 DIAGNOSIS — Z7984 Long term (current) use of oral hypoglycemic drugs: Secondary | ICD-10-CM | POA: Diagnosis not present

## 2016-11-02 DIAGNOSIS — F339 Major depressive disorder, recurrent, unspecified: Secondary | ICD-10-CM | POA: Diagnosis not present

## 2016-11-02 DIAGNOSIS — M1990 Unspecified osteoarthritis, unspecified site: Secondary | ICD-10-CM | POA: Diagnosis not present

## 2016-11-02 DIAGNOSIS — R531 Weakness: Secondary | ICD-10-CM | POA: Diagnosis not present

## 2016-11-02 DIAGNOSIS — G2581 Restless legs syndrome: Secondary | ICD-10-CM | POA: Diagnosis not present

## 2016-11-02 DIAGNOSIS — E1142 Type 2 diabetes mellitus with diabetic polyneuropathy: Secondary | ICD-10-CM | POA: Diagnosis not present

## 2016-11-03 DIAGNOSIS — M1990 Unspecified osteoarthritis, unspecified site: Secondary | ICD-10-CM | POA: Diagnosis not present

## 2016-11-03 DIAGNOSIS — G2581 Restless legs syndrome: Secondary | ICD-10-CM | POA: Diagnosis not present

## 2016-11-03 DIAGNOSIS — E1142 Type 2 diabetes mellitus with diabetic polyneuropathy: Secondary | ICD-10-CM | POA: Diagnosis not present

## 2016-11-03 DIAGNOSIS — R531 Weakness: Secondary | ICD-10-CM | POA: Diagnosis not present

## 2016-11-03 DIAGNOSIS — K219 Gastro-esophageal reflux disease without esophagitis: Secondary | ICD-10-CM | POA: Diagnosis not present

## 2016-11-03 DIAGNOSIS — F339 Major depressive disorder, recurrent, unspecified: Secondary | ICD-10-CM | POA: Diagnosis not present

## 2016-11-04 DIAGNOSIS — G2581 Restless legs syndrome: Secondary | ICD-10-CM | POA: Diagnosis not present

## 2016-11-04 DIAGNOSIS — R531 Weakness: Secondary | ICD-10-CM | POA: Diagnosis not present

## 2016-11-04 DIAGNOSIS — F339 Major depressive disorder, recurrent, unspecified: Secondary | ICD-10-CM | POA: Diagnosis not present

## 2016-11-04 DIAGNOSIS — E1142 Type 2 diabetes mellitus with diabetic polyneuropathy: Secondary | ICD-10-CM | POA: Diagnosis not present

## 2016-11-04 DIAGNOSIS — K219 Gastro-esophageal reflux disease without esophagitis: Secondary | ICD-10-CM | POA: Diagnosis not present

## 2016-11-04 DIAGNOSIS — M1990 Unspecified osteoarthritis, unspecified site: Secondary | ICD-10-CM | POA: Diagnosis not present

## 2016-11-06 DIAGNOSIS — F331 Major depressive disorder, recurrent, moderate: Secondary | ICD-10-CM | POA: Diagnosis not present

## 2016-11-06 DIAGNOSIS — G4700 Insomnia, unspecified: Secondary | ICD-10-CM | POA: Diagnosis not present

## 2016-11-06 DIAGNOSIS — E1140 Type 2 diabetes mellitus with diabetic neuropathy, unspecified: Secondary | ICD-10-CM | POA: Diagnosis not present

## 2016-11-06 DIAGNOSIS — Z86718 Personal history of other venous thrombosis and embolism: Secondary | ICD-10-CM | POA: Diagnosis not present

## 2016-11-06 DIAGNOSIS — E034 Atrophy of thyroid (acquired): Secondary | ICD-10-CM | POA: Diagnosis not present

## 2016-11-06 DIAGNOSIS — G2581 Restless legs syndrome: Secondary | ICD-10-CM | POA: Diagnosis not present

## 2016-11-06 DIAGNOSIS — R531 Weakness: Secondary | ICD-10-CM | POA: Diagnosis not present

## 2016-11-10 DIAGNOSIS — K219 Gastro-esophageal reflux disease without esophagitis: Secondary | ICD-10-CM | POA: Diagnosis not present

## 2016-11-10 DIAGNOSIS — M1990 Unspecified osteoarthritis, unspecified site: Secondary | ICD-10-CM | POA: Diagnosis not present

## 2016-11-10 DIAGNOSIS — G2581 Restless legs syndrome: Secondary | ICD-10-CM | POA: Diagnosis not present

## 2016-11-10 DIAGNOSIS — E1142 Type 2 diabetes mellitus with diabetic polyneuropathy: Secondary | ICD-10-CM | POA: Diagnosis not present

## 2016-11-10 DIAGNOSIS — F339 Major depressive disorder, recurrent, unspecified: Secondary | ICD-10-CM | POA: Diagnosis not present

## 2016-11-10 DIAGNOSIS — R531 Weakness: Secondary | ICD-10-CM | POA: Diagnosis not present

## 2016-11-11 DIAGNOSIS — K219 Gastro-esophageal reflux disease without esophagitis: Secondary | ICD-10-CM | POA: Diagnosis not present

## 2016-11-11 DIAGNOSIS — F339 Major depressive disorder, recurrent, unspecified: Secondary | ICD-10-CM | POA: Diagnosis not present

## 2016-11-11 DIAGNOSIS — M1990 Unspecified osteoarthritis, unspecified site: Secondary | ICD-10-CM | POA: Diagnosis not present

## 2016-11-11 DIAGNOSIS — G2581 Restless legs syndrome: Secondary | ICD-10-CM | POA: Diagnosis not present

## 2016-11-11 DIAGNOSIS — E1142 Type 2 diabetes mellitus with diabetic polyneuropathy: Secondary | ICD-10-CM | POA: Diagnosis not present

## 2016-11-11 DIAGNOSIS — R531 Weakness: Secondary | ICD-10-CM | POA: Diagnosis not present

## 2016-11-12 DIAGNOSIS — M1990 Unspecified osteoarthritis, unspecified site: Secondary | ICD-10-CM | POA: Diagnosis not present

## 2016-11-12 DIAGNOSIS — F339 Major depressive disorder, recurrent, unspecified: Secondary | ICD-10-CM | POA: Diagnosis not present

## 2016-11-12 DIAGNOSIS — K219 Gastro-esophageal reflux disease without esophagitis: Secondary | ICD-10-CM | POA: Diagnosis not present

## 2016-11-12 DIAGNOSIS — G2581 Restless legs syndrome: Secondary | ICD-10-CM | POA: Diagnosis not present

## 2016-11-12 DIAGNOSIS — E1142 Type 2 diabetes mellitus with diabetic polyneuropathy: Secondary | ICD-10-CM | POA: Diagnosis not present

## 2016-11-12 DIAGNOSIS — R531 Weakness: Secondary | ICD-10-CM | POA: Diagnosis not present

## 2016-11-17 DIAGNOSIS — F339 Major depressive disorder, recurrent, unspecified: Secondary | ICD-10-CM | POA: Diagnosis not present

## 2016-11-17 DIAGNOSIS — E1142 Type 2 diabetes mellitus with diabetic polyneuropathy: Secondary | ICD-10-CM | POA: Diagnosis not present

## 2016-11-17 DIAGNOSIS — R531 Weakness: Secondary | ICD-10-CM | POA: Diagnosis not present

## 2016-11-17 DIAGNOSIS — G2581 Restless legs syndrome: Secondary | ICD-10-CM | POA: Diagnosis not present

## 2016-11-17 DIAGNOSIS — K219 Gastro-esophageal reflux disease without esophagitis: Secondary | ICD-10-CM | POA: Diagnosis not present

## 2016-11-17 DIAGNOSIS — M1990 Unspecified osteoarthritis, unspecified site: Secondary | ICD-10-CM | POA: Diagnosis not present

## 2016-11-20 DIAGNOSIS — M1990 Unspecified osteoarthritis, unspecified site: Secondary | ICD-10-CM | POA: Diagnosis not present

## 2016-11-20 DIAGNOSIS — G2581 Restless legs syndrome: Secondary | ICD-10-CM | POA: Diagnosis not present

## 2016-11-20 DIAGNOSIS — R531 Weakness: Secondary | ICD-10-CM | POA: Diagnosis not present

## 2016-11-20 DIAGNOSIS — F339 Major depressive disorder, recurrent, unspecified: Secondary | ICD-10-CM | POA: Diagnosis not present

## 2016-11-20 DIAGNOSIS — E1142 Type 2 diabetes mellitus with diabetic polyneuropathy: Secondary | ICD-10-CM | POA: Diagnosis not present

## 2016-11-20 DIAGNOSIS — K219 Gastro-esophageal reflux disease without esophagitis: Secondary | ICD-10-CM | POA: Diagnosis not present

## 2016-11-21 DIAGNOSIS — G2581 Restless legs syndrome: Secondary | ICD-10-CM | POA: Diagnosis not present

## 2016-11-21 DIAGNOSIS — F339 Major depressive disorder, recurrent, unspecified: Secondary | ICD-10-CM | POA: Diagnosis not present

## 2016-11-21 DIAGNOSIS — K219 Gastro-esophageal reflux disease without esophagitis: Secondary | ICD-10-CM | POA: Diagnosis not present

## 2016-11-21 DIAGNOSIS — M1990 Unspecified osteoarthritis, unspecified site: Secondary | ICD-10-CM | POA: Diagnosis not present

## 2016-11-21 DIAGNOSIS — E1142 Type 2 diabetes mellitus with diabetic polyneuropathy: Secondary | ICD-10-CM | POA: Diagnosis not present

## 2016-11-21 DIAGNOSIS — R531 Weakness: Secondary | ICD-10-CM | POA: Diagnosis not present

## 2016-11-24 DIAGNOSIS — E1142 Type 2 diabetes mellitus with diabetic polyneuropathy: Secondary | ICD-10-CM | POA: Diagnosis not present

## 2016-11-24 DIAGNOSIS — K219 Gastro-esophageal reflux disease without esophagitis: Secondary | ICD-10-CM | POA: Diagnosis not present

## 2016-11-24 DIAGNOSIS — M1990 Unspecified osteoarthritis, unspecified site: Secondary | ICD-10-CM | POA: Diagnosis not present

## 2016-11-24 DIAGNOSIS — G2581 Restless legs syndrome: Secondary | ICD-10-CM | POA: Diagnosis not present

## 2016-11-24 DIAGNOSIS — F339 Major depressive disorder, recurrent, unspecified: Secondary | ICD-10-CM | POA: Diagnosis not present

## 2016-11-24 DIAGNOSIS — R531 Weakness: Secondary | ICD-10-CM | POA: Diagnosis not present

## 2016-11-26 DIAGNOSIS — F339 Major depressive disorder, recurrent, unspecified: Secondary | ICD-10-CM | POA: Diagnosis not present

## 2016-11-26 DIAGNOSIS — M1990 Unspecified osteoarthritis, unspecified site: Secondary | ICD-10-CM | POA: Diagnosis not present

## 2016-11-26 DIAGNOSIS — E1142 Type 2 diabetes mellitus with diabetic polyneuropathy: Secondary | ICD-10-CM | POA: Diagnosis not present

## 2016-11-26 DIAGNOSIS — G2581 Restless legs syndrome: Secondary | ICD-10-CM | POA: Diagnosis not present

## 2016-11-26 DIAGNOSIS — R531 Weakness: Secondary | ICD-10-CM | POA: Diagnosis not present

## 2016-11-26 DIAGNOSIS — K219 Gastro-esophageal reflux disease without esophagitis: Secondary | ICD-10-CM | POA: Diagnosis not present

## 2016-12-04 DIAGNOSIS — N183 Chronic kidney disease, stage 3 (moderate): Secondary | ICD-10-CM | POA: Diagnosis not present

## 2016-12-04 DIAGNOSIS — I10 Essential (primary) hypertension: Secondary | ICD-10-CM | POA: Diagnosis not present

## 2016-12-18 ENCOUNTER — Emergency Department (HOSPITAL_COMMUNITY): Payer: Medicare Other

## 2016-12-18 ENCOUNTER — Encounter (HOSPITAL_COMMUNITY): Payer: Self-pay | Admitting: Emergency Medicine

## 2016-12-18 ENCOUNTER — Emergency Department (HOSPITAL_COMMUNITY)
Admission: EM | Admit: 2016-12-18 | Discharge: 2016-12-18 | Disposition: A | Discharge status: Home / Self Care | Payer: Medicare Other | Attending: Emergency Medicine | Admitting: Emergency Medicine

## 2016-12-18 DIAGNOSIS — Y999 Unspecified external cause status: Secondary | ICD-10-CM | POA: Insufficient documentation

## 2016-12-18 DIAGNOSIS — E039 Hypothyroidism, unspecified: Secondary | ICD-10-CM | POA: Diagnosis not present

## 2016-12-18 DIAGNOSIS — Y939 Activity, unspecified: Secondary | ICD-10-CM | POA: Insufficient documentation

## 2016-12-18 DIAGNOSIS — S01412A Laceration without foreign body of left cheek and temporomandibular area, initial encounter: Secondary | ICD-10-CM

## 2016-12-18 DIAGNOSIS — W06XXXA Fall from bed, initial encounter: Secondary | ICD-10-CM | POA: Diagnosis not present

## 2016-12-18 DIAGNOSIS — E119 Type 2 diabetes mellitus without complications: Secondary | ICD-10-CM | POA: Insufficient documentation

## 2016-12-18 DIAGNOSIS — I10 Essential (primary) hypertension: Secondary | ICD-10-CM | POA: Insufficient documentation

## 2016-12-18 DIAGNOSIS — S0181XA Laceration without foreign body of other part of head, initial encounter: Secondary | ICD-10-CM | POA: Diagnosis not present

## 2016-12-18 DIAGNOSIS — Y929 Unspecified place or not applicable: Secondary | ICD-10-CM | POA: Diagnosis not present

## 2016-12-18 DIAGNOSIS — R930 Abnormal findings on diagnostic imaging of skull and head, not elsewhere classified: Secondary | ICD-10-CM | POA: Diagnosis not present

## 2016-12-18 DIAGNOSIS — S0990XA Unspecified injury of head, initial encounter: Secondary | ICD-10-CM | POA: Diagnosis not present

## 2016-12-18 DIAGNOSIS — Z7901 Long term (current) use of anticoagulants: Secondary | ICD-10-CM | POA: Insufficient documentation

## 2016-12-18 NOTE — ED Triage Notes (Signed)
Patient comes from home via EMS for falling out of bed while sleeping.  Patient has facial laceration to left side.  Patient is on Eliquis. Patient lives alone and A&Ox4. Vitals: 140/90, CBG 129

## 2016-12-18 NOTE — ED Provider Notes (Signed)
[facility] DEPT Provider Note   CSN: [id number] Arrival date & time: 12/18/16  1806     History   Chief Complaint Chief Complaint  Patient presents with  . Fall  . Facial Laceration    HPI [name] is a 83 y.o. female.  The history is provided by the patient.  Laceration   The incident occurred less than 1 hour ago. The laceration is located on the face. The laceration is 1 cm in size. Injury mechanism: nightstand. The pain is mild. The pain has been constant since onset. She reports no foreign bodies present. Her tetanus status is UTD.    Past Medical History:  Diagnosis Date  . Acute gastritis without mention of hemorrhage   . Anemia   . Anxiety   . Arthritis    "LLL; palm of right hand" (06/12/2015)  . Atrial fibrillation ([place])   . CAP (community acquired pneumonia) 04/03/2014  . Crohn disease ([place])   . Cyst and pseudocyst of pancreas   . Depression   . Diaphragmatic hernia without mention of obstruction or gangrene   . Diverticulosis of colon (without mention of hemorrhage)   . Duodenitis without mention of hemorrhage   . DVT (deep venous thrombosis) ([place])    "twice on the left leg; once on the right leg" (06/12/2015)  . Dysrhythmia   . Esophageal reflux   . Hiatal hernia- moderate to large 10/06/2014  . Hx pulmonary embolism   . Hypothyroidism   . Insomnia, unspecified   . Left leg DVT ([place]) jan 2013  . Migraine    hx  . OAB (overactive bladder)   . Other and unspecified hyperlipidemia   . Other malaise and fatigue   . Pancreas divisum 03/02/2015  . Pancreatic cyst   . Personal history of unspecified digestive disease   . Polymyalgia rheumatica ([place])   . Protein-calorie malnutrition, severe ([place]) 10/02/2014  . Restless legs syndrome (RLS)   . Shingles   . Stricture and stenosis of esophagus   . Type II diabetes mellitus ([place])   . Unspecified essential hypertension   . Unspecified sleep apnea    hx of sleep apnea, none since weight loss  (06/12/2015)  . Urinary frequency   . Urinary urgency     Patient Active Problem List   Diagnosis Date Noted  . AKI (acute kidney injury) ([place]) 12/04/2015  . Rectal mass   . Pancreas divisum 03/02/2015  . Physical deconditioning 03/01/2015  . Pancreatic cyst   . Hiatal hernia- moderate to large 10/06/2014  . Heme + stool 10/04/2014  . Crohn's ileocolitis ([place]) 10/04/2014  . 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 Arthritis   . Hx pulmonary embolism   . Hypothyroidism 04/11/2014  . Type 2 diabetes mellitus ([place]) 04/11/2014  . Anemia of chronic disease 04/03/2014  . Overactive bladder 01/07/2012  . DVT of leg (deep venous thrombosis) ([place]) 01/07/2012  . RESTLESS LEG SYNDROME 03/09/2008  . Essential hypertension 03/09/2008  . ESOPHAGEAL STRICTURE 03/09/2008  . Diverticulosis of large intestine 03/09/2008  . Polymyalgia rheumatica ([place]) 03/09/2008  . INSOMNIA 03/09/2008  . SLEEP APNEA 03/09/2008    Past Surgical History:  Procedure Laterality Date  . BACK SURGERY    . CATARACT EXTRACTION, BILATERAL Bilateral   . CLOSED REDUCTION NASAL FRACTURE  08/2008   Archie Endo 04/10/2011  . COLON SURGERY  June 17, 2012   surgery for bleed  . COLONOSCOPY N/A 06/13/2015   Procedure: COLONOSCOPY;  Surgeon: Irene Shipper, MD;  [facility]  END[facility]COPY;  Service: Endoscopy;  Laterality: N/A;  . OOPHORECTOMY  1944   not sure which ovary  . POSTERIOR LUMBAR FUSION  01/2009   L4-L5 Archie Endo 04/09/2011  . REDUCTION MAMMAPLASTY Bilateral 1980  . ROTATOR CUFF REPAIR Right   . SHOULDER OPEN ROTATOR CUFF REPAIR  08/04/2012   Procedure: ROTATOR CUFF REPAIR SHOULDER OPEN;  Surgeon: Tobi Bastos, MD;  Location: [facility];  Service: Orthopedics;  Laterality: Left;  with Anchors and Graft  . SHOULDER OPEN ROTATOR CUFF REPAIR Right 2006   Archie Endo 04/22/2011  . TONSILLECTOMY      OB History    No data available       Home Medications    Prior to Admission medications   Medication Sig Start Date End Date Taking?  Authorizing Provider  acetaminophen (TYLENOL) 500 MG tablet Take 500 mg by mouth every 6 (six) hours as needed for mild pain or moderate pain.    Historical Provider, MD  amitriptyline (ELAVIL) 10 MG tablet Take 10 mg by mouth at bedtime. 08/01/16   Historical Provider, MD  apixaban (ELIQUIS) 2.5 MG TABS tablet Take 1 tablet (2.5 mg total) by mouth 2 (two) times daily. 10/09/14   Belkys A Regalado, MD  diclofenac sodium (VOLTAREN) 1 % GEL Apply 2 g topically every morning.    Historical Provider, MD  feeding supplement, GLUCERNA SHAKE, (GLUCERNA SHAKE) LIQD Take 237 mLs by mouth 4 (four) times daily - after meals and at bedtime. Patient taking differently: Take 237 mLs by mouth 2 (two) times daily as needed (nutritional supplement).  10/09/14   Belkys A Regalado, MD  gabapentin (NEURONTIN) 100 MG capsule Take 100 mg by mouth 3 (three) times daily.     Historical Provider, MD  hyoscyamine (LEVSIN SL) 0.125 MG SL tablet Place 1 tablet (0.125 mg total) under the tongue every 6 (six) hours as needed for cramping. Patient not taking: Reported on 09/12/2016 03/03/15   Venetia Maxon Rama, MD  levothyroxine (SYNTHROID, LEVOTHROID) 75 MCG tablet Take 75 mcg by mouth daily before breakfast.    Historical Provider, MD  metFORMIN (GLUCOPHAGE) 500 MG tablet Take 1 tablet (500 mg total) by mouth 2 (two) times daily with a meal. 03/03/15   Venetia Maxon Rama, MD  mirtazapine (REMERON) 15 MG tablet Take 15 mg by mouth at bedtime.    Historical Provider, MD  Multiple Vitamin (MULTIVITAMIN WITH MINERALS) TABS tablet Take 1 tablet by mouth daily.    Historical Provider, MD  ondansetron (ZOFRAN) 4 MG tablet Take 1 tablet (4 mg total) by mouth every 8 (eight) hours as needed for nausea or vomiting. Patient not taking: Reported on 09/12/2016 12/11/15   Amy S Esterwood, PA-C  oxyCODONE (OXY IR/ROXICODONE) 5 MG immediate release tablet Take 2 tabs= 10 mg by mouth every 6 hours as needed for nausea. Patient not taking: Reported on  09/12/2016 03/06/15   Estill Dooms, MD  pantoprazole (PROTONIX) 40 MG tablet Take 40 mg by mouth daily.    Historical Provider, MD  polyethylene glycol (MIRALAX / GLYCOLAX) packet Take 17 g by mouth daily. Patient not taking: Reported on 09/12/2016 03/03/15   Venetia Maxon Rama, MD  ropinirole (REQUIP) 5 MG tablet Take 5 mg by mouth at bedtime.    Historical Provider, MD    Family History Family History  Problem Relation Age of Onset  . Melanoma Daughter     died age 17  . Melanoma Son     Social History Social History  Substance  Use Topics  . Smoking status: Never Smoker  . Smokeless tobacco: Never Used  . Alcohol use No     Allergies   Sulfa antibiotics and Penicillins   Review of Systems Review of Systems  All other systems reviewed and are negative.    Physical Exam Updated Vital Signs There were no vitals taken for this visit.  Physical Exam  Constitutional: She is oriented to person, place, and time. She appears well-developed and well-nourished. No distress.  HENT:  Head: Normocephalic. Head is with laceration.    Nose: Nose normal.  Eyes: Conjunctivae are normal.  Neck: Neck supple. No tracheal deviation present.  Cardiovascular: Normal rate, regular rhythm and normal heart sounds.   Pulmonary/Chest: Effort normal and breath sounds normal. No respiratory distress.  Abdominal: Soft. She exhibits no distension.  Musculoskeletal: Normal range of motion. She exhibits no tenderness or deformity.  Neurological: She is alert and oriented to person, place, and time. No cranial nerve deficit. Coordination normal.  Skin: Skin is warm and dry.  Psychiatric: She has a normal mood and affect.  Vitals reviewed.    ED Treatments / Results  Labs (all labs ordered are listed, but only abnormal results are displayed) Labs Reviewed - No data to display  EKG  EKG Interpretation None       Radiology Ct Head Wo Contrast  Result Date: 12/18/2016 CLINICAL DATA:   Status post fall out of bed, with left-sided facial laceration. Initial encounter. EXAM: CT HEAD WITHOUT CONTRAST TECHNIQUE: Contiguous axial images were obtained from the base of the skull through the vertex without intravenous contrast. COMPARISON:  CT of the head performed 12/05/2012 FINDINGS: Brain: No evidence of acute infarction, hemorrhage, hydrocephalus, extra-axial collection or mass lesion/mass effect. Prominence of the ventricles and sulci reflects mild to moderate cortical volume loss. Cerebellar atrophy is noted. Scattered periventricular and subcortical white matter change likely reflects small vessel ischemic microangiopathy. The brainstem and fourth ventricle are within normal limits. The basal ganglia are unremarkable in appearance. The cerebral hemispheres demonstrate grossly normal gray-white differentiation. No mass effect or midline shift is seen. Vascular: No hyperdense vessel or unexpected calcification. Skull: There is no evidence of fracture; visualized osseous structures are unremarkable in appearance. Sinuses/Orbits: The visualized portions of the orbits are within normal limits. The paranasal sinuses and mastoid air cells are well-aerated. Other: No significant soft tissue abnormalities are seen. IMPRESSION: 1. No evidence of traumatic intracranial injury or fracture. 2. Mild to moderate cortical volume loss and scattered small vessel ischemic microangiopathy. Electronically Signed   By: Garald Balding M.D.   On: 12/18/2016 19:27    Procedures Procedures (including critical care time)  Medications Ordered in ED Medications - No data to display   Initial Impression / Assessment and Plan / ED Course  I have reviewed the triage vital signs and the nursing notes.  Pertinent labs & imaging results that were available during my care of the patient were reviewed by me and considered in my medical decision making (see chart for details).  Clinical Course     83 y.o. female  presents with fall from her bed striking left face on nightstand sustaining laceration. Anticoagulated on eliquis. No signs of other injury and no pain elsewhere. Lac repaired with dermabond, no bleed evident on CT. Plan to follow up with PCP as needed and return precautions discussed for worsening or new concerning symptoms.   Final Clinical Impressions(s) / ED Diagnoses   Final diagnoses:  Cheek laceration, left, initial encounter  New Prescriptions Discharge Medication List as of 12/18/2016  7:47 PM       Leo Grosser, MD 12/19/16 [phone]

## 2016-12-30 DIAGNOSIS — D472 Monoclonal gammopathy: Secondary | ICD-10-CM | POA: Diagnosis not present

## 2016-12-30 DIAGNOSIS — E1140 Type 2 diabetes mellitus with diabetic neuropathy, unspecified: Secondary | ICD-10-CM | POA: Diagnosis not present

## 2016-12-30 DIAGNOSIS — G629 Polyneuropathy, unspecified: Secondary | ICD-10-CM | POA: Diagnosis not present

## 2016-12-30 DIAGNOSIS — N183 Chronic kidney disease, stage 3 (moderate): Secondary | ICD-10-CM | POA: Diagnosis not present

## 2016-12-30 DIAGNOSIS — G8929 Other chronic pain: Secondary | ICD-10-CM | POA: Diagnosis not present

## 2016-12-30 DIAGNOSIS — F329 Major depressive disorder, single episode, unspecified: Secondary | ICD-10-CM | POA: Diagnosis not present

## 2016-12-30 DIAGNOSIS — E538 Deficiency of other specified B group vitamins: Secondary | ICD-10-CM | POA: Diagnosis not present

## 2016-12-30 DIAGNOSIS — I10 Essential (primary) hypertension: Secondary | ICD-10-CM | POA: Diagnosis not present

## 2016-12-30 DIAGNOSIS — K219 Gastro-esophageal reflux disease without esophagitis: Secondary | ICD-10-CM | POA: Diagnosis not present

## 2016-12-30 DIAGNOSIS — E039 Hypothyroidism, unspecified: Secondary | ICD-10-CM | POA: Diagnosis not present

## 2016-12-30 DIAGNOSIS — G2581 Restless legs syndrome: Secondary | ICD-10-CM | POA: Diagnosis not present

## 2017-03-27 DIAGNOSIS — G4700 Insomnia, unspecified: Secondary | ICD-10-CM | POA: Diagnosis not present

## 2017-03-27 DIAGNOSIS — R112 Nausea with vomiting, unspecified: Secondary | ICD-10-CM | POA: Diagnosis not present

## 2017-03-27 DIAGNOSIS — R531 Weakness: Secondary | ICD-10-CM | POA: Diagnosis not present

## 2017-03-27 DIAGNOSIS — E034 Atrophy of thyroid (acquired): Secondary | ICD-10-CM | POA: Diagnosis not present

## 2017-03-27 DIAGNOSIS — R600 Localized edema: Secondary | ICD-10-CM | POA: Diagnosis not present

## 2017-03-27 DIAGNOSIS — E1140 Type 2 diabetes mellitus with diabetic neuropathy, unspecified: Secondary | ICD-10-CM | POA: Diagnosis not present

## 2017-04-08 DIAGNOSIS — I82502 Chronic embolism and thrombosis of unspecified deep veins of left lower extremity: Secondary | ICD-10-CM | POA: Insufficient documentation

## 2017-04-08 DIAGNOSIS — K5909 Other constipation: Secondary | ICD-10-CM | POA: Insufficient documentation

## 2017-04-08 DIAGNOSIS — E538 Deficiency of other specified B group vitamins: Secondary | ICD-10-CM | POA: Insufficient documentation

## 2017-04-08 DIAGNOSIS — D472 Monoclonal gammopathy: Secondary | ICD-10-CM | POA: Insufficient documentation

## 2017-04-08 DIAGNOSIS — G629 Polyneuropathy, unspecified: Secondary | ICD-10-CM | POA: Insufficient documentation

## 2017-04-08 DIAGNOSIS — F339 Major depressive disorder, recurrent, unspecified: Secondary | ICD-10-CM | POA: Insufficient documentation

## 2017-04-19 DIAGNOSIS — N184 Chronic kidney disease, stage 4 (severe): Secondary | ICD-10-CM | POA: Insufficient documentation

## 2017-04-19 DIAGNOSIS — D509 Iron deficiency anemia, unspecified: Secondary | ICD-10-CM | POA: Insufficient documentation

## 2017-04-19 DIAGNOSIS — E1122 Type 2 diabetes mellitus with diabetic chronic kidney disease: Secondary | ICD-10-CM | POA: Insufficient documentation

## 2017-04-24 DIAGNOSIS — R531 Weakness: Secondary | ICD-10-CM | POA: Diagnosis not present

## 2017-07-31 DIAGNOSIS — M1990 Unspecified osteoarthritis, unspecified site: Secondary | ICD-10-CM | POA: Insufficient documentation

## 2017-09-21 ENCOUNTER — Encounter: Payer: Self-pay | Admitting: Family Medicine

## 2017-09-21 ENCOUNTER — Ambulatory Visit (INDEPENDENT_AMBULATORY_CARE_PROVIDER_SITE_OTHER): Payer: Medicare Other | Admitting: Family Medicine

## 2017-09-21 VITALS — BP 124/78 | HR 85 | Temp 98.4°F | Ht 62.0 in | Wt 120.6 lb

## 2017-09-21 DIAGNOSIS — N184 Chronic kidney disease, stage 4 (severe): Secondary | ICD-10-CM | POA: Diagnosis not present

## 2017-09-21 DIAGNOSIS — G2581 Restless legs syndrome: Secondary | ICD-10-CM | POA: Diagnosis not present

## 2017-09-21 DIAGNOSIS — N183 Chronic kidney disease, stage 3 (moderate): Secondary | ICD-10-CM

## 2017-09-21 DIAGNOSIS — E039 Hypothyroidism, unspecified: Secondary | ICD-10-CM | POA: Diagnosis not present

## 2017-09-21 DIAGNOSIS — R5381 Other malaise: Secondary | ICD-10-CM

## 2017-09-21 DIAGNOSIS — Z0000 Encounter for general adult medical examination without abnormal findings: Secondary | ICD-10-CM | POA: Diagnosis not present

## 2017-09-21 DIAGNOSIS — I10 Essential (primary) hypertension: Secondary | ICD-10-CM

## 2017-09-21 DIAGNOSIS — L853 Xerosis cutis: Secondary | ICD-10-CM | POA: Diagnosis not present

## 2017-09-21 DIAGNOSIS — E1122 Type 2 diabetes mellitus with diabetic chronic kidney disease: Secondary | ICD-10-CM | POA: Diagnosis not present

## 2017-09-21 MED ORDER — TRIAMCINOLONE ACETONIDE 0.5 % EX OINT: 1.0000 "application " | TOPICAL_OINTMENT | Freq: Two times a day (BID) | CUTANEOUS | 0 refills | Status: DC

## 2017-09-21 NOTE — Progress Notes (Signed)
[name] is a 83 y.o. female is here to [place].   Patient Care Team: Briscoe Deutscher, DO as PCP - General (Family Medicine) Gaye Pollack, MD as Consulting Physician (Cardiothoracic Surgery)   History of Present Illness:   Gertie Exon, CMA, acting as scribe for Dr. Juleen China.  HPI:  Patient comes in today to establish care.  She is accompanied by a "lifelong friend" whom she has just begun paying to come to her home and help care for her.  Patient states she has weakness all over.  She walks with a cane.  She states that in the morning from the knees down she either "feels like her legs are stumps or she feels like there are bugs in her legs."  Patient states she "does better at night".  She has a rash on her left shoulder that she continues to scratch during her visit.  This could be related to thyroid, but will prescribe ointment today in addition to checking thyroid labs.  She has a bag full of medications.  She states she is unsure what she is taking.  She states she had someone assisting her with her medications but found out that person was stealing from her.  She has two different doses of ropinerole with her today.    According to previous provider notes, patient should no longer be taking Januvia.  I have advised patient to stop this medication.  Patient should be taking levothyroxine 88 mcg daily.  She does not have this with her and states she has brought all her medications with her.  Will rx this today and recheck thyroid labs.    Patient states she does not want extraordinary measures taken if she was to go into cardiac arrest.    Will have patient schedule with Cassie for AWV.  Patient has a Oceanographer.  Health Maintenance Due  Topic Date Due  . FOOT EXAM  07/24/1931  . OPHTHALMOLOGY EXAM  07/24/1931  . URINE MICROALBUMIN  07/24/1931  . TETANUS/TDAP  07/23/1940  . DEXA SCAN  07/23/1986  . PNA vac Low Risk Adult (1 of 2 - PCV13) 07/23/1986     No flowsheet data found. PMHx, SurgHx, SocialHx, Medications, and Allergies were reviewed in the Visit Navigator and updated as appropriate.   Past Medical History:  Diagnosis Date  . Acute gastritis without mention of hemorrhage   . Anemia   . Anxiety   . Arthritis    "LLL; palm of right hand" (06/12/2015)  . Atrial fibrillation ([place])   . CAP (community acquired pneumonia) 04/03/2014  . Crohn disease ([place])   . Cyst and pseudocyst of pancreas   . Depression   . Diaphragmatic hernia without mention of obstruction or gangrene   . Diverticulosis of colon (without mention of hemorrhage)   . Duodenitis without mention of hemorrhage   . DVT (deep venous thrombosis) ([place])    "twice on the left leg; once on the right leg" (06/12/2015)  . Dysrhythmia   . Esophageal reflux   . Hiatal hernia- moderate to large 10/06/2014  . Hx pulmonary embolism   . Hypothyroidism   . Insomnia, unspecified   . Left leg DVT ([place]) jan 2013  . Migraine    hx  . OAB (overactive bladder)   . Other and unspecified hyperlipidemia   . Other malaise and fatigue   . Pancreas divisum 03/02/2015  . Pancreatic cyst   . Personal history of unspecified digestive disease   .  Polymyalgia rheumatica ([place])   . Protein-calorie malnutrition, severe ([place]) 10/02/2014  . Restless legs syndrome (RLS)   . Shingles   . Stricture and stenosis of esophagus   . Type II diabetes mellitus ([place])   . Unspecified essential hypertension   . Unspecified sleep apnea    hx of sleep apnea, none since weight loss (06/12/2015)  . Urinary frequency   . Urinary urgency     Past Surgical History:  Procedure Laterality Date  . BACK SURGERY    . CATARACT EXTRACTION, BILATERAL Bilateral   . CLOSED REDUCTION NASAL FRACTURE  08/2008   Archie Endo 04/10/2011  . COLON SURGERY  June 17, 2012   surgery for bleed  . COLONOSCOPY N/A 06/13/2015   Procedure: COLONOSCOPY;  Surgeon: Irene Shipper, MD;  Location: [facility];  Service: Endoscopy;  Laterality:  N/A;  . OOPHORECTOMY  1944   not sure which ovary  . POSTERIOR LUMBAR FUSION  01/2009   L4-L5 Archie Endo 04/09/2011  . REDUCTION MAMMAPLASTY Bilateral 1980  . ROTATOR CUFF REPAIR Right   . SHOULDER OPEN ROTATOR CUFF REPAIR  08/04/2012   Procedure: ROTATOR CUFF REPAIR SHOULDER OPEN;  Surgeon: Tobi Bastos, MD;  Location: [facility];  Service: Orthopedics;  Laterality: Left;  with Anchors and Graft  . SHOULDER OPEN ROTATOR CUFF REPAIR Right 2006   Archie Endo 04/22/2011  . TONSILLECTOMY      Family History  Problem Relation Age of Onset  . Melanoma Daughter        died age 18  . Melanoma Son    Social History  Substance Use Topics  . Smoking status: Never Smoker  . Smokeless tobacco: Never Used  . Alcohol use No    Current Medications and Allergies:   Current Outpatient Prescriptions:  .  apixaban (ELIQUIS) 2.5 MG TABS tablet, Take 1 tablet (2.5 mg total) by mouth 2 (two) times daily., Disp: 60 tablet, Rfl: 0 .  furosemide (LASIX) 20 MG tablet, Take 20 mg by mouth daily., Disp: , Rfl:  .  gabapentin (NEURONTIN) 100 MG capsule, Take 100 mg by mouth 3 (three) times daily. , Disp: , Rfl:  .  pantoprazole (PROTONIX) 40 MG tablet, Take 40 mg by mouth daily., Disp: , Rfl:  .  ropinirole (REQUIP) 5 MG tablet, Take 5 mg by mouth at bedtime., Disp: , Rfl:  .  triamcinolone ointment (KENALOG) 0.5 %, Apply 1 application topically 2 (two) times daily., Disp: 30 g, Rfl: 0   Allergies  Allergen Reactions  . Sulfa Antibiotics Nausea And Vomiting  . Penicillins Hives and Rash    Has patient had a PCN reaction causing immediate rash, facial/tongue/throat swelling, SOB or lightheadedness with hypotension: Unknown Has patient had a PCN reaction causing severe rash involving mucus membranes or skin necrosis: Unknown Has patient had a PCN reaction that required hospitalization Unknown Has patient had a PCN reaction occurring within the last 10 years: No If all of the above answers are "NO", then may proceed  with Cephalosporin use.    Review of Systems:   Pertinent items are noted in the HPI. Otherwise, ROS is negative.  Vitals:   Vitals:   09/21/17 1417  BP: 124/78  Pulse: 85  Temp: 98.4 F (36.9 C)  TempSrc: Oral  SpO2: 95%  Weight: 120 lb 9.6 oz (54.7 kg)  Height: 5' 2"  (1.575 m)     Body mass index is 22.06 kg/m.   Physical Exam:   Physical Exam  Constitutional: She appears well-nourished. No  distress.  Hard of hearing. Using assist device.  HENT:  Head: Normocephalic and atraumatic.  Eyes: Pupils are equal, round, and reactive to light. EOM are normal.  Neck: Normal range of motion. Neck supple.  Cardiovascular: Normal rate, regular rhythm and intact distal pulses.   Pulmonary/Chest: Effort normal.  Abdominal: Soft.  Neurological: She is alert.  Skin: Skin is warm.  Dry, excoriated skin on left shoulder.  Psychiatric: She has a normal mood and affect. Her behavior is normal.  Nursing note and vitals reviewed.  Assessment and Plan:   Christiane was seen today for establish care.  Diagnoses and all orders for this visit:  Hypothyroidism, unspecified type Comments: Patient with Hx of Hypothyroidism, previously on 88 mcg daily, off for unknown period of time. Will start low and recheck. Orders: -     TSH -     T4, free  Type 2 diabetes mellitus with stage 3 chronic kidney disease, unspecified whether long term insulin use ([place]) Comments:  Lab Results  Component Value Date   HGBA1C 6.9 (H) 09/21/2017   Orders: -     Hemoglobin A1c  Benign essential hypertension Comments:  BP Readings from Last 3 Encounters:  09/21/17 124/78  12/18/16 126/57  09/12/16 141/66   CKD stage 4 due to type 2 diabetes mellitus ([place]) Comments:  Lab Results  Component Value Date   CREATININE 1.42 (H) 09/21/2017   CREATININE 1.60 (H) 09/12/2016   CREATININE 1.04 (H) 12/08/2015   Orders: -     CBC with Differential/Platelet -     Comprehensive metabolic panel -      Magnesium -     Phosphorus  Physical deconditioning Comments: Will offer PT.  Restless leg syndrome Comments: Continue Requip q hs.   Dry skin dermatitis -     triamcinolone ointment (KENALOG) 0.5 %; Apply 1 application topically 2 (two) times daily.   . Reviewed expectations re: course of current medical issues. . Discussed self-management of symptoms. . Outlined signs and symptoms indicating need for more acute intervention. . Patient verbalized understanding and all questions were answered. Marland Kitchen Health Maintenance issues including appropriate healthy diet, exercise, and smoking avoidance were discussed with patient. . See orders for this visit as documented in the electronic medical record. . Patient received an After Visit Summary.  CMA served as Education administrator during this visit. History, Physical, and Plan performed by medical provider. The above documentation has been reviewed and is accurate and complete. Briscoe Deutscher, D.O.  Briscoe Deutscher, DO [place], [place] 09/24/2017  Future Appointments Date Time Provider [place]  10/26/2017 1:00 PM Williemae Area, RN [facility] None  10/26/2017 2:00 PM Briscoe Deutscher, DO [facility] None

## 2017-09-21 NOTE — Patient Instructions (Addendum)
Schedule appointment with [name] for Annual Wellness Visit.  Stop taking Januvia.

## 2017-09-22 LAB — CBC WITH DIFFERENTIAL/PLATELET
Basophils Absolute: 0.1 10*3/uL (ref 0.0–0.1)
Basophils Relative: 0.9 % (ref 0.0–3.0)
Eosinophils Absolute: 0.2 10*3/uL (ref 0.0–0.7)
Eosinophils Relative: 3.4 % (ref 0.0–5.0)
HCT: 36.9 % (ref 36.0–46.0)
Hemoglobin: 12 g/dL (ref 12.0–15.0)
Lymphocytes Relative: 36 % (ref 12.0–46.0)
Lymphs Abs: 2.2 10*3/uL (ref 0.7–4.0)
MCHC: 32.6 g/dL (ref 30.0–36.0)
MCV: 92.8 fl (ref 78.0–100.0)
Monocytes Absolute: 0.5 10*3/uL (ref 0.1–1.0)
Monocytes Relative: 8 % (ref 3.0–12.0)
Neutro Abs: 3.2 10*3/uL (ref 1.4–7.7)
Neutrophils Relative %: 51.7 % (ref 43.0–77.0)
Platelets: 246 10*3/uL (ref 150.0–400.0)
RBC: 3.98 Mil/uL (ref 3.87–5.11)
RDW: 15 % (ref 11.5–15.5)
WBC: 6.2 10*3/uL (ref 4.0–10.5)

## 2017-09-22 LAB — COMPREHENSIVE METABOLIC PANEL
ALT: 10 U/L (ref 0–35)
AST: 18 U/L (ref 0–37)
Albumin: 3.9 g/dL (ref 3.5–5.2)
Alkaline Phosphatase: 107 U/L (ref 39–117)
BUN: 30 mg/dL — ABNORMAL HIGH (ref 6–23)
CO2: 30 mEq/L (ref 19–32)
Calcium: 9.6 mg/dL (ref 8.4–10.5)
Chloride: 104 mEq/L (ref 96–112)
Creatinine, Ser: 1.42 mg/dL — ABNORMAL HIGH (ref 0.40–1.20)
GFR: 36.44 mL/min — ABNORMAL LOW (ref >60.00)
Glucose, Bld: 155 mg/dL — ABNORMAL HIGH (ref 70–99)
Potassium: 5.5 mEq/L — ABNORMAL HIGH (ref 3.5–5.1)
Sodium: 146 mEq/L — ABNORMAL HIGH (ref 135–145)
Total Bilirubin: 0.4 mg/dL (ref 0.2–1.2)
Total Protein: 6.5 g/dL (ref 6.0–8.3)

## 2017-09-22 LAB — T4, FREE: Free T4: 1.06 ng/dL (ref 0.60–1.60)

## 2017-09-22 LAB — HEMOGLOBIN A1C: Hgb A1c MFr Bld: 6.9 % — ABNORMAL HIGH (ref 4.6–6.5)

## 2017-09-22 LAB — TSH: TSH: 4.05 u[IU]/mL (ref 0.35–4.50)

## 2017-09-22 LAB — PHOSPHORUS: Phosphorus: 3.2 mg/dL (ref 2.3–4.6)

## 2017-09-22 LAB — MAGNESIUM: Magnesium: 1.8 mg/dL (ref 1.5–2.5)

## 2017-09-24 DIAGNOSIS — L853 Xerosis cutis: Secondary | ICD-10-CM | POA: Insufficient documentation

## 2017-10-09 ENCOUNTER — Ambulatory Visit: Payer: Medicare Other | Admitting: Family Medicine

## 2017-10-19 NOTE — Progress Notes (Deleted)
Subjective:   [name] is a 83 y.o. female who presents for Medicare Annual/Subsequent preventive examination.  Review of Systems:  No ROS.  Medicare Wellness Visit. Additional risk factors are reflected in the social history.        Objective:    Vitals: There were no vitals taken for this visit.  There is no height or weight on file to calculate BMI.  Tobacco Social History   Tobacco Use  Smoking Status Never Smoker  Smokeless Tobacco Never Used     Counseling given: Not Answered   Past Medical History:  Diagnosis Date  . Acute gastritis without mention of hemorrhage   . Anemia   . Anxiety   . Arthritis    "LLL; palm of right hand" (06/12/2015)  . Atrial fibrillation ([place])   . CAP (community acquired pneumonia) 04/03/2014  . Crohn disease ([place])   . Cyst and pseudocyst of pancreas   . Depression   . Diaphragmatic hernia without mention of obstruction or gangrene   . Diverticulosis of colon (without mention of hemorrhage)   . Duodenitis without mention of hemorrhage   . DVT (deep venous thrombosis) ([place])    "twice on the left leg; once on the right leg" (06/12/2015)  . Dysrhythmia   . Esophageal reflux   . Hiatal hernia- moderate to large 10/06/2014  . Hx pulmonary embolism   . Hypothyroidism   . Insomnia, unspecified   . Left leg DVT ([place]) jan 2013  . Migraine    hx  . OAB (overactive bladder)   . Other and unspecified hyperlipidemia   . Other malaise and fatigue   . Pancreas divisum 03/02/2015  . Pancreatic cyst   . Personal history of unspecified digestive disease   . Polymyalgia rheumatica ([place])   . Protein-calorie malnutrition, severe ([place]) 10/02/2014  . Restless legs syndrome (RLS)   . Shingles   . Stricture and stenosis of esophagus   . Type II diabetes mellitus ([place])   . Unspecified essential hypertension   . Unspecified sleep apnea    hx of sleep apnea, none since weight loss (06/12/2015)  . Urinary frequency   . Urinary urgency    Past  Surgical History:  Procedure Laterality Date  . BACK SURGERY    . CATARACT EXTRACTION, BILATERAL Bilateral   . CLOSED REDUCTION NASAL FRACTURE  08/2008   Archie Endo 04/10/2011  . COLON SURGERY  June 17, 2012   surgery for bleed  . OOPHORECTOMY  1944   not sure which ovary  . POSTERIOR LUMBAR FUSION  01/2009   L4-L5 Archie Endo 04/09/2011  . REDUCTION MAMMAPLASTY Bilateral 1980  . ROTATOR CUFF REPAIR Right   . SHOULDER OPEN ROTATOR CUFF REPAIR Right 2006   Archie Endo 04/22/2011  . TONSILLECTOMY     Family History  Problem Relation Age of Onset  . Melanoma Daughter        died age 73  . Melanoma Son    Social History   Substance and Sexual Activity  Sexual Activity No    Outpatient Encounter Medications as of 10/21/2017  Medication Sig  . apixaban (ELIQUIS) 2.5 MG TABS tablet Take 1 tablet (2.5 mg total) by mouth 2 (two) times daily.  . furosemide (LASIX) 20 MG tablet Take 20 mg by mouth daily.  Marland Kitchen gabapentin (NEURONTIN) 100 MG capsule Take 100 mg by mouth 3 (three) times daily.   . pantoprazole (PROTONIX) 40 MG tablet Take 40 mg by mouth daily.  . ropinirole (REQUIP) 5 MG  tablet Take 5 mg by mouth at bedtime.  . triamcinolone ointment (KENALOG) 0.5 % Apply 1 application topically 2 (two) times daily.   No facility-administered encounter medications on file as of 10/21/2017.     Activities of Daily Living No flowsheet data found.  Patient Care Team: Briscoe Deutscher, DO as PCP - General (Family Medicine) Gaye Pollack, MD as Consulting Physician (Cardiothoracic Surgery)   Assessment:    Physical assessment deferred to PCP.  Exercise Activities and Dietary recommendations    Goals    None     Fall Risk No flowsheet data found. Depression Screen No flowsheet data found.  Cognitive Function        Immunization History  Administered Date(s) Administered  . Influenza, High Dose Seasonal PF 09/10/2017   Screening Tests Health Maintenance  Topic Date Due  . FOOT EXAM   07/24/1931  . OPHTHALMOLOGY EXAM  07/24/1931  . URINE MICROALBUMIN  07/24/1931  . TETANUS/TDAP  07/23/1940  . DEXA SCAN  07/23/1986  . PNA vac Low Risk Adult (1 of 2 - PCV13) 07/23/1986  . HEMOGLOBIN A1C  03/22/2018  . INFLUENZA VACCINE  Completed      Plan:   Follow up with PCP as directed.  I have personally reviewed and noted the following in the patient's chart:   . Medical and social history . Use of alcohol, tobacco or illicit drugs  . Current medications and supplements . Functional ability and status . Nutritional status . Physical activity . Advanced directives . List of other physicians . Vitals . Screenings to include cognitive, depression, and falls . Referrals and appointments  In addition, I have reviewed and discussed with patient certain preventive protocols, quality metrics, and best practice recommendations. A written personalized care plan for preventive services as well as general preventive health recommendations were provided to patient.     Williemae Area, RN  10/19/2017

## 2017-10-19 NOTE — Progress Notes (Deleted)
Pre visit review using our clinic review tool, if applicable. No additional management support is needed unless otherwise documented below in the visit note. 

## 2017-10-19 NOTE — Progress Notes (Deleted)
PCP notes:   Health maintenance: Foot exam: Ophthalmology: Urine Micro: Tdap: Dexa: PCV13:  Abnormal screenings:    Patient concerns:    Nurse concerns:   Next PCP appt: 10/21/17 2:00

## 2017-10-21 ENCOUNTER — Ambulatory Visit: Payer: Medicare Other | Admitting: *Deleted

## 2017-10-21 ENCOUNTER — Ambulatory Visit: Payer: Self-pay | Admitting: Family Medicine

## 2017-10-24 ENCOUNTER — Emergency Department (HOSPITAL_COMMUNITY)
Admission: EM | Admit: 2017-10-24 | Discharge: 2017-10-24 | Disposition: A | Discharge status: Home / Self Care | Payer: Medicare Other | Attending: Emergency Medicine | Admitting: Emergency Medicine

## 2017-10-24 ENCOUNTER — Other Ambulatory Visit: Payer: Self-pay

## 2017-10-24 ENCOUNTER — Encounter (HOSPITAL_COMMUNITY): Payer: Self-pay

## 2017-10-24 ENCOUNTER — Emergency Department (HOSPITAL_COMMUNITY): Payer: Medicare Other

## 2017-10-24 DIAGNOSIS — R112 Nausea with vomiting, unspecified: Secondary | ICD-10-CM | POA: Diagnosis not present

## 2017-10-24 DIAGNOSIS — I10 Essential (primary) hypertension: Secondary | ICD-10-CM | POA: Diagnosis not present

## 2017-10-24 DIAGNOSIS — Z79899 Other long term (current) drug therapy: Secondary | ICD-10-CM | POA: Diagnosis not present

## 2017-10-24 DIAGNOSIS — R1084 Generalized abdominal pain: Secondary | ICD-10-CM

## 2017-10-24 DIAGNOSIS — R1013 Epigastric pain: Secondary | ICD-10-CM | POA: Diagnosis not present

## 2017-10-24 DIAGNOSIS — E119 Type 2 diabetes mellitus without complications: Secondary | ICD-10-CM | POA: Diagnosis not present

## 2017-10-24 DIAGNOSIS — E039 Hypothyroidism, unspecified: Secondary | ICD-10-CM | POA: Diagnosis not present

## 2017-10-24 LAB — I-STAT CHEM 8, ED
BUN: 26 mg/dL — AB (ref 6–20)
CHLORIDE: 108 mmol/L (ref 101–111)
Calcium, Ion: 1.07 mmol/L — ABNORMAL LOW (ref 1.15–1.40)
Creatinine, Ser: 1.2 mg/dL — ABNORMAL HIGH (ref 0.44–1.00)
Glucose, Bld: 127 mg/dL — ABNORMAL HIGH (ref 65–99)
HEMATOCRIT: 33 % — AB (ref 36.0–46.0)
Hemoglobin: 11.2 g/dL — ABNORMAL LOW (ref 12.0–15.0)
POTASSIUM: 3.8 mmol/L (ref 3.5–5.1)
Sodium: 140 mmol/L (ref 135–145)
TCO2: 24 mmol/L (ref 22–32)

## 2017-10-24 LAB — COMPREHENSIVE METABOLIC PANEL
ALK PHOS: 113 U/L (ref 38–126)
ALT: 13 U/L — AB (ref 14–54)
AST: 27 U/L (ref 15–41)
Albumin: 3.4 g/dL — ABNORMAL LOW (ref 3.5–5.0)
Anion gap: 8 (ref 5–15)
BILIRUBIN TOTAL: 0.7 mg/dL (ref 0.3–1.2)
BUN: 26 mg/dL — ABNORMAL HIGH (ref 6–20)
CALCIUM: 9 mg/dL (ref 8.9–10.3)
CO2: 25 mmol/L (ref 22–32)
CREATININE: 1.13 mg/dL — AB (ref 0.44–1.00)
Chloride: 106 mmol/L (ref 101–111)
GFR calc Af Amer: 46 mL/min — ABNORMAL LOW (ref >60)
GFR calc non Af Amer: 40 mL/min — ABNORMAL LOW (ref >60)
GLUCOSE: 125 mg/dL — AB (ref 65–99)
Potassium: 3.8 mmol/L (ref 3.5–5.1)
SODIUM: 139 mmol/L (ref 135–145)
TOTAL PROTEIN: 6 g/dL — AB (ref 6.5–8.1)

## 2017-10-24 LAB — CBC WITH DIFFERENTIAL/PLATELET
Basophils Absolute: 0.1 10*3/uL (ref 0.0–0.1)
Basophils Relative: 1 %
EOS ABS: 0.2 10*3/uL (ref 0.0–0.7)
Eosinophils Relative: 5 %
HEMATOCRIT: 34.9 % — AB (ref 36.0–46.0)
HEMOGLOBIN: 11.2 g/dL — AB (ref 12.0–15.0)
LYMPHS PCT: 35 %
Lymphs Abs: 1.7 10*3/uL (ref 0.7–4.0)
MCH: 30 pg (ref 26.0–34.0)
MCHC: 32.1 g/dL (ref 30.0–36.0)
MCV: 93.6 fL (ref 78.0–100.0)
MONOS PCT: 7 %
Monocytes Absolute: 0.4 10*3/uL (ref 0.1–1.0)
NEUTROS ABS: 2.5 10*3/uL (ref 1.7–7.7)
NEUTROS PCT: 52 %
Platelets: 154 10*3/uL (ref 150–400)
RBC: 3.73 MIL/uL — AB (ref 3.87–5.11)
RDW: 14 % (ref 11.5–15.5)
WBC: 4.8 10*3/uL (ref 4.0–10.5)

## 2017-10-24 LAB — LIPASE, BLOOD: Lipase: 20 U/L (ref 11–51)

## 2017-10-24 MED ORDER — MORPHINE SULFATE (PF) 2 MG/ML IV SOLN
2.0000 mg | Freq: Once | INTRAVENOUS | Status: AC
  Administered 2017-10-24: 2 mg via INTRAVENOUS
  Filled 2017-10-24: qty 1

## 2017-10-24 MED ORDER — SODIUM CHLORIDE 0.9 % IV BOLUS (SEPSIS)
1000.0000 mL | Freq: Once | INTRAVENOUS | Status: AC
  Administered 2017-10-24: 1000 mL via INTRAVENOUS

## 2017-10-24 MED ORDER — DIPHENHYDRAMINE HCL 25 MG PO CAPS
25.0000 mg | ORAL_CAPSULE | Freq: Once | ORAL | Status: AC
  Administered 2017-10-24: 25 mg via ORAL

## 2017-10-24 MED ORDER — ONDANSETRON 4 MG PO TBDP: ORAL_TABLET | ORAL | 0 refills | Status: DC

## 2017-10-24 MED ORDER — IOPAMIDOL (ISOVUE-300) INJECTION 61%
80.0000 mL | Freq: Once | INTRAVENOUS | Status: AC | PRN
  Administered 2017-10-24: 80 mL via INTRAVENOUS

## 2017-10-24 MED ORDER — ONDANSETRON HCL 4 MG/2ML IJ SOLN
4.0000 mg | Freq: Once | INTRAMUSCULAR | Status: AC
  Administered 2017-10-24: 4 mg via INTRAVENOUS
  Filled 2017-10-24: qty 2

## 2017-10-24 MED ORDER — DIPHENHYDRAMINE HCL 25 MG PO CAPS
ORAL_CAPSULE | ORAL | Status: AC
  Filled 2017-10-24: qty 1

## 2017-10-24 MED ORDER — IOPAMIDOL (ISOVUE-300) INJECTION 61%
INTRAVENOUS | Status: AC
  Filled 2017-10-24: qty 100

## 2017-10-24 NOTE — ED Notes (Signed)
Pt helped phone # Opal Sidles [phone]

## 2017-10-24 NOTE — ED Notes (Signed)
Patient currently discharged and waiting on a ride home. I been try to get a hold of her transport and no one answer the phone a this time. Pt does not want nurse to call [facility] because it cause a lot of money for her.

## 2017-10-24 NOTE — ED Notes (Signed)
Bed: WA09 Expected date:  Expected time:  Means of arrival:  Comments: Rm 7

## 2017-10-24 NOTE — ED Notes (Signed)
Bed: WA07 Expected date:  Expected time:  Means of arrival:  Comments: EMS elderly abdominal pain

## 2017-10-24 NOTE — ED Notes (Signed)
Patient agree to the nurse to call [facility] for transport home.

## 2017-10-24 NOTE — ED Provider Notes (Signed)
[facility] DEPT Provider Note   CSN: [id number] Arrival date & time: 10/24/17  0044     History   Chief Complaint Chief Complaint  Patient presents with  . Abdominal Pain    UPPER X3 DAYS    HPI [name] is a 83 y.o. female.  83 yo F with a chief complaint of epigastric abdominal pain.  This been going on since yesterday.  Associated with nausea and vomiting.  Denies fevers has had one loose stool.  Denies sick contacts denies suspicious food intake.  Describes the pain is sharp and stabbing.  Denies radiation.  Nothing seems to make this better or worse.  Having trouble taking oral intake at home.  The pain worsened this evening and so came to the emergency department.  No prior abdominal surgeries.   The history is provided by the patient.  Abdominal Pain   This is a new problem. The current episode started yesterday. The problem occurs constantly. The problem has been gradually worsening. The pain is associated with eating. The pain is located in the epigastric region. The quality of the pain is sharp and shooting. The pain is at a severity of 9/10. The pain is severe. Associated symptoms include nausea. Pertinent negatives include fever, diarrhea, vomiting, dysuria, headaches, arthralgias and myalgias. Nothing aggravates the symptoms. Nothing relieves the symptoms.    Past Medical History:  Diagnosis Date  . Acute gastritis without mention of hemorrhage   . Anemia   . Anxiety   . Arthritis    "LLL; palm of right hand" (06/12/2015)  . Atrial fibrillation ([place])   . CAP (community acquired pneumonia) 04/03/2014  . Crohn disease ([place])   . Cyst and pseudocyst of pancreas   . Depression   . Diaphragmatic hernia without mention of obstruction or gangrene   . Diverticulosis of colon (without mention of hemorrhage)   . Duodenitis without mention of hemorrhage   . DVT (deep venous thrombosis) ([place])    "twice on the left leg; once on the right  leg" (06/12/2015)  . Dysrhythmia   . Esophageal reflux   . Hiatal hernia- moderate to large 10/06/2014  . Hx pulmonary embolism   . Hypothyroidism   . Insomnia, unspecified   . Left leg DVT ([place]) jan 2013  . Migraine    hx  . OAB (overactive bladder)   . Other and unspecified hyperlipidemia   . Other malaise and fatigue   . Pancreas divisum 03/02/2015  . Pancreatic cyst   . Personal history of unspecified digestive disease   . Polymyalgia rheumatica ([place])   . Protein-calorie malnutrition, severe ([place]) 10/02/2014  . Restless legs syndrome (RLS)   . Shingles   . Stricture and stenosis of esophagus   . Type II diabetes mellitus ([place])   . Unspecified essential hypertension   . Unspecified sleep apnea    hx of sleep apnea, none since weight loss (06/12/2015)  . Urinary frequency   . Urinary urgency     Patient Active Problem List   Diagnosis Date Noted  . Dry skin dermatitis 09/24/2017  . Osteoarthritis 07/31/2017  . CKD stage 4 due to type 2 diabetes mellitus ([place]) 04/19/2017  . Iron deficiency anemia 04/19/2017  . B12 deficiency 04/08/2017  . Chronic constipation 04/08/2017  . Chronic deep vein thrombosis (DVT) of left lower extremity ([place]) 04/08/2017  . Episode of recurrent major depressive disorder ([place]) 04/08/2017  . Monoclonal gammopathy of unknown significance (MGUS) 04/08/2017  . Peripheral neuropathy  04/08/2017  . Rectal mass   . Pancreas divisum 03/02/2015  . Physical deconditioning 03/01/2015  . Pancreatic cyst   . Hiatal hernia- moderate to large 10/06/2014  . Heme + stool 10/04/2014  . Crohn's ileocolitis ([place]) 10/04/2014  . 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 Arthritis   . Hx pulmonary embolism   . Hypothyroidism 04/11/2014  . Type 2 diabetes mellitus with diabetic polyneuropathy, without long-term current use of insulin ([place]) 04/11/2014  . Anemia of chronic disease 04/03/2014  . OAB (overactive bladder) 01/07/2012  . Restless leg syndrome 03/09/2008  . Benign  essential hypertension 03/09/2008  . ESOPHAGEAL STRICTURE 03/09/2008  . Diverticulosis of large intestine 03/09/2008  . Polymyalgia rheumatica ([place]) 03/09/2008    Past Surgical History:  Procedure Laterality Date  . BACK SURGERY    . CATARACT EXTRACTION, BILATERAL Bilateral   . CLOSED REDUCTION NASAL FRACTURE  08/2008   Archie Endo 04/10/2011  . COLON SURGERY  June 17, 2012   surgery for bleed  . COLONOSCOPY N/A 06/13/2015   Performed by Irene Shipper, MD at [facility]  . OOPHORECTOMY  1944   not sure which ovary  . POSTERIOR LUMBAR FUSION  01/2009   L4-L5 Archie Endo 04/09/2011  . REDUCTION MAMMAPLASTY Bilateral 1980  . ROTATOR CUFF REPAIR Right   . ROTATOR CUFF REPAIR SHOULDER OPEN Left 08/04/2012   Performed by Tobi Bastos[facility] at [facility]  . SHOULDER OPEN ROTATOR CUFF REPAIR Right 2006   Archie Endo 04/22/2011  . TONSILLECTOMY      OB History    No data available       Home Medications    Prior to Admission medications   Medication Sig Start Date End Date Taking? Authorizing Provider  apixaban (ELIQUIS) 2.5 MG TABS tablet Take 1 tablet (2.5 mg total) by mouth 2 (two) times daily. 10/09/14   Regalado, Belkys A, MD  furosemide (LASIX) 20 MG tablet Take 20 mg by mouth daily.    [provider]  gabapentin (NEURONTIN) 100 MG capsule Take 100 mg by mouth 3 (three) times daily.     [provider]  pantoprazole (PROTONIX) 40 MG tablet Take 40 mg by mouth daily.    [provider]  ropinirole (REQUIP) 5 MG tablet Take 5 mg by mouth at bedtime.    [provider]  triamcinolone ointment (KENALOG) 0.5 % Apply 1 application topically 2 (two) times daily. 09/21/17   Briscoe Deutscher, DO    Family History Family History  Problem Relation Age of Onset  . Melanoma Daughter        died age 59  . Melanoma Son     Social History Social History   Tobacco Use  . Smoking status: Never Smoker  . Smokeless tobacco: Never Used  Substance Use Topics  . Alcohol  use: No  . Drug use: No     Allergies   Sulfa antibiotics and Penicillins   Review of Systems Review of Systems  Constitutional: Negative for chills and fever.  HENT: Negative for congestion and rhinorrhea.   Eyes: Negative for redness and visual disturbance.  Respiratory: Negative for shortness of breath and wheezing.   Cardiovascular: Negative for chest pain and palpitations.  Gastrointestinal: Positive for abdominal pain and nausea. Negative for diarrhea and vomiting.  Genitourinary: Negative for dysuria and urgency.  Musculoskeletal: Negative for arthralgias and myalgias.  Skin: Negative for pallor and wound.  Neurological: Negative for dizziness and headaches.     Physical Exam Updated Vital Signs BP Marland Kitchen)  129/53   Pulse 65   Temp 97.6 F (36.4 C) (Oral)   Resp 15   Ht 5' 0.5" (1.537 m)   Wt 54.4 kg (120 lb)   SpO2 93%   BMI 23.05 kg/m   Physical Exam  Constitutional: She is oriented to person, place, and time. She appears well-developed and well-nourished. No distress.  HENT:  Head: Normocephalic and atraumatic.  Eyes: EOM are normal. Pupils are equal, round, and reactive to light.  Neck: Normal range of motion. Neck supple.  Cardiovascular: Normal rate and regular rhythm. Exam reveals no gallop and no friction rub.  No murmur heard. Pulmonary/Chest: Effort normal. She has no wheezes. She has no rales.  Abdominal: Soft. She exhibits no distension. There is tenderness in the epigastric area. There is negative Murphy's sign.  Musculoskeletal: She exhibits no edema or tenderness.  Neurological: She is alert and oriented to person, place, and time.  Skin: Skin is warm and dry. She is not diaphoretic.  Psychiatric: She has a normal mood and affect. Her behavior is normal.  Nursing note and vitals reviewed.    ED Treatments / Results  Labs (all labs ordered are listed, but only abnormal results are displayed) Labs Reviewed  CBC WITH DIFFERENTIAL/PLATELET -  Abnormal; Notable for the following components:      Result Value   RBC 3.73 (*)    Hemoglobin 11.2 (*)    HCT 34.9 (*)    All other components within normal limits  COMPREHENSIVE METABOLIC PANEL - Abnormal; Notable for the following components:   Glucose, Bld 125 (*)    BUN 26 (*)    Creatinine, Ser 1.13 (*)    Total Protein 6.0 (*)    Albumin 3.4 (*)    ALT 13 (*)    GFR calc non Af Amer 40 (*)    GFR calc Af Amer 46 (*)    All other components within normal limits  I-STAT CHEM 8, ED - Abnormal; Notable for the following components:   BUN 26 (*)    Creatinine, Ser 1.20 (*)    Glucose, Bld 127 (*)    Calcium, Ion 1.07 (*)    Hemoglobin 11.2 (*)    HCT 33.0 (*)    All other components within normal limits  LIPASE, BLOOD    EKG  EKG Interpretation None       Radiology Ct Abdomen Pelvis W Contrast  Result Date: 10/24/2017 CLINICAL DATA:  83 year old female with abdominal pain.  Vomiting. EXAM: CT ABDOMEN AND PELVIS WITH CONTRAST TECHNIQUE: Multidetector CT imaging of the abdomen and pelvis was performed using the standard protocol following bolus administration of intravenous contrast. CONTRAST:  19mL ISOVUE-300 IOPAMIDOL (ISOVUE-300) INJECTION 61% COMPARISON:  Abdominal CT dated 02/28/2015 and MRI dated 03/01/2015. FINDINGS: Lower chest: The visualized lung bases are clear. Partially visualized calcified mitral annulus. No intra-abdominal free air or free fluid. Hepatobiliary: Subcentimeter hypodense lesion in the dome of the liver similar to prior CT. This is not characterized on this study but characterized as cyst on the prior MRI. The liver is otherwise unremarkable. The gallbladder is unremarkable. Pancreas: Cystic lesions of the pancreas as seen on the prior CT and MRI. No inflammatory changes of the pancreas. Spleen: Normal in size without focal abnormality. Adrenals/Urinary Tract: The adrenal glands are unremarkable. There is no hydronephrosis on either side. There is  symmetric enhancement and excretion of contrast by both kidneys. There is a 3 cm Hutch diverticulum from the right bladder. Stomach/Bowel: There is  a moderate size hiatal hernia which is partially visualized and contains portion of the stomach. There is thickened appearance of the distal stomach and gastric antrum which may represent gastritis. Clinical correlation is recommended. Nondistended fluid-filled loops of small bowel noted which may be physiologic or represent enteritis. There is no bowel obstruction. There is severe sigmoid diverticulosis without active inflammatory changes. There is a 15 mm duodenal diverticulum. Loose stool noted throughout the colon. The appendix is not visualized with certainty. No inflammatory changes identified in the right lower quadrant. Vascular/Lymphatic: There is advanced atherosclerotic calcification of the aorta and iliac arteries. There is atherosclerotic calcification of the origins of the celiac axis, SMA, and the renal arteries. The SMV, splenic vein, and main portal vein are patent. No portal venous gas. There is no adenopathy. Reproductive: The uterus is grossly unremarkable. Other: None Musculoskeletal: Osteopenia with extensive degenerative changes of the spine. L4-L5 disc spacer and L4-S1 posterior fusion screws. There is grade 1 L3-L4 anterolisthesis. No acute fracture. IMPRESSION: 1. Moderate size hiatal hernia containing portion of the stomach. No bowel obstruction. 2. Diarrheal state with findings suggestive of gastroenteritis. Clinical correlation is recommended. 3. Extensive sigmoid diverticulosis.  No active inflammation. 4. Cystic lesion of the liver and pancreas as seen on the prior CT and MRI. 5.  Aortic Atherosclerosis (ICD10-I70.0). Electronically Signed   By: Anner Crete M.D.   On: 10/24/2017 04:07    Procedures Procedures (including critical care time)  Medications Ordered in ED Medications  diphenhydrAMINE (BENADRYL) 25 mg capsule (  Not  Given 10/24/17 0245)  iopamidol (ISOVUE-300) 61 % injection (not administered)  sodium chloride 0.9 % bolus 1,000 mL (0 mLs Intravenous Stopped 10/24/17 0422)  ondansetron (ZOFRAN) injection 4 mg (4 mg Intravenous Given 10/24/17 0215)  morphine 2 MG/ML injection 2 mg (2 mg Intravenous Given 10/24/17 0216)  diphenhydrAMINE (BENADRYL) capsule 25 mg (25 mg Oral Given 10/24/17 0230)  iopamidol (ISOVUE-300) 61 % injection 80 mL (80 mLs Intravenous Contrast Given 10/24/17 0337)     Initial Impression / Assessment and Plan / ED Course  I have reviewed the triage vital signs and the nursing notes.  Pertinent labs & imaging results that were available during my care of the patient were reviewed by me and considered in my medical decision making (see chart for details).     83 yo F with a chief complaint of epigastric abdominal pain.  Going on since yesterday.  The patient has significant epigastric pain on my exam.  Will CT.  CT scan with findings concerning for acute gastroenteritis.  Otherwise there is no concerning finding on CT.  Patient is feeling much better after symptomatic therapy.  Will DC home.  4:59 AM:  I have discussed the diagnosis/risks/treatment options with the patient and family and believe the pt to be eligible for discharge home to follow-up with PCP. We also discussed returning to the ED immediately if new or worsening sx occur. We discussed the sx which are most concerning (e.g., sudden worsening pain, fever, inability to tolerate by mouth) that necessitate immediate return. Medications administered to the patient during their visit and any new prescriptions provided to the patient are listed below.  Medications given during this visit Medications  diphenhydrAMINE (BENADRYL) 25 mg capsule (  Not Given 10/24/17 0245)  iopamidol (ISOVUE-300) 61 % injection (not administered)  sodium chloride 0.9 % bolus 1,000 mL (0 mLs Intravenous Stopped 10/24/17 0422)  ondansetron (ZOFRAN)  injection 4 mg (4 mg Intravenous Given 10/24/17 0215)  morphine 2 MG/ML injection 2 mg (2 mg Intravenous Given 10/24/17 0216)  diphenhydrAMINE (BENADRYL) capsule 25 mg (25 mg Oral Given 10/24/17 0230)  iopamidol (ISOVUE-300) 61 % injection 80 mL (80 mLs Intravenous Contrast Given 10/24/17 5909)     The patient appears reasonably screen and/or stabilized for discharge and I doubt any other medical condition or other [facility] requiring further screening, evaluation, or treatment in the ED at this time prior to discharge.    Final Clinical Impressions(s) / ED Diagnoses   Final diagnoses:  Epigastric pain    ED Discharge Orders    None       Deno Etienne, DO 10/24/17 0500

## 2017-10-24 NOTE — ED Triage Notes (Signed)
PT RECEIVED FROM HOME VIA EMS C/O UPPER ABDOMINAL PAIN WITH VOMITING X3 DAYS AFTER EATING A MAYONNAISE AND BACON SANDWICH. PT WAS GIVEN ZOFRAN 4MG  IV X1 PTA. PT DESCRIBES THE PAIN AS CRAMPING. DENIED DIARRHEA.  BP 116/56 HR 58 O2 97% RA CBG 144

## 2017-10-26 ENCOUNTER — Ambulatory Visit: Payer: Medicare Other | Admitting: *Deleted

## 2017-10-26 ENCOUNTER — Ambulatory Visit: Payer: Medicare Other | Admitting: Family Medicine

## 2017-11-01 ENCOUNTER — Emergency Department (HOSPITAL_COMMUNITY)
Admission: EM | Admit: 2017-11-01 | Discharge: 2017-11-01 | Disposition: A | Discharge status: Home / Self Care | Payer: Medicare Other | Attending: Emergency Medicine | Admitting: Emergency Medicine

## 2017-11-01 ENCOUNTER — Encounter (HOSPITAL_COMMUNITY): Payer: Self-pay

## 2017-11-01 ENCOUNTER — Emergency Department (HOSPITAL_COMMUNITY): Payer: Medicare Other

## 2017-11-01 ENCOUNTER — Other Ambulatory Visit: Payer: Self-pay

## 2017-11-01 DIAGNOSIS — N184 Chronic kidney disease, stage 4 (severe): Secondary | ICD-10-CM | POA: Diagnosis not present

## 2017-11-01 DIAGNOSIS — F419 Anxiety disorder, unspecified: Secondary | ICD-10-CM | POA: Diagnosis not present

## 2017-11-01 DIAGNOSIS — Z7901 Long term (current) use of anticoagulants: Secondary | ICD-10-CM | POA: Diagnosis not present

## 2017-11-01 DIAGNOSIS — Z7984 Long term (current) use of oral hypoglycemic drugs: Secondary | ICD-10-CM | POA: Diagnosis not present

## 2017-11-01 DIAGNOSIS — R1110 Vomiting, unspecified: Secondary | ICD-10-CM | POA: Insufficient documentation

## 2017-11-01 DIAGNOSIS — E039 Hypothyroidism, unspecified: Secondary | ICD-10-CM | POA: Insufficient documentation

## 2017-11-01 DIAGNOSIS — Z79899 Other long term (current) drug therapy: Secondary | ICD-10-CM | POA: Diagnosis not present

## 2017-11-01 DIAGNOSIS — N390 Urinary tract infection, site not specified: Secondary | ICD-10-CM | POA: Diagnosis not present

## 2017-11-01 DIAGNOSIS — I129 Hypertensive chronic kidney disease with stage 1 through stage 4 chronic kidney disease, or unspecified chronic kidney disease: Secondary | ICD-10-CM | POA: Diagnosis not present

## 2017-11-01 DIAGNOSIS — E1122 Type 2 diabetes mellitus with diabetic chronic kidney disease: Secondary | ICD-10-CM | POA: Insufficient documentation

## 2017-11-01 DIAGNOSIS — R05 Cough: Secondary | ICD-10-CM | POA: Diagnosis present

## 2017-11-01 DIAGNOSIS — F329 Major depressive disorder, single episode, unspecified: Secondary | ICD-10-CM | POA: Diagnosis not present

## 2017-11-01 LAB — CBC WITH DIFFERENTIAL/PLATELET
BASOS ABS: 0 10*3/uL (ref 0.0–0.1)
Basophils Relative: 1 %
EOS PCT: 3 %
Eosinophils Absolute: 0.2 10*3/uL (ref 0.0–0.7)
HEMATOCRIT: 36.3 % (ref 36.0–46.0)
HEMOGLOBIN: 12 g/dL (ref 12.0–15.0)
LYMPHS ABS: 1.7 10*3/uL (ref 0.7–4.0)
LYMPHS PCT: 35 %
MCH: 30.8 pg (ref 26.0–34.0)
MCHC: 33.1 g/dL (ref 30.0–36.0)
MCV: 93.3 fL (ref 78.0–100.0)
Monocytes Absolute: 0.4 10*3/uL (ref 0.1–1.0)
Monocytes Relative: 8 %
NEUTROS ABS: 2.5 10*3/uL (ref 1.7–7.7)
NEUTROS PCT: 53 %
PLATELETS: 192 10*3/uL (ref 150–400)
RBC: 3.89 MIL/uL (ref 3.87–5.11)
RDW: 13.7 % (ref 11.5–15.5)
WBC: 4.8 10*3/uL (ref 4.0–10.5)

## 2017-11-01 LAB — URINALYSIS, ROUTINE W REFLEX MICROSCOPIC
Bilirubin Urine: NEGATIVE
Glucose, UA: NEGATIVE mg/dL
KETONES UR: 20 mg/dL — AB
Nitrite: POSITIVE — AB
PH: 5 (ref 5.0–8.0)
PROTEIN: NEGATIVE mg/dL
Specific Gravity, Urine: 1.012 (ref 1.005–1.030)

## 2017-11-01 LAB — COMPREHENSIVE METABOLIC PANEL
ALK PHOS: 115 U/L (ref 38–126)
ALT: 13 U/L — AB (ref 14–54)
AST: 25 U/L (ref 15–41)
Albumin: 3.5 g/dL (ref 3.5–5.0)
Anion gap: 8 (ref 5–15)
BUN: 22 mg/dL — ABNORMAL HIGH (ref 6–20)
CALCIUM: 8.9 mg/dL (ref 8.9–10.3)
CHLORIDE: 106 mmol/L (ref 101–111)
CO2: 24 mmol/L (ref 22–32)
CREATININE: 1.27 mg/dL — AB (ref 0.44–1.00)
GFR, EST AFRICAN AMERICAN: 40 mL/min — AB (ref >60)
GFR, EST NON AFRICAN AMERICAN: 34 mL/min — AB (ref >60)
Glucose, Bld: 86 mg/dL (ref 65–99)
Potassium: 4.1 mmol/L (ref 3.5–5.1)
Sodium: 138 mmol/L (ref 135–145)
Total Bilirubin: 1.1 mg/dL (ref 0.3–1.2)
Total Protein: 6.4 g/dL — ABNORMAL LOW (ref 6.5–8.1)

## 2017-11-01 LAB — LIPASE, BLOOD: LIPASE: 23 U/L (ref 11–51)

## 2017-11-01 LAB — TROPONIN I: TROPONIN I: 0.03 ng/mL — AB (ref <0.03)

## 2017-11-01 MED ORDER — NITROFURANTOIN MONOHYD MACRO 100 MG PO CAPS: 100.0000 mg | ORAL_CAPSULE | Freq: Two times a day (BID) | ORAL | 0 refills | Status: DC

## 2017-11-01 MED ORDER — ONDANSETRON HCL 4 MG PO TABS: 4.0000 mg | ORAL_TABLET | Freq: Three times a day (TID) | ORAL | 0 refills | Status: DC | PRN

## 2017-11-01 MED ORDER — NITROFURANTOIN MONOHYD MACRO 100 MG PO CAPS
100.0000 mg | ORAL_CAPSULE | Freq: Once | ORAL | Status: AC
  Administered 2017-11-01: 100 mg via ORAL
  Filled 2017-11-01: qty 1

## 2017-11-01 MED ORDER — ONDANSETRON HCL 4 MG/2ML IJ SOLN
4.0000 mg | Freq: Once | INTRAMUSCULAR | Status: AC
  Administered 2017-11-01: 4 mg via INTRAVENOUS
  Filled 2017-11-01: qty 2

## 2017-11-01 MED ORDER — SODIUM CHLORIDE 0.9 % IV BOLUS (SEPSIS)
1000.0000 mL | Freq: Once | INTRAVENOUS | Status: AC
  Administered 2017-11-01: 1000 mL via INTRAVENOUS

## 2017-11-01 MED ORDER — FAMOTIDINE IN NACL 20-0.9 MG/50ML-% IV SOLN
20.0000 mg | Freq: Once | INTRAVENOUS | Status: AC
  Administered 2017-11-01: 20 mg via INTRAVENOUS
  Filled 2017-11-01: qty 50

## 2017-11-01 NOTE — ED Notes (Signed)
We have been unable to contact any of her friends/family, however, we are continuing to try to locate them for her ride home. She remains in no distress.

## 2017-11-01 NOTE — ED Notes (Signed)
Attempted to call neighbor per pt request to take pt home. There was no answer, just voice mail. Will attempt to call again later.

## 2017-11-01 NOTE — ED Triage Notes (Signed)
She tells [place] she is here "because I threw up". She states she did not vomit yesterday, but did have a diarrhea stool yesterday--she denies having any stool(s) today. She is in no distress. When I ask her if she has any family, she replies "yes, but I don't want you to call them". She denies any pain.

## 2017-11-01 NOTE — ED Provider Notes (Signed)
Emergency Department Provider Note   I have reviewed the triage vital signs and the nursing notes.   HISTORY  Chief Complaint Emesis   HPI [name] is a 83 y.o. female with a history of gastritis, anemia, arthritis present coughing up phlegm.  Patient states that she has been "sick all day".  Note this encounter is at 7:30 in the morning.  She also states that she had an episode of diarrhea yesterday.  She has not had any vomitus just clear phlegm.  She still is having some epigastric pain.  No urinary symptoms or lower abdominal pain.  Cough with the vomiting but no other time.  No other associated or modifying symptoms.    Past Medical History:  Diagnosis Date  . Acute gastritis without mention of hemorrhage   . Anemia   . Anxiety   . Arthritis    "LLL; palm of right hand" (06/12/2015)  . Atrial fibrillation ([place])   . CAP (community acquired pneumonia) 04/03/2014  . Crohn disease ([place])   . Cyst and pseudocyst of pancreas   . Depression   . Diaphragmatic hernia without mention of obstruction or gangrene   . Diverticulosis of colon (without mention of hemorrhage)   . Duodenitis without mention of hemorrhage   . DVT (deep venous thrombosis) ([place])    "twice on the left leg; once on the right leg" (06/12/2015)  . Dysrhythmia   . Esophageal reflux   . Hiatal hernia- moderate to large 10/06/2014  . Hx pulmonary embolism   . Hypothyroidism   . Insomnia, unspecified   . Left leg DVT ([place]) jan 2013  . Migraine    hx  . OAB (overactive bladder)   . Other and unspecified hyperlipidemia   . Other malaise and fatigue   . Pancreas divisum 03/02/2015  . Pancreatic cyst   . Personal history of unspecified digestive disease   . Polymyalgia rheumatica ([place])   . Protein-calorie malnutrition, severe ([place]) 10/02/2014  . Restless legs syndrome (RLS)   . Shingles   . Stricture and stenosis of esophagus   . Type II diabetes mellitus ([place])   . Unspecified essential hypertension     . Unspecified sleep apnea    hx of sleep apnea, none since weight loss (06/12/2015)  . Urinary frequency   . Urinary urgency     Patient Active Problem List   Diagnosis Date Noted  . Dry skin dermatitis 09/24/2017  . Osteoarthritis 07/31/2017  . CKD stage 4 due to type 2 diabetes mellitus ([place]) 04/19/2017  . Iron deficiency anemia 04/19/2017  . B12 deficiency 04/08/2017  . Chronic constipation 04/08/2017  . Chronic deep vein thrombosis (DVT) of left lower extremity ([place]) 04/08/2017  . Episode of recurrent major depressive disorder ([place]) 04/08/2017  . Monoclonal gammopathy of unknown significance (MGUS) 04/08/2017  . Peripheral neuropathy 04/08/2017  . Rectal mass   . Pancreas divisum 03/02/2015  . Physical deconditioning 03/01/2015  . Pancreatic cyst   . Hiatal hernia- moderate to large 10/06/2014  . Heme + stool 10/04/2014  . Crohn's ileocolitis ([place]) 10/04/2014  . 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 Arthritis   . Hx pulmonary embolism   . Hypothyroidism 04/11/2014  . Type 2 diabetes mellitus with diabetic polyneuropathy, without long-term current use of insulin ([place]) 04/11/2014  . Anemia of chronic disease 04/03/2014  . OAB (overactive bladder) 01/07/2012  . Restless leg syndrome 03/09/2008  . Benign essential hypertension 03/09/2008  . ESOPHAGEAL STRICTURE 03/09/2008  . Diverticulosis of large  intestine 03/09/2008  . Polymyalgia rheumatica ([place]) 03/09/2008    Past Surgical History:  Procedure Laterality Date  . BACK SURGERY    . CATARACT EXTRACTION, BILATERAL Bilateral   . CLOSED REDUCTION NASAL FRACTURE  08/2008   Archie Endo 04/10/2011  . COLON SURGERY  June 17, 2012   surgery for bleed  . COLONOSCOPY N/A 06/13/2015   Procedure: COLONOSCOPY;  Surgeon: Irene Shipper, MD;  Location: [facility];  Service: Endoscopy;  Laterality: N/A;  . OOPHORECTOMY  1944   not sure which ovary  . POSTERIOR LUMBAR FUSION  01/2009   L4-L5 Archie Endo 04/09/2011  . REDUCTION MAMMAPLASTY Bilateral 1980  .  ROTATOR CUFF REPAIR Right   . SHOULDER OPEN ROTATOR CUFF REPAIR  08/04/2012   Procedure: ROTATOR CUFF REPAIR SHOULDER OPEN;  Surgeon: Tobi Bastos, MD;  Location: [facility];  Service: Orthopedics;  Laterality: Left;  with Anchors and Graft  . SHOULDER OPEN ROTATOR CUFF REPAIR Right 2006   Archie Endo 04/22/2011  . TONSILLECTOMY      Current Outpatient Rx  . Order #: [id number] Class: Print  . Order #: [id number] Class: Historical Med  . Order #: [id number] Class: Print  . Order #: [id number] Class: Print  . Order #: [id number] Class: Print  . Order #: [id number] Class: Historical Med  . Order #: [id number] Class: Historical Med  . Order #: [id number] Class: Historical Med  . Order #: [id number] Class: Normal    Allergies Sulfa antibiotics and Penicillins  Family History  Problem Relation Age of Onset  . Melanoma Daughter        died age 35  . Melanoma Son     Social History Social History   Tobacco Use  . Smoking status: Never Smoker  . Smokeless tobacco: Never Used  Substance Use Topics  . Alcohol use: No  . Drug use: No    Review of Systems  All other systems negative except as documented in the HPI. All pertinent positives and negatives as reviewed in the HPI. ____________________________________________   PHYSICAL EXAM:  VITAL SIGNS: ED Triage Vitals  Enc Vitals Group     BP 11/01/17 0718 (!) 149/72     Pulse Rate 11/01/17 0718 68     Resp 11/01/17 0718 16     Temp 11/01/17 0718 98.3 F (36.8 C)     Temp Source 11/01/17 0718 Oral     SpO2 11/01/17 0718 97 %    Constitutional: Alert and oriented. Well appearing and in no acute distress. Eyes: Conjunctivae are normal. PERRL. EOMI. Head: Atraumatic. Nose: No congestion/rhinnorhea. Mouth/Throat: Mucous membranes are dry.  Oropharynx non-erythematous. Neck: No stridor.  No meningeal signs.   Cardiovascular: Normal rate, regular rhythm. Good peripheral circulation. Grossly normal heart sounds.   Respiratory: Normal  respiratory effort.  No retractions. Lungs CTAB. Gastrointestinal: Soft and nontender. No distention.  Musculoskeletal: No lower extremity tenderness nor edema. No gross deformities of extremities. Neurologic:  Normal speech and language. No gross focal neurologic deficits are appreciated.  Skin:  Skin is warm, dry and intact. No rash noted.   ____________________________________________   LABS (all labs ordered are listed, but only abnormal results are displayed)  Labs Reviewed  COMPREHENSIVE METABOLIC PANEL - Abnormal; Notable for the following components:      Result Value   BUN 22 (*)    Creatinine, Ser 1.27 (*)    Total Protein 6.4 (*)    ALT 13 (*)    GFR calc non Af Amer 34 (*)    GFR calc Af Amer 40 (*)  All other components within normal limits  URINALYSIS, ROUTINE W REFLEX MICROSCOPIC - Abnormal; Notable for the following components:   Hgb urine dipstick SMALL (*)    Ketones, ur 20 (*)    Nitrite POSITIVE (*)    Leukocytes, UA MODERATE (*)    Bacteria, UA MANY (*)    Squamous Epithelial / LPF 0-5 (*)    All other components within normal limits  TROPONIN I - Abnormal; Notable for the following components:   Troponin I 0.03 (*)    All other components within normal limits  URINE CULTURE  CBC WITH DIFFERENTIAL/PLATELET  LIPASE, BLOOD   ____________________________________________  EKG   EKG Interpretation  Date/Time:  Sunday November 01 2017 09:02:53 EST Ventricular Rate:  64 PR Interval:    QRS Duration: 135 QT Interval:  469 QTC Calculation: 484 R Axis:   -47 Text Interpretation:  Sinus rhythm RBBB and LAFB Probable left ventricular hypertrophy ST elevation suggests acute pericarditis ST elevation similar to october 2017 with slight worsening Confirmed by Merrily Pew [phone]) on 11/01/2017 9:26:33 AM       ____________________________________________  RADIOLOGY  Dg Chest 2 View  Result Date: 11/01/2017 CLINICAL DATA:  83 year old female with  history of nausea, vomiting and diarrhea for the past 2 days. Weakness and chills. EXAM: CHEST  2 VIEW COMPARISON:  Chest x-ray 09/12/2016. FINDINGS: Lung volumes are normal. No consolidative airspace disease. No pleural effusions. No pneumothorax. No pulmonary nodule or mass noted. Pulmonary vasculature and the cardiomediastinal silhouette are within normal limits. Severe calcifications of the mitral annulus. Atherosclerosis in the thoracic aorta. IMPRESSION: 1.  No radiographic evidence of acute cardiopulmonary disease. 2. Aortic atherosclerosis. 3. Severe calcifications of the mitral annulus. Electronically Signed   By: Vinnie Langton M.D.   On: 11/01/2017 08:11    ____________________________________________   PROCEDURES  Procedure(s) performed:   Procedures   ____________________________________________   INITIAL IMPRESSION / ASSESSMENT AND PLAN / ED COURSE  Pertinent labs & imaging results that were available during my care of the patient were reviewed by me and considered in my medical decision making (see chart for details).  UTI. No e/o complications. Cx sent. Stable for discharge.    ____________________________________________  FINAL CLINICAL IMPRESSION(S) / ED DIAGNOSES  Final diagnoses:  Lower urinary tract infectious disease     MEDICATIONS GIVEN DURING THIS VISIT:  Medications  ondansetron (ZOFRAN) injection 4 mg (4 mg Intravenous Given 11/01/17 0827)  sodium chloride 0.9 % bolus 1,000 mL (0 mLs Intravenous Stopped 11/01/17 1109)  famotidine (PEPCID) IVPB 20 mg premix (0 mg Intravenous Stopped 11/01/17 0919)  nitrofurantoin (macrocrystal-monohydrate) (MACROBID) capsule 100 mg (100 mg Oral Given 11/01/17 1117)     NEW OUTPATIENT MEDICATIONS STARTED DURING THIS VISIT:  This SmartLink is deprecated. Use AVSMEDLIST instead to display the medication list for a patient.  Note:  This note was prepared with assistance of Dragon voice recognition software.  Occasional wrong-word or sound-a-like substitutions may have occurred due to the inherent limitations of voice recognition software.   Merrily Pew, MD 11/01/17 [phone]

## 2017-11-01 NOTE — ED Notes (Signed)
We have attempted to call several people for a ride home to no avail. Will call taxi now. She does have her door key with her.

## 2017-11-01 NOTE — ED Notes (Signed)
We are in the process of calling a taxi. Pt. Remains in no distress.

## 2017-11-01 NOTE — ED Notes (Signed)
Bed: BO47 Expected date:  Expected time:  Means of arrival:  Comments: EMS 83 yo female from home nausea/vomiting and diarrhea x 3 days

## 2017-11-01 NOTE — ED Notes (Signed)
She states she feels "better". She states earlier today she had had some epigastric discomfort; and now that is "gone".

## 2017-11-03 ENCOUNTER — Encounter: Payer: Self-pay | Admitting: Family Medicine

## 2017-11-03 ENCOUNTER — Ambulatory Visit (INDEPENDENT_AMBULATORY_CARE_PROVIDER_SITE_OTHER): Payer: Medicare Other | Admitting: Family Medicine

## 2017-11-03 ENCOUNTER — Ambulatory Visit (INDEPENDENT_AMBULATORY_CARE_PROVIDER_SITE_OTHER): Payer: Medicare Other | Admitting: *Deleted

## 2017-11-03 ENCOUNTER — Encounter: Payer: Self-pay | Admitting: *Deleted

## 2017-11-03 VITALS — BP 110/64 | HR 95 | Temp 97.4°F | Ht 61.0 in | Wt 123.8 lb

## 2017-11-03 VITALS — BP 110/64 | HR 95 | Resp 16 | Ht 61.0 in | Wt 123.8 lb

## 2017-11-03 DIAGNOSIS — E1122 Type 2 diabetes mellitus with diabetic chronic kidney disease: Secondary | ICD-10-CM | POA: Diagnosis not present

## 2017-11-03 DIAGNOSIS — N184 Chronic kidney disease, stage 4 (severe): Secondary | ICD-10-CM | POA: Diagnosis not present

## 2017-11-03 DIAGNOSIS — N3001 Acute cystitis with hematuria: Secondary | ICD-10-CM | POA: Diagnosis not present

## 2017-11-03 DIAGNOSIS — Z0000 Encounter for general adult medical examination without abnormal findings: Secondary | ICD-10-CM

## 2017-11-03 DIAGNOSIS — N183 Chronic kidney disease, stage 3 (moderate): Secondary | ICD-10-CM | POA: Diagnosis not present

## 2017-11-03 DIAGNOSIS — H538 Other visual disturbances: Secondary | ICD-10-CM

## 2017-11-03 DIAGNOSIS — R262 Difficulty in walking, not elsewhere classified: Secondary | ICD-10-CM | POA: Diagnosis not present

## 2017-11-03 LAB — GLUCOSE, POCT (MANUAL RESULT ENTRY): POC Glucose: 146 mg/dl — AB (ref 70–99)

## 2017-11-03 NOTE — Progress Notes (Signed)
Subjective:   [name] is a 83 y.o. female who presents for Medicare Annual (Subsequent) preventive examination.  Pt lives alone and neighbors check on her.  Review of Systems:  No ROS.  Medicare Wellness Visit. Additional risk factors are reflected in the social history.  Cardiac Risk Factors include: advanced age (>70mn, >>88women)     Objective:     Vitals: BP 110/64 (BP Location: Right Arm, Patient Position: Sitting, Cuff Size: Normal)   Pulse 95   Resp 16   Ht 5' 1"  (1.549 m)   Wt 123 lb 12.8 oz (56.2 kg)   SpO2 95%   BMI 23.39 kg/m   Body mass index is 23.39 kg/m.   Tobacco Social History   Tobacco Use  Smoking Status Never Smoker  Smokeless Tobacco Never Used     Counseling given: Not Answered   Past Medical History:  Diagnosis Date  . Acute gastritis without mention of hemorrhage   . Anemia   . Anxiety   . Arthritis    "LLL; palm of right hand" (06/12/2015)  . Atrial fibrillation ([place]   . CAP (community acquired pneumonia) 04/03/2014  . Crohn disease ([place]   . Cyst and pseudocyst of pancreas   . Depression   . Diaphragmatic hernia without mention of obstruction or gangrene   . Diverticulosis of colon (without mention of hemorrhage)   . Duodenitis without mention of hemorrhage   . D[place] ([place]) ([place]    "twice on the left leg; once on the right leg" (06/12/2015)  . Dysrhythmia   . Esophageal reflux   . Hiatal hernia- moderate to large 10/06/2014  . Hx pulmonary embolism   . Hypothyroidism   . Insomnia, unspecified   . Left leg DVT ([place] jan 2013  . Migraine    hx  . OAB (overactive bladder)   . Other and unspecified hyperlipidemia   . Other malaise and fatigue   . Pancreas divisum 03/02/2015  . Pancreatic cyst   . Personal history of unspecified digestive disease   . Pol[place]algia rheumatica ([place]   . Protein-calorie malnutrition, severe ([place] 10/02/2014  . Restless legs syndrome (RLS)   . Shingles   . Stricture and  stenosis of esophagus   . Type II diabetes mellitus ([place]   . Unspecified essential hypertension   . Unspecified sleep apnea    hx of sleep apnea, none since weight loss (06/12/2015)  . Urinary frequency   . Urinary urgency    Past Surgical History:  Procedure Laterality Date  . BACK SURGERY    . CATARACT EXTRACTION, BILATERAL Bilateral   . CLOSED REDUCTION NASAL FRACTURE  08/2008   /Archie Endo5/02/2011  . COLON SURGERY  June 17, 2012   surgery for bleed  . COLONOSCOPY N/A 06/13/2015   Procedure: COLONOSCOPY;  Surgeon: JIrene Shipper MD;  [facility]  Service: Endoscopy;  Laterality: N/A;  . OOPHORECTOMY  1944   not sure which ovary  . POSTERIOR LUMBAR FUSION  01/2009   L4-L5 /Archie Endo5/01/2011  . REDUCTION MAMMAPLASTY Bilateral 1980  . ROTATOR CUFF REPAIR Right   . SHOULDER OPEN ROTATOR CUFF REPAIR  08/04/2012   Procedure: ROTATOR CUFF REPAIR SHOULDER OPEN;  Surgeon: RTobi Bastos MD;  [facility];  Service: Orthopedics;  Laterality: Left;  with Anchors and Graft  . SHOULDER OPEN ROTATOR CUFF REPAIR Right 2006   /Archie Endo5/15/2012  . TONSILLECTOMY     Family History  Problem Relation Age of  Onset  . Melanoma Daughter        died age 2  . Melanoma Son    Social History   Substance and Sexual Activity  Sexual Activity No    Outpatient Encounter Medications as of 11/03/2017  Medication Sig  . apixaban (ELIQUIS) 2.5 MG TABS tablet Take 1 tablet (2.5 mg total) by mouth 2 (two) times daily.  . furosemide (LASIX) 20 MG tablet Take 20 mg by mouth daily.  . ondansetron (ZOFRAN ODT) 4 MG disintegrating tablet 47m ODT q4 hours prn nausea/vomit  . pantoprazole (PROTONIX) 40 MG tablet Take 40 mg by mouth daily.  . ropinirole (REQUIP) 5 MG tablet Take 5 mg by mouth at bedtime.  . triamcinolone ointment (KENALOG) 0.5 % Apply 1 application topically 2 (two) times daily. (Patient taking differently: Apply 1 application 2 (two) times daily as needed topically (irritation,rash). )    . nitrofurantoin, macrocrystal-monohydrate, (MACROBID) 100 MG capsule Take 1 capsule (100 mg total) by mouth 2 (two) times daily. X 7 days (Patient not taking: Reported on 11/03/2017)  . ondansetron (ZOFRAN) 4 MG tablet Take 1 tablet (4 mg total) by mouth every 8 (eight) hours as needed for nausea or vomiting. (Patient not taking: Reported on 11/03/2017)  . sitaGLIPtin (JANUVIA) 25 MG tablet Take 25 mg daily by mouth.   No facility-administered encounter medications on file as of 11/03/2017.     Activities of Daily Living In your present state of health, do you have any difficulty performing the following activities: 11/03/2017  Hearing? N  Vision? N  Difficulty concentrating or making decisions? N  Walking or climbing stairs? N  Dressing or bathing? N  Doing errands, shopping? N  Preparing Food and eating ? N  Using the Toilet? N  In the past six months, have you accidently leaked urine? Y  Do you have problems with loss of bowel control? N  Managing your Medications? N  Managing your Finances? N  Housekeeping or managing your Housekeeping? N  Some recent data might be hidden    Patient Care Team: WBriscoe Deutscher DO as PCP - General (Family Medicine) BGaye Pollack MD as Consulting Physician (Cardiothoracic Surgery)    Assessment:    Physical assessment deferred to PCP.  Exercise Activities and Dietary recommendations Current Exercise Habits: The patient does not participate in regular exercise at present  Goals    None     Fall Risk Fall Risk  11/03/2017  Falls in the past year? Yes  Number falls in past yr: 1  Injury with Fall? No   Depression Screen PHQ 2/9 Scores 11/03/2017  PHQ - 2 Score 0     Cognitive Function        Immunization History  Administered Date(s) Administered  . Influenza, High Dose Seasonal PF 09/10/2017   Screening Tests Health Maintenance  Topic Date Due  . FOOT EXAM  07/24/1931  . OPHTHALMOLOGY EXAM  07/24/1931  .  TETANUS/TDAP  07/23/1940  . DEXA SCAN  07/23/1986  . PNA vac Low Risk Adult (1 of 2 - PCV13) 07/23/1986  . HEMOGLOBIN A1C  03/22/2018  . URINE MICROALBUMIN  04/11/2018  . INFLUENZA VACCINE  Completed      Plan:   Follow up with PCP as directed.  I have personally reviewed and noted the following in the patient's chart:   . Medical and social history . Use of alcohol, tobacco or illicit drugs  . Current medications and supplements . Functional ability and status . Nutritional  status . Physical activity . Advanced directives . List of other physicians . Vitals . Screenings to include cognitive, depression, and falls . Referrals and appointments  In addition, I have reviewed and discussed with patient certain preventive protocols, quality metrics, and best practice recommendations. A written personalized care plan for preventive services as well as general preventive health recommendations were provided to patient.     Williemae Area, RN  11/03/2017

## 2017-11-03 NOTE — Progress Notes (Addendum)
[name] is a 83 y.o. female is here for follow up.  History of Present Illness:   HPI: See Assessment and Plan section for Problem Based Charting of issues discussed today.  Health Maintenance Due  Topic Date Due  . OPHTHALMOLOGY EXAM  07/24/1931  . TETANUS/TDAP  07/23/1940  . DEXA SCAN  07/23/1986  . PNA vac Low Risk Adult (1 of 2 - PCV13) 07/23/1986   Depression screen PHQ 2/9 11/03/2017  Decreased Interest 0  Down, Depressed, Hopeless 0  PHQ - 2 Score 0   PMHx, SurgHx, SocialHx, FamHx, Medications, and Allergies were reviewed in the Visit Navigator and updated as appropriate.   Patient Active Problem List   Diagnosis Date Noted  . Dry skin dermatitis 09/24/2017  . Osteoarthritis 07/31/2017  . CKD stage 4 due to type 2 diabetes mellitus ([place]) 04/19/2017  . Iron deficiency anemia 04/19/2017  . B12 deficiency 04/08/2017  . Chronic constipation 04/08/2017  . Chronic deep vein thrombosis (DVT) of left lower extremity ([place]) 04/08/2017  . Episode of recurrent major depressive disorder ([place]) 04/08/2017  . Monoclonal gammopathy of unknown significance (MGUS) 04/08/2017  . Peripheral neuropathy 04/08/2017  . Rectal mass   . Pancreas divisum 03/02/2015  . Physical deconditioning 03/01/2015  . Pancreatic cyst   . Hiatal hernia- moderate to large 10/06/2014  . Heme + stool 10/04/2014  . Crohn's ileocolitis ([place]) 10/04/2014  . 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 Arthritis   . Hx pulmonary embolism   . Hypothyroidism 04/11/2014  . Type 2 diabetes mellitus with diabetic polyneuropathy, without long-term current use of insulin ([place]) 04/11/2014  . Anemia of chronic disease 04/03/2014  . OAB (overactive bladder) 01/07/2012  . Restless leg syndrome 03/09/2008  . Benign essential hypertension 03/09/2008  . ESOPHAGEAL STRICTURE 03/09/2008  . Diverticulosis of large intestine 03/09/2008  . Polymyalgia rheumatica ([place]) 03/09/2008   Social History   Tobacco Use  . Smoking status:  Never Smoker  . Smokeless tobacco: Never Used  Substance Use Topics  . Alcohol use: No  . Drug use: No   Current Medications and Allergies:   .  apixaban (ELIQUIS) 2.5 MG TABS tablet, Take 1 tablet (2.5 mg total) by mouth 2 (two) times daily., Disp: 60 tablet .  furosemide (LASIX) 20 MG tablet, Take 20 mg by mouth daily., Disp: , Rfl:  .  nitrofurantoin, macrocrystal-monohydrate, (MACROBID) 100 MG capsule, Take 1 capsule (100 mg total) by mouth 2 (two) times daily. X 7 days (Patient not taking: Reported on 11/03/2017), Disp: 14 capsule, Rfl: 0 .  ondansetron (ZOFRAN ODT) 4 MG disintegrating tablet, 59m ODT q4 hours prn nausea/vomit, Disp: 20 tablet, Rfl: 0 .  ondansetron (ZOFRAN) 4 MG tablet, Take 1 tablet (4 mg total) by mouth every 8 (eight) hours as needed for nausea or vomiting. (Patient not taking: Reported on 11/03/2017), Disp: 12 tablet, Rfl: 0 .  pantoprazole (PROTONIX) 40 MG tablet, Take 40 mg by mouth daily., Disp: , Rfl:  .  ropinirole (REQUIP) 5 MG tablet, Take 5 mg by mouth at bedtime., Disp: , Rfl:  .  triamcinolone ointment (KENALOG) 0.5 %, Apply 1 application topically 2 (two) times daily. (Patient taking differently: Apply 1 application 2 (two) times daily as needed topically (irritation,rash). ), Disp: 30 g, Rfl: 0   Allergies  Allergen Reactions  . Sulfa Antibiotics Nausea And Vomiting  . Penicillins Hives and Rash    Has patient had a PCN reaction causing immediate rash, facial/tongue/throat swelling, SOB or lightheadedness with  hypotension: Unknown Has patient had a PCN reaction causing severe rash involving mucus membranes or skin necrosis: Unknown Has patient had a PCN reaction that required hospitalization Unknown Has patient had a PCN reaction occurring within the last 10 years: No If all of the above answers are "NO", then may proceed with Cephalosporin use.    Review of Systems   Pertinent items are noted in the HPI. Otherwise, ROS is negative.  Vitals:    Vitals:   11/03/17 1343  BP: 110/64  Pulse: 95  Temp: (!) 97.4 F (36.3 C)  TempSrc: Oral  SpO2: 95%  Weight: 123 lb 12.1 oz (56.1 kg)  Height: 5' 1"  (1.549 m)     Body mass index is 23.38 kg/m.   Physical Exam:   Physical Exam  Constitutional: She appears well-nourished.  HENT:  Head: Normocephalic and atraumatic.  Eyes: EOM are normal. Pupils are equal, round, and reactive to light.  Neck: Normal range of motion. Neck supple.  Cardiovascular: Normal rate, regular rhythm, normal heart sounds and intact distal pulses.  Pulmonary/Chest: Effort normal.  Abdominal: Soft.  Skin: Skin is warm.  Psychiatric: She has a normal mood and affect. Her behavior is normal.  Nursing note and vitals reviewed.   Results for orders placed or performed during the hospital encounter of 11/01/17  Urine culture  Result Value Ref Range   Specimen Description URINE, CATHETERIZED    Special Requests NONE    Culture >=100,000 COLONIES/mL GRAM NEGATIVE RODS (A)    Report Status PENDING   CBC with Differential  Result Value Ref Range   WBC 4.8 4.0 - 10.5 K/uL   RBC 3.89 3.87 - 5.11 MIL/uL   Hemoglobin 12.0 12.0 - 15.0 g/dL   HCT 36.3 36.0 - 46.0 %   MCV 93.3 78.0 - 100.0 fL   MCH 30.8 26.0 - 34.0 pg   MCHC 33.1 30.0 - 36.0 g/dL   RDW 13.7 11.5 - 15.5 %   Platelets 192 150 - 400 K/uL   Neutrophils Relative % 53 %   Neutro Abs 2.5 1.7 - 7.7 K/uL   Lymphocytes Relative 35 %   Lymphs Abs 1.7 0.7 - 4.0 K/uL   Monocytes Relative 8 %   Monocytes Absolute 0.4 0.1 - 1.0 K/uL   Eosinophils Relative 3 %   Eosinophils Absolute 0.2 0.0 - 0.7 K/uL   Basophils Relative 1 %   Basophils Absolute 0.0 0.0 - 0.1 K/uL  Comprehensive metabolic panel  Result Value Ref Range   Sodium 138 135 - 145 mmol/L   Potassium 4.1 3.5 - 5.1 mmol/L   Chloride 106 101 - 111 mmol/L   CO2 24 22 - 32 mmol/L   Glucose, Bld 86 65 - 99 mg/dL   BUN 22 (H) 6 - 20 mg/dL   Creatinine, Ser 1.27 (H) 0.44 - 1.00 mg/dL    Calcium 8.9 8.9 - 10.3 mg/dL   Total Protein 6.4 (L) 6.5 - 8.1 g/dL   Albumin 3.5 3.5 - 5.0 g/dL   AST 25 15 - 41 U/L   ALT 13 (L) 14 - 54 U/L   Alkaline Phosphatase 115 38 - 126 U/L   Total Bilirubin 1.1 0.3 - 1.2 mg/dL   GFR calc non Af Amer 34 (L) >60 mL/min   GFR calc Af Amer 40 (L) >60 mL/min   Anion gap 8 5 - 15  Lipase, blood  Result Value Ref Range   Lipase 23 11 - 51 U/L  Urinalysis, Routine w  reflex microscopic  Result Value Ref Range   Color, Urine YELLOW YELLOW   APPearance CLEAR CLEAR   Specific Gravity, Urine 1.012 1.005 - 1.030   pH 5.0 5.0 - 8.0   Glucose, UA NEGATIVE NEGATIVE mg/dL   Hgb urine dipstick SMALL (A) NEGATIVE   Bilirubin Urine NEGATIVE NEGATIVE   Ketones, ur 20 (A) NEGATIVE mg/dL   Protein, ur NEGATIVE NEGATIVE mg/dL   Nitrite POSITIVE (A) NEGATIVE   Leukocytes, UA MODERATE (A) NEGATIVE   RBC / HPF 0-5 0 - 5 RBC/hpf   WBC, UA 6-30 0 - 5 WBC/hpf   Bacteria, UA MANY (A) NONE SEEN   Squamous Epithelial / LPF 0-5 (A) NONE SEEN   Mucus PRESENT   Troponin I  Result Value Ref Range   Troponin I 0.03 (HH) <0.03 ng/mL   Assessment and Plan:   Diagnoses and all orders for this visit:  Acute cystitis with hematuria Comments: Patient was recently in the emergency room.  She was diagnosed with a UTI and prescribed Macrobid.  Unfortunately, she has not picked up the medication.  She is struggling with help at home.  Her son is estranged at the moment.  She does have a friend that is back in town from the holidays and helping her today.  Type 2 diabetes mellitus with stage 3 chronic kidney disease, unspecified whether long term insulin use (HCC) Comments: Current symptoms: no chest pain, dyspnea or TIA's, no numbness, tingling or pain in extremities.   Episodes of hypoglycemia? []   YES  [x]   NO Maintaining a diabetic diet? []   YES  [x]   NO Trying to exercise on a regular basis? []   YES  [x]   NO  On ACE inhibitor or angiotensin II receptor blocker? []    YES  [x]   NO On Aspirin? On Eliquis  Lab Results  Component Value Date   HGBA1C 6.9 (H) 09/21/2017    No results found for: Derl Barrow  Lab Results  Component Value Date   CHOL 133 02/18/2013   HDL 47 02/18/2013   LDLCALC 70 02/18/2013   TRIG 78 02/18/2013   CHOLHDL 2.8 02/18/2013     Wt Readings from Last 3 Encounters:  11/03/17 123 lb 12.1 oz (56.1 kg)  11/03/17 123 lb 12.8 oz (56.2 kg)  10/24/17 120 lb (54.4 kg)   BP Readings from Last 3 Encounters:  11/03/17 110/64  11/03/17 110/64  11/01/17 (!) 162/68   Lab Results  Component Value Date   CREATININE 1.27 (H) 11/01/2017   Orders: -     POCT glucose (manual entry)  CKD stage 4 due to type 2 diabetes mellitus (HCC) Comments:  Lab Results  Component Value Date   CREATININE 1.27 (H) 11/01/2017   CREATININE 1.20 (H) 10/24/2017   CREATININE 1.13 (H) 10/24/2017   Blurred vision Comments: The patient under endorses some blurry vision over the past week.  We note that she is sometimes closing her right eye during her visit today.  She denies any double vision.  She has not had her eyes checked in quite some time.  We will sent her to the diabetes specialist.  Referral in place. Orders: -     Ambulatory referral to Ophthalmology  Difficulty walking Comments: Patient is struggling at home.  She admits to having more difficulty walking lately.  She feels more weak.  She denies any focal weakness or paresthesias.  We will see as home health physical therapy and RN available to this patient.  If  she continues to struggle at home, we will need to have a discussion regarding safety and possible placement. Orders: -     Ambulatory referral to Physical Therapy -     Ambulatory referral to [facility]   . Reviewed expectations re: course of current medical issues. . Discussed self-management of symptoms. . Outlined signs and symptoms indicating need for more acute intervention. . Patient verbalized understanding  and all questions were answered. Marland Kitchen Health Maintenance issues including appropriate healthy diet, exercise, and smoking avoidance were discussed with patient. . See orders for this visit as documented in the electronic medical record. . Patient received an After Visit Summary.  Briscoe Deutscher, DO [place] [place] 11/08/2017  No future appointments.

## 2017-11-03 NOTE — Progress Notes (Signed)
PCP notes:   Health maintenance: Foot exam: Completed today Ophthalmology: Pt is overdue, she will call and schedue  Tdap: Due Dexa: Due. PCV13: Due.  Abnormal screenings: Foot exam.   Patient concerns: Right eye not able to focus.   Nurse concerns: Pt has not started antibiotics for UTI.   Next PCP appt: 11/03/17 1:40

## 2017-11-03 NOTE — Progress Notes (Signed)
Pre visit review using our clinic review tool, if applicable. No additional management support is needed unless otherwise documented below in the visit note. 

## 2017-11-03 NOTE — Patient Instructions (Signed)
[name] , Thank you for taking time to come for your Medicare Wellness Visit. I appreciate your ongoing commitment to your health goals. Please review the following plan we discussed and let me know if I can assist you in the future.   These are the goals we discussed: Goals    None      This is a list of the screening recommended for you and due dates:  Health Maintenance  Topic Date Due  . Complete foot exam   07[birth year]31  . Eye exam for diabetics  07[birth year]24[birth year]1931  . Tetanus Vaccine  07/23/1940  . DEXA scan (bone density measurement)  07/23/1986  . Pneumonia vaccines (1 of 2 - PCV13) 07/23/1986  . Hemoglobin A1C  03/22/2018  . Urine Protein Check  04/11/2018  . Flu Shot  Completed   Preventive Care for Adults  A healthy lifestyle and preventive care can promote health and wellness. Preventive health guidelines for adults include the following key practices.  . A routine yearly physical is a good way to check with your health care provider about your health and preventive screening. It is a chance to share any concerns and updates on your health and to receive a thorough exam.  . Visit your dentist for a routine exam and preventive care every 6 months. Brush your teeth twice a day and floss once a day. Good oral hygiene prevents tooth decay and gum disease.  . The frequency of eye exams is based on your age, health, family medical history, use  of contact lenses, and other factors. Follow your health care provider's recommendations for frequency of eye exams.  . Eat a healthy diet. Foods like vegetables, fruits, whole grains, low-fat dairy products, and lean protein foods contain the nutrients you need without too many calories. Decrease your intake of foods high in solid fats, added sugars, and salt. Eat the right amount of calories for you. Get information about a proper diet from your health care provider, if necessary.  . Regular physical exercise is one of the most important  things you can do for your health. Most adults should get at least 150 minutes of moderate-intensity exercise (any activity that increases your heart rate and causes you to sweat) each week. In addition, most adults need muscle-strengthening exercises on 2 or more days a week.  Silver Sneakers may be a benefit available to you. To determine eligibility, you may visit the website: www.silversneakers.com or contact program at 774-873-9697 Mon-Fri between 8AM-8PM.   . Maintain a healthy weight. The body mass index (BMI) is a screening tool to identify possible weight problems. It provides an estimate of body fat based on height and weight. Your health care provider can find your BMI and can help you achieve or maintain a healthy weight.   For adults 20 years and older: ? A BMI below 18.5 is considered underweight. ? A BMI of 18.5 to 24.9 is normal. ? A BMI of 25 to 29.9 is considered overweight. ? A BMI of 30 and above is considered obese.   . Maintain normal blood lipids and cholesterol levels by exercising and minimizing your intake of saturated fat. Eat a balanced diet with plenty of fruit and vegetables. Blood tests for lipids and cholesterol should begin at age 44 and be repeated every 5 years. If your lipid or cholesterol levels are high, you are over 50, or you are at high risk for heart disease, you may need your cholesterol levels checked more frequently.  Ongoing high lipid and cholesterol levels should be treated with medicines if diet and exercise are not working.  . If you smoke, find out from your health care provider how to quit. If you do not use tobacco, please do not start.  . If you choose to drink alcohol, please do not consume more than 2 drinks per day. One drink is considered to be 12 ounces (355 mL) of beer, 5 ounces (148 mL) of wine, or 1.5 ounces (44 mL) of liquor.  . If you are 90-43 years old, ask your health care provider if you should take aspirin to prevent  strokes.  . Use sunscreen. Apply sunscreen liberally and repeatedly throughout the day. You should seek shade when your shadow is shorter than you. Protect yourself by wearing long sleeves, pants, a wide-brimmed hat, and sunglasses year round, whenever you are outdoors.  . Once a month, do a whole body skin exam, using a mirror to look at the skin on your back. Tell your health care provider of new moles, moles that have irregular borders, moles that are larger than a pencil eraser, or moles that have changed in shape or color.

## 2017-11-04 LAB — URINE CULTURE

## 2017-11-04 NOTE — Progress Notes (Signed)
I have personally reviewed the Medicare Annual Wellness questionnaire and have noted 1. The patient's medical and social history 2. Their use of alcohol, tobacco or illicit drugs 3. Their current medications and supplements 4. The patient's functional ability including ADL's, fall risks, home safety risks and hearing or visual impairment. 5. Diet and physical activities 6. Evidence for depression or mood disorders 7. Reviewed Updated provider list, see scanned forms and [facility] Snapshot.   The patients weight, height, BMI and visual acuity have been recorded in the chart I have made referrals, counseling and provided education to the patient based review of the above and I have provided the pt with a written personalized care plan for preventive services.  I have provided the patient with a copy of your personalized plan for preventive services. Instructed to take the time to review along with their updated medication list.  [name]  

## 2017-11-05 ENCOUNTER — Telehealth: Payer: Self-pay | Admitting: *Deleted

## 2017-11-05 NOTE — Telephone Encounter (Signed)
Post ED Visit - Positive Culture Follow-up  Culture report reviewed by antimicrobial stewardship pharmacist:  []  Elenor Quinones, Pharm.D. []  Heide Guile, Pharm.D., BCPS AQ-ID []  Parks Neptune, Pharm.D., BCPS []  Alycia Rossetti, Pharm.D., BCPS []  [place], Pharm.D., BCPS, AAHIVP []  Legrand Como, Pharm.D., BCPS, AAHIVP []  Salome Arnt, PharmD, BCPS []  Dimitri Ped, PharmD, BCPS []  Vincenza Hews, PharmD, BCPS  Positive urine culture, reviewed by Arlean Hopping, PA-C Treated with Macrobid and advised to discontinue due to age and renal function.  States she has followed up with PCP due to lack of improvement in her symptoms and is currently on antibiotics her PCP prescribed.  Harlon Flor [facility] 11/05/2017, 1:03 PM

## 2017-11-05 NOTE — Progress Notes (Signed)
ED Antimicrobial Stewardship Positive Culture Follow Up   [name] is an 83 y.o. female who presented to [facility] on 11/01/2017 with a chief complaint of:  Chief Complaint  Patient presents with  . Emesis    Recent Results (from the past 720 hour(s))  Urine culture     Status: Abnormal   Collection Time: 11/01/17  9:50 AM  Result Value Ref Range Status   Specimen Description URINE, CATHETERIZED  Final   Special Requests NONE  Final   Culture >=100,000 COLONIES/mL KLEBSIELLA PNEUMONIAE (A)  Final   Report Status 11/04/2017 FINAL  Final   Organism ID, Bacteria KLEBSIELLA PNEUMONIAE (A)  Final      Susceptibility   Klebsiella pneumoniae - MIC*    AMPICILLIN RESISTANT Resistant     CEFAZOLIN <=4 SENSITIVE Sensitive     CEFTRIAXONE <=1 SENSITIVE Sensitive     CIPROFLOXACIN <=0.25 SENSITIVE Sensitive     GENTAMICIN <=1 SENSITIVE Sensitive     IMIPENEM <=0.25 SENSITIVE Sensitive     NITROFURANTOIN <=16 SENSITIVE Sensitive     TRIMETH/SULFA <=20 SENSITIVE Sensitive     AMPICILLIN/SULBACTAM <=2 SENSITIVE Sensitive     PIP/TAZO <=4 SENSITIVE Sensitive     Extended ESBL NEGATIVE Sensitive     * >=100,000 COLONIES/mL KLEBSIELLA PNEUMONIAE   [name] presented with epigastric pain and emesis, treated with pepcid and found relief. Patient did not exhibit any urinary symptoms and was discharged on Macrobid 100 mg BID for 7 days. Antibiotic inappropriate for patient due to indication, age and renal function.  Plan: Call patient for symptom check and discontinue Macrobid therapy.   ED Provider: Arlean Hopping, PA-C   Bridgett Larsson 11/05/2017, 9:28 AM Infectious Diseases Pharmacist Phone# [phone]

## 2017-11-08 ENCOUNTER — Encounter: Payer: Self-pay | Admitting: Family Medicine

## 2017-11-20 ENCOUNTER — Telehealth: Payer: Self-pay | Admitting: Family Medicine

## 2017-11-20 NOTE — Telephone Encounter (Signed)
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 Please call to advise further if necessary.

## 2017-11-22 ENCOUNTER — Inpatient Hospital Stay (HOSPITAL_COMMUNITY)
Admission: EM | Admit: 2017-11-22 | Discharge: 2017-11-24 | DRG: 690 | Disposition: A | Discharge status: Home Health | Payer: Medicare Other | Attending: Internal Medicine | Admitting: Internal Medicine

## 2017-11-22 ENCOUNTER — Encounter (HOSPITAL_COMMUNITY): Payer: Self-pay

## 2017-11-22 ENCOUNTER — Other Ambulatory Visit: Payer: Self-pay

## 2017-11-22 ENCOUNTER — Inpatient Hospital Stay (HOSPITAL_COMMUNITY): Payer: Medicare Other

## 2017-11-22 DIAGNOSIS — D631 Anemia in chronic kidney disease: Secondary | ICD-10-CM | POA: Diagnosis present

## 2017-11-22 DIAGNOSIS — N3000 Acute cystitis without hematuria: Secondary | ICD-10-CM | POA: Diagnosis not present

## 2017-11-22 DIAGNOSIS — Q453 Other congenital malformations of pancreas and pancreatic duct: Secondary | ICD-10-CM

## 2017-11-22 DIAGNOSIS — K529 Noninfective gastroenteritis and colitis, unspecified: Secondary | ICD-10-CM | POA: Diagnosis present

## 2017-11-22 DIAGNOSIS — B961 Klebsiella pneumoniae [K. pneumoniae] as the cause of diseases classified elsewhere: Secondary | ICD-10-CM | POA: Diagnosis present

## 2017-11-22 DIAGNOSIS — Z86718 Personal history of other venous thrombosis and embolism: Secondary | ICD-10-CM

## 2017-11-22 DIAGNOSIS — K5730 Diverticulosis of large intestine without perforation or abscess without bleeding: Secondary | ICD-10-CM | POA: Diagnosis present

## 2017-11-22 DIAGNOSIS — Z8639 Personal history of other endocrine, nutritional and metabolic disease: Secondary | ICD-10-CM

## 2017-11-22 DIAGNOSIS — I129 Hypertensive chronic kidney disease with stage 1 through stage 4 chronic kidney disease, or unspecified chronic kidney disease: Secondary | ICD-10-CM | POA: Diagnosis present

## 2017-11-22 DIAGNOSIS — I482 Chronic atrial fibrillation, unspecified: Secondary | ICD-10-CM | POA: Diagnosis present

## 2017-11-22 DIAGNOSIS — Z8719 Personal history of other diseases of the digestive system: Secondary | ICD-10-CM

## 2017-11-22 DIAGNOSIS — F329 Major depressive disorder, single episode, unspecified: Secondary | ICD-10-CM | POA: Diagnosis present

## 2017-11-22 DIAGNOSIS — Z8619 Personal history of other infectious and parasitic diseases: Secondary | ICD-10-CM | POA: Diagnosis not present

## 2017-11-22 DIAGNOSIS — Z66 Do not resuscitate: Secondary | ICD-10-CM | POA: Diagnosis present

## 2017-11-22 DIAGNOSIS — R531 Weakness: Secondary | ICD-10-CM | POA: Diagnosis present

## 2017-11-22 DIAGNOSIS — I10 Essential (primary) hypertension: Secondary | ICD-10-CM

## 2017-11-22 DIAGNOSIS — Z86711 Personal history of pulmonary embolism: Secondary | ICD-10-CM

## 2017-11-22 DIAGNOSIS — G4733 Obstructive sleep apnea (adult) (pediatric): Secondary | ICD-10-CM | POA: Diagnosis present

## 2017-11-22 DIAGNOSIS — N183 Chronic kidney disease, stage 3 unspecified: Secondary | ICD-10-CM | POA: Diagnosis present

## 2017-11-22 DIAGNOSIS — I480 Paroxysmal atrial fibrillation: Secondary | ICD-10-CM | POA: Diagnosis present

## 2017-11-22 DIAGNOSIS — R32 Unspecified urinary incontinence: Secondary | ICD-10-CM | POA: Diagnosis present

## 2017-11-22 DIAGNOSIS — E860 Dehydration: Secondary | ICD-10-CM | POA: Diagnosis present

## 2017-11-22 DIAGNOSIS — G2581 Restless legs syndrome: Secondary | ICD-10-CM | POA: Diagnosis present

## 2017-11-22 DIAGNOSIS — E119 Type 2 diabetes mellitus without complications: Secondary | ICD-10-CM | POA: Diagnosis not present

## 2017-11-22 DIAGNOSIS — R197 Diarrhea, unspecified: Secondary | ICD-10-CM | POA: Diagnosis not present

## 2017-11-22 DIAGNOSIS — E1122 Type 2 diabetes mellitus with diabetic chronic kidney disease: Secondary | ICD-10-CM | POA: Diagnosis present

## 2017-11-22 DIAGNOSIS — Z981 Arthrodesis status: Secondary | ICD-10-CM

## 2017-11-22 DIAGNOSIS — K219 Gastro-esophageal reflux disease without esophagitis: Secondary | ICD-10-CM | POA: Diagnosis present

## 2017-11-22 DIAGNOSIS — R109 Unspecified abdominal pain: Secondary | ICD-10-CM | POA: Diagnosis not present

## 2017-11-22 DIAGNOSIS — E440 Moderate protein-calorie malnutrition: Secondary | ICD-10-CM | POA: Diagnosis present

## 2017-11-22 DIAGNOSIS — Z1611 Resistance to penicillins: Secondary | ICD-10-CM | POA: Diagnosis present

## 2017-11-22 DIAGNOSIS — D638 Anemia in other chronic diseases classified elsewhere: Secondary | ICD-10-CM | POA: Diagnosis present

## 2017-11-22 DIAGNOSIS — K5090 Crohn's disease, unspecified, without complications: Secondary | ICD-10-CM | POA: Diagnosis present

## 2017-11-22 DIAGNOSIS — R112 Nausea with vomiting, unspecified: Secondary | ICD-10-CM | POA: Diagnosis present

## 2017-11-22 DIAGNOSIS — Z6821 Body mass index (BMI) 21.0-21.9, adult: Secondary | ICD-10-CM

## 2017-11-22 DIAGNOSIS — I452 Bifascicular block: Secondary | ICD-10-CM | POA: Diagnosis present

## 2017-11-22 DIAGNOSIS — M353 Polymyalgia rheumatica: Secondary | ICD-10-CM | POA: Diagnosis present

## 2017-11-22 DIAGNOSIS — I82409 Acute embolism and thrombosis of unspecified deep veins of unspecified lower extremity: Secondary | ICD-10-CM | POA: Diagnosis not present

## 2017-11-22 DIAGNOSIS — N390 Urinary tract infection, site not specified: Secondary | ICD-10-CM | POA: Diagnosis present

## 2017-11-22 DIAGNOSIS — O2230 Deep phlebothrombosis in pregnancy, unspecified trimester: Secondary | ICD-10-CM

## 2017-11-22 DIAGNOSIS — Z880 Allergy status to penicillin: Secondary | ICD-10-CM

## 2017-11-22 DIAGNOSIS — Z7984 Long term (current) use of oral hypoglycemic drugs: Secondary | ICD-10-CM

## 2017-11-22 DIAGNOSIS — F32A Depression, unspecified: Secondary | ICD-10-CM | POA: Diagnosis present

## 2017-11-22 DIAGNOSIS — Z7901 Long term (current) use of anticoagulants: Secondary | ICD-10-CM

## 2017-11-22 DIAGNOSIS — Z79899 Other long term (current) drug therapy: Secondary | ICD-10-CM

## 2017-11-22 DIAGNOSIS — Z882 Allergy status to sulfonamides status: Secondary | ICD-10-CM

## 2017-11-22 HISTORY — DX: Deep phlebothrombosis in pregnancy, unspecified trimester: O22.30

## 2017-11-22 LAB — URINALYSIS, ROUTINE W REFLEX MICROSCOPIC
Bilirubin Urine: NEGATIVE
GLUCOSE, UA: NEGATIVE mg/dL
Ketones, ur: NEGATIVE mg/dL
NITRITE: POSITIVE — AB
Protein, ur: NEGATIVE mg/dL
SPECIFIC GRAVITY, URINE: 1.013 (ref 1.005–1.030)
Squamous Epithelial / LPF: NONE SEEN
pH: 6 (ref 5.0–8.0)

## 2017-11-22 LAB — COMPREHENSIVE METABOLIC PANEL
ALT: 13 U/L — ABNORMAL LOW (ref 14–54)
ANION GAP: 8 (ref 5–15)
AST: 26 U/L (ref 15–41)
Albumin: 3.2 g/dL — ABNORMAL LOW (ref 3.5–5.0)
Alkaline Phosphatase: 110 U/L (ref 38–126)
BILIRUBIN TOTAL: 0.9 mg/dL (ref 0.3–1.2)
BUN: 29 mg/dL — AB (ref 6–20)
CO2: 27 mmol/L (ref 22–32)
Calcium: 9 mg/dL (ref 8.9–10.3)
Chloride: 106 mmol/L (ref 101–111)
Creatinine, Ser: 1.38 mg/dL — ABNORMAL HIGH (ref 0.44–1.00)
GFR, EST AFRICAN AMERICAN: 36 mL/min — AB (ref >60)
GFR, EST NON AFRICAN AMERICAN: 31 mL/min — AB (ref >60)
Glucose, Bld: 125 mg/dL — ABNORMAL HIGH (ref 65–99)
POTASSIUM: 4.2 mmol/L (ref 3.5–5.1)
Sodium: 141 mmol/L (ref 135–145)
TOTAL PROTEIN: 6.1 g/dL — AB (ref 6.5–8.1)

## 2017-11-22 LAB — CBC
HEMATOCRIT: 33.7 % — AB (ref 36.0–46.0)
Hemoglobin: 10.8 g/dL — ABNORMAL LOW (ref 12.0–15.0)
MCH: 29.9 pg (ref 26.0–34.0)
MCHC: 32 g/dL (ref 30.0–36.0)
MCV: 93.4 fL (ref 78.0–100.0)
PLATELETS: 228 10*3/uL (ref 150–400)
RBC: 3.61 MIL/uL — AB (ref 3.87–5.11)
RDW: 13.4 % (ref 11.5–15.5)
WBC: 6.3 10*3/uL (ref 4.0–10.5)

## 2017-11-22 LAB — GLUCOSE, CAPILLARY
GLUCOSE-CAPILLARY: 98 mg/dL (ref 65–99)
Glucose-Capillary: 87 mg/dL (ref 65–99)
Glucose-Capillary: 107 mg/dL — ABNORMAL HIGH (ref 65–99)

## 2017-11-22 LAB — LIPASE, BLOOD: LIPASE: 32 U/L (ref 11–51)

## 2017-11-22 MED ORDER — MORPHINE SULFATE (PF) 4 MG/ML IV SOLN: 0.5000 mg | INTRAVENOUS | Status: DC | PRN

## 2017-11-22 MED ORDER — SODIUM CHLORIDE 0.9 % IV SOLN
1.0000 g | INTRAVENOUS | Status: DC
  Filled 2017-11-22: qty 1

## 2017-11-22 MED ORDER — CEFTRIAXONE SODIUM 2 G IJ SOLR
2.0000 g | INTRAMUSCULAR | Status: DC
  Administered 2017-11-22 – 2017-11-23 (×2): 2 g via INTRAVENOUS
  Filled 2017-11-22 (×3): qty 2

## 2017-11-22 MED ORDER — ONDANSETRON HCL 4 MG/2ML IJ SOLN
4.0000 mg | Freq: Three times a day (TID) | INTRAMUSCULAR | Status: DC | PRN
  Administered 2017-11-23: 4 mg via INTRAVENOUS
  Filled 2017-11-22: qty 2

## 2017-11-22 MED ORDER — SODIUM CHLORIDE 0.9 % IV BOLUS (SEPSIS)
500.0000 mL | Freq: Once | INTRAVENOUS | Status: AC
  Administered 2017-11-22: 500 mL via INTRAVENOUS

## 2017-11-22 MED ORDER — ONDANSETRON HCL 4 MG/2ML IJ SOLN
4.0000 mg | Freq: Once | INTRAMUSCULAR | Status: AC
  Administered 2017-11-22: 4 mg via INTRAVENOUS
  Filled 2017-11-22: qty 2

## 2017-11-22 MED ORDER — INSULIN ASPART 100 UNIT/ML ~~LOC~~ SOLN: 0.0000 [IU] | Freq: Every day | SUBCUTANEOUS | Status: DC

## 2017-11-22 MED ORDER — ACETAMINOPHEN 650 MG RE SUPP: 650.0000 mg | Freq: Four times a day (QID) | RECTAL | Status: DC | PRN

## 2017-11-22 MED ORDER — METRONIDAZOLE IN NACL 5-0.79 MG/ML-% IV SOLN
500.0000 mg | Freq: Three times a day (TID) | INTRAVENOUS | Status: DC
  Administered 2017-11-22 – 2017-11-24 (×5): 500 mg via INTRAVENOUS
  Filled 2017-11-22 (×7): qty 100

## 2017-11-22 MED ORDER — MORPHINE SULFATE (PF) 2 MG/ML IV SOLN: 0.5000 mg | INTRAVENOUS | Status: DC | PRN

## 2017-11-22 MED ORDER — ROPINIROLE HCL 1 MG PO TABS
5.0000 mg | ORAL_TABLET | Freq: Every day | ORAL | Status: DC
  Administered 2017-11-22 – 2017-11-23 (×2): 5 mg via ORAL
  Filled 2017-11-22 (×2): qty 5

## 2017-11-22 MED ORDER — ACETAMINOPHEN 325 MG PO TABS
650.0000 mg | ORAL_TABLET | Freq: Four times a day (QID) | ORAL | Status: DC | PRN
  Administered 2017-11-24: 650 mg via ORAL
  Filled 2017-11-22: qty 2

## 2017-11-22 MED ORDER — APIXABAN 2.5 MG PO TABS
2.5000 mg | ORAL_TABLET | Freq: Two times a day (BID) | ORAL | Status: DC
  Administered 2017-11-22 – 2017-11-24 (×5): 2.5 mg via ORAL
  Filled 2017-11-22 (×5): qty 1

## 2017-11-22 MED ORDER — PANTOPRAZOLE SODIUM 40 MG PO TBEC
40.0000 mg | DELAYED_RELEASE_TABLET | Freq: Every day | ORAL | Status: DC
  Administered 2017-11-22 – 2017-11-24 (×3): 40 mg via ORAL
  Filled 2017-11-22 (×3): qty 1

## 2017-11-22 MED ORDER — TRIAMCINOLONE ACETONIDE 0.5 % EX OINT
1.0000 "application " | TOPICAL_OINTMENT | Freq: Two times a day (BID) | CUTANEOUS | Status: DC
  Administered 2017-11-22: 1 via TOPICAL
  Filled 2017-11-22: qty 15

## 2017-11-22 MED ORDER — SODIUM CHLORIDE 0.9 % IV SOLN
INTRAVENOUS | Status: DC
  Administered 2017-11-22 – 2017-11-23 (×4): via INTRAVENOUS

## 2017-11-22 MED ORDER — LEVOFLOXACIN IN D5W 750 MG/150ML IV SOLN
750.0000 mg | Freq: Once | INTRAVENOUS | Status: AC
  Administered 2017-11-22: 750 mg via INTRAVENOUS
  Filled 2017-11-22: qty 150

## 2017-11-22 MED ORDER — ZOLPIDEM TARTRATE 5 MG PO TABS: 5.0000 mg | ORAL_TABLET | Freq: Every evening | ORAL | Status: DC | PRN

## 2017-11-22 MED ORDER — SODIUM CHLORIDE 0.9 % IV SOLN
500.0000 mg | Freq: Two times a day (BID) | INTRAVENOUS | Status: DC
  Filled 2017-11-22: qty 0.5

## 2017-11-22 MED ORDER — HYDRALAZINE HCL 20 MG/ML IJ SOLN: 5.0000 mg | INTRAMUSCULAR | Status: DC | PRN

## 2017-11-22 MED ORDER — INSULIN ASPART 100 UNIT/ML ~~LOC~~ SOLN: 0.0000 [IU] | Freq: Three times a day (TID) | SUBCUTANEOUS | Status: DC

## 2017-11-22 MED ORDER — CIPROFLOXACIN IN D5W 400 MG/200ML IV SOLN: 400.0000 mg | INTRAVENOUS | Status: DC

## 2017-11-22 NOTE — ED Provider Notes (Signed)
[facility] DEPT Provider Note   CSN: [id number] Arrival date & time: 11/22/17  0058     History   Chief Complaint Chief Complaint  Patient presents with  . Nausea    HPI [name] is a 83 y.o. female.  Patient is a 83 year old female with past medical history of anemia, anxiety, depression, restless legs, diabetes, and hypertension.  She presents today for evaluation of "hurting all over".  She states that she woke this morning not feeling well.  She went and ran errands,then began with pain throughout her body.  She feels nauseated.  She denies any abdominal pain, chest pain, difficulty breathing.  She just reports feeling achy from the "top of her head to the tips of her toes."   The history is provided by the patient.    Past Medical History:  Diagnosis Date  . Acute gastritis without mention of hemorrhage   . Anemia   . Anxiety   . Arthritis    "LLL; palm of right hand" (06/12/2015)  . Atrial fibrillation ([place])   . CAP (community acquired pneumonia) 04/03/2014  . Crohn disease ([place])   . Cyst and pseudocyst of pancreas   . Depression   . Diaphragmatic hernia without mention of obstruction or gangrene   . Diverticulosis of colon (without mention of hemorrhage)   . Duodenitis without mention of hemorrhage   . DVT (deep venous thrombosis) ([place])    "twice on the left leg; once on the right leg" (06/12/2015)  . Dysrhythmia   . Esophageal reflux   . Hiatal hernia- moderate to large 10/06/2014  . Hx pulmonary embolism   . Hypothyroidism   . Insomnia, unspecified   . Left leg DVT ([place]) jan 2013  . Migraine    hx  . OAB (overactive bladder)   . Other and unspecified hyperlipidemia   . Other malaise and fatigue   . Pancreas divisum 03/02/2015  . Pancreatic cyst   . Personal history of unspecified digestive disease   . Polymyalgia rheumatica ([place])   . Protein-calorie malnutrition, severe ([place]) 10/02/2014  . Restless legs syndrome  (RLS)   . Shingles   . Stricture and stenosis of esophagus   . Type II diabetes mellitus ([place])   . Unspecified essential hypertension   . Unspecified sleep apnea    hx of sleep apnea, none since weight loss (06/12/2015)  . Urinary frequency   . Urinary urgency     Patient Active Problem List   Diagnosis Date Noted  . Dry skin dermatitis 09/24/2017  . Osteoarthritis 07/31/2017  . CKD stage 4 due to type 2 diabetes mellitus ([place]) 04/19/2017  . Iron deficiency anemia 04/19/2017  . B12 deficiency 04/08/2017  . Chronic constipation 04/08/2017  . Chronic deep vein thrombosis (DVT) of left lower extremity ([place]) 04/08/2017  . Episode of recurrent major depressive disorder ([place]) 04/08/2017  . Monoclonal gammopathy of unknown significance (MGUS) 04/08/2017  . Peripheral neuropathy 04/08/2017  . Rectal mass   . Pancreas divisum 03/02/2015  . Physical deconditioning 03/01/2015  . Pancreatic cyst   . Hiatal hernia- moderate to large 10/06/2014  . Heme + stool 10/04/2014  . Crohn's ileocolitis ([place]) 10/04/2014  . 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 Arthritis   . Hx pulmonary embolism   . Hypothyroidism 04/11/2014  . Type 2 diabetes mellitus with diabetic polyneuropathy, without long-term current use of insulin ([place]) 04/11/2014  . Anemia of chronic disease 04/03/2014  . OAB (overactive bladder) 01/07/2012  . Restless  leg syndrome 03/09/2008  . Benign essential hypertension 03/09/2008  . ESOPHAGEAL STRICTURE 03/09/2008  . Diverticulosis of large intestine 03/09/2008  . Polymyalgia rheumatica ([place]) 03/09/2008    Past Surgical History:  Procedure Laterality Date  . BACK SURGERY    . CATARACT EXTRACTION, BILATERAL Bilateral   . CLOSED REDUCTION NASAL FRACTURE  08/2008   Archie Endo 04/10/2011  . COLON SURGERY  June 17, 2012   surgery for bleed  . COLONOSCOPY N/A 06/13/2015   Procedure: COLONOSCOPY;  Surgeon: Irene Shipper, MD;  Location: [facility];  Service: Endoscopy;  Laterality: N/A;  .  OOPHORECTOMY  1944   not sure which ovary  . POSTERIOR LUMBAR FUSION  01/2009   L4-L5 Archie Endo 04/09/2011  . REDUCTION MAMMAPLASTY Bilateral 1980  . ROTATOR CUFF REPAIR Right   . SHOULDER OPEN ROTATOR CUFF REPAIR  08/04/2012   Procedure: ROTATOR CUFF REPAIR SHOULDER OPEN;  Surgeon: Tobi Bastos, MD;  Location: [facility];  Service: Orthopedics;  Laterality: Left;  with Anchors and Graft  . SHOULDER OPEN ROTATOR CUFF REPAIR Right 2006   Archie Endo 04/22/2011  . TONSILLECTOMY      OB History    No data available       Home Medications    Prior to Admission medications   Medication Sig Start Date End Date Taking? Authorizing Provider  apixaban (ELIQUIS) 2.5 MG TABS tablet Take 1 tablet (2.5 mg total) by mouth 2 (two) times daily. 10/09/14   Regalado, Belkys A, MD  furosemide (LASIX) 20 MG tablet Take 20 mg by mouth daily.    [provider]  nitrofurantoin, macrocrystal-monohydrate, (MACROBID) 100 MG capsule Take 1 capsule (100 mg total) by mouth 2 (two) times daily. X 7 days Patient not taking: Reported on 11/03/2017 11/01/17   Mesner, Corene Cornea, MD  ondansetron (ZOFRAN ODT) 4 MG disintegrating tablet 33m ODT q4 hours prn nausea/vomit 10/24/17   FDeno Etienne DO  ondansetron (ZOFRAN) 4 MG tablet Take 1 tablet (4 mg total) by mouth every 8 (eight) hours as needed for nausea or vomiting. Patient not taking: Reported on 11/03/2017 11/01/17   Mesner, JCorene Cornea MD  pantoprazole (PROTONIX) 40 MG tablet Take 40 mg by mouth daily.    [provider]  ropinirole (REQUIP) 5 MG tablet Take 5 mg by mouth at bedtime.    [provider]  sitaGLIPtin (JANUVIA) 25 MG tablet Take 25 mg daily by mouth.    [provider]  triamcinolone ointment (KENALOG) 0.5 % Apply 1 application topically 2 (two) times daily. Patient taking differently: Apply 1 application 2 (two) times daily as needed topically (irritation,rash).  09/21/17   WBriscoe Deutscher DO    Family History Family History    Problem Relation Age of Onset  . Melanoma Daughter        died age 81 . Melanoma Son     Social History Social History   Tobacco Use  . Smoking status: Never Smoker  . Smokeless tobacco: Never Used  Substance Use Topics  . Alcohol use: No  . Drug use: No     Allergies   Sulfa antibiotics and Penicillins   Review of Systems Review of Systems  All other systems reviewed and are negative.    Physical Exam Updated Vital Signs BP 123/64 (BP Location: Right Arm)   Pulse (!) 59   Temp 97.8 F (36.6 C) (Oral)   Resp (!) 23   Ht 5' 4"  (1.626 m)   Wt 55.8 kg (123 lb)  SpO2 95%   BMI 21.11 kg/m   Physical Exam  Constitutional: She is oriented to person, place, and time. She appears well-developed and well-nourished. No distress.  HENT:  Head: Normocephalic and atraumatic.  Mouth/Throat: Oropharynx is clear and moist.  Neck: Normal range of motion. Neck supple.  Cardiovascular: Normal rate and regular rhythm. Exam reveals no gallop and no friction rub.  No murmur heard. Pulmonary/Chest: Effort normal and breath sounds normal. No respiratory distress. She has no wheezes.  Abdominal: Soft. Bowel sounds are normal. She exhibits no distension. There is no tenderness.  Musculoskeletal: Normal range of motion.  Neurological: She is alert and oriented to person, place, and time.  Skin: Skin is warm and dry. She is not diaphoretic.  Nursing note and vitals reviewed.    ED Treatments / Results  Labs (all labs ordered are listed, but only abnormal results are displayed) Labs Reviewed  LIPASE, BLOOD  COMPREHENSIVE METABOLIC PANEL  CBC  URINALYSIS, ROUTINE W REFLEX MICROSCOPIC    EKG  EKG Interpretation None       Radiology No results found.  Procedures Procedures (including critical care time)  Medications Ordered in ED Medications - No data to display   Initial Impression / Assessment and Plan / ED Course  I have reviewed the triage vital signs and  the nursing notes.  Pertinent labs & imaging results that were available during my care of the patient were reviewed by me and considered in my medical decision making (see chart for details).  Patient's laboratory studies are reassuring, however urinalysis reveals a urinary tract infection.  This will be treated with Levaquin.  The patient states that she does not feel well enough or safe to return to her current living environment.  I have spoken with Dr. Blaine Hamper who will evaluate for admission.  Final Clinical Impressions(s) / ED Diagnoses   Final diagnoses:  None    ED Discharge Orders    None       Veryl Speak, MD 11/22/17 [phone]

## 2017-11-22 NOTE — ED Notes (Signed)
ED TO INPATIENT HANDOFF REPORT  Name/Age/Gender [name] 83 y.o. female  Code Status    Code Status Orders  (From admission, onward)        Start     Ordered   11/22/17 0523  Do not attempt resuscitation (DNR)  Continuous    Question Answer Comment  In the event of cardiac or respiratory ARREST Do not call a "code blue"   In the event of cardiac or respiratory ARREST Do not perform Intubation, CPR, defibrillation or ACLS   In the event of cardiac or respiratory ARREST Use medication by any route, position, wound care, and other measures to relive pain and suffering. May use oxygen, suction and manual treatment of airway obstruction as needed for comfort.      11/22/17 0523    Code Status History    Date Active Date Inactive Code Status Order ID Comments User Context   12/04/2015 22:39 12/08/2015 16:14 DNR [id number]  Rise Patience, MD Inpatient   12/04/2015 22:05 12/04/2015 22:39 DNR [id number]  Rise Patience, MD Inpatient   06/12/2015 17:12 06/14/2015 18:23 DNR [id number]  Modena Jansky, MD Inpatient   02/28/2015 20:15 03/03/2015 17:56 Full Code [id number]  Orson Eva, MD Inpatient   10/02/2014 00:13 10/09/2014 19:21 Full Code [id number]  Tresa Garter, MD Inpatient   04/03/2014 20:51 04/06/2014 18:23 Full Code [id number]  Robbie Lis, MD Inpatient   02/17/2013 15:47 02/22/2013 19:10 DNR [id number]  Eugenie Filler, MD Inpatient   12/05/2012 15:49 12/09/2012 15:43 DNR [id number]  Earnstine Regal, [facility]   12/05/2012 15:40 12/05/2012 15:49 DNR [id number]  Earnstine Regal, [facility]   08/04/2012 13:50 08/05/2012 17:14 Full Code [id number]  Denton Meek, RN Inpatient      Home/SNF/Other Home  Chief Complaint nausea  Level of Care/Admitting Diagnosis ED Disposition    ED Disposition Condition [facility]: [facility] [[id number]]  Level of Care: Telemetry [5]  Admit to tele based on following criteria: Other see comments   Comments: a fib  Diagnosis: UTI (urinary tract infection) [160109]  Admitting Physician: Ivor Costa [[id number]]  Attending Physician: Ivor Costa [phone]  Estimated length of stay: past midnight tomorrow  Certification:: I certify this patient will need inpatient services for at least 2 midnights  PT Class (Do Not Modify): Inpatient [101]  PT Acc Code (Do Not Modify): Private [1]       Medical History Past Medical History:  Diagnosis Date  . Acute gastritis without mention of hemorrhage   . Anemia   . Anxiety   . Arthritis    "LLL; palm of right hand" (06/12/2015)  . Atrial fibrillation ([place])   . CAP (community acquired pneumonia) 04/03/2014  . Crohn disease ([place])   . Cyst and pseudocyst of pancreas   . Depression   . Diaphragmatic hernia without mention of obstruction or gangrene   . Diverticulosis of colon (without mention of hemorrhage)   . Duodenitis without mention of hemorrhage   . DVT (deep venous thrombosis) ([place])    "twice on the left leg; once on the right leg" (06/12/2015)  . Dysrhythmia   . Esophageal reflux   . Hiatal hernia- moderate to large 10/06/2014  . Hx pulmonary embolism   . Hypothyroidism   . Insomnia, unspecified   . Left leg DVT ([place]) jan 2013  . Migraine    hx  . OAB (overactive bladder)   . Other and unspecified hyperlipidemia   .  Other malaise and fatigue   . Pancreas divisum 03/02/2015  . Pancreatic cyst   . Personal history of unspecified digestive disease   . Polymyalgia rheumatica ([place])   . Protein-calorie malnutrition, severe ([place]) 10/02/2014  . Restless legs syndrome (RLS)   . Shingles   . Stricture and stenosis of esophagus   . Type II diabetes mellitus ([place])   . Unspecified essential hypertension   . Unspecified sleep apnea    hx of sleep apnea, none since weight loss (06/12/2015)  . Urinary frequency   . Urinary urgency     Allergies Allergies  Allergen Reactions  . Sulfa Antibiotics Nausea And Vomiting  . Penicillins Hives and Rash     Has patient had a PCN reaction causing immediate rash, facial/tongue/throat swelling, SOB or lightheadedness with hypotension: Unknown Has patient had a PCN reaction causing severe rash involving mucus membranes or skin necrosis: Unknown Has patient had a PCN reaction that required hospitalization Unknown Has patient had a PCN reaction occurring within the last 10 years: No If all of the above answers are "NO", then may proceed with Cephalosporin use.     IV Location/Drains/Wounds Patient Lines/Drains/Airways Status   Active Line/Drains/Airways    Name:   Placement date:   Placement time:   Site:   Days:   Peripheral IV 11/22/17 Right Forearm   11/22/17    0124    Forearm   less than 1          Labs/Imaging Results for orders placed or performed during the hospital encounter of 11/22/17 (from the past 48 hour(s))  Lipase, blood     Status: None   Collection Time: 11/22/17  1:04 AM  Result Value Ref Range   Lipase 32 11 - 51 U/L  Comprehensive metabolic panel     Status: Abnormal   Collection Time: 11/22/17  1:04 AM  Result Value Ref Range   Sodium 141 135 - 145 mmol/L   Potassium 4.2 3.5 - 5.1 mmol/L   Chloride 106 101 - 111 mmol/L   CO2 27 22 - 32 mmol/L   Glucose, Bld 125 (H) 65 - 99 mg/dL   BUN 29 (H) 6 - 20 mg/dL   Creatinine, Ser 1.38 (H) 0.44 - 1.00 mg/dL   Calcium 9.0 8.9 - 10.3 mg/dL   Total Protein 6.1 (L) 6.5 - 8.1 g/dL   Albumin 3.2 (L) 3.5 - 5.0 g/dL   AST 26 15 - 41 U/L   ALT 13 (L) 14 - 54 U/L   Alkaline Phosphatase 110 38 - 126 U/L   Total Bilirubin 0.9 0.3 - 1.2 mg/dL   GFR calc non Af Amer 31 (L) >60 mL/min   GFR calc Af Amer 36 (L) >60 mL/min    Comment: (NOTE) The eGFR has been calculated using the CKD EPI equation. This calculation has not been validated in all clinical situations. eGFR's persistently <60 mL/min signify possible Chronic Kidney Disease.    Anion gap 8 5 - 15  CBC     Status: Abnormal   Collection Time: 11/22/17  1:04 AM   Result Value Ref Range   WBC 6.3 4.0 - 10.5 K/uL   RBC 3.61 (L) 3.87 - 5.11 MIL/uL   Hemoglobin 10.8 (L) 12.0 - 15.0 g/dL   HCT 33.7 (L) 36.0 - 46.0 %   MCV 93.4 78.0 - 100.0 fL   MCH 29.9 26.0 - 34.0 pg   MCHC 32.0 30.0 - 36.0 g/dL   RDW 13.4 11.5 -  15.5 %   Platelets 228 150 - 400 K/uL  Urinalysis, Routine w reflex microscopic     Status: Abnormal   Collection Time: 11/22/17  1:04 AM  Result Value Ref Range   Color, Urine YELLOW YELLOW   APPearance CLOUDY (A) CLEAR   Specific Gravity, Urine 1.013 1.005 - 1.030   pH 6.0 5.0 - 8.0   Glucose, UA NEGATIVE NEGATIVE mg/dL   Hgb urine dipstick SMALL (A) NEGATIVE   Bilirubin Urine NEGATIVE NEGATIVE   Ketones, ur NEGATIVE NEGATIVE mg/dL   Protein, ur NEGATIVE NEGATIVE mg/dL   Nitrite POSITIVE (A) NEGATIVE   Leukocytes, UA LARGE (A) NEGATIVE   RBC / HPF 0-5 0 - 5 RBC/hpf   WBC, UA TOO NUMEROUS TO COUNT 0 - 5 WBC/hpf   Bacteria, UA MANY (A) NONE SEEN   Squamous Epithelial / LPF NONE SEEN NONE SEEN   WBC Clumps PRESENT    Mucus PRESENT    No results found.  Pending Labs Unresulted Labs (From admission, onward)   Start     Ordered   11/22/17 0520  Culture, blood (Routine X 2) w Reflex to ID Panel  BLOOD CULTURE X 2,   R    Comments:  Please obtain prior to antibiotic administration.    11/22/17 0519   11/22/17 0449  Urine culture  STAT,   STAT     11/22/17 0448      Vitals/Pain Today's Vitals   11/22/17 0109 11/22/17 0400 11/22/17 0402 11/22/17 0500  BP: 123/64 113/70 113/70 (!) 115/94  Pulse: (!) 59 (!) 58 (!) 59 64  Resp: (!) _0 Temp: 97.8 F (36.6 C)     TempSrc: Oral     SpO2: 95% 97% 96% 97%  Weight:      Height:      PainSc:        Isolation Precautions No active isolations  Medications Medications  levofloxacin (LEVAQUIN) IVPB 750 mg (750 mg Intravenous New Bag/Given 11/22/17 0435)  ondansetron (ZOFRAN) injection 4 mg (not administered)  0.9 %  sodium chloride infusion (not administered)   morphine 2 MG/ML injection 0.5 mg (not administered)  insulin aspart (novoLOG) injection 0-9 Units (not administered)  insulin aspart (novoLOG) injection 0-5 Units (not administered)  acetaminophen (TYLENOL) tablet 650 mg (not administered)    Or  acetaminophen (TYLENOL) suppository 650 mg (not administered)  zolpidem (AMBIEN) tablet 5 mg (not administered)  hydrALAZINE (APRESOLINE) injection 5 mg (not administered)  sodium chloride 0.9 % bolus 500 mL (0 mLs Intravenous Stopped 11/22/17 0209)  ondansetron (ZOFRAN) injection 4 mg (4 mg Intravenous Given 11/22/17 0146)    Mobility walks

## 2017-11-22 NOTE — ED Notes (Signed)
Writer made two unsuccessful attempts to draw blood cultures.

## 2017-11-22 NOTE — ED Notes (Signed)
Bed: WA17 Expected date:  Expected time:  Means of arrival:  Comments: EMS 83 yo female from home N/VD weakness

## 2017-11-22 NOTE — ED Notes (Signed)
Patient transported to CT 

## 2017-11-22 NOTE — H&P (Signed)
History and Physical    [name] TGP:[id number] DOB: [birth year] DOA: 11/22/2017  Referring MD/NP/PA:   PCP: Briscoe Deutscher, DO   Patient coming from:  The patient is coming from home.  At baseline, pt is independent for most of ADL.   Chief Complaint: nausea, vomiting, diarrhea, abdominal pain, urinary incontinence  HPI: [name] is a 83 y.o. female with medical history significant of Hypertension, diabetes mellitus, GERD, hypothyroidism, depression, anxiety, OSA, PMR, DVT, atrial fibrillation Eliquis, anemia, CKD-3, who presents with nausea, vomiting, diarrhea, abdominal pain or urinary incontinence.  Patient states that she started having worsening nausea, vomiting, abdominal pain and diarrhea since Saturday. She vomited twice with clear materials. She had had two watery bowel movement today. Her abdominal pain is diffuse, constant, moderate, nonradiating. No fever or chills. Patient does not have chest pain, SOB, cough, fever or chills. She said that she has urinary incontinence, but does not know if she has any dysuria or burning on urination. Patient moves all extremities normally.   ED Course: pt was found to have Positive urinalysis with large amount of leukocytes and positive nitrite, WBC 6.3, stable renal function, temperature normal, bradycardia, oxygen saturation 96% on room air. Patient is admitted to telemetry bed as inpatient.  Review of Systems:   General: no fevers, chills, no body weight gain, has poor appetite, has fatigue HEENT: no blurry vision, hearing changes or sore throat Respiratory: no dyspnea, coughing, wheezing CV: no chest pain, no palpitations GI: has nausea, vomiting, abdominal pain, diarrhea, no constipation GU: no dysuria, burning on urination, increased urinary frequency, hematuria. Has Urinary incontinence. Ext: no leg edema Neuro: no unilateral weakness, numbness, or tingling, no vision change or hearing loss Skin: no rash, no skin  tear. MSK: No muscle spasm, no deformity, no limitation of range of movement in spin Heme: No easy bruising.  Travel history: No recent long distant travel.  Allergy:  Allergies  Allergen Reactions  . Sulfa Antibiotics Nausea And Vomiting  . Penicillins Hives and Rash    Has patient had a PCN reaction causing immediate rash, facial/tongue/throat swelling, SOB or lightheadedness with hypotension: Unknown Has patient had a PCN reaction causing severe rash involving mucus membranes or skin necrosis: Unknown Has patient had a PCN reaction that required hospitalization Unknown Has patient had a PCN reaction occurring within the last 10 years: No If all of the above answers are "NO", then may proceed with Cephalosporin use.     Past Medical History:  Diagnosis Date  . Acute gastritis without mention of hemorrhage   . Anemia   . Anxiety   . Arthritis    "LLL; palm of right hand" (06/12/2015)  . Atrial fibrillation ([place])   . CAP (community acquired pneumonia) 04/03/2014  . Crohn disease ([place])   . Cyst and pseudocyst of pancreas   . Depression   . Diaphragmatic hernia without mention of obstruction or gangrene   . Diverticulosis of colon (without mention of hemorrhage)   . Duodenitis without mention of hemorrhage   . DVT (deep venous thrombosis) ([place])    "twice on the left leg; once on the right leg" (06/12/2015)  . Dysrhythmia   . Esophageal reflux   . Hiatal hernia- moderate to large 10/06/2014  . Hx pulmonary embolism   . Hypothyroidism   . Insomnia, unspecified   . Left leg DVT ([place]) jan 2013  . Migraine    hx  . OAB (overactive bladder)   . Other and unspecified hyperlipidemia   .  Other malaise and fatigue   . Pancreas divisum 03/02/2015  . Pancreatic cyst   . Personal history of unspecified digestive disease   . Polymyalgia rheumatica ([place])   . Protein-calorie malnutrition, severe ([place]) 10/02/2014  . Restless legs syndrome (RLS)   . Shingles   . Stricture and stenosis of  esophagus   . Type II diabetes mellitus ([place])   . Unspecified essential hypertension   . Unspecified sleep apnea    hx of sleep apnea, none since weight loss (06/12/2015)  . Urinary frequency   . Urinary urgency     Past Surgical History:  Procedure Laterality Date  . BACK SURGERY    . CATARACT EXTRACTION, BILATERAL Bilateral   . CLOSED REDUCTION NASAL FRACTURE  08/2008   Archie Endo 04/10/2011  . COLON SURGERY  June 17, 2012   surgery for bleed  . COLONOSCOPY N/A 06/13/2015   Procedure: COLONOSCOPY;  Surgeon: Irene Shipper, MD;  Location: [facility];  Service: Endoscopy;  Laterality: N/A;  . OOPHORECTOMY  1944   not sure which ovary  . POSTERIOR LUMBAR FUSION  01/2009   L4-L5 Archie Endo 04/09/2011  . REDUCTION MAMMAPLASTY Bilateral 1980  . ROTATOR CUFF REPAIR Right   . SHOULDER OPEN ROTATOR CUFF REPAIR  08/04/2012   Procedure: ROTATOR CUFF REPAIR SHOULDER OPEN;  Surgeon: Tobi Bastos, MD;  Location: [facility];  Service: Orthopedics;  Laterality: Left;  with Anchors and Graft  . SHOULDER OPEN ROTATOR CUFF REPAIR Right 2006   Archie Endo 04/22/2011  . TONSILLECTOMY      Social History:  reports that  has never smoked. she has never used smokeless tobacco. She reports that she does not drink alcohol or use drugs.  Family History:  Family History  Problem Relation Age of Onset  . Melanoma Daughter        died age 39  . Melanoma Son      Prior to Admission medications   Medication Sig Start Date End Date Taking? Authorizing Provider  apixaban (ELIQUIS) 2.5 MG TABS tablet Take 1 tablet (2.5 mg total) by mouth 2 (two) times daily. 10/09/14  Yes Regalado, Belkys A, MD  furosemide (LASIX) 20 MG tablet Take 20 mg by mouth daily.   Yes [provider]  pantoprazole (PROTONIX) 40 MG tablet Take 40 mg by mouth daily.   Yes [provider]  ropinirole (REQUIP) 5 MG tablet Take 5 mg by mouth at bedtime.   Yes [provider]  sitaGLIPtin (JANUVIA) 25 MG tablet Take 25 mg daily by  mouth.   Yes [provider]  triamcinolone ointment (KENALOG) 0.5 % Apply 1 application topically 2 (two) times daily. Patient taking differently: Apply 1 application 2 (two) times daily as needed topically (irritation,rash).  09/21/17  Yes Briscoe Deutscher, DO  nitrofurantoin, macrocrystal-monohydrate, (MACROBID) 100 MG capsule Take 1 capsule (100 mg total) by mouth 2 (two) times daily. X 7 days Patient not taking: Reported on 11/03/2017 11/01/17   Mesner, Corene Cornea, MD  ondansetron (ZOFRAN ODT) 4 MG disintegrating tablet 43m ODT q4 hours prn nausea/vomit Patient not taking: Reported on 11/22/2017 10/24/17   FDeno Etienne DO  ondansetron (ZOFRAN) 4 MG tablet Take 1 tablet (4 mg total) by mouth every 8 (eight) hours as needed for nausea or vomiting. Patient not taking: Reported on 11/03/2017 11/01/17   MMerrily Pew MD    Physical Exam: Vitals:   11/22/17 0400 11/22/17 0402 11/22/17 0500 11/22/17 0600  BP: 113/70 113/70 (!) 115/94 (!) 119/54  Pulse: (Marland Kitchen  58 (!) 59 64 (!) 56  Resp: 18 19 14  (!) 23  Temp:      TempSrc:      SpO2: 97% 96% 97% 99%  Weight:      Height:       General: Not in acute distress HEENT:       Eyes: PERRL, EOMI, no scleral icterus.       ENT: No discharge from the ears and nose, no pharynx injection, no tonsillar enlargement.        Neck: No JVD, no bruit, no mass felt. Heme: No neck lymph node enlargement. Cardiac: S1/S2, RRR, No murmurs, No gallops or rubs. Respiratory: No rales, wheezing, rhonchi or rubs. GI: Soft, nondistended, has diffused tentenderness, no rebound pain, no organomegaly, BS present. GU: No hematuria Ext: No pitting leg edema bilaterally. 2+DP/PT pulse bilaterally. Musculoskeletal: No joint deformities, No joint redness or warmth, no limitation of ROM in spin. Skin: No rashes.  Neuro: Alert, oriented X3, cranial nerves II-XII grossly intact, moves all extremities normally.  Psych: Patient is not psychotic, no suicidal or hemocidal  ideation.  Labs on Admission: I have personally reviewed following labs and imaging studies  CBC: Recent Labs  Lab 11/22/17 0104  WBC 6.3  HGB 10.8*  HCT 33.7*  MCV 93.4  PLT 250   Basic Metabolic Panel: Recent Labs  Lab 11/22/17 0104  NA 141  K 4.2  CL 106  CO2 27  GLUCOSE 125*  BUN 29*  CREATININE 1.38*  CALCIUM 9.0   GFR: Estimated Creatinine Clearance: 20.6 mL/min (A) (by C-G formula based on SCr of 1.38 mg/dL (H)). Liver Function Tests: Recent Labs  Lab 11/22/17 0104  AST 26  ALT 13*  ALKPHOS 110  BILITOT 0.9  PROT 6.1*  ALBUMIN 3.2*   Recent Labs  Lab 11/22/17 0104  LIPASE 32   No results for input(s): AMMONIA in the last 168 hours. Coagulation Profile: No results for input(s): INR, PROTIME in the last 168 hours. Cardiac Enzymes: No results for input(s): CKTOTAL, CKMB, CKMBINDEX, TROPONINI in the last 168 hours. BNP (last 3 results) No results for input(s): PROBNP in the last 8760 hours. HbA1C: No results for input(s): HGBA1C in the last 72 hours. CBG: No results for input(s): GLUCAP in the last 168 hours. Lipid Profile: No results for input(s): CHOL, HDL, LDLCALC, TRIG, CHOLHDL, LDLDIRECT in the last 72 hours. Thyroid Function Tests: No results for input(s): TSH, T4TOTAL, FREET4, T3FREE, THYROIDAB in the last 72 hours. Anemia Panel: No results for input(s): VITAMINB12, FOLATE, FERRITIN, TIBC, IRON, RETICCTPCT in the last 72 hours. Urine analysis:    Component Value Date/Time   COLORURINE YELLOW 11/22/2017 0104   APPEARANCEUR CLOUDY (A) 11/22/2017 0104   LABSPEC 1.013 11/22/2017 0104   PHURINE 6.0 11/22/2017 0104   GLUCOSEU NEGATIVE 11/22/2017 0104   HGBUR SMALL (A) 11/22/2017 0104   BILIRUBINUR NEGATIVE 11/22/2017 0104   KETONESUR NEGATIVE 11/22/2017 0104   PROTEINUR NEGATIVE 11/22/2017 0104   UROBILINOGEN 0.2 02/28/2015 1542   NITRITE POSITIVE (A) 11/22/2017 0104   LEUKOCYTESUR LARGE (A) 11/22/2017 0104   Sepsis  Labs: @LABRCNTIP (procalcitonin:4,lacticidven:4) )No results found for this or any previous visit (from the past 240 hour(s)).   Radiological Exams on Admission: No results found.   EKG: Independently reviewed.  Sinus rhythm, poor R-wave progression, QTC 471, bifascicular block.  Assessment/Plan Principal Problem:   UTI (urinary tract infection) Active Problems:   GERD (gastroesophageal reflux disease)   Anemia of chronic disease   Type II  diabetes mellitus with renal manifestations (HCC)   Nausea vomiting and diarrhea   Abdominal pain   CKD (chronic kidney disease), stage III (HCC)   Hypertension   DVT (deep vein thrombosis) in pregnancy ([place])   Atrial fibrillation, chronic (HCC)   Depression   Protein-calorie malnutrition, moderate (HCC)   UTI (urinary tract infection):  Patient is not septic. Patient had history of positive urine culture for Klebsiella for several times. -admitted to telemetry bed as inpatient -Patient was started with Levaquin, which is suspected to meropenem -Follow up blood culture and urine culture -consult CM   Nausea vomiting and diarrhea and AP: Patient seems to have a chronic nausea vomiting and abdominal pain. Patient had colonoscopy on 06/12/17, which was negative. Today her symptoms has worsened. Etiology is not clear. Lipase 32 -When necessary Zofran for nausea, low-dose morphine for pain -f/u CT abdomen/pelvis -she has had two times of diarrhea today, does not meet criteria for check C diff PCR. -IVF: 500 cc of NS, then 100 cc/h  GERD: -Protonix  Type II diabetes mellitus with renal manifestations: Last A1c 6.9 on 09/21/17, well controled. Patient is taking [place] at home -SSI  DVT: -on Eliquis  Atrial Fibrillation: CHA2DS2-VASc Score is 5, needs oral anticoagulation. Patient is on Eliquis at home. Patient is a bradycardia. -tele monitoring   HTN: bp 113/70 -hold lasix since pt need IVF -IV hydralazine prn  Depression: Stable, no  suicidal or homicidal ideations. -Continue home medications: Requip  CKD (chronic kidney disease), stage III ([place]): Stable. Baseline creatinine 1.2-1.5. Her creatinine is 1.38, BUN 29 -Follow up Renal function by BMP  Protein-calorie malnutrition, moderate ([place]): -Nutrition consult   DVT ppx: on Eliquis Code Status: DNR (I discussed with patient, and explained the meaning of [place]. Patient wants to be DNR) Family Communication: None at bed side.    Disposition Plan:  Anticipate discharge back to previous home environment Consults called:  none Admission status: Inpatient/tele    Date of Service 11/22/2017    Ivor Costa [facility] Pager [phone]  If 7PM-7AM, please contact night-coverage [email or web address] Password [facility] 11/22/2017, 6:35 AM

## 2017-11-22 NOTE — Progress Notes (Signed)
Paged attending MD to make aware of 3 stools/24hrs, response pending

## 2017-11-22 NOTE — ED Triage Notes (Signed)
From home lives by self with n/v/d since 0900 am yesterday no pain voiced no fever noted.

## 2017-11-22 NOTE — Progress Notes (Signed)
Pharmacy Antibiotic Note  [name] is a 83 y.o. female admitted on 11/22/2017 with UTI.  Pharmacy has been consulted for meropenem dosing.  Plan: Meropenem 1 Gm x1 then 500 mg IV q12h F/u scr/cultures  Height: 5\' 4"  (162.6 cm) Weight: 123 lb (55.8 kg) IBW/kg (Calculated) : 54.7  Temp (24hrs), Avg:97.8 F (36.6 C), Min:97.8 F (36.6 C), Max:97.8 F (36.6 C)  Recent Labs  Lab 11/22/17 0104  WBC 6.3  CREATININE 1.38*    Estimated Creatinine Clearance: 20.6 mL/min (A) (by C-G formula based on SCr of 1.38 mg/dL (H)).    Allergies  Allergen Reactions  . Sulfa Antibiotics Nausea And Vomiting  . Penicillins Hives and Rash    Has patient had a PCN reaction causing immediate rash, facial/tongue/throat swelling, SOB or lightheadedness with hypotension: Unknown Has patient had a PCN reaction causing severe rash involving mucus membranes or skin necrosis: Unknown Has patient had a PCN reaction that required hospitalization Unknown Has patient had a PCN reaction occurring within the last 10 years: No If all of the above answers are "NO", then may proceed with Cephalosporin use.     Antimicrobials this admission: 12/16 meropenem >>    >>   Dose adjustments this admission:   Microbiology results:  BCx:   UCx:    Sputum:    MRSA PCR:   Thank you for allowing pharmacy to be a part of this patient's care.  Dorrene German 11/22/2017 5:49 AM

## 2017-11-22 NOTE — ED Notes (Signed)
No respiratory or acute distress noted resting in bed with eyes closed call light in reach no reaction to medication noted IV site free of redness or infiltration.

## 2017-11-22 NOTE — ED Notes (Signed)
No respiratory or acute distress noted alert and oriented x 3 no reaction to medication noted.

## 2017-11-22 NOTE — Progress Notes (Addendum)
PROGRESS NOTE  [name] GQQ:[id number] DOB: February 01, 1919 DOA: 11/22/2017 PCP: Briscoe Deutscher, DO  HPI/Recap of past 24 hours:  Three loose stool per RN report No vomiting today No fever  Assessment/Plan: Principal Problem:   UTI (urinary tract infection) Active Problems:   GERD (gastroesophageal reflux disease)   Anemia of chronic disease   Type II diabetes mellitus with renal manifestations (HCC)   Nausea vomiting and diarrhea   Abdominal pain   CKD (chronic kidney disease), stage III (HCC)   Hypertension   DVT (deep vein thrombosis) in pregnancy ([place])   Atrial fibrillation, chronic (HCC)   Depression   Protein-calorie malnutrition, moderate ([place])  N/V/D with abdominal pain: No fever, no leukocytosis CT ab/epl concerns for enteritis Change impenem to rocephin/flagyl, continue hydration, advance diet as tolerated. Stool c diff/gi pcr panel  Recurrent UTI? Patient is incontinence with urine, recent urine culture +klebsiella resistance to ampicillin but sensitive to the rest of abx tested, she was on imipenem on admission, currently on cipro. Follow up on urine and blood culture  noninsulin Type II diabetes mellitus :  Last A1c 6.9 on 09/21/17, well controled. Patient is taking [place] at home. Carb modified diet   H/o paroxysmal Atrial Fibrillation: CHA2DS2-VASc Score is 5,  Patient is on Eliquis which is continued  sinus bradycardia heart rate high 50's to low 60's here -tele monitoring   H/o DVT: -on Eliquis  HTN: bp 113/70 -hold lasix since pt need IVF -IV hydralazine prn   CKD (chronic kidney disease), stage III ([place]): Stable. Baseline creatinine 1.2-1.5. Her creatinine is 1.38, BUN 29 -Follow up Renal function by BMP     Code Status: DNR  Family Communication: patient   Disposition Plan: pending PT eval   Consultants:  none  Procedures:  none  Antibiotics:  Meropenem from admission to 12/16  Cipro/flagyl from  12/16   Objective: BP (!) 124/57 (BP Location: Left Arm)   Pulse (!) 57   Temp 97.8 F (36.6 C) (Oral)   Resp 16   Ht 5' 4"  (1.626 m)   Wt 55.8 kg (123 lb)   SpO2 100%   BMI 21.11 kg/m   Intake/Output Summary (Last 24 hours) at 11/22/2017 1728 Last data filed at 11/22/2017 1702 Gross per 24 hour  Intake 788.33 ml  Output 100 ml  Net 688.33 ml   Filed Weights   11/22/17 0100  Weight: 55.8 kg (123 lb)    Exam: Patient is examined daily including today on 11/22/2017, exams remain the same as of yesterday except that has changed    General:  NAD  Cardiovascular: RRR  Respiratory: CTABL  Abdomen: Soft/ND/NT, positive BS  Musculoskeletal: No Edema  Neuro: alert, oriented   Data Reviewed: Basic Metabolic Panel: Recent Labs  Lab 11/22/17 0104  NA 141  K 4.2  CL 106  CO2 27  GLUCOSE 125*  BUN 29*  CREATININE 1.38*  CALCIUM 9.0   Liver Function Tests: Recent Labs  Lab 11/22/17 0104  AST 26  ALT 13*  ALKPHOS 110  BILITOT 0.9  PROT 6.1*  ALBUMIN 3.2*   Recent Labs  Lab 11/22/17 0104  LIPASE 32   No results for input(s): AMMONIA in the last 168 hours. CBC: Recent Labs  Lab 11/22/17 0104  WBC 6.3  HGB 10.8*  HCT 33.7*  MCV 93.4  PLT 228   Cardiac Enzymes:   No results for input(s): CKTOTAL, CKMB, CKMBINDEX, TROPONINI in the last 168 hours. BNP (last 3 results) No  results for input(s): BNP in the last 8760 hours.  ProBNP (last 3 results) No results for input(s): PROBNP in the last 8760 hours.  CBG: Recent Labs  Lab 11/22/17 0736 11/22/17 1130 11/22/17 1621  GLUCAP 98 87 107*    No results found for this or any previous visit (from the past 240 hour(s)).   Studies: Ct Abdomen Pelvis Wo Contrast  Result Date: 11/22/2017 CLINICAL DATA:  Nausea and vomiting for 2 days, initial encounter EXAM: CT ABDOMEN AND PELVIS WITHOUT CONTRAST TECHNIQUE: Multidetector CT imaging of the abdomen and pelvis was performed following the standard  protocol without IV contrast. COMPARISON:  10/24/2017 FINDINGS: Lower chest: Lung bases are free of acute infiltrate or sizable effusion. A large hiatal hernia is noted. Hepatobiliary: Small hypodensity is again noted within the liver stable from the previous exam. No gallstones, gallbladder wall thickening, or biliary dilatation. Pancreas: Mildly fatty infiltrated. The previously seen cystic lesion is again present but less prominent due to the lack of IV contrast. Spleen: Normal in size without focal abnormality. Adrenals/Urinary Tract: Adrenal glands are within normal limits bilaterally. Kidneys are well visualized without evidence of renal calculi or obstructive changes. The bladder is well distended. Stomach/Bowel: Scattered diverticular change is noted within the sigmoid colon without evidence of diverticulitis. No obstructive or inflammatory changes are seen. Fluid is noted within the right colon consistent with the given clinical history. The appendix is not visualized although no inflammatory changes to suggest appendicitis are seen. A few loops of minimally prominent jejunum are noted without true obstructive change. Vascular/Lymphatic: Aortic atherosclerosis. No enlarged abdominal or pelvic lymph nodes. Reproductive: Uterus and bilateral adnexa are unremarkable. Other: No abdominal wall hernia or abnormality. No abdominopelvic ascites. Musculoskeletal: Postsurgical changes in the lumbar spine are noted. Stable anterolisthesis of L3 on L4 is noted. No acute bony abnormality is seen. IMPRESSION: Diverticulosis without diverticulitis. Fluid within the right colon. Few loops of minimally prominent proximal jejunum are noted without definitive obstructive change. A mild enteritis could not be totally excluded. Chronic changes stable from the previous exam. Electronically Signed   By: Inez Catalina M.D.   On: 11/22/2017 07:09    Scheduled Meds: . apixaban  2.5 mg Oral BID  . pantoprazole  40 mg Oral Daily   . ropinirole  5 mg Oral QHS  . triamcinolone ointment  1 application Topical BID    Continuous Infusions: . sodium chloride 100 mL/hr at 11/22/17 1307  . ciprofloxacin    . metronidazole       Time spent: 35 mins from 3pm to 3:35pm I have personally reviewed and interpreted on  11/22/2017 daily labs, tele strips, imagings as discussed above under date review session and assessment and plans.  I reviewed all nursing notes, pharmacy notes, consultant notes,  vitals, pertinent old records  I have discussed plan of care as described above with RN , patient on 11/22/2017   Florencia Reasons MD, PhD  [facility] Pager [phone]. If 7PM-7AM, please contact night-coverage at [email or web address], password [facility] 11/22/2017, 5:28 PM  LOS: 0 days

## 2017-11-22 NOTE — ED Notes (Signed)
Cleaned up pt from having bowel movement with redness noted in coccyx area.

## 2017-11-23 LAB — CBC WITH DIFFERENTIAL/PLATELET
Basophils Absolute: 0 10*3/uL (ref 0.0–0.1)
Basophils Relative: 1 %
EOS PCT: 3 %
Eosinophils Absolute: 0.2 10*3/uL (ref 0.0–0.7)
HCT: 34.1 % — ABNORMAL LOW (ref 36.0–46.0)
Hemoglobin: 10.9 g/dL — ABNORMAL LOW (ref 12.0–15.0)
LYMPHS ABS: 1.6 10*3/uL (ref 0.7–4.0)
LYMPHS PCT: 27 %
MCH: 29.9 pg (ref 26.0–34.0)
MCHC: 32 g/dL (ref 30.0–36.0)
MCV: 93.4 fL (ref 78.0–100.0)
MONO ABS: 0.4 10*3/uL (ref 0.1–1.0)
Monocytes Relative: 7 %
Neutro Abs: 3.7 10*3/uL (ref 1.7–7.7)
Neutrophils Relative %: 62 %
PLATELETS: 193 10*3/uL (ref 150–400)
RBC: 3.65 MIL/uL — AB (ref 3.87–5.11)
RDW: 13.2 % (ref 11.5–15.5)
WBC: 5.9 10*3/uL (ref 4.0–10.5)

## 2017-11-23 LAB — BASIC METABOLIC PANEL
Anion gap: 8 (ref 5–15)
BUN: 21 mg/dL — AB (ref 6–20)
CO2: 24 mmol/L (ref 22–32)
Calcium: 8.4 mg/dL — ABNORMAL LOW (ref 8.9–10.3)
Chloride: 106 mmol/L (ref 101–111)
Creatinine, Ser: 1.2 mg/dL — ABNORMAL HIGH (ref 0.44–1.00)
GFR calc Af Amer: 43 mL/min — ABNORMAL LOW (ref >60)
GFR, EST NON AFRICAN AMERICAN: 37 mL/min — AB (ref >60)
GLUCOSE: 111 mg/dL — AB (ref 65–99)
POTASSIUM: 4.1 mmol/L (ref 3.5–5.1)
Sodium: 138 mmol/L (ref 135–145)

## 2017-11-23 LAB — MAGNESIUM: Magnesium: 1.7 mg/dL (ref 1.7–2.4)

## 2017-11-23 MED ORDER — ENSURE ENLIVE PO LIQD
237.0000 mL | Freq: Two times a day (BID) | ORAL | Status: DC
  Administered 2017-11-23: 237 mL via ORAL

## 2017-11-23 NOTE — Progress Notes (Signed)
Initial Nutrition Assessment  DOCUMENTATION CODES:   Non-severe (moderate) malnutrition in context of chronic illness  INTERVENTION:   Provide Ensure Enlive po BID, each supplement provides 350 kcal and 20 grams of protein RD will continue to monitor  NUTRITION DIAGNOSIS:   Moderate Malnutrition related to chronic illness, poor appetite(poor taste, advanced age) as evidenced by energy intake < or equal to 75% for > or equal to 1 month, severe fat depletion, severe muscle depletion.  GOAL:   Patient will meet greater than or equal to 90% of their needs  MONITOR:   PO intake, Supplement acceptance, Labs, Weight trends, I & O's  REASON FOR ASSESSMENT:   Consult Assessment of nutrition requirement/status  ASSESSMENT:    83 y.o. female with medical history significant of Hypertension, diabetes mellitus, GERD, hypothyroidism, depression, anxiety, OSA, PMR, DVT, atrial fibrillation Eliquis, anemia, CKD-3, who presents with nausea, vomiting, diarrhea, abdominal pain or urinary incontinence.  Pt just completed liquid lunch tray, ate ~50% of her tomato soup, pudding and jello. Pt states she really cannot taste food anymore. Pt lacks desire to eat most of the time. Pt states she does eat cereal for breakfast, a sandwich for lunch and a small dinner. Pt consumed 50% of her lunch yesterday.  Pt states she does like Ensure supplement, RD to order.  Pt's weight is stable per chart review.  Medications: Protonix tablet daily, IV Zofran PRN Labs reviewed: GFR: 37   NUTRITION - FOCUSED PHYSICAL EXAM:  Depletions may be associated with advanced age.    Most Recent Value  Orbital Region  No depletion [swollen from irritation]  Upper Arm Region  Severe depletion  Thoracic and Lumbar Region  Unable to assess  Buccal Region  No depletion  Temple Region  Mild depletion  Clavicle Bone Region  Severe depletion  Clavicle and Acromion Bone Region  Severe depletion  Scapular Bone Region   Severe depletion  Dorsal Hand  Moderate depletion  Patellar Region  Unable to assess  Anterior Thigh Region  Unable to assess  Posterior Calf Region  Moderate depletion  Edema (RD Assessment)  None       Diet Order:  Diet full liquid Room service appropriate? Yes; Fluid consistency: Thin  EDUCATION NEEDS:   Education needs have been addressed  Skin:  Skin Assessment: Reviewed RN Assessment  Last BM:  12/16  Height:   Ht Readings from Last 1 Encounters:  11/22/17 5\' 4"  (1.626 m)    Weight:   Wt Readings from Last 1 Encounters:  11/22/17 123 lb (55.8 kg)    Ideal Body Weight:  54.5 kg  BMI:  Body mass index is 21.11 kg/m.  Estimated Nutritional Needs:   Kcal:  1250-1450  Protein:  55-65g  Fluid:  1.4L/day  Clayton Bibles, MS, RD, LDN [facility] Pager: [phone] After Hours Pager: [phone]

## 2017-11-23 NOTE — Telephone Encounter (Signed)
Patient will need to be referred to [facility] for neurocognitive testing.

## 2017-11-23 NOTE — Progress Notes (Signed)
Unable to collect stool, no BM yet today.

## 2017-11-23 NOTE — Telephone Encounter (Signed)
Please advise 

## 2017-11-23 NOTE — Telephone Encounter (Signed)
Tried to call the patient back. No voicemail set up.

## 2017-11-23 NOTE — Evaluation (Signed)
Physical Therapy Evaluation Patient Details Name: [name] MRN: [id number] DOB: 1919-01-05 Today's Date: 11/23/2017   History of Present Illness  83 y.o. female with medical history significant of Hypertension, diabetes mellitus, GERD, hypothyroidism, depression, anxiety, OSA, PMR, DVT, atrial fibrillation Eliquis, anemia, CKD-3 and admitted for UTI   Clinical Impression  Pt admitted with above diagnosis. Pt currently with functional limitations due to the deficits listed below (see PT Problem List).  Pt will benefit from skilled PT to increase their independence and safety with mobility to allow discharge to the venue listed below.  Pt assisted OOB to recliner today.  Pt declined ambulating.  Pt reports she lives alone however in a condo so neighbors are close.     Follow Up Recommendations SNF;Supervision for mobility/OOB(if initial supervision for mobility unavailable, pt may need SNF)    Equipment Recommendations  None recommended by PT    Recommendations for Other Services       Precautions / Restrictions Precautions Precautions: Fall Precaution Comments: urinary incontinence Restrictions Weight Bearing Restrictions: No      Mobility  Bed Mobility Overal bed mobility: Needs Assistance Bed Mobility: Supine to Sit     Supine to sit: Min guard;HOB elevated     General bed mobility comments: provided a hand for pt to self assist trunk upright  Transfers Overall transfer level: Needs assistance Equipment used: None Transfers: Sit to/from [facility] Sit to Stand: Min assist Stand pivot transfers: Min guard       General transfer comment: min/guard for safety, pt utilizes bed surface against legs to stabilize, able to correct with cues, urinary incontinence with standing so pt returned to sitting, pt able to perform hygiene and take a few steps upon standing again over to recliner; pt declined ambulating today  Ambulation/Gait                 Stairs            Wheelchair Mobility    Modified Rankin (Stroke Patients Only)       Balance Overall balance assessment: Needs assistance         Standing balance support: No upper extremity supported Standing balance-Leahy Scale: Fair                               Pertinent Vitals/Pain Pain Assessment: Faces Faces Pain Scale: Hurts little more Pain Location: back Pain Descriptors / Indicators: Sore Pain Intervention(s): Limited activity within patient's tolerance;Repositioned;Monitored during session    Home Living Family/patient expects to be discharged to:: Private residence Living Arrangements: Alone Available Help at Discharge: Friend(s);Neighbor;Available PRN/intermittently Type of Home: (Condo)       Home Layout: One level Home Equipment: [place] - 2 wheels;Walker - 4 wheels;Cane - quad      Prior Function Level of Independence: Independent with assistive device(s)               Hand Dominance        Extremity/Trunk Assessment        Lower Extremity Assessment Lower Extremity Assessment: Generalized weakness       Communication   Communication: HOH  Cognition Arousal/Alertness: Awake/alert Behavior During Therapy: WFL for tasks assessed/performed Overall Cognitive Status: Within Functional Limits for tasks assessed  General Comments      Exercises     Assessment/Plan    PT Assessment Patient needs continued PT services  PT Problem List Decreased strength;Decreased mobility;Decreased activity tolerance;Decreased balance       PT Treatment Interventions Gait training;DME instruction;Therapeutic activities;Therapeutic exercise;Functional mobility training;Patient/family education;Balance training    PT Goals (Current goals can be found in the Care Plan section)  Acute Rehab PT Goals PT Goal Formulation: With patient Time For Goal Achievement:  12/07/17 Potential to Achieve Goals: Good    Frequency Min 3X/week   Barriers to discharge        Co-evaluation               AM-PAC PT "6 Clicks" Daily Activity  Outcome Measure Difficulty turning over in bed (including adjusting bedclothes, sheets and blankets)?: None Difficulty moving from lying on back to sitting on the side of the bed? : A Lot Difficulty sitting down on and standing up from a chair with arms (e.g., wheelchair, bedside commode, etc,.)?: Unable Help needed moving to and from a bed to chair (including a wheelchair)?: A Little Help needed walking in hospital room?: A Little Help needed climbing 3-5 steps with a railing? : A Lot 6 Click Score: 15    End of Session   Activity Tolerance: Patient tolerated treatment well Patient left: in chair;with call bell/phone within reach Nurse Communication: Mobility status PT Visit Diagnosis: Other abnormalities of gait and mobility (R26.89)    Time: 6269-4854 PT Time Calculation (min) (ACUTE ONLY): 15 min   Charges:   PT Evaluation $PT Eval Low Complexity: 1 Low     PT G CodesCarmelia Bake, PT, DPT 11/23/2017 Pager: [phone]  York Ram E 11/23/2017, 1:06 PM

## 2017-11-23 NOTE — Plan of Care (Signed)
  Progressing Education: Knowledge of General Education information will improve 11/23/2017 0937 - Progressing by Nelson Julson, Royetta Crochet, RN Health Behavior/Discharge Planning: Ability to manage health-related needs will improve 11/23/2017 0937 - Progressing by Dijuan Sleeth, Royetta Crochet, RN Clinical Measurements: Ability to maintain clinical measurements within normal limits will improve 11/23/2017 0937 - Progressing by Nicky Kras, Royetta Crochet, RN Will remain free from infection 11/23/2017 5501 - Progressing by Halen Antenucci, Royetta Crochet, RN Diagnostic test results will improve 11/23/2017 [phone] - Progressing by Tess Potts, Royetta Crochet, RN Respiratory complications will improve 11/23/2017 0937 - Progressing by Marckus Hanover, Royetta Crochet, RN Cardiovascular complication will be avoided 11/23/2017 2574 - Progressing by Vassie Kugel, Royetta Crochet, RN Activity: Risk for activity intolerance will decrease 11/23/2017 0937 - Progressing by Shatonya Passon, Royetta Crochet, RN Nutrition: Adequate nutrition will be maintained 11/23/2017 0937 - Progressing by Lilybelle Mayeda, Royetta Crochet, RN Coping: Level of anxiety will decrease 11/23/2017 0937 - Progressing by Nihar Klus, Royetta Crochet, RN Elimination: Will not experience complications related to bowel motility 11/23/2017 0937 - Progressing by Arnitra Sokoloski, Royetta Crochet, RN Will not experience complications related to urinary retention 11/23/2017 0937 - Progressing by Letizia Hook, Royetta Crochet, RN Pain Managment: General experience of comfort will improve 11/23/2017 0937 - Progressing by Teaghan Formica, Royetta Crochet, RN Safety: Ability to remain free from injury will improve 11/23/2017 0937 - Progressing by Evelen Vazguez, Royetta Crochet, RN Skin Integrity: Risk for impaired skin integrity will decrease 11/23/2017 0937 - Progressing by Trejon Duford, Royetta Crochet, RN

## 2017-11-23 NOTE — Progress Notes (Signed)
PROGRESS NOTE  [name] MGQ:[id number] DOB: June 29, 1919 DOA: 11/22/2017 PCP: Briscoe Deutscher, DO  HPI/Recap of past 24 hours:  No bo today, no fever, she denies ab pain,  No fever  Assessment/Plan: Principal Problem:   UTI (urinary tract infection) Active Problems:   GERD (gastroesophageal reflux disease)   Anemia of chronic disease   Type II diabetes mellitus with renal manifestations (HCC)   Nausea vomiting and diarrhea   Abdominal pain   CKD (chronic kidney disease), stage III (HCC)   Hypertension   DVT (deep vein thrombosis) in pregnancy ([place])   Atrial fibrillation, chronic (HCC)   Depression   Protein-calorie malnutrition, moderate ([place])  N/V/D with abdominal pain: -No fever, no leukocytosis -CT ab/epl concerns for enteritis -Change impenem to rocephin/flagyl, continue hydration, hold diuretics, advance diet as tolerated. Stool c diff/gi pcr panel not collected, no bm today  Recurrent UTI? Patient is incontinence with urine, recent urine culture +klebsiella resistance to ampicillin but sensitive to the rest of abx tested, she was on imipenem on admission, currently on cipro. blood culture no growth, urine culture pending.  noninsulin Type II diabetes mellitus :  Last A1c 6.9 on 09/21/17, well controled. Patient is taking [place] at home. Carb modified diet   H/o paroxysmal Atrial Fibrillation: CHA2DS2-VASc Score is 5,  Patient is on Eliquis which is continued  sinus bradycardia heart rate high 50's to low 60's here -tele monitoring   H/o DVT: -on Eliquis  HTN: bp 113/70 -hold lasix since pt need IVF -IV hydralazine prn   CKD (chronic kidney disease), stage III ([place]): Stable. Baseline creatinine 1.2-1.5. Her creatinine is 1.38, BUN 29 -Follow up Renal function by BMP     Code Status: DNR  Family Communication: patient   Disposition Plan: pt recommended SNF, patient is not so sure about snf, Education officer, museum and case manager  consulted   Consultants:  Social worker  Case manager  Procedures:  none  Antibiotics:  Meropenem from admission to 12/16  rocephin/flagyl from 12/16   Objective: BP (!) 147/76 (BP Location: Left Arm)   Pulse (!) 55   Temp (!) 97.5 F (36.4 C) (Oral)   Resp 17   Ht 5' 4"  (1.626 m)   Wt 55.8 kg (123 lb)   SpO2 100%   BMI 21.11 kg/m   Intake/Output Summary (Last 24 hours) at 11/23/2017 1416 Last data filed at 11/23/2017 2671 Gross per 24 hour  Intake 2871.66 ml  Output 350 ml  Net 2521.66 ml   Filed Weights   11/22/17 0100  Weight: 55.8 kg (123 lb)    Exam: Patient is examined daily including today on 11/23/2017, exams remain the same as of yesterday except that has changed    General:  Frail, but NAD, aaox3  Cardiovascular: RRR  Respiratory: CTABL  Abdomen: Soft/ND/NT, positive BS  Musculoskeletal: No Edema  Neuro: alert, oriented   Data Reviewed: Basic Metabolic Panel: Recent Labs  Lab 11/22/17 0104 11/23/17 0437  NA 141 138  K 4.2 4.1  CL 106 106  CO2 27 24  GLUCOSE 125* 111*  BUN 29* 21*  CREATININE 1.38* 1.20*  CALCIUM 9.0 8.4*  MG  --  1.7   Liver Function Tests: Recent Labs  Lab 11/22/17 0104  AST 26  ALT 13*  ALKPHOS 110  BILITOT 0.9  PROT 6.1*  ALBUMIN 3.2*   Recent Labs  Lab 11/22/17 0104  LIPASE 32   No results for input(s): AMMONIA in the last 168 hours. CBC:  Recent Labs  Lab 11/22/17 0104 11/23/17 0437  WBC 6.3 5.9  NEUTROABS  --  3.7  HGB 10.8* 10.9*  HCT 33.7* 34.1*  MCV 93.4 93.4  PLT 228 193   Cardiac Enzymes:   No results for input(s): CKTOTAL, CKMB, CKMBINDEX, TROPONINI in the last 168 hours. BNP (last 3 results) No results for input(s): BNP in the last 8760 hours.  ProBNP (last 3 results) No results for input(s): PROBNP in the last 8760 hours.  CBG: Recent Labs  Lab 11/22/17 0736 11/22/17 1130 11/22/17 1621  GLUCAP 98 87 107*    Recent Results (from the past 240 hour(s))   Culture, blood (Routine X 2) w Reflex to ID Panel     Status: None (Preliminary result)   Collection Time: 11/22/17  5:20 AM  Result Value Ref Range Status   Specimen Description BLOOD LEFT FOREARM  Final   Special Requests IN PEDIATRIC BOTTLE Blood Culture adequate volume  Final   Culture   Final    NO GROWTH 1 DAY Performed at [facility], [place] [address], [place], [place] [address]    Report Status PENDING  Incomplete  Culture, blood (Routine X 2) w Reflex to ID Panel     Status: None (Preliminary result)   Collection Time: 11/22/17  5:25 AM  Result Value Ref Range Status   Specimen Description BLOOD LEFT HAND  Final   Special Requests   Final    BOTTLES DRAWN AEROBIC AND ANAEROBIC Blood Culture adequate volume   Culture   Final    NO GROWTH 1 DAY Performed at [facility], [place] [address], [place], [place] [address]    Report Status PENDING  Incomplete     Studies: No results found.  Scheduled Meds: . apixaban  2.5 mg Oral BID  . feeding supplement (ENSURE ENLIVE)  237 mL Oral BID BM  . pantoprazole  40 mg Oral Daily  . ropinirole  5 mg Oral QHS    Continuous Infusions: . cefTRIAXone (ROCEPHIN)  IV Stopped (11/22/17 2212)  . metronidazole Stopped (11/23/17 1347)     Time spent: 25 mins  I have personally reviewed and interpreted on  11/23/2017 daily labs, tele strips, imagings as discussed above under date review session and assessment and plans.  I reviewed all nursing notes, pharmacy notes, consultant notes,  vitals, pertinent old records  I have discussed plan of care as described above with RN , patient on 11/23/2017   Florencia Reasons MD, PhD  [facility] Pager [phone]. If 7PM-7AM, please contact night-coverage at [email or web address], password [facility] 11/23/2017, 2:16 PM  LOS: 1 day

## 2017-11-24 LAB — GASTROINTESTINAL PANEL BY PCR, STOOL (REPLACES STOOL CULTURE)

## 2017-11-24 LAB — MAGNESIUM: Magnesium: 1.8 mg/dL (ref 1.7–2.4)

## 2017-11-24 LAB — BASIC METABOLIC PANEL
Anion gap: 7 (ref 5–15)
BUN: 15 mg/dL (ref 6–20)
CHLORIDE: 107 mmol/L (ref 101–111)
CO2: 24 mmol/L (ref 22–32)
Calcium: 8.6 mg/dL — ABNORMAL LOW (ref 8.9–10.3)
Creatinine, Ser: 1.1 mg/dL — ABNORMAL HIGH (ref 0.44–1.00)
GFR calc Af Amer: 48 mL/min — ABNORMAL LOW (ref >60)
GFR calc non Af Amer: 41 mL/min — ABNORMAL LOW (ref >60)
GLUCOSE: 124 mg/dL — AB (ref 65–99)
POTASSIUM: 4.5 mmol/L (ref 3.5–5.1)
Sodium: 138 mmol/L (ref 135–145)

## 2017-11-24 MED ORDER — FUROSEMIDE 20 MG PO TABS: 20.0000 mg | ORAL_TABLET | ORAL | 0 refills | Status: DC | PRN

## 2017-11-24 MED ORDER — CEFPODOXIME PROXETIL 200 MG PO TABS
200.0000 mg | ORAL_TABLET | Freq: Every day | ORAL | Status: DC
  Filled 2017-11-24: qty 1

## 2017-11-24 MED ORDER — METRONIDAZOLE 500 MG PO TABS: 500.0000 mg | ORAL_TABLET | Freq: Three times a day (TID) | ORAL | 0 refills | Status: AC

## 2017-11-24 MED ORDER — POLYVINYL ALCOHOL 1.4 % OP SOLN: 1.0000 [drp] | OPHTHALMIC | 0 refills | Status: AC | PRN

## 2017-11-24 MED ORDER — CEFPODOXIME PROXETIL 200 MG PO TABS: 200.0000 mg | ORAL_TABLET | Freq: Two times a day (BID) | ORAL | 0 refills | Status: AC

## 2017-11-24 MED ORDER — CRANBERRY 250 MG PO CAPS: 1.0000 | ORAL_CAPSULE | Freq: Every day | ORAL | 0 refills | Status: DC

## 2017-11-24 MED ORDER — POLYVINYL ALCOHOL 1.4 % OP SOLN
1.0000 [drp] | OPHTHALMIC | Status: DC | PRN
  Administered 2017-11-24: 1 [drp] via OPHTHALMIC
  Filled 2017-11-24: qty 15

## 2017-11-24 NOTE — Care Management Note (Signed)
Case Management Note  Patient Details  Name: [name] MRN: [id number] Date of Birth: 15-Dec-1918  Subjective/Objective:   Pt admitted with  UTI                Action/Plan:  Pt discharging home with [facility]   Expected Discharge Date:  11/24/17               Expected Discharge Plan:  [address]  In-House Referral:  Clinical Social Work  Discharge planning Services  CM Consult  Post Acute Care Choice:    Choice offered to:  Patient  DME Arranged:    DME Agency:     HH Arranged:  RN, PT, OT, Nurse's Aide, Social Work [employer] Agency:  [employer] (now [facility])  Status of Service:  Completed, signed off  If discussed at [employer], dates discussed:    Additional CommentsPurcell Mouton, RN 11/24/2017, 1:06 PM

## 2017-11-24 NOTE — Clinical Social Work Note (Signed)
Clinical Social Work Assessment  Patient Details  Name: [name] MRN: [id number] Date of Birth: June 20, 1919  Date of referral:  11/24/17               Reason for consult:  Facility Placement                Permission sought to share information with:  Facility Art therapist granted to share information::  No  Name::        Agency::     Relationship::     Contact Information:     Housing/Transportation Living arrangements for the past 2 months:  Apartment(Condo) Source of Information:  Patient Patient Interpreter Needed:  None Criminal Activity/Legal Involvement Pertinent to Current Situation/Hospitalization:  No - Comment as needed Significant Relationships:  Friend Lives with:  Self Do you feel safe going back to the place where you live?  Yes(PT recommending SNF) Need for family participation in patient care:  No (Coment)  Care giving concerns:  Patient from home alone. Patient reported that she has a friend/staff member that cleans her home, gets her groceries and takes her to run errands. Patient reported that she was independent with ADLs prior to hospitalization. Patient reported that she has 2 walkers at home and walking sticks that she uses to help her ambulate. PT recommending SNF.   Social Worker assessment / plan:  CSW spoke with patient at bedside regarding PT recommendation for SNF. Patient declined and reported that she will be returning home. Patient reported that her PCP recently arranged for her to get HHPT 2x/week for 5 weeks. Patient reported that she has been to SNF in the past for rehab and that she prefers to return home. CSW agreed to inform patient's RNCM that patient plans to return home and may need home health services, patient's agreeable.   CSW informed patient's RNCM that patient may need home health services, patient's RNCM agreed to follow up.  CSW signing off, no other needs identified at this time.   Employment status:   Retired Nurse, adult PT Recommendations:  [place] / Referral to community resources:  Other (Comment Required)(RNCM referral for home health service needs)  Patient/Family's Response to care:  Patient declined SNF for ST rehab. Patient appreciative of CSW speaking with patient about discharge options.   Patient/Family's Understanding of and Emotional Response to Diagnosis, Current Treatment, and Prognosis:  Patient presented calm and verbalized plan to discharge home. Patient verbalized understanding that PT recommended SNF but that she is not agreeable. CSW inquired about patient's family/support system, patient reported that she didn't want to talk about it. Patient reported that she feels safe returning home.   Emotional Assessment Appearance:  Appears stated age Attitude/Demeanor/Rapport:    Affect (typically observed):  Calm Orientation:  Oriented to Self, Oriented to Place, Oriented to  Time, Oriented to Situation Alcohol / Substance use:  Not Applicable Psych involvement (Current and /or in the community):  No (Comment)  Discharge Needs  Concerns to be addressed:  Care Coordination Readmission within the last 30 days:  No Current discharge risk:  Physical Impairment Barriers to Discharge:  No Barriers Identified   Burnis Medin, LCSW 11/24/2017, 2:34 PM

## 2017-11-24 NOTE — Progress Notes (Signed)
Patient states ensure 'runs right through me'.  Will attempt to touch base with dietician to see if Resource [facility] Revels is appropriate for patient.  Patient did eat some cereal and have some OJ for breakfast with no c/o nausea.  Will continue to monitor.

## 2017-11-24 NOTE — Discharge Summary (Signed)
Discharge Summary  [name] XLK:[id number] DOB: Mar 05, 1919  PCP: [name] Deutscher, DO  Admit date: 11/22/2017 Discharge date: 11/24/2017  Time spent: >30mns, more than 50% time spent on coordination of care.  Recommendations for Outpatient Follow-up:  1. F/u with PMD within a week  for hospital discharge follow up, repeat cbc/bmp at follow up 2. Home health arranged, patient refused snf placement.  Discharge Diagnoses:  Active Hospital Problems   Diagnosis Date Noted  . UTI (urinary tract infection) 03/02/2015  . CKD (chronic kidney disease), stage III ([facility] 11/22/2017  . Hypertension 11/22/2017  . DVT (deep vein thrombosis) in pregnancy ([facility] [place] 11/22/2017  . Atrial fibrillation, chronic ([facility] 11/22/2017  . Depression 11/22/2017  . Protein-calorie malnutrition, moderate ([facility] 11/22/2017  . Abdominal pain 06/12/2015  . Nausea vomiting and diarrhea   . Type II diabetes mellitus with renal manifestations ([facility] 04/11/2014  . Anemia of chronic disease 04/03/2014  . GERD (gastroesophageal reflux disease) 03/09/2008    Resolved Hospital Problems  No resolved problems to display.    Discharge Condition: stable  Diet recommendation: heart healthy/carb modified  Filed Weights   11/22/17 0100  Weight: 55.8 kg (123 lb)    History of present illness:  PCP: WBriscoe Deutscher DO   Patient coming from:  The patient is coming from home.  At baseline, pt is independent for most of ADL.   Chief Complaint: nausea, vomiting, diarrhea, abdominal pain, urinary incontinence  HPI: [name] a 83y.o. female with medical history significant of Hypertension, diabetes mellitus, GERD, hypothyroidism, depression, anxiety, OSA, PMR, DVT, atrial fibrillation Eliquis, anemia, CKD-3, who presents with nausea, vomiting, diarrhea, abdominal pain or urinary incontinence.  Patient states that she started having worsening nausea, vomiting, abdominal pain and diarrhea since Saturday. She  vomited twice with clear materials. She had had two watery bowel movement today. Her abdominal pain is diffuse, constant, moderate, nonradiating. No fever or chills. Patient does not have chest pain, SOB, cough, fever or chills. She said that she has urinary incontinence, but does not know if she has any dysuria or burning on urination. Patient moves all extremities normally.   ED Course: pt was found to have Positive urinalysis with large amount of leukocytes and positive nitrite, WBC 6.3, stable renal function, temperature normal, bradycardia, oxygen saturation 96% on room air. Patient is admitted to telemetry bed as inpatient.    Hospital Course:  Principal Problem:   UTI (urinary tract infection) Active Problems:   GERD (gastroesophageal reflux disease)   Anemia of chronic disease   Type II diabetes mellitus with renal manifestations (HCC)   Nausea vomiting and diarrhea   Abdominal pain   CKD (chronic kidney disease), stage III (HCC)   Hypertension   DVT (deep vein thrombosis) in pregnancy ([facility]   Atrial fibrillation, chronic (HCC)   Depression   Protein-calorie malnutrition, moderate ([facility]   N/V/D with abdominal pain: -No fever, no leukocytosis -CT ab/pel on admission" Diverticulosis without diverticulitis. Fluid within the right colon. Few loops of minimally prominent proximal jejunum are noted without definitive obstructive change. A mild enteritis could not be totally excluded" -she is started on impenem, anx changed to rocephin/flagyl,  -she received hydration, hold diuretics, -Stool c diff/gi pcr panel not collected, no bm today -she has improved, tolerated diet advancement, no abdominal pain, no n/v at discharge. She is discharged on vantin and flagyl for 454mo days to finish total of 7 days abx treatment. -She is to follow with pmd closely.   Recurrent  UTI? -Patient is incontinence with urine,  -she has repeated positive urine culture with klebsiella  in the past ,  most recent urine culture from 11/25 +klebsiella resistance to ampicillin but sensitive to the rest of abx tested, urine culture this time again + klebsiella, she is likely colonized with the bacteria.  -she does c/o ab pain, n/v, and has elevated cr on admission -blood culture no growth -she is already on abx for possible enteritis.   noninsulin Type II diabetes mellitus : Last A1c6.9 on 09/21/17, well controled. Patient is [place] home. Carb modified diet   H/o paroxysmal Atrial Fibrillation: CHA2DS2-VASc Scoreis 5,  Patient is on Eliquis which is continued  sinus bradycardia heart rate high 50's to low 60's here   H/o DVT: -on Eliquis  HTN:bp 113/70 -hold lasix since pt needs IVF -lasix changed to prn for edema   CKD (chronic kidney disease), stage III ([place]): Bun 29 on admission, bun 15 at discharge  cr 1.38 on admission, 1.10 at discharge. She seems dehydrated on admission, improved, lasix changed to prn basis for edema pmd to monitor renal function .     Code Status: DNR  Family Communication: patient   Disposition Plan: patient declined SNF, she is discharged on home health.   Consultants:  Social worker  Case manager  Procedures:  none  Antibiotics:  Meropenem from admission to 12/16  rocephin/flagyl from 12/16   Discharge Exam: BP 120/61 (BP Location: Left Arm)   Pulse (!) 58   Temp (!) 97.3 F (36.3 C) (Oral)   Resp 18   Ht 5' 4"  (1.626 m)   Wt 55.8 kg (123 lb)   SpO2 100%   BMI 21.11 kg/m    General:  Frail, but NAD, aaox3  Cardiovascular: RRR  Respiratory: CTABL  Abdomen: Soft/ND/NT, positive BS  Musculoskeletal: No Edema  Neuro: alert, oriented    Discharge Instructions You were cared for by a hospitalist during your hospital stay. If you have any questions about your discharge medications or the care you received while you were in the hospital after you are discharged, you can call the unit  and asked to speak with the hospitalist on call if the hospitalist that took care of you is not available. Once you are discharged, your primary care physician will handle any further medical issues. Please note that NO REFILLS for any discharge medications will be authorized once you are discharged, as it is imperative that you return to your primary care physician (or establish a relationship with a primary care physician if you do not have one) for your aftercare needs so that they can reassess your need for medications and monitor your lab values.  Discharge Instructions    Diet - low sodium heart healthy   Complete by:  As directed    Carb modified   Increase activity slowly   Complete by:  As directed      Allergies as of 11/24/2017      Reactions   Sulfa Antibiotics Nausea And Vomiting   Penicillins Hives, Rash   ++tolerates Ceftriaxone and keflex++ Has patient had a PCN reaction causing immediate rash, facial/tongue/throat swelling, SOB or lightheadedness with hypotension: Unknown Has patient had a PCN reaction causing severe rash involving mucus membranes or skin necrosis: Unknown Has patient had a PCN reaction that required hospitalization Unknown Has patient had a PCN reaction occurring within the last 10 years: No If all of the above answers are "NO", then may proceed with Cephalosporin use.  Medication List    STOP taking these medications   nitrofurantoin (macrocrystal-monohydrate) 100 MG capsule Commonly known as:  MACROBID   ondansetron 4 MG disintegrating tablet Commonly known as:  ZOFRAN ODT     TAKE these medications   apixaban 2.5 MG Tabs tablet Commonly known as:  ELIQUIS Take 1 tablet (2.5 mg total) by mouth 2 (two) times daily.   cefpodoxime 200 MG tablet Commonly known as:  VANTIN Take 1 tablet (200 mg total) by mouth every 12 (twelve) hours for 4 days.   Cranberry 250 MG Caps Take 1 capsule (250 mg total) by mouth daily.   furosemide 20 MG  tablet Commonly known as:  LASIX Take 1 tablet (20 mg total) by mouth every other day as needed for fluid or edema. What changed:    when to take this  reasons to take this   metroNIDAZOLE 500 MG tablet Commonly known as:  FLAGYL Take 1 tablet (500 mg total) by mouth 3 (three) times daily for 4 days.   ondansetron 4 MG tablet Commonly known as:  ZOFRAN Take 1 tablet (4 mg total) by mouth every 8 (eight) hours as needed for nausea or vomiting.   pantoprazole 40 MG tablet Commonly known as:  PROTONIX Take 40 mg by mouth daily.   polyvinyl alcohol 1.4 % ophthalmic solution Commonly known as:  LIQUIFILM TEARS Place 1 drop into both eyes as needed for dry eyes.   ropinirole 5 MG tablet Commonly known as:  REQUIP Take 5 mg by mouth at bedtime.   sitaGLIPtin 25 MG tablet Commonly known as:  JANUVIA Take 25 mg daily by mouth.   triamcinolone ointment 0.5 % Commonly known as:  KENALOG Apply 1 application topically 2 (two) times daily. What changed:    when to take this  reasons to take this      Allergies  Allergen Reactions  . Sulfa Antibiotics Nausea And Vomiting  . Penicillins Hives and Rash    ++tolerates Ceftriaxone and keflex++ Has patient had a PCN reaction causing immediate rash, facial/tongue/throat swelling, SOB or lightheadedness with hypotension: Unknown Has patient had a PCN reaction causing severe rash involving mucus membranes or skin necrosis: Unknown Has patient had a PCN reaction that required hospitalization Unknown Has patient had a PCN reaction occurring within the last 10 years: No If all of the above answers are "NO", then may proceed with Cephalosporin use.    Follow-up Information    [name] Deutscher, DO Follow up in 1 week(s).   Specialty:  Family Medicine Why:  hospital discharge follow up.  Contact information: [place] [address] [phone]            The results of significant diagnostics from this  hospitalization (including imaging, microbiology, ancillary and laboratory) are listed below for reference.    Significant Diagnostic Studies: Ct Abdomen Pelvis Wo Contrast  Result Date: 11/22/2017 CLINICAL DATA:  Nausea and vomiting for 2 days, initial encounter EXAM: CT ABDOMEN AND PELVIS WITHOUT CONTRAST TECHNIQUE: Multidetector CT imaging of the abdomen and pelvis was performed following the standard protocol without IV contrast. COMPARISON:  10/24/2017 FINDINGS: Lower chest: Lung bases are free of acute infiltrate or sizable effusion. A large hiatal hernia is noted. Hepatobiliary: Small hypodensity is again noted within the liver stable from the previous exam. No gallstones, gallbladder wall thickening, or biliary dilatation. Pancreas: Mildly fatty infiltrated. The previously seen cystic lesion is again present but less prominent due to the lack of IV contrast.  Spleen: Normal in size without focal abnormality. Adrenals/Urinary Tract: Adrenal glands are within normal limits bilaterally. Kidneys are well visualized without evidence of renal calculi or obstructive changes. The bladder is well distended. Stomach/Bowel: Scattered diverticular change is noted within the sigmoid colon without evidence of diverticulitis. No obstructive or inflammatory changes are seen. Fluid is noted within the right colon consistent with the given clinical history. The appendix is not visualized although no inflammatory changes to suggest appendicitis are seen. A few loops of minimally prominent jejunum are noted without true obstructive change. Vascular/Lymphatic: Aortic atherosclerosis. No enlarged abdominal or pelvic lymph nodes. Reproductive: Uterus and bilateral adnexa are unremarkable. Other: No abdominal wall hernia or abnormality. No abdominopelvic ascites. Musculoskeletal: Postsurgical changes in the lumbar spine are noted. Stable anterolisthesis of L3 on L4 is noted. No acute bony abnormality is seen. IMPRESSION:  Diverticulosis without diverticulitis. Fluid within the right colon. Few loops of minimally prominent proximal jejunum are noted without definitive obstructive change. A mild enteritis could not be totally excluded. Chronic changes stable from the previous exam. Electronically Signed   By: Inez Catalina M.D.   On: 11/22/2017 07:09   Dg Chest 2 View  Result Date: 11/01/2017 CLINICAL DATA:  83 year old female with history of nausea, vomiting and diarrhea for the past 2 days. Weakness and chills. EXAM: CHEST  2 VIEW COMPARISON:  Chest x-ray 09/12/2016. FINDINGS: Lung volumes are normal. No consolidative airspace disease. No pleural effusions. No pneumothorax. No pulmonary nodule or mass noted. Pulmonary vasculature and the cardiomediastinal silhouette are within normal limits. Severe calcifications of the mitral annulus. Atherosclerosis in the thoracic aorta. IMPRESSION: 1.  No radiographic evidence of acute cardiopulmonary disease. 2. Aortic atherosclerosis. 3. Severe calcifications of the mitral annulus. Electronically Signed   By: Vinnie Langton M.D.   On: 11/01/2017 08:11    Microbiology: Recent Results (from the past 240 hour(s))  Urine culture     Status: Abnormal (Preliminary result)   Collection Time: 11/22/17  4:49 AM  Result Value Ref Range Status   Specimen Description URINE, CATHETERIZED  Final   Special Requests NONE  Final   Culture >=100,000 COLONIES/mL KLEBSIELLA PNEUMONIAE (A)  Final   Report Status PENDING  Incomplete  Culture, blood (Routine X 2) w Reflex to ID Panel     Status: None (Preliminary result)   Collection Time: 11/22/17  5:20 AM  Result Value Ref Range Status   Specimen Description BLOOD LEFT FOREARM  Final   Special Requests IN PEDIATRIC BOTTLE Blood Culture adequate volume  Final   Culture   Final    NO GROWTH 1 DAY Performed at [facility], [place] [address] [place], [place] [address]    Report Status PENDING  Incomplete  Culture, blood (Routine X 2) w  Reflex to ID Panel     Status: None (Preliminary result)   Collection Time: 11/22/17  5:25 AM  Result Value Ref Range Status   Specimen Description BLOOD LEFT HAND  Final   Special Requests   Final    BOTTLES DRAWN AEROBIC AND ANAEROBIC Blood Culture adequate volume   Culture   Final    NO GROWTH 1 DAY Performed at [facility], [place] [address], [place], [place] [address]    Report Status PENDING  Incomplete     Labs: Basic Metabolic Panel: Recent Labs  Lab 11/22/17 0104 11/23/17 0437 11/24/17 0434  NA 141 138 138  K 4.2 4.1 4.5  CL 106 106 107  CO2 27  24 24  GLUCOSE 125* 111* 124*  BUN 29* 21* 15  CREATININE 1.38* 1.20* 1.10*  CALCIUM 9.0 8.4* 8.6*  MG  --  1.7 1.8   Liver Function Tests: Recent Labs  Lab 11/22/17 0104  AST 26  ALT 13*  ALKPHOS 110  BILITOT 0.9  PROT 6.1*  ALBUMIN 3.2*   Recent Labs  Lab 11/22/17 0104  LIPASE 32   No results for input(s): AMMONIA in the last 168 hours. CBC: Recent Labs  Lab 11/22/17 0104 11/23/17 0437  WBC 6.3 5.9  NEUTROABS  --  3.7  HGB 10.8* 10.9*  HCT 33.7* 34.1*  MCV 93.4 93.4  PLT 228 193   Cardiac Enzymes: No results for input(s): CKTOTAL, CKMB, CKMBINDEX, TROPONINI in the last 168 hours. BNP: BNP (last 3 results) No results for input(s): BNP in the last 8760 hours.  ProBNP (last 3 results) No results for input(s): PROBNP in the last 8760 hours.  CBG: Recent Labs  Lab 11/22/17 0736 11/22/17 1130 11/22/17 1621  GLUCAP 98 87 107*       Signed:  Florencia Reasons MD, PhD  [facility] 11/24/2017, 1:05 PM

## 2017-11-24 NOTE — Telephone Encounter (Signed)
Tried to call the patient and the home voicemail is not set up.

## 2017-11-25 ENCOUNTER — Telehealth: Payer: Self-pay | Admitting: *Deleted

## 2017-11-25 LAB — URINE CULTURE: Culture: >=100000 — AB

## 2017-11-25 NOTE — Telephone Encounter (Signed)
Per chart review: Admit date: 11/22/2017 Discharge date: 11/24/2017  Time spent: >10mns, more than 50% time spent on coordination of care.  Recommendations for Outpatient Follow-up:  1. F/u with PMD within a week  for hospital discharge follow up, repeat cbc/bmp at follow up 2. Home health arranged, patient refused snf placement.  Discharge Diagnoses:      Active Hospital Problems   Diagnosis Date Noted  . UTI (urinary tract infection) 03/02/2015  . CKD (chronic kidney disease), stage III ([facility] 11/22/2017  . Hypertension 11/22/2017  . DVT (deep vein thrombosis) in pregnancy ([facility] 11/22/2017  . Atrial fibrillation, chronic ([facility] 11/22/2017  . Depression 11/22/2017  . Protein-calorie malnutrition, moderate ([facility] 11/22/2017  . Abdominal pain 06/12/2015  . Nausea vomiting and diarrhea   . Type II diabetes mellitus with renal manifestations ([facility] 04/11/2014  . Anemia of chronic disease 04/03/2014  . GERD (gastroesophageal reflux disease) 03/09/2008    Resolved Hospital Problems  No resolved problems to display.    Discharge Condition: stable  Diet recommendation: heart healthy/carb modified ______________________________________________________________________________________ Transition Care Management Follow-up Telephone Call   Date discharged? 11/24/17   How have you been since you were released from the hospital? "very very weak"   Do you understand why you were in the hospital? yes   Do you understand the discharge instructions? yes   Where were you discharged to? Home   Items Reviewed:  Medications reviewed: yes. Patient states that she has not started any of the medication that she hospital sent her home on.  Allergies reviewed: yes  Dietary changes reviewed: yes. Patient states that she has not had much of an appetite but is drinking fluids.  Referrals reviewed: yes   Functional Questionnaire:   Activities of Daily Living (ADLs):   She  states they are independent in the following: ambulation, bathing and hygiene, feeding, continence, grooming, toileting and dressing States they require assistance with the following: Patient states that she is living alone but her friend next door is helping get groceries and drive her places   Any transportation issues/concerns?: yes. Patients friend has to drive her to appointment and she cannot come on Monday due to an appointment with her lawyer.    Any patient concerns? no   Confirmed importance and date/time of follow-up visits scheduled yes  Provider Appointment booked with: NA. Patient declined to schedule until she discusses this with her friend.  Confirmed with patient if condition begins to worsen call PCP or go to the ER.  Patient was given the office number and encouraged to call back with question or concerns.  : yes

## 2017-11-26 ENCOUNTER — Emergency Department (HOSPITAL_COMMUNITY)
Admission: EM | Admit: 2017-11-26 | Discharge: 2017-11-26 | Disposition: A | Discharge status: Home / Self Care | Payer: Medicare Other | Attending: Emergency Medicine | Admitting: Emergency Medicine

## 2017-11-26 ENCOUNTER — Other Ambulatory Visit: Payer: Self-pay

## 2017-11-26 ENCOUNTER — Encounter (HOSPITAL_COMMUNITY): Payer: Self-pay | Admitting: Emergency Medicine

## 2017-11-26 ENCOUNTER — Telehealth: Payer: Self-pay | Admitting: Family Medicine

## 2017-11-26 DIAGNOSIS — Z880 Allergy status to penicillin: Secondary | ICD-10-CM | POA: Diagnosis not present

## 2017-11-26 DIAGNOSIS — I4891 Unspecified atrial fibrillation: Secondary | ICD-10-CM | POA: Diagnosis not present

## 2017-11-26 DIAGNOSIS — E1122 Type 2 diabetes mellitus with diabetic chronic kidney disease: Secondary | ICD-10-CM | POA: Insufficient documentation

## 2017-11-26 DIAGNOSIS — R197 Diarrhea, unspecified: Secondary | ICD-10-CM | POA: Insufficient documentation

## 2017-11-26 DIAGNOSIS — N184 Chronic kidney disease, stage 4 (severe): Secondary | ICD-10-CM | POA: Diagnosis not present

## 2017-11-26 DIAGNOSIS — Z882 Allergy status to sulfonamides status: Secondary | ICD-10-CM | POA: Insufficient documentation

## 2017-11-26 DIAGNOSIS — I129 Hypertensive chronic kidney disease with stage 1 through stage 4 chronic kidney disease, or unspecified chronic kidney disease: Secondary | ICD-10-CM | POA: Insufficient documentation

## 2017-11-26 DIAGNOSIS — E039 Hypothyroidism, unspecified: Secondary | ICD-10-CM | POA: Diagnosis not present

## 2017-11-26 DIAGNOSIS — Z79899 Other long term (current) drug therapy: Secondary | ICD-10-CM | POA: Insufficient documentation

## 2017-11-26 DIAGNOSIS — K5000 Crohn's disease of small intestine without complications: Secondary | ICD-10-CM | POA: Insufficient documentation

## 2017-11-26 LAB — URINALYSIS, ROUTINE W REFLEX MICROSCOPIC
BILIRUBIN URINE: NEGATIVE
GLUCOSE, UA: NEGATIVE mg/dL
KETONES UR: 5 mg/dL — AB
Nitrite: NEGATIVE
PROTEIN: NEGATIVE mg/dL
Specific Gravity, Urine: 1.009 (ref 1.005–1.030)
pH: 5 (ref 5.0–8.0)

## 2017-11-26 LAB — COMPREHENSIVE METABOLIC PANEL
ALBUMIN: 3.3 g/dL — AB (ref 3.5–5.0)
ALT: 13 U/L — AB (ref 14–54)
AST: 26 U/L (ref 15–41)
Alkaline Phosphatase: 105 U/L (ref 38–126)
Anion gap: 9 (ref 5–15)
BUN: 14 mg/dL (ref 6–20)
CHLORIDE: 108 mmol/L (ref 101–111)
CO2: 22 mmol/L (ref 22–32)
CREATININE: 1.22 mg/dL — AB (ref 0.44–1.00)
Calcium: 8.6 mg/dL — ABNORMAL LOW (ref 8.9–10.3)
GFR calc Af Amer: 42 mL/min — ABNORMAL LOW (ref >60)
GFR calc non Af Amer: 36 mL/min — ABNORMAL LOW (ref >60)
Glucose, Bld: 100 mg/dL — ABNORMAL HIGH (ref 65–99)
POTASSIUM: 3.5 mmol/L (ref 3.5–5.1)
SODIUM: 139 mmol/L (ref 135–145)
Total Bilirubin: 0.8 mg/dL (ref 0.3–1.2)
Total Protein: 6 g/dL — ABNORMAL LOW (ref 6.5–8.1)

## 2017-11-26 LAB — CBC
HEMATOCRIT: 33.9 % — AB (ref 36.0–46.0)
Hemoglobin: 11 g/dL — ABNORMAL LOW (ref 12.0–15.0)
MCH: 30.2 pg (ref 26.0–34.0)
MCHC: 32.4 g/dL (ref 30.0–36.0)
MCV: 93.1 fL (ref 78.0–100.0)
PLATELETS: 200 10*3/uL (ref 150–400)
RBC: 3.64 MIL/uL — ABNORMAL LOW (ref 3.87–5.11)
RDW: 13.5 % (ref 11.5–15.5)
WBC: 4 10*3/uL (ref 4.0–10.5)

## 2017-11-26 LAB — LIPASE, BLOOD: LIPASE: 29 U/L (ref 11–51)

## 2017-11-26 NOTE — Discharge Instructions (Signed)
It was our pleasure to provide your ER care today - we hope that you feel better.  Rest. Drink plenty of fluids.  Follow up with primary care doctor in the coming week if symptoms fail to improve/resolve.  Return to ER if worse, new symptoms, fevers, severe abdominal pain, persistent vomiting, other concern.

## 2017-11-26 NOTE — ED Triage Notes (Signed)
Pt verbalizes "diarrhea starting again this morning; concerned about it because it was black." Hx of admission for diarrhea per pt.

## 2017-11-26 NOTE — Telephone Encounter (Signed)
Called Patient states that she called EMS this am she is having black watery stools and weakness around 6:30am. They tried to get her to go to ED but she refused. Patient did take medications while EMS was their and ate some jello and a little water. After they left she vomited and had diarrhea one more time. She has not had anything to eat/drink after. She did not have way to check pulse and b/p while I was on the phone with her. Patient states that she is very weak. She did not have way to check temp.    Reviewed with Dr. Juleen China and per her advice I have called patient and instructed her to go to ED. She states that her friend is with her now and she will get ready and go.

## 2017-11-26 NOTE — Telephone Encounter (Signed)
Please be advised of the note below.  Copied from [place] [id number]. Topic: General - Other >> Nov 06, 2017 10:48 AM Patrice Paradise wrote: Reason for CRM: Darlina Guys from [facility]  called and just wanted to let Dr. Juleen China know they rec'd the referral for the patient home healthcare and that their RN would be going out to see the patient on Dec the 3rd.

## 2017-11-26 NOTE — ED Provider Notes (Signed)
[facility] DEPT Provider Note   CSN: [id number] Arrival date & time: 11/26/17  1241     History   Chief Complaint Chief Complaint  Patient presents with  . Diarrhea    HPI [name] is a 82 y.o. female.  Patient c/o diarrhea this AM. Single episode, loose to watery. Symptoms mild, persistent. States was dark in color. No blood. Pt notes hx recurrent diarrhea in past weeks. Denies abdominal pain. No vomiting. No abd pain. No fever or chills. No faintness or dizziness. ?recent abx for uti. No dysuria or current gu symptoms.    The history is provided by the patient.  Diarrhea   Pertinent negatives include no abdominal pain and no headaches.    Past Medical History:  Diagnosis Date  . Acute gastritis without mention of hemorrhage   . Anemia   . Anxiety   . Arthritis    "LLL; palm of right hand" (06/12/2015)  . Atrial fibrillation ([place])   . CAP (community acquired pneumonia) 04/03/2014  . Crohn disease ([place])   . Cyst and pseudocyst of pancreas   . Depression   . Diaphragmatic hernia without mention of obstruction or gangrene   . Diverticulosis of colon (without mention of hemorrhage)   . Duodenitis without mention of hemorrhage   . DVT (deep venous thrombosis) ([place])    "twice on the left leg; once on the right leg" (06/12/2015)  . Dysrhythmia   . Esophageal reflux   . Hiatal hernia- moderate to large 10/06/2014  . Hx pulmonary embolism   . Hypothyroidism   . Insomnia, unspecified   . Left leg DVT ([place]) jan 2013  . Migraine    hx  . OAB (overactive bladder)   . Other and unspecified hyperlipidemia   . Other malaise and fatigue   . Pancreas divisum 03/02/2015  . Pancreatic cyst   . Personal history of unspecified digestive disease   . Polymyalgia rheumatica ([place])   . Protein-calorie malnutrition, severe ([place]) 10/02/2014  . Restless legs syndrome (RLS)   . Shingles   . Stricture and stenosis of esophagus   . Type II diabetes  mellitus ([place])   . Unspecified essential hypertension   . Unspecified sleep apnea    hx of sleep apnea, none since weight loss (06/12/2015)  . Urinary frequency   . Urinary urgency     Patient Active Problem List   Diagnosis Date Noted  . CKD (chronic kidney disease), stage III ([place]) 11/22/2017  . Hypertension 11/22/2017  . DVT (deep vein thrombosis) in pregnancy ([place]) 11/22/2017  . Atrial fibrillation, chronic ([place]) 11/22/2017  . Depression 11/22/2017  . Protein-calorie malnutrition, moderate ([place]) 11/22/2017  . Dry skin dermatitis 09/24/2017  . Osteoarthritis 07/31/2017  . CKD stage 4 due to type 2 diabetes mellitus ([place]) 04/19/2017  . Iron deficiency anemia 04/19/2017  . B12 deficiency 04/08/2017  . Chronic constipation 04/08/2017  . Chronic deep vein thrombosis (DVT) of left lower extremity ([place]) 04/08/2017  . Episode of recurrent major depressive disorder ([place]) 04/08/2017  . Monoclonal gammopathy of unknown significance (MGUS) 04/08/2017  . Peripheral neuropathy 04/08/2017  . Rectal mass   . Abdominal pain 06/12/2015  . UTI (urinary tract infection) 03/02/2015  . Pancreas divisum 03/02/2015  . Physical deconditioning 03/01/2015  . Nausea vomiting and diarrhea   . Pancreatic cyst   . Hiatal hernia- moderate to large 10/06/2014  . Heme + stool 10/04/2014  . Crohn's ileocolitis ([place]) 10/04/2014  . 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  Arthritis   . Hx pulmonary embolism   . Hypothyroidism 04/11/2014  . Type II diabetes mellitus with renal manifestations ([place]) 04/11/2014  . Anemia of chronic disease 04/03/2014  . OAB (overactive bladder) 01/07/2012  . Restless leg syndrome 03/09/2008  . Benign essential hypertension 03/09/2008  . ESOPHAGEAL STRICTURE 03/09/2008  . GERD (gastroesophageal reflux disease) 03/09/2008  . Diverticulosis of large intestine 03/09/2008  . Polymyalgia rheumatica ([place]) 03/09/2008    Past Surgical History:  Procedure Laterality Date  . BACK SURGERY      . CATARACT EXTRACTION, BILATERAL Bilateral   . CLOSED REDUCTION NASAL FRACTURE  08/2008   Archie Endo 04/10/2011  . COLON SURGERY  June 17, 2012   surgery for bleed  . COLONOSCOPY N/A 06/13/2015   Procedure: COLONOSCOPY;  Surgeon: Irene Shipper, MD;  Location: [facility];  Service: Endoscopy;  Laterality: N/A;  . OOPHORECTOMY  1944   not sure which ovary  . POSTERIOR LUMBAR FUSION  01/2009   L4-L5 Archie Endo 04/09/2011  . REDUCTION MAMMAPLASTY Bilateral 1980  . ROTATOR CUFF REPAIR Right   . SHOULDER OPEN ROTATOR CUFF REPAIR  08/04/2012   Procedure: ROTATOR CUFF REPAIR SHOULDER OPEN;  Surgeon: Tobi Bastos, MD;  Location: [facility];  Service: Orthopedics;  Laterality: Left;  with Anchors and Graft  . SHOULDER OPEN ROTATOR CUFF REPAIR Right 2006   Archie Endo 04/22/2011  . TONSILLECTOMY      OB History    No data available       Home Medications    Prior to Admission medications   Medication Sig Start Date End Date Taking? Authorizing Provider  apixaban (ELIQUIS) 2.5 MG TABS tablet Take 1 tablet (2.5 mg total) by mouth 2 (two) times daily. 10/09/14   Regalado, Belkys A, MD  cefpodoxime (VANTIN) 200 MG tablet Take 1 tablet (200 mg total) by mouth every 12 (twelve) hours for 4 days. 11/24/17 11/28/17  Florencia Reasons, MD  Cranberry 250 MG CAPS Take 1 capsule (250 mg total) by mouth daily. 11/24/17 12/24/17  Florencia Reasons, MD  furosemide (LASIX) 20 MG tablet Take 1 tablet (20 mg total) by mouth every other day as needed for fluid or edema. 11/24/17   Florencia Reasons, MD  metroNIDAZOLE (FLAGYL) 500 MG tablet Take 1 tablet (500 mg total) by mouth 3 (three) times daily for 4 days. 11/24/17 11/28/17  Florencia Reasons, MD  ondansetron (ZOFRAN) 4 MG tablet Take 1 tablet (4 mg total) by mouth every 8 (eight) hours as needed for nausea or vomiting. Patient not taking: Reported on 11/03/2017 11/01/17   Mesner, Corene Cornea, MD  pantoprazole (PROTONIX) 40 MG tablet Take 40 mg by mouth daily.    [provider]  polyvinyl alcohol (LIQUIFILM  TEARS) 1.4 % ophthalmic solution Place 1 drop into both eyes as needed for dry eyes. 11/24/17   Florencia Reasons, MD  ropinirole (REQUIP) 5 MG tablet Take 5 mg by mouth at bedtime.    [provider]  sitaGLIPtin (JANUVIA) 25 MG tablet Take 25 mg daily by mouth.    [provider]  triamcinolone ointment (KENALOG) 0.5 % Apply 1 application topically 2 (two) times daily. Patient taking differently: Apply 1 application 2 (two) times daily as needed topically (irritation,rash).  09/21/17   Briscoe Deutscher, DO    Family History Family History  Problem Relation Age of Onset  . Melanoma Daughter        died age 76  . Melanoma Son     Social History Social History  Tobacco Use  . Smoking status: Never Smoker  . Smokeless tobacco: Never Used  Substance Use Topics  . Alcohol use: No  . Drug use: No     Allergies   Sulfa antibiotics and Penicillins   Review of Systems Review of Systems  Constitutional: Negative for fever.  HENT: Negative for sore throat.   Eyes: Negative for redness.  Respiratory: Negative for shortness of breath.   Cardiovascular: Negative for chest pain.  Gastrointestinal: Positive for diarrhea. Negative for abdominal pain.  Endocrine: Negative for polyuria.  Genitourinary: Negative for dysuria and flank pain.  Musculoskeletal: Negative for back pain.  Skin: Negative for rash.  Neurological: Negative for headaches.  Hematological: Does not bruise/bleed easily.  Psychiatric/Behavioral: Negative for confusion.     Physical Exam Updated Vital Signs BP 109/60 (BP Location: Right Arm)   Pulse 81   Temp 98 F (36.7 C) (Oral)   Resp 16   Ht 1.524 m (5')   Wt 54.4 kg (120 lb)   SpO2 100%   BMI 23.44 kg/m   Physical Exam  Constitutional: She appears well-developed and well-nourished. No distress.  HENT:  Mouth/Throat: Oropharynx is clear and moist.  Eyes: Conjunctivae are normal. No scleral icterus.  Neck: Neck supple. No tracheal deviation  present.  Cardiovascular: Normal rate, regular rhythm, normal heart sounds and intact distal pulses.  Pulmonary/Chest: Effort normal and breath sounds normal. No respiratory distress.  Abdominal: Soft. Normal appearance and bowel sounds are normal. She exhibits no distension. There is no tenderness.  Genitourinary:  Genitourinary Comments: No cva tenderness. Rectal with moderate loose yellow/light brown stool.   Musculoskeletal: She exhibits no edema.  Neurological: She is alert.  Skin: Skin is warm and dry. No rash noted. She is not diaphoretic.  Psychiatric: She has a normal mood and affect.  Nursing note and vitals reviewed.    ED Treatments / Results  Labs (all labs ordered are listed, but only abnormal results are displayed) Results for orders placed or performed during the hospital encounter of 11/26/17  Lipase, blood  Result Value Ref Range   Lipase 29 11 - 51 U/L  Comprehensive metabolic panel  Result Value Ref Range   Sodium 139 135 - 145 mmol/L   Potassium 3.5 3.5 - 5.1 mmol/L   Chloride 108 101 - 111 mmol/L   CO2 22 22 - 32 mmol/L   Glucose, Bld 100 (H) 65 - 99 mg/dL   BUN 14 6 - 20 mg/dL   Creatinine, Ser 1.22 (H) 0.44 - 1.00 mg/dL   Calcium 8.6 (L) 8.9 - 10.3 mg/dL   Total Protein 6.0 (L) 6.5 - 8.1 g/dL   Albumin 3.3 (L) 3.5 - 5.0 g/dL   AST 26 15 - 41 U/L   ALT 13 (L) 14 - 54 U/L   Alkaline Phosphatase 105 38 - 126 U/L   Total Bilirubin 0.8 0.3 - 1.2 mg/dL   GFR calc non Af Amer 36 (L) >60 mL/min   GFR calc Af Amer 42 (L) >60 mL/min   Anion gap 9 5 - 15  CBC  Result Value Ref Range   WBC 4.0 4.0 - 10.5 K/uL   RBC 3.64 (L) 3.87 - 5.11 MIL/uL   Hemoglobin 11.0 (L) 12.0 - 15.0 g/dL   HCT 33.9 (L) 36.0 - 46.0 %   MCV 93.1 78.0 - 100.0 fL   MCH 30.2 26.0 - 34.0 pg   MCHC 32.4 30.0 - 36.0 g/dL   RDW 13.5 11.5 - 15.5 %  Platelets 200 150 - 400 K/uL  Urinalysis, Routine w reflex microscopic  Result Value Ref Range   Color, Urine YELLOW YELLOW   APPearance  CLOUDY (A) CLEAR   Specific Gravity, Urine 1.009 1.005 - 1.030   pH 5.0 5.0 - 8.0   Glucose, UA NEGATIVE NEGATIVE mg/dL   Hgb urine dipstick SMALL (A) NEGATIVE   Bilirubin Urine NEGATIVE NEGATIVE   Ketones, ur 5 (A) NEGATIVE mg/dL   Protein, ur NEGATIVE NEGATIVE mg/dL   Nitrite NEGATIVE NEGATIVE   Leukocytes, UA TRACE (A) NEGATIVE   RBC / HPF TOO NUMEROUS TO COUNT 0 - 5 RBC/hpf   WBC, UA TOO NUMEROUS TO COUNT 0 - 5 WBC/hpf   Bacteria, UA FEW (A) NONE SEEN   Squamous Epithelial / LPF 0-5 (A) NONE SEEN   WBC Clumps PRESENT    Mucus PRESENT    Budding Yeast PRESENT    Hyaline Casts, UA PRESENT    Ct Abdomen Pelvis Wo Contrast  Result Date: 11/22/2017 CLINICAL DATA:  Nausea and vomiting for 2 days, initial encounter EXAM: CT ABDOMEN AND PELVIS WITHOUT CONTRAST TECHNIQUE: Multidetector CT imaging of the abdomen and pelvis was performed following the standard protocol without IV contrast. COMPARISON:  10/24/2017 FINDINGS: Lower chest: Lung bases are free of acute infiltrate or sizable effusion. A large hiatal hernia is noted. Hepatobiliary: Small hypodensity is again noted within the liver stable from the previous exam. No gallstones, gallbladder wall thickening, or biliary dilatation. Pancreas: Mildly fatty infiltrated. The previously seen cystic lesion is again present but less prominent due to the lack of IV contrast. Spleen: Normal in size without focal abnormality. Adrenals/Urinary Tract: Adrenal glands are within normal limits bilaterally. Kidneys are well visualized without evidence of renal calculi or obstructive changes. The bladder is well distended. Stomach/Bowel: Scattered diverticular change is noted within the sigmoid colon without evidence of diverticulitis. No obstructive or inflammatory changes are seen. Fluid is noted within the right colon consistent with the given clinical history. The appendix is not visualized although no inflammatory changes to suggest appendicitis are seen. A  few loops of minimally prominent jejunum are noted without true obstructive change. Vascular/Lymphatic: Aortic atherosclerosis. No enlarged abdominal or pelvic lymph nodes. Reproductive: Uterus and bilateral adnexa are unremarkable. Other: No abdominal wall hernia or abnormality. No abdominopelvic ascites. Musculoskeletal: Postsurgical changes in the lumbar spine are noted. Stable anterolisthesis of L3 on L4 is noted. No acute bony abnormality is seen. IMPRESSION: Diverticulosis without diverticulitis. Fluid within the right colon. Few loops of minimally prominent proximal jejunum are noted without definitive obstructive change. A mild enteritis could not be totally excluded. Chronic changes stable from the previous exam. Electronically Signed   By: Inez Catalina M.D.   On: 11/22/2017 07:09   Dg Chest 2 View  Result Date: 11/01/2017 CLINICAL DATA:  83 year old female with history of nausea, vomiting and diarrhea for the past 2 days. Weakness and chills. EXAM: CHEST  2 VIEW COMPARISON:  Chest x-ray 09/12/2016. FINDINGS: Lung volumes are normal. No consolidative airspace disease. No pleural effusions. No pneumothorax. No pulmonary nodule or mass noted. Pulmonary vasculature and the cardiomediastinal silhouette are within normal limits. Severe calcifications of the mitral annulus. Atherosclerosis in the thoracic aorta. IMPRESSION: 1.  No radiographic evidence of acute cardiopulmonary disease. 2. Aortic atherosclerosis. 3. Severe calcifications of the mitral annulus. Electronically Signed   By: Vinnie Langton M.D.   On: 11/01/2017 08:11    EKG  EKG Interpretation None       Radiology  No results found.  Procedures Procedures (including critical care time)  Medications Ordered in ED Medications - No data to display   Initial Impression / Assessment and Plan / ED Course  I have reviewed the triage vital signs and the nursing notes.  Pertinent labs & imaging results that were available during my  care of the patient were reviewed by me and considered in my medical decision making (see chart for details).  Iv ns. Labs.  Reviewed nursing notes and prior charts for additional history.   During 2.5 hrs in ED, no episodes of diarrhea, and only single episode today.  abd soft nt. Tolerating po.  Nursing sent ua.  Patient denies urinary symptoms, no dysuria or urgency. Patient denies fever or chills. On review chart, hx colonization.  Given no urinary symptoms or fever, and current/recent diarrhea, it appears additional abx would be counterproductive and not indicated.   Patient appears stable for d/c.     Final Clinical Impressions(s) / ED Diagnoses   Final diagnoses:  None    ED Discharge Orders    None       Lajean Saver, MD 11/26/17 [phone]

## 2017-11-26 NOTE — Telephone Encounter (Signed)
Patient has been in the hospital 

## 2017-11-26 NOTE — Telephone Encounter (Signed)
FYI

## 2017-11-27 ENCOUNTER — Telehealth: Payer: Self-pay | Admitting: Radiology

## 2017-11-27 LAB — CULTURE, BLOOD (ROUTINE X 2)
CULTURE: NO GROWTH
Culture: NO GROWTH
Special Requests: ADEQUATE
Special Requests: ADEQUATE

## 2017-11-27 NOTE — Telephone Encounter (Signed)
Copied from [place] [id number]. Topic: General - Other >> Nov 06, 2017 10:48 AM Patrice Paradise wrote: Reason for CRM: Darlina Guys from [facility]  called and just wanted to let Dr. Juleen China know they rec'd the referral for the patient home healthcare and that their RN would be going out to see the patient on Dec the 3rd.  >> Nov 26, 2017 10:45 AM Tye Maryland wrote: Darlina Guys from [facility] called to inform that the nurse will be going to the home starting tomorrow to give services, contact kesia if needed >> Nov 27, 2017  2:23 PM Oliver Pila B wrote: [facility] called to say pt was not able to be seen today b/c pt had an appt w/ her lawyer but will go back out on the 26th to see her

## 2017-11-27 NOTE — Telephone Encounter (Signed)
FYI

## 2017-12-03 ENCOUNTER — Telehealth: Payer: Self-pay | Admitting: Family Medicine

## 2017-12-03 NOTE — Telephone Encounter (Signed)
Copied from [place] [id number]. Topic: General - Other >> Dec 03, 2017 10:19 AM Lennox Solders wrote: Reason for CRM: kecia lpn [facility] is calling to let md know they will see patient today for physical therapy to resume care

## 2017-12-03 NOTE — Telephone Encounter (Signed)
FYI

## 2017-12-04 ENCOUNTER — Telehealth: Payer: Self-pay | Admitting: Family Medicine

## 2017-12-04 ENCOUNTER — Encounter (HOSPITAL_COMMUNITY): Payer: Self-pay | Admitting: Emergency Medicine

## 2017-12-04 ENCOUNTER — Emergency Department (HOSPITAL_COMMUNITY): Payer: Medicare Other

## 2017-12-04 ENCOUNTER — Emergency Department (HOSPITAL_COMMUNITY)
Admission: EM | Admit: 2017-12-04 | Discharge: 2017-12-04 | Disposition: A | Discharge status: Home / Self Care | Payer: Medicare Other | Attending: Emergency Medicine | Admitting: Emergency Medicine

## 2017-12-04 ENCOUNTER — Other Ambulatory Visit: Payer: Self-pay

## 2017-12-04 DIAGNOSIS — E039 Hypothyroidism, unspecified: Secondary | ICD-10-CM | POA: Insufficient documentation

## 2017-12-04 DIAGNOSIS — I129 Hypertensive chronic kidney disease with stage 1 through stage 4 chronic kidney disease, or unspecified chronic kidney disease: Secondary | ICD-10-CM | POA: Diagnosis not present

## 2017-12-04 DIAGNOSIS — Z7984 Long term (current) use of oral hypoglycemic drugs: Secondary | ICD-10-CM | POA: Diagnosis not present

## 2017-12-04 DIAGNOSIS — Z79899 Other long term (current) drug therapy: Secondary | ICD-10-CM | POA: Diagnosis not present

## 2017-12-04 DIAGNOSIS — Z7901 Long term (current) use of anticoagulants: Secondary | ICD-10-CM | POA: Diagnosis not present

## 2017-12-04 DIAGNOSIS — E1122 Type 2 diabetes mellitus with diabetic chronic kidney disease: Secondary | ICD-10-CM | POA: Diagnosis not present

## 2017-12-04 DIAGNOSIS — N184 Chronic kidney disease, stage 4 (severe): Secondary | ICD-10-CM | POA: Insufficient documentation

## 2017-12-04 DIAGNOSIS — R109 Unspecified abdominal pain: Secondary | ICD-10-CM | POA: Diagnosis present

## 2017-12-04 DIAGNOSIS — R1013 Epigastric pain: Secondary | ICD-10-CM

## 2017-12-04 LAB — COMPREHENSIVE METABOLIC PANEL
ALBUMIN: 3.3 g/dL — AB (ref 3.5–5.0)
ALK PHOS: 102 U/L (ref 38–126)
ALT: 14 U/L (ref 14–54)
ANION GAP: 9 (ref 5–15)
AST: 23 U/L (ref 15–41)
BILIRUBIN TOTAL: 1 mg/dL (ref 0.3–1.2)
BUN: 19 mg/dL (ref 6–20)
CALCIUM: 8.8 mg/dL — AB (ref 8.9–10.3)
CO2: 28 mmol/L (ref 22–32)
Chloride: 102 mmol/L (ref 101–111)
Creatinine, Ser: 1.14 mg/dL — ABNORMAL HIGH (ref 0.44–1.00)
GFR calc Af Amer: 46 mL/min — ABNORMAL LOW (ref >60)
GFR calc non Af Amer: 39 mL/min — ABNORMAL LOW (ref >60)
GLUCOSE: 117 mg/dL — AB (ref 65–99)
Potassium: 3.6 mmol/L (ref 3.5–5.1)
Sodium: 139 mmol/L (ref 135–145)
TOTAL PROTEIN: 6.1 g/dL — AB (ref 6.5–8.1)

## 2017-12-04 LAB — CBC WITH DIFFERENTIAL/PLATELET
BASOS PCT: 0 %
Basophils Absolute: 0 10*3/uL (ref 0.0–0.1)
EOS ABS: 0.1 10*3/uL (ref 0.0–0.7)
EOS PCT: 1 %
HCT: 33.7 % — ABNORMAL LOW (ref 36.0–46.0)
Hemoglobin: 11 g/dL — ABNORMAL LOW (ref 12.0–15.0)
Lymphocytes Relative: 26 %
Lymphs Abs: 1.9 10*3/uL (ref 0.7–4.0)
MCH: 30.1 pg (ref 26.0–34.0)
MCHC: 32.6 g/dL (ref 30.0–36.0)
MCV: 92.1 fL (ref 78.0–100.0)
MONO ABS: 0.5 10*3/uL (ref 0.1–1.0)
MONOS PCT: 6 %
NEUTROS PCT: 67 %
Neutro Abs: 4.9 10*3/uL (ref 1.7–7.7)
PLATELETS: 255 10*3/uL (ref 150–400)
RBC: 3.66 MIL/uL — ABNORMAL LOW (ref 3.87–5.11)
RDW: 13.5 % (ref 11.5–15.5)
WBC: 7.4 10*3/uL (ref 4.0–10.5)

## 2017-12-04 LAB — I-STAT CG4 LACTIC ACID, ED: Lactic Acid, Venous: 0.78 mmol/L (ref 0.5–1.9)

## 2017-12-04 LAB — I-STAT TROPONIN, ED: TROPONIN I, POC: 0 ng/mL (ref 0.00–0.08)

## 2017-12-04 LAB — LIPASE, BLOOD: Lipase: 106 U/L — ABNORMAL HIGH (ref 11–51)

## 2017-12-04 MED ORDER — IOPAMIDOL (ISOVUE-300) INJECTION 61%
30.0000 mL | Freq: Once | INTRAVENOUS | Status: AC | PRN
  Administered 2017-12-04: 30 mL via ORAL

## 2017-12-04 MED ORDER — FENTANYL CITRATE (PF) 100 MCG/2ML IJ SOLN
100.0000 ug | Freq: Once | INTRAMUSCULAR | Status: AC
  Administered 2017-12-04: 100 ug via INTRAVENOUS
  Filled 2017-12-04: qty 2

## 2017-12-04 MED ORDER — RANITIDINE HCL 150 MG PO CAPS: 150.0000 mg | ORAL_CAPSULE | Freq: Every day | ORAL | 0 refills | Status: DC

## 2017-12-04 MED ORDER — SODIUM CHLORIDE 0.9 % IV SOLN
80.0000 mg | Freq: Once | INTRAVENOUS | Status: AC
  Administered 2017-12-04: 80 mg via INTRAVENOUS
  Filled 2017-12-04: qty 80

## 2017-12-04 MED ORDER — IOPAMIDOL (ISOVUE-300) INJECTION 61%
INTRAVENOUS | Status: AC
  Administered 2017-12-04: 100 mL via INTRAVENOUS
  Filled 2017-12-04: qty 100

## 2017-12-04 MED ORDER — IOPAMIDOL (ISOVUE-300) INJECTION 61%
INTRAVENOUS | Status: AC
  Administered 2017-12-04: 30 mL via ORAL
  Filled 2017-12-04: qty 30

## 2017-12-04 MED ORDER — IOPAMIDOL (ISOVUE-300) INJECTION 61%
100.0000 mL | Freq: Once | INTRAVENOUS | Status: AC | PRN
  Administered 2017-12-04: 100 mL via INTRAVENOUS

## 2017-12-04 NOTE — ED Notes (Signed)
Pt called multiple neighbors and friends without response. Unsafe to send pt by cab related to uses walker and does not have with her. Pt needs assistance inside. Pt agrees to ambulance home. [facility] called.

## 2017-12-04 NOTE — Telephone Encounter (Signed)
See note

## 2017-12-04 NOTE — ED Notes (Signed)
Brief changed. Pt in gown. Pt given warm blanket. Pauline Good secretary cancelled [facility] related to pt able to get a hold of neighbor. Pt assisted via wheelchair to car.

## 2017-12-04 NOTE — ED Triage Notes (Signed)
Pt arriving from home with complaint of lower abdominal pain, N/V x 5 days. Pt recently seen for same.

## 2017-12-04 NOTE — Telephone Encounter (Signed)
Copied from [place] [id number]. Topic: Quick Communication - See Telephone Encounter >> Dec 04, 2017  4:28 PM Cleaster Corin, NT wrote: CRM for notification. See Telephone encounter for:   12/04/17.  Pt. Needs a referral to Gi Doctor from er.visit request Pt can be reached at [phone]

## 2017-12-04 NOTE — Discharge Instructions (Signed)
Take Maalox 2 tablespoons after meals and at bedtime.  Call Dr. Juleen China to arrange to be seen in the office if pain is not well controlled.  You may need a referral to a gastroenterologist

## 2017-12-04 NOTE — ED Notes (Signed)
Unsuccessful IV attempt to right hand by this Probation officer. Was told in report two unsuccessful IV attempts prior. Quick RN verbalizes will attempt IV start.

## 2017-12-04 NOTE — Telephone Encounter (Signed)
Copied from [place] [id number]. Topic: Referral - Request >> Dec 04, 2017  9:31 AM Vernona Rieger wrote: Shona Needles From [facility] called and said the pt was not taking her meds that was on her DC summary from the hospital. Pt is taking meds she was not suppose to take, not eating good, not drinking enough fluid. Has fell twice since hospital. She is refusing to do anything for her. She has a care giver but only stays twice a week. She is asking for a hospice referral.  Call back is [phone]. The office number is [phone], fax is [phone]

## 2017-12-04 NOTE — Telephone Encounter (Signed)
Spoke with [name] and [name] the Power of [place] for [name]. They have stepped in due to her son has abandoned her. They are not very aware of all of her medications that she takes. I have scheduled her an appointment for visit with [name] next week. I also called [name] the nurse supervisor at [facility]. The nurse has not been out yet to evaluate [name]. She could not tell me when the nurse could go out. By the end of the conversation she stated that she could schedule a nurse to go out over the weekend. I told her that would be wonderful and I would call her back on Monday to check on the progress. The office number to [facility] is [phone].

## 2017-12-04 NOTE — Telephone Encounter (Signed)
See other phone message from today.

## 2017-12-04 NOTE — ED Notes (Signed)
Bed: WA17 Expected date:  Expected time:  Means of arrival:  Comments: 83 yr old abdominal pain

## 2017-12-04 NOTE — Telephone Encounter (Signed)
Spoke with Home Health PT and they stated that Ms. Dinkel agreed to have a Hospice Evaluation. I tried to call the patient to make sure that she was okay with this. I was not able to reach the patient. The physical therapist gave me some names of people to speak, but unfortunately we do not have any of the names on file. I still had her attorneys card where she wanted [place] to fill out a form so I called and spoke with the attorney himself. He has faxed over the patient healthcare and general power of attorney so we can contact someone else. I will call and speak with her power of attorney.

## 2017-12-04 NOTE — Telephone Encounter (Signed)
Copied from [place] [phone]. Topic: Inquiry >> Dec 04, 2017 10:23 AM Malena Catholic I, NT wrote: Reason for CRM: Physical therapy call and said pt  is Hospital on 12/04/17 for more inf call Judeen Hammans [phone]

## 2017-12-04 NOTE — ED Provider Notes (Signed)
[facility] DEPT Provider Note   CSN: [id number] Arrival date & time: 12/04/17  4098     History   Chief Complaint Chief Complaint  Patient presents with  . Abdominal Pain    HPI [name] is a 83 y.o. female.  HPI Planes of abdominal pain in epigastric area for 1 month becoming worse last night.  She vomited one time last night, clear material.  Her last bowel movement was yesterday, normal she denies any fever.  Denies nausea at present.  Nothing makes pain better or worse.  She had multiple visits to the ED for similar pain within the past month she was discharged from here 11/24/2017 for UTI had subsequent visit 11/26/2017 is complaining of diarrhea.  She denies any urinary symptoms.  Denies fever.  Denies chest pain.  No treatment prior to coming here.  Nothing makes symptoms better or worse. Past Medical History:  Diagnosis Date  . Acute gastritis without mention of hemorrhage   . Anemia   . Anxiety   . Arthritis    "LLL; palm of right hand" (06/12/2015)  . Atrial fibrillation ([place])   . CAP (community acquired pneumonia) 04/03/2014  . Crohn disease ([place])   . Cyst and pseudocyst of pancreas   . Depression   . Diaphragmatic hernia without mention of obstruction or gangrene   . Diverticulosis of colon (without mention of hemorrhage)   . Duodenitis without mention of hemorrhage   . DVT (deep venous thrombosis) ([place])    "twice on the left leg; once on the right leg" (06/12/2015)  . Dysrhythmia   . Esophageal reflux   . Hiatal hernia- moderate to large 10/06/2014  . Hx pulmonary embolism   . Hypothyroidism   . Insomnia, unspecified   . Left leg DVT ([place]) jan 2013  . Migraine    hx  . OAB (overactive bladder)   . Other and unspecified hyperlipidemia   . Other malaise and fatigue   . Pancreas divisum 03/02/2015  . Pancreatic cyst   . Personal history of unspecified digestive disease   . Polymyalgia rheumatica ([place])   .  Protein-calorie malnutrition, severe ([place]) 10/02/2014  . Restless legs syndrome (RLS)   . Shingles   . Stricture and stenosis of esophagus   . Type II diabetes mellitus ([place])   . Unspecified essential hypertension   . Unspecified sleep apnea    hx of sleep apnea, none since weight loss (06/12/2015)  . Urinary frequency   . Urinary urgency     Patient Active Problem List   Diagnosis Date Noted  . CKD (chronic kidney disease), stage III ([place]) 11/22/2017  . Hypertension 11/22/2017  . DVT (deep vein thrombosis) in pregnancy ([place]) 11/22/2017  . Atrial fibrillation, chronic ([place]) 11/22/2017  . Depression 11/22/2017  . Protein-calorie malnutrition, moderate ([place]) 11/22/2017  . Dry skin dermatitis 09/24/2017  . Osteoarthritis 07/31/2017  . CKD stage 4 due to type 2 diabetes mellitus ([place]) 04/19/2017  . Iron deficiency anemia 04/19/2017  . B12 deficiency 04/08/2017  . Chronic constipation 04/08/2017  . Chronic deep vein thrombosis (DVT) of left lower extremity ([place]) 04/08/2017  . Episode of recurrent major depressive disorder ([place]) 04/08/2017  . Monoclonal gammopathy of unknown significance (MGUS) 04/08/2017  . Peripheral neuropathy 04/08/2017  . Rectal mass   . Abdominal pain 06/12/2015  . UTI (urinary tract infection) 03/02/2015  . Pancreas divisum 03/02/2015  . Physical deconditioning 03/01/2015  . Nausea vomiting and diarrhea   . Pancreatic cyst   .  Hiatal hernia- moderate to large 10/06/2014  . Heme + stool 10/04/2014  . Crohn's ileocolitis ([place]) 10/04/2014  . 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 Arthritis   . Hx pulmonary embolism   . Hypothyroidism 04/11/2014  . Type II diabetes mellitus with renal manifestations ([place]) 04/11/2014  . Anemia of chronic disease 04/03/2014  . OAB (overactive bladder) 01/07/2012  . Restless leg syndrome 03/09/2008  . Benign essential hypertension 03/09/2008  . ESOPHAGEAL STRICTURE 03/09/2008  . GERD (gastroesophageal reflux disease) 03/09/2008  .  Diverticulosis of large intestine 03/09/2008  . Polymyalgia rheumatica ([place]) 03/09/2008    Past Surgical History:  Procedure Laterality Date  . BACK SURGERY    . CATARACT EXTRACTION, BILATERAL Bilateral   . CLOSED REDUCTION NASAL FRACTURE  08/2008   Archie Endo 04/10/2011  . COLON SURGERY  June 17, 2012   surgery for bleed  . COLONOSCOPY N/A 06/13/2015   Procedure: COLONOSCOPY;  Surgeon: Irene Shipper, MD;  Location: [facility];  Service: Endoscopy;  Laterality: N/A;  . OOPHORECTOMY  1944   not sure which ovary  . POSTERIOR LUMBAR FUSION  01/2009   L4-L5 Archie Endo 04/09/2011  . REDUCTION MAMMAPLASTY Bilateral 1980  . ROTATOR CUFF REPAIR Right   . SHOULDER OPEN ROTATOR CUFF REPAIR  08/04/2012   Procedure: ROTATOR CUFF REPAIR SHOULDER OPEN;  Surgeon: Tobi Bastos, MD;  Location: [facility];  Service: Orthopedics;  Laterality: Left;  with Anchors and Graft  . SHOULDER OPEN ROTATOR CUFF REPAIR Right 2006   Archie Endo 04/22/2011  . TONSILLECTOMY      OB History    No data available       Home Medications    Prior to Admission medications   Medication Sig Start Date End Date Taking? Authorizing Provider  apixaban (ELIQUIS) 2.5 MG TABS tablet Take 1 tablet (2.5 mg total) by mouth 2 (two) times daily. 10/09/14   Regalado, Belkys A, MD  Cranberry 250 MG CAPS Take 1 capsule (250 mg total) by mouth daily. Patient not taking: Reported on 11/26/2017 11/24/17 12/24/17  Florencia Reasons, MD  furosemide (LASIX) 20 MG tablet Take 1 tablet (20 mg total) by mouth every other day as needed for fluid or edema. 11/24/17   Florencia Reasons, MD  nitrofurantoin, macrocrystal-monohydrate, (MACROBID) 100 MG capsule Take 100 mg by mouth 2 (two) times daily. 11/03/17   [provider]  ondansetron (ZOFRAN) 4 MG tablet Take 1 tablet (4 mg total) by mouth every 8 (eight) hours as needed for nausea or vomiting. 11/01/17   Mesner, Corene Cornea, MD  pantoprazole (PROTONIX) 40 MG tablet Take 40 mg by mouth daily.    [provider]    polyvinyl alcohol (LIQUIFILM TEARS) 1.4 % ophthalmic solution Place 1 drop into both eyes as needed for dry eyes. 11/24/17   Florencia Reasons, MD  ropinirole (REQUIP) 5 MG tablet Take 5 mg by mouth at bedtime.    [provider]  sitaGLIPtin (JANUVIA) 25 MG tablet Take 25 mg daily by mouth.    [provider]  triamcinolone ointment (KENALOG) 0.5 % Apply 1 application topically 2 (two) times daily. Patient taking differently: Apply 1 application 2 (two) times daily as needed topically (irritation,rash).  09/21/17   Briscoe Deutscher, DO    Family History Family History  Problem Relation Age of Onset  . Melanoma Daughter        died age 57  . Melanoma Son     Social History Social History   Tobacco Use  . Smoking status: Never  Smoker  . Smokeless tobacco: Never Used  Substance Use Topics  . Alcohol use: No  . Drug use: No     Allergies   Sulfa antibiotics and Penicillins   Review of Systems Review of Systems  Constitutional: Negative.   HENT: Negative.   Respiratory: Negative.   Cardiovascular: Negative.   Gastrointestinal: Positive for abdominal pain and vomiting.  Musculoskeletal: Negative.   Skin: Negative.   Neurological: Negative.   Psychiatric/Behavioral: Negative.   All other systems reviewed and are negative.    Physical Exam Updated Vital Signs BP (!) 148/68 (BP Location: Left Arm)   Pulse (!) 59   Temp (!) 97.5 F (36.4 C) (Oral)   Resp 16   SpO2 97%   Physical Exam  Constitutional: She appears well-developed and well-nourished.  Frail-appearing, appears uncomfortable  HENT:  Head: Normocephalic and atraumatic.  Eyes: Conjunctivae are normal. Pupils are equal, round, and reactive to light.  Neck: Neck supple. No tracheal deviation present. No thyromegaly present.  Cardiovascular: Normal rate and regular rhythm.  No murmur heard. Pulmonary/Chest: Effort normal and breath sounds normal.  Abdominal: Soft. Bowel sounds are normal. She  exhibits no distension. There is tenderness.  Tender at epigastrium  Musculoskeletal: Normal range of motion. She exhibits no edema or tenderness.  Neurological: She is alert. Coordination normal.  Skin: Skin is warm and dry. No rash noted.  Psychiatric: She has a normal mood and affect.  Nursing note and vitals reviewed.    ED Treatments / Results  Labs (all labs ordered are listed, but only abnormal results are displayed) Labs Reviewed  COMPREHENSIVE METABOLIC PANEL  LIPASE, BLOOD  CBC WITH DIFFERENTIAL/PLATELET  I-STAT TROPONIN, ED  I-STAT CG4 LACTIC ACID, ED    EKG  EKG Interpretation  Date/Time:  Friday December 04 2017 07:40:53 EST Ventricular Rate:  78 PR Interval:    QRS Duration: 133 QT Interval:  441 QTC Calculation: 503 R Axis:   -62 Text Interpretation:  Sinus rhythm Atrial premature complexes RBBB and LAFB Minimal ST elevation, lateral leads No significant change since last tracing Confirmed by Orlie Dakin [phone]) on 12/04/2017 7:44:58 AM       Radiology No results found.  Procedures Procedures (including critical care time)  Medications Ordered in ED Medications  pantoprazole (PROTONIX) 80 mg in sodium chloride 0.9 % 100 mL IVPB (not administered)  fentaNYL (SUBLIMAZE) injection 100 mcg (not administered)   Results for orders placed or performed during the hospital encounter of 12/04/17  Comprehensive metabolic panel  Result Value Ref Range   Sodium 139 135 - 145 mmol/L   Potassium 3.6 3.5 - 5.1 mmol/L   Chloride 102 101 - 111 mmol/L   CO2 28 22 - 32 mmol/L   Glucose, Bld 117 (H) 65 - 99 mg/dL   BUN 19 6 - 20 mg/dL   Creatinine, Ser 1.14 (H) 0.44 - 1.00 mg/dL   Calcium 8.8 (L) 8.9 - 10.3 mg/dL   Total Protein 6.1 (L) 6.5 - 8.1 g/dL   Albumin 3.3 (L) 3.5 - 5.0 g/dL   AST 23 15 - 41 U/L   ALT 14 14 - 54 U/L   Alkaline Phosphatase 102 38 - 126 U/L   Total Bilirubin 1.0 0.3 - 1.2 mg/dL   GFR calc non Af Amer 39 (L) >60 mL/min   GFR calc  Af Amer 46 (L) >60 mL/min   Anion gap 9 5 - 15  Lipase, blood  Result Value Ref Range   Lipase 106 (H)  11 - 51 U/L  CBC with Differential  Result Value Ref Range   WBC 7.4 4.0 - 10.5 K/uL   RBC 3.66 (L) 3.87 - 5.11 MIL/uL   Hemoglobin 11.0 (L) 12.0 - 15.0 g/dL   HCT 33.7 (L) 36.0 - 46.0 %   MCV 92.1 78.0 - 100.0 fL   MCH 30.1 26.0 - 34.0 pg   MCHC 32.6 30.0 - 36.0 g/dL   RDW 13.5 11.5 - 15.5 %   Platelets 255 150 - 400 K/uL   Neutrophils Relative % 67 %   Neutro Abs 4.9 1.7 - 7.7 K/uL   Lymphocytes Relative 26 %   Lymphs Abs 1.9 0.7 - 4.0 K/uL   Monocytes Relative 6 %   Monocytes Absolute 0.5 0.1 - 1.0 K/uL   Eosinophils Relative 1 %   Eosinophils Absolute 0.1 0.0 - 0.7 K/uL   Basophils Relative 0 %   Basophils Absolute 0.0 0.0 - 0.1 K/uL  I-stat troponin, ED  Result Value Ref Range   Troponin i, poc 0.00 0.00 - 0.08 ng/mL   Comment 3          I-Stat CG4 Lactic Acid, ED  Result Value Ref Range   Lactic Acid, Venous 0.78 0.5 - 1.9 mmol/L   Ct Abdomen Pelvis Wo Contrast  Result Date: 11/22/2017 CLINICAL DATA:  Nausea and vomiting for 2 days, initial encounter EXAM: CT ABDOMEN AND PELVIS WITHOUT CONTRAST TECHNIQUE: Multidetector CT imaging of the abdomen and pelvis was performed following the standard protocol without IV contrast. COMPARISON:  10/24/2017 FINDINGS: Lower chest: Lung bases are free of acute infiltrate or sizable effusion. A large hiatal hernia is noted. Hepatobiliary: Small hypodensity is again noted within the liver stable from the previous exam. No gallstones, gallbladder wall thickening, or biliary dilatation. Pancreas: Mildly fatty infiltrated. The previously seen cystic lesion is again present but less prominent due to the lack of IV contrast. Spleen: Normal in size without focal abnormality. Adrenals/Urinary Tract: Adrenal glands are within normal limits bilaterally. Kidneys are well visualized without evidence of renal calculi or obstructive changes. The  bladder is well distended. Stomach/Bowel: Scattered diverticular change is noted within the sigmoid colon without evidence of diverticulitis. No obstructive or inflammatory changes are seen. Fluid is noted within the right colon consistent with the given clinical history. The appendix is not visualized although no inflammatory changes to suggest appendicitis are seen. A few loops of minimally prominent jejunum are noted without true obstructive change. Vascular/Lymphatic: Aortic atherosclerosis. No enlarged abdominal or pelvic lymph nodes. Reproductive: Uterus and bilateral adnexa are unremarkable. Other: No abdominal wall hernia or abnormality. No abdominopelvic ascites. Musculoskeletal: Postsurgical changes in the lumbar spine are noted. Stable anterolisthesis of L3 on L4 is noted. No acute bony abnormality is seen. IMPRESSION: Diverticulosis without diverticulitis. Fluid within the right colon. Few loops of minimally prominent proximal jejunum are noted without definitive obstructive change. A mild enteritis could not be totally excluded. Chronic changes stable from the previous exam. Electronically Signed   By: Inez Catalina M.D.   On: 11/22/2017 07:09   Ct Abdomen Pelvis W Contrast  Result Date: 12/04/2017 CLINICAL DATA:  Nausea and vomiting and abdominal pain for several days EXAM: CT ABDOMEN AND PELVIS WITH CONTRAST TECHNIQUE: Multidetector CT imaging of the abdomen and pelvis was performed using the standard protocol following bolus administration of intravenous contrast. CONTRAST:  80 mL Isovue 300 COMPARISON:  11/22/2017 FINDINGS: Lower chest: Bibasilar scarring is noted. No acute infiltrate is noted. Large hiatal hernia  is again noted. Hepatobiliary: Stable hypodensities are noted within the liver consistent with small cysts. The gallbladder is within normal limits. Pancreas: Stable cystic lesion is noted in the midportion of the pancreas. No inflammatory changes are noted. Spleen: Normal in size  without focal abnormality. Adrenals/Urinary Tract: Adrenal glands are unremarkable. Kidneys are normal, without renal calculi, focal lesion, or hydronephrosis. Bladder is well distended. Stomach/Bowel: Diverticular change of the colon is noted without evidence of diverticulitis. No obstructive changes are seen. The appendix is again not well visualized. No inflammatory changes to suggest appendicitis are seen. No obstructive or inflammatory changes are noted. Vascular/Lymphatic: Aortic atherosclerosis. No enlarged abdominal or pelvic lymph nodes. Reproductive: Uterus and bilateral adnexa are unremarkable. Other: No abdominal wall hernia or abnormality. No abdominopelvic ascites. Musculoskeletal: Postsurgical changes are again seen and stable. Degenerative changes of the lumbar spine are noted. IMPRESSION: Diverticulosis without diverticulitis. Stable pancreatic cystic lesion. Chronic changes as described above without acute abnormality. Electronically Signed   By: Inez Catalina M.D.   On: 12/04/2017 11:23    Initial Impression / Assessment and Plan / ED Course  I have reviewed the triage vital signs and the nursing notes.  Pertinent labs & imaging results that were available during my care of the patient were reviewed by me and considered in my medical decision making (see chart for details).     8:30 AM pain much improved after treatment with intravenous fentanyl and Protonix 11:30 AM patient resting comfortably.  Asymptomatic.  Plan antacids in addition to usual medicines suggest Maalox.  She is no longer nauseated able to drink without difficulty suspect gastritis.  She will not get prescription for Zantac as she is already on a PPI Final Clinical Impressions(s) / ED Diagnoses  Diagnosis epigastric abdominal pain Final diagnoses:  None    ED Discharge Orders    None       Orlie Dakin, MD 12/04/17 1137

## 2017-12-04 NOTE — Telephone Encounter (Signed)
Dr Juleen China, please review and advise.

## 2017-12-05 NOTE — Telephone Encounter (Signed)
I need to see this patient. She has been to the ER multiple times.   I received a request to refer her to hospice on the same day that I received a request to refer her to GI.  This needs to be a 40 minute appointment.

## 2017-12-07 NOTE — Telephone Encounter (Signed)
Patient is already scheduled to be seen on 12/10/16.

## 2017-12-07 NOTE — Telephone Encounter (Signed)
Patient was seen on Sunday by [place]. We will await report.

## 2017-12-09 ENCOUNTER — Telehealth: Payer: Self-pay | Admitting: Family Medicine

## 2017-12-09 NOTE — Telephone Encounter (Signed)
Copied from [place] [id number]. Topic: Inquiry >> Dec 07, 2017  1:52 PM Conception Chancy, NT wrote: Reason for CRM: Anne Fu is calling from [facility] to get verbal orders for physical therapy 1 week 1 and then 2 week 5.   Anne Fu [phone]

## 2017-12-09 NOTE — Telephone Encounter (Signed)
Left message on nurse voice mail for verbal orders. Left message for her to return if anything else needed.

## 2017-12-10 ENCOUNTER — Telehealth: Payer: Self-pay | Admitting: Family Medicine

## 2017-12-10 ENCOUNTER — Encounter: Payer: Self-pay | Admitting: Family Medicine

## 2017-12-10 ENCOUNTER — Ambulatory Visit: Payer: Medicare Other | Admitting: Family Medicine

## 2017-12-10 VITALS — BP 130/64 | HR 83 | Ht 61.0 in | Wt 119.6 lb

## 2017-12-10 DIAGNOSIS — R5381 Other malaise: Secondary | ICD-10-CM | POA: Diagnosis not present

## 2017-12-10 DIAGNOSIS — I482 Chronic atrial fibrillation, unspecified: Secondary | ICD-10-CM

## 2017-12-10 DIAGNOSIS — R1010 Upper abdominal pain, unspecified: Secondary | ICD-10-CM | POA: Diagnosis not present

## 2017-12-10 DIAGNOSIS — E039 Hypothyroidism, unspecified: Secondary | ICD-10-CM | POA: Diagnosis not present

## 2017-12-10 DIAGNOSIS — L853 Xerosis cutis: Secondary | ICD-10-CM

## 2017-12-10 DIAGNOSIS — R109 Unspecified abdominal pain: Secondary | ICD-10-CM | POA: Diagnosis not present

## 2017-12-10 DIAGNOSIS — I82502 Chronic embolism and thrombosis of unspecified deep veins of left lower extremity: Secondary | ICD-10-CM

## 2017-12-10 DIAGNOSIS — Z66 Do not resuscitate: Secondary | ICD-10-CM | POA: Diagnosis not present

## 2017-12-10 DIAGNOSIS — E1122 Type 2 diabetes mellitus with diabetic chronic kidney disease: Secondary | ICD-10-CM

## 2017-12-10 DIAGNOSIS — N183 Chronic kidney disease, stage 3 unspecified: Secondary | ICD-10-CM

## 2017-12-10 LAB — COMPREHENSIVE METABOLIC PANEL
ALT: 11 U/L (ref 0–35)
AST: 18 U/L (ref 0–37)
Albumin: 3.5 g/dL (ref 3.5–5.2)
Alkaline Phosphatase: 109 U/L (ref 39–117)
BUN: 17 mg/dL (ref 6–23)
CO2: 28 mEq/L (ref 19–32)
Calcium: 8.9 mg/dL (ref 8.4–10.5)
Chloride: 106 mEq/L (ref 96–112)
Creatinine, Ser: 1.03 mg/dL (ref 0.40–1.20)
GFR: 52.77 mL/min — ABNORMAL LOW (ref >60.00)
Glucose, Bld: 149 mg/dL — ABNORMAL HIGH (ref 70–99)
Potassium: 4.4 mEq/L (ref 3.5–5.1)
Sodium: 142 mEq/L (ref 135–145)
Total Bilirubin: 0.4 mg/dL (ref 0.2–1.2)
Total Protein: 5.8 g/dL — ABNORMAL LOW (ref 6.0–8.3)

## 2017-12-10 LAB — URINALYSIS, ROUTINE W REFLEX MICROSCOPIC
Bilirubin Urine: NEGATIVE
Ketones, ur: NEGATIVE
Nitrite: NEGATIVE
Specific Gravity, Urine: 1.015 (ref 1.000–1.030)
Total Protein, Urine: NEGATIVE
Urine Glucose: NEGATIVE
Urobilinogen, UA: 0.2 (ref 0.0–1.0)
pH: 6 (ref 5.0–8.0)

## 2017-12-10 LAB — CBC
HCT: 35.2 % — ABNORMAL LOW (ref 36.0–46.0)
Hemoglobin: 11.3 g/dL — ABNORMAL LOW (ref 12.0–15.0)
MCHC: 32.2 g/dL (ref 30.0–36.0)
MCV: 94.1 fl (ref 78.0–100.0)
Platelets: 259 10*3/uL (ref 150.0–400.0)
RBC: 3.74 Mil/uL — ABNORMAL LOW (ref 3.87–5.11)
RDW: 14 % (ref 11.5–15.5)
WBC: 5.1 10*3/uL (ref 4.0–10.5)

## 2017-12-10 LAB — TSH: TSH: 10.35 u[IU]/mL — ABNORMAL HIGH (ref 0.35–4.50)

## 2017-12-10 MED ORDER — PANTOPRAZOLE SODIUM 40 MG PO TBEC: 40.0000 mg | DELAYED_RELEASE_TABLET | Freq: Every day | ORAL | 0 refills | Status: DC

## 2017-12-10 NOTE — Progress Notes (Signed)
[name] is a 84 y.o. female is here for follow up.  History of Present Illness:   HPI: See Assessment and Plan section for Problem Based Charting of issues discussed today.  Health Maintenance Due  Topic Date Due  . OPHTHALMOLOGY EXAM  07/24/1931  . TETANUS/TDAP  07/23/1940  . DEXA SCAN  07/23/1986  . PNA vac Low Risk Adult (1 of 2 - PCV13) 07/23/1986   Depression screen PHQ 2/9 11/03/2017  Decreased Interest 0  Down, Depressed, Hopeless 0  PHQ - 2 Score 0   PMHx, SurgHx, SocialHx, FamHx, Medications, and Allergies were reviewed in the Visit Navigator and updated as appropriate.   Patient Active Problem List   Diagnosis Date Noted  . CKD (chronic kidney disease), stage III ([place]) 11/22/2017  . Hypertension 11/22/2017  . DVT (deep vein thrombosis) in pregnancy ([place]) 11/22/2017  . Atrial fibrillation, chronic ([place]) 11/22/2017  . Depression 11/22/2017  . Protein-calorie malnutrition, moderate ([place]) 11/22/2017  . Dry skin dermatitis 09/24/2017  . Osteoarthritis 07/31/2017  . CKD stage 4 due to type 2 diabetes mellitus ([place]) 04/19/2017  . Iron deficiency anemia 04/19/2017  . B12 deficiency 04/08/2017  . Chronic constipation 04/08/2017  . Chronic deep vein thrombosis (DVT) of left lower extremity ([place]) 04/08/2017  . Episode of recurrent major depressive disorder ([place]) 04/08/2017  . Monoclonal gammopathy of unknown significance (MGUS) 04/08/2017  . Peripheral neuropathy 04/08/2017  . Rectal mass   . Abdominal pain 06/12/2015  . UTI (urinary tract infection) 03/02/2015  . Pancreas divisum 03/02/2015  . Physical deconditioning 03/01/2015  . Nausea vomiting and diarrhea   . Pancreatic cyst   . Hiatal hernia- moderate to large 10/06/2014  . Heme + stool 10/04/2014  . Crohn's ileocolitis ([place]) 10/04/2014  . 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 Arthritis   . Hx pulmonary embolism   . Hypothyroidism 04/11/2014  . Type II diabetes mellitus with renal manifestations ([place])  04/11/2014  . Anemia of chronic disease 04/03/2014  . OAB (overactive bladder) 01/07/2012  . Restless leg syndrome 03/09/2008  . Benign essential hypertension 03/09/2008  . ESOPHAGEAL STRICTURE 03/09/2008  . GERD (gastroesophageal reflux disease) 03/09/2008  . Diverticulosis of large intestine 03/09/2008  . Polymyalgia rheumatica ([place]) 03/09/2008   Social History   Tobacco Use  . Smoking status: Never Smoker  . Smokeless tobacco: Never Used  Substance Use Topics  . Alcohol use: No  . Drug use: No   Current Medications and Allergies:   .  apixaban (ELIQUIS) 2.5 MG TABS tablet, Take 1 tablet (2.5 mg total) by mouth 2 (two) times daily., Disp: 60 tablet, Rfl: 0 .  Cranberry 250 MG CAPS, Take 1 capsule (250 mg total) by mouth daily. (Patient not taking: Reported on 11/26/2017), Disp: 30 capsule, Rfl: 0 .  furosemide (LASIX) 20 MG tablet, Take 1 tablet (20 mg total) by mouth every other day as needed for fluid or edema., Disp: 30 tablet, Rfl: 0 .  nitrofurantoin, macrocrystal-monohydrate, (MACROBID) 100 MG capsule, Take 100 mg by mouth 2 (two) times daily., Disp: , Rfl: 0 .  ondansetron (ZOFRAN) 4 MG tablet, Take 1 tablet (4 mg total) by mouth every 8 (eight) hours as needed for nausea or vomiting., Disp: 12 tablet, Rfl: 0 .  pantoprazole (PROTONIX) 40 MG tablet, Take 40 mg by mouth daily., Disp: , Rfl:  .  polyvinyl alcohol (LIQUIFILM TEARS) 1.4 % ophthalmic solution, Place 1 drop into both eyes as needed for dry eyes., Disp: 15 mL, Rfl: 0 .  ranitidine (ZANTAC) 150 MG capsule, Take 1 capsule (150 mg total) by mouth daily., Disp: 30 capsule, Rfl: 0 .  ropinirole (REQUIP) 5 MG tablet, Take 5 mg by mouth at bedtime., Disp: , Rfl:  .  triamcinolone ointment (KENALOG) 0.5 %, Apply 1 application topically 2 (two) times daily. (Patient taking differently: Apply 1 application 2 (two) times daily as needed topically (irritation,rash). ), Disp: 30 g, Rfl: 0  Allergies  Allergen Reactions  .  Sulfa Antibiotics Nausea And Vomiting  . Penicillins Hives and Rash    ++tolerates Ceftriaxone and keflex++ Has patient had a PCN reaction causing immediate rash, facial/tongue/throat swelling, SOB or lightheadedness with hypotension: Unknown Has patient had a PCN reaction causing severe rash involving mucus membranes or skin necrosis: Unknown Has patient had a PCN reaction that required hospitalization Unknown Has patient had a PCN reaction occurring within the last 10 years: No If all of the above answers are "NO", then may proceed with Cephalosporin use.    Review of Systems   Pertinent items are noted in the HPI. Otherwise, ROS is negative.  Vitals:   Vitals:   12/10/17 0930  BP: 130/64  Pulse: 83  SpO2: 99%  Weight: 119 lb 9.6 oz (54.3 kg)  Height: 5' 1"  (1.549 m)     Body mass index is 22.6 kg/m.  Physical Exam:   Physical Exam  Constitutional: She appears well-nourished.  HENT:  Head: Normocephalic and atraumatic.  Eyes: EOM are normal. Pupils are equal, round, and reactive to light.  Neck: Normal range of motion. Neck supple.  Cardiovascular: Normal rate, normal heart sounds and intact distal pulses. An irregular rhythm present.  Pulmonary/Chest: Effort normal.  Abdominal: Soft.  Skin: Skin is warm.  Psychiatric: She has a normal mood and affect. Her behavior is normal.  Nursing note and vitals reviewed.   Results for orders placed or performed during the hospital encounter of 12/04/17  Comprehensive metabolic panel  Result Value Ref Range   Sodium 139 135 - 145 mmol/L   Potassium 3.6 3.5 - 5.1 mmol/L   Chloride 102 101 - 111 mmol/L   CO2 28 22 - 32 mmol/L   Glucose, Bld 117 (H) 65 - 99 mg/dL   BUN 19 6 - 20 mg/dL   Creatinine, Ser 1.14 (H) 0.44 - 1.00 mg/dL   Calcium 8.8 (L) 8.9 - 10.3 mg/dL   Total Protein 6.1 (L) 6.5 - 8.1 g/dL   Albumin 3.3 (L) 3.5 - 5.0 g/dL   AST 23 15 - 41 U/L   ALT 14 14 - 54 U/L   Alkaline Phosphatase 102 38 - 126 U/L   Total  Bilirubin 1.0 0.3 - 1.2 mg/dL   GFR calc non Af Amer 39 (L) >60 mL/min   GFR calc Af Amer 46 (L) >60 mL/min   Anion gap 9 5 - 15  Lipase, blood  Result Value Ref Range   Lipase 106 (H) 11 - 51 U/L  CBC with Differential  Result Value Ref Range   WBC 7.4 4.0 - 10.5 K/uL   RBC 3.66 (L) 3.87 - 5.11 MIL/uL   Hemoglobin 11.0 (L) 12.0 - 15.0 g/dL   HCT 33.7 (L) 36.0 - 46.0 %   MCV 92.1 78.0 - 100.0 fL   MCH 30.1 26.0 - 34.0 pg   MCHC 32.6 30.0 - 36.0 g/dL   RDW 13.5 11.5 - 15.5 %   Platelets 255 150 - 400 K/uL   Neutrophils Relative % 67 %  Neutro Abs 4.9 1.7 - 7.7 K/uL   Lymphocytes Relative 26 %   Lymphs Abs 1.9 0.7 - 4.0 K/uL   Monocytes Relative 6 %   Monocytes Absolute 0.5 0.1 - 1.0 K/uL   Eosinophils Relative 1 %   Eosinophils Absolute 0.1 0.0 - 0.7 K/uL   Basophils Relative 0 %   Basophils Absolute 0.0 0.0 - 0.1 K/uL  I-stat troponin, ED  Result Value Ref Range   Troponin i, poc 0.00 0.00 - 0.08 ng/mL   Comment 3          I-Stat CG4 Lactic Acid, ED  Result Value Ref Range   Lactic Acid, Venous 0.78 0.5 - 1.9 mmol/L   Assessment and Plan:   1. Pain of upper abdomen Recent ER records reviewed. No pain today. She finished PPI and needs a new Rx. Gastrointestinal ROS: no abdominal pain, change in bowel habits, or black or bloody stools.  - pantoprazole (PROTONIX) 40 MG tablet; Take 1 tablet (40 mg total) by mouth daily.  Dispense: 30 tablet; Refill: 0 - Comprehensive metabolic panel - Urinalysis, Routine w reflex microscopic - CBC - Urine Culture  2. CKD (chronic kidney disease), stage III ([place])  Lab Results  Component Value Date   CREATININE 1.03 12/10/2017   CREATININE 1.14 (H) 12/04/2017   CREATININE 1.22 (H) 11/26/2017   - Comprehensive metabolic panel - Urinalysis, Routine w reflex microscopic - CBC - Urine Culture  3. Dry skin dermatitis Continue prn Triamcinolone.   4. Atrial fibrillation, chronic (HCC) Continue Eliquis.   5. Chronic deep vein  thrombosis (DVT) of left lower extremity, unspecified vein (HCC) Continue Eliquis.   6. Physical deconditioning Home Health PT has already been ordered for this patient. She has necessary items for safety.  7. Type 2 diabetes mellitus with stage 3 chronic kidney disease, unspecified whether long term insulin use ([place])  Lab Results  Component Value Date   HGBA1C 6.9 (H) 09/21/2017   The patient is asked to make an attempt to improve diet patterns to aid in medical management of this problem.   8. Acquired hypothyroidism Recheck today.  - TSH  9. DNR (do not resuscitate) Discussed with the patient and all questioned fully answered. DNR status. Yellow forms completed to keep at home and on her person.    . Reviewed expectations re: course of current medical issues. . Discussed self-management of symptoms. . Outlined signs and symptoms indicating need for more acute intervention. . Patient verbalized understanding and all questions were answered. Marland Kitchen Health Maintenance issues including appropriate healthy diet, exercise, and smoking avoidance were discussed with patient. . See orders for this visit as documented in the electronic medical record. . Patient received an After Visit Summary.  Briscoe Deutscher, DO [place], [place] 12/12/2017  Records requested if needed. Time spent with the patient: 45 minutes, of which >50% was spent in obtaining information about her symptoms, reviewing her previous labs, evaluations, and treatments, counseling her about her condition (please see the discussed topics above), and developing a plan to further investigate it; she had a number of questions which I addressed.   We spent a significant amount of time reviewed medications and DE-PRESCRIBING.   HOME SITUATION: Lives and home and wants to stay there. Has three friends that help by: cleaning her house, bringing food, and checking in. She receives around 2K per month. She has a life-alert  bracelet. POA in flux. I have been made aware and given the latest information by  patient and her attorney. She is DNR status.

## 2017-12-10 NOTE — Telephone Encounter (Signed)
Copied from [place] [phone]. Topic: Quick Communication - See Telephone Encounter >> Dec 10, 2017  4:45 PM Boyd Kerbs wrote: CRM for notification. See Telephone encounter for:  Prescription request for triamcinolone ointment, lasix, ropinirole,Eliquis   [address] [place] [address] Phone: [phone] Fax: [phone]   12/10/17.

## 2017-12-11 MED ORDER — FUROSEMIDE 20 MG PO TABS: 20.0000 mg | ORAL_TABLET | ORAL | 2 refills | Status: DC | PRN

## 2017-12-11 MED ORDER — TRIAMCINOLONE ACETONIDE 0.5 % EX OINT: 1.0000 "application " | TOPICAL_OINTMENT | Freq: Two times a day (BID) | CUTANEOUS | 0 refills | Status: AC | PRN

## 2017-12-11 NOTE — Telephone Encounter (Signed)
Please advise on refill on the Eliquis and ropinirole.

## 2017-12-11 NOTE — Telephone Encounter (Signed)
Copied from [place]. Topic: General - Other >> Dec 11, 2017  2:12 PM Neva Seat wrote: [place] at [facility] [phone]  Verbal Order: Home Health occupational therapy 2 times a week for 4 weeks

## 2017-12-11 NOTE — Telephone Encounter (Signed)
Please be advised of the note below.

## 2017-12-11 NOTE — Telephone Encounter (Signed)
Verbal given to [place].

## 2017-12-11 NOTE — Telephone Encounter (Signed)
Copied from [place]. >> Dec 11, 2017  2:12 PM Neva Seat wrote: [place] at [facility] [phone]  Verbal Order: Home Health occupational therapy 2 times a week for 4 weeks

## 2017-12-11 NOTE — Telephone Encounter (Signed)
revw'd chart.  Last OV 12/10/17.  Will refill Triamcinolone Cream, and Furosemide.   Will send message to Dr. Juleen China re: Eliquis, as last refill on medication list was 10/09/14; and re: Ropinirole 5 mg, as rec'd an alert that 5 mg dose exceeds the 4 mg recommended daily dose.

## 2017-12-11 NOTE — Telephone Encounter (Signed)
Please be advised.  °

## 2017-12-12 ENCOUNTER — Encounter: Payer: Self-pay | Admitting: Family Medicine

## 2017-12-12 DIAGNOSIS — Z66 Do not resuscitate: Secondary | ICD-10-CM | POA: Insufficient documentation

## 2017-12-12 LAB — URINE CULTURE
MICRO NUMBER:: 90015018
SPECIMEN QUALITY:: ADEQUATE

## 2017-12-12 NOTE — Telephone Encounter (Signed)
  Current Outpatient Medications:  .  apixaban (ELIQUIS) 2.5 MG TABS tablet, Take 1 tablet (2.5 mg total) by mouth 2 (two) times daily., Disp: 60 tablet, Rfl: 0 .  furosemide (LASIX) 20 MG tablet, Take 1 tablet (20 mg total) by mouth every other day as needed for fluid or edema., Disp: 30 tablet, Rfl: 2 .  pantoprazole (PROTONIX) 40 MG tablet, Take 1 tablet (40 mg total) by mouth daily., Disp: 30 tablet, Rfl: 0 .  polyvinyl alcohol (LIQUIFILM TEARS) 1.4 % ophthalmic solution, Place 1 drop into both eyes as needed for dry eyes., Disp: 15 mL, Rfl: 0 .  ropinirole (REQUIP) 5 MG tablet, Take 5 mg by mouth at bedtime., Disp: , Rfl:  .  triamcinolone ointment (KENALOG) 0.5 %, Apply 1 application topically 2 (two) times daily as needed (irritation,rash)., Disp: 30 g, Rfl: 0  Medication list above is correct. Briscoe Deutscher   NOTE: THE LASIX IS PRN. I DO NOT KNOW HOW OFTEN SHE TAKES IT. WE WILL NEED TO ADD POTASSIUM IF SHE TAKES IT REGULARLY.

## 2017-12-14 ENCOUNTER — Other Ambulatory Visit: Payer: Self-pay

## 2017-12-14 DIAGNOSIS — E039 Hypothyroidism, unspecified: Secondary | ICD-10-CM

## 2017-12-14 MED ORDER — FLUCONAZOLE 150 MG PO TABS: 150.0000 mg | ORAL_TABLET | Freq: Once | ORAL | 0 refills | Status: AC

## 2017-12-14 MED ORDER — ROPINIROLE HCL 5 MG PO TABS: 5.0000 mg | ORAL_TABLET | Freq: Every day | ORAL | 1 refills | Status: DC

## 2017-12-14 MED ORDER — APIXABAN 2.5 MG PO TABS: 2.5000 mg | ORAL_TABLET | Freq: Two times a day (BID) | ORAL | 0 refills | Status: DC

## 2017-12-14 NOTE — Addendum Note (Signed)
Addended by: Briscoe Deutscher R on: 12/14/2017 12:10 PM   Modules accepted: Orders

## 2017-12-14 NOTE — Telephone Encounter (Signed)
Prescriptions sent to the pharmacy.

## 2017-12-14 NOTE — Addendum Note (Signed)
Addended by: Durwin Glaze on: 12/14/2017 01:32 PM   Modules accepted: Orders

## 2017-12-15 ENCOUNTER — Telehealth: Payer: Self-pay | Admitting: Family Medicine

## 2017-12-15 NOTE — Telephone Encounter (Signed)
See note

## 2017-12-15 NOTE — Telephone Encounter (Signed)
Copied from [place] [id number]. Topic: Quick Communication - See Telephone Encounter >> Dec 15, 2017 12:33 PM Boyd Kerbs wrote: CRM for notification. See Telephone encounter for:   Verbal order request for another Social Worker visit this month. To give more educational help Catalina Lunger - [facility] - [phone]  12/15/17.

## 2017-12-15 NOTE — Telephone Encounter (Signed)
Ok to give orders  °

## 2017-12-15 NOTE — Telephone Encounter (Signed)
Okay 

## 2017-12-16 NOTE — Telephone Encounter (Signed)
Called l/m with verbal ok

## 2017-12-21 ENCOUNTER — Telehealth: Payer: Self-pay | Admitting: Family Medicine

## 2017-12-21 ENCOUNTER — Ambulatory Visit: Payer: Self-pay | Admitting: *Deleted

## 2017-12-21 NOTE — Telephone Encounter (Signed)
Please call patient to discuss further as soon as possible due to reason for medication request.

## 2017-12-21 NOTE — Telephone Encounter (Signed)
Patient never went to ED.

## 2017-12-21 NOTE — Telephone Encounter (Signed)
Copied from [place] [phone]. Topic: Quick Communication - See Telephone Encounter >> Dec 21, 2017  3:55 PM Vernona Rieger wrote: CRM for notification. See Telephone encounter for:   12/21/17.  [facility] wants to know if DR Juleen China will give her something for depression. Call back is [phone]

## 2017-12-21 NOTE — Telephone Encounter (Signed)
I have spoke with the patients POA and they are trying to get the patient to agree to go to the ED. They are wanting to move forward with the palliative care such as Hospice.

## 2017-12-21 NOTE — Telephone Encounter (Signed)
I attempted 3 times to call Elon Spanner POA back without success to let her I know I had spoke with the flow coordinator who checked with Dr. Juleen China.   The pt needs to go to the ED.  If she refuses the office will call EMS to her house.  I called the flow coordinator back and made her aware I was not able to get in touch with Elon Spanner.    They are going to make Dr. Juleen China aware.

## 2017-12-21 NOTE — Telephone Encounter (Signed)
Spoke with Virgina Organ with [facility] to get information. Would you like me to move forward with referral to [facility].

## 2017-12-21 NOTE — Telephone Encounter (Signed)
Elon Spanner, the pt's POA  Called in with concerns for the pt.   Several issues going on. First-the pt has been vomiting Saturday and Sunday.   She has stage III renal disease.   She is not keeping much of anything down per Marcie Bal.  Marcie Bal is not with pt at the moment).    She was just released from the hospital and is refusing to go back.  I let Marcie Bal know she really needed to go to the ED to be evaluated for dehydration and possible UTI.   Per Marcie Bal she had these same symptoms when she was sick with the UTI that landed her in the ED and admitted to the hospital before.   She is also c/o abd pain.  Marcie Bal replied,  "I'll try my best to get her to go but she has not be agreeing to it so far".  Second-Janet mentioned the pt is very depressed because her son has abandoned her.   Her other family members haven't been in contact with her since before Christmas.   Marcie Bal thinks Ms. Siler has given up and is very depressed.  Third-She is wanting palliative care to be arranged.    A home health nurse/aid came out to the house last week and gave her a bath.  That was a big help.  Also the pt fell out of the bed Saturday morning.   EMS came out and checked her over and said nothing was broken.    Pt refused to be taken to the hospital to be evaluated.  Marcie Bal is going to try to get Ms. Bruney to the ED but doesn't know if she can.  She is requesting a call from the office in regards to further advice and help setting up care for Ms. Lavella Hammock.   Janet's husband is also POA for Ms. Asby so you can talk to Marcie Bal or her husband.   The best number is [phone].  She is requesting a call ASAP as she is very concerned about Ms. Sealy and doesn't know what steps to take from here as far as getting further help set up.  I routed a high priority note to Dr. Alcario Drought nurse pool making them aware of the situation.  Reason for Disposition . [1] SEVERE vomiting (e.g., 6 or more times/day) AND [2] present > 8  hours  Answer Assessment - Initial Assessment Questions 1. VOMITING SEVERITY: "How many times have you vomited in the past 24 hours?"     - MILD:  1 - 2 times/day    - MODERATE: 3 - 5 times/day, decreased oral intake without significant weight loss or symptoms of dehydration    - SEVERE: 6 or more times/day, vomits everything or nearly everything, with significant weight loss, symptoms of dehydration      Been vomiting Saturday and Sunday 2. ONSET: "When did the vomiting begin?"      Thinks it started Saturday 3. FLUIDS: "What fluids or food have you vomited up today?" "Have you been able to keep any fluids down?"     No   I think she may have a UTI.   She had these symptoms before. 4. ABDOMINAL PAIN: "Are your having any abdominal pain?" If yes : "How bad is it and what does it feel like?" (e.g., crampy, dull, intermittent, constant)      The last couple days. 5. DIARRHEA: "Is there any diarrhea?" If so, ask: "How many times today?"      I don't  know if she is having diarrhea 6. CONTACTS: "Is there anyone else in the family with the same symptoms?"      No contacts 7. CAUSE: "What do you think is causing your vomiting?"     UTI 8. HYDRATION STATUS: "Any signs of dehydration?" (e.g., dry mouth [not only dry lips], too weak to stand) "When did you last urinate?"     I think she needs to go to the hospital.  9. OTHER SYMPTOMS: "Do you have any other symptoms?" (e.g., fever, headache, vertigo, vomiting blood or coffee grounds, recent head injury)     No fever I don't think.   She's very weak. 10. PREGNANCY: "Is there any chance you are pregnant?" "When was your last menstrual period?"       N/A  Protocols used: [facility]

## 2017-12-21 NOTE — Telephone Encounter (Signed)
Yes, please.

## 2017-12-21 NOTE — Telephone Encounter (Signed)
See note

## 2017-12-21 NOTE — Telephone Encounter (Signed)
Spoke to [employer] from [place]. Informed that Dr. Juleen China is truly concerned for patients safety and health. We have spoke to her today several times as well as POA trying to have patient taken to ED for evaluation for new symptoms that are outlined in other phone messages. Informed that we will not be able to change medications without evaluation. Tiffany will call patient and tell her to go to ED as well.

## 2017-12-22 ENCOUNTER — Telehealth: Payer: Self-pay | Admitting: Family Medicine

## 2017-12-22 NOTE — Telephone Encounter (Signed)
[facility] referral has been placed and spoke Hospice nurse that went out for visit today.

## 2017-12-22 NOTE — Telephone Encounter (Signed)
Please see note below and call the number provided to advise.   Copied from [place] [phone]. Topic: Quick Communication - Rx Refill/Question >> Dec 22, 2017 12:09 PM Oliver Pila B wrote: Mary from [facility] and palliative care called to see if the pt could go on Rx for anxiety or antidepressant , contact [facility] @ [phone], she had other readings that she can discuss as well

## 2017-12-22 NOTE — Telephone Encounter (Signed)
Copied from [place]. Topic: Quick Communication - See Telephone Encounter >> Dec 22, 2017  4:11 PM Robina Ade, Helene Kelp D wrote: Ander Purpura from [facility] OT department called to give provider on patients update. Patient did not want to get out of bed, and looked lost/confused. Patient is also not participating on her therapy and she has only ate cereal today. Lauren can be reached at [phone].CRM for notification. See Telephone encounter for: 12/22/17.

## 2017-12-22 NOTE — Telephone Encounter (Signed)
See note

## 2017-12-22 NOTE — Telephone Encounter (Signed)
Spoke with [name] at [facility] and she stated that they come out about once a month. She stated that the son had discontinued the services and now someone contacted them to come back out. The nurse stated that [name] is needing something for anxiety and depression. Patient has a history of depression. Patient told her that she wants something to stop the thoughts while she is sleeping. Neighbor said they will pick up the medication if it is prescribed.    Nurse also said that Lasix and Protonix was not in her pill box at home so she is not sure if she is taking. The nurse said that her vitals was stable today.   I told the nurse that we have placed a referral for [facility] as well. She said that was fine with them.

## 2017-12-23 ENCOUNTER — Emergency Department (HOSPITAL_COMMUNITY)
Admission: EM | Admit: 2017-12-23 | Discharge: 2017-12-23 | Disposition: A | Discharge status: Home / Self Care | Payer: Medicare Other | Attending: Emergency Medicine | Admitting: Emergency Medicine

## 2017-12-23 ENCOUNTER — Encounter (HOSPITAL_COMMUNITY): Payer: Self-pay | Admitting: Nurse Practitioner

## 2017-12-23 DIAGNOSIS — Z79899 Other long term (current) drug therapy: Secondary | ICD-10-CM | POA: Insufficient documentation

## 2017-12-23 DIAGNOSIS — I129 Hypertensive chronic kidney disease with stage 1 through stage 4 chronic kidney disease, or unspecified chronic kidney disease: Secondary | ICD-10-CM | POA: Diagnosis not present

## 2017-12-23 DIAGNOSIS — R5383 Other fatigue: Secondary | ICD-10-CM | POA: Insufficient documentation

## 2017-12-23 DIAGNOSIS — E1122 Type 2 diabetes mellitus with diabetic chronic kidney disease: Secondary | ICD-10-CM | POA: Insufficient documentation

## 2017-12-23 DIAGNOSIS — E039 Hypothyroidism, unspecified: Secondary | ICD-10-CM | POA: Insufficient documentation

## 2017-12-23 DIAGNOSIS — N184 Chronic kidney disease, stage 4 (severe): Secondary | ICD-10-CM | POA: Diagnosis not present

## 2017-12-23 LAB — BASIC METABOLIC PANEL
Anion gap: 11 (ref 5–15)
BUN: 21 mg/dL — AB (ref 6–20)
CHLORIDE: 106 mmol/L (ref 101–111)
CO2: 22 mmol/L (ref 22–32)
CREATININE: 1.24 mg/dL — AB (ref 0.44–1.00)
Calcium: 9.2 mg/dL (ref 8.9–10.3)
GFR calc non Af Amer: 35 mL/min — ABNORMAL LOW (ref >60)
GFR, EST AFRICAN AMERICAN: 41 mL/min — AB (ref >60)
Glucose, Bld: 227 mg/dL — ABNORMAL HIGH (ref 65–99)
POTASSIUM: 4.8 mmol/L (ref 3.5–5.1)
Sodium: 139 mmol/L (ref 135–145)

## 2017-12-23 LAB — URINALYSIS, ROUTINE W REFLEX MICROSCOPIC
Bilirubin Urine: NEGATIVE
GLUCOSE, UA: NEGATIVE mg/dL
HGB URINE DIPSTICK: NEGATIVE
Ketones, ur: 5 mg/dL — AB
LEUKOCYTES UA: NEGATIVE
Nitrite: NEGATIVE
PH: 5 (ref 5.0–8.0)
PROTEIN: NEGATIVE mg/dL
Specific Gravity, Urine: 1.019 (ref 1.005–1.030)

## 2017-12-23 LAB — CBC
HEMATOCRIT: 37.1 % (ref 36.0–46.0)
Hemoglobin: 12.3 g/dL (ref 12.0–15.0)
MCH: 30.5 pg (ref 26.0–34.0)
MCHC: 33.2 g/dL (ref 30.0–36.0)
MCV: 92.1 fL (ref 78.0–100.0)
PLATELETS: 244 10*3/uL (ref 150–400)
RBC: 4.03 MIL/uL (ref 3.87–5.11)
RDW: 13.8 % (ref 11.5–15.5)
WBC: 6.3 10*3/uL (ref 4.0–10.5)

## 2017-12-23 LAB — CBG MONITORING, ED: Glucose-Capillary: 160 mg/dL — ABNORMAL HIGH (ref 65–99)

## 2017-12-23 NOTE — Telephone Encounter (Signed)
Spoke with Charge note and advised of Dr. Juleen China note. She is going to give the information to the doctor there.

## 2017-12-23 NOTE — ED Notes (Signed)
Social worker at bedside speaking with patient about plan of care.

## 2017-12-23 NOTE — ED Notes (Signed)
PLEASE REVIEW MD NOTE FROM OFFICE IN CHART-SAFETY CONCERNS

## 2017-12-23 NOTE — ED Provider Notes (Signed)
[facility] EMERGENCY DEPARTMENT Provider Note   CSN: [id number] Arrival date & time: 12/23/17  1134     History   Chief Complaint Chief Complaint  Patient presents with  . Weakness    HPI [name] is a 84 y.o. female.  HPI Patient presents to the emergency room for evaluate about concerns with her living at home.  Patient lives at home by herself.  However, she has neighbors and close friends that check on her regularly.  She is here with her medical power of attorney who is a Industrial/product designer and his friend.  Patient has home health checking [facility] but home health has been sending messages to the primary care doctor indicating they think she needs further assistance at the house.  Patient is not interested in going to a facility or an assisted living facility.  She likes her home, she has a dog and does not want to leave.  Patient complains of generalized weakness for several months but no acute complaints.  She is not have any chest pain or shortness of breath.  Patient was able to walk into the emergency room on her own. Past Medical History:  Diagnosis Date  . Acute gastritis without mention of hemorrhage   . Anemia   . Anxiety   . Arthritis    "LLL; palm of right hand" (06/12/2015)  . Atrial fibrillation ([place])   . CAP (community acquired pneumonia) 04/03/2014  . Crohn disease ([place])   . Cyst and pseudocyst of pancreas   . Depression   . Diaphragmatic hernia without mention of obstruction or gangrene   . Diverticulosis of colon (without mention of hemorrhage)   . Duodenitis without mention of hemorrhage   . DVT (deep venous thrombosis) ([place])    "twice on the left leg; once on the right leg" (06/12/2015)  . Dysrhythmia   . Esophageal reflux   . Hiatal hernia- moderate to large 10/06/2014  . Hx pulmonary embolism   . Hypothyroidism   . Insomnia, unspecified   . Left leg DVT ([place]) jan 2013  . Migraine    hx  . OAB (overactive bladder)   . Other and  unspecified hyperlipidemia   . Other malaise and fatigue   . Pancreas divisum 03/02/2015  . Pancreatic cyst   . Personal history of unspecified digestive disease   . Polymyalgia rheumatica ([place])   . Protein-calorie malnutrition, severe ([place]) 10/02/2014  . Restless legs syndrome (RLS)   . Shingles   . Stricture and stenosis of esophagus   . Type II diabetes mellitus ([place])   . Unspecified essential hypertension   . Unspecified sleep apnea    hx of sleep apnea, none since weight loss (06/12/2015)  . Urinary frequency   . Urinary urgency     Patient Active Problem List   Diagnosis Date Noted  . DNR (do not resuscitate) 12/12/2017  . CKD (chronic kidney disease), stage III ([place]) 11/22/2017  . Hypertension 11/22/2017  . DVT (deep vein thrombosis) in pregnancy ([place]) 11/22/2017  . Atrial fibrillation, chronic ([place]) 11/22/2017  . Depression 11/22/2017  . Protein-calorie malnutrition, moderate ([place]) 11/22/2017  . Dry skin dermatitis 09/24/2017  . Osteoarthritis 07/31/2017  . CKD stage 4 due to type 2 diabetes mellitus ([place]) 04/19/2017  . Iron deficiency anemia 04/19/2017  . B12 deficiency 04/08/2017  . Chronic constipation 04/08/2017  . Chronic deep vein thrombosis (DVT) of left lower extremity ([place]) 04/08/2017  . Episode of recurrent major depressive disorder ([place]) 04/08/2017  .  Monoclonal gammopathy of unknown significance (MGUS) 04/08/2017  . Peripheral neuropathy 04/08/2017  . Rectal mass   . Abdominal pain 06/12/2015  . UTI (urinary tract infection) 03/02/2015  . Pancreas divisum 03/02/2015  . Physical deconditioning 03/01/2015  . Nausea vomiting and diarrhea   . Pancreatic cyst   . Hiatal hernia- moderate to large 10/06/2014  . Heme + stool 10/04/2014  . Crohn's ileocolitis ([place]) 10/04/2014  . 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 Arthritis   . Hx pulmonary embolism   . Hypothyroidism 04/11/2014  . Type 2 diabetes mellitus with stage 3 chronic kidney disease ([place]) 04/11/2014  .  Anemia of chronic disease 04/03/2014  . OAB (overactive bladder) 01/07/2012  . Restless leg syndrome 03/09/2008  . Benign essential hypertension 03/09/2008  . ESOPHAGEAL STRICTURE 03/09/2008  . GERD (gastroesophageal reflux disease) 03/09/2008  . Diverticulosis of large intestine 03/09/2008  . Polymyalgia rheumatica ([place]) 03/09/2008    Past Surgical History:  Procedure Laterality Date  . BACK SURGERY    . CATARACT EXTRACTION, BILATERAL Bilateral   . CLOSED REDUCTION NASAL FRACTURE  08/2008   Archie Endo 04/10/2011  . COLON SURGERY  June 17, 2012   surgery for bleed  . COLONOSCOPY N/A 06/13/2015   Procedure: COLONOSCOPY;  Surgeon: Irene Shipper, MD;  Location: [facility];  Service: Endoscopy;  Laterality: N/A;  . OOPHORECTOMY  1944   not sure which ovary  . POSTERIOR LUMBAR FUSION  01/2009   L4-L5 Archie Endo 04/09/2011  . REDUCTION MAMMAPLASTY Bilateral 1980  . ROTATOR CUFF REPAIR Right   . SHOULDER OPEN ROTATOR CUFF REPAIR  08/04/2012   Procedure: ROTATOR CUFF REPAIR SHOULDER OPEN;  Surgeon: Tobi Bastos, MD;  Location: [facility];  Service: Orthopedics;  Laterality: Left;  with Anchors and Graft  . SHOULDER OPEN ROTATOR CUFF REPAIR Right 2006   Archie Endo 04/22/2011  . TONSILLECTOMY      OB History    No data available       Home Medications    Prior to Admission medications   Medication Sig Start Date End Date Taking? Authorizing Provider  apixaban (ELIQUIS) 2.5 MG TABS tablet Take 1 tablet (2.5 mg total) by mouth 2 (two) times daily. 12/14/17   Briscoe Deutscher, DO  furosemide (LASIX) 20 MG tablet Take 1 tablet (20 mg total) by mouth every other day as needed for fluid or edema. 12/11/17   Briscoe Deutscher, DO  pantoprazole (PROTONIX) 40 MG tablet Take 1 tablet (40 mg total) by mouth daily. 12/10/17   Briscoe Deutscher, DO  polyvinyl alcohol (LIQUIFILM TEARS) 1.4 % ophthalmic solution Place 1 drop into both eyes as needed for dry eyes. 11/24/17   Florencia Reasons, MD  ropinirole (REQUIP) 5 MG tablet Take 1  tablet (5 mg total) by mouth at bedtime. 12/14/17   Briscoe Deutscher, DO  triamcinolone ointment (KENALOG) 0.5 % Apply 1 application topically 2 (two) times daily as needed (irritation,rash). 12/11/17   Briscoe Deutscher, DO    Family History Family History  Problem Relation Age of Onset  . Melanoma Daughter        died age 85  . Melanoma Son     Social History Social History   Tobacco Use  . Smoking status: Never Smoker  . Smokeless tobacco: Never Used  Substance Use Topics  . Alcohol use: No  . Drug use: No     Allergies   Sulfa antibiotics and Penicillins   Review of Systems Review of Systems  All other systems reviewed and are negative.  Physical Exam Updated Vital Signs BP (!) 152/67   Pulse (!) 52   Temp 97.6 F (36.4 C) (Oral)   Resp 18   Ht 1.524 m (5')   Wt 49.9 kg (110 lb)   SpO2 98%   BMI 21.48 kg/m   Physical Exam  Constitutional: No distress.  Elderly, frail  HENT:  Head: Normocephalic and atraumatic.  Right Ear: External ear normal.  Left Ear: External ear normal.  Eyes: Conjunctivae are normal. Right eye exhibits no discharge. Left eye exhibits no discharge. No scleral icterus.  Neck: Neck supple. No tracheal deviation present.  Cardiovascular: Normal rate, regular rhythm and intact distal pulses.  Pulmonary/Chest: Effort normal and breath sounds normal. No stridor. No respiratory distress. She has no wheezes. She has no rales.  Abdominal: Soft. Bowel sounds are normal. She exhibits no distension. There is no tenderness. There is no rebound and no guarding.  Musculoskeletal: She exhibits no edema or tenderness.  Neurological: She is alert. She has normal strength. No cranial nerve deficit (no facial droop, extraocular movements intact, no slurred speech) or sensory deficit. She exhibits normal muscle tone. She displays no seizure activity. Coordination normal.  Skin: Skin is warm and dry. No rash noted.  Superficial small scabs on her lower leg  insistent with areas the patient states she has been scratching, no erythema  Psychiatric: She has a normal mood and affect.  Nursing note and vitals reviewed.    ED Treatments / Results  Labs (all labs ordered are listed, but only abnormal results are displayed) Labs Reviewed  BASIC METABOLIC PANEL - Abnormal; Notable for the following components:      Result Value   Glucose, Bld 227 (*)    BUN 21 (*)    Creatinine, Ser 1.24 (*)    GFR calc non Af Amer 35 (*)    GFR calc Af Amer 41 (*)    All other components within normal limits  URINALYSIS, ROUTINE W REFLEX MICROSCOPIC - Abnormal; Notable for the following components:   APPearance HAZY (*)    Ketones, ur 5 (*)    All other components within normal limits  CBG MONITORING, ED - Abnormal; Notable for the following components:   Glucose-Capillary 160 (*)    All other components within normal limits  CBC    Radiology No results found.  Procedures Procedures (including critical care time)  Medications Ordered in ED Medications - No data to display   Initial Impression / Assessment and Plan / ED Course  I have reviewed the triage vital signs and the nursing notes.  Pertinent labs & imaging results that were available during my care of the patient were reviewed by me and considered in my medical decision making (see chart for details).   Patient presented to the emergency room for evaluation of possible safety concerns about living at home.  Patient was here accompanied by her friend and neighbor who is her power of attorney.  Apparently there was some issues in the past with wife of the patient's son who was her prior power of attorney.  Patient is alert and oriented.  She does not seem confused.  She is not interested in and living in a nursing facility or currently at an assisted living facility.  She feels very comfortable at her house.  She has neighbors that check on her.  1 of her close friends, Mr. Delice Bison who is her  power of attorney is with her here today in the emergency room.  Case management was consulted.  Social work also saw the patient in the emergency room.  Case management has contacted her outpatient home health agencies.  We see RN Kritzer's note for additional information.  The patient appears to be safe to go home this evening.  She will continue to receive outpatient resources.  Patient's power of attorney is agreeable to this plan.  Final Clinical Impressions(s) / ED Diagnoses   Final diagnoses:  Fatigue, unspecified type    ED Discharge Orders    None       Dorie Rank, MD 12/23/17 1810

## 2017-12-23 NOTE — Telephone Encounter (Signed)
I called to tell the patient about the [facility] referral today. When I spoke with the patient she sounded very weak. I ask her how she was doing and she stated that she was very weak. I ask her what she had eaten and she said that she had eaten a bowl of cereal. She said that she had sit up long enough to eat her cereal and then felt so week that she had to lay back down. Patient stated that she was not having any pain at this time. She said that she had water by her bed and was drinking. She stated that she had not eaten much yesterday. I informed patient that Dr. Juleen China wants her to go to the ED to be evaluated. I explained that we could call EMS to come and get her, but patient stated that she has so much debt already from calling them. Patient agreed to have me call one of her friends to come pick her up. She then said that she had a VA rep that came out about a week ago to discuss increasing her benefits to help with having someone come sit with her. I also advised the patient that we had placed a referral to come out an evaluate her. Patient was fine with this.   I tried to call the neighbor to take her to the hospital, but got no answer. I then called the POA to see if she could take her. Marcie Bal the POA said that she did not understand why Ms.Giangregorio needed to go to the ED after the Hospice NP came out yesterday and said that she was fine. I explained that her doctor has been trying to get her to go to the ED since Monday. I also explained that I had spoke with the patient this AM and she was feeling weak. I also explained that the patient stated that she has not been eating much per the patient. Marcie Bal finally agreed that she would get into contact with someone to take the patient to the ED.   While I also had Marcie Bal on the phone I explained that we had placed a referral to [facility] to come out and evaluate MS. Lavella Hammock. She ask what this was and I explained that they come out to evaluate to determine if the patient is  safe at home or if they need to be in a facility. We then was talking about Hospice and she stated that she had not contacted them and thought that we had. I explained that we had referred [facility] instead. It is undetermined who contacted Hospice to come back out.   I called Ms. Maltz back to let her know that I spoke with Marcie Bal and that she was going to get someone to take her. I told the patient that if someone does not come then she would need to call EMS herself.

## 2017-12-23 NOTE — Care Management Note (Signed)
Case Management Note  Patient Details  Name: [name] MRN: [id number] Date of Birth: 01-19-1919  CM consulted regarding pt's home situation.  After chart review it appears that pt's PCP made new referrals to [facility] and to hospice as of yesterday.  CM is unsure that pt will qualify for [facility] financially due to [place] benefits and other resources or that she will want their serivces if she does as she would have to give up her current PCP and [facility].  CM is also unsure that pt would qualify for hospice services but possibly could qualify palliative care based on information avalible.  CM noted that pt was active with [facility].  Contacted Lattie Haw who states pt is active with [facility] PT OT NA SW and that pt receives [place] PCS 2 times a week.  CM was advised that per notes from [facility] CSW on 12/11/2017 that pt has full capacity and does not want to move to an ALF.  [facility] CSW additionally has planned for financial POA, Timmothy Sours, to fill out more VA paper work to increase the pts PCS VA benefits which she is eligible for and a return [place] CSW visit later this month.  Pt has a life alert necklace and HH CSW felt comfortable with all of pt's answers during that time on how to use the life alert services.  CM feels that pt has more that enough community resources and support for a safe transition back home from the ED tonight and that if pt is refusing [facility] PT/OT as per notes from PCP office on 12/22/2017, pt has the right to do so.    CM updated Dr. Tomi Bamberger on all information provided to which he agreed for transition plan to home from the ED.  CM asked CSW Lowella Grip who is on the [facility] go speak with pt and Don in-person to reiterate information they should already know but may need reminding and for additional encouragement that the right steps are being taken for the pt and her future care as per her wishes.  CM additionally asked CSW to remind them to contact [facility] with any further questions as well as to reach  out to them on the status of the [place] paperwork.  No further CM needs noted at this time.  Expected Discharge Date:    12/23/2017              Expected Discharge Plan:  [place]  Discharge planning Services  CM Consult  Post Acute Care Choice:  Home Health Choice offered to:  Patient  HH Arranged:  RN, PT, OT, Nurse's Aide, Social Work, Designer, jewellery, Refused SNF [place]:  [facility] [employer] [place] choice  Status of Service:  Completed, signed off  Rae Mar, RN 12/23/2017, 5:33 PM

## 2017-12-23 NOTE — Discharge Instructions (Signed)
Follow-up with your primary care doctor as planned.  Your home health agency should be contacting you to continue to provide additional assistance

## 2017-12-23 NOTE — ED Triage Notes (Addendum)
Pt presents with medical POA due to [facility] calling to tell patient to get vital signs check in ER. Pt endorses generalized weakness ongoing for the past few months. Patient alert and oriented x4. No  Neuro deficits noted. Pt lives alone and has home health visit throughout the week but is unable to afford 24/7 assistance and does not want to leave home.     Medical POA Candis Musa [phone]

## 2017-12-23 NOTE — Telephone Encounter (Signed)
Please call the charge nurse to let them know to look at the message below. I don't feel that the patient is safe at home. She should not go home without 24/7 care.

## 2017-12-23 NOTE — Progress Notes (Signed)
CSW spoke with pt and pt's POA, Don. CSW relayed information from CM to pt and POA. Pt is declining SNF or facility placement at this time. CSW explained the services established with [facility]. [facility] has pt set up Nurse, PT, OT, Social Work, Aid. Pt also has personal care aid coming from the [place] two times a week. CSW explained to pt and POA that the social worker from [facility] stated that he is working with the [place] to increase the number of times the personal care aid visits the home and that there is paperwork for the POA, Don to sign. Social Worker from [place] and the pt stated that pt is comfortable and knows how to use her life alert necklace. Pt stated she has had the life alert necklace for a number of years and always wears it. Pt also stated that she has a number of friends that check in with her daily.  Both pt and POA feel comfortable with pt returning home. CSW provided pt and POA with phone number if any questions arise.   Wendelyn Breslow, [facility]  [phone]

## 2017-12-23 NOTE — Telephone Encounter (Signed)
I will call the patient

## 2017-12-24 ENCOUNTER — Emergency Department (HOSPITAL_COMMUNITY)
Admission: EM | Admit: 2017-12-24 | Discharge: 2017-12-25 | Disposition: A | Discharge status: Home / Self Care | Payer: Medicare Other | Attending: Physician Assistant | Admitting: Physician Assistant

## 2017-12-24 ENCOUNTER — Telehealth: Payer: Self-pay | Admitting: Family Medicine

## 2017-12-24 ENCOUNTER — Other Ambulatory Visit: Payer: Self-pay

## 2017-12-24 ENCOUNTER — Encounter (HOSPITAL_COMMUNITY): Payer: Self-pay | Admitting: Nurse Practitioner

## 2017-12-24 DIAGNOSIS — I10 Essential (primary) hypertension: Secondary | ICD-10-CM | POA: Insufficient documentation

## 2017-12-24 DIAGNOSIS — R440 Auditory hallucinations: Secondary | ICD-10-CM | POA: Diagnosis present

## 2017-12-24 DIAGNOSIS — R441 Visual hallucinations: Secondary | ICD-10-CM | POA: Insufficient documentation

## 2017-12-24 DIAGNOSIS — I4891 Unspecified atrial fibrillation: Secondary | ICD-10-CM | POA: Insufficient documentation

## 2017-12-24 DIAGNOSIS — F29 Unspecified psychosis not due to a substance or known physiological condition: Secondary | ICD-10-CM | POA: Diagnosis not present

## 2017-12-24 DIAGNOSIS — Z86718 Personal history of other venous thrombosis and embolism: Secondary | ICD-10-CM | POA: Diagnosis not present

## 2017-12-24 DIAGNOSIS — Z79899 Other long term (current) drug therapy: Secondary | ICD-10-CM | POA: Diagnosis not present

## 2017-12-24 DIAGNOSIS — Z86711 Personal history of pulmonary embolism: Secondary | ICD-10-CM | POA: Insufficient documentation

## 2017-12-24 DIAGNOSIS — R4587 Impulsiveness: Secondary | ICD-10-CM | POA: Diagnosis not present

## 2017-12-24 DIAGNOSIS — R443 Hallucinations, unspecified: Secondary | ICD-10-CM

## 2017-12-24 DIAGNOSIS — R413 Other amnesia: Secondary | ICD-10-CM | POA: Diagnosis not present

## 2017-12-24 LAB — URINALYSIS, ROUTINE W REFLEX MICROSCOPIC
GLUCOSE, UA: NEGATIVE mg/dL
Hgb urine dipstick: NEGATIVE
KETONES UR: NEGATIVE mg/dL
LEUKOCYTES UA: NEGATIVE
Nitrite: NEGATIVE
PROTEIN: NEGATIVE mg/dL
Specific Gravity, Urine: >1.03 — ABNORMAL HIGH (ref 1.005–1.030)
pH: 5.5 (ref 5.0–8.0)

## 2017-12-24 LAB — CBC WITH DIFFERENTIAL/PLATELET
Basophils Absolute: 0 10*3/uL (ref 0.0–0.1)
Basophils Relative: 1 %
Eosinophils Absolute: 0.3 10*3/uL (ref 0.0–0.7)
Eosinophils Relative: 4 %
HCT: 35.3 % — ABNORMAL LOW (ref 36.0–46.0)
Hemoglobin: 11.5 g/dL — ABNORMAL LOW (ref 12.0–15.0)
Lymphocytes Relative: 39 %
Lymphs Abs: 2.6 10*3/uL (ref 0.7–4.0)
MCH: 29.7 pg (ref 26.0–34.0)
MCHC: 32.6 g/dL (ref 30.0–36.0)
MCV: 91.2 fL (ref 78.0–100.0)
Monocytes Absolute: 0.5 10*3/uL (ref 0.1–1.0)
Monocytes Relative: 8 %
Neutro Abs: 3.2 10*3/uL (ref 1.7–7.7)
Neutrophils Relative %: 48 %
Platelets: 215 10*3/uL (ref 150–400)
RBC: 3.87 MIL/uL (ref 3.87–5.11)
RDW: 13.8 % (ref 11.5–15.5)
WBC: 6.6 10*3/uL (ref 4.0–10.5)

## 2017-12-24 LAB — BASIC METABOLIC PANEL
Anion gap: 7 (ref 5–15)
BUN: 29 mg/dL — ABNORMAL HIGH (ref 6–20)
CO2: 26 mmol/L (ref 22–32)
Calcium: 9.2 mg/dL (ref 8.9–10.3)
Chloride: 106 mmol/L (ref 101–111)
Creatinine, Ser: 1.41 mg/dL — ABNORMAL HIGH (ref 0.44–1.00)
GFR calc Af Amer: 35 mL/min — ABNORMAL LOW (ref >60)
GFR calc non Af Amer: 30 mL/min — ABNORMAL LOW (ref >60)
Glucose, Bld: 127 mg/dL — ABNORMAL HIGH (ref 65–99)
Potassium: 4.1 mmol/L (ref 3.5–5.1)
Sodium: 139 mmol/L (ref 135–145)

## 2017-12-24 MED ORDER — SODIUM CHLORIDE 0.9 % IV BOLUS (SEPSIS)
500.0000 mL | Freq: Once | INTRAVENOUS | Status: AC
  Administered 2017-12-24: 500 mL via INTRAVENOUS

## 2017-12-24 NOTE — ED Notes (Signed)
Patient's neighbors at bedside, stating that the patient called and told them she was ready to go. Patient and visitors made aware patient is not discharged.

## 2017-12-24 NOTE — ED Notes (Signed)
Patient reports hearing "shootings through the walls."

## 2017-12-24 NOTE — BH Assessment (Signed)
[place] [facility] Assessment Progress Note  Case discussed with Lindon Romp, NP who recommends inpt treatment. TTS to seek placement at a gero-psych facility. EDP Dr. Thomasene Lot, MD advised of disposition and in agreement. TTS attempted to inform the pt's nurse but was placed on hold for several minutes and nurse did not come to the phone.  Lind Covert, MSW, LCSW Therapeutic Triage Specialist  [phone]

## 2017-12-24 NOTE — ED Triage Notes (Signed)
Patient brought in by EMS from home. Her home health nurse said she was having auditory and visual hallucinations. Patient was a cone yesterday and diag with fatigue and sent home. Patient was not having any of these symptoms yesterday these are new.

## 2017-12-24 NOTE — ED Notes (Signed)
Not enough urine collected for a urine culture

## 2017-12-24 NOTE — ED Notes (Signed)
Took patient into restroom to get urine sample. Patient urinated but also stooled into the hat. Will try again later to urinate.

## 2017-12-24 NOTE — Telephone Encounter (Signed)
Copied from [place] [id number]. Topic: General - Other >> Dec 24, 2017  3:41 PM Yvette Rack wrote: Reason for CRM: Blythe [employer] Caregiver of patient [phone] calling to let Dr Juleen China know that the pt started having really bad hallucination the EMS id there now they seem to come almost everyday she start seeing her son and animals in the house she was banging and screaming on windows today around 2:00 EMS tried to call the practice yesterday and someone told them that  the practice doesn't have paperwork that Blythe Stanford dropped off for Dr Juleen China to fill out the [place] [phone] was very upset and hollering wanting to speak with Juleen China stating that they need help with patient ASAP the patients neighbor Butch Penny is there with them the Care Giver states that Dr Juleen China knows all of this and need to speak with her for help ASAP
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
## 2017-12-25 ENCOUNTER — Emergency Department (HOSPITAL_COMMUNITY): Payer: Medicare Other

## 2017-12-25 DIAGNOSIS — R440 Auditory hallucinations: Secondary | ICD-10-CM | POA: Diagnosis not present

## 2017-12-25 DIAGNOSIS — R441 Visual hallucinations: Secondary | ICD-10-CM | POA: Diagnosis not present

## 2017-12-25 DIAGNOSIS — R4182 Altered mental status, unspecified: Secondary | ICD-10-CM | POA: Insufficient documentation

## 2017-12-25 DIAGNOSIS — R4587 Impulsiveness: Secondary | ICD-10-CM | POA: Diagnosis not present

## 2017-12-25 DIAGNOSIS — R413 Other amnesia: Secondary | ICD-10-CM | POA: Diagnosis not present

## 2017-12-25 NOTE — ED Notes (Signed)
Patient admitted on unit. Patient constantly requesting to go home. Patient redirected multiple times from using TV remote to make a call. Support offered as needed. Will continue to monitor patient.

## 2017-12-25 NOTE — Consult Note (Signed)
[name] Consult   Reason for Consult:  Hallucinations Referring Physician:  EDP Patient Identification: [name] MRN:  [id number] Principal Diagnosis: Auditory hallucinations Diagnosis:   Patient Active Problem List   Diagnosis Date Noted  . Auditory hallucinations [R44.0] 12/25/2017  . DNR (do not resuscitate) [Z66] 12/12/2017  . CKD (chronic kidney disease), stage III ([place]) [N18.3] 11/22/2017  . Hypertension [I10] 11/22/2017  . DVT (deep vein thrombosis) in pregnancy ([place]) [O22.30, I82.409] 11/22/2017  . Atrial fibrillation, chronic ([place]) [I48.2] 11/22/2017  . Depression [F32.9] 11/22/2017  . Protein-calorie malnutrition, moderate ([place]) [E44.0] 11/22/2017  . Dry skin dermatitis [L85.3] 09/24/2017  . Osteoarthritis [M19.90] 07/31/2017  . CKD stage 4 due to type 2 diabetes mellitus ([place]) [H82.99, N18.4] 04/19/2017  . Iron deficiency anemia [D50.9] 04/19/2017  . B12 deficiency [E53.8] 04/08/2017  . Chronic constipation [K59.09] 04/08/2017  . Chronic deep vein thrombosis (DVT) of left lower extremity ([place]) [I82.502] 04/08/2017  . Episode of recurrent major depressive disorder ([place]) [F33.9] 04/08/2017  . Monoclonal gammopathy of unknown significance (MGUS) [D47.2] 04/08/2017  . Peripheral neuropathy [G62.9] 04/08/2017  . Rectal mass [K62.9]   . Abdominal pain [R10.9] 06/12/2015  . UTI (urinary tract infection) [N39.0] 03/02/2015  . Pancreas divisum [Q45.3] 03/02/2015  . Physical deconditioning [R53.81] 03/01/2015  . Nausea vomiting and diarrhea [R11.2, R19.7]   . Pancreatic cyst [K86.2]   . Hiatal hernia- moderate to large [K44.9] 10/06/2014  . Heme + stool [R19.5] 10/04/2014  . Crohn's ileocolitis ([place]) [K50.80] 10/04/2014  . 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 Arthritis [M19.90]   . Hx pulmonary embolism [Z86.711]   . Hypothyroidism [E03.9] 04/11/2014  . Type 2 diabetes mellitus with stage 3 chronic kidney disease ([place]) [B71.69, N18.3] 04/11/2014  .  Anemia of chronic disease [D63.8] 04/03/2014  . OAB (overactive bladder) [N32.81] 01/07/2012  . Restless leg syndrome [G25.81] 03/09/2008  . Benign essential hypertension [I10] 03/09/2008  . ESOPHAGEAL STRICTURE [K22.2] 03/09/2008  . GERD (gastroesophageal reflux disease) [K21.9] 03/09/2008  . Diverticulosis of large intestine [K57.30] 03/09/2008  . Polymyalgia rheumatica ([place]) [M35.3] 03/09/2008    Total Time spent with patient: 45 minutes  Subjective:   [name] is a 84 y.o. female patient admitted with auditory and visual hallucinations.  HPI:  Pt was seen and chart reviewed with treatment team and Dr Mariea Clonts. Pt was calm and cooperative during assessment. Pt stated she saw two kitties in her room yesterday. Pt was aware of the month, year, her name and where she lives. Today,  Pt denies suicidal/homicidal ideation, denies auditory/visual hallucinations and does not appear to be responding to internal stimuli. Pt has home health services in place. Pt is psychiatrically clear for discharge.   Past Psychiatric History: As above  Risk to Self: None Risk to Others: None Prior Inpatient Therapy: Prior Inpatient Therapy: No Prior Outpatient Therapy: Prior Outpatient Therapy: No Does patient have an ACCT team?: No Does patient have Intensive In-House Services?  : No Does patient have [facility] services? : No Does patient have P4CC services?: No  Past Medical History:  Past Medical History:  Diagnosis Date  . Acute gastritis without mention of hemorrhage   . Anemia   . Anxiety   . Arthritis    "LLL; palm of right hand" (06/12/2015)  . Atrial fibrillation ([place])   . CAP (community acquired pneumonia) 04/03/2014  . Crohn disease ([place])   . Cyst and pseudocyst of pancreas   . Depression   . Diaphragmatic hernia without mention of  obstruction or gangrene   . Diverticulosis of colon (without mention of hemorrhage)   . Duodenitis without mention of hemorrhage   . DVT (deep venous  thrombosis) ([place])    "twice on the left leg; once on the right leg" (06/12/2015)  . Dysrhythmia   . Esophageal reflux   . Hiatal hernia- moderate to large 10/06/2014  . Hx pulmonary embolism   . Hypothyroidism   . Insomnia, unspecified   . Left leg DVT ([place]) jan 2013  . Migraine    hx  . OAB (overactive bladder)   . Other and unspecified hyperlipidemia   . Other malaise and fatigue   . Pancreas divisum 03/02/2015  . Pancreatic cyst   . Personal history of unspecified digestive disease   . Polymyalgia rheumatica ([place])   . Protein-calorie malnutrition, severe ([place]) 10/02/2014  . Restless legs syndrome (RLS)   . Shingles   . Stricture and stenosis of esophagus   . Type II diabetes mellitus ([place])   . Unspecified essential hypertension   . Unspecified sleep apnea    hx of sleep apnea, none since weight loss (06/12/2015)  . Urinary frequency   . Urinary urgency     Past Surgical History:  Procedure Laterality Date  . BACK SURGERY    . CATARACT EXTRACTION, BILATERAL Bilateral   . CLOSED REDUCTION NASAL FRACTURE  08/2008   Archie Endo 04/10/2011  . COLON SURGERY  June 17, 2012   surgery for bleed  . COLONOSCOPY N/A 06/13/2015   Procedure: COLONOSCOPY;  Surgeon: Irene Shipper, MD;  Location: [facility];  Service: Endoscopy;  Laterality: N/A;  . OOPHORECTOMY  1944   not sure which ovary  . POSTERIOR LUMBAR FUSION  01/2009   L4-L5 Archie Endo 04/09/2011  . REDUCTION MAMMAPLASTY Bilateral 1980  . ROTATOR CUFF REPAIR Right   . SHOULDER OPEN ROTATOR CUFF REPAIR  08/04/2012   Procedure: ROTATOR CUFF REPAIR SHOULDER OPEN;  Surgeon: Tobi Bastos, MD;  Location[facility];  Service: Orthopedics;  Laterality: Left;  with Anchors and Graft  . SHOULDER OPEN ROTATOR CUFF REPAIR Right 2006   Archie Endo 04/22/2011  . TONSILLECTOMY     Family History:  Family History  Problem Relation Age of Onset  . [name] Daughter        died age 66  . [name]    Family Psychiatric  History: Unknown Social History:   Social History   Substance and Sexual Activity  Alcohol Use No     Social History   Substance and Sexual Activity  Drug Use No    Social History   Socioeconomic History  . Marital status: Widowed    Spouse name: None  . Number of children: 2  . Years of education: None  . Highest education level: None  Social Needs  . Financial resource strain: None  . Food insecurity - worry: None  . Food insecurity - inability: None  . Transportation needs - medical: None  . Transportation needs - non-medical: None  Occupational History  . Occupation: Retired  Tobacco Use  . Smoking status: Never Smoker  . Smokeless tobacco: Never Used  Substance and Sexual Activity  . Alcohol use: No  . Drug use: No  . Sexual activity: No  Other Topics Concern  . None  Social History Narrative  . None   Additional Social History: N/A    Allergies:   Allergies  Allergen Reactions  . Sulfa Antibiotics Nausea And Vomiting  . Penicillins Hives and Rash    ++  tolerates Ceftriaxone and keflex++ Has patient had a PCN reaction causing immediate rash, facial/tongue/throat swelling, SOB or lightheadedness with hypotension: Unknown Has patient had a PCN reaction causing severe rash involving mucus membranes or skin necrosis: Unknown Has patient had a PCN reaction that required hospitalization Unknown Has patient had a PCN reaction occurring within the last 10 years: No If all of the above answers are "NO", then may proceed with Cephalosporin use.     Labs:  Results for orders placed or performed during the hospital encounter of 12/24/17 (from the past 48 hour(s))  Basic metabolic panel     Status: Abnormal   Collection Time: 12/24/17  5:22 PM  Result Value Ref Range   Sodium 139 135 - 145 mmol/L   Potassium 4.1 3.5 - 5.1 mmol/L   Chloride 106 101 - 111 mmol/L   CO2 26 22 - 32 mmol/L   Glucose, Bld 127 (H) 65 - 99 mg/dL   BUN 29 (H) 6 - 20 mg/dL   Creatinine, Ser 1.41 (H) 0.44 - 1.00 mg/dL    Calcium 9.2 8.9 - 10.3 mg/dL   GFR calc non Af Amer 30 (L) >60 mL/min   GFR calc Af Amer 35 (L) >60 mL/min    Comment: (NOTE) The eGFR has been calculated using the CKD EPI equation. This calculation has not been validated in all clinical situations. eGFR's persistently <60 mL/min signify possible Chronic Kidney Disease.    Anion gap 7 5 - 15  CBC with Differential     Status: Abnormal   Collection Time: 12/24/17  5:22 PM  Result Value Ref Range   WBC 6.6 4.0 - 10.5 K/uL   RBC 3.87 3.87 - 5.11 MIL/uL   Hemoglobin 11.5 (L) 12.0 - 15.0 g/dL   HCT 35.3 (L) 36.0 - 46.0 %   MCV 91.2 78.0 - 100.0 fL   MCH 29.7 26.0 - 34.0 pg   MCHC 32.6 30.0 - 36.0 g/dL   RDW 13.8 11.5 - 15.5 %   Platelets 215 150 - 400 K/uL   Neutrophils Relative % 48 %   Neutro Abs 3.2 1.7 - 7.7 K/uL   Lymphocytes Relative 39 %   Lymphs Abs 2.6 0.7 - 4.0 K/uL   Monocytes Relative 8 %   Monocytes Absolute 0.5 0.1 - 1.0 K/uL   Eosinophils Relative 4 %   Eosinophils Absolute 0.3 0.0 - 0.7 K/uL   Basophils Relative 1 %   Basophils Absolute 0.0 0.0 - 0.1 K/uL  Urinalysis, Routine w reflex microscopic     Status: Abnormal   Collection Time: 12/24/17  9:46 PM  Result Value Ref Range   Color, Urine YELLOW YELLOW   APPearance CLEAR CLEAR   Specific Gravity, Urine >1.030 (H) 1.005 - 1.030   pH 5.5 5.0 - 8.0   Glucose, UA NEGATIVE NEGATIVE mg/dL   Hgb urine dipstick NEGATIVE NEGATIVE   Bilirubin Urine SMALL (A) NEGATIVE   Ketones, ur NEGATIVE NEGATIVE mg/dL   Protein, ur NEGATIVE NEGATIVE mg/dL   Nitrite NEGATIVE NEGATIVE   Leukocytes, UA NEGATIVE NEGATIVE    Comment: Microscopic not done on urines with negative protein, blood, leukocytes, nitrite, or glucose < 500 mg/dL.    No current facility-administered medications for this encounter.    Current Outpatient Medications  Medication Sig Dispense Refill  . apixaban (ELIQUIS) 2.5 MG TABS tablet Take 1 tablet (2.5 mg total) by mouth 2 (two) times daily. 60 tablet  0  . furosemide (LASIX) 20 MG tablet Take 1  tablet (20 mg total) by mouth every other day as needed for fluid or edema. 30 tablet 2  . pantoprazole (PROTONIX) 40 MG tablet Take 1 tablet (40 mg total) by mouth daily. 30 tablet 0  . polyvinyl alcohol (LIQUIFILM TEARS) 1.4 % ophthalmic solution Place 1 drop into both eyes as needed for dry eyes. 15 mL 0  . ropinirole (REQUIP) 5 MG tablet Take 1 tablet (5 mg total) by mouth at bedtime. 30 tablet 1  . triamcinolone ointment (KENALOG) 0.5 % Apply 1 application topically 2 (two) times daily as needed (irritation,rash). 30 g 0    Musculoskeletal: Strength & Muscle Tone: within normal limits Gait & Station: normal Patient leans: N/A  Psychiatric Specialty Exam: Physical Exam  Nursing note and vitals reviewed. Constitutional: She appears well-developed and well-nourished.  HENT:  Head: Normocephalic.  Neck: Normal range of motion.  Respiratory: Effort normal.  Musculoskeletal: Normal range of motion.  Neurological: She is alert.  Psychiatric: She has a normal mood and affect. Her speech is normal. Thought content normal. She is agitated. She expresses impulsivity. She exhibits abnormal recent memory and abnormal remote memory.    Review of Systems  Psychiatric/Behavioral: Positive for depression and memory loss. Negative for hallucinations, substance abuse and suicidal ideas. The patient is not nervous/anxious and does not have insomnia.   All other systems reviewed and are negative.   Blood pressure (!) 166/94, pulse 66, temperature (!) 97.5 F (36.4 C), temperature source Oral, resp. rate 18, height 5' (1.524 m), weight 49.9 kg (110 lb), SpO2 100 %.Body mass index is 21.48 kg/m.  General Appearance: Disheveled  Eye Contact:  Good  Speech:  Clear and Coherent  Volume:  Normal  Mood:  Euthymic  Affect:  Congruent  Thought Process:  Coherent  Orientation:  Other:  Oriented to self.  Thought Content:  Logical  Suicidal Thoughts:  No   Homicidal Thoughts:  No  Memory:  Immediate;   Fair Recent;   Fair Remote;   Fair  Judgement:  Poor  Insight:  Poor  Psychomotor Activity:  Normal  Concentration:  Concentration: Fair and Attention Span: Fair  Recall:  Poor  Fund of Knowledge:  Poor  Language:  Good  Akathisia:  No  Handed:  Right  AIMS (if indicated):   N/A  Assets:  Agricultural consultant Housing  ADL's:  Intact  Cognition:  Impaired  Sleep:   N/A     Treatment Plan Summary: Plan Hallucinations Discharge Home Take all medications as prescribed Follow up with your PCP for any medical issues  Disposition: No evidence of imminent risk to self or others at present.   Patient does not meet criteria for psychiatric inpatient admission. Supportive therapy provided about ongoing stressors.  Ethelene Hal, NP 12/25/2017 11:56 AM   Patient seen face-to-face for psychiatric evaluation, chart reviewed and case discussed with the physician extender and developed treatment plan. Reviewed the information documented and agree with the treatment plan.  Buford Dresser, DO

## 2017-12-25 NOTE — BH Assessment (Signed)
[facility] Assessment Progress Note  Per Buford Dresser, DO, this pt does not require psychiatric hospitalization at this time.  Pt is to be discharged from [facility] to return to her residence.  Blima Dessert, LCSW has been contacted to assist with facilitating this.  No discharge referrals are required.  Pt's nurse, Erline Levine, has been notified.  Jalene Mullet, [place] Triage Specialist [phone]

## 2017-12-25 NOTE — Progress Notes (Signed)
CSW made aware that RN doesn't feel safe sending pt home alone. CSW reached out to [place] listed in pt's chart and asked that she come pick pt up. Ms. [name]  agreeable to this and mentioned that she would be at [facility] [place] to get pt around 2pm today. There are no further CSW needs. CSW signing off at this time.    Virgie Dad Neira Bentsen, MSW, [facility] Clinical Social Worker [phone]

## 2017-12-25 NOTE — Progress Notes (Signed)
CSW received call informing CSW that pt is ready to discharge back to pt's residence. CSW has updated RN Erline Levine) of this at this time. CSW reached out to granddaughter [name] in chart to inform her that pt is returning home. No answer. [facility] to call and set up transport as needed. No further CSW needs. CSW signing off.    [name], MSW, [facility] Clinical Social Worker [phone]

## 2017-12-25 NOTE — ED Provider Notes (Signed)
[facility] DEPT Provider Note   CSN: [id number] Arrival date & time: 12/24/17  1357     History   Chief Complaint Chief Complaint  Patient presents with  . Hallucinations    HPI AANSHI Church is a 84 y.o. female.  HPI Patient presents to the emergency department with hallucinations that occurred today.  The patient states that she self 3 cats on the top of her kitchen cabinets.  The patient states that she also saw some flowers that were not there on her table.  Patient states that she knows that these things were present.  She states that the home health nurse sent her to be evaluated.  Patient is concerned that she is going to lose her mind.  The patient denies chest pain, shortness of breath, headache,blurred vision, neck pain, fever, cough, weakness, numbness, dizziness, anorexia, edema, abdominal pain, nausea, vomiting, diarrhea, rash, back pain, dysuria, hematemesis, bloody stool, near syncope, or syncope. Past Medical History:  Diagnosis Date  . Acute gastritis without mention of hemorrhage   . Anemia   . Anxiety   . Arthritis    "LLL; palm of right hand" (06/12/2015)  . Atrial fibrillation ([place])   . CAP (community acquired pneumonia) 04/03/2014  . Crohn disease ([place])   . Cyst and pseudocyst of pancreas   . Depression   . Diaphragmatic hernia without mention of obstruction or gangrene   . Diverticulosis of colon (without mention of hemorrhage)   . Duodenitis without mention of hemorrhage   . DVT (deep venous thrombosis) ([place])    "twice on the left leg; once on the right leg" (06/12/2015)  . Dysrhythmia   . Esophageal reflux   . Hiatal hernia- moderate to large 10/06/2014  . Hx pulmonary embolism   . Hypothyroidism   . Insomnia, unspecified   . Left leg DVT ([place]) jan 2013  . Migraine    hx  . OAB (overactive bladder)   . Other and unspecified hyperlipidemia   . Other malaise and fatigue   . Pancreas divisum 03/02/2015  . Pancreatic  cyst   . Personal history of unspecified digestive disease   . Polymyalgia rheumatica ([place])   . Protein-calorie malnutrition, severe ([place]) 10/02/2014  . Restless legs syndrome (RLS)   . Shingles   . Stricture and stenosis of esophagus   . Type II diabetes mellitus ([place])   . Unspecified essential hypertension   . Unspecified sleep apnea    hx of sleep apnea, none since weight loss (06/12/2015)  . Urinary frequency   . Urinary urgency     Patient Active Problem List   Diagnosis Date Noted  . DNR (do not resuscitate) 12/12/2017  . CKD (chronic kidney disease), stage III ([place]) 11/22/2017  . Hypertension 11/22/2017  . DVT (deep vein thrombosis) in pregnancy ([place]) 11/22/2017  . Atrial fibrillation, chronic ([place]) 11/22/2017  . Depression 11/22/2017  . Protein-calorie malnutrition, moderate ([place]) 11/22/2017  . Dry skin dermatitis 09/24/2017  . Osteoarthritis 07/31/2017  . CKD stage 4 due to type 2 diabetes mellitus ([place]) 04/19/2017  . Iron deficiency anemia 04/19/2017  . B12 deficiency 04/08/2017  . Chronic constipation 04/08/2017  . Chronic deep vein thrombosis (DVT) of left lower extremity ([place]) 04/08/2017  . Episode of recurrent major depressive disorder ([place]) 04/08/2017  . Monoclonal gammopathy of unknown significance (MGUS) 04/08/2017  . Peripheral neuropathy 04/08/2017  . Rectal mass   . Abdominal pain 06/12/2015  . UTI (urinary tract infection) 03/02/2015  . Pancreas divisum  03/02/2015  . Physical deconditioning 03/01/2015  . Nausea vomiting and diarrhea   . Pancreatic cyst   . Hiatal hernia- moderate to large 10/06/2014  . Heme + stool 10/04/2014  . Crohn's ileocolitis ([place]) 10/04/2014  . 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 Arthritis   . Hx pulmonary embolism   . Hypothyroidism 04/11/2014  . Type 2 diabetes mellitus with stage 3 chronic kidney disease ([place]) 04/11/2014  . Anemia of chronic disease 04/03/2014  . OAB (overactive bladder) 01/07/2012  . Restless leg syndrome  03/09/2008  . Benign essential hypertension 03/09/2008  . ESOPHAGEAL STRICTURE 03/09/2008  . GERD (gastroesophageal reflux disease) 03/09/2008  . Diverticulosis of large intestine 03/09/2008  . Polymyalgia rheumatica ([place]) 03/09/2008    Past Surgical History:  Procedure Laterality Date  . BACK SURGERY    . CATARACT EXTRACTION, BILATERAL Bilateral   . CLOSED REDUCTION NASAL FRACTURE  08/2008   Archie Endo 04/10/2011  . COLON SURGERY  June 17, 2012   surgery for bleed  . COLONOSCOPY N/A 06/13/2015   Procedure: COLONOSCOPY;  Surgeon: Irene Shipper, MD;  Location: [facility];  Service: Endoscopy;  Laterality: N/A;  . OOPHORECTOMY  1944   not sure which ovary  . POSTERIOR LUMBAR FUSION  01/2009   L4-L5 Archie Endo 04/09/2011  . REDUCTION MAMMAPLASTY Bilateral 1980  . ROTATOR CUFF REPAIR Right   . SHOULDER OPEN ROTATOR CUFF REPAIR  08/04/2012   Procedure: ROTATOR CUFF REPAIR SHOULDER OPEN;  Surgeon: Tobi Bastos, MD;  [facility];  Service: Orthopedics;  Laterality: Left;  with Anchors and Graft  . SHOULDER OPEN ROTATOR CUFF REPAIR Right 2006   Archie Endo 04/22/2011  . TONSILLECTOMY      OB History    No data available       Home Medications    Prior to Admission medications   Medication Sig Start Date End Date Taking? Authorizing Provider  apixaban (ELIQUIS) 2.5 MG TABS tablet Take 1 tablet (2.5 mg total) by mouth 2 (two) times daily. 12/14/17  Yes Briscoe Deutscher, DO  furosemide (LASIX) 20 MG tablet Take 1 tablet (20 mg total) by mouth every other day as needed for fluid or edema. 12/11/17   Briscoe Deutscher, DO  pantoprazole (PROTONIX) 40 MG tablet Take 1 tablet (40 mg total) by mouth daily. 12/10/17   Briscoe Deutscher, DO  polyvinyl alcohol (LIQUIFILM TEARS) 1.4 % ophthalmic solution Place 1 drop into both eyes as needed for dry eyes. 11/24/17   Florencia Reasons, MD  ropinirole (REQUIP) 5 MG tablet Take 1 tablet (5 mg total) by mouth at bedtime. 12/14/17   Briscoe Deutscher, DO  triamcinolone ointment  (KENALOG) 0.5 % Apply 1 application topically 2 (two) times daily as needed (irritation,rash). 12/11/17   Briscoe Deutscher, DO    Family History Family History  Problem Relation Age of Onset  . Melanoma Daughter        died age 82  . Melanoma Son     Social History Social History   Tobacco Use  . Smoking status: Never Smoker  . Smokeless tobacco: Never Used  Substance Use Topics  . Alcohol use: No  . Drug use: No     Allergies   Sulfa antibiotics and Penicillins   Review of Systems Review of Systems All other systems negative except as documented in the HPI. All pertinent positives and negatives as reviewed in the HPI.  Physical Exam Updated Vital Signs BP (!) 153/75   Pulse 61   Temp (!) 97.4 F (36.3 C) (  Oral)   Resp 16   Ht 5' (1.524 m)   Wt 49.9 kg (110 lb)   SpO2 97%   BMI 21.48 kg/m    Physical Exam  Constitutional: She is oriented to person, place, and time. She appears well-developed and well-nourished. No distress.  HENT:  Head: Normocephalic and atraumatic.  Mouth/Throat: Oropharynx is clear and moist.  Eyes: Pupils are equal, round, and reactive to light.  Neck: Normal range of motion. Neck supple.  Cardiovascular: Normal rate, regular rhythm and normal heart sounds. Exam reveals no gallop and no friction rub.  No murmur heard. Pulmonary/Chest: Effort normal and breath sounds normal. No respiratory distress. She has no wheezes.  Neurological: She is alert and oriented to person, place, and time. She exhibits normal muscle tone. Coordination normal.  Skin: Skin is warm and dry. Capillary refill takes less than 2 seconds. No rash noted. No erythema.  Psychiatric: She has a normal mood and affect. Her behavior is normal. She is actively hallucinating.  Nursing note and vitals reviewed.    ED Treatments / Results  Labs (all labs ordered are listed, but only abnormal results are displayed) Labs Reviewed  BASIC METABOLIC PANEL - Abnormal; Notable for  the following components:      Result Value   Glucose, Bld 127 (*)    BUN 29 (*)    Creatinine, Ser 1.41 (*)    GFR calc non Af Amer 30 (*)    GFR calc Af Amer 35 (*)    All other components within normal limits  CBC WITH DIFFERENTIAL/PLATELET - Abnormal; Notable for the following components:   Hemoglobin 11.5 (*)    HCT 35.3 (*)    All other components within normal limits  URINALYSIS, ROUTINE W REFLEX MICROSCOPIC - Abnormal; Notable for the following components:   Specific Gravity, Urine >1.030 (*)    Bilirubin Urine SMALL (*)    All other components within normal limits  URINE CULTURE    EKG  EKG Interpretation None       Radiology No results found.  Procedures Procedures (including critical care time)  Medications Ordered in ED Medications  sodium chloride 0.9 % bolus 500 mL (0 mLs Intravenous Stopped 12/24/17 2235)     Initial Impression / Assessment and Plan / ED Course  I have reviewed the triage vital signs and the nursing notes.  Pertinent labs & imaging results that were available during my care of the patient were reviewed by me and considered in my medical decision making (see chart for details).     Patient still currently hallucinating in her room.  We will have her assessed by TTS.  Patient most likely will need inpatient care for her hallucinations.  I feel that these are multifactorial.  Final Clinical Impressions(s) / ED Diagnoses   Final diagnoses:  None    ED Discharge Orders    None      Dalia Heading, PA-C 12/25/17 0028  Macarthur Critchley, MD 12/25/17 [phone]

## 2017-12-25 NOTE — Telephone Encounter (Signed)
Spoke with Ms. Pace per Dr. Juleen China to let her know that we are aware of Ms. Talerico is in the hospital. She was very upset that I could not speak with her about much stuff due to her not being on the [facility]. She stated that She stated that she had brought the paper up here to be put in. I apologized and said that I did not know what was going on. She is going to bring another form up here.

## 2017-12-25 NOTE — Care Management Note (Addendum)
Case Management Note  Patient Details  Name: [name] MRN: [id number] Date of Birth: 24-Jun-1919  CM noted pt's return to the ED and plan that pt will be transitioned back home today.  CM contacted Lattie Haw with [facility] and advised her of the situation.  CM requested that pt likely needs to been seen by the [facility] team today even though she was seen in the ED today due to ongoing hallucinations.  Updated primary RN and Ward team that pt has HHS and PCS in place.  No further CM needs noted at this time.  Expected Discharge Date:    12/25/2017              Expected Discharge Plan:  Home/Self   Post Acute Care Choice:  Home Health Choice offered to:  Patient, [facility] POA / Guardian  HH Arranged:  RN, PT, OT, Nurse's Aide, Social Work [employer] Agency:  [facility] [employer] [place] through [place] of Service: completed, signed off  Allora Bains, Benjaman Lobe, RN 12/25/2017, 11:22 AM

## 2017-12-25 NOTE — BHH Suicide Risk Assessment (Signed)
Suicide Risk Assessment  Discharge Assessment   [facility] Discharge Suicide Risk Assessment   Principal Problem: Auditory hallucinations Discharge Diagnoses:  Patient Active Problem List   Diagnosis Date Noted  . Auditory hallucinations [R44.0] 12/25/2017  . DNR (do not resuscitate) [Z66] 12/12/2017  . CKD (chronic kidney disease), stage III ([place]) [N18.3] 11/22/2017  . Hypertension [I10] 11/22/2017  . DVT (deep vein thrombosis) in pregnancy ([place]) [O22.30, I82.409] 11/22/2017  . Atrial fibrillation, chronic ([place]) [I48.2] 11/22/2017  . Depression [F32.9] 11/22/2017  . Protein-calorie malnutrition, moderate ([place]) [E44.0] 11/22/2017  . Dry skin dermatitis [L85.3] 09/24/2017  . Osteoarthritis [M19.90] 07/31/2017  . CKD stage 4 due to type 2 diabetes mellitus ([place]) [C58.85, N18.4] 04/19/2017  . Iron deficiency anemia [D50.9] 04/19/2017  . B12 deficiency [E53.8] 04/08/2017  . Chronic constipation [K59.09] 04/08/2017  . Chronic deep vein thrombosis (DVT) of left lower extremity ([place]) [I82.502] 04/08/2017  . Episode of recurrent major depressive disorder ([place]) [F33.9] 04/08/2017  . Monoclonal gammopathy of unknown significance (MGUS) [D47.2] 04/08/2017  . Peripheral neuropathy [G62.9] 04/08/2017  . Rectal mass [K62.9]   . Abdominal pain [R10.9] 06/12/2015  . UTI (urinary tract infection) [N39.0] 03/02/2015  . Pancreas divisum [Q45.3] 03/02/2015  . Physical deconditioning [R53.81] 03/01/2015  . Nausea vomiting and diarrhea [R11.2, R19.7]   . Pancreatic cyst [K86.2]   . Hiatal hernia- moderate to large [K44.9] 10/06/2014  . Heme + stool [R19.5] 10/04/2014  . Crohn's ileocolitis ([place]) [K50.80] 10/04/2014  . 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 Arthritis [M19.90]   . Hx pulmonary embolism [Z86.711]   . Hypothyroidism [E03.9] 04/11/2014  . Type 2 diabetes mellitus with stage 3 chronic kidney disease ([place]) [O27.74, N18.3] 04/11/2014  . Anemia of chronic disease [D63.8] 04/03/2014  . OAB  (overactive bladder) [N32.81] 01/07/2012  . Restless leg syndrome [G25.81] 03/09/2008  . Benign essential hypertension [I10] 03/09/2008  . ESOPHAGEAL STRICTURE [K22.2] 03/09/2008  . GERD (gastroesophageal reflux disease) [K21.9] 03/09/2008  . Diverticulosis of large intestine [K57.30] 03/09/2008  . Polymyalgia rheumatica (HCC) [M35.3] 03/09/2008    Total Time spent with patient: 45 minutes  Musculoskeletal: Strength & Muscle Tone: within normal limits Gait & Station: normal Patient leans: N/A  A  Psychiatric Specialty Exam: Physical Exam  Constitutional: She appears well-developed and well-nourished.  HENT:  Head: Normocephalic.  Respiratory: Effort normal.  Musculoskeletal: Normal range of motion.  Neurological: She is alert.  Psychiatric: Her speech is normal. Thought content normal. Her mood appears anxious. She is agitated. She expresses impulsivity. She exhibits a depressed mood. She exhibits abnormal recent memory and abnormal remote memory.   Review of Systems  Psychiatric/Behavioral: Positive for depression and memory loss. Negative for hallucinations, substance abuse and suicidal ideas. The patient is not nervous/anxious and does not have insomnia.   All other systems reviewed and are negative.  Blood pressure (!) 150/55, pulse (!) 56, temperature 98.5 F (36.9 C), temperature source Oral, resp. rate 18, height 5' (1.524 m), weight 49.9 kg (110 lb), SpO2 99 %.Body mass index is 21.48 kg/m. General Appearance: Disheveled Eye Contact:  Good Speech:  Clear and Coherent Volume:  Normal Mood:  Euthymic Affect:  Congruent Thought Process:  Coherent Orientation:  Full (Time, Place, and Person) Thought Content:  Logical Suicidal Thoughts:  No Homicidal Thoughts:  No Memory:  Immediate;   Fair Recent;   Fair Remote;   Fair Judgement:  Fair Insight:  Fair Psychomotor Activity:  Normal Concentration:  Concentration: Fair and Attention Span: Fair Recall:  [employer]  of Knowledge:  Fair Language:  Good Akathisia:  No Handed:  Right AIMS (if indicated):    Assets:  Agricultural consultant Housing ADL's:  Intact Cognition:  WNL   Mental Status Per Nursing Assessment::   On Admission:   Auditory and visual hallucinations  Demographic Factors:  Age 84 or older and Caucasian  Loss Factors: Decline in physical health  Historical Factors: Impulsivity  Risk Reduction Factors:   Sense of responsibility to family and Positive social support  Continued Clinical Symptoms:  Depression:   Impulsivity  Cognitive Features That Contribute To Risk:  Loss of executive function    Suicide Risk:  Minimal: No identifiable suicidal ideation.  Patients presenting with no risk factors but with morbid ruminations; may be classified as minimal risk based on the severity of the depressive symptoms  Follow-up Information    [facility]   Specialty:  [facility] Contact information: [place] [address] [phone]           Plan Of Care/Follow-up recommendations:  Activity:  as tolerated Diet:  Heart Healthy  Ethelene Hal, NP 12/25/2017, 11:57 AM

## 2017-12-25 NOTE — Telephone Encounter (Signed)
Please advise 

## 2017-12-26 LAB — URINE CULTURE

## 2017-12-28 ENCOUNTER — Ambulatory Visit: Payer: Medicare Other | Admitting: Family Medicine

## 2017-12-28 ENCOUNTER — Encounter: Payer: Self-pay | Admitting: Family Medicine

## 2017-12-28 ENCOUNTER — Other Ambulatory Visit: Payer: Medicare Other

## 2017-12-28 VITALS — BP 104/66 | HR 93 | Ht 61.0 in | Wt 115.6 lb

## 2017-12-28 DIAGNOSIS — F329 Major depressive disorder, single episode, unspecified: Secondary | ICD-10-CM | POA: Diagnosis not present

## 2017-12-28 DIAGNOSIS — K219 Gastro-esophageal reflux disease without esophagitis: Secondary | ICD-10-CM

## 2017-12-28 DIAGNOSIS — N183 Chronic kidney disease, stage 3 unspecified: Secondary | ICD-10-CM

## 2017-12-28 DIAGNOSIS — Z742 Need for assistance at home and no other household member able to render care: Secondary | ICD-10-CM | POA: Diagnosis not present

## 2017-12-28 MED ORDER — MIRTAZAPINE 15 MG PO TABS: ORAL_TABLET | ORAL | 1 refills | Status: DC

## 2017-12-28 MED ORDER — PANTOPRAZOLE SODIUM 40 MG PO TBEC: 40.0000 mg | DELAYED_RELEASE_TABLET | Freq: Every day | ORAL | 5 refills | Status: DC

## 2017-12-28 NOTE — Progress Notes (Signed)
[name] is a 84 y.o. female is here for follow up.  History of Present Illness:  Shaune Pascal CMA acting as scribe for Dr. Juleen China.  HPI: Patient comes in today for follow up.   Dr. Juleen China started off the conversation. She started off by stating that they have multiple people from the community to help the patient. Since the last meeting we have been getting several calls daily. She is falling out of the chair and out of the bed. The concern is that the patinet is not getting enough one on one time that may be needed. Discussed with the patient that she is going to need 24 hour care. The people that are taking care of her may not be there due to vacations. Resources are limited at this time so not sure that the patient can have 24 hours. [facility] is coming out, but not sure how frequent due to patient declining some visits. Patient stated that she wants to stay at home. She was tearful when discussing this. Dr. Juleen China stated that she has gotten calls about patient being depressed. Dr. Juleen China stated that she does not believe that all of her problem is not due to dementia. We want to make sure that she is taking care of patient. POA stated that the finances are very limited. He has looked into medicaid, but the patient makes to much for county resources. Discussed [facility] coming to evaluate. POA has also reached out to [facility] to discuss long term service. Dr. Juleen China is going to prescribe some for depression medication to take at night..   Health Maintenance Due  Topic Date Due  . OPHTHALMOLOGY EXAM  07/24/1931  . TETANUS/TDAP  07/23/1940  . DEXA SCAN  07/23/1986  . PNA vac Low Risk Adult (1 of 2 - PCV13) 07/23/1986   Depression screen PHQ 2/9 11/03/2017  Decreased Interest 0  Down, Depressed, Hopeless 0  PHQ - 2 Score 0   PMHx, SurgHx, SocialHx, FamHx, Medications, and Allergies were reviewed in the Visit Navigator and updated as appropriate.   Patient Active Problem List   Diagnosis Date Noted  . Auditory hallucinations 12/25/2017  . Altered mental status   . DNR (do not resuscitate) 12/12/2017  . CKD (chronic kidney disease), stage III ([place]) 11/22/2017  . Hypertension 11/22/2017  . DVT (deep vein thrombosis) in pregnancy ([place]) 11/22/2017  . Atrial fibrillation, chronic ([place]) 11/22/2017  . Depression 11/22/2017  . Protein-calorie malnutrition, moderate ([place]) 11/22/2017  . Dry skin dermatitis 09/24/2017  . Osteoarthritis 07/31/2017  . CKD stage 4 due to type 2 diabetes mellitus ([place]) 04/19/2017  . Iron deficiency anemia 04/19/2017  . B12 deficiency 04/08/2017  . Chronic constipation 04/08/2017  . Chronic deep vein thrombosis (DVT) of left lower extremity ([place]) 04/08/2017  . Episode of recurrent major depressive disorder ([place]) 04/08/2017  . Monoclonal gammopathy of unknown significance (MGUS) 04/08/2017  . Peripheral neuropathy 04/08/2017  . Rectal mass   . Abdominal pain 06/12/2015  . UTI (urinary tract infection) 03/02/2015  . Pancreas divisum 03/02/2015  . Physical deconditioning 03/01/2015  . Nausea vomiting and diarrhea   . Pancreatic cyst   . Hiatal hernia- moderate to large 10/06/2014  . Heme + stool 10/04/2014  . Crohn's ileocolitis ([place]) 10/04/2014  . 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 Arthritis   . Hx pulmonary embolism   . Hypothyroidism 04/11/2014  . Type 2 diabetes mellitus with stage 3 chronic kidney disease ([place]) 04/11/2014  . Anemia of chronic disease 04/03/2014  .  OAB (overactive bladder) 01/07/2012  . Restless leg syndrome 03/09/2008  . Benign essential hypertension 03/09/2008  . ESOPHAGEAL STRICTURE 03/09/2008  . GERD (gastroesophageal reflux disease) 03/09/2008  . Diverticulosis of large intestine 03/09/2008  . Polymyalgia rheumatica ([place]) 03/09/2008   Social History   Tobacco Use  . Smoking status: Never Smoker  . Smokeless tobacco: Never Used  Substance Use Topics  . Alcohol use: No  . Drug use: No   Current Medications  and Allergies:   Current Outpatient Medications:  .  apixaban (ELIQUIS) 2.5 MG TABS tablet, Take 1 tablet (2.5 mg total) by mouth 2 (two) times daily., Disp: 60 tablet, Rfl: 0 .  furosemide (LASIX) 20 MG tablet, Take 1 tablet (20 mg total) by mouth every other day as needed for fluid or edema., Disp: 30 tablet, Rfl: 2 .  pantoprazole (PROTONIX) 40 MG tablet, Take 1 tablet (40 mg total) by mouth daily., Disp: 30 tablet, Rfl: 0 .  polyvinyl alcohol (LIQUIFILM TEARS) 1.4 % ophthalmic solution, Place 1 drop into both eyes as needed for dry eyes., Disp: 15 mL, Rfl: 0 .  ropinirole (REQUIP) 5 MG tablet, Take 1 tablet (5 mg total) by mouth at bedtime., Disp: 30 tablet, Rfl: 1 .  triamcinolone ointment (KENALOG) 0.5 %, Apply 1 application topically 2 (two) times daily as needed (irritation,rash)., Disp: 30 g, Rfl: 0  Allergies  Allergen Reactions  . Sulfa Antibiotics Nausea And Vomiting  . Penicillins Hives and Rash    ++tolerates Ceftriaxone and keflex++ Has patient had a PCN reaction causing immediate rash, facial/tongue/throat swelling, SOB or lightheadedness with hypotension: Unknown Has patient had a PCN reaction causing severe rash involving mucus membranes or skin necrosis: Unknown Has patient had a PCN reaction that required hospitalization Unknown Has patient had a PCN reaction occurring within the last 10 years: No If all of the above answers are "NO", then may proceed with Cephalosporin use.    Review of Systems   Pertinent items are noted in the HPI. Otherwise, ROS is negative.  Vitals:   Vitals:   12/28/17 1013  BP: 104/66  Pulse: 93  SpO2: 98%  Weight: 115 lb 9.6 oz (52.4 kg)  Height: 5' 1"  (1.549 m)     Body mass index is 21.84 kg/m.  Physical Exam:   Physical Exam  Constitutional: She appears well-nourished.  HENT:  Head: Normocephalic and atraumatic.  Eyes: EOM are normal. Pupils are equal, round, and reactive to light.  Neck: Normal range of motion. Neck  supple.  Cardiovascular: Normal rate, normal heart sounds and intact distal pulses. An irregular rhythm present.  Pulmonary/Chest: Effort normal.  Abdominal: Soft.  Skin: Skin is warm.  Psychiatric: She has a normal mood and affect. Her behavior is normal.  Nursing note and vitals reviewed.  Assessment and Plan:   1. Gastroesophageal reflux disease, esophagitis presence not specified Refill today.  - pantoprazole (PROTONIX) 40 MG tablet; Take 1 tablet (40 mg total) by mouth daily.  Dispense: 30 tablet; Refill: 5  2. CKD (chronic kidney disease), stage III (HCC) Plan: glycemic control, blood pressure control, avoid NSAIDs, consult physician early if nausea, vomiting, or diarrhea and avoid IV contrast.    3. Reactive depression After discussion, patient would like to start below medication. Expectations, risks, and potential side effects reviewed.   - mirtazapine (REMERON) 15 MG tablet; Take half tablet at bedtime.  Dispense: 30 tablet; Refill: 1  4. Needs assistance while at home 30 minute discussion as above. Will work on  24/7 care for patient.   . Reviewed expectations re: course of current medical issues. . Discussed self-management of symptoms. . Outlined signs and symptoms indicating need for more acute intervention. . Patient verbalized understanding and all questions were answered. Marland Kitchen Health Maintenance issues including appropriate healthy diet, exercise, and smoking avoidance were discussed with patient. . See orders for this visit as documented in the electronic medical record. . Patient received an After Visit Summary.  CMA served as Education administrator during this visit. History, Physical, and Plan performed by medical provider. The above documentation has been reviewed and is accurate and complete. Briscoe Deutscher, D.O.   Briscoe Deutscher, DO [place], [facility] 12/29/2017

## 2017-12-30 ENCOUNTER — Telehealth: Payer: Self-pay | Admitting: Family Medicine

## 2017-12-30 NOTE — Telephone Encounter (Signed)
Copied from [place]. Topic: Quick Communication - See Telephone Encounter >> Dec 30, 2017  9:25 AM Robina Ade, Helene Kelp D wrote: CRM for notification. See Telephone encounter for: 12/30/17. Olean Ree from [facility] called and would like a verbal order for patient. She would like to continue skill nursing for 1X for 2 weeks for medication and disease management. She can be reached at [phone].

## 2017-12-30 NOTE — Telephone Encounter (Signed)
Spoke with Arbie Cookey the nurse and explained that everyone is working on 24 hour care for the patient. She stated that they do not offer this. She said that patient is due for new certification next week. Nurse said that they can add for the nurse to come out two times a week. I did not give verbal until approved by Dr. Juleen China.

## 2017-12-30 NOTE — Telephone Encounter (Signed)
See note

## 2017-12-30 NOTE — Telephone Encounter (Signed)
Is this OK?

## 2017-12-30 NOTE — Telephone Encounter (Signed)
Before approval, I would like info from a case manager re: 24/7 care. Let them know.

## 2017-12-31 ENCOUNTER — Telehealth: Payer: Self-pay | Admitting: Family Medicine

## 2017-12-31 NOTE — Telephone Encounter (Signed)
24 hour care or will need placement.

## 2017-12-31 NOTE — Telephone Encounter (Signed)
Ok to give orders  °

## 2017-12-31 NOTE — Telephone Encounter (Signed)
24 hour care only. If not available, will need placement.

## 2017-12-31 NOTE — Telephone Encounter (Signed)
Left message for Mr. [name] the POA to call back about if he has found out anything from [facility] or [place].

## 2017-12-31 NOTE — Telephone Encounter (Signed)
Spoke with Mr. Delice Bison and he has spoke with Lelon Frohlich at [facility] about Hospice coming to sit with Ms. [name]. I told him I would call Hospice tomorrow to find out what they can offer. He stated that he is trying to get more money from [employer] to help with services.

## 2017-12-31 NOTE — Telephone Encounter (Signed)
Please be advised.  °

## 2017-12-31 NOTE — Telephone Encounter (Signed)
Copied from [place] [phone]. Topic: Quick Communication - See Telephone Encounter >> Dec 31, 2017 11:26 AM Robina Ade, Helene Kelp D wrote: CRM for notification. See Telephone encounter for: 12/31/17. Audrea Muscat with [facility] called and would like to let Dr. Juleen China that the [place] program she wanted patient to get will not work due to she does not qualify for medicaid. Per patient and family they are requesting Hospice care at her home. She feels that she would benefit from their different services like chapel, counseling. She would like to know if provider will be ok with this. Ms. Lelon Frohlich can be reached at [phone], thanks.

## 2018-01-01 ENCOUNTER — Telehealth: Payer: Self-pay | Admitting: Family Medicine

## 2018-01-01 NOTE — Telephone Encounter (Signed)
Copied from [place] [id number]. Topic: General - Other >> Jan 01, 2018  1:38 PM Yvette Rack wrote: Reason for CRM: Amy from [place] [phone] would like to know if Dr Juleen China would be the patients attending provider

## 2018-01-01 NOTE — Telephone Encounter (Signed)
See note

## 2018-01-01 NOTE — Telephone Encounter (Signed)
Please advise 

## 2018-01-01 NOTE — Telephone Encounter (Signed)
Spoke with Stanton Kidney at the [facility] and got the information needed to get the patient in Hospice to have for care. She stated that they can have an Aid come out daily to help with cleaning and stuff. The nurse will come out once a week. They also have a Education officer, museum that will be able to help with any other concerns. They are going to fax over the referral form to get it started. I have also spoke with Lang Snow the POA and he is on board with these steps. He will contact us if he needs anything else. There is no other resources due to the patients income.

## 2018-01-01 NOTE — Telephone Encounter (Unsigned)
Copied from [place] [phone]. Topic: Referral - Request >> Jan 01, 2018  9:27 AM Neva Seat wrote: [name] - [facility] [phone]  - Has faxed over the last 2 visit notes to [facility]. Requesting referral to [facility]ospice Family as well as nurse wants the in home hospice care.  Family as well as pt is refusing all other care.  They want the hospice care at home for pt.

## 2018-01-04 DIAGNOSIS — E46 Unspecified protein-calorie malnutrition: Secondary | ICD-10-CM | POA: Diagnosis not present

## 2018-01-04 DIAGNOSIS — I4891 Unspecified atrial fibrillation: Secondary | ICD-10-CM | POA: Diagnosis not present

## 2018-01-04 DIAGNOSIS — E119 Type 2 diabetes mellitus without complications: Secondary | ICD-10-CM | POA: Diagnosis not present

## 2018-01-04 DIAGNOSIS — G2581 Restless legs syndrome: Secondary | ICD-10-CM | POA: Diagnosis not present

## 2018-01-04 DIAGNOSIS — K219 Gastro-esophageal reflux disease without esophagitis: Secondary | ICD-10-CM | POA: Diagnosis not present

## 2018-01-04 DIAGNOSIS — R609 Edema, unspecified: Secondary | ICD-10-CM | POA: Diagnosis not present

## 2018-01-04 DIAGNOSIS — F339 Major depressive disorder, recurrent, unspecified: Secondary | ICD-10-CM | POA: Diagnosis not present

## 2018-01-04 DIAGNOSIS — F0280 Dementia in other diseases classified elsewhere without behavioral disturbance: Secondary | ICD-10-CM | POA: Diagnosis not present

## 2018-01-04 DIAGNOSIS — D649 Anemia, unspecified: Secondary | ICD-10-CM | POA: Diagnosis not present

## 2018-01-04 DIAGNOSIS — N3289 Other specified disorders of bladder: Secondary | ICD-10-CM | POA: Diagnosis not present

## 2018-01-04 DIAGNOSIS — E039 Hypothyroidism, unspecified: Secondary | ICD-10-CM | POA: Diagnosis not present

## 2018-01-04 DIAGNOSIS — I10 Essential (primary) hypertension: Secondary | ICD-10-CM | POA: Diagnosis not present

## 2018-01-04 DIAGNOSIS — E785 Hyperlipidemia, unspecified: Secondary | ICD-10-CM | POA: Diagnosis not present

## 2018-01-04 DIAGNOSIS — R630 Anorexia: Secondary | ICD-10-CM | POA: Diagnosis not present

## 2018-01-04 DIAGNOSIS — H04123 Dry eye syndrome of bilateral lacrimal glands: Secondary | ICD-10-CM | POA: Diagnosis not present

## 2018-01-04 DIAGNOSIS — I82503 Chronic embolism and thrombosis of unspecified deep veins of lower extremity, bilateral: Secondary | ICD-10-CM | POA: Diagnosis not present

## 2018-01-04 DIAGNOSIS — G619 Inflammatory polyneuropathy, unspecified: Secondary | ICD-10-CM | POA: Diagnosis not present

## 2018-01-04 NOTE — Telephone Encounter (Signed)
Called and gave a verbal.

## 2018-01-04 NOTE — Telephone Encounter (Signed)
Please contact the number provided to advise.  Copied from [place] [phone]. Topic: General - Other >> Jan 01, 2018  1:38 PM Yvette Rack wrote: Reason for CRM: Amy from [place] [phone] would like to know if Dr Juleen China would be the patients attending provider

## 2018-01-04 NOTE — Telephone Encounter (Signed)
Copied from [place] [phone]. Topic: General - Other >> Jan 01, 2018  1:38 PM Yvette Rack wrote: Reason for CRM: Amy from [place] [phone] would like to know if Dr Juleen China would be the patients attending provider  >> Jan 04, 2018  9:00 AM Yvette Rack wrote: Amy from [place] is calling back to see if Dr Juleen China had responded back to her message

## 2018-01-05 DIAGNOSIS — F0280 Dementia in other diseases classified elsewhere without behavioral disturbance: Secondary | ICD-10-CM | POA: Diagnosis not present

## 2018-01-05 DIAGNOSIS — E119 Type 2 diabetes mellitus without complications: Secondary | ICD-10-CM | POA: Diagnosis not present

## 2018-01-05 DIAGNOSIS — E46 Unspecified protein-calorie malnutrition: Secondary | ICD-10-CM | POA: Diagnosis not present

## 2018-01-05 DIAGNOSIS — I10 Essential (primary) hypertension: Secondary | ICD-10-CM | POA: Diagnosis not present

## 2018-01-05 DIAGNOSIS — R630 Anorexia: Secondary | ICD-10-CM | POA: Diagnosis not present

## 2018-01-05 DIAGNOSIS — I4891 Unspecified atrial fibrillation: Secondary | ICD-10-CM | POA: Diagnosis not present

## 2018-01-06 ENCOUNTER — Telehealth: Payer: Self-pay | Admitting: Family Medicine

## 2018-01-06 DIAGNOSIS — R630 Anorexia: Secondary | ICD-10-CM | POA: Diagnosis not present

## 2018-01-06 DIAGNOSIS — E46 Unspecified protein-calorie malnutrition: Secondary | ICD-10-CM | POA: Diagnosis not present

## 2018-01-06 DIAGNOSIS — M1710 Unilateral primary osteoarthritis, unspecified knee: Secondary | ICD-10-CM

## 2018-01-06 DIAGNOSIS — I10 Essential (primary) hypertension: Secondary | ICD-10-CM | POA: Diagnosis not present

## 2018-01-06 DIAGNOSIS — F0280 Dementia in other diseases classified elsewhere without behavioral disturbance: Secondary | ICD-10-CM | POA: Diagnosis not present

## 2018-01-06 DIAGNOSIS — I4891 Unspecified atrial fibrillation: Secondary | ICD-10-CM | POA: Diagnosis not present

## 2018-01-06 DIAGNOSIS — E119 Type 2 diabetes mellitus without complications: Secondary | ICD-10-CM | POA: Diagnosis not present

## 2018-01-06 DIAGNOSIS — M179 Osteoarthritis of knee, unspecified: Secondary | ICD-10-CM

## 2018-01-06 NOTE — Telephone Encounter (Signed)
Copied from [place] [phone]. Topic: Quick Communication - See Telephone Encounter >> Jan 06, 2018 10:42 AM Ether Griffins B wrote: CRM for notification. See Telephone encounter for:  Lynetta Mare with [facility] calling pt needing a refill on lidocaine patches. Only 6 come in a pack and pt uses 2 a day one on each leg on 12hrs off 12hrs. Also hospice doesn't cover the eliquis and the family doesn't want to pay out of pocket. She is wanting to know if you want to replace this medication with anything else. Call back number [phone].  01/06/18.

## 2018-01-06 NOTE — Telephone Encounter (Signed)
Lm on [place] from hospice phone that we did not prescribe the Lidocaine patches for Ms. [name]. I ask her if she knew who prescribed them.   Dr. Juleen China are you ok with changing the medication?

## 2018-01-07 DIAGNOSIS — I10 Essential (primary) hypertension: Secondary | ICD-10-CM | POA: Diagnosis not present

## 2018-01-07 DIAGNOSIS — E46 Unspecified protein-calorie malnutrition: Secondary | ICD-10-CM | POA: Diagnosis not present

## 2018-01-07 DIAGNOSIS — F0280 Dementia in other diseases classified elsewhere without behavioral disturbance: Secondary | ICD-10-CM | POA: Diagnosis not present

## 2018-01-07 DIAGNOSIS — I4891 Unspecified atrial fibrillation: Secondary | ICD-10-CM | POA: Diagnosis not present

## 2018-01-07 DIAGNOSIS — R630 Anorexia: Secondary | ICD-10-CM | POA: Diagnosis not present

## 2018-01-07 DIAGNOSIS — E119 Type 2 diabetes mellitus without complications: Secondary | ICD-10-CM | POA: Diagnosis not present

## 2018-01-08 DIAGNOSIS — I4891 Unspecified atrial fibrillation: Secondary | ICD-10-CM | POA: Diagnosis not present

## 2018-01-08 DIAGNOSIS — F0280 Dementia in other diseases classified elsewhere without behavioral disturbance: Secondary | ICD-10-CM | POA: Diagnosis not present

## 2018-01-08 DIAGNOSIS — I82503 Chronic embolism and thrombosis of unspecified deep veins of lower extremity, bilateral: Secondary | ICD-10-CM | POA: Diagnosis not present

## 2018-01-08 DIAGNOSIS — R630 Anorexia: Secondary | ICD-10-CM | POA: Diagnosis not present

## 2018-01-08 DIAGNOSIS — D649 Anemia, unspecified: Secondary | ICD-10-CM | POA: Diagnosis not present

## 2018-01-08 DIAGNOSIS — E039 Hypothyroidism, unspecified: Secondary | ICD-10-CM | POA: Diagnosis not present

## 2018-01-08 DIAGNOSIS — R609 Edema, unspecified: Secondary | ICD-10-CM | POA: Diagnosis not present

## 2018-01-08 DIAGNOSIS — H04123 Dry eye syndrome of bilateral lacrimal glands: Secondary | ICD-10-CM | POA: Diagnosis not present

## 2018-01-08 DIAGNOSIS — E119 Type 2 diabetes mellitus without complications: Secondary | ICD-10-CM | POA: Diagnosis not present

## 2018-01-08 DIAGNOSIS — G619 Inflammatory polyneuropathy, unspecified: Secondary | ICD-10-CM | POA: Diagnosis not present

## 2018-01-08 DIAGNOSIS — K219 Gastro-esophageal reflux disease without esophagitis: Secondary | ICD-10-CM | POA: Diagnosis not present

## 2018-01-08 DIAGNOSIS — I10 Essential (primary) hypertension: Secondary | ICD-10-CM | POA: Diagnosis not present

## 2018-01-08 DIAGNOSIS — N3289 Other specified disorders of bladder: Secondary | ICD-10-CM | POA: Diagnosis not present

## 2018-01-08 DIAGNOSIS — E785 Hyperlipidemia, unspecified: Secondary | ICD-10-CM | POA: Diagnosis not present

## 2018-01-08 DIAGNOSIS — E46 Unspecified protein-calorie malnutrition: Secondary | ICD-10-CM | POA: Diagnosis not present

## 2018-01-08 DIAGNOSIS — G2581 Restless legs syndrome: Secondary | ICD-10-CM | POA: Diagnosis not present

## 2018-01-08 DIAGNOSIS — F339 Major depressive disorder, recurrent, unspecified: Secondary | ICD-10-CM | POA: Diagnosis not present

## 2018-01-08 NOTE — Telephone Encounter (Signed)
Spoke to [place] with Hospice , told her per Dr. Juleen China As long as patient verbalizes understanding of risks of recurrent DVT with cessation of Eliquis, I completely agree to stopping the Eliquis and replacing with ASA 325 po daily, especially since she is now on hospice. Renea verbalized understanding and also said that pt is needing Lidocaine patches and being as Dr. Juleen China is the attending and [facility] doctor will not prescribe them Dr.  Juleen China will have to. Told her Renea Dr. Juleen China is out of the office till Monday and I will give her the message and someone will get back to her. Renea verbalized understanding.

## 2018-01-08 NOTE — Telephone Encounter (Signed)
Dr. Juleen China, [name] with [facility] said that pt is needing Lidocaine patches and being as you are the attending and Hopsice doctor will not prescribe them you will have to order them. Please advise.

## 2018-01-08 NOTE — Telephone Encounter (Signed)
Okay to ordering lidocaine patches though I doubt this will be affordable. Another option would be otc patches.

## 2018-01-08 NOTE — Telephone Encounter (Signed)
As long as patient verbalizes understanding of risks of recurrent DVT with cessation of Eliquis, I completely agree to stopping the Eliquis and replacing with ASA 325 po daily, especially since she is now on hospice.

## 2018-01-11 MED ORDER — LIDO-CAPSAICIN-MEN-METHYL SAL 0.5-0.035-5-20 % EX PTCH: MEDICATED_PATCH | CUTANEOUS | 2 refills | Status: DC

## 2018-01-11 NOTE — Telephone Encounter (Signed)
Prescription for patches sent to pharmacy. Notified [place] nurse that prescription has been sent in.

## 2018-01-12 DIAGNOSIS — E119 Type 2 diabetes mellitus without complications: Secondary | ICD-10-CM | POA: Diagnosis not present

## 2018-01-12 DIAGNOSIS — E46 Unspecified protein-calorie malnutrition: Secondary | ICD-10-CM | POA: Diagnosis not present

## 2018-01-12 DIAGNOSIS — I10 Essential (primary) hypertension: Secondary | ICD-10-CM | POA: Diagnosis not present

## 2018-01-12 DIAGNOSIS — F0280 Dementia in other diseases classified elsewhere without behavioral disturbance: Secondary | ICD-10-CM | POA: Diagnosis not present

## 2018-01-12 DIAGNOSIS — R630 Anorexia: Secondary | ICD-10-CM | POA: Diagnosis not present

## 2018-01-12 DIAGNOSIS — I4891 Unspecified atrial fibrillation: Secondary | ICD-10-CM | POA: Diagnosis not present

## 2018-01-14 DIAGNOSIS — I10 Essential (primary) hypertension: Secondary | ICD-10-CM | POA: Diagnosis not present

## 2018-01-14 DIAGNOSIS — F0280 Dementia in other diseases classified elsewhere without behavioral disturbance: Secondary | ICD-10-CM | POA: Diagnosis not present

## 2018-01-14 DIAGNOSIS — R630 Anorexia: Secondary | ICD-10-CM | POA: Diagnosis not present

## 2018-01-14 DIAGNOSIS — E119 Type 2 diabetes mellitus without complications: Secondary | ICD-10-CM | POA: Diagnosis not present

## 2018-01-14 DIAGNOSIS — E46 Unspecified protein-calorie malnutrition: Secondary | ICD-10-CM | POA: Diagnosis not present

## 2018-01-14 DIAGNOSIS — I4891 Unspecified atrial fibrillation: Secondary | ICD-10-CM | POA: Diagnosis not present

## 2018-01-15 DIAGNOSIS — E119 Type 2 diabetes mellitus without complications: Secondary | ICD-10-CM | POA: Diagnosis not present

## 2018-01-15 DIAGNOSIS — E46 Unspecified protein-calorie malnutrition: Secondary | ICD-10-CM | POA: Diagnosis not present

## 2018-01-15 DIAGNOSIS — R630 Anorexia: Secondary | ICD-10-CM | POA: Diagnosis not present

## 2018-01-15 DIAGNOSIS — I4891 Unspecified atrial fibrillation: Secondary | ICD-10-CM | POA: Diagnosis not present

## 2018-01-15 DIAGNOSIS — I10 Essential (primary) hypertension: Secondary | ICD-10-CM | POA: Diagnosis not present

## 2018-01-15 DIAGNOSIS — F0280 Dementia in other diseases classified elsewhere without behavioral disturbance: Secondary | ICD-10-CM | POA: Diagnosis not present

## 2018-01-17 DIAGNOSIS — R630 Anorexia: Secondary | ICD-10-CM | POA: Diagnosis not present

## 2018-01-17 DIAGNOSIS — I4891 Unspecified atrial fibrillation: Secondary | ICD-10-CM | POA: Diagnosis not present

## 2018-01-17 DIAGNOSIS — I10 Essential (primary) hypertension: Secondary | ICD-10-CM | POA: Diagnosis not present

## 2018-01-17 DIAGNOSIS — E119 Type 2 diabetes mellitus without complications: Secondary | ICD-10-CM | POA: Diagnosis not present

## 2018-01-17 DIAGNOSIS — E46 Unspecified protein-calorie malnutrition: Secondary | ICD-10-CM | POA: Diagnosis not present

## 2018-01-17 DIAGNOSIS — F0280 Dementia in other diseases classified elsewhere without behavioral disturbance: Secondary | ICD-10-CM | POA: Diagnosis not present

## 2018-01-19 DIAGNOSIS — I10 Essential (primary) hypertension: Secondary | ICD-10-CM | POA: Diagnosis not present

## 2018-01-19 DIAGNOSIS — I4891 Unspecified atrial fibrillation: Secondary | ICD-10-CM | POA: Diagnosis not present

## 2018-01-19 DIAGNOSIS — R630 Anorexia: Secondary | ICD-10-CM | POA: Diagnosis not present

## 2018-01-19 DIAGNOSIS — E119 Type 2 diabetes mellitus without complications: Secondary | ICD-10-CM | POA: Diagnosis not present

## 2018-01-19 DIAGNOSIS — E46 Unspecified protein-calorie malnutrition: Secondary | ICD-10-CM | POA: Diagnosis not present

## 2018-01-19 DIAGNOSIS — F0280 Dementia in other diseases classified elsewhere without behavioral disturbance: Secondary | ICD-10-CM | POA: Diagnosis not present

## 2018-01-20 DIAGNOSIS — E46 Unspecified protein-calorie malnutrition: Secondary | ICD-10-CM | POA: Diagnosis not present

## 2018-01-20 DIAGNOSIS — I10 Essential (primary) hypertension: Secondary | ICD-10-CM | POA: Diagnosis not present

## 2018-01-20 DIAGNOSIS — E119 Type 2 diabetes mellitus without complications: Secondary | ICD-10-CM | POA: Diagnosis not present

## 2018-01-20 DIAGNOSIS — F0280 Dementia in other diseases classified elsewhere without behavioral disturbance: Secondary | ICD-10-CM | POA: Diagnosis not present

## 2018-01-20 DIAGNOSIS — I4891 Unspecified atrial fibrillation: Secondary | ICD-10-CM | POA: Diagnosis not present

## 2018-01-20 DIAGNOSIS — R630 Anorexia: Secondary | ICD-10-CM | POA: Diagnosis not present

## 2018-01-21 DIAGNOSIS — F0280 Dementia in other diseases classified elsewhere without behavioral disturbance: Secondary | ICD-10-CM | POA: Diagnosis not present

## 2018-01-21 DIAGNOSIS — E119 Type 2 diabetes mellitus without complications: Secondary | ICD-10-CM | POA: Diagnosis not present

## 2018-01-21 DIAGNOSIS — I10 Essential (primary) hypertension: Secondary | ICD-10-CM | POA: Diagnosis not present

## 2018-01-21 DIAGNOSIS — I4891 Unspecified atrial fibrillation: Secondary | ICD-10-CM | POA: Diagnosis not present

## 2018-01-21 DIAGNOSIS — E46 Unspecified protein-calorie malnutrition: Secondary | ICD-10-CM | POA: Diagnosis not present

## 2018-01-21 DIAGNOSIS — R630 Anorexia: Secondary | ICD-10-CM | POA: Diagnosis not present

## 2018-01-22 DIAGNOSIS — E119 Type 2 diabetes mellitus without complications: Secondary | ICD-10-CM | POA: Diagnosis not present

## 2018-01-22 DIAGNOSIS — I10 Essential (primary) hypertension: Secondary | ICD-10-CM | POA: Diagnosis not present

## 2018-01-22 DIAGNOSIS — I4891 Unspecified atrial fibrillation: Secondary | ICD-10-CM | POA: Diagnosis not present

## 2018-01-22 DIAGNOSIS — E46 Unspecified protein-calorie malnutrition: Secondary | ICD-10-CM | POA: Diagnosis not present

## 2018-01-22 DIAGNOSIS — F0280 Dementia in other diseases classified elsewhere without behavioral disturbance: Secondary | ICD-10-CM | POA: Diagnosis not present

## 2018-01-22 DIAGNOSIS — R630 Anorexia: Secondary | ICD-10-CM | POA: Diagnosis not present

## 2018-01-25 ENCOUNTER — Telehealth: Payer: Self-pay | Admitting: Family Medicine

## 2018-01-25 NOTE — Telephone Encounter (Signed)
Spoke with pharmacy and they will dispense the lidocaine patch they have in [place].

## 2018-01-25 NOTE — Telephone Encounter (Signed)
Please advise.   Copied from [place]. Topic: General - Other >> Jan 25, 2018 12:36 PM Yvette Rack wrote: Reason for CRM:   Colletta [place] from [place] [phone] (Phone) calling stating that they can't find all the ingredients for RX Lido-Capsaicin-Men-Methyl Sal (MEDI-PATCH-LIDOCAINE) 0.5-0.035-5-20 % [facility]    and want to know if its called something else other  than what was prescribed for pt

## 2018-01-26 DIAGNOSIS — I10 Essential (primary) hypertension: Secondary | ICD-10-CM | POA: Diagnosis not present

## 2018-01-26 DIAGNOSIS — E46 Unspecified protein-calorie malnutrition: Secondary | ICD-10-CM | POA: Diagnosis not present

## 2018-01-26 DIAGNOSIS — F0280 Dementia in other diseases classified elsewhere without behavioral disturbance: Secondary | ICD-10-CM | POA: Diagnosis not present

## 2018-01-26 DIAGNOSIS — E119 Type 2 diabetes mellitus without complications: Secondary | ICD-10-CM | POA: Diagnosis not present

## 2018-01-26 DIAGNOSIS — R630 Anorexia: Secondary | ICD-10-CM | POA: Diagnosis not present

## 2018-01-26 DIAGNOSIS — I4891 Unspecified atrial fibrillation: Secondary | ICD-10-CM | POA: Diagnosis not present

## 2018-01-28 DIAGNOSIS — E119 Type 2 diabetes mellitus without complications: Secondary | ICD-10-CM | POA: Diagnosis not present

## 2018-01-28 DIAGNOSIS — F0280 Dementia in other diseases classified elsewhere without behavioral disturbance: Secondary | ICD-10-CM | POA: Diagnosis not present

## 2018-01-28 DIAGNOSIS — R630 Anorexia: Secondary | ICD-10-CM | POA: Diagnosis not present

## 2018-01-28 DIAGNOSIS — I10 Essential (primary) hypertension: Secondary | ICD-10-CM | POA: Diagnosis not present

## 2018-01-28 DIAGNOSIS — I4891 Unspecified atrial fibrillation: Secondary | ICD-10-CM | POA: Diagnosis not present

## 2018-01-28 DIAGNOSIS — E46 Unspecified protein-calorie malnutrition: Secondary | ICD-10-CM | POA: Diagnosis not present

## 2018-02-01 DIAGNOSIS — E46 Unspecified protein-calorie malnutrition: Secondary | ICD-10-CM | POA: Diagnosis not present

## 2018-02-01 DIAGNOSIS — E119 Type 2 diabetes mellitus without complications: Secondary | ICD-10-CM | POA: Diagnosis not present

## 2018-02-01 DIAGNOSIS — I4891 Unspecified atrial fibrillation: Secondary | ICD-10-CM | POA: Diagnosis not present

## 2018-02-01 DIAGNOSIS — F0280 Dementia in other diseases classified elsewhere without behavioral disturbance: Secondary | ICD-10-CM | POA: Diagnosis not present

## 2018-02-01 DIAGNOSIS — R630 Anorexia: Secondary | ICD-10-CM | POA: Diagnosis not present

## 2018-02-01 DIAGNOSIS — I10 Essential (primary) hypertension: Secondary | ICD-10-CM | POA: Diagnosis not present

## 2018-02-02 DIAGNOSIS — E46 Unspecified protein-calorie malnutrition: Secondary | ICD-10-CM | POA: Diagnosis not present

## 2018-02-02 DIAGNOSIS — F0280 Dementia in other diseases classified elsewhere without behavioral disturbance: Secondary | ICD-10-CM | POA: Diagnosis not present

## 2018-02-02 DIAGNOSIS — I4891 Unspecified atrial fibrillation: Secondary | ICD-10-CM | POA: Diagnosis not present

## 2018-02-02 DIAGNOSIS — I10 Essential (primary) hypertension: Secondary | ICD-10-CM | POA: Diagnosis not present

## 2018-02-02 DIAGNOSIS — E119 Type 2 diabetes mellitus without complications: Secondary | ICD-10-CM | POA: Diagnosis not present

## 2018-02-02 DIAGNOSIS — R630 Anorexia: Secondary | ICD-10-CM | POA: Diagnosis not present

## 2018-02-04 DIAGNOSIS — E119 Type 2 diabetes mellitus without complications: Secondary | ICD-10-CM | POA: Diagnosis not present

## 2018-02-04 DIAGNOSIS — I10 Essential (primary) hypertension: Secondary | ICD-10-CM | POA: Diagnosis not present

## 2018-02-04 DIAGNOSIS — F0280 Dementia in other diseases classified elsewhere without behavioral disturbance: Secondary | ICD-10-CM | POA: Diagnosis not present

## 2018-02-04 DIAGNOSIS — I4891 Unspecified atrial fibrillation: Secondary | ICD-10-CM | POA: Diagnosis not present

## 2018-02-04 DIAGNOSIS — R630 Anorexia: Secondary | ICD-10-CM | POA: Diagnosis not present

## 2018-02-04 DIAGNOSIS — E46 Unspecified protein-calorie malnutrition: Secondary | ICD-10-CM | POA: Diagnosis not present

## 2018-02-05 DIAGNOSIS — E785 Hyperlipidemia, unspecified: Secondary | ICD-10-CM | POA: Diagnosis not present

## 2018-02-05 DIAGNOSIS — I10 Essential (primary) hypertension: Secondary | ICD-10-CM | POA: Diagnosis not present

## 2018-02-05 DIAGNOSIS — R630 Anorexia: Secondary | ICD-10-CM | POA: Diagnosis not present

## 2018-02-05 DIAGNOSIS — H04123 Dry eye syndrome of bilateral lacrimal glands: Secondary | ICD-10-CM | POA: Diagnosis not present

## 2018-02-05 DIAGNOSIS — F0280 Dementia in other diseases classified elsewhere without behavioral disturbance: Secondary | ICD-10-CM | POA: Diagnosis not present

## 2018-02-05 DIAGNOSIS — R609 Edema, unspecified: Secondary | ICD-10-CM | POA: Diagnosis not present

## 2018-02-05 DIAGNOSIS — E119 Type 2 diabetes mellitus without complications: Secondary | ICD-10-CM | POA: Diagnosis not present

## 2018-02-05 DIAGNOSIS — G619 Inflammatory polyneuropathy, unspecified: Secondary | ICD-10-CM | POA: Diagnosis not present

## 2018-02-05 DIAGNOSIS — I4891 Unspecified atrial fibrillation: Secondary | ICD-10-CM | POA: Diagnosis not present

## 2018-02-05 DIAGNOSIS — N3289 Other specified disorders of bladder: Secondary | ICD-10-CM | POA: Diagnosis not present

## 2018-02-05 DIAGNOSIS — F339 Major depressive disorder, recurrent, unspecified: Secondary | ICD-10-CM | POA: Diagnosis not present

## 2018-02-05 DIAGNOSIS — D649 Anemia, unspecified: Secondary | ICD-10-CM | POA: Diagnosis not present

## 2018-02-05 DIAGNOSIS — I82503 Chronic embolism and thrombosis of unspecified deep veins of lower extremity, bilateral: Secondary | ICD-10-CM | POA: Diagnosis not present

## 2018-02-05 DIAGNOSIS — E46 Unspecified protein-calorie malnutrition: Secondary | ICD-10-CM | POA: Diagnosis not present

## 2018-02-05 DIAGNOSIS — E039 Hypothyroidism, unspecified: Secondary | ICD-10-CM | POA: Diagnosis not present

## 2018-02-05 DIAGNOSIS — K219 Gastro-esophageal reflux disease without esophagitis: Secondary | ICD-10-CM | POA: Diagnosis not present

## 2018-02-05 DIAGNOSIS — G2581 Restless legs syndrome: Secondary | ICD-10-CM | POA: Diagnosis not present

## 2018-02-09 DIAGNOSIS — I4891 Unspecified atrial fibrillation: Secondary | ICD-10-CM | POA: Diagnosis not present

## 2018-02-09 DIAGNOSIS — E119 Type 2 diabetes mellitus without complications: Secondary | ICD-10-CM | POA: Diagnosis not present

## 2018-02-09 DIAGNOSIS — E46 Unspecified protein-calorie malnutrition: Secondary | ICD-10-CM | POA: Diagnosis not present

## 2018-02-09 DIAGNOSIS — F0280 Dementia in other diseases classified elsewhere without behavioral disturbance: Secondary | ICD-10-CM | POA: Diagnosis not present

## 2018-02-09 DIAGNOSIS — I10 Essential (primary) hypertension: Secondary | ICD-10-CM | POA: Diagnosis not present

## 2018-02-09 DIAGNOSIS — R630 Anorexia: Secondary | ICD-10-CM | POA: Diagnosis not present

## 2018-02-12 DIAGNOSIS — F0280 Dementia in other diseases classified elsewhere without behavioral disturbance: Secondary | ICD-10-CM | POA: Diagnosis not present

## 2018-02-12 DIAGNOSIS — E119 Type 2 diabetes mellitus without complications: Secondary | ICD-10-CM | POA: Diagnosis not present

## 2018-02-12 DIAGNOSIS — I4891 Unspecified atrial fibrillation: Secondary | ICD-10-CM | POA: Diagnosis not present

## 2018-02-12 DIAGNOSIS — I10 Essential (primary) hypertension: Secondary | ICD-10-CM | POA: Diagnosis not present

## 2018-02-12 DIAGNOSIS — R630 Anorexia: Secondary | ICD-10-CM | POA: Diagnosis not present

## 2018-02-12 DIAGNOSIS — E46 Unspecified protein-calorie malnutrition: Secondary | ICD-10-CM | POA: Diagnosis not present

## 2018-02-15 IMAGING — CT CT ABD-PELV W/ CM
2 of 5 series · 16 of 46 positions shown, 18 images · IV contrast (ISOVUE)
Comparison: 11/22/2017

CLINICAL DATA: Nausea and vomiting and abdominal pain for several
days

EXAM:
CT ABDOMEN AND PELVIS WITH CONTRAST
TECHNIQUE: Multidetector CT imaging of the abdomen and pelvis was performed
using the standard protocol following bolus administration of
intravenous contrast.
CONTRAST:  80 mL Isovue 300

[Series 2: axial st · axial · 0.75mm/px · z∈[-350,-34]mm · 13 of 75 slices shown, 15 images]
[im 6/75  soft-tissue]
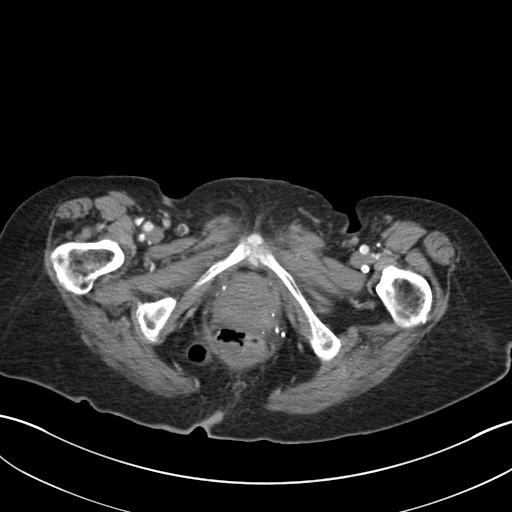
[im 6/75  bone]
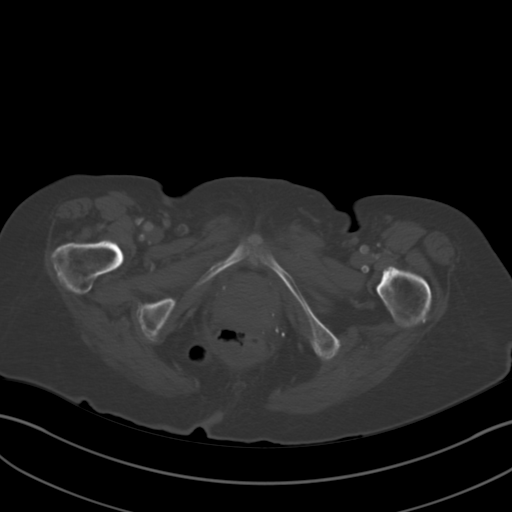
[im 11/75  soft-tissue]
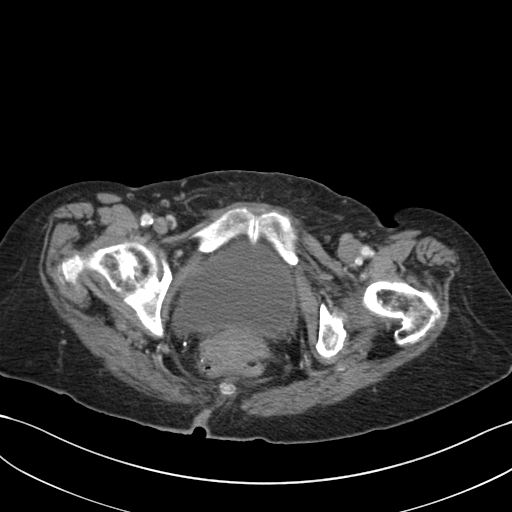
[im 16/75  soft-tissue]
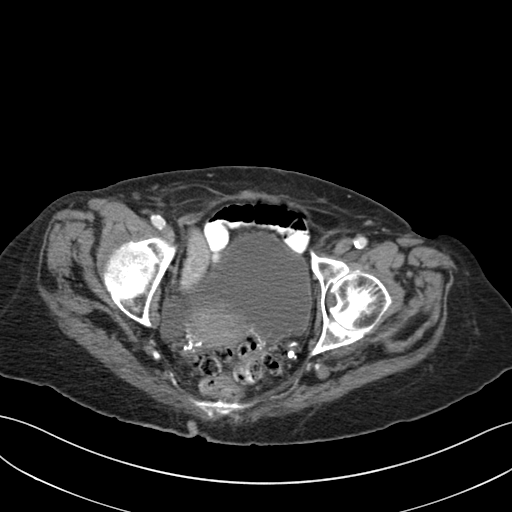
[im 22/75  soft-tissue]
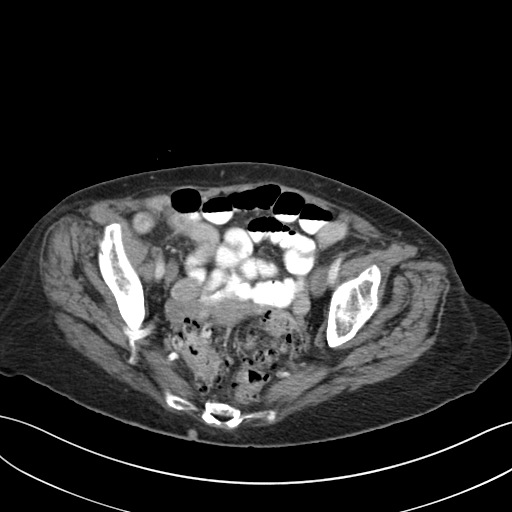
[im 27/75  soft-tissue]
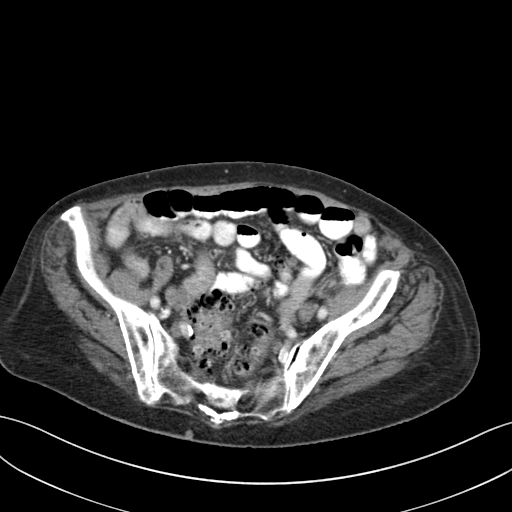
[im 32/75  soft-tissue]
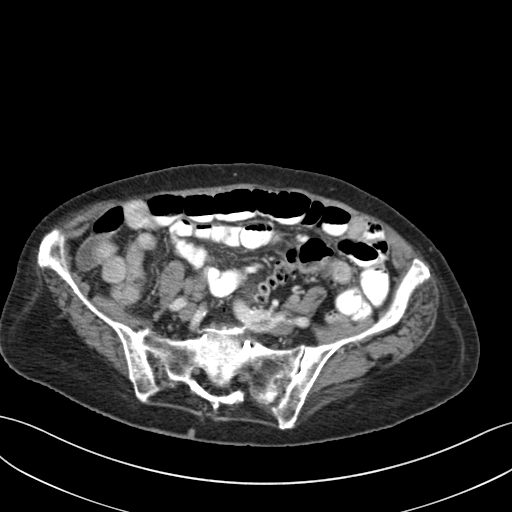
[im 38/75  soft-tissue]
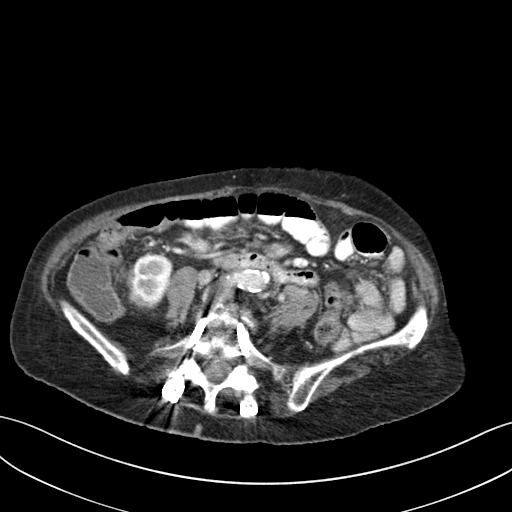
[im 43/75  soft-tissue]
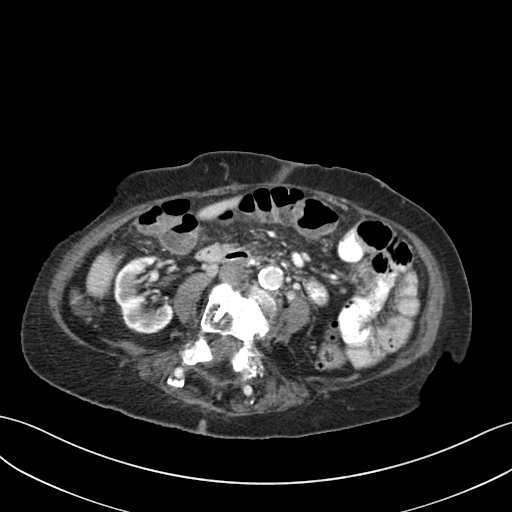
[im 48/75  soft-tissue]
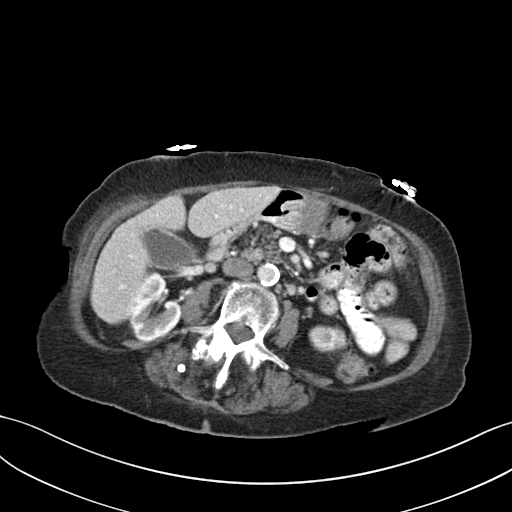
[im 48/75  bone]
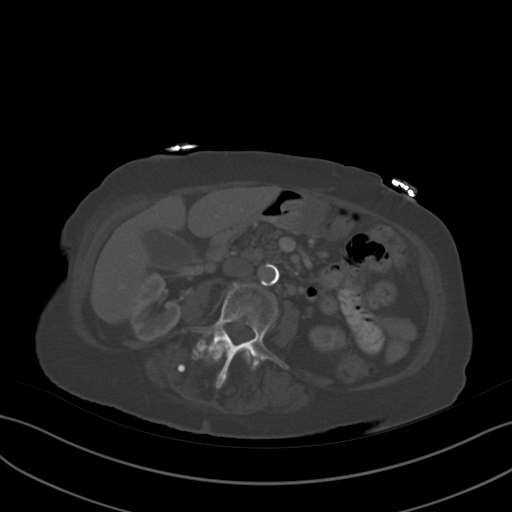
[im 53/75  soft-tissue]
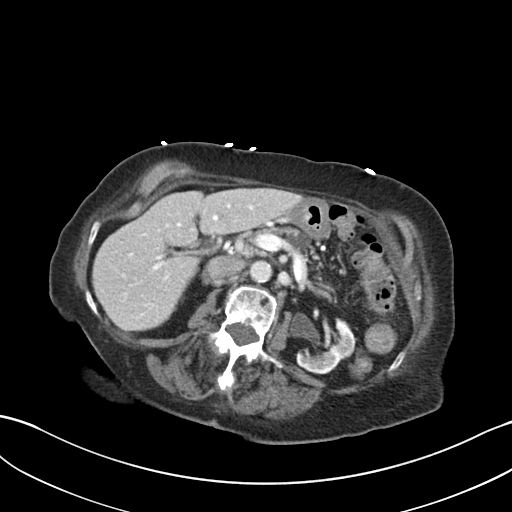
[im 59/75  soft-tissue]
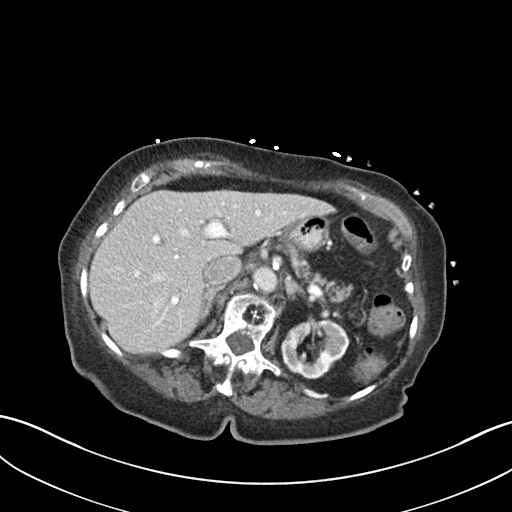
[im 64/75  soft-tissue]
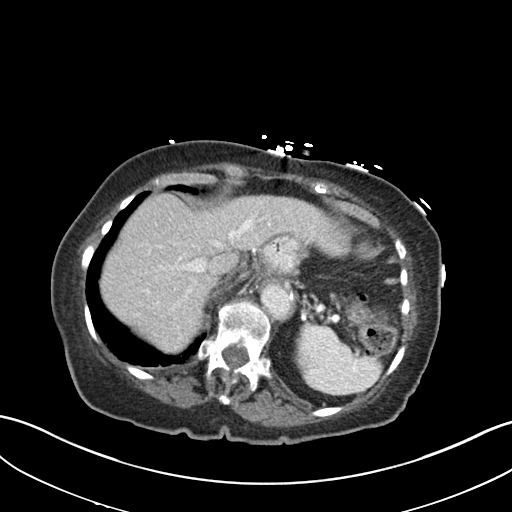
[im 69/75  soft-tissue]
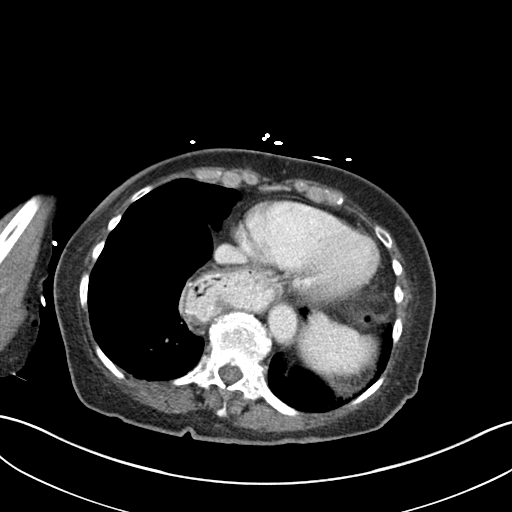

[Series 5: coronal st · coronal · 0.74mm/px · 3 of 82 slices shown]
[im 28/82  soft-tissue]
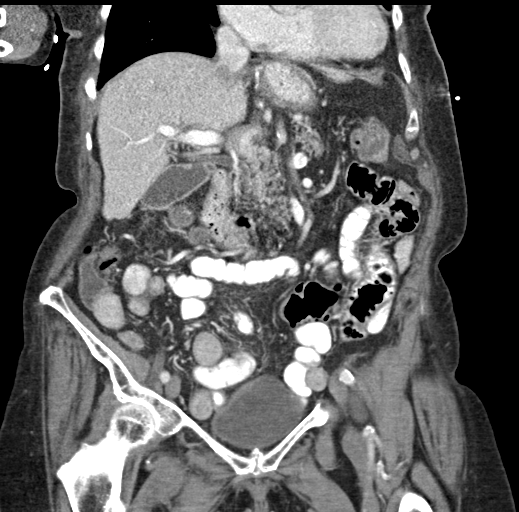
[im 37/82  soft-tissue]
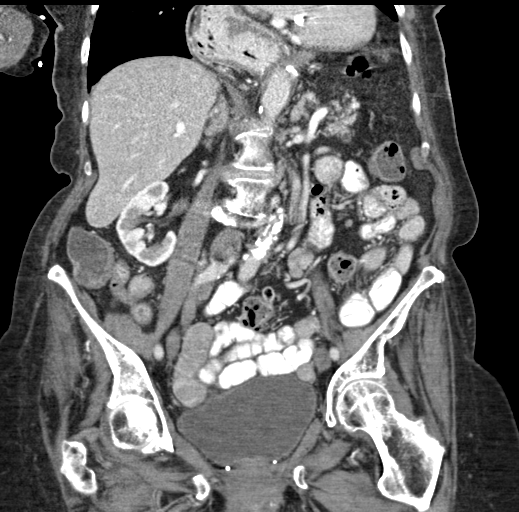
[im 46/82  soft-tissue]
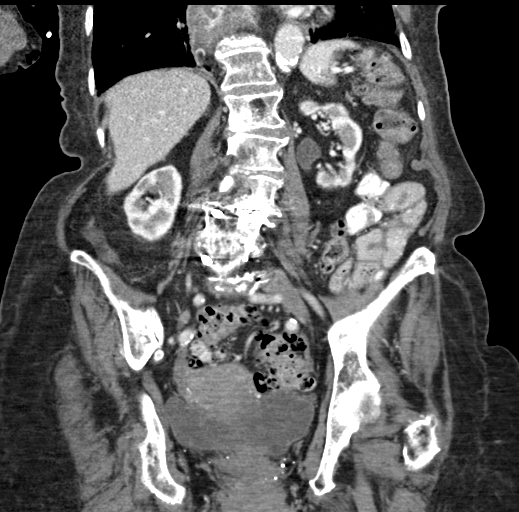

[16 of 46 positions shown; findings below may reference images not displayed]

FINDINGS: Lower chest: Bibasilar scarring is noted. No acute infiltrate is
noted. Large hiatal hernia is again noted.

Hepatobiliary: Stable hypodensities are noted within the liver
consistent with small cysts. The gallbladder is within normal
limits.

Pancreas: Stable cystic lesion is noted in the midportion of the
pancreas. No inflammatory changes are noted.

Spleen: Normal in size without focal abnormality.

Adrenals/Urinary Tract: Adrenal glands are unremarkable. Kidneys are
normal, without renal calculi, focal lesion, or hydronephrosis.
Bladder is well distended.

Stomach/Bowel: Diverticular change of the colon is noted without
evidence of diverticulitis. No obstructive changes are seen. The
appendix is again not well visualized. No inflammatory changes to
suggest appendicitis are seen. No obstructive or inflammatory
changes are noted.

Vascular/Lymphatic: Aortic atherosclerosis. No enlarged abdominal or
pelvic lymph nodes.

Reproductive: Uterus and bilateral adnexa are unremarkable.

Other: No abdominal wall hernia or abnormality. No abdominopelvic
ascites.

Musculoskeletal: Postsurgical changes are again seen and stable.
Degenerative changes of the lumbar spine are noted.
IMPRESSION: Diverticulosis without diverticulitis.

Stable pancreatic cystic lesion.

Chronic changes as described above without acute abnormality.

## 2018-02-16 DIAGNOSIS — I10 Essential (primary) hypertension: Secondary | ICD-10-CM | POA: Diagnosis not present

## 2018-02-16 DIAGNOSIS — I4891 Unspecified atrial fibrillation: Secondary | ICD-10-CM | POA: Diagnosis not present

## 2018-02-16 DIAGNOSIS — F0280 Dementia in other diseases classified elsewhere without behavioral disturbance: Secondary | ICD-10-CM | POA: Diagnosis not present

## 2018-02-16 DIAGNOSIS — E119 Type 2 diabetes mellitus without complications: Secondary | ICD-10-CM | POA: Diagnosis not present

## 2018-02-16 DIAGNOSIS — R630 Anorexia: Secondary | ICD-10-CM | POA: Diagnosis not present

## 2018-02-16 DIAGNOSIS — E46 Unspecified protein-calorie malnutrition: Secondary | ICD-10-CM | POA: Diagnosis not present

## 2018-02-18 ENCOUNTER — Telehealth: Payer: Self-pay | Admitting: Family Medicine

## 2018-02-18 DIAGNOSIS — E46 Unspecified protein-calorie malnutrition: Secondary | ICD-10-CM | POA: Diagnosis not present

## 2018-02-18 DIAGNOSIS — F0280 Dementia in other diseases classified elsewhere without behavioral disturbance: Secondary | ICD-10-CM | POA: Diagnosis not present

## 2018-02-18 DIAGNOSIS — R630 Anorexia: Secondary | ICD-10-CM | POA: Diagnosis not present

## 2018-02-18 DIAGNOSIS — E119 Type 2 diabetes mellitus without complications: Secondary | ICD-10-CM | POA: Diagnosis not present

## 2018-02-18 DIAGNOSIS — I10 Essential (primary) hypertension: Secondary | ICD-10-CM | POA: Diagnosis not present

## 2018-02-18 DIAGNOSIS — I4891 Unspecified atrial fibrillation: Secondary | ICD-10-CM | POA: Diagnosis not present

## 2018-02-18 NOTE — Telephone Encounter (Signed)
Copied from [place] [id number]. Topic: Quick Communication - Rx Refill/Question >> Feb 18, 2018  2:37 PM Bea Graff, NT wrote: Medication: ropinirole (REQUIP)    Has the patient contacted their pharmacy? Yes.     (Agent: If no, request that the patient contact the pharmacy for the refill.)   Preferred Pharmacy (with phone number or street name): [facility] [address]   Agent: Please be advised that RX refills may take up to 3 business days. We ask that you follow-up with your pharmacy.

## 2018-02-19 ENCOUNTER — Other Ambulatory Visit: Payer: Self-pay

## 2018-02-19 MED ORDER — ROPINIROLE HCL 5 MG PO TABS: 5.0000 mg | ORAL_TABLET | Freq: Every day | ORAL | 1 refills | Status: DC

## 2018-02-20 DIAGNOSIS — R630 Anorexia: Secondary | ICD-10-CM | POA: Diagnosis not present

## 2018-02-20 DIAGNOSIS — I10 Essential (primary) hypertension: Secondary | ICD-10-CM | POA: Diagnosis not present

## 2018-02-20 DIAGNOSIS — F0280 Dementia in other diseases classified elsewhere without behavioral disturbance: Secondary | ICD-10-CM | POA: Diagnosis not present

## 2018-02-20 DIAGNOSIS — E46 Unspecified protein-calorie malnutrition: Secondary | ICD-10-CM | POA: Diagnosis not present

## 2018-02-20 DIAGNOSIS — E119 Type 2 diabetes mellitus without complications: Secondary | ICD-10-CM | POA: Diagnosis not present

## 2018-02-20 DIAGNOSIS — I4891 Unspecified atrial fibrillation: Secondary | ICD-10-CM | POA: Diagnosis not present

## 2018-02-23 DIAGNOSIS — E119 Type 2 diabetes mellitus without complications: Secondary | ICD-10-CM | POA: Diagnosis not present

## 2018-02-23 DIAGNOSIS — I10 Essential (primary) hypertension: Secondary | ICD-10-CM | POA: Diagnosis not present

## 2018-02-23 DIAGNOSIS — F0280 Dementia in other diseases classified elsewhere without behavioral disturbance: Secondary | ICD-10-CM | POA: Diagnosis not present

## 2018-02-23 DIAGNOSIS — I4891 Unspecified atrial fibrillation: Secondary | ICD-10-CM | POA: Diagnosis not present

## 2018-02-23 DIAGNOSIS — R630 Anorexia: Secondary | ICD-10-CM | POA: Diagnosis not present

## 2018-02-23 DIAGNOSIS — E46 Unspecified protein-calorie malnutrition: Secondary | ICD-10-CM | POA: Diagnosis not present

## 2018-02-25 DIAGNOSIS — E46 Unspecified protein-calorie malnutrition: Secondary | ICD-10-CM | POA: Diagnosis not present

## 2018-02-25 DIAGNOSIS — I4891 Unspecified atrial fibrillation: Secondary | ICD-10-CM | POA: Diagnosis not present

## 2018-02-25 DIAGNOSIS — E119 Type 2 diabetes mellitus without complications: Secondary | ICD-10-CM | POA: Diagnosis not present

## 2018-02-25 DIAGNOSIS — F0280 Dementia in other diseases classified elsewhere without behavioral disturbance: Secondary | ICD-10-CM | POA: Diagnosis not present

## 2018-02-25 DIAGNOSIS — I10 Essential (primary) hypertension: Secondary | ICD-10-CM | POA: Diagnosis not present

## 2018-02-25 DIAGNOSIS — R630 Anorexia: Secondary | ICD-10-CM | POA: Diagnosis not present

## 2018-02-26 DIAGNOSIS — E46 Unspecified protein-calorie malnutrition: Secondary | ICD-10-CM | POA: Diagnosis not present

## 2018-02-26 DIAGNOSIS — I10 Essential (primary) hypertension: Secondary | ICD-10-CM | POA: Diagnosis not present

## 2018-02-26 DIAGNOSIS — E119 Type 2 diabetes mellitus without complications: Secondary | ICD-10-CM | POA: Diagnosis not present

## 2018-02-26 DIAGNOSIS — F0280 Dementia in other diseases classified elsewhere without behavioral disturbance: Secondary | ICD-10-CM | POA: Diagnosis not present

## 2018-02-26 DIAGNOSIS — I4891 Unspecified atrial fibrillation: Secondary | ICD-10-CM | POA: Diagnosis not present

## 2018-02-26 DIAGNOSIS — R630 Anorexia: Secondary | ICD-10-CM | POA: Diagnosis not present

## 2018-03-02 DIAGNOSIS — E46 Unspecified protein-calorie malnutrition: Secondary | ICD-10-CM | POA: Diagnosis not present

## 2018-03-02 DIAGNOSIS — R630 Anorexia: Secondary | ICD-10-CM | POA: Diagnosis not present

## 2018-03-02 DIAGNOSIS — F0280 Dementia in other diseases classified elsewhere without behavioral disturbance: Secondary | ICD-10-CM | POA: Diagnosis not present

## 2018-03-02 DIAGNOSIS — I4891 Unspecified atrial fibrillation: Secondary | ICD-10-CM | POA: Diagnosis not present

## 2018-03-02 DIAGNOSIS — E119 Type 2 diabetes mellitus without complications: Secondary | ICD-10-CM | POA: Diagnosis not present

## 2018-03-02 DIAGNOSIS — I10 Essential (primary) hypertension: Secondary | ICD-10-CM | POA: Diagnosis not present

## 2018-03-03 DIAGNOSIS — I10 Essential (primary) hypertension: Secondary | ICD-10-CM | POA: Diagnosis not present

## 2018-03-03 DIAGNOSIS — F0280 Dementia in other diseases classified elsewhere without behavioral disturbance: Secondary | ICD-10-CM | POA: Diagnosis not present

## 2018-03-03 DIAGNOSIS — E119 Type 2 diabetes mellitus without complications: Secondary | ICD-10-CM | POA: Diagnosis not present

## 2018-03-03 DIAGNOSIS — E46 Unspecified protein-calorie malnutrition: Secondary | ICD-10-CM | POA: Diagnosis not present

## 2018-03-03 DIAGNOSIS — R630 Anorexia: Secondary | ICD-10-CM | POA: Diagnosis not present

## 2018-03-03 DIAGNOSIS — I4891 Unspecified atrial fibrillation: Secondary | ICD-10-CM | POA: Diagnosis not present

## 2018-03-08 DIAGNOSIS — G619 Inflammatory polyneuropathy, unspecified: Secondary | ICD-10-CM | POA: Diagnosis not present

## 2018-03-08 DIAGNOSIS — G2581 Restless legs syndrome: Secondary | ICD-10-CM | POA: Diagnosis not present

## 2018-03-08 DIAGNOSIS — N3289 Other specified disorders of bladder: Secondary | ICD-10-CM | POA: Diagnosis not present

## 2018-03-08 DIAGNOSIS — I10 Essential (primary) hypertension: Secondary | ICD-10-CM | POA: Diagnosis not present

## 2018-03-08 DIAGNOSIS — D649 Anemia, unspecified: Secondary | ICD-10-CM | POA: Diagnosis not present

## 2018-03-08 DIAGNOSIS — E46 Unspecified protein-calorie malnutrition: Secondary | ICD-10-CM | POA: Diagnosis not present

## 2018-03-08 DIAGNOSIS — I4891 Unspecified atrial fibrillation: Secondary | ICD-10-CM | POA: Diagnosis not present

## 2018-03-08 DIAGNOSIS — F0280 Dementia in other diseases classified elsewhere without behavioral disturbance: Secondary | ICD-10-CM | POA: Diagnosis not present

## 2018-03-08 DIAGNOSIS — K219 Gastro-esophageal reflux disease without esophagitis: Secondary | ICD-10-CM | POA: Diagnosis not present

## 2018-03-08 DIAGNOSIS — E785 Hyperlipidemia, unspecified: Secondary | ICD-10-CM | POA: Diagnosis not present

## 2018-03-08 DIAGNOSIS — F339 Major depressive disorder, recurrent, unspecified: Secondary | ICD-10-CM | POA: Diagnosis not present

## 2018-03-08 DIAGNOSIS — I82503 Chronic embolism and thrombosis of unspecified deep veins of lower extremity, bilateral: Secondary | ICD-10-CM | POA: Diagnosis not present

## 2018-03-08 DIAGNOSIS — R630 Anorexia: Secondary | ICD-10-CM | POA: Diagnosis not present

## 2018-03-08 DIAGNOSIS — E039 Hypothyroidism, unspecified: Secondary | ICD-10-CM | POA: Diagnosis not present

## 2018-03-08 DIAGNOSIS — R609 Edema, unspecified: Secondary | ICD-10-CM | POA: Diagnosis not present

## 2018-03-08 DIAGNOSIS — H04123 Dry eye syndrome of bilateral lacrimal glands: Secondary | ICD-10-CM | POA: Diagnosis not present

## 2018-03-08 DIAGNOSIS — E119 Type 2 diabetes mellitus without complications: Secondary | ICD-10-CM | POA: Diagnosis not present

## 2018-03-09 DIAGNOSIS — E119 Type 2 diabetes mellitus without complications: Secondary | ICD-10-CM | POA: Diagnosis not present

## 2018-03-09 DIAGNOSIS — I10 Essential (primary) hypertension: Secondary | ICD-10-CM | POA: Diagnosis not present

## 2018-03-09 DIAGNOSIS — R630 Anorexia: Secondary | ICD-10-CM | POA: Diagnosis not present

## 2018-03-09 DIAGNOSIS — I4891 Unspecified atrial fibrillation: Secondary | ICD-10-CM | POA: Diagnosis not present

## 2018-03-09 DIAGNOSIS — E46 Unspecified protein-calorie malnutrition: Secondary | ICD-10-CM | POA: Diagnosis not present

## 2018-03-09 DIAGNOSIS — F0280 Dementia in other diseases classified elsewhere without behavioral disturbance: Secondary | ICD-10-CM | POA: Diagnosis not present

## 2018-03-11 DIAGNOSIS — I4891 Unspecified atrial fibrillation: Secondary | ICD-10-CM | POA: Diagnosis not present

## 2018-03-11 DIAGNOSIS — E46 Unspecified protein-calorie malnutrition: Secondary | ICD-10-CM | POA: Diagnosis not present

## 2018-03-11 DIAGNOSIS — R630 Anorexia: Secondary | ICD-10-CM | POA: Diagnosis not present

## 2018-03-11 DIAGNOSIS — E119 Type 2 diabetes mellitus without complications: Secondary | ICD-10-CM | POA: Diagnosis not present

## 2018-03-11 DIAGNOSIS — I10 Essential (primary) hypertension: Secondary | ICD-10-CM | POA: Diagnosis not present

## 2018-03-11 DIAGNOSIS — F0280 Dementia in other diseases classified elsewhere without behavioral disturbance: Secondary | ICD-10-CM | POA: Diagnosis not present

## 2018-03-12 ENCOUNTER — Telehealth: Payer: Self-pay | Admitting: Family Medicine

## 2018-03-12 DIAGNOSIS — E46 Unspecified protein-calorie malnutrition: Secondary | ICD-10-CM | POA: Diagnosis not present

## 2018-03-12 DIAGNOSIS — E119 Type 2 diabetes mellitus without complications: Secondary | ICD-10-CM | POA: Diagnosis not present

## 2018-03-12 DIAGNOSIS — I4891 Unspecified atrial fibrillation: Secondary | ICD-10-CM | POA: Diagnosis not present

## 2018-03-12 DIAGNOSIS — I10 Essential (primary) hypertension: Secondary | ICD-10-CM | POA: Diagnosis not present

## 2018-03-12 DIAGNOSIS — R630 Anorexia: Secondary | ICD-10-CM | POA: Diagnosis not present

## 2018-03-12 DIAGNOSIS — F0280 Dementia in other diseases classified elsewhere without behavioral disturbance: Secondary | ICD-10-CM | POA: Diagnosis not present

## 2018-03-12 NOTE — Telephone Encounter (Signed)
Copied from [place]. Topic: Quick Communication - Rx Refill/Question >> Mar 12, 2018  1:58 PM Waylan Rocher, L[place] L wrote: Medication: Voltaren gel 1% Has the patient contacted their pharmacy? Yes.   (Agent: If no, request that the patient contact the pharmacy for the refill.) Preferred Pharmacy (with phone number or street name): [place], [place] [address] Phone: [phone] Fax: [phone] Agent: Please be advised that RX refills may take up to 3 business days. We ask that you follow-up with your pharmacy.

## 2018-03-12 NOTE — Telephone Encounter (Signed)
Pt. Is requesting Voltaren gel for "aches and pains." Has used it in the past and pt. States it "works very well." Please advise pt.

## 2018-03-12 NOTE — Telephone Encounter (Signed)
Please advise 

## 2018-03-13 NOTE — Telephone Encounter (Signed)
Needs appt as she should be coming in every three months anyway.

## 2018-03-16 DIAGNOSIS — R630 Anorexia: Secondary | ICD-10-CM | POA: Diagnosis not present

## 2018-03-16 DIAGNOSIS — I4891 Unspecified atrial fibrillation: Secondary | ICD-10-CM | POA: Diagnosis not present

## 2018-03-16 DIAGNOSIS — E46 Unspecified protein-calorie malnutrition: Secondary | ICD-10-CM | POA: Diagnosis not present

## 2018-03-16 DIAGNOSIS — F0280 Dementia in other diseases classified elsewhere without behavioral disturbance: Secondary | ICD-10-CM | POA: Diagnosis not present

## 2018-03-16 DIAGNOSIS — E119 Type 2 diabetes mellitus without complications: Secondary | ICD-10-CM | POA: Diagnosis not present

## 2018-03-16 DIAGNOSIS — I10 Essential (primary) hypertension: Secondary | ICD-10-CM | POA: Diagnosis not present

## 2018-03-17 NOTE — Telephone Encounter (Signed)
I have scheduled patient to come in Friday.

## 2018-03-18 DIAGNOSIS — R630 Anorexia: Secondary | ICD-10-CM | POA: Diagnosis not present

## 2018-03-18 DIAGNOSIS — I4891 Unspecified atrial fibrillation: Secondary | ICD-10-CM | POA: Diagnosis not present

## 2018-03-18 DIAGNOSIS — E46 Unspecified protein-calorie malnutrition: Secondary | ICD-10-CM | POA: Diagnosis not present

## 2018-03-18 DIAGNOSIS — I10 Essential (primary) hypertension: Secondary | ICD-10-CM | POA: Diagnosis not present

## 2018-03-18 DIAGNOSIS — E119 Type 2 diabetes mellitus without complications: Secondary | ICD-10-CM | POA: Diagnosis not present

## 2018-03-18 DIAGNOSIS — F0280 Dementia in other diseases classified elsewhere without behavioral disturbance: Secondary | ICD-10-CM | POA: Diagnosis not present

## 2018-03-19 ENCOUNTER — Ambulatory Visit: Payer: Medicare Other | Admitting: Family Medicine

## 2018-03-19 DIAGNOSIS — F0280 Dementia in other diseases classified elsewhere without behavioral disturbance: Secondary | ICD-10-CM | POA: Diagnosis not present

## 2018-03-19 DIAGNOSIS — R630 Anorexia: Secondary | ICD-10-CM | POA: Diagnosis not present

## 2018-03-19 DIAGNOSIS — E46 Unspecified protein-calorie malnutrition: Secondary | ICD-10-CM | POA: Diagnosis not present

## 2018-03-19 DIAGNOSIS — Z0289 Encounter for other administrative examinations: Secondary | ICD-10-CM

## 2018-03-19 DIAGNOSIS — I10 Essential (primary) hypertension: Secondary | ICD-10-CM | POA: Diagnosis not present

## 2018-03-19 DIAGNOSIS — E119 Type 2 diabetes mellitus without complications: Secondary | ICD-10-CM | POA: Diagnosis not present

## 2018-03-19 DIAGNOSIS — I4891 Unspecified atrial fibrillation: Secondary | ICD-10-CM | POA: Diagnosis not present

## 2018-03-19 NOTE — Progress Notes (Deleted)
[name] is a 84 y.o. female is here for follow up.  History of Present Illness:   HPI: Health Maintenance Due  Topic Date Due  . OPHTHALMOLOGY EXAM  07[birth year]/1931  . TETANUS/TDAP  07/23/1940  . DEXA SCAN  07/23/1986  . PNA vac Low Risk Adult (1 of 2 - PCV13) 07/23/1986   Depression screen PHQ 2/9 11/03/2017  Decreased Interest 0  Down, Depressed, Hopeless 0  PHQ - 2 Score 0   PMHx, SurgHx, SocialHx, FamHx, Medications, and Allergies were reviewed in the Visit Navigator and updated as appropriate.   Patient Active Problem List   Diagnosis Date Noted  . Auditory hallucinations 12/25/2017  . Altered mental status   . DNR (do not resuscitate) 12/12/2017  . CKD (chronic kidney disease), stage III ([place]) 11/22/2017  . Hypertension 11/22/2017  . DVT (deep vein thrombosis) in pregnancy ([place]) 11/22/2017  . Atrial fibrillation, chronic ([place]) 11/22/2017  . Depression 11/22/2017  . Protein-calorie malnutrition, moderate ([place]) 11/22/2017  . Dry skin dermatitis 09/24/2017  . Osteoarthritis 07/31/2017  . CKD stage 4 due to type 2 diabetes mellitus ([place]) 04/19/2017  . Iron deficiency anemia 04/19/2017  . B12 deficiency 04/08/2017  . Chronic constipation 04/08/2017  . Chronic deep vein thrombosis (DVT) of left lower extremity ([place]) 04/08/2017  . Episode of recurrent major depressive disorder ([place]) 04/08/2017  . Monoclonal gammopathy of unknown significance (MGUS) 04/08/2017  . Peripheral neuropathy 04/08/2017  . Rectal mass   . Abdominal pain 06/12/2015  . UTI (urinary tract infection) 03/02/2015  . Pancreas divisum 03/02/2015  . Physical deconditioning 03/01/2015  . Nausea vomiting and diarrhea   . Pancreatic cyst   . Hiatal hernia- moderate to large 10/06/2014  . Heme + stool 10/04/2014  . Crohn's ileocolitis ([place]) 10/04/2014  . 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 Arthritis   . Hx pulmonary embolism   . Hypothyroidism 04/11/2014  . Type 2 diabetes mellitus with stage 3 chronic  kidney disease ([place]) 04/11/2014  . Anemia of chronic disease 04/03/2014  . OAB (overactive bladder) 01/07/2012  . Restless leg syndrome 03/09/2008  . Benign essential hypertension 03/09/2008  . ESOPHAGEAL STRICTURE 03/09/2008  . GERD (gastroesophageal reflux disease) 03/09/2008  . Diverticulosis of large intestine 03/09/2008  . Polymyalgia rheumatica ([place]) 03/09/2008   Social History   Tobacco Use  . Smoking status: Never Smoker  . Smokeless tobacco: Never Used  Substance Use Topics  . Alcohol use: No  . Drug use: No   Current Medications and Allergies:   Current Outpatient Medications:  .  apixaban (ELIQUIS) 2.5 MG TABS tablet, Take 1 tablet (2.5 mg total) by mouth 2 (two) times daily., Disp: 60 tablet, Rfl: 0 .  furosemide (LASIX) 20 MG tablet, Take 1 tablet (20 mg total) by mouth every other day as needed for fluid or edema., Disp: 30 tablet, Rfl: 2 .  Lido-Capsaicin-Men-Methyl Sal (MEDI-PATCH-LIDOCAINE) 0.5-0.035-5-20 % PTCH, Place patch on area every 12 hours for pian relief., Disp: 30 patch, Rfl: 2 .  mirtazapine (REMERON) 15 MG tablet, Take half tablet at bedtime., Disp: 30 tablet, Rfl: 1 .  pantoprazole (PROTONIX) 40 MG tablet, Take 1 tablet (40 mg total) by mouth daily., Disp: 30 tablet, Rfl: 5 .  polyvinyl alcohol (LIQUIFILM TEARS) 1.4 % ophthalmic solution, Place 1 drop into both eyes as needed for dry eyes., Disp: 15 mL, Rfl: 0 .  ropinirole (REQUIP) 5 MG tablet, Take 1 tablet (5 mg total) by mouth at bedtime., Disp: 30 tablet, Rfl: 1 .  triamcinolone ointment (KENALOG) 0.5 %, Apply 1 application topically 2 (two) times daily as needed (irritation,rash)., Disp: 30 g, Rfl: 0  Allergies  Allergen Reactions  . Sulfa Antibiotics Nausea And Vomiting  . Penicillins Hives and Rash    ++tolerates Ceftriaxone and keflex++ Has patient had a PCN reaction causing immediate rash, facial/tongue/throat swelling, SOB or lightheadedness with hypotension: Unknown Has patient had a PCN  reaction causing severe rash involving mucus membranes or skin necrosis: Unknown Has patient had a PCN reaction that required hospitalization Unknown Has patient had a PCN reaction occurring within the last 10 years: No If all of the above answers are "NO", then may proceed with Cephalosporin use.    Review of Systems   Pertinent items are noted in the HPI. Otherwise, ROS is negative.  Vitals:  There were no vitals filed for this visit.   There is no height or weight on file to calculate BMI. Physical Exam:   Physical Exam Results for orders placed or performed during the hospital encounter of 12/24/17  Urine culture  Result Value Ref Range   Specimen Description URINE, CLEAN CATCH    Special Requests NONE    Culture MULTIPLE SPECIES PRESENT, SUGGEST RECOLLECTION (A)    Report Status 12/26/2017 FINAL   Basic metabolic panel  Result Value Ref Range   Sodium 139 135 - 145 mmol/L   Potassium 4.1 3.5 - 5.1 mmol/L   Chloride 106 101 - 111 mmol/L   CO2 26 22 - 32 mmol/L   Glucose, Bld 127 (H) 65 - 99 mg/dL   BUN 29 (H) 6 - 20 mg/dL   Creatinine, Ser 1.41 (H) 0.44 - 1.00 mg/dL   Calcium 9.2 8.9 - 10.3 mg/dL   GFR calc non Af Amer 30 (L) >60 mL/min   GFR calc Af Amer 35 (L) >60 mL/min   Anion gap 7 5 - 15  CBC with Differential  Result Value Ref Range   WBC 6.6 4.0 - 10.5 K/uL   RBC 3.87 3.87 - 5.11 MIL/uL   Hemoglobin 11.5 (L) 12.0 - 15.0 g/dL   HCT 35.3 (L) 36.0 - 46.0 %   MCV 91.2 78.0 - 100.0 fL   MCH 29.7 26.0 - 34.0 pg   MCHC 32.6 30.0 - 36.0 g/dL   RDW 13.8 11.5 - 15.5 %   Platelets 215 150 - 400 K/uL   Neutrophils Relative % 48 %   Neutro Abs 3.2 1.7 - 7.7 K/uL   Lymphocytes Relative 39 %   Lymphs Abs 2.6 0.7 - 4.0 K/uL   Monocytes Relative 8 %   Monocytes Absolute 0.5 0.1 - 1.0 K/uL   Eosinophils Relative 4 %   Eosinophils Absolute 0.3 0.0 - 0.7 K/uL   Basophils Relative 1 %   Basophils Absolute 0.0 0.0 - 0.1 K/uL  Urinalysis, Routine w reflex microscopic    Result Value Ref Range   Color, Urine YELLOW YELLOW   APPearance CLEAR CLEAR   Specific Gravity, Urine >1.030 (H) 1.005 - 1.030   pH 5.5 5.0 - 8.0   Glucose, UA NEGATIVE NEGATIVE mg/dL   Hgb urine dipstick NEGATIVE NEGATIVE   Bilirubin Urine SMALL (A) NEGATIVE   Ketones, ur NEGATIVE NEGATIVE mg/dL   Protein, ur NEGATIVE NEGATIVE mg/dL   Nitrite NEGATIVE NEGATIVE   Leukocytes, UA NEGATIVE NEGATIVE    Assessment and Plan:   There are no diagnoses linked to this encounter.  . Reviewed expectations re: course of current medical issues. . Discussed self-management of  symptoms. . Outlined signs and symptoms indicating need for more acute intervention. . Patient verbalized understanding and all questions were answered. Marland Kitchen Health Maintenance issues including appropriate healthy diet, exercise, and smoking avoidance were discussed with patient. . See orders for this visit as documented in the electronic medical record. . Patient received an After Visit Summary.  Briscoe Deutscher, DO [place], [facility] 03/19/2018  Future Appointments  Date Time Provider [place]  03/19/2018  3:00 PM Briscoe Deutscher, DO [facility]

## 2018-03-22 DIAGNOSIS — I10 Essential (primary) hypertension: Secondary | ICD-10-CM | POA: Diagnosis not present

## 2018-03-22 DIAGNOSIS — E46 Unspecified protein-calorie malnutrition: Secondary | ICD-10-CM | POA: Diagnosis not present

## 2018-03-22 DIAGNOSIS — R630 Anorexia: Secondary | ICD-10-CM | POA: Diagnosis not present

## 2018-03-22 DIAGNOSIS — I4891 Unspecified atrial fibrillation: Secondary | ICD-10-CM | POA: Diagnosis not present

## 2018-03-22 DIAGNOSIS — E119 Type 2 diabetes mellitus without complications: Secondary | ICD-10-CM | POA: Diagnosis not present

## 2018-03-22 DIAGNOSIS — F0280 Dementia in other diseases classified elsewhere without behavioral disturbance: Secondary | ICD-10-CM | POA: Diagnosis not present

## 2018-03-23 DIAGNOSIS — F0280 Dementia in other diseases classified elsewhere without behavioral disturbance: Secondary | ICD-10-CM | POA: Diagnosis not present

## 2018-03-23 DIAGNOSIS — I10 Essential (primary) hypertension: Secondary | ICD-10-CM | POA: Diagnosis not present

## 2018-03-23 DIAGNOSIS — I4891 Unspecified atrial fibrillation: Secondary | ICD-10-CM | POA: Diagnosis not present

## 2018-03-23 DIAGNOSIS — E46 Unspecified protein-calorie malnutrition: Secondary | ICD-10-CM | POA: Diagnosis not present

## 2018-03-23 DIAGNOSIS — R630 Anorexia: Secondary | ICD-10-CM | POA: Diagnosis not present

## 2018-03-23 DIAGNOSIS — E119 Type 2 diabetes mellitus without complications: Secondary | ICD-10-CM | POA: Diagnosis not present

## 2018-03-24 ENCOUNTER — Encounter: Payer: Self-pay | Admitting: Family Medicine

## 2018-03-25 DIAGNOSIS — I4891 Unspecified atrial fibrillation: Secondary | ICD-10-CM | POA: Diagnosis not present

## 2018-03-25 DIAGNOSIS — F0280 Dementia in other diseases classified elsewhere without behavioral disturbance: Secondary | ICD-10-CM | POA: Diagnosis not present

## 2018-03-25 DIAGNOSIS — E119 Type 2 diabetes mellitus without complications: Secondary | ICD-10-CM | POA: Diagnosis not present

## 2018-03-25 DIAGNOSIS — R630 Anorexia: Secondary | ICD-10-CM | POA: Diagnosis not present

## 2018-03-25 DIAGNOSIS — E46 Unspecified protein-calorie malnutrition: Secondary | ICD-10-CM | POA: Diagnosis not present

## 2018-03-25 DIAGNOSIS — I10 Essential (primary) hypertension: Secondary | ICD-10-CM | POA: Diagnosis not present

## 2018-03-27 ENCOUNTER — Other Ambulatory Visit: Payer: Self-pay | Admitting: Family Medicine

## 2018-03-27 DIAGNOSIS — M1710 Unilateral primary osteoarthritis, unspecified knee: Secondary | ICD-10-CM

## 2018-03-27 DIAGNOSIS — M179 Osteoarthritis of knee, unspecified: Secondary | ICD-10-CM

## 2018-03-30 DIAGNOSIS — I4891 Unspecified atrial fibrillation: Secondary | ICD-10-CM | POA: Diagnosis not present

## 2018-03-30 DIAGNOSIS — R630 Anorexia: Secondary | ICD-10-CM | POA: Diagnosis not present

## 2018-03-30 DIAGNOSIS — I10 Essential (primary) hypertension: Secondary | ICD-10-CM | POA: Diagnosis not present

## 2018-03-30 DIAGNOSIS — E119 Type 2 diabetes mellitus without complications: Secondary | ICD-10-CM | POA: Diagnosis not present

## 2018-03-30 DIAGNOSIS — E46 Unspecified protein-calorie malnutrition: Secondary | ICD-10-CM | POA: Diagnosis not present

## 2018-03-30 DIAGNOSIS — F0280 Dementia in other diseases classified elsewhere without behavioral disturbance: Secondary | ICD-10-CM | POA: Diagnosis not present

## 2018-03-31 NOTE — Telephone Encounter (Signed)
Please advise 

## 2018-04-01 DIAGNOSIS — R630 Anorexia: Secondary | ICD-10-CM | POA: Diagnosis not present

## 2018-04-01 DIAGNOSIS — F0280 Dementia in other diseases classified elsewhere without behavioral disturbance: Secondary | ICD-10-CM | POA: Diagnosis not present

## 2018-04-01 DIAGNOSIS — I4891 Unspecified atrial fibrillation: Secondary | ICD-10-CM | POA: Diagnosis not present

## 2018-04-01 DIAGNOSIS — E119 Type 2 diabetes mellitus without complications: Secondary | ICD-10-CM | POA: Diagnosis not present

## 2018-04-01 DIAGNOSIS — I10 Essential (primary) hypertension: Secondary | ICD-10-CM | POA: Diagnosis not present

## 2018-04-01 DIAGNOSIS — E46 Unspecified protein-calorie malnutrition: Secondary | ICD-10-CM | POA: Diagnosis not present

## 2018-04-06 ENCOUNTER — Telehealth: Payer: Self-pay | Admitting: Family Medicine

## 2018-04-06 DIAGNOSIS — F0280 Dementia in other diseases classified elsewhere without behavioral disturbance: Secondary | ICD-10-CM | POA: Diagnosis not present

## 2018-04-06 DIAGNOSIS — I4891 Unspecified atrial fibrillation: Secondary | ICD-10-CM | POA: Diagnosis not present

## 2018-04-06 DIAGNOSIS — E46 Unspecified protein-calorie malnutrition: Secondary | ICD-10-CM | POA: Diagnosis not present

## 2018-04-06 DIAGNOSIS — I10 Essential (primary) hypertension: Secondary | ICD-10-CM | POA: Diagnosis not present

## 2018-04-06 DIAGNOSIS — R630 Anorexia: Secondary | ICD-10-CM | POA: Diagnosis not present

## 2018-04-06 DIAGNOSIS — E119 Type 2 diabetes mellitus without complications: Secondary | ICD-10-CM | POA: Diagnosis not present

## 2018-04-06 NOTE — Telephone Encounter (Signed)
Copied from [place] [id number]. Topic: Quick Communication - Rx Refill/Question >> Apr 06, 2018  8:34 AM Scherrie Gerlach wrote: Medication: ropinirole (REQUIP) 5 MG tablet Has the patient contacted their pharmacy? no [facility] calling to request this refill   [place], [place] [phone] (Phone) [phone] (Fax)

## 2018-04-07 ENCOUNTER — Other Ambulatory Visit: Payer: Self-pay | Admitting: Family Medicine

## 2018-04-07 DIAGNOSIS — H04123 Dry eye syndrome of bilateral lacrimal glands: Secondary | ICD-10-CM | POA: Diagnosis not present

## 2018-04-07 DIAGNOSIS — D649 Anemia, unspecified: Secondary | ICD-10-CM | POA: Diagnosis not present

## 2018-04-07 DIAGNOSIS — I10 Essential (primary) hypertension: Secondary | ICD-10-CM | POA: Diagnosis not present

## 2018-04-07 DIAGNOSIS — F0280 Dementia in other diseases classified elsewhere without behavioral disturbance: Secondary | ICD-10-CM | POA: Diagnosis not present

## 2018-04-07 DIAGNOSIS — R630 Anorexia: Secondary | ICD-10-CM | POA: Diagnosis not present

## 2018-04-07 DIAGNOSIS — K219 Gastro-esophageal reflux disease without esophagitis: Secondary | ICD-10-CM | POA: Diagnosis not present

## 2018-04-07 DIAGNOSIS — R609 Edema, unspecified: Secondary | ICD-10-CM | POA: Diagnosis not present

## 2018-04-07 DIAGNOSIS — G619 Inflammatory polyneuropathy, unspecified: Secondary | ICD-10-CM | POA: Diagnosis not present

## 2018-04-07 DIAGNOSIS — I4891 Unspecified atrial fibrillation: Secondary | ICD-10-CM | POA: Diagnosis not present

## 2018-04-07 DIAGNOSIS — E039 Hypothyroidism, unspecified: Secondary | ICD-10-CM | POA: Diagnosis not present

## 2018-04-07 DIAGNOSIS — E46 Unspecified protein-calorie malnutrition: Secondary | ICD-10-CM | POA: Diagnosis not present

## 2018-04-07 DIAGNOSIS — G2581 Restless legs syndrome: Secondary | ICD-10-CM | POA: Diagnosis not present

## 2018-04-07 DIAGNOSIS — N3289 Other specified disorders of bladder: Secondary | ICD-10-CM | POA: Diagnosis not present

## 2018-04-07 DIAGNOSIS — E119 Type 2 diabetes mellitus without complications: Secondary | ICD-10-CM | POA: Diagnosis not present

## 2018-04-07 DIAGNOSIS — E785 Hyperlipidemia, unspecified: Secondary | ICD-10-CM | POA: Diagnosis not present

## 2018-04-07 DIAGNOSIS — F339 Major depressive disorder, recurrent, unspecified: Secondary | ICD-10-CM | POA: Diagnosis not present

## 2018-04-07 DIAGNOSIS — I82503 Chronic embolism and thrombosis of unspecified deep veins of lower extremity, bilateral: Secondary | ICD-10-CM | POA: Diagnosis not present

## 2018-04-07 MED ORDER — ROPINIROLE HCL 5 MG PO TABS: 5.0000 mg | ORAL_TABLET | Freq: Every day | ORAL | 1 refills | Status: DC

## 2018-04-07 NOTE — Telephone Encounter (Signed)
Please advise on refill.

## 2018-04-07 NOTE — Telephone Encounter (Signed)
Okay 

## 2018-04-07 NOTE — Telephone Encounter (Signed)
Prescription sent to the pharmacy.

## 2018-04-08 ENCOUNTER — Telehealth: Payer: Self-pay | Admitting: Family Medicine

## 2018-04-08 DIAGNOSIS — E46 Unspecified protein-calorie malnutrition: Secondary | ICD-10-CM | POA: Diagnosis not present

## 2018-04-08 DIAGNOSIS — E119 Type 2 diabetes mellitus without complications: Secondary | ICD-10-CM | POA: Diagnosis not present

## 2018-04-08 DIAGNOSIS — R630 Anorexia: Secondary | ICD-10-CM | POA: Diagnosis not present

## 2018-04-08 DIAGNOSIS — I10 Essential (primary) hypertension: Secondary | ICD-10-CM | POA: Diagnosis not present

## 2018-04-08 DIAGNOSIS — I4891 Unspecified atrial fibrillation: Secondary | ICD-10-CM | POA: Diagnosis not present

## 2018-04-08 DIAGNOSIS — F0280 Dementia in other diseases classified elsewhere without behavioral disturbance: Secondary | ICD-10-CM | POA: Diagnosis not present

## 2018-04-08 NOTE — Telephone Encounter (Signed)
Pt has not been in office as directed c/a last app. Do you want me to try auth?

## 2018-04-08 NOTE — Telephone Encounter (Signed)
Copied from [place] [id number]. Topic: Quick Communication - See Telephone Encounter >> Apr 08, 2018 12:32 PM Rutherford Nail, NT wrote: CRM for notification. See Telephone encounter for: 04/08/18. Dee calling and states that the patient has 3 lidocaine (LIDODERM) 5 % patched left and would like more sent to the pharmacy. I informed her that 30 patches were sent to the pharmacy on 04/01/18 with 1 refill. She states that she spoke with the pharmacy and they are not able to fill the refill order because there is not a Prior authorization completed. Please advise. CB#: [phone] ext [phone]

## 2018-04-09 NOTE — Telephone Encounter (Signed)
Needs appt

## 2018-04-09 NOTE — Telephone Encounter (Signed)
Called and let them know she needs app. Will call back if any problems.

## 2018-04-13 DIAGNOSIS — R630 Anorexia: Secondary | ICD-10-CM | POA: Diagnosis not present

## 2018-04-13 DIAGNOSIS — I4891 Unspecified atrial fibrillation: Secondary | ICD-10-CM | POA: Diagnosis not present

## 2018-04-13 DIAGNOSIS — E119 Type 2 diabetes mellitus without complications: Secondary | ICD-10-CM | POA: Diagnosis not present

## 2018-04-13 DIAGNOSIS — I10 Essential (primary) hypertension: Secondary | ICD-10-CM | POA: Diagnosis not present

## 2018-04-13 DIAGNOSIS — E46 Unspecified protein-calorie malnutrition: Secondary | ICD-10-CM | POA: Diagnosis not present

## 2018-04-13 DIAGNOSIS — F0280 Dementia in other diseases classified elsewhere without behavioral disturbance: Secondary | ICD-10-CM | POA: Diagnosis not present

## 2018-04-15 DIAGNOSIS — I10 Essential (primary) hypertension: Secondary | ICD-10-CM | POA: Diagnosis not present

## 2018-04-15 DIAGNOSIS — F0280 Dementia in other diseases classified elsewhere without behavioral disturbance: Secondary | ICD-10-CM | POA: Diagnosis not present

## 2018-04-15 DIAGNOSIS — I4891 Unspecified atrial fibrillation: Secondary | ICD-10-CM | POA: Diagnosis not present

## 2018-04-15 DIAGNOSIS — E119 Type 2 diabetes mellitus without complications: Secondary | ICD-10-CM | POA: Diagnosis not present

## 2018-04-15 DIAGNOSIS — E46 Unspecified protein-calorie malnutrition: Secondary | ICD-10-CM | POA: Diagnosis not present

## 2018-04-15 DIAGNOSIS — R630 Anorexia: Secondary | ICD-10-CM | POA: Diagnosis not present

## 2018-04-16 DIAGNOSIS — R630 Anorexia: Secondary | ICD-10-CM | POA: Diagnosis not present

## 2018-04-16 DIAGNOSIS — E46 Unspecified protein-calorie malnutrition: Secondary | ICD-10-CM | POA: Diagnosis not present

## 2018-04-16 DIAGNOSIS — I10 Essential (primary) hypertension: Secondary | ICD-10-CM | POA: Diagnosis not present

## 2018-04-16 DIAGNOSIS — F0280 Dementia in other diseases classified elsewhere without behavioral disturbance: Secondary | ICD-10-CM | POA: Diagnosis not present

## 2018-04-16 DIAGNOSIS — E119 Type 2 diabetes mellitus without complications: Secondary | ICD-10-CM | POA: Diagnosis not present

## 2018-04-16 DIAGNOSIS — I4891 Unspecified atrial fibrillation: Secondary | ICD-10-CM | POA: Diagnosis not present

## 2018-04-22 DIAGNOSIS — F0280 Dementia in other diseases classified elsewhere without behavioral disturbance: Secondary | ICD-10-CM | POA: Diagnosis not present

## 2018-04-22 DIAGNOSIS — I10 Essential (primary) hypertension: Secondary | ICD-10-CM | POA: Diagnosis not present

## 2018-04-22 DIAGNOSIS — I4891 Unspecified atrial fibrillation: Secondary | ICD-10-CM | POA: Diagnosis not present

## 2018-04-22 DIAGNOSIS — E46 Unspecified protein-calorie malnutrition: Secondary | ICD-10-CM | POA: Diagnosis not present

## 2018-04-22 DIAGNOSIS — E119 Type 2 diabetes mellitus without complications: Secondary | ICD-10-CM | POA: Diagnosis not present

## 2018-04-22 DIAGNOSIS — R630 Anorexia: Secondary | ICD-10-CM | POA: Diagnosis not present

## 2018-04-23 ENCOUNTER — Other Ambulatory Visit: Payer: Self-pay

## 2018-04-23 ENCOUNTER — Telehealth: Payer: Self-pay | Admitting: Family Medicine

## 2018-04-23 DIAGNOSIS — F329 Major depressive disorder, single episode, unspecified: Secondary | ICD-10-CM

## 2018-04-23 MED ORDER — MIRTAZAPINE 15 MG PO TABS: ORAL_TABLET | ORAL | 1 refills | Status: DC

## 2018-04-23 NOTE — Telephone Encounter (Signed)
Copied from [place] [id number]. Topic: Quick Communication - Rx Refill/Question >> Apr 23, 2018  8:50 AM Lennox Solders wrote: Medication: remeron and asa 325 mg. Jule Ser  from [facility] is calling requesting refills Preferred Pharm[facility] [address]

## 2018-04-23 NOTE — Telephone Encounter (Signed)
Remeron refilled. [name] is not on medication list. Left a message for hospice nurse.

## 2018-04-30 DIAGNOSIS — I4891 Unspecified atrial fibrillation: Secondary | ICD-10-CM | POA: Diagnosis not present

## 2018-04-30 DIAGNOSIS — E119 Type 2 diabetes mellitus without complications: Secondary | ICD-10-CM | POA: Diagnosis not present

## 2018-04-30 DIAGNOSIS — E46 Unspecified protein-calorie malnutrition: Secondary | ICD-10-CM | POA: Diagnosis not present

## 2018-04-30 DIAGNOSIS — I10 Essential (primary) hypertension: Secondary | ICD-10-CM | POA: Diagnosis not present

## 2018-04-30 DIAGNOSIS — F0280 Dementia in other diseases classified elsewhere without behavioral disturbance: Secondary | ICD-10-CM | POA: Diagnosis not present

## 2018-04-30 DIAGNOSIS — R630 Anorexia: Secondary | ICD-10-CM | POA: Diagnosis not present

## 2018-05-07 DIAGNOSIS — I4891 Unspecified atrial fibrillation: Secondary | ICD-10-CM | POA: Diagnosis not present

## 2018-05-07 DIAGNOSIS — F0280 Dementia in other diseases classified elsewhere without behavioral disturbance: Secondary | ICD-10-CM | POA: Diagnosis not present

## 2018-05-07 DIAGNOSIS — E46 Unspecified protein-calorie malnutrition: Secondary | ICD-10-CM | POA: Diagnosis not present

## 2018-05-07 DIAGNOSIS — R630 Anorexia: Secondary | ICD-10-CM | POA: Diagnosis not present

## 2018-05-07 DIAGNOSIS — E119 Type 2 diabetes mellitus without complications: Secondary | ICD-10-CM | POA: Diagnosis not present

## 2018-05-07 DIAGNOSIS — I10 Essential (primary) hypertension: Secondary | ICD-10-CM | POA: Diagnosis not present

## 2018-05-08 DIAGNOSIS — I4891 Unspecified atrial fibrillation: Secondary | ICD-10-CM | POA: Diagnosis not present

## 2018-05-08 DIAGNOSIS — I82503 Chronic embolism and thrombosis of unspecified deep veins of lower extremity, bilateral: Secondary | ICD-10-CM | POA: Diagnosis not present

## 2018-05-08 DIAGNOSIS — E039 Hypothyroidism, unspecified: Secondary | ICD-10-CM | POA: Diagnosis not present

## 2018-05-08 DIAGNOSIS — R609 Edema, unspecified: Secondary | ICD-10-CM | POA: Diagnosis not present

## 2018-05-08 DIAGNOSIS — G2581 Restless legs syndrome: Secondary | ICD-10-CM | POA: Diagnosis not present

## 2018-05-08 DIAGNOSIS — K219 Gastro-esophageal reflux disease without esophagitis: Secondary | ICD-10-CM | POA: Diagnosis not present

## 2018-05-08 DIAGNOSIS — E785 Hyperlipidemia, unspecified: Secondary | ICD-10-CM | POA: Diagnosis not present

## 2018-05-08 DIAGNOSIS — F0280 Dementia in other diseases classified elsewhere without behavioral disturbance: Secondary | ICD-10-CM | POA: Diagnosis not present

## 2018-05-08 DIAGNOSIS — I10 Essential (primary) hypertension: Secondary | ICD-10-CM | POA: Diagnosis not present

## 2018-05-08 DIAGNOSIS — D649 Anemia, unspecified: Secondary | ICD-10-CM | POA: Diagnosis not present

## 2018-05-08 DIAGNOSIS — R630 Anorexia: Secondary | ICD-10-CM | POA: Diagnosis not present

## 2018-05-08 DIAGNOSIS — G619 Inflammatory polyneuropathy, unspecified: Secondary | ICD-10-CM | POA: Diagnosis not present

## 2018-05-08 DIAGNOSIS — H04123 Dry eye syndrome of bilateral lacrimal glands: Secondary | ICD-10-CM | POA: Diagnosis not present

## 2018-05-08 DIAGNOSIS — N3289 Other specified disorders of bladder: Secondary | ICD-10-CM | POA: Diagnosis not present

## 2018-05-08 DIAGNOSIS — E46 Unspecified protein-calorie malnutrition: Secondary | ICD-10-CM | POA: Diagnosis not present

## 2018-05-08 DIAGNOSIS — F339 Major depressive disorder, recurrent, unspecified: Secondary | ICD-10-CM | POA: Diagnosis not present

## 2018-05-08 DIAGNOSIS — E119 Type 2 diabetes mellitus without complications: Secondary | ICD-10-CM | POA: Diagnosis not present

## 2018-05-14 DIAGNOSIS — I10 Essential (primary) hypertension: Secondary | ICD-10-CM | POA: Diagnosis not present

## 2018-05-14 DIAGNOSIS — E46 Unspecified protein-calorie malnutrition: Secondary | ICD-10-CM | POA: Diagnosis not present

## 2018-05-14 DIAGNOSIS — E119 Type 2 diabetes mellitus without complications: Secondary | ICD-10-CM | POA: Diagnosis not present

## 2018-05-14 DIAGNOSIS — I4891 Unspecified atrial fibrillation: Secondary | ICD-10-CM | POA: Diagnosis not present

## 2018-05-14 DIAGNOSIS — F0280 Dementia in other diseases classified elsewhere without behavioral disturbance: Secondary | ICD-10-CM | POA: Diagnosis not present

## 2018-05-14 DIAGNOSIS — R630 Anorexia: Secondary | ICD-10-CM | POA: Diagnosis not present

## 2018-05-17 ENCOUNTER — Ambulatory Visit: Payer: Self-pay | Admitting: *Deleted

## 2018-05-17 ENCOUNTER — Encounter: Payer: Self-pay | Admitting: Family Medicine

## 2018-05-17 ENCOUNTER — Ambulatory Visit (INDEPENDENT_AMBULATORY_CARE_PROVIDER_SITE_OTHER): Payer: Medicare Other | Admitting: Family Medicine

## 2018-05-17 VITALS — BP 118/88 | Temp 97.8°F

## 2018-05-17 DIAGNOSIS — Z66 Do not resuscitate: Secondary | ICD-10-CM

## 2018-05-17 DIAGNOSIS — Z86718 Personal history of other venous thrombosis and embolism: Secondary | ICD-10-CM

## 2018-05-17 DIAGNOSIS — R350 Frequency of micturition: Secondary | ICD-10-CM

## 2018-05-17 DIAGNOSIS — E440 Moderate protein-calorie malnutrition: Secondary | ICD-10-CM

## 2018-05-17 DIAGNOSIS — R5381 Other malaise: Secondary | ICD-10-CM | POA: Diagnosis not present

## 2018-05-17 DIAGNOSIS — I10 Essential (primary) hypertension: Secondary | ICD-10-CM

## 2018-05-17 LAB — URINALYSIS, ROUTINE W REFLEX MICROSCOPIC
Bilirubin Urine: NEGATIVE
Hgb urine dipstick: NEGATIVE
Ketones, ur: NEGATIVE
Nitrite: NEGATIVE
Specific Gravity, Urine: 1.015 (ref 1.000–1.030)
Total Protein, Urine: NEGATIVE
Urine Glucose: NEGATIVE
Urobilinogen, UA: 0.2 (ref 0.0–1.0)
pH: 5.5 (ref 5.0–8.0)

## 2018-05-17 LAB — COMPREHENSIVE METABOLIC PANEL
ALT: 13 U/L (ref 0–35)
AST: 24 U/L (ref 0–37)
Albumin: 4.2 g/dL (ref 3.5–5.2)
Alkaline Phosphatase: 137 U/L — ABNORMAL HIGH (ref 39–117)
BUN: 56 mg/dL — ABNORMAL HIGH (ref 6–23)
CO2: 27 mEq/L (ref 19–32)
Calcium: 9.6 mg/dL (ref 8.4–10.5)
Chloride: 100 mEq/L (ref 96–112)
Creatinine, Ser: 1.86 mg/dL — ABNORMAL HIGH (ref 0.40–1.20)
GFR: 26.65 mL/min — ABNORMAL LOW (ref >60.00)
Glucose, Bld: 129 mg/dL — ABNORMAL HIGH (ref 70–99)
Potassium: 3.8 mEq/L (ref 3.5–5.1)
Sodium: 138 mEq/L (ref 135–145)
Total Bilirubin: 0.4 mg/dL (ref 0.2–1.2)
Total Protein: 7.3 g/dL (ref 6.0–8.3)

## 2018-05-17 LAB — CBC WITH DIFFERENTIAL/PLATELET
Basophils Absolute: 0 10*3/uL (ref 0.0–0.1)
Basophils Relative: 0.8 % (ref 0.0–3.0)
Eosinophils Absolute: 0.2 10*3/uL (ref 0.0–0.7)
Eosinophils Relative: 2.9 % (ref 0.0–5.0)
HCT: 36 % (ref 36.0–46.0)
Hemoglobin: 12 g/dL (ref 12.0–15.0)
Lymphocytes Relative: 45.9 % (ref 12.0–46.0)
Lymphs Abs: 2.6 10*3/uL (ref 0.7–4.0)
MCHC: 33.5 g/dL (ref 30.0–36.0)
MCV: 90.3 fl (ref 78.0–100.0)
Monocytes Absolute: 0.3 10*3/uL (ref 0.1–1.0)
Monocytes Relative: 5.6 % (ref 3.0–12.0)
Neutro Abs: 2.6 10*3/uL (ref 1.4–7.7)
Neutrophils Relative %: 44.8 % (ref 43.0–77.0)
Platelets: 199 10*3/uL (ref 150.0–400.0)
RBC: 3.99 Mil/uL (ref 3.87–5.11)
RDW: 14 % (ref 11.5–15.5)
WBC: 5.7 10*3/uL (ref 4.0–10.5)

## 2018-05-17 NOTE — Telephone Encounter (Signed)
Patient has a Left Leg Cramp, and she does not feeling well in general. Patient has an appt for today, but seeks the advice of a nurse.    Thank You!!!   Patient states she has swelling in her left leg that is not going away- she had very bad pain in the leg that woke her from sleep this morning. She has used pain relief that has helped. The swelling is not better- she does have an appointment this afternoon. Her main concern is the development of another DVT. Reason for Disposition . [1] Thigh or calf pain AND [2] only 1 side AND [3] present > 1 hour  Answer Assessment - Initial Assessment Questions 1. ONSET: "When did the pain start?"      Pain in leg this morning- cramp woke her up- patient put cream on it and it seemed to help 2. LOCATION: "Where is the pain located?"      Above the ankle- 4 inches above 3. PAIN: "How bad is the pain?"    (Scale 1-10; or mild, moderate, severe)   -  MILD (1-3): doesn't interfere with normal activities    -  MODERATE (4-7): interferes with normal activities (e.g., work or school) or awakens from sleep, limping    -  SEVERE (8-10): excruciating pain, unable to do any normal activities, unable to walk     severe 4. WORK OR EXERCISE: "Has there been any recent work or exercise that involved this part of the body?"      No- patient states it hits her out of the blue 5. CAUSE: "What do you think is causing the leg pain?"     No idea- she has had clots in that leg before 6. OTHER SYMPTOMS: "Do you have any other symptoms?" (e.g., chest pain, back pain, breathing difficulty, swelling, rash, fever, numbness, weakness)     Leg in swollen- but not hurting 7. PREGNANCY: "Is there any chance you are pregnant?" "When was your last menstrual period?"     n/a  Protocols used: LEG PAIN-A-AH

## 2018-05-17 NOTE — Telephone Encounter (Signed)
See note

## 2018-05-17 NOTE — Progress Notes (Signed)
[name] is a 84 y.o. female is here for follow up.  History of Present Illness:   Shaune Pascal CMA acting as scribe for Dr. Juleen China.  HPI: Patient comes in today for a follow up.   Energy: Patient states that she has no energy.   Left Leg Cramp: Patient states that she had a leg cramp last night that woke her up. She said that she had to message her leg. Patient does have a history of blood clots in both legs. Patient stated that when she wakes up her pain is so bad that she screams. She does get up and move around the house. Concern today is that she has a blood clot. We will order an ultra sound today to check for DVT.   Urination: Patient states that she goes to the bathroom all the time. She states that when she goes to the bathroom her right hand goes numb.  Health Maintenance Due  Topic Date Due  . OPHTHALMOLOGY EXAM  07[birth year]1931  . TETANUS/TDAP  07/23/1940  . DEXA SCAN  07/23/1986  . PNA vac Low Risk Adult (1 of 2 - PCV13) 07/23/1986  . HEMOGLOBIN A1C  03/22/2018  . URINE MICROALBUMIN  04/11/2018   Depression screen PHQ 2/9 11/03/2017  Decreased Interest 0  Down, Depressed, Hopeless 0  PHQ - 2 Score 0   PMHx, SurgHx, SocialHx, FamHx, Medications, and Allergies were reviewed in the Visit Navigator and updated as appropriate.   Patient Active Problem List   Diagnosis Date Noted  . Auditory hallucinations 12/25/2017  . Altered mental status   . DNR (do not resuscitate) 12/12/2017  . CKD (chronic kidney disease), stage III ([place]) 11/22/2017  . Hypertension 11/22/2017  . DVT (deep vein thrombosis) in pregnancy ([place]) 11/22/2017  . Atrial fibrillation, chronic ([place]) 11/22/2017  . Depression 11/22/2017  . Protein-calorie malnutrition, moderate ([place]) 11/22/2017  . Dry skin dermatitis 09/24/2017  . Osteoarthritis 07/31/2017  . CKD stage 4 due to type 2 diabetes mellitus ([place]) 04/19/2017  . Iron deficiency anemia 04/19/2017  . B12 deficiency 04/08/2017  . Chronic  constipation 04/08/2017  . Chronic deep vein thrombosis (DVT) of left lower extremity ([place]) 04/08/2017  . Episode of recurrent major depressive disorder ([place]) 04/08/2017  . Monoclonal gammopathy of unknown significance (MGUS) 04/08/2017  . Peripheral neuropathy 04/08/2017  . Rectal mass   . Abdominal pain 06/12/2015  . UTI (urinary tract infection) 03/02/2015  . Pancreas divisum 03/02/2015  . Physical deconditioning 03/01/2015  . Nausea vomiting and diarrhea   . Pancreatic cyst   . Hiatal hernia- moderate to large 10/06/2014  . Heme + stool 10/04/2014  . Crohn's ileocolitis ([place]) 10/04/2014  . 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 Arthritis   . Hx pulmonary embolism   . Hypothyroidism 04/11/2014  . Type 2 diabetes mellitus with stage 3 chronic kidney disease ([place]) 04/11/2014  . Anemia of chronic disease 04/03/2014  . OAB (overactive bladder) 01/07/2012  . Restless leg syndrome 03/09/2008  . Benign essential hypertension 03/09/2008  . ESOPHAGEAL STRICTURE 03/09/2008  . GERD (gastroesophageal reflux disease) 03/09/2008  . Diverticulosis of large intestine 03/09/2008  . Polymyalgia rheumatica ([place]) 03/09/2008   Social History   Tobacco Use  . Smoking status: Never Smoker  . Smokeless tobacco: Never Used  Substance Use Topics  . Alcohol use: No  . Drug use: No   Current Medications and Allergies:   .  furosemide (LASIX) 20 MG tablet, Take 1 tablet (20 mg total) by mouth every  other day as needed for fluid or edema., Disp: 30 tablet, Rfl: 2 .  lidocaine (LIDODERM) 5 %, PLACE 1 PATCH ON AFFECTED AREA EVERY 12 HOURS FOR PAIN RELIEF, Disp: 30 patch, Rfl: 1 .  mirtazapine (REMERON) 15 MG tablet, Take half tablet at bedtime., Disp: 30 tablet, Rfl: 1 .  pantoprazole (PROTONIX) 40 MG tablet, Take 1 tablet (40 mg total) by mouth daily., Disp: 30 tablet, Rfl: 5 .  ropinirole (REQUIP) 5 MG tablet, Take 1 tablet (5 mg total) by mouth at bedtime., Disp: 30 tablet, Rfl: 1 .  polyvinyl alcohol  (LIQUIFILM TEARS) 1.4 % ophthalmic solution, Place 1 drop into both eyes as needed for dry eyes., Disp: 15 mL, Rfl: 0 .  triamcinolone ointment (KENALOG) 0.5 %, Apply 1 application topically 2 (two) times daily as needed (irritation,rash)., Disp: 30 g, Rfl: 0   Allergies  Allergen Reactions  . Sulfa Antibiotics Nausea And Vomiting  . Penicillins Hives and Rash    ++tolerates Ceftriaxone and keflex++ Has patient had a PCN reaction causing immediate rash, facial/tongue/throat swelling, SOB or lightheadedness with hypotension: Unknown Has patient had a PCN reaction causing severe rash involving mucus membranes or skin necrosis: Unknown Has patient had a PCN reaction that required hospitalization Unknown Has patient had a PCN reaction occurring within the last 10 years: No If all of the above answers are "NO", then may proceed with Cephalosporin use.    Review of Systems   Pertinent items are noted in the HPI. Otherwise, ROS is negative.  Vitals:   Vitals:   05/17/18 1400  BP: 118/88  Temp: 97.8 F (36.6 C)  TempSrc: Oral     There is no height or weight on file to calculate BMI.  Physical Exam:   Physical Exam  Constitutional: She appears well-nourished.  HENT:  Head: Normocephalic and atraumatic.  Eyes: Pupils are equal, round, and reactive to light. EOM are normal.  Neck: Normal range of motion. Neck supple.  Cardiovascular: Normal rate, regular rhythm, normal heart sounds and intact distal pulses.  Pulmonary/Chest: Effort normal.  Abdominal: Soft.  Skin: Skin is warm.  Psychiatric: She has a normal mood and affect. Her behavior is normal.  Nursing note and vitals reviewed.  Assessment and Plan:   Diagnoses and all orders for this visit:  Benign essential hypertension Comments: Controlled. Continue current treatment.  Orders: -     CBC with Differential/Platelet -     Comprehensive metabolic panel  Frequent urination -     Urinalysis, Routine w reflex  microscopic -     Urine Culture  History of DVT (deep vein thrombosis) Comments: Previously on Eliquis but stopped after Hospice initiated. Will restart to see if it helps the leg discomfort.   DNR (do not resuscitate)  Protein-calorie malnutrition, moderate ([place]) Comments: Discussed hydration and diet.  Physical deconditioning Comments: Will see if she is eligible for PT.     Marland Kitchen Reviewed expectations re: course of current medical issues. . Discussed self-management of symptoms. . Outlined signs and symptoms indicating need for more acute intervention. . Patient verbalized understanding and all questions were answered. Marland Kitchen Health Maintenance issues including appropriate healthy diet, exercise, and smoking avoidance were discussed with patient. . See orders for this visit as documented in the electronic medical record. . Patient received an After Visit Summary.  CMA served as Education administrator during this visit. History, Physical, and Plan performed by medical provider. The above documentation has been reviewed and is accurate and complete. Briscoe Deutscher, D.O.  Briscoe Deutscher, DO [place], [facility] 05/23/2018

## 2018-05-17 NOTE — Telephone Encounter (Signed)
Printed for app today

## 2018-05-18 ENCOUNTER — Other Ambulatory Visit: Payer: Self-pay

## 2018-05-18 ENCOUNTER — Telehealth: Payer: Self-pay | Admitting: Family Medicine

## 2018-05-18 DIAGNOSIS — E119 Type 2 diabetes mellitus without complications: Secondary | ICD-10-CM | POA: Diagnosis not present

## 2018-05-18 DIAGNOSIS — I4891 Unspecified atrial fibrillation: Secondary | ICD-10-CM | POA: Diagnosis not present

## 2018-05-18 DIAGNOSIS — I10 Essential (primary) hypertension: Secondary | ICD-10-CM | POA: Diagnosis not present

## 2018-05-18 DIAGNOSIS — F0280 Dementia in other diseases classified elsewhere without behavioral disturbance: Secondary | ICD-10-CM | POA: Diagnosis not present

## 2018-05-18 DIAGNOSIS — R630 Anorexia: Secondary | ICD-10-CM | POA: Diagnosis not present

## 2018-05-18 DIAGNOSIS — E46 Unspecified protein-calorie malnutrition: Secondary | ICD-10-CM | POA: Diagnosis not present

## 2018-05-18 MED ORDER — CEPHALEXIN 500 MG PO CAPS: 500.0000 mg | ORAL_CAPSULE | Freq: Two times a day (BID) | ORAL | 0 refills | Status: AC

## 2018-05-18 NOTE — Telephone Encounter (Signed)
Copied from [place] [id number]. Topic: Quick Communication - Rx Refill/Question >> May 18, 2018 11:27 AM Boyd Kerbs wrote: Medication:   apixaban (ELIQUIS) 2.5 MG TABS tablet  This was to be called in yesterday and have went by pharmacy several times.  (Neighbor called,  I called pt. Herself, neighbor is not on [employer])   She is asking about Aspirin,  is she suppose to take this?    Has the patient contacted their pharmacy? Yes.   (Agent: If no, request that the patient contact the pharmacy for the refill.) (Agent: If yes, when and what did the pharmacy advise?)  Preferred Pharmacy (with phone number or street name):   [facility], [place] [address] Phone: [phone] Fax: [phone]    Agent: Please be advised that RX refills may take up to 3 business days. We ask that you follow-up with your pharmacy.

## 2018-05-19 LAB — URINE CULTURE
MICRO NUMBER:: 90693518
SPECIMEN QUALITY:: ADEQUATE

## 2018-05-19 NOTE — Telephone Encounter (Signed)
I think in the visit you wanted the patient to start back the eliquis due to the history of DVT and she is having the leg pain again. Is she supposed to be taking the ASA?

## 2018-05-19 NOTE — Telephone Encounter (Signed)
Spoke with the patient and went over the instructions. Patient verbalized understanding that she should start back the Eliquis and stop the ASA.

## 2018-05-19 NOTE — Telephone Encounter (Signed)
Stop ASA. Eliquis only.

## 2018-05-19 NOTE — Telephone Encounter (Signed)
Phone call to pt. in response to message yesterday, for refill on the Eliquis.  Stated "I have not taken the Eliquis for at least 6 months."  Advised pt. that her neighbor called to request the Eliquis yesterday.  Pt. stated "I'm not sure about that."  Last refill on Eliquis was 12/14/17; #60; no refills.  Noted that in recent office note on 6/10, the medication sheet noted "not taking" on Eliquis. The pt. Reported her neighbor went to the pharmacy yesterday and brought back refill on the Eliquis and the Cephalexin.  Pt. Read the Rx bottle on Eliquis; reported the Rx was filled by Dr. Ward Givens, # 28, no refills on 05/18/18.  Stated that she is confused as to how Dr. Harrell Lark has filled this, when she hasn't gone to her since August of last year.  Pt. Is unsure if she is still supposed to take the Eliquis.  Also, questioned if she is supposed to take an Aspirin once / day.  Advised will send message to Dr. Juleen China to answer questions on resuming Eliquis and on taking daily ASA.  Agreed w/ plan.

## 2018-05-19 NOTE — Telephone Encounter (Signed)
See note

## 2018-05-21 DIAGNOSIS — E46 Unspecified protein-calorie malnutrition: Secondary | ICD-10-CM | POA: Diagnosis not present

## 2018-05-21 DIAGNOSIS — I10 Essential (primary) hypertension: Secondary | ICD-10-CM | POA: Diagnosis not present

## 2018-05-21 DIAGNOSIS — R630 Anorexia: Secondary | ICD-10-CM | POA: Diagnosis not present

## 2018-05-21 DIAGNOSIS — F0280 Dementia in other diseases classified elsewhere without behavioral disturbance: Secondary | ICD-10-CM | POA: Diagnosis not present

## 2018-05-21 DIAGNOSIS — I4891 Unspecified atrial fibrillation: Secondary | ICD-10-CM | POA: Diagnosis not present

## 2018-05-21 DIAGNOSIS — E119 Type 2 diabetes mellitus without complications: Secondary | ICD-10-CM | POA: Diagnosis not present

## 2018-05-24 ENCOUNTER — Telehealth: Payer: Self-pay | Admitting: Surgical

## 2018-05-24 NOTE — Telephone Encounter (Signed)
What will hospice allow? Coumadin? I doubt that the patient can make INR checks. Order [place] leg to confirm new clot. If positive, will discuss with patient and POA.

## 2018-05-24 NOTE — Telephone Encounter (Signed)
Spoke with [facility] nurse. She stated that [name] can't afford the Eliquis and Hospice does not pay for this. I explained that at the visit [name] was having the leg pain and her history of DVT. I told her that I would talk with Dr. Juleen China to determine what she would like to do.

## 2018-05-26 NOTE — Telephone Encounter (Signed)
Left message for [name] to return my call to determine what Hospice will pay for.

## 2018-05-27 ENCOUNTER — Telehealth: Payer: Self-pay | Admitting: Family Medicine

## 2018-05-27 NOTE — Telephone Encounter (Signed)
See note

## 2018-05-27 NOTE — Telephone Encounter (Signed)
Copied from [place] [id number]. Topic: Quick Communication - See Telephone Encounter >> May 27, 2018  1:24 PM Ivar Drape wrote: CRM for notification. See Telephone encounter for: 05/27/18. Rickey Barbara RN at [place] [phone] would like a return call from [place].

## 2018-05-28 DIAGNOSIS — F0280 Dementia in other diseases classified elsewhere without behavioral disturbance: Secondary | ICD-10-CM | POA: Diagnosis not present

## 2018-05-28 DIAGNOSIS — E119 Type 2 diabetes mellitus without complications: Secondary | ICD-10-CM | POA: Diagnosis not present

## 2018-05-28 DIAGNOSIS — I10 Essential (primary) hypertension: Secondary | ICD-10-CM | POA: Diagnosis not present

## 2018-05-28 DIAGNOSIS — I4891 Unspecified atrial fibrillation: Secondary | ICD-10-CM | POA: Diagnosis not present

## 2018-05-28 DIAGNOSIS — E46 Unspecified protein-calorie malnutrition: Secondary | ICD-10-CM | POA: Diagnosis not present

## 2018-05-28 DIAGNOSIS — R630 Anorexia: Secondary | ICD-10-CM | POA: Diagnosis not present

## 2018-05-28 NOTE — Telephone Encounter (Signed)
I agree with the concern for fall and because the patient is on hospice. Will need clarification re: goals of care soon. Make sure that she has a follow up visit.

## 2018-05-28 NOTE — Telephone Encounter (Signed)
Spoke with Renee for Hospice. She stated that she spoke with the [facility] and she does not want the patient to continue on the Eliquis or have the US done. They are wanting the patient to go back on the ASA 325 mg. They are concerned because of the patient being a falls risk.

## 2018-05-28 NOTE — Telephone Encounter (Signed)
Please see previous phone note.  

## 2018-05-28 NOTE — Telephone Encounter (Signed)
Left message for patient to return call.

## 2018-06-04 DIAGNOSIS — I10 Essential (primary) hypertension: Secondary | ICD-10-CM | POA: Diagnosis not present

## 2018-06-04 DIAGNOSIS — R630 Anorexia: Secondary | ICD-10-CM | POA: Diagnosis not present

## 2018-06-04 DIAGNOSIS — E119 Type 2 diabetes mellitus without complications: Secondary | ICD-10-CM | POA: Diagnosis not present

## 2018-06-04 DIAGNOSIS — E46 Unspecified protein-calorie malnutrition: Secondary | ICD-10-CM | POA: Diagnosis not present

## 2018-06-04 DIAGNOSIS — F0280 Dementia in other diseases classified elsewhere without behavioral disturbance: Secondary | ICD-10-CM | POA: Diagnosis not present

## 2018-06-04 DIAGNOSIS — I4891 Unspecified atrial fibrillation: Secondary | ICD-10-CM | POA: Diagnosis not present

## 2018-06-07 DIAGNOSIS — E46 Unspecified protein-calorie malnutrition: Secondary | ICD-10-CM | POA: Diagnosis not present

## 2018-06-07 DIAGNOSIS — R609 Edema, unspecified: Secondary | ICD-10-CM | POA: Diagnosis not present

## 2018-06-07 DIAGNOSIS — I82503 Chronic embolism and thrombosis of unspecified deep veins of lower extremity, bilateral: Secondary | ICD-10-CM | POA: Diagnosis not present

## 2018-06-07 DIAGNOSIS — I4891 Unspecified atrial fibrillation: Secondary | ICD-10-CM | POA: Diagnosis not present

## 2018-06-07 DIAGNOSIS — K219 Gastro-esophageal reflux disease without esophagitis: Secondary | ICD-10-CM | POA: Diagnosis not present

## 2018-06-07 DIAGNOSIS — E785 Hyperlipidemia, unspecified: Secondary | ICD-10-CM | POA: Diagnosis not present

## 2018-06-07 DIAGNOSIS — I10 Essential (primary) hypertension: Secondary | ICD-10-CM | POA: Diagnosis not present

## 2018-06-07 DIAGNOSIS — H04123 Dry eye syndrome of bilateral lacrimal glands: Secondary | ICD-10-CM | POA: Diagnosis not present

## 2018-06-07 DIAGNOSIS — D649 Anemia, unspecified: Secondary | ICD-10-CM | POA: Diagnosis not present

## 2018-06-07 DIAGNOSIS — E039 Hypothyroidism, unspecified: Secondary | ICD-10-CM | POA: Diagnosis not present

## 2018-06-07 DIAGNOSIS — F339 Major depressive disorder, recurrent, unspecified: Secondary | ICD-10-CM | POA: Diagnosis not present

## 2018-06-07 DIAGNOSIS — E119 Type 2 diabetes mellitus without complications: Secondary | ICD-10-CM | POA: Diagnosis not present

## 2018-06-07 DIAGNOSIS — F0280 Dementia in other diseases classified elsewhere without behavioral disturbance: Secondary | ICD-10-CM | POA: Diagnosis not present

## 2018-06-07 DIAGNOSIS — G2581 Restless legs syndrome: Secondary | ICD-10-CM | POA: Diagnosis not present

## 2018-06-07 DIAGNOSIS — G619 Inflammatory polyneuropathy, unspecified: Secondary | ICD-10-CM | POA: Diagnosis not present

## 2018-06-07 DIAGNOSIS — N3289 Other specified disorders of bladder: Secondary | ICD-10-CM | POA: Diagnosis not present

## 2018-06-07 DIAGNOSIS — R630 Anorexia: Secondary | ICD-10-CM | POA: Diagnosis not present

## 2018-06-11 DIAGNOSIS — E119 Type 2 diabetes mellitus without complications: Secondary | ICD-10-CM | POA: Diagnosis not present

## 2018-06-11 DIAGNOSIS — F0280 Dementia in other diseases classified elsewhere without behavioral disturbance: Secondary | ICD-10-CM | POA: Diagnosis not present

## 2018-06-11 DIAGNOSIS — I4891 Unspecified atrial fibrillation: Secondary | ICD-10-CM | POA: Diagnosis not present

## 2018-06-11 DIAGNOSIS — R630 Anorexia: Secondary | ICD-10-CM | POA: Diagnosis not present

## 2018-06-11 DIAGNOSIS — E46 Unspecified protein-calorie malnutrition: Secondary | ICD-10-CM | POA: Diagnosis not present

## 2018-06-11 DIAGNOSIS — I10 Essential (primary) hypertension: Secondary | ICD-10-CM | POA: Diagnosis not present

## 2018-06-18 DIAGNOSIS — R630 Anorexia: Secondary | ICD-10-CM | POA: Diagnosis not present

## 2018-06-18 DIAGNOSIS — E119 Type 2 diabetes mellitus without complications: Secondary | ICD-10-CM | POA: Diagnosis not present

## 2018-06-18 DIAGNOSIS — I10 Essential (primary) hypertension: Secondary | ICD-10-CM | POA: Diagnosis not present

## 2018-06-18 DIAGNOSIS — I4891 Unspecified atrial fibrillation: Secondary | ICD-10-CM | POA: Diagnosis not present

## 2018-06-18 DIAGNOSIS — F0280 Dementia in other diseases classified elsewhere without behavioral disturbance: Secondary | ICD-10-CM | POA: Diagnosis not present

## 2018-06-18 DIAGNOSIS — E46 Unspecified protein-calorie malnutrition: Secondary | ICD-10-CM | POA: Diagnosis not present

## 2018-06-21 DIAGNOSIS — I4891 Unspecified atrial fibrillation: Secondary | ICD-10-CM | POA: Diagnosis not present

## 2018-06-21 DIAGNOSIS — I10 Essential (primary) hypertension: Secondary | ICD-10-CM | POA: Diagnosis not present

## 2018-06-21 DIAGNOSIS — R630 Anorexia: Secondary | ICD-10-CM | POA: Diagnosis not present

## 2018-06-21 DIAGNOSIS — E46 Unspecified protein-calorie malnutrition: Secondary | ICD-10-CM | POA: Diagnosis not present

## 2018-06-21 DIAGNOSIS — E119 Type 2 diabetes mellitus without complications: Secondary | ICD-10-CM | POA: Diagnosis not present

## 2018-06-21 DIAGNOSIS — F0280 Dementia in other diseases classified elsewhere without behavioral disturbance: Secondary | ICD-10-CM | POA: Diagnosis not present

## 2018-06-23 NOTE — Telephone Encounter (Signed)
Tried to call patient to schedule. No answer or voice mail set up.

## 2018-06-24 DIAGNOSIS — I4891 Unspecified atrial fibrillation: Secondary | ICD-10-CM | POA: Diagnosis not present

## 2018-06-24 DIAGNOSIS — I10 Essential (primary) hypertension: Secondary | ICD-10-CM | POA: Diagnosis not present

## 2018-06-24 DIAGNOSIS — R630 Anorexia: Secondary | ICD-10-CM | POA: Diagnosis not present

## 2018-06-24 DIAGNOSIS — E119 Type 2 diabetes mellitus without complications: Secondary | ICD-10-CM | POA: Diagnosis not present

## 2018-06-24 DIAGNOSIS — E46 Unspecified protein-calorie malnutrition: Secondary | ICD-10-CM | POA: Diagnosis not present

## 2018-06-24 DIAGNOSIS — F0280 Dementia in other diseases classified elsewhere without behavioral disturbance: Secondary | ICD-10-CM | POA: Diagnosis not present

## 2018-07-02 ENCOUNTER — Other Ambulatory Visit: Payer: Self-pay | Admitting: Family Medicine

## 2018-07-02 DIAGNOSIS — E119 Type 2 diabetes mellitus without complications: Secondary | ICD-10-CM | POA: Diagnosis not present

## 2018-07-02 DIAGNOSIS — M179 Osteoarthritis of knee, unspecified: Secondary | ICD-10-CM

## 2018-07-02 DIAGNOSIS — R630 Anorexia: Secondary | ICD-10-CM | POA: Diagnosis not present

## 2018-07-02 DIAGNOSIS — M1710 Unilateral primary osteoarthritis, unspecified knee: Secondary | ICD-10-CM

## 2018-07-02 DIAGNOSIS — E46 Unspecified protein-calorie malnutrition: Secondary | ICD-10-CM | POA: Diagnosis not present

## 2018-07-02 DIAGNOSIS — F0280 Dementia in other diseases classified elsewhere without behavioral disturbance: Secondary | ICD-10-CM | POA: Diagnosis not present

## 2018-07-02 DIAGNOSIS — I4891 Unspecified atrial fibrillation: Secondary | ICD-10-CM | POA: Diagnosis not present

## 2018-07-02 DIAGNOSIS — I10 Essential (primary) hypertension: Secondary | ICD-10-CM | POA: Diagnosis not present

## 2018-07-02 NOTE — Telephone Encounter (Signed)
Copied from [place] [id number]. Topic: Quick Communication - Rx Refill/Question >> Jul 02, 2018  3:00 PM [place], [name] wrote: Medication: lidocaine (LIDODERM) 5 % Has the patient contacted their pharmacy? Yes  Preferred Pharmacy (with phone number or street name): [place], [place] [phone] (Phone) [phone] (Fax)      Agent: Please be advised that RX refills may take up to 3 business days. We ask that you follow-up with your pharmacy.

## 2018-07-05 MED ORDER — LIDOCAINE 5 % EX PTCH: MEDICATED_PATCH | CUTANEOUS | 1 refills | Status: DC

## 2018-07-05 NOTE — Telephone Encounter (Signed)
Lidoderm patches  LRF 04/01/18  #30  1 refill  LOV 05/17/18 Dr. Niel Hummer [facility] [place], [place] - [address]            [phone] (Phone) [phone] (Fax)

## 2018-07-05 NOTE — Telephone Encounter (Signed)
Ok to refill 

## 2018-07-08 DIAGNOSIS — I10 Essential (primary) hypertension: Secondary | ICD-10-CM | POA: Diagnosis not present

## 2018-07-08 DIAGNOSIS — I4891 Unspecified atrial fibrillation: Secondary | ICD-10-CM | POA: Diagnosis not present

## 2018-07-08 DIAGNOSIS — F339 Major depressive disorder, recurrent, unspecified: Secondary | ICD-10-CM | POA: Diagnosis not present

## 2018-07-08 DIAGNOSIS — K219 Gastro-esophageal reflux disease without esophagitis: Secondary | ICD-10-CM | POA: Diagnosis not present

## 2018-07-08 DIAGNOSIS — H04123 Dry eye syndrome of bilateral lacrimal glands: Secondary | ICD-10-CM | POA: Diagnosis not present

## 2018-07-08 DIAGNOSIS — N3289 Other specified disorders of bladder: Secondary | ICD-10-CM | POA: Diagnosis not present

## 2018-07-08 DIAGNOSIS — E039 Hypothyroidism, unspecified: Secondary | ICD-10-CM | POA: Diagnosis not present

## 2018-07-08 DIAGNOSIS — E119 Type 2 diabetes mellitus without complications: Secondary | ICD-10-CM | POA: Diagnosis not present

## 2018-07-08 DIAGNOSIS — R609 Edema, unspecified: Secondary | ICD-10-CM | POA: Diagnosis not present

## 2018-07-08 DIAGNOSIS — E46 Unspecified protein-calorie malnutrition: Secondary | ICD-10-CM | POA: Diagnosis not present

## 2018-07-08 DIAGNOSIS — E785 Hyperlipidemia, unspecified: Secondary | ICD-10-CM | POA: Diagnosis not present

## 2018-07-08 DIAGNOSIS — G619 Inflammatory polyneuropathy, unspecified: Secondary | ICD-10-CM | POA: Diagnosis not present

## 2018-07-08 DIAGNOSIS — G2581 Restless legs syndrome: Secondary | ICD-10-CM | POA: Diagnosis not present

## 2018-07-08 DIAGNOSIS — R630 Anorexia: Secondary | ICD-10-CM | POA: Diagnosis not present

## 2018-07-08 DIAGNOSIS — I82503 Chronic embolism and thrombosis of unspecified deep veins of lower extremity, bilateral: Secondary | ICD-10-CM | POA: Diagnosis not present

## 2018-07-08 DIAGNOSIS — F0280 Dementia in other diseases classified elsewhere without behavioral disturbance: Secondary | ICD-10-CM | POA: Diagnosis not present

## 2018-07-08 DIAGNOSIS — D649 Anemia, unspecified: Secondary | ICD-10-CM | POA: Diagnosis not present

## 2018-07-15 DIAGNOSIS — E119 Type 2 diabetes mellitus without complications: Secondary | ICD-10-CM | POA: Diagnosis not present

## 2018-07-15 DIAGNOSIS — R630 Anorexia: Secondary | ICD-10-CM | POA: Diagnosis not present

## 2018-07-15 DIAGNOSIS — I4891 Unspecified atrial fibrillation: Secondary | ICD-10-CM | POA: Diagnosis not present

## 2018-07-15 DIAGNOSIS — I10 Essential (primary) hypertension: Secondary | ICD-10-CM | POA: Diagnosis not present

## 2018-07-15 DIAGNOSIS — F0280 Dementia in other diseases classified elsewhere without behavioral disturbance: Secondary | ICD-10-CM | POA: Diagnosis not present

## 2018-07-15 DIAGNOSIS — E46 Unspecified protein-calorie malnutrition: Secondary | ICD-10-CM | POA: Diagnosis not present

## 2018-07-16 DIAGNOSIS — E119 Type 2 diabetes mellitus without complications: Secondary | ICD-10-CM | POA: Diagnosis not present

## 2018-07-16 DIAGNOSIS — F0280 Dementia in other diseases classified elsewhere without behavioral disturbance: Secondary | ICD-10-CM | POA: Diagnosis not present

## 2018-07-16 DIAGNOSIS — E46 Unspecified protein-calorie malnutrition: Secondary | ICD-10-CM | POA: Diagnosis not present

## 2018-07-16 DIAGNOSIS — I10 Essential (primary) hypertension: Secondary | ICD-10-CM | POA: Diagnosis not present

## 2018-07-16 DIAGNOSIS — I4891 Unspecified atrial fibrillation: Secondary | ICD-10-CM | POA: Diagnosis not present

## 2018-07-16 DIAGNOSIS — R630 Anorexia: Secondary | ICD-10-CM | POA: Diagnosis not present

## 2018-07-20 ENCOUNTER — Other Ambulatory Visit: Payer: Self-pay | Admitting: Family Medicine

## 2018-07-22 DIAGNOSIS — I10 Essential (primary) hypertension: Secondary | ICD-10-CM | POA: Diagnosis not present

## 2018-07-22 DIAGNOSIS — E46 Unspecified protein-calorie malnutrition: Secondary | ICD-10-CM | POA: Diagnosis not present

## 2018-07-22 DIAGNOSIS — E119 Type 2 diabetes mellitus without complications: Secondary | ICD-10-CM | POA: Diagnosis not present

## 2018-07-22 DIAGNOSIS — F0280 Dementia in other diseases classified elsewhere without behavioral disturbance: Secondary | ICD-10-CM | POA: Diagnosis not present

## 2018-07-22 DIAGNOSIS — R630 Anorexia: Secondary | ICD-10-CM | POA: Diagnosis not present

## 2018-07-22 DIAGNOSIS — I4891 Unspecified atrial fibrillation: Secondary | ICD-10-CM | POA: Diagnosis not present

## 2018-07-30 DIAGNOSIS — E46 Unspecified protein-calorie malnutrition: Secondary | ICD-10-CM | POA: Diagnosis not present

## 2018-07-30 DIAGNOSIS — I4891 Unspecified atrial fibrillation: Secondary | ICD-10-CM | POA: Diagnosis not present

## 2018-07-30 DIAGNOSIS — R630 Anorexia: Secondary | ICD-10-CM | POA: Diagnosis not present

## 2018-07-30 DIAGNOSIS — F0280 Dementia in other diseases classified elsewhere without behavioral disturbance: Secondary | ICD-10-CM | POA: Diagnosis not present

## 2018-07-30 DIAGNOSIS — E119 Type 2 diabetes mellitus without complications: Secondary | ICD-10-CM | POA: Diagnosis not present

## 2018-07-30 DIAGNOSIS — I10 Essential (primary) hypertension: Secondary | ICD-10-CM | POA: Diagnosis not present

## 2018-08-06 DIAGNOSIS — E119 Type 2 diabetes mellitus without complications: Secondary | ICD-10-CM | POA: Diagnosis not present

## 2018-08-06 DIAGNOSIS — F0280 Dementia in other diseases classified elsewhere without behavioral disturbance: Secondary | ICD-10-CM | POA: Diagnosis not present

## 2018-08-06 DIAGNOSIS — I4891 Unspecified atrial fibrillation: Secondary | ICD-10-CM | POA: Diagnosis not present

## 2018-08-06 DIAGNOSIS — I10 Essential (primary) hypertension: Secondary | ICD-10-CM | POA: Diagnosis not present

## 2018-08-06 DIAGNOSIS — E46 Unspecified protein-calorie malnutrition: Secondary | ICD-10-CM | POA: Diagnosis not present

## 2018-08-06 DIAGNOSIS — R630 Anorexia: Secondary | ICD-10-CM | POA: Diagnosis not present

## 2018-08-08 DIAGNOSIS — E039 Hypothyroidism, unspecified: Secondary | ICD-10-CM | POA: Diagnosis not present

## 2018-08-08 DIAGNOSIS — E119 Type 2 diabetes mellitus without complications: Secondary | ICD-10-CM | POA: Diagnosis not present

## 2018-08-08 DIAGNOSIS — D649 Anemia, unspecified: Secondary | ICD-10-CM | POA: Diagnosis not present

## 2018-08-08 DIAGNOSIS — R630 Anorexia: Secondary | ICD-10-CM | POA: Diagnosis not present

## 2018-08-08 DIAGNOSIS — F339 Major depressive disorder, recurrent, unspecified: Secondary | ICD-10-CM | POA: Diagnosis not present

## 2018-08-08 DIAGNOSIS — E46 Unspecified protein-calorie malnutrition: Secondary | ICD-10-CM | POA: Diagnosis not present

## 2018-08-08 DIAGNOSIS — R609 Edema, unspecified: Secondary | ICD-10-CM | POA: Diagnosis not present

## 2018-08-08 DIAGNOSIS — H04123 Dry eye syndrome of bilateral lacrimal glands: Secondary | ICD-10-CM | POA: Diagnosis not present

## 2018-08-08 DIAGNOSIS — N3289 Other specified disorders of bladder: Secondary | ICD-10-CM | POA: Diagnosis not present

## 2018-08-08 DIAGNOSIS — G2581 Restless legs syndrome: Secondary | ICD-10-CM | POA: Diagnosis not present

## 2018-08-08 DIAGNOSIS — G619 Inflammatory polyneuropathy, unspecified: Secondary | ICD-10-CM | POA: Diagnosis not present

## 2018-08-08 DIAGNOSIS — I10 Essential (primary) hypertension: Secondary | ICD-10-CM | POA: Diagnosis not present

## 2018-08-08 DIAGNOSIS — E785 Hyperlipidemia, unspecified: Secondary | ICD-10-CM | POA: Diagnosis not present

## 2018-08-08 DIAGNOSIS — I82503 Chronic embolism and thrombosis of unspecified deep veins of lower extremity, bilateral: Secondary | ICD-10-CM | POA: Diagnosis not present

## 2018-08-08 DIAGNOSIS — F0280 Dementia in other diseases classified elsewhere without behavioral disturbance: Secondary | ICD-10-CM | POA: Diagnosis not present

## 2018-08-08 DIAGNOSIS — I4891 Unspecified atrial fibrillation: Secondary | ICD-10-CM | POA: Diagnosis not present

## 2018-08-08 DIAGNOSIS — K219 Gastro-esophageal reflux disease without esophagitis: Secondary | ICD-10-CM | POA: Diagnosis not present

## 2018-08-11 DIAGNOSIS — I10 Essential (primary) hypertension: Secondary | ICD-10-CM | POA: Diagnosis not present

## 2018-08-11 DIAGNOSIS — F0280 Dementia in other diseases classified elsewhere without behavioral disturbance: Secondary | ICD-10-CM | POA: Diagnosis not present

## 2018-08-11 DIAGNOSIS — I4891 Unspecified atrial fibrillation: Secondary | ICD-10-CM | POA: Diagnosis not present

## 2018-08-11 DIAGNOSIS — E46 Unspecified protein-calorie malnutrition: Secondary | ICD-10-CM | POA: Diagnosis not present

## 2018-08-11 DIAGNOSIS — R630 Anorexia: Secondary | ICD-10-CM | POA: Diagnosis not present

## 2018-08-11 DIAGNOSIS — E119 Type 2 diabetes mellitus without complications: Secondary | ICD-10-CM | POA: Diagnosis not present

## 2018-08-12 ENCOUNTER — Other Ambulatory Visit: Payer: Self-pay | Admitting: Family Medicine

## 2018-08-12 DIAGNOSIS — I10 Essential (primary) hypertension: Secondary | ICD-10-CM | POA: Diagnosis not present

## 2018-08-12 DIAGNOSIS — E119 Type 2 diabetes mellitus without complications: Secondary | ICD-10-CM | POA: Diagnosis not present

## 2018-08-12 DIAGNOSIS — K219 Gastro-esophageal reflux disease without esophagitis: Secondary | ICD-10-CM

## 2018-08-12 DIAGNOSIS — E46 Unspecified protein-calorie malnutrition: Secondary | ICD-10-CM | POA: Diagnosis not present

## 2018-08-12 DIAGNOSIS — R630 Anorexia: Secondary | ICD-10-CM | POA: Diagnosis not present

## 2018-08-12 DIAGNOSIS — I4891 Unspecified atrial fibrillation: Secondary | ICD-10-CM | POA: Diagnosis not present

## 2018-08-12 DIAGNOSIS — F0280 Dementia in other diseases classified elsewhere without behavioral disturbance: Secondary | ICD-10-CM | POA: Diagnosis not present

## 2018-08-12 MED ORDER — FUROSEMIDE 20 MG PO TABS: 20.0000 mg | ORAL_TABLET | ORAL | 2 refills | Status: DC | PRN

## 2018-08-12 MED ORDER — PANTOPRAZOLE SODIUM 40 MG PO TBEC: 40.0000 mg | DELAYED_RELEASE_TABLET | Freq: Every day | ORAL | 5 refills | Status: DC

## 2018-08-12 NOTE — Telephone Encounter (Signed)
Requip refill Last refill:07/21/18 #30/0 refill Last OV:05/17/18 XJO:[id number] Pharmacy: [facility] [place], [place] [phone] (Phone) [phone] (Fax)

## 2018-08-12 NOTE — Telephone Encounter (Signed)
See note

## 2018-08-12 NOTE — Telephone Encounter (Signed)
Copied from [place] [id number]. Topic: Quick Communication - Rx Refill/Question >> Aug 12, 2018  3:41 PM Keene Breath wrote: Medication: furosemide (LASIX) 20 MG tablet / ropinirole (REQUIP) 5 MG tablet / pantoprazole (PROTONIX) 40 MG tablet  Patient's nurse called to request a refill for the above medications.  Please call if you have any questions.  CB# [phone]  Preferred Pharmacy (with phone number or street name): [place], [place] [phone] (Phone) [phone] (Fax)

## 2018-08-13 NOTE — Telephone Encounter (Signed)
Please advise on Requip refill.

## 2018-08-15 NOTE — Telephone Encounter (Signed)
One month Rx and appointment.

## 2018-08-16 MED ORDER — ROPINIROLE HCL 5 MG PO TABS: 5.0000 mg | ORAL_TABLET | Freq: Every day | ORAL | 0 refills | Status: DC

## 2018-08-19 DIAGNOSIS — I4891 Unspecified atrial fibrillation: Secondary | ICD-10-CM | POA: Diagnosis not present

## 2018-08-19 DIAGNOSIS — E46 Unspecified protein-calorie malnutrition: Secondary | ICD-10-CM | POA: Diagnosis not present

## 2018-08-19 DIAGNOSIS — E119 Type 2 diabetes mellitus without complications: Secondary | ICD-10-CM | POA: Diagnosis not present

## 2018-08-19 DIAGNOSIS — R630 Anorexia: Secondary | ICD-10-CM | POA: Diagnosis not present

## 2018-08-19 DIAGNOSIS — I10 Essential (primary) hypertension: Secondary | ICD-10-CM | POA: Diagnosis not present

## 2018-08-19 DIAGNOSIS — F0280 Dementia in other diseases classified elsewhere without behavioral disturbance: Secondary | ICD-10-CM | POA: Diagnosis not present

## 2018-08-26 DIAGNOSIS — E119 Type 2 diabetes mellitus without complications: Secondary | ICD-10-CM | POA: Diagnosis not present

## 2018-08-26 DIAGNOSIS — I4891 Unspecified atrial fibrillation: Secondary | ICD-10-CM | POA: Diagnosis not present

## 2018-08-26 DIAGNOSIS — F0280 Dementia in other diseases classified elsewhere without behavioral disturbance: Secondary | ICD-10-CM | POA: Diagnosis not present

## 2018-08-26 DIAGNOSIS — E46 Unspecified protein-calorie malnutrition: Secondary | ICD-10-CM | POA: Diagnosis not present

## 2018-08-26 DIAGNOSIS — I10 Essential (primary) hypertension: Secondary | ICD-10-CM | POA: Diagnosis not present

## 2018-08-26 DIAGNOSIS — R630 Anorexia: Secondary | ICD-10-CM | POA: Diagnosis not present

## 2018-09-02 DIAGNOSIS — I10 Essential (primary) hypertension: Secondary | ICD-10-CM | POA: Diagnosis not present

## 2018-09-02 DIAGNOSIS — R630 Anorexia: Secondary | ICD-10-CM | POA: Diagnosis not present

## 2018-09-02 DIAGNOSIS — F0280 Dementia in other diseases classified elsewhere without behavioral disturbance: Secondary | ICD-10-CM | POA: Diagnosis not present

## 2018-09-02 DIAGNOSIS — E46 Unspecified protein-calorie malnutrition: Secondary | ICD-10-CM | POA: Diagnosis not present

## 2018-09-02 DIAGNOSIS — I4891 Unspecified atrial fibrillation: Secondary | ICD-10-CM | POA: Diagnosis not present

## 2018-09-02 DIAGNOSIS — E119 Type 2 diabetes mellitus without complications: Secondary | ICD-10-CM | POA: Diagnosis not present

## 2018-09-07 ENCOUNTER — Ambulatory Visit (INDEPENDENT_AMBULATORY_CARE_PROVIDER_SITE_OTHER): Admitting: Family Medicine

## 2018-09-07 ENCOUNTER — Encounter: Payer: Self-pay | Admitting: Family Medicine

## 2018-09-07 VITALS — BP 98/58 | HR 84 | Temp 97.5°F

## 2018-09-07 DIAGNOSIS — R630 Anorexia: Secondary | ICD-10-CM | POA: Diagnosis not present

## 2018-09-07 DIAGNOSIS — N3289 Other specified disorders of bladder: Secondary | ICD-10-CM | POA: Diagnosis not present

## 2018-09-07 DIAGNOSIS — I10 Essential (primary) hypertension: Secondary | ICD-10-CM | POA: Diagnosis not present

## 2018-09-07 DIAGNOSIS — E785 Hyperlipidemia, unspecified: Secondary | ICD-10-CM | POA: Diagnosis not present

## 2018-09-07 DIAGNOSIS — G2581 Restless legs syndrome: Secondary | ICD-10-CM | POA: Diagnosis not present

## 2018-09-07 DIAGNOSIS — R609 Edema, unspecified: Secondary | ICD-10-CM | POA: Diagnosis not present

## 2018-09-07 DIAGNOSIS — H04123 Dry eye syndrome of bilateral lacrimal glands: Secondary | ICD-10-CM | POA: Diagnosis not present

## 2018-09-07 DIAGNOSIS — E119 Type 2 diabetes mellitus without complications: Secondary | ICD-10-CM | POA: Diagnosis not present

## 2018-09-07 DIAGNOSIS — R5383 Other fatigue: Secondary | ICD-10-CM | POA: Diagnosis not present

## 2018-09-07 DIAGNOSIS — F339 Major depressive disorder, recurrent, unspecified: Secondary | ICD-10-CM | POA: Diagnosis not present

## 2018-09-07 DIAGNOSIS — I82503 Chronic embolism and thrombosis of unspecified deep veins of lower extremity, bilateral: Secondary | ICD-10-CM | POA: Diagnosis not present

## 2018-09-07 DIAGNOSIS — G619 Inflammatory polyneuropathy, unspecified: Secondary | ICD-10-CM | POA: Diagnosis not present

## 2018-09-07 DIAGNOSIS — K219 Gastro-esophageal reflux disease without esophagitis: Secondary | ICD-10-CM | POA: Diagnosis not present

## 2018-09-07 DIAGNOSIS — I4891 Unspecified atrial fibrillation: Secondary | ICD-10-CM | POA: Diagnosis not present

## 2018-09-07 DIAGNOSIS — E46 Unspecified protein-calorie malnutrition: Secondary | ICD-10-CM | POA: Diagnosis not present

## 2018-09-07 DIAGNOSIS — R600 Localized edema: Secondary | ICD-10-CM

## 2018-09-07 DIAGNOSIS — D649 Anemia, unspecified: Secondary | ICD-10-CM | POA: Diagnosis not present

## 2018-09-07 DIAGNOSIS — F0280 Dementia in other diseases classified elsewhere without behavioral disturbance: Secondary | ICD-10-CM | POA: Diagnosis not present

## 2018-09-07 DIAGNOSIS — E039 Hypothyroidism, unspecified: Secondary | ICD-10-CM | POA: Diagnosis not present

## 2018-09-07 LAB — COMPREHENSIVE METABOLIC PANEL
ALT: 9 U/L (ref 0–35)
AST: 20 U/L (ref 0–37)
Albumin: 3.8 g/dL (ref 3.5–5.2)
Alkaline Phosphatase: 129 U/L — ABNORMAL HIGH (ref 39–117)
BUN: 41 mg/dL — ABNORMAL HIGH (ref 6–23)
CO2: 25 mEq/L (ref 19–32)
Calcium: 9 mg/dL (ref 8.4–10.5)
Chloride: 104 mEq/L (ref 96–112)
Creatinine, Ser: 1.61 mg/dL — ABNORMAL HIGH (ref 0.40–1.20)
GFR: 31.46 mL/min — ABNORMAL LOW (ref >60.00)
Glucose, Bld: 134 mg/dL — ABNORMAL HIGH (ref 70–99)
Potassium: 4.2 mEq/L (ref 3.5–5.1)
Sodium: 141 mEq/L (ref 135–145)
Total Bilirubin: 0.4 mg/dL (ref 0.2–1.2)
Total Protein: 6.4 g/dL (ref 6.0–8.3)

## 2018-09-07 NOTE — Progress Notes (Signed)
[name] is a 84 y.o. female is here for follow up.  History of Present Illness:   Shaune Pascal CMA acting as scribe for Dr. Juleen China.  HPI: Patient comes in today for a follow up. Patient on in the care of Hospice. Patient stated that she fell out of her chair about two weeks ago. She was asleep when she fell out of it. EMS was called to help her up. She did not go to the ED. Feeling better now.  Hospice comes out weekly to help with medications.  Neighbors stop by daily. She still has her little companion dog. Feels happy. Like to be alone.  Feels lightheaded with sitting up sometimes. No CP, SOB, or extra edema.  Health Maintenance Due  Topic Date Due  . OPHTHALMOLOGY EXAM  07/24/1931  . TETANUS/TDAP  07/23/1940  . DEXA SCAN  07/23/1986  . PNA vac Low Risk Adult (1 of 2 - PCV13) 07/23/1986  . HEMOGLOBIN A1C  03/22/2018  . URINE MICROALBUMIN  04/11/2018  . INFLUENZA VACCINE  07/08/2018   Depression screen PHQ 2/9 11/03/2017  Decreased Interest 0  Down, Depressed, Hopeless 0  PHQ - 2 Score 0   PMHx, SurgHx, SocialHx, FamHx, Medications, and Allergies were reviewed in the Visit Navigator and updated as appropriate.   Patient Active Problem List   Diagnosis Date Noted  . Auditory hallucinations 12/25/2017  . Altered mental status   . DNR (do not resuscitate) 12/12/2017  . CKD (chronic kidney disease), stage III ([place]) 11/22/2017  . Hypertension 11/22/2017  . DVT (deep vein thrombosis) in pregnancy 11/22/2017  . Atrial fibrillation, chronic 11/22/2017  . Depression 11/22/2017  . Protein-calorie malnutrition, moderate ([place]) 11/22/2017  . Dry skin dermatitis 09/24/2017  . Osteoarthritis 07/31/2017  . CKD stage 4 due to type 2 diabetes mellitus ([place]) 04/19/2017  . Iron deficiency anemia 04/19/2017  . B12 deficiency 04/08/2017  . Chronic constipation 04/08/2017  . Chronic deep vein thrombosis (DVT) of left lower extremity ([place]) 04/08/2017  . Episode of recurrent  major depressive disorder ([place]) 04/08/2017  . Monoclonal gammopathy of unknown significance (MGUS) 04/08/2017  . Peripheral neuropathy 04/08/2017  . Rectal mass   . Abdominal pain 06/12/2015  . UTI (urinary tract infection) 03/02/2015  . Pancreas divisum 03/02/2015  . Physical deconditioning 03/01/2015  . Nausea vomiting and diarrhea   . Pancreatic cyst   . Hiatal hernia- moderate to large 10/06/2014  . Heme + stool 10/04/2014  . Crohn's ileocolitis ([place]) 10/04/2014  . 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 Arthritis   . Hx pulmonary embolism   . Hypothyroidism 04/11/2014  . Type 2 diabetes mellitus with stage 3 chronic kidney disease ([place]) 04/11/2014  . Anemia of chronic disease 04/03/2014  . OAB (overactive bladder) 01/07/2012  . Restless leg syndrome 03/09/2008  . Benign essential hypertension 03/09/2008  . ESOPHAGEAL STRICTURE 03/09/2008  . GERD (gastroesophageal reflux disease) 03/09/2008  . Diverticulosis of large intestine 03/09/2008  . Polymyalgia rheumatica ([place]) 03/09/2008   Social History   Tobacco Use  . Smoking status: Never Smoker  . Smokeless tobacco: Never Used  Substance Use Topics  . Alcohol use: No  . Drug use: No   Current Medications and Allergies:   .  furosemide (LASIX) 20 MG tablet, Take 1 tablet (20 mg total) by mouth every other day as needed for fluid or edema., Disp: 30 tablet, Rfl: 2 .  lidocaine (LIDODERM) 5 %, PLACE 1 PATCH ON AFFECTED AREA EVERY 12 HOURS FOR PAIN RELIEF,  Disp: 30 patch, Rfl: 1 .  pantoprazole (PROTONIX) 40 MG tablet, Take 1 tablet (40 mg total) by mouth daily., Disp: 30 tablet, Rfl: 5 .  polyvinyl alcohol (LIQUIFILM TEARS) 1.4 % ophthalmic solution, Place 1 drop into both eyes as needed for dry eyes. (Patient not taking: Reported on 05/17/2018), Disp: 15 mL, Rfl: 0 .  ropinirole (REQUIP) 5 MG tablet, Take 1 tablet (5 mg total) by mouth at bedtime., Disp: 30 tablet, Rfl: 0   Allergies  Allergen Reactions  . Sulfa Antibiotics Nausea  And Vomiting  . Penicillins Hives and Rash    ++tolerates Ceftriaxone and keflex++ Has patient had a PCN reaction causing immediate rash, facial/tongue/throat swelling, SOB or lightheadedness with hypotension: Unknown Has patient had a PCN reaction causing severe rash involving mucus membranes or skin necrosis: Unknown Has patient had a PCN reaction that required hospitalization Unknown Has patient had a PCN reaction occurring within the last 10 years: No If all of the above answers are "NO", then may proceed with Cephalosporin use.    Review of Systems   Pertinent items are noted in the HPI. Otherwise, ROS is negative.  Vitals:   Vitals:   09/07/18 1303  BP: (!) 98/58  Pulse: 84  Temp: (!) 97.5 F (36.4 C)  TempSrc: Oral  SpO2: 94%     There is no height or weight on file to calculate BMI.  Physical Exam:   Physical Exam  Constitutional: She appears well-nourished.  HENT:  Head: Normocephalic and atraumatic.  Eyes: Pupils are equal, round, and reactive to light. EOM are normal.  Neck: Normal range of motion. Neck supple.  Cardiovascular: Normal rate, regular rhythm and intact distal pulses.  Pulmonary/Chest: Effort normal.  Abdominal: Soft.  Skin: Skin is warm.  Psychiatric: She has a normal mood and affect. Her behavior is normal.  Nursing note and vitals reviewed.  Assessment and Plan:   Diagnoses and all orders for this visit:  Benign essential hypertension Comments: Likely with orthostasis. CMP only thing able to be sent due to hard stick. Decrease Lasix by 1/2. Monitor weight. Orders: -     Cancel: CBC with Differential/Platelet -     Comprehensive metabolic panel -     Cancel: Magnesium -     Cancel: Brain natriuretic peptide -     Cancel: TSH  Fatigue, unspecified type -     Cancel: CBC with Differential/Platelet -     Comprehensive metabolic panel -     Cancel: Magnesium -     Cancel: Brain natriuretic peptide -     Cancel: TSH  Bilateral lower  extremity edema -     Cancel: CBC with Differential/Platelet -     Comprehensive metabolic panel -     Cancel: Magnesium -     Cancel: Brain natriuretic peptide -     Cancel: TSH   . Reviewed expectations re: course of current medical issues. . Discussed self-management of symptoms. . Outlined signs and symptoms indicating need for more acute intervention. . Patient verbalized understanding and all questions were answered. Marland Kitchen Health Maintenance issues including appropriate healthy diet, exercise, and smoking avoidance were discussed with patient. . See orders for this visit as documented in the electronic medical record. . Patient received an After Visit Summary.  CMA served as Education administrator during this visit. History, Physical, and Plan performed by medical provider. The above documentation has been reviewed and is accurate and complete. Briscoe Deutscher, D.O.  Briscoe Deutscher, DO [place], [place]  09/08/2018    

## 2018-09-08 ENCOUNTER — Encounter: Payer: Self-pay | Admitting: Family Medicine

## 2018-09-09 DIAGNOSIS — I10 Essential (primary) hypertension: Secondary | ICD-10-CM | POA: Diagnosis not present

## 2018-09-09 DIAGNOSIS — I4891 Unspecified atrial fibrillation: Secondary | ICD-10-CM | POA: Diagnosis not present

## 2018-09-09 DIAGNOSIS — F0280 Dementia in other diseases classified elsewhere without behavioral disturbance: Secondary | ICD-10-CM | POA: Diagnosis not present

## 2018-09-09 DIAGNOSIS — E119 Type 2 diabetes mellitus without complications: Secondary | ICD-10-CM | POA: Diagnosis not present

## 2018-09-09 DIAGNOSIS — R630 Anorexia: Secondary | ICD-10-CM | POA: Diagnosis not present

## 2018-09-09 DIAGNOSIS — E46 Unspecified protein-calorie malnutrition: Secondary | ICD-10-CM | POA: Diagnosis not present

## 2018-09-15 DIAGNOSIS — E119 Type 2 diabetes mellitus without complications: Secondary | ICD-10-CM | POA: Diagnosis not present

## 2018-09-15 DIAGNOSIS — R630 Anorexia: Secondary | ICD-10-CM | POA: Diagnosis not present

## 2018-09-15 DIAGNOSIS — E46 Unspecified protein-calorie malnutrition: Secondary | ICD-10-CM | POA: Diagnosis not present

## 2018-09-15 DIAGNOSIS — F0280 Dementia in other diseases classified elsewhere without behavioral disturbance: Secondary | ICD-10-CM | POA: Diagnosis not present

## 2018-09-15 DIAGNOSIS — I4891 Unspecified atrial fibrillation: Secondary | ICD-10-CM | POA: Diagnosis not present

## 2018-09-15 DIAGNOSIS — I10 Essential (primary) hypertension: Secondary | ICD-10-CM | POA: Diagnosis not present

## 2018-09-16 ENCOUNTER — Telehealth: Payer: Self-pay | Admitting: Family Medicine

## 2018-09-16 DIAGNOSIS — E119 Type 2 diabetes mellitus without complications: Secondary | ICD-10-CM | POA: Diagnosis not present

## 2018-09-16 DIAGNOSIS — F0280 Dementia in other diseases classified elsewhere without behavioral disturbance: Secondary | ICD-10-CM | POA: Diagnosis not present

## 2018-09-16 DIAGNOSIS — E46 Unspecified protein-calorie malnutrition: Secondary | ICD-10-CM | POA: Diagnosis not present

## 2018-09-16 DIAGNOSIS — I10 Essential (primary) hypertension: Secondary | ICD-10-CM | POA: Diagnosis not present

## 2018-09-16 DIAGNOSIS — M179 Osteoarthritis of knee, unspecified: Secondary | ICD-10-CM

## 2018-09-16 DIAGNOSIS — M1710 Unilateral primary osteoarthritis, unspecified knee: Secondary | ICD-10-CM

## 2018-09-16 DIAGNOSIS — R630 Anorexia: Secondary | ICD-10-CM | POA: Diagnosis not present

## 2018-09-16 DIAGNOSIS — I4891 Unspecified atrial fibrillation: Secondary | ICD-10-CM | POA: Diagnosis not present

## 2018-09-16 NOTE — Telephone Encounter (Signed)
See note

## 2018-09-16 NOTE — Telephone Encounter (Signed)
Copied from [place] [id number]. Topic: Quick Communication - Rx Refill/Question >> Sep 16, 2018  2:55 PM Waldemar Dickens, [place] R wrote: Medication: lidocaine (LIDODERM) 5 % , ropinirole (REQUIP) 5 MG tablet  Has the patient contacted their pharmacy? Yes  Preferred Pharmacy (with phone number or street name): [place], [place] [phone] (Phone) [phone] (Fax)    Agent: Please be advised that RX refills may take up to 3 business days. We ask that you follow-up with your pharmacy.

## 2018-09-17 MED ORDER — LIDOCAINE 5 % EX PTCH: MEDICATED_PATCH | CUTANEOUS | 1 refills | Status: DC

## 2018-09-17 MED ORDER — ROPINIROLE HCL 5 MG PO TABS: 5.0000 mg | ORAL_TABLET | Freq: Every day | ORAL | 0 refills | Status: DC

## 2018-09-17 NOTE — Telephone Encounter (Signed)
Rx refills sent to pharmacy. 

## 2018-09-23 DIAGNOSIS — E119 Type 2 diabetes mellitus without complications: Secondary | ICD-10-CM | POA: Diagnosis not present

## 2018-09-23 DIAGNOSIS — R630 Anorexia: Secondary | ICD-10-CM | POA: Diagnosis not present

## 2018-09-23 DIAGNOSIS — E46 Unspecified protein-calorie malnutrition: Secondary | ICD-10-CM | POA: Diagnosis not present

## 2018-09-23 DIAGNOSIS — F0280 Dementia in other diseases classified elsewhere without behavioral disturbance: Secondary | ICD-10-CM | POA: Diagnosis not present

## 2018-09-23 DIAGNOSIS — I4891 Unspecified atrial fibrillation: Secondary | ICD-10-CM | POA: Diagnosis not present

## 2018-09-23 DIAGNOSIS — I10 Essential (primary) hypertension: Secondary | ICD-10-CM | POA: Diagnosis not present

## 2018-09-26 DIAGNOSIS — F0280 Dementia in other diseases classified elsewhere without behavioral disturbance: Secondary | ICD-10-CM | POA: Diagnosis not present

## 2018-09-26 DIAGNOSIS — I10 Essential (primary) hypertension: Secondary | ICD-10-CM | POA: Diagnosis not present

## 2018-09-26 DIAGNOSIS — I4891 Unspecified atrial fibrillation: Secondary | ICD-10-CM | POA: Diagnosis not present

## 2018-09-26 DIAGNOSIS — R630 Anorexia: Secondary | ICD-10-CM | POA: Diagnosis not present

## 2018-09-26 DIAGNOSIS — E119 Type 2 diabetes mellitus without complications: Secondary | ICD-10-CM | POA: Diagnosis not present

## 2018-09-26 DIAGNOSIS — E46 Unspecified protein-calorie malnutrition: Secondary | ICD-10-CM | POA: Diagnosis not present

## 2018-09-28 DIAGNOSIS — E46 Unspecified protein-calorie malnutrition: Secondary | ICD-10-CM | POA: Diagnosis not present

## 2018-09-28 DIAGNOSIS — I4891 Unspecified atrial fibrillation: Secondary | ICD-10-CM | POA: Diagnosis not present

## 2018-09-28 DIAGNOSIS — E119 Type 2 diabetes mellitus without complications: Secondary | ICD-10-CM | POA: Diagnosis not present

## 2018-09-28 DIAGNOSIS — R630 Anorexia: Secondary | ICD-10-CM | POA: Diagnosis not present

## 2018-09-28 DIAGNOSIS — I10 Essential (primary) hypertension: Secondary | ICD-10-CM | POA: Diagnosis not present

## 2018-09-28 DIAGNOSIS — F0280 Dementia in other diseases classified elsewhere without behavioral disturbance: Secondary | ICD-10-CM | POA: Diagnosis not present

## 2018-09-30 DIAGNOSIS — E46 Unspecified protein-calorie malnutrition: Secondary | ICD-10-CM | POA: Diagnosis not present

## 2018-09-30 DIAGNOSIS — R630 Anorexia: Secondary | ICD-10-CM | POA: Diagnosis not present

## 2018-09-30 DIAGNOSIS — I4891 Unspecified atrial fibrillation: Secondary | ICD-10-CM | POA: Diagnosis not present

## 2018-09-30 DIAGNOSIS — E119 Type 2 diabetes mellitus without complications: Secondary | ICD-10-CM | POA: Diagnosis not present

## 2018-09-30 DIAGNOSIS — F0280 Dementia in other diseases classified elsewhere without behavioral disturbance: Secondary | ICD-10-CM | POA: Diagnosis not present

## 2018-09-30 DIAGNOSIS — I10 Essential (primary) hypertension: Secondary | ICD-10-CM | POA: Diagnosis not present

## 2018-10-04 DIAGNOSIS — I10 Essential (primary) hypertension: Secondary | ICD-10-CM | POA: Diagnosis not present

## 2018-10-04 DIAGNOSIS — E46 Unspecified protein-calorie malnutrition: Secondary | ICD-10-CM | POA: Diagnosis not present

## 2018-10-04 DIAGNOSIS — R630 Anorexia: Secondary | ICD-10-CM | POA: Diagnosis not present

## 2018-10-04 DIAGNOSIS — I4891 Unspecified atrial fibrillation: Secondary | ICD-10-CM | POA: Diagnosis not present

## 2018-10-04 DIAGNOSIS — E119 Type 2 diabetes mellitus without complications: Secondary | ICD-10-CM | POA: Diagnosis not present

## 2018-10-04 DIAGNOSIS — F0280 Dementia in other diseases classified elsewhere without behavioral disturbance: Secondary | ICD-10-CM | POA: Diagnosis not present

## 2018-10-06 DIAGNOSIS — F0280 Dementia in other diseases classified elsewhere without behavioral disturbance: Secondary | ICD-10-CM | POA: Diagnosis not present

## 2018-10-06 DIAGNOSIS — I4891 Unspecified atrial fibrillation: Secondary | ICD-10-CM | POA: Diagnosis not present

## 2018-10-06 DIAGNOSIS — I10 Essential (primary) hypertension: Secondary | ICD-10-CM | POA: Diagnosis not present

## 2018-10-06 DIAGNOSIS — R630 Anorexia: Secondary | ICD-10-CM | POA: Diagnosis not present

## 2018-10-06 DIAGNOSIS — E46 Unspecified protein-calorie malnutrition: Secondary | ICD-10-CM | POA: Diagnosis not present

## 2018-10-06 DIAGNOSIS — E119 Type 2 diabetes mellitus without complications: Secondary | ICD-10-CM | POA: Diagnosis not present

## 2018-10-07 DIAGNOSIS — I10 Essential (primary) hypertension: Secondary | ICD-10-CM | POA: Diagnosis not present

## 2018-10-07 DIAGNOSIS — R630 Anorexia: Secondary | ICD-10-CM | POA: Diagnosis not present

## 2018-10-07 DIAGNOSIS — E119 Type 2 diabetes mellitus without complications: Secondary | ICD-10-CM | POA: Diagnosis not present

## 2018-10-07 DIAGNOSIS — E46 Unspecified protein-calorie malnutrition: Secondary | ICD-10-CM | POA: Diagnosis not present

## 2018-10-07 DIAGNOSIS — I4891 Unspecified atrial fibrillation: Secondary | ICD-10-CM | POA: Diagnosis not present

## 2018-10-07 DIAGNOSIS — F0280 Dementia in other diseases classified elsewhere without behavioral disturbance: Secondary | ICD-10-CM | POA: Diagnosis not present

## 2018-10-08 DIAGNOSIS — F339 Major depressive disorder, recurrent, unspecified: Secondary | ICD-10-CM | POA: Diagnosis not present

## 2018-10-08 DIAGNOSIS — E785 Hyperlipidemia, unspecified: Secondary | ICD-10-CM | POA: Diagnosis not present

## 2018-10-08 DIAGNOSIS — I4891 Unspecified atrial fibrillation: Secondary | ICD-10-CM | POA: Diagnosis not present

## 2018-10-08 DIAGNOSIS — R630 Anorexia: Secondary | ICD-10-CM | POA: Diagnosis not present

## 2018-10-08 DIAGNOSIS — I82503 Chronic embolism and thrombosis of unspecified deep veins of lower extremity, bilateral: Secondary | ICD-10-CM | POA: Diagnosis not present

## 2018-10-08 DIAGNOSIS — H04123 Dry eye syndrome of bilateral lacrimal glands: Secondary | ICD-10-CM | POA: Diagnosis not present

## 2018-10-08 DIAGNOSIS — E46 Unspecified protein-calorie malnutrition: Secondary | ICD-10-CM | POA: Diagnosis not present

## 2018-10-08 DIAGNOSIS — K219 Gastro-esophageal reflux disease without esophagitis: Secondary | ICD-10-CM | POA: Diagnosis not present

## 2018-10-08 DIAGNOSIS — I10 Essential (primary) hypertension: Secondary | ICD-10-CM | POA: Diagnosis not present

## 2018-10-08 DIAGNOSIS — F0280 Dementia in other diseases classified elsewhere without behavioral disturbance: Secondary | ICD-10-CM | POA: Diagnosis not present

## 2018-10-08 DIAGNOSIS — N3289 Other specified disorders of bladder: Secondary | ICD-10-CM | POA: Diagnosis not present

## 2018-10-08 DIAGNOSIS — R609 Edema, unspecified: Secondary | ICD-10-CM | POA: Diagnosis not present

## 2018-10-08 DIAGNOSIS — D649 Anemia, unspecified: Secondary | ICD-10-CM | POA: Diagnosis not present

## 2018-10-08 DIAGNOSIS — G2581 Restless legs syndrome: Secondary | ICD-10-CM | POA: Diagnosis not present

## 2018-10-08 DIAGNOSIS — G619 Inflammatory polyneuropathy, unspecified: Secondary | ICD-10-CM | POA: Diagnosis not present

## 2018-10-08 DIAGNOSIS — E039 Hypothyroidism, unspecified: Secondary | ICD-10-CM | POA: Diagnosis not present

## 2018-10-08 DIAGNOSIS — E119 Type 2 diabetes mellitus without complications: Secondary | ICD-10-CM | POA: Diagnosis not present

## 2018-10-11 DIAGNOSIS — E46 Unspecified protein-calorie malnutrition: Secondary | ICD-10-CM | POA: Diagnosis not present

## 2018-10-11 DIAGNOSIS — F0280 Dementia in other diseases classified elsewhere without behavioral disturbance: Secondary | ICD-10-CM | POA: Diagnosis not present

## 2018-10-11 DIAGNOSIS — I4891 Unspecified atrial fibrillation: Secondary | ICD-10-CM | POA: Diagnosis not present

## 2018-10-11 DIAGNOSIS — R630 Anorexia: Secondary | ICD-10-CM | POA: Diagnosis not present

## 2018-10-11 DIAGNOSIS — I10 Essential (primary) hypertension: Secondary | ICD-10-CM | POA: Diagnosis not present

## 2018-10-11 DIAGNOSIS — E119 Type 2 diabetes mellitus without complications: Secondary | ICD-10-CM | POA: Diagnosis not present

## 2018-10-13 DIAGNOSIS — E46 Unspecified protein-calorie malnutrition: Secondary | ICD-10-CM | POA: Diagnosis not present

## 2018-10-13 DIAGNOSIS — R630 Anorexia: Secondary | ICD-10-CM | POA: Diagnosis not present

## 2018-10-13 DIAGNOSIS — I10 Essential (primary) hypertension: Secondary | ICD-10-CM | POA: Diagnosis not present

## 2018-10-13 DIAGNOSIS — I4891 Unspecified atrial fibrillation: Secondary | ICD-10-CM | POA: Diagnosis not present

## 2018-10-13 DIAGNOSIS — F0280 Dementia in other diseases classified elsewhere without behavioral disturbance: Secondary | ICD-10-CM | POA: Diagnosis not present

## 2018-10-13 DIAGNOSIS — E119 Type 2 diabetes mellitus without complications: Secondary | ICD-10-CM | POA: Diagnosis not present

## 2018-10-14 DIAGNOSIS — I4891 Unspecified atrial fibrillation: Secondary | ICD-10-CM | POA: Diagnosis not present

## 2018-10-14 DIAGNOSIS — E119 Type 2 diabetes mellitus without complications: Secondary | ICD-10-CM | POA: Diagnosis not present

## 2018-10-14 DIAGNOSIS — I10 Essential (primary) hypertension: Secondary | ICD-10-CM | POA: Diagnosis not present

## 2018-10-14 DIAGNOSIS — R630 Anorexia: Secondary | ICD-10-CM | POA: Diagnosis not present

## 2018-10-14 DIAGNOSIS — F0280 Dementia in other diseases classified elsewhere without behavioral disturbance: Secondary | ICD-10-CM | POA: Diagnosis not present

## 2018-10-14 DIAGNOSIS — E46 Unspecified protein-calorie malnutrition: Secondary | ICD-10-CM | POA: Diagnosis not present

## 2018-10-15 DIAGNOSIS — I10 Essential (primary) hypertension: Secondary | ICD-10-CM | POA: Diagnosis not present

## 2018-10-15 DIAGNOSIS — F0280 Dementia in other diseases classified elsewhere without behavioral disturbance: Secondary | ICD-10-CM | POA: Diagnosis not present

## 2018-10-15 DIAGNOSIS — R630 Anorexia: Secondary | ICD-10-CM | POA: Diagnosis not present

## 2018-10-15 DIAGNOSIS — E119 Type 2 diabetes mellitus without complications: Secondary | ICD-10-CM | POA: Diagnosis not present

## 2018-10-15 DIAGNOSIS — I4891 Unspecified atrial fibrillation: Secondary | ICD-10-CM | POA: Diagnosis not present

## 2018-10-15 DIAGNOSIS — E46 Unspecified protein-calorie malnutrition: Secondary | ICD-10-CM | POA: Diagnosis not present

## 2018-10-16 DIAGNOSIS — E46 Unspecified protein-calorie malnutrition: Secondary | ICD-10-CM | POA: Diagnosis not present

## 2018-10-16 DIAGNOSIS — F0280 Dementia in other diseases classified elsewhere without behavioral disturbance: Secondary | ICD-10-CM | POA: Diagnosis not present

## 2018-10-16 DIAGNOSIS — I4891 Unspecified atrial fibrillation: Secondary | ICD-10-CM | POA: Diagnosis not present

## 2018-10-16 DIAGNOSIS — I10 Essential (primary) hypertension: Secondary | ICD-10-CM | POA: Diagnosis not present

## 2018-10-16 DIAGNOSIS — R630 Anorexia: Secondary | ICD-10-CM | POA: Diagnosis not present

## 2018-10-16 DIAGNOSIS — E119 Type 2 diabetes mellitus without complications: Secondary | ICD-10-CM | POA: Diagnosis not present

## 2018-10-17 DIAGNOSIS — I4891 Unspecified atrial fibrillation: Secondary | ICD-10-CM | POA: Diagnosis not present

## 2018-10-17 DIAGNOSIS — R630 Anorexia: Secondary | ICD-10-CM | POA: Diagnosis not present

## 2018-10-17 DIAGNOSIS — E46 Unspecified protein-calorie malnutrition: Secondary | ICD-10-CM | POA: Diagnosis not present

## 2018-10-17 DIAGNOSIS — I10 Essential (primary) hypertension: Secondary | ICD-10-CM | POA: Diagnosis not present

## 2018-10-17 DIAGNOSIS — E119 Type 2 diabetes mellitus without complications: Secondary | ICD-10-CM | POA: Diagnosis not present

## 2018-10-17 DIAGNOSIS — F0280 Dementia in other diseases classified elsewhere without behavioral disturbance: Secondary | ICD-10-CM | POA: Diagnosis not present

## 2018-10-19 DIAGNOSIS — I4891 Unspecified atrial fibrillation: Secondary | ICD-10-CM | POA: Diagnosis not present

## 2018-10-19 DIAGNOSIS — E119 Type 2 diabetes mellitus without complications: Secondary | ICD-10-CM | POA: Diagnosis not present

## 2018-10-19 DIAGNOSIS — I10 Essential (primary) hypertension: Secondary | ICD-10-CM | POA: Diagnosis not present

## 2018-10-19 DIAGNOSIS — R630 Anorexia: Secondary | ICD-10-CM | POA: Diagnosis not present

## 2018-10-19 DIAGNOSIS — E46 Unspecified protein-calorie malnutrition: Secondary | ICD-10-CM | POA: Diagnosis not present

## 2018-10-19 DIAGNOSIS — F0280 Dementia in other diseases classified elsewhere without behavioral disturbance: Secondary | ICD-10-CM | POA: Diagnosis not present

## 2018-10-20 DIAGNOSIS — E119 Type 2 diabetes mellitus without complications: Secondary | ICD-10-CM | POA: Diagnosis not present

## 2018-10-20 DIAGNOSIS — F0280 Dementia in other diseases classified elsewhere without behavioral disturbance: Secondary | ICD-10-CM | POA: Diagnosis not present

## 2018-10-20 DIAGNOSIS — E46 Unspecified protein-calorie malnutrition: Secondary | ICD-10-CM | POA: Diagnosis not present

## 2018-10-20 DIAGNOSIS — R630 Anorexia: Secondary | ICD-10-CM | POA: Diagnosis not present

## 2018-10-20 DIAGNOSIS — I4891 Unspecified atrial fibrillation: Secondary | ICD-10-CM | POA: Diagnosis not present

## 2018-10-20 DIAGNOSIS — I10 Essential (primary) hypertension: Secondary | ICD-10-CM | POA: Diagnosis not present

## 2018-10-21 ENCOUNTER — Telehealth: Payer: Self-pay | Admitting: Family Medicine

## 2018-10-21 DIAGNOSIS — F0280 Dementia in other diseases classified elsewhere without behavioral disturbance: Secondary | ICD-10-CM | POA: Diagnosis not present

## 2018-10-21 DIAGNOSIS — R630 Anorexia: Secondary | ICD-10-CM | POA: Diagnosis not present

## 2018-10-21 DIAGNOSIS — E119 Type 2 diabetes mellitus without complications: Secondary | ICD-10-CM | POA: Diagnosis not present

## 2018-10-21 DIAGNOSIS — I4891 Unspecified atrial fibrillation: Secondary | ICD-10-CM | POA: Diagnosis not present

## 2018-10-21 DIAGNOSIS — E46 Unspecified protein-calorie malnutrition: Secondary | ICD-10-CM | POA: Diagnosis not present

## 2018-10-21 DIAGNOSIS — I10 Essential (primary) hypertension: Secondary | ICD-10-CM | POA: Diagnosis not present

## 2018-10-21 NOTE — Telephone Encounter (Signed)
Copied from [place] [phone]. Topic: Quick Communication - See Telephone Encounter >> Oct 21, 2018  4:31 PM Blase Mess A wrote: CRM for notification. See Telephone encounter for: 10/21/18. [place] Patient has been taking furosemide (LASIX) 20 MG tablet [[id number]]   Patient is not eating. And Rickey Barbara RN is thinking that is fatiguing her more. Wanting to know if she can come off the medication Please advise [phone]

## 2018-10-21 NOTE — Telephone Encounter (Signed)
See note

## 2018-10-22 NOTE — Telephone Encounter (Signed)
She was only supposed to be taking 10 mg. Okay to hold and monitor weight.

## 2018-10-22 NOTE — Telephone Encounter (Signed)
Please advise 

## 2018-10-22 NOTE — Telephone Encounter (Signed)
Spoke to [place] gave orders will monitor her weight and hold meds.

## 2018-10-24 ENCOUNTER — Encounter: Payer: Self-pay | Admitting: Family Medicine

## 2018-10-24 ENCOUNTER — Other Ambulatory Visit: Payer: Self-pay | Admitting: Family Medicine

## 2018-10-24 DIAGNOSIS — Z515 Encounter for palliative care: Secondary | ICD-10-CM | POA: Insufficient documentation

## 2018-10-25 DIAGNOSIS — E46 Unspecified protein-calorie malnutrition: Secondary | ICD-10-CM | POA: Diagnosis not present

## 2018-10-25 DIAGNOSIS — F0280 Dementia in other diseases classified elsewhere without behavioral disturbance: Secondary | ICD-10-CM | POA: Diagnosis not present

## 2018-10-25 DIAGNOSIS — E119 Type 2 diabetes mellitus without complications: Secondary | ICD-10-CM | POA: Diagnosis not present

## 2018-10-25 DIAGNOSIS — R630 Anorexia: Secondary | ICD-10-CM | POA: Diagnosis not present

## 2018-10-25 DIAGNOSIS — I4891 Unspecified atrial fibrillation: Secondary | ICD-10-CM | POA: Diagnosis not present

## 2018-10-25 DIAGNOSIS — I10 Essential (primary) hypertension: Secondary | ICD-10-CM | POA: Diagnosis not present

## 2018-10-27 DIAGNOSIS — E119 Type 2 diabetes mellitus without complications: Secondary | ICD-10-CM | POA: Diagnosis not present

## 2018-10-27 DIAGNOSIS — I4891 Unspecified atrial fibrillation: Secondary | ICD-10-CM | POA: Diagnosis not present

## 2018-10-27 DIAGNOSIS — F0280 Dementia in other diseases classified elsewhere without behavioral disturbance: Secondary | ICD-10-CM | POA: Diagnosis not present

## 2018-10-27 DIAGNOSIS — I10 Essential (primary) hypertension: Secondary | ICD-10-CM | POA: Diagnosis not present

## 2018-10-27 DIAGNOSIS — E46 Unspecified protein-calorie malnutrition: Secondary | ICD-10-CM | POA: Diagnosis not present

## 2018-10-27 DIAGNOSIS — R630 Anorexia: Secondary | ICD-10-CM | POA: Diagnosis not present

## 2018-10-28 ENCOUNTER — Other Ambulatory Visit: Payer: Self-pay | Admitting: Family Medicine

## 2018-10-28 DIAGNOSIS — F0280 Dementia in other diseases classified elsewhere without behavioral disturbance: Secondary | ICD-10-CM | POA: Diagnosis not present

## 2018-10-28 DIAGNOSIS — I4891 Unspecified atrial fibrillation: Secondary | ICD-10-CM | POA: Diagnosis not present

## 2018-10-28 DIAGNOSIS — I10 Essential (primary) hypertension: Secondary | ICD-10-CM | POA: Diagnosis not present

## 2018-10-28 DIAGNOSIS — E46 Unspecified protein-calorie malnutrition: Secondary | ICD-10-CM | POA: Diagnosis not present

## 2018-10-28 DIAGNOSIS — E119 Type 2 diabetes mellitus without complications: Secondary | ICD-10-CM | POA: Diagnosis not present

## 2018-10-28 DIAGNOSIS — R630 Anorexia: Secondary | ICD-10-CM | POA: Diagnosis not present

## 2018-10-29 DIAGNOSIS — R630 Anorexia: Secondary | ICD-10-CM | POA: Diagnosis not present

## 2018-10-29 DIAGNOSIS — I10 Essential (primary) hypertension: Secondary | ICD-10-CM | POA: Diagnosis not present

## 2018-10-29 DIAGNOSIS — E46 Unspecified protein-calorie malnutrition: Secondary | ICD-10-CM | POA: Diagnosis not present

## 2018-10-29 DIAGNOSIS — F0280 Dementia in other diseases classified elsewhere without behavioral disturbance: Secondary | ICD-10-CM | POA: Diagnosis not present

## 2018-10-29 DIAGNOSIS — E119 Type 2 diabetes mellitus without complications: Secondary | ICD-10-CM | POA: Diagnosis not present

## 2018-10-29 DIAGNOSIS — I4891 Unspecified atrial fibrillation: Secondary | ICD-10-CM | POA: Diagnosis not present

## 2018-10-30 DIAGNOSIS — F0280 Dementia in other diseases classified elsewhere without behavioral disturbance: Secondary | ICD-10-CM | POA: Diagnosis not present

## 2018-10-30 DIAGNOSIS — I4891 Unspecified atrial fibrillation: Secondary | ICD-10-CM | POA: Diagnosis not present

## 2018-10-30 DIAGNOSIS — I10 Essential (primary) hypertension: Secondary | ICD-10-CM | POA: Diagnosis not present

## 2018-10-30 DIAGNOSIS — R630 Anorexia: Secondary | ICD-10-CM | POA: Diagnosis not present

## 2018-10-30 DIAGNOSIS — E119 Type 2 diabetes mellitus without complications: Secondary | ICD-10-CM | POA: Diagnosis not present

## 2018-10-30 DIAGNOSIS — E46 Unspecified protein-calorie malnutrition: Secondary | ICD-10-CM | POA: Diagnosis not present

## 2018-11-01 DIAGNOSIS — I10 Essential (primary) hypertension: Secondary | ICD-10-CM | POA: Diagnosis not present

## 2018-11-01 DIAGNOSIS — R630 Anorexia: Secondary | ICD-10-CM | POA: Diagnosis not present

## 2018-11-01 DIAGNOSIS — F0280 Dementia in other diseases classified elsewhere without behavioral disturbance: Secondary | ICD-10-CM | POA: Diagnosis not present

## 2018-11-01 DIAGNOSIS — I4891 Unspecified atrial fibrillation: Secondary | ICD-10-CM | POA: Diagnosis not present

## 2018-11-01 DIAGNOSIS — E119 Type 2 diabetes mellitus without complications: Secondary | ICD-10-CM | POA: Diagnosis not present

## 2018-11-01 DIAGNOSIS — E46 Unspecified protein-calorie malnutrition: Secondary | ICD-10-CM | POA: Diagnosis not present

## 2018-11-03 DIAGNOSIS — F0280 Dementia in other diseases classified elsewhere without behavioral disturbance: Secondary | ICD-10-CM | POA: Diagnosis not present

## 2018-11-03 DIAGNOSIS — E46 Unspecified protein-calorie malnutrition: Secondary | ICD-10-CM | POA: Diagnosis not present

## 2018-11-03 DIAGNOSIS — R630 Anorexia: Secondary | ICD-10-CM | POA: Diagnosis not present

## 2018-11-03 DIAGNOSIS — I4891 Unspecified atrial fibrillation: Secondary | ICD-10-CM | POA: Diagnosis not present

## 2018-11-03 DIAGNOSIS — E119 Type 2 diabetes mellitus without complications: Secondary | ICD-10-CM | POA: Diagnosis not present

## 2018-11-03 DIAGNOSIS — I10 Essential (primary) hypertension: Secondary | ICD-10-CM | POA: Diagnosis not present

## 2018-11-05 DIAGNOSIS — R630 Anorexia: Secondary | ICD-10-CM | POA: Diagnosis not present

## 2018-11-05 DIAGNOSIS — E46 Unspecified protein-calorie malnutrition: Secondary | ICD-10-CM | POA: Diagnosis not present

## 2018-11-05 DIAGNOSIS — F0280 Dementia in other diseases classified elsewhere without behavioral disturbance: Secondary | ICD-10-CM | POA: Diagnosis not present

## 2018-11-05 DIAGNOSIS — I4891 Unspecified atrial fibrillation: Secondary | ICD-10-CM | POA: Diagnosis not present

## 2018-11-05 DIAGNOSIS — I10 Essential (primary) hypertension: Secondary | ICD-10-CM | POA: Diagnosis not present

## 2018-11-05 DIAGNOSIS — E119 Type 2 diabetes mellitus without complications: Secondary | ICD-10-CM | POA: Diagnosis not present

## 2018-12-07 ENCOUNTER — Telehealth: Payer: Self-pay | Admitting: Family Medicine

## 2018-12-07 DIAGNOSIS — M179 Osteoarthritis of knee, unspecified: Secondary | ICD-10-CM

## 2018-12-07 DIAGNOSIS — M1710 Unilateral primary osteoarthritis, unspecified knee: Secondary | ICD-10-CM

## 2018-12-07 NOTE — Telephone Encounter (Signed)
Pt calling in.  States that pharmacy states that this medication is not covered under insurance.  Pt wants to know if there is something else that she can have that would be comparable.

## 2018-12-09 NOTE — Telephone Encounter (Signed)
OTC okay. Make sure patient schedules follow up appt.

## 2018-12-09 NOTE — Telephone Encounter (Signed)
See note

## 2018-12-10 NOTE — Telephone Encounter (Signed)
Called pt to update her regarding her Rx and she hung the phone up. Not sure if it was an accidental. Called pt Granddaughter who's on file but she stated they haven't talked in months due to family issues. Granddaughter advised me to try again in a couple of hours due to pt not liking to be called in the mornings. Will try again later.

## 2018-12-10 NOTE — Telephone Encounter (Signed)
Spoke to pt and she stated that she feels horrible due to edema. Pt was scheduled to come to the office 12/13/2018. Pt was also updated on Rx issue.

## 2018-12-12 NOTE — Progress Notes (Signed)
[name] is a 85 y.o. female is here for follow up.  History of Present Illness:   [name], CMA, scribe for Dr. Juleen China.   HPI: Pt present for "weakness" pt stated she hasnt been able to sleep well. Pt is eating w/o any issues. No falls. Saw hospice doctor and nurse last week.   Health Maintenance Due  Topic Date Due  . OPHTHALMOLOGY EXAM  07/24/1931  . INFLUENZA VACCINE  07/08/2018  . FOOT EXAM  11/03/2018   Depression screen PHQ 2/9 11/03/2017  Decreased Interest 0  Down, Depressed, Hopeless 0  PHQ - 2 Score 0   PMHx, SurgHx, SocialHx, FamHx, Medications, and Allergies were reviewed in the Visit Navigator and updated as appropriate.   Patient Active Problem List   Diagnosis Date Noted  . Hospice care patient 10/24/2018  . Auditory hallucinations 12/25/2017  . Altered mental status   . DNR (do not resuscitate) 12/12/2017  . CKD (chronic kidney disease), stage III ([place]) 11/22/2017  . Hypertension 11/22/2017  . Atrial fibrillation, chronic 11/22/2017  . Depression 11/22/2017  . Protein-calorie malnutrition, moderate ([place]) 11/22/2017  . Dry skin dermatitis 09/24/2017  . Osteoarthritis 07/31/2017  . CKD stage 4 due to type 2 diabetes mellitus ([place]) 04/19/2017  . Iron deficiency anemia 04/19/2017  . B12 deficiency 04/08/2017  . Chronic constipation 04/08/2017  . Chronic deep vein thrombosis (DVT) of left lower extremity ([place]) 04/08/2017  . Monoclonal gammopathy of unknown significance (MGUS) 04/08/2017  . Peripheral neuropathy 04/08/2017  . Rectal mass   . Pancreas divisum 03/02/2015  . Physical deconditioning 03/01/2015  . Pancreatic cyst   . Hiatal hernia- moderate to large 10/06/2014  . Heme + stool 10/04/2014  . Crohn's ileocolitis ([place]) 10/04/2014  . 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 Arthritis   . Hx pulmonary embolism   . Hypothyroidism 04/11/2014  . Type 2 diabetes mellitus with stage 3 chronic kidney disease ([place]) 04/11/2014  . Anemia of chronic  disease 04/03/2014  . OAB (overactive bladder) 01/07/2012  . Restless leg syndrome 03/09/2008  . Benign essential hypertension 03/09/2008  . ESOPHAGEAL STRICTURE 03/09/2008  . GERD (gastroesophageal reflux disease) 03/09/2008  . Diverticulosis of large intestine 03/09/2008  . Polymyalgia rheumatica ([place]) 03/09/2008   Social History   Tobacco Use  . Smoking status: Never Smoker  . Smokeless tobacco: Never Used  Substance Use Topics  . Alcohol use: No  . Drug use: No   Current Medications and Allergies:   .  lidocaine (LIDODERM) 4 %, PLACE 1 PATCH ONTO AFFECTED AREA(S) EVERY 12 HOURS FOR PAIN RELIEF, Disp: 30 patch, Rfl: 0 .  ropinirole (REQUIP) 5 MG tablet, TAKE ONE TABLET BY MOUTH EVERY NIGHT AT BEDTIME, Disp: 30 tablet, Rfl: 0   Allergies  Allergen Reactions  . Sulfa Antibiotics Nausea And Vomiting  . Penicillins Hives and Rash    ++tolerates Ceftriaxone and keflex++ Has patient had a PCN reaction causing immediate rash, facial/tongue/throat swelling, SOB or lightheadedness with hypotension: Unknown Has patient had a PCN reaction causing severe rash involving mucus membranes or skin necrosis: Unknown Has patient had a PCN reaction that required hospitalization Unknown Has patient had a PCN reaction occurring within the last 10 years: No If all of the above answers are "NO", then may proceed with Cephalosporin use.    Review of Systems   Pertinent items are noted in the HPI. Otherwise, a complete ROS is negative.  Vitals:   Vitals:   12/13/18 1211  BP: 100/68  Pulse:  67  Temp: (!) 95.6 F (35.3 C)  TempSrc: Oral  SpO2: 98%  Height: 5' 1"  (1.549 m)     Body mass index is 21.84 kg/m.  Physical Exam:   Physical Exam Vitals signs and nursing note reviewed.  HENT:     Head: Normocephalic and atraumatic.  Eyes:     Pupils: Pupils are equal, round, and reactive to light.  Neck:     Musculoskeletal: Normal range of motion and neck supple.  Cardiovascular:      Rate and Rhythm: Normal rate and regular rhythm.     Heart sounds: Normal heart sounds.  Pulmonary:     Effort: Pulmonary effort is normal.  Abdominal:     Palpations: Abdomen is soft.  Skin:    General: Skin is warm.  Psychiatric:        Behavior: Behavior normal.    Results for orders placed or performed in visit on 09/07/18  Comprehensive metabolic panel  Result Value Ref Range   Sodium 141 135 - 145 mEq/L   Potassium 4.2 3.5 - 5.1 mEq/L   Chloride 104 96 - 112 mEq/L   CO2 25 19 - 32 mEq/L   Glucose, Bld 134 (H) 70 - 99 mg/dL   BUN 41 (H) 6 - 23 mg/dL   Creatinine, Ser 1.61 (H) 0.40 - 1.20 mg/dL   Total Bilirubin 0.4 0.2 - 1.2 mg/dL   Alkaline Phosphatase 129 (H) 39 - 117 U/L   AST 20 0 - 37 U/L   ALT 9 0 - 35 U/L   Total Protein 6.4 6.0 - 8.3 g/dL   Albumin 3.8 3.5 - 5.2 g/dL   Calcium 9.0 8.4 - 10.5 mg/dL   GFR 31.46 (L) >60.00 mL/min    Assessment and Plan:   Diagnoses and all orders for this visit:  Physical deconditioning, fall precautions reviewed.   Hospice care patient, will clarify physician responsibility and give primary care over to hospice if appropriate.   DNR (do not resuscitate)  Moderate episode of recurrent major depressive disorder ([place])  . Orders and follow up as documented in [place], reviewed diet, exercise and weight control, cardiovascular risk and specific lipid/LDL goals reviewed, reviewed medications and side effects in detail.  . Reviewed expectations re: course of current medical issues. . Outlined signs and symptoms indicating need for more acute intervention. . Patient verbalized understanding and all questions were answered. . Patient received an After Visit Summary.  CMA served as Education administrator during this visit. History, Physical, and Plan performed by medical provider. The above documentation has been reviewed and is accurate and complete. Briscoe Deutscher, D.O.  Briscoe Deutscher, DO [place], [place] 12/13/2018

## 2018-12-13 ENCOUNTER — Ambulatory Visit (INDEPENDENT_AMBULATORY_CARE_PROVIDER_SITE_OTHER): Payer: Medicare Other | Admitting: Family Medicine

## 2018-12-13 ENCOUNTER — Encounter: Payer: Self-pay | Admitting: Family Medicine

## 2018-12-13 ENCOUNTER — Other Ambulatory Visit: Payer: Self-pay | Admitting: Family Medicine

## 2018-12-13 VITALS — BP 100/68 | HR 67 | Temp 95.6°F | Ht 61.0 in

## 2018-12-13 DIAGNOSIS — Z515 Encounter for palliative care: Secondary | ICD-10-CM | POA: Diagnosis not present

## 2018-12-13 DIAGNOSIS — R5381 Other malaise: Secondary | ICD-10-CM | POA: Diagnosis not present

## 2018-12-13 DIAGNOSIS — F331 Major depressive disorder, recurrent, moderate: Secondary | ICD-10-CM | POA: Diagnosis not present

## 2018-12-13 DIAGNOSIS — Z66 Do not resuscitate: Secondary | ICD-10-CM

## 2018-12-27 DIAGNOSIS — R609 Edema, unspecified: Secondary | ICD-10-CM | POA: Insufficient documentation

## 2018-12-27 DIAGNOSIS — R600 Localized edema: Secondary | ICD-10-CM | POA: Insufficient documentation

## 2018-12-27 DIAGNOSIS — I82409 Acute embolism and thrombosis of unspecified deep veins of unspecified lower extremity: Secondary | ICD-10-CM | POA: Insufficient documentation

## 2018-12-27 DIAGNOSIS — F0390 Unspecified dementia without behavioral disturbance: Secondary | ICD-10-CM | POA: Insufficient documentation

## 2018-12-27 DIAGNOSIS — R630 Anorexia: Secondary | ICD-10-CM | POA: Insufficient documentation

## 2019-01-09 ENCOUNTER — Encounter: Payer: Self-pay | Admitting: Family Medicine

## 2019-01-09 ENCOUNTER — Other Ambulatory Visit: Payer: Self-pay | Admitting: Family Medicine

## 2019-01-09 MED ORDER — DOXYCYCLINE HYCLATE 100 MG PO TABS: 100.0000 mg | ORAL_TABLET | Freq: Two times a day (BID) | ORAL | 0 refills | Status: DC

## 2019-01-09 MED ORDER — DICLOFENAC SODIUM 1 % TD GEL: 4.0000 g | Freq: Four times a day (QID) | TRANSDERMAL | 3 refills | Status: AC | PRN

## 2019-01-09 NOTE — Progress Notes (Signed)
Received call from team health and was transferred to patient's hospice nurse. Stated that patient started having fever to 100.38F with a barking cough earlier today. She additionally has associated left neck pain and ronchi on her lung exam. Per nurse, patient is very uncomfortable and requests refill on her topical diclofenac. Patient is currently under hospice palliative care and does not want ED visits or hospitalizations. In light of this, I sent in a prescription for doxycycline 100mg  bid x7days to empirically treat for any possible pneumonia - hopefully this will help make her more comfortable as well. Recommended close follow up with PCP or urgent care soon for further evaluation and to be tested for flu. Hospice RN voiced understanding.   Will also refill her topical diclofenac.

## 2019-01-10 ENCOUNTER — Other Ambulatory Visit: Payer: Self-pay

## 2019-01-10 ENCOUNTER — Telehealth: Payer: Self-pay | Admitting: Family Medicine

## 2019-01-10 ENCOUNTER — Encounter (HOSPITAL_COMMUNITY): Payer: Self-pay

## 2019-01-10 ENCOUNTER — Emergency Department (HOSPITAL_COMMUNITY)

## 2019-01-10 ENCOUNTER — Inpatient Hospital Stay (HOSPITAL_COMMUNITY)
Admission: EM | Admit: 2019-01-10 | Discharge: 2019-01-14 | DRG: 193 | Disposition: A | Discharge status: Hospice | Attending: Internal Medicine | Admitting: Internal Medicine

## 2019-01-10 DIAGNOSIS — Z880 Allergy status to penicillin: Secondary | ICD-10-CM

## 2019-01-10 DIAGNOSIS — D638 Anemia in other chronic diseases classified elsewhere: Secondary | ICD-10-CM | POA: Diagnosis present

## 2019-01-10 DIAGNOSIS — R000 Tachycardia, unspecified: Secondary | ICD-10-CM | POA: Diagnosis present

## 2019-01-10 DIAGNOSIS — J181 Lobar pneumonia, unspecified organism: Secondary | ICD-10-CM

## 2019-01-10 DIAGNOSIS — Z515 Encounter for palliative care: Secondary | ICD-10-CM | POA: Diagnosis present

## 2019-01-10 DIAGNOSIS — E11649 Type 2 diabetes mellitus with hypoglycemia without coma: Secondary | ICD-10-CM | POA: Diagnosis not present

## 2019-01-10 DIAGNOSIS — I10 Essential (primary) hypertension: Secondary | ICD-10-CM | POA: Diagnosis present

## 2019-01-10 DIAGNOSIS — K5090 Crohn's disease, unspecified, without complications: Secondary | ICD-10-CM | POA: Diagnosis present

## 2019-01-10 DIAGNOSIS — F0390 Unspecified dementia without behavioral disturbance: Secondary | ICD-10-CM | POA: Diagnosis present

## 2019-01-10 DIAGNOSIS — F329 Major depressive disorder, single episode, unspecified: Secondary | ICD-10-CM

## 2019-01-10 DIAGNOSIS — E1122 Type 2 diabetes mellitus with diabetic chronic kidney disease: Secondary | ICD-10-CM | POA: Diagnosis present

## 2019-01-10 DIAGNOSIS — Z79899 Other long term (current) drug therapy: Secondary | ICD-10-CM

## 2019-01-10 DIAGNOSIS — Z8619 Personal history of other infectious and parasitic diseases: Secondary | ICD-10-CM

## 2019-01-10 DIAGNOSIS — E876 Hypokalemia: Secondary | ICD-10-CM | POA: Diagnosis present

## 2019-01-10 DIAGNOSIS — Z882 Allergy status to sulfonamides status: Secondary | ICD-10-CM

## 2019-01-10 DIAGNOSIS — I129 Hypertensive chronic kidney disease with stage 1 through stage 4 chronic kidney disease, or unspecified chronic kidney disease: Secondary | ICD-10-CM | POA: Diagnosis present

## 2019-01-10 DIAGNOSIS — J189 Pneumonia, unspecified organism: Secondary | ICD-10-CM | POA: Diagnosis not present

## 2019-01-10 DIAGNOSIS — M19041 Primary osteoarthritis, right hand: Secondary | ICD-10-CM | POA: Diagnosis present

## 2019-01-10 DIAGNOSIS — Z66 Do not resuscitate: Secondary | ICD-10-CM | POA: Diagnosis present

## 2019-01-10 DIAGNOSIS — L8990 Pressure ulcer of unspecified site, unspecified stage: Secondary | ICD-10-CM

## 2019-01-10 DIAGNOSIS — L89151 Pressure ulcer of sacral region, stage 1: Secondary | ICD-10-CM | POA: Diagnosis present

## 2019-01-10 DIAGNOSIS — R0602 Shortness of breath: Secondary | ICD-10-CM | POA: Diagnosis not present

## 2019-01-10 DIAGNOSIS — G2581 Restless legs syndrome: Secondary | ICD-10-CM | POA: Diagnosis present

## 2019-01-10 DIAGNOSIS — I4891 Unspecified atrial fibrillation: Secondary | ICD-10-CM | POA: Diagnosis present

## 2019-01-10 DIAGNOSIS — Z86718 Personal history of other venous thrombosis and embolism: Secondary | ICD-10-CM

## 2019-01-10 DIAGNOSIS — N183 Chronic kidney disease, stage 3 unspecified: Secondary | ICD-10-CM | POA: Diagnosis present

## 2019-01-10 DIAGNOSIS — N184 Chronic kidney disease, stage 4 (severe): Secondary | ICD-10-CM | POA: Diagnosis present

## 2019-01-10 DIAGNOSIS — G4730 Sleep apnea, unspecified: Secondary | ICD-10-CM | POA: Diagnosis present

## 2019-01-10 DIAGNOSIS — E039 Hypothyroidism, unspecified: Secondary | ICD-10-CM | POA: Diagnosis present

## 2019-01-10 DIAGNOSIS — E785 Hyperlipidemia, unspecified: Secondary | ICD-10-CM | POA: Diagnosis present

## 2019-01-10 DIAGNOSIS — Z86711 Personal history of pulmonary embolism: Secondary | ICD-10-CM

## 2019-01-10 DIAGNOSIS — M353 Polymyalgia rheumatica: Secondary | ICD-10-CM | POA: Diagnosis present

## 2019-01-10 DIAGNOSIS — K219 Gastro-esophageal reflux disease without esophagitis: Secondary | ICD-10-CM | POA: Diagnosis present

## 2019-01-10 DIAGNOSIS — N3281 Overactive bladder: Secondary | ICD-10-CM | POA: Diagnosis present

## 2019-01-10 DIAGNOSIS — J9601 Acute respiratory failure with hypoxia: Secondary | ICD-10-CM | POA: Diagnosis present

## 2019-01-10 DIAGNOSIS — R627 Adult failure to thrive: Secondary | ICD-10-CM | POA: Diagnosis present

## 2019-01-10 DIAGNOSIS — Z8719 Personal history of other diseases of the digestive system: Secondary | ICD-10-CM

## 2019-01-10 LAB — CBC WITH DIFFERENTIAL/PLATELET
Abs Immature Granulocytes: 0.04 10*3/uL (ref 0.00–0.07)
Basophils Absolute: 0 10*3/uL (ref 0.0–0.1)
Basophils Relative: 1 %
Eosinophils Absolute: 0 10*3/uL (ref 0.0–0.5)
Eosinophils Relative: 1 %
HCT: 39.3 % (ref 36.0–46.0)
HEMOGLOBIN: 11.9 g/dL — AB (ref 12.0–15.0)
Immature Granulocytes: 1 %
Lymphocytes Relative: 25 %
Lymphs Abs: 2.2 10*3/uL (ref 0.7–4.0)
MCH: 29.8 pg (ref 26.0–34.0)
MCHC: 30.3 g/dL (ref 30.0–36.0)
MCV: 98.5 fL (ref 80.0–100.0)
Monocytes Absolute: 0.4 10*3/uL (ref 0.1–1.0)
Monocytes Relative: 5 %
NEUTROS ABS: 6 10*3/uL (ref 1.7–7.7)
Neutrophils Relative %: 67 %
Platelets: 199 10*3/uL (ref 150–400)
RBC: 3.99 MIL/uL (ref 3.87–5.11)
RDW: 13.2 % (ref 11.5–15.5)
WBC: 8.7 10*3/uL (ref 4.0–10.5)
nRBC: 0 % (ref 0.0–0.2)

## 2019-01-10 LAB — COMPREHENSIVE METABOLIC PANEL
ALBUMIN: 3.4 g/dL — AB (ref 3.5–5.0)
ALT: 12 U/L (ref 0–44)
AST: 24 U/L (ref 15–41)
Alkaline Phosphatase: 136 U/L — ABNORMAL HIGH (ref 38–126)
Anion gap: 11 (ref 5–15)
BILIRUBIN TOTAL: 0.3 mg/dL (ref 0.3–1.2)
BUN: 22 mg/dL (ref 8–23)
CO2: 23 mmol/L (ref 22–32)
Calcium: 8.6 mg/dL — ABNORMAL LOW (ref 8.9–10.3)
Chloride: 103 mmol/L (ref 98–111)
Creatinine, Ser: 1.21 mg/dL — ABNORMAL HIGH (ref 0.44–1.00)
GFR calc Af Amer: 43 mL/min — ABNORMAL LOW (ref >60)
GFR calc non Af Amer: 37 mL/min — ABNORMAL LOW (ref >60)
GLUCOSE: 233 mg/dL — AB (ref 70–99)
Potassium: 3.2 mmol/L — ABNORMAL LOW (ref 3.5–5.1)
Sodium: 137 mmol/L (ref 135–145)
Total Protein: 7 g/dL (ref 6.5–8.1)

## 2019-01-10 LAB — INFLUENZA PANEL BY PCR (TYPE A & B)
Influenza A By PCR: NEGATIVE
Influenza B By PCR: NEGATIVE

## 2019-01-10 LAB — LACTIC ACID, PLASMA: Lactic Acid, Venous: 2.5 mmol/L (ref 0.5–1.9)

## 2019-01-10 LAB — BRAIN NATRIURETIC PEPTIDE: B Natriuretic Peptide: 152.3 pg/mL — ABNORMAL HIGH (ref 0.0–100.0)

## 2019-01-10 LAB — MRSA PCR SCREENING: MRSA by PCR: NEGATIVE

## 2019-01-10 MED ORDER — POTASSIUM CHLORIDE 20 MEQ/15ML (10%) PO SOLN
40.0000 meq | Freq: Once | ORAL | Status: AC
  Administered 2019-01-10: 40 meq via ORAL
  Filled 2019-01-10: qty 30

## 2019-01-10 MED ORDER — MORPHINE SULFATE (PF) 2 MG/ML IV SOLN
0.5000 mg | INTRAVENOUS | Status: DC | PRN
  Administered 2019-01-13: 0.5 mg via INTRAVENOUS
  Filled 2019-01-10: qty 1

## 2019-01-10 MED ORDER — LEVOFLOXACIN IN D5W 500 MG/100ML IV SOLN
500.0000 mg | Freq: Once | INTRAVENOUS | Status: AC
  Administered 2019-01-10: 500 mg via INTRAVENOUS
  Filled 2019-01-10: qty 100

## 2019-01-10 MED ORDER — ACETAMINOPHEN 325 MG PO TABS
650.0000 mg | ORAL_TABLET | Freq: Four times a day (QID) | ORAL | Status: DC | PRN
  Administered 2019-01-11 – 2019-01-12 (×2): 650 mg via ORAL
  Filled 2019-01-10 (×3): qty 2

## 2019-01-10 MED ORDER — SODIUM CHLORIDE 0.9 % IV SOLN
500.0000 mg | INTRAVENOUS | Status: DC
  Administered 2019-01-11 – 2019-01-14 (×4): 500 mg via INTRAVENOUS
  Filled 2019-01-10 (×4): qty 500

## 2019-01-10 MED ORDER — LIDOCAINE 5 % EX PTCH
1.0000 | MEDICATED_PATCH | CUTANEOUS | Status: DC
  Administered 2019-01-10 – 2019-01-13 (×4): 1 via TRANSDERMAL
  Filled 2019-01-10 (×5): qty 1

## 2019-01-10 MED ORDER — METOPROLOL TARTRATE 5 MG/5ML IV SOLN: 2.5000 mg | Freq: Four times a day (QID) | INTRAVENOUS | Status: DC | PRN

## 2019-01-10 MED ORDER — SODIUM CHLORIDE 0.9 % IV SOLN
1.0000 g | INTRAVENOUS | Status: DC
  Administered 2019-01-10 – 2019-01-14 (×5): 1 g via INTRAVENOUS
  Filled 2019-01-10 (×4): qty 1
  Filled 2019-01-10: qty 10

## 2019-01-10 MED ORDER — ONDANSETRON HCL 4 MG/2ML IJ SOLN: 4.0000 mg | Freq: Four times a day (QID) | INTRAMUSCULAR | Status: DC | PRN

## 2019-01-10 MED ORDER — MAGNESIUM SULFATE 2 GM/50ML IV SOLN
2.0000 g | Freq: Once | INTRAVENOUS | Status: AC
  Administered 2019-01-10: 2 g via INTRAVENOUS
  Filled 2019-01-10: qty 50

## 2019-01-10 MED ORDER — ACETAMINOPHEN 500 MG PO TABS
1000.0000 mg | ORAL_TABLET | Freq: Once | ORAL | Status: AC
  Administered 2019-01-10: 1000 mg via ORAL
  Filled 2019-01-10: qty 2

## 2019-01-10 MED ORDER — ACETAMINOPHEN 650 MG RE SUPP: 650.0000 mg | Freq: Four times a day (QID) | RECTAL | Status: DC | PRN

## 2019-01-10 MED ORDER — INSULIN ASPART 100 UNIT/ML ~~LOC~~ SOLN: 0.0000 [IU] | Freq: Three times a day (TID) | SUBCUTANEOUS | Status: DC

## 2019-01-10 MED ORDER — SODIUM CHLORIDE 0.9 % IV BOLUS
500.0000 mL | Freq: Once | INTRAVENOUS | Status: AC
  Administered 2019-01-10: 500 mL via INTRAVENOUS

## 2019-01-10 MED ORDER — ENOXAPARIN SODIUM 30 MG/0.3ML ~~LOC~~ SOLN
30.0000 mg | SUBCUTANEOUS | Status: DC
  Administered 2019-01-10: 30 mg via SUBCUTANEOUS
  Filled 2019-01-10: qty 0.3

## 2019-01-10 MED ORDER — PANTOPRAZOLE SODIUM 40 MG PO TBEC
40.0000 mg | DELAYED_RELEASE_TABLET | Freq: Every day | ORAL | Status: DC
  Administered 2019-01-11 – 2019-01-14 (×4): 40 mg via ORAL
  Filled 2019-01-10 (×4): qty 1

## 2019-01-10 MED ORDER — POTASSIUM CHLORIDE CRYS ER 20 MEQ PO TBCR: 40.0000 meq | EXTENDED_RELEASE_TABLET | Freq: Once | ORAL | Status: DC

## 2019-01-10 MED ORDER — GUAIFENESIN-DM 100-10 MG/5ML PO SYRP: 5.0000 mL | ORAL_SOLUTION | ORAL | Status: DC | PRN

## 2019-01-10 MED ORDER — ONDANSETRON HCL 4 MG PO TABS: 4.0000 mg | ORAL_TABLET | Freq: Four times a day (QID) | ORAL | Status: DC | PRN

## 2019-01-10 MED ORDER — ORAL CARE MOUTH RINSE
15.0000 mL | Freq: Two times a day (BID) | OROMUCOSAL | Status: DC
  Administered 2019-01-10 – 2019-01-14 (×8): 15 mL via OROMUCOSAL

## 2019-01-10 MED ORDER — SODIUM CHLORIDE 0.9 % IV SOLN
INTRAVENOUS | Status: DC
  Administered 2019-01-10 – 2019-01-13 (×5): via INTRAVENOUS

## 2019-01-10 MED ORDER — IPRATROPIUM BROMIDE 0.02 % IN SOLN
0.5000 mg | Freq: Four times a day (QID) | RESPIRATORY_TRACT | Status: DC
  Administered 2019-01-10 – 2019-01-11 (×3): 0.5 mg via RESPIRATORY_TRACT
  Filled 2019-01-10 (×2): qty 2.5

## 2019-01-10 MED ORDER — SODIUM CHLORIDE 0.9 % IV SOLN
INTRAVENOUS | Status: DC | PRN
  Administered 2019-01-10: 500 mL via INTRAVENOUS

## 2019-01-10 MED ORDER — OSELTAMIVIR PHOSPHATE 75 MG PO CAPS
75.0000 mg | ORAL_CAPSULE | Freq: Two times a day (BID) | ORAL | Status: DC
  Filled 2019-01-10: qty 1

## 2019-01-10 MED ORDER — POLYVINYL ALCOHOL 1.4 % OP SOLN: 1.0000 [drp] | OPHTHALMIC | Status: DC | PRN

## 2019-01-10 MED ORDER — METOPROLOL TARTRATE 5 MG/5ML IV SOLN
5.0000 mg | Freq: Once | INTRAVENOUS | Status: AC
  Administered 2019-01-10: 5 mg via INTRAVENOUS
  Filled 2019-01-10: qty 5

## 2019-01-10 MED ORDER — SODIUM CHLORIDE 0.9 % IV SOLN: INTRAVENOUS | Status: DC

## 2019-01-10 MED ORDER — ROPINIROLE HCL 1 MG PO TABS
5.0000 mg | ORAL_TABLET | Freq: Every day | ORAL | Status: DC
  Administered 2019-01-10 – 2019-01-13 (×4): 5 mg via ORAL
  Filled 2019-01-10 (×4): qty 5

## 2019-01-10 MED ORDER — LEVALBUTEROL HCL 0.63 MG/3ML IN NEBU
0.6300 mg | INHALATION_SOLUTION | Freq: Four times a day (QID) | RESPIRATORY_TRACT | Status: DC
  Administered 2019-01-10 – 2019-01-11 (×2): 0.63 mg via RESPIRATORY_TRACT
  Filled 2019-01-10 (×3): qty 3

## 2019-01-10 MED ORDER — ALBUTEROL SULFATE (2.5 MG/3ML) 0.083% IN NEBU
5.0000 mg | INHALATION_SOLUTION | Freq: Once | RESPIRATORY_TRACT | Status: AC
  Administered 2019-01-10: 5 mg via RESPIRATORY_TRACT
  Filled 2019-01-10: qty 6

## 2019-01-10 MED ORDER — IPRATROPIUM-ALBUTEROL 0.5-2.5 (3) MG/3ML IN SOLN: 3.0000 mL | Freq: Four times a day (QID) | RESPIRATORY_TRACT | Status: DC

## 2019-01-10 NOTE — ED Notes (Signed)
ED TO INPATIENT HANDOFF REPORT  Name/Age/Gender [name] 85 y.o. female  Code Status    Code Status Orders  (From admission, onward)         Start     Ordered   01/10/19 1329  Do not attempt resuscitation/DNR  Continuous    Question Answer Comment  In the event of cardiac or respiratory ARREST Do not call a "code blue"   In the event of cardiac or respiratory ARREST Do not perform Intubation, CPR, defibrillation or ACLS   In the event of cardiac or respiratory ARREST Use medication by any route, position, wound care, and other measures to relive pain and suffering. May use oxygen, suction and manual treatment of airway obstruction as needed for comfort.      01/10/19 1329        Code Status History    Date Active Date Inactive Code Status Order ID Comments User Context   12/24/2017 2219 12/25/2017 1627 Full Code [id number]  Dalia Heading, PA-C ED   11/22/2017 0523 11/24/2017 1745 DNR [id number]  Ivor Costa, MD ED   12/04/2015 2239 12/08/2015 1614 DNR [id number]  Rise Patience, MD Inpatient   12/04/2015 2205 12/04/2015 2239 DNR [id number]  Rise Patience, MD Inpatient   06/12/2015 1712 06/14/2015 1823 DNR [id number]  Modena Jansky, MD Inpatient   02/28/2015 2015 03/03/2015 1756 Full Code [id number]  Orson Eva, MD Inpatient   10/02/2014 0013 10/09/2014 1921 Full Code [id number]  Tresa Garter, MD Inpatient   04/03/2014 2051 04/06/2014 1823 Full Code [id number]  Robbie Lis, MD Inpatient   02/17/2013 1547 02/22/2013 1910 DNR [id number]  Eugenie Filler, MD Inpatient   12/05/2012 1549 12/09/2012 1543 DNR [id number]  Earnstine Regal, [place] ED   12/05/2012 1540 12/05/2012 1549 DNR [id number]  Earnstine Regal, [place] [facility]   08/04/2012 1350 08/05/2012 1714 Full Code [id number]  Silk, Steva Colder, RN Inpatient    Advance Directive Documentation     Most Recent Value  Type of Advance Directive  Healthcare Power of Attorney, Living will, Out of facility DNR (pink MOST or yellow  form)  Pre-existing out of facility DNR order (yellow form or pink MOST form)  -  "MOST" Form in Place?  -      Home/SNF/Other Home  Chief Complaint Shortness of Breath   Level of Care/Admitting Diagnosis ED Disposition    ED Disposition Condition [place]: [facility] [100102]  Level of Care: Telemetry [5]  Admit to tele based on following criteria: Complex arrhythmia (Bradycardia/Tachycardia)  Diagnosis: Pneumonia [003491]  Admitting Physician: Elmarie Shiley [phone]  Attending Physician: Niel Hummer A [[id number]]  PT Class (Do Not Modify): Observation [104]  PT Acc Code (Do Not Modify): Observation [10022]       Medical History Past Medical History:  Diagnosis Date  . Acute gastritis without mention of hemorrhage   . Anemia   . Anxiety   . Arthritis    "LLL; palm of right hand" (06/12/2015)  . Atrial fibrillation ([place])   . CAP (community acquired pneumonia) 04/03/2014  . Crohn disease ([place])   . Cyst and pseudocyst of pancreas   . Depression   . Diaphragmatic hernia without mention of obstruction or gangrene   . Diverticulosis of colon (without mention of hemorrhage)   . Duodenitis without mention of hemorrhage   . DVT (deep vein thrombosis) in pregnancy 11/22/2017  . DVT (deep venous thrombosis) (HCC)    "twice  on the left leg; once on the right leg" (06/12/2015)  . Dysrhythmia   . Esophageal reflux   . Hiatal hernia- moderate to large 10/06/2014  . Hx pulmonary embolism   . Hypothyroidism   . Insomnia, unspecified   . Left leg DVT ([place]) jan 2013  . Migraine    hx  . OAB (overactive bladder)   . Other and unspecified hyperlipidemia   . Other malaise and fatigue   . Pancreas divisum 03/02/2015  . Pancreatic cyst   . Personal history of unspecified digestive disease   . Polymyalgia rheumatica ([place])   . Protein-calorie malnutrition, severe ([place]) 10/02/2014  . Restless legs syndrome (RLS)   . Shingles   . Stricture  and stenosis of esophagus   . Type II diabetes mellitus ([place])   . Unspecified essential hypertension   . Unspecified sleep apnea    hx of sleep apnea, none since weight loss (06/12/2015)  . Urinary frequency   . Urinary urgency     Allergies Allergies  Allergen Reactions  . Sulfa Antibiotics Nausea And Vomiting  . Penicillins Hives and Rash    ++tolerates Ceftriaxone and keflex++ Has patient had a PCN reaction causing immediate rash, facial/tongue/throat swelling, SOB or lightheadedness with hypotension: YES Has patient had a PCN reaction causing severe rash involving mucus membranes or skin necrosis: Unknown Has patient had a PCN reaction that required hospitalization Unknown Has patient had a PCN reaction occurring within the last 10 years: No If all of the above answers are "NO", then may proceed with Cephalosporin use.     IV Location/Drains/Wounds Patient Lines/Drains/Airways Status   Active Line/Drains/Airways    Name:   Placement date:   Placement time:   Site:   Days:   Peripheral IV 01/10/19 Right Hand   01/10/19    1300    Hand   less than 1   Peripheral IV 01/10/19 Right;Anterior Forearm   01/10/19    1432    Forearm   less than 1   External Urinary Catheter   12/25/17    0404    -   381          Labs/Imaging Results for orders placed or performed during the hospital encounter of 01/10/19 (from the past 48 hour(s))  CBC WITH DIFFERENTIAL     Status: Abnormal   Collection Time: 01/10/19  1:29 PM  Result Value Ref Range   WBC 8.7 4.0 - 10.5 K/uL   RBC 3.99 3.87 - 5.11 MIL/uL   Hemoglobin 11.9 (L) 12.0 - 15.0 g/dL   HCT 39.3 36.0 - 46.0 %   MCV 98.5 80.0 - 100.0 fL   MCH 29.8 26.0 - 34.0 pg   MCHC 30.3 30.0 - 36.0 g/dL   RDW 13.2 11.5 - 15.5 %   Platelets 199 150 - 400 K/uL   nRBC 0.0 0.0 - 0.2 %   Neutrophils Relative % 67 %   Neutro Abs 6.0 1.7 - 7.7 K/uL   Lymphocytes Relative 25 %   Lymphs Abs 2.2 0.7 - 4.0 K/uL   Monocytes Relative 5 %   Monocytes  Absolute 0.4 0.1 - 1.0 K/uL   Eosinophils Relative 1 %   Eosinophils Absolute 0.0 0.0 - 0.5 K/uL   Basophils Relative 1 %   Basophils Absolute 0.0 0.0 - 0.1 K/uL   Immature Granulocytes 1 %   Abs Immature Granulocytes 0.04 0.00 - 0.07 K/uL    Comment: Performed at [facility], [place]  [address] [place], [place] [address]  Brain natriuretic peptide     Status: Abnormal   Collection Time: 01/10/19  1:29 PM  Result Value Ref Range   B Natriuretic Peptide 152.3 (H) 0.0 - 100.0 pg/mL    Comment: Performed at [facility], [place] [address] [place], [place] [address]  Lactic acid, plasma     Status: Abnormal   Collection Time: 01/10/19  1:29 PM  Result Value Ref Range   Lactic Acid, Venous 2.5 (HH) 0.5 - 1.9 mmol/L    Comment: CRITICAL RESULT CALLED TO, READ BACK BY AND VERIFIED WITH: Jamal Maes [id number] @ 0630 BY Martina Sinner Performed at [facility] [address], [place], [place] [address]   Comprehensive metabolic panel     Status: Abnormal   Collection Time: 01/10/19  2:41 PM  Result Value Ref Range   Sodium 137 135 - 145 mmol/L   Potassium 3.2 (L) 3.5 - 5.1 mmol/L   Chloride 103 98 - 111 mmol/L   CO2 23 22 - 32 mmol/L   Glucose, Bld 233 (H) 70 - 99 mg/dL   BUN 22 8 - 23 mg/dL   Creatinine, Ser 1.21 (H) 0.44 - 1.00 mg/dL   Calcium 8.6 (L) 8.9 - 10.3 mg/dL   Total Protein 7.0 6.5 - 8.1 g/dL   Albumin 3.4 (L) 3.5 - 5.0 g/dL   AST 24 15 - 41 U/L   ALT 12 0 - 44 U/L   Alkaline Phosphatase 136 (H) 38 - 126 U/L   Total Bilirubin 0.3 0.3 - 1.2 mg/dL   GFR calc non Af Amer 37 (L) >60 mL/min   GFR calc Af Amer 43 (L) >60 mL/min   Anion gap 11 5 - 15    Comment: Performed at [facility], [place] [address], [place], [place] [address]   Dg Chest Port 1 View  Result Date: 01/10/2019 CLINICAL DATA:  Respiratory distress. EXAM: PORTABLE CHEST 1 VIEW COMPARISON:  Chest x-ray dated November 01, 2017. FINDINGS: The  heart size and mediastinal contours are within normal limits. Normal pulmonary vascularity. Atherosclerotic calcification of the aortic arch. Unchanged severe calcifications of the mitral annulus. Increased density in the right mid lung. No pleural effusion or pneumothorax. No acute osseous abnormality. IMPRESSION: 1. Increased opacity in right mid lung, suspicious for pneumonia. Followup PA and lateral chest X-ray is recommended in 3-4 weeks following trial of antibiotic therapy to ensure resolution. Electronically Signed   By: Titus Dubin M.D.   On: 01/10/2019 13:57    Pending Labs Unresulted Labs (From admission, onward)    Start     Ordered   01/10/19 1611  Influenza panel by PCR (type A & B)  (Influenza PCR Panel)  Once,   R     01/10/19 1611   01/10/19 1329  Lactic acid, plasma  Now then every 2 hours,   STAT     01/10/19 1328   Signed and Held  Basic metabolic panel  Tomorrow morning,   R     Signed and Held   Signed and Held  CBC  Tomorrow morning,   R     Signed and Held          Vitals/Pain Today's Vitals   01/10/19 1543 01/10/19 1554 01/10/19 1613 01/10/19 1630  BP:  (!) 130/57 133/71 123/65  Pulse:  (!) 145 92 (!) 118  Resp: (!) 30 (!) 25 (!) 25 (!) 34  Temp:  TempSrc:      SpO2:  100% 100% 97%  PainSc:        Isolation Precautions Droplet precaution  Medications Medications  0.9 %  sodium chloride infusion (500 mLs Intravenous New Bag/Given 01/10/19 1455)  0.9 %  sodium chloride infusion (has no administration in time range)  sodium chloride 0.9 % bolus 500 mL (500 mLs Intravenous New Bag/Given (Non-Interop) 01/10/19 1650)  magnesium sulfate IVPB 2 g 50 mL (has no administration in time range)  guaiFENesin-dextromethorphan (ROBITUSSIN DM) 100-10 MG/5ML syrup 5 mL (has no administration in time range)  levalbuterol (XOPENEX) nebulizer solution 0.63 mg (has no administration in time range)  ipratropium (ATROVENT) nebulizer solution 0.5 mg (0.5 mg Nebulization  Given 01/10/19 1642)  cefTRIAXone (ROCEPHIN) 1 g in sodium chloride 0.9 % 100 mL IVPB (1 g Intravenous New Bag/Given 01/10/19 1651)  azithromycin (ZITHROMAX) 500 mg in sodium chloride 0.9 % 250 mL IVPB (has no administration in time range)  oseltamivir (TAMIFLU) capsule 75 mg (has no administration in time range)  metoprolol tartrate (LOPRESSOR) injection 2.5 mg (has no administration in time range)  morphine 2 MG/ML injection 0.5 mg (has no administration in time range)  albuterol (PROVENTIL) (2.5 MG/3ML) 0.083% nebulizer solution 5 mg (5 mg Nebulization Given 01/10/19 1337)  levofloxacin (LEVAQUIN) IVPB 500 mg (500 mg Intravenous New Bag/Given 01/10/19 1457)  acetaminophen (TYLENOL) tablet 1,000 mg (1,000 mg Oral Given 01/10/19 1604)  potassium chloride 20 MEQ/15ML (10%) solution 40 mEq (40 mEq Oral Given 01/10/19 1618)  metoprolol tartrate (LOPRESSOR) injection 5 mg (5 mg Intravenous Given 01/10/19 1644)    Mobility walks

## 2019-01-10 NOTE — ED Notes (Signed)
Date and time results received: 01/10/19 2:48 PM    Test: lactic acid Critical Value: 2.5  Name of Provider Notified: LOCKWOOD MD  Orders Received? Or Actions Taken?: acknowledges result

## 2019-01-10 NOTE — H&P (Signed)
History and Physical  [name] CXK:[id number] DOB: 06-Oct-1919 DOA: 01/10/2019  PCP: Briscoe Deutscher, DO Patient coming from: Home  I have personally briefly reviewed patient's old medical records in [place]   Chief Complaint: SOB of breath, cough.  HPI: [name] is a 85 y.o. female with past medical history significant for A. fib, Crohn's disease, diabetes, on hospice care who presents to the emergency department complaining of shortness of breath, cough that is started 2 days prior to admission.  She reports productive cough.  She also reports generalized weakness.  She has not been able to ambulate.  At baseline she is able to ambulate with a walker.  She also reports an episode of chest pain the night prior to admission, after a coughing spell.  Patient reports fever and chills.  Evaluation in the ED: Sodium 137, potassium 3.2, glucose 233, creatinine 1.2, calcium 8.6, alkaline phosphatase 136, BNP 152, lactic acid 2.5, white blood cell 8.7, hemoglobin 11.9.  Influenza negative, chest x-ray: Increased opacity in right mid lung, suspicious for pneumonia.  Review of Systems: All systems reviewed and apart from history of presenting illness, are negative.  Past Medical History:  Diagnosis Date  . Acute gastritis without mention of hemorrhage   . Anemia   . Anxiety   . Arthritis    "LLL; palm of right hand" (06/12/2015)  . Atrial fibrillation ([place])   . CAP (community acquired pneumonia) 04/03/2014  . Crohn disease ([place])   . Cyst and pseudocyst of pancreas   . Depression   . Diaphragmatic hernia without mention of obstruction or gangrene   . Diverticulosis of colon (without mention of hemorrhage)   . Duodenitis without mention of hemorrhage   . DVT (deep vein thrombosis) in pregnancy 11/22/2017  . DVT (deep venous thrombosis) ([place])    "twice on the left leg; once on the right leg" (06/12/2015)  . Dysrhythmia   . Esophageal reflux   . Hiatal hernia- moderate to  large 10/06/2014  . Hx pulmonary embolism   . Hypothyroidism   . Insomnia, unspecified   . Left leg DVT ([place]) jan 2013  . Migraine    hx  . OAB (overactive bladder)   . Other and unspecified hyperlipidemia   . Other malaise and fatigue   . Pancreas divisum 03/02/2015  . Pancreatic cyst   . Personal history of unspecified digestive disease   . Polymyalgia rheumatica ([place])   . Protein-calorie malnutrition, severe ([place]) 10/02/2014  . Restless legs syndrome (RLS)   . Shingles   . Stricture and stenosis of esophagus   . Type II diabetes mellitus ([place])   . Unspecified essential hypertension   . Unspecified sleep apnea    hx of sleep apnea, none since weight loss (06/12/2015)  . Urinary frequency   . Urinary urgency    Past Surgical History:  Procedure Laterality Date  . BACK SURGERY    . CATARACT EXTRACTION, BILATERAL Bilateral   . CLOSED REDUCTION NASAL FRACTURE  08/2008   Archie Endo 04/10/2011  . COLON SURGERY  June 17, 2012   surgery for bleed  . COLONOSCOPY N/A 06/13/2015   Procedure: COLONOSCOPY;  Surgeon: Irene Shipper, MD;  Location: [facility];  Service: Endoscopy;  Laterality: N/A;  . OOPHORECTOMY  1944   not sure which ovary  . POSTERIOR LUMBAR FUSION  01/2009   L4-L5 Archie Endo 04/09/2011  . REDUCTION MAMMAPLASTY Bilateral 1980  . ROTATOR CUFF REPAIR Right   . SHOULDER OPEN  ROTATOR CUFF REPAIR  08/04/2012   Procedure: ROTATOR CUFF REPAIR SHOULDER OPEN;  Surgeon: Tobi Bastos, MD;  Location: [facility];  Service: Orthopedics;  Laterality: Left;  with Anchors and Graft  . SHOULDER OPEN ROTATOR CUFF REPAIR Right 2006   Archie Endo 04/22/2011  . TONSILLECTOMY     Social History:  reports that she has never smoked. She has never used smokeless tobacco. She reports that she does not drink alcohol or use drugs.   Allergies  Allergen Reactions  . Sulfa Antibiotics Nausea And Vomiting  . Penicillins Hives and Rash    ++tolerates Ceftriaxone and keflex++ Has patient had a PCN reaction  causing immediate rash, facial/tongue/throat swelling, SOB or lightheadedness with hypotension: YES Has patient had a PCN reaction causing severe rash involving mucus membranes or skin necrosis: Unknown Has patient had a PCN reaction that required hospitalization Unknown Has patient had a PCN reaction occurring within the last 10 years: No If all of the above answers are "NO", then may proceed with Cephalosporin use.     Family History  Problem Relation Age of Onset  . Melanoma Daughter        died age 59  . Melanoma Son     Prior to Admission medications   Medication Sig Start Date End Date Taking? Authorizing Provider  diclofenac sodium (VOLTAREN) 1 % GEL Apply 4 g topically 4 (four) times daily as needed. 01/09/19  Yes Vivi Barrack, MD  doxycycline (VIBRA-TABS) 100 MG tablet Take 1 tablet (100 mg total) by mouth 2 (two) times daily. 01/09/19  Yes Vivi Barrack, MD  pantoprazole (PROTONIX) 40 MG tablet Take 1 tablet (40 mg total) by mouth daily. 08/12/18  Yes Briscoe Deutscher, DO  ropinirole (REQUIP) 5 MG tablet TAKE ONE TABLET BY MOUTH EVERY NIGHT AT BEDTIME 12/13/18  Yes Briscoe Deutscher, DO  triamcinolone ointment (KENALOG) 0.5 % Apply 1 application topically 2 (two) times daily as needed (irritation,rash). 12/11/17  Yes Briscoe Deutscher, DO  lidocaine (LIDODERM) 5 % PLACE 1 PATCH ONTO AFFECTED AREA(S) EVERY 12 HOURS FOR PAIN RELIEF 12/07/18   Briscoe Deutscher, DO  mirtazapine (REMERON) 15 MG tablet Take half tablet at bedtime. Patient not taking: Reported on 01/10/2019 04/23/18   Briscoe Deutscher, DO  polyvinyl alcohol (LIQUIFILM TEARS) 1.4 % ophthalmic solution Place 1 drop into both eyes as needed for dry eyes. 11/24/17   Florencia Reasons, MD   Physical Exam: Vitals:   01/10/19 1500 01/10/19 1527 01/10/19 1543 01/10/19 1554  BP: (!) 138/58 138/70  (!) 130/57  Pulse: (!) 125 (!) 138  (!) 145  Resp: (!) 26 (!) 25 (!) 30 (!) 25  Temp:      TempSrc:      SpO2: 95% 93%  100%     General exam: Thin  appearing, moderate distress.   Head, eyes and ENT: Nontraumatic and normocephalic. Pupils equally reacting to light and accommodation. Oral mucosa moist.  Neck: Supple. No JVD, carotid bruit or thyromegaly.  Lymphatics: No lymphadenopathy.  Respiratory system: Bilateral rhonchorous, using some accessory muscles to breath.  Cardiovascular system: S1 and S2 heard, tachycardic. No JVD, murmurs, gallops, clicks or pedal edema.  Gastrointestinal system: Abdomen is nondistended, soft and nontender. Normal bowel sounds heard. No organomegaly or masses appreciated.  Central nervous system: Alert and oriented.   Extremities: . Peripheral pulses symmetrically felt.   Skin: No rashes or acute findings.  Musculoskeletal system: Negative exam.  Psychiatry: Pleasant and cooperative.   Labs on Admission:  Basic  Metabolic Panel: Recent Labs  Lab 01/10/19 1441  NA 137  K 3.2*  CL 103  CO2 23  GLUCOSE 233*  BUN 22  CREATININE 1.21*  CALCIUM 8.6*   Liver Function Tests: Recent Labs  Lab 01/10/19 1441  AST 24  ALT 12  ALKPHOS 136*  BILITOT 0.3  PROT 7.0  ALBUMIN 3.4*   No results for input(s): LIPASE, AMYLASE in the last 168 hours. No results for input(s): AMMONIA in the last 168 hours. CBC: Recent Labs  Lab 01/10/19 1329  WBC 8.7  NEUTROABS 6.0  HGB 11.9*  HCT 39.3  MCV 98.5  PLT 199   Cardiac Enzymes: No results for input(s): CKTOTAL, CKMB, CKMBINDEX, TROPONINI in the last 168 hours.  BNP (last 3 results) No results for input(s): PROBNP in the last 8760 hours. CBG: No results for input(s): GLUCAP in the last 168 hours.  Radiological Exams on Admission: Dg Chest Port 1 View  Result Date: 01/10/2019 CLINICAL DATA:  Respiratory distress. EXAM: PORTABLE CHEST 1 VIEW COMPARISON:  Chest x-ray dated November 01, 2017. FINDINGS: The heart size and mediastinal contours are within normal limits. Normal pulmonary vascularity. Atherosclerotic calcification of the aortic  arch. Unchanged severe calcifications of the mitral annulus. Increased density in the right mid lung. No pleural effusion or pneumothorax. No acute osseous abnormality. IMPRESSION: 1. Increased opacity in right mid lung, suspicious for pneumonia. Followup PA and lateral chest X-ray is recommended in 3-4 weeks following trial of antibiotic therapy to ensure resolution. Electronically Signed   By: Titus Dubin M.D.   On: 01/10/2019 13:57    EKG: Independently reviewed.  Sinus tachycardia  Assessment/Plan Principal Problem:   Community acquired pneumonia Active Problems:   Benign essential hypertension   GERD (gastroesophageal reflux disease)   Anemia of chronic disease   Type 2 diabetes mellitus with stage 3 chronic kidney disease (HCC)   CKD stage 4 due to type 2 diabetes mellitus ([place])   DNR (do not resuscitate)   Dementia without behavioral disturbance ([place])   1-Community acquired pneumonia:\ -Patient presents with cough, fever, chest x-ray with pulmonary infiltrates, tachypnea,  tachycardic. -Will start ceftriaxone and azithromycin.  Would like to avoid Levaquin in a 85 year old to avoid delirium.  -Nebulizer treatment, Tussionex for cough. -Oxygen supplementation. -Influenza panel negative.  2-Sinus tachycardia;  Will treat fever, IV fluids, PRN metoprolol.  3-Hypokalemia;  Replete orally.  IV magnesium.  4-DM; SSI.  Not on medications at home.     DVT Prophylaxis: Lovenox Code Status: DNR Family Communication: No family at bedside Disposition Plan: Admit under observation for treatment of pneumonia.  Time spent: 75 minutes.      Elmarie Shiley MD [facility]   01/10/2019, 4:03 PM

## 2019-01-10 NOTE — Telephone Encounter (Signed)
See note

## 2019-01-10 NOTE — ED Notes (Signed)
Bed: CC88 Expected date:  Expected time:  Means of arrival:  Comments: EMS wheezing

## 2019-01-10 NOTE — Telephone Encounter (Signed)
FYI

## 2019-01-10 NOTE — Telephone Encounter (Signed)
Copied from [place] [phone]. Topic: Quick Communication - See Telephone Encounter >> Jan 10, 2019 12:47 PM Blase Mess A wrote: CRM for notification. See Telephone encounter for: 01/10/19.  Rickey Barbara with [facility] is calling to notify Dr. Juleen China that the patient was sent to [facility] ED because patient was in respiratory Distress.  Patient was started on doxycycline (VIBRA-TABS) 100 MG tablet [[id number]] yesterday and was having a hard time breathing today. Rickey Barbara [phone]

## 2019-01-10 NOTE — ED Provider Notes (Signed)
[facility] DEPT Provider Note   CSN: [id number] Arrival date & time: 01/10/19  1236     History   Chief Complaint No chief complaint on file.   HPI KASSITY WOODSON is a 85 y.o. female.  HPI Patient with multiple medical problems, on hospice care due to history of failure to thrive presents with concern for respiratory difficulty.  The patient herself is tachypneic, tachycardic, offers only minimal verbal responses to initial questions, level 5 caveat secondary to acuity of condition. After the initial evaluation I discussed patient's case with her hospice nurse. She notes that she has known the patient for over 1 year, and the patient has not had a similar respiratory episodes. Over the past month the patient has reportedly become more weak than usual, but only over the past day or so has she had developed new respiratory difficulty. Is unclear if she has a fever at home, but the patient does live alone, typically ambulates with a walker, typically has clear mentation, is awake, alert, verbal. Past Medical History:  Diagnosis Date  . Acute gastritis without mention of hemorrhage   . Anemia   . Anxiety   . Arthritis    "LLL; palm of right hand" (06/12/2015)  . Atrial fibrillation ([place])   . CAP (community acquired pneumonia) 04/03/2014  . Crohn disease ([place])   . Cyst and pseudocyst of pancreas   . Depression   . Diaphragmatic hernia without mention of obstruction or gangrene   . Diverticulosis of colon (without mention of hemorrhage)   . Duodenitis without mention of hemorrhage   . DVT (deep vein thrombosis) in pregnancy 11/22/2017  . DVT (deep venous thrombosis) ([place])    "twice on the left leg; once on the right leg" (06/12/2015)  . Dysrhythmia   . Esophageal reflux   . Hiatal hernia- moderate to large 10/06/2014  . Hx pulmonary embolism   . Hypothyroidism   . Insomnia, unspecified   . Left leg DVT ([place]) jan 2013  . Migraine    hx  . OAB  (overactive bladder)   . Other and unspecified hyperlipidemia   . Other malaise and fatigue   . Pancreas divisum 03/02/2015  . Pancreatic cyst   . Personal history of unspecified digestive disease   . Polymyalgia rheumatica ([place])   . Protein-calorie malnutrition, severe ([place]) 10/02/2014  . Restless legs syndrome (RLS)   . Shingles   . Stricture and stenosis of esophagus   . Type II diabetes mellitus ([place])   . Unspecified essential hypertension   . Unspecified sleep apnea    hx of sleep apnea, none since weight loss (06/12/2015)  . Urinary frequency   . Urinary urgency     Patient Active Problem List   Diagnosis Date Noted  . Dementia without behavioral disturbance ([place]) 12/27/2018  . Anorexia 12/27/2018  . Recurrent deep vein thrombosis (DVT) ([place]) 12/27/2018  . Peripheral edema 12/27/2018  . Hospice care patient 10/24/2018  . Auditory hallucinations 12/25/2017  . Altered mental status   . DNR (do not resuscitate) 12/12/2017  . CKD (chronic kidney disease), stage III ([place]) 11/22/2017  . Hypertension 11/22/2017  . Atrial fibrillation, chronic 11/22/2017  . Depression 11/22/2017  . Protein-calorie malnutrition, moderate ([place]) 11/22/2017  . Dry skin dermatitis 09/24/2017  . Osteoarthritis 07/31/2017  . CKD stage 4 due to type 2 diabetes mellitus ([place]) 04/19/2017  . Iron deficiency anemia 04/19/2017  . B12 deficiency 04/08/2017  . Chronic constipation 04/08/2017  . Chronic deep  vein thrombosis (DVT) of left lower extremity ([place]) 04/08/2017  . Monoclonal gammopathy of unknown significance (MGUS) 04/08/2017  . Peripheral neuropathy 04/08/2017  . Rectal mass   . Pancreas divisum 03/02/2015  . Physical deconditioning 03/01/2015  . Pancreatic cyst   . Hiatal hernia- moderate to large 10/06/2014  . Heme + stool 10/04/2014  . Crohn's ileocolitis ([place]) 10/04/2014  . 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 Arthritis   . Hx pulmonary embolism   . Hypothyroidism 04/11/2014  . Type 2 diabetes  mellitus with stage 3 chronic kidney disease ([place]) 04/11/2014  . Anemia of chronic disease 04/03/2014  . OAB (overactive bladder) 01/07/2012  . Restless leg syndrome 03/09/2008  . Benign essential hypertension 03/09/2008  . ESOPHAGEAL STRICTURE 03/09/2008  . GERD (gastroesophageal reflux disease) 03/09/2008  . Diverticulosis of large intestine 03/09/2008  . Polymyalgia rheumatica ([place]) 03/09/2008    Past Surgical History:  Procedure Laterality Date  . BACK SURGERY    . CATARACT EXTRACTION, BILATERAL Bilateral   . CLOSED REDUCTION NASAL FRACTURE  08/2008   Archie Endo 04/10/2011  . COLON SURGERY  June 17, 2012   surgery for bleed  . COLONOSCOPY N/A 06/13/2015   Procedure: COLONOSCOPY;  Surgeon: Irene Shipper, MD;  Location: [facility];  Service: Endoscopy;  Laterality: N/A;  . OOPHORECTOMY  1944   not sure which ovary  . POSTERIOR LUMBAR FUSION  01/2009   L4-L5 Archie Endo 04/09/2011  . REDUCTION MAMMAPLASTY Bilateral 1980  . ROTATOR CUFF REPAIR Right   . SHOULDER OPEN ROTATOR CUFF REPAIR  08/04/2012   Procedure: ROTATOR CUFF REPAIR SHOULDER OPEN;  Surgeon: Tobi Bastos, MD;  Location: [facility];  Service: Orthopedics;  Laterality: Left;  with Anchors and Graft  . SHOULDER OPEN ROTATOR CUFF REPAIR Right 2006   Archie Endo 04/22/2011  . TONSILLECTOMY       OB History   No obstetric history on file.      Home Medications    Prior to Admission medications   Medication Sig Start Date End Date Taking? Authorizing Provider  diclofenac sodium (VOLTAREN) 1 % GEL Apply 4 g topically 4 (four) times daily as needed. 01/09/19  Yes Vivi Barrack, MD  doxycycline (VIBRA-TABS) 100 MG tablet Take 1 tablet (100 mg total) by mouth 2 (two) times daily. 01/09/19  Yes Vivi Barrack, MD  pantoprazole (PROTONIX) 40 MG tablet Take 1 tablet (40 mg total) by mouth daily. 08/12/18  Yes Briscoe Deutscher, DO  ropinirole (REQUIP) 5 MG tablet TAKE ONE TABLET BY MOUTH EVERY NIGHT AT BEDTIME 12/13/18  Yes Briscoe Deutscher, DO    lidocaine (LIDODERM) 5 % PLACE 1 PATCH ONTO AFFECTED AREA(S) EVERY 12 HOURS FOR PAIN RELIEF 12/07/18   Briscoe Deutscher, DO  mirtazapine (REMERON) 15 MG tablet Take half tablet at bedtime. 04/23/18   Briscoe Deutscher, DO  polyvinyl alcohol (LIQUIFILM TEARS) 1.4 % ophthalmic solution Place 1 drop into both eyes as needed for dry eyes. 11/24/17   Florencia Reasons, MD  triamcinolone ointment (KENALOG) 0.5 % Apply 1 application topically 2 (two) times daily as needed (irritation,rash). 12/11/17   Briscoe Deutscher, DO    Family History Family History  Problem Relation Age of Onset  . Melanoma Daughter        died age 34  . Melanoma Son     Social History Social History   Tobacco Use  . Smoking status: Never Smoker  . Smokeless tobacco: Never Used  Substance Use Topics  . Alcohol use: No  . Drug use:  No     Allergies   Sulfa antibiotics and Penicillins   Review of Systems Review of Systems  Unable to perform ROS: Acuity of condition     Physical Exam Updated Vital Signs BP 125/89   Pulse (!) 120   Temp (!) 101.1 F (38.4 C) (Rectal)   Resp (!) 26   SpO2 95%   Physical Exam Vitals signs and nursing note reviewed.  Constitutional:      General: She is in acute distress.     Appearance: She is ill-appearing and diaphoretic.     Comments: Elderly female in respiratory distress.  HENT:     Head: Normocephalic and atraumatic.  Eyes:     Conjunctiva/sclera: Conjunctivae normal.  Cardiovascular:     Rate and Rhythm: Normal rate and regular rhythm.  Pulmonary:     Effort: Tachypnea, accessory muscle usage and respiratory distress present.     Breath sounds: No stridor. Decreased breath sounds and wheezing present.  Abdominal:     General: There is no distension.  Skin:    General: Skin is warm.  Neurological:     Cranial Nerves: No cranial nerve deficit.     Motor: Atrophy present.     Comments: Patient listless, but with stimuli answers questions briefly, with minimal phonation   Psychiatric:     Comments: Difficult to fully ascertain as the patient is in respiratory distress      ED Treatments / Results  Labs (all labs ordered are listed, but only abnormal results are displayed) Labs Reviewed  CBC WITH DIFFERENTIAL/PLATELET - Abnormal; Notable for the following components:      Result Value   Hemoglobin 11.9 (*)    All other components within normal limits  BRAIN NATRIURETIC PEPTIDE  LACTIC ACID, PLASMA  LACTIC ACID, PLASMA  COMPREHENSIVE METABOLIC PANEL    EKG EKG Interpretation  Date/Time:  Monday January 10 2019 13:11:27 EST Ventricular Rate:  128 PR Interval:    QRS Duration: 125 QT Interval:  344 QTC Calculation: 502 R Axis:   -87 Text Interpretation:  Sinus tachycardia Premature atrial complexes Non-specific intra-ventricular conduction delay ST-t wave abnormality Artifact Baseline wander Abnormal ekg Confirmed by Carmin Muskrat [phone]) on 01/10/2019 2:22:46 PM   Radiology Dg Chest Port 1 View  Result Date: 01/10/2019 CLINICAL DATA:  Respiratory distress. EXAM: PORTABLE CHEST 1 VIEW COMPARISON:  Chest x-ray dated November 01, 2017. FINDINGS: The heart size and mediastinal contours are within normal limits. Normal pulmonary vascularity. Atherosclerotic calcification of the aortic arch. Unchanged severe calcifications of the mitral annulus. Increased density in the right mid lung. No pleural effusion or pneumothorax. No acute osseous abnormality. IMPRESSION: 1. Increased opacity in right mid lung, suspicious for pneumonia. Followup PA and lateral chest X-ray is recommended in 3-4 weeks following trial of antibiotic therapy to ensure resolution. Electronically Signed   By: Titus Dubin M.D.   On: 01/10/2019 13:57    Procedures Procedures (including critical care time)  Medications Ordered in ED Medications  albuterol (PROVENTIL) (2.5 MG/3ML) 0.083% nebulizer solution 5 mg (5 mg Nebulization Given 01/10/19 1337)     Initial Impression /  Assessment and Plan / ED Course  I have reviewed the triage vital signs and the nursing notes.  Pertinent labs & imaging results that were available during my care of the patient were reviewed by me and considered in my medical decision making (see chart for details).    After the initial evaluation and after discussed the patient's case with her  hospice team, we confirmed the patient does want to be evaluated for this illness, though she has notes that previously state the patient does not want hospitalization or ED evaluation. Given the progression of the patient's symptoms, not her baseline condition for hospice care, this is reasonable.  Update: Initial findings concerning for pneumonia, with opacification in the right midlung, this is consistent with the patient's acute respiratory distress. She is receiving IV antibiotics.   This elderly female actually on hospice care, for failure to thrive presents with new respiratory difficulty. Patient is oriented appropriately, improves in the emergency department, and given concern for new pneumonia, new oxygen requirement, patient was admitted for further evaluation, monitoring, management. Patient is a DNR, but this is an illness new for her, not her reason for hospice care.  Final Clinical Impressions(s) / ED Diagnoses  Respiratory distress Community-acquired pneumonia  CRITICAL CARE Performed by: Carmin Muskrat Total critical care time: 35 minutes Critical care time was exclusive of separately billable procedures and treating other patients. Critical care was necessary to treat or prevent imminent or life-threatening deterioration. Critical care was time spent personally by me on the following activities: development of treatment plan with patient and/or surrogate as well as nursing, discussions with consultants, evaluation of patient's response to treatment, examination of patient, obtaining history from patient or surrogate, ordering and  performing treatments and interventions, ordering and review of laboratory studies, ordering and review of radiographic studies, pulse oximetry and re-evaluation of patient's condition.    Carmin Muskrat, MD 01/10/19 1620

## 2019-01-10 NOTE — ED Notes (Addendum)
Attempt IV to left hand and left AC unsuccessful; was able to get one blood culture from left hand. Successful right hand IV without blood return; flushes well.   Vanita Panda MD at bedside.

## 2019-01-10 NOTE — ED Triage Notes (Signed)
Pt BIB EMS from home and is hospice patient. Pt came in today with [facility], left-sided wheezing and cough. Pt is A&O x4. Pt has hx of failure to thrive. Pt is on doxycycline right now but hospice nurse unable to tell why.  Albuterol 10 mg Atrovent 0.5mg  170/100 to 159/90 84% RA; placed on 2L 98% CBG 130

## 2019-01-10 NOTE — Plan of Care (Signed)
  Problem: Education: Goal: Knowledge of General Education information will improve Description Including pain rating scale, medication(s)/side effects and non-pharmacologic comfort measures Outcome: Progressing   

## 2019-01-10 NOTE — Progress Notes (Signed)
[facility] Liaison note.   Notified by [facility] RN that patient was being transported to ED for evaluation and treatment of shortness of breath.   Visited patient at bedside, caregiver friend, Timmothy Sours was present. Patient currently lives alone and has not family. She has friends that are her PCG and support system.  Patient was alert and oriented. She was dyspneic, wheezing, tachycardic and has a cough.   Spoke with Dr. Vanita Panda, who states he plans to admit for a day or so to see if she can improve with antibiotics and fluids.   Epic imaging: IMPRESSION: 1. Increased opacity in right mid lung, suspicious for pneumonia. Followup PA and lateral chest X-ray is recommended in 3-4 weeks following trial of antibiotic therapy to ensure resolution.  [facility] RN will follow up on 01/11/2019 to complete GIP admission visit.  Please call with any hospice related questions or concerns.  Thank you, Farrel Gordon, RN, [facility] Liaison (listed on [place]) [phone]

## 2019-01-11 ENCOUNTER — Telehealth: Payer: Self-pay

## 2019-01-11 DIAGNOSIS — G2581 Restless legs syndrome: Secondary | ICD-10-CM | POA: Diagnosis present

## 2019-01-11 DIAGNOSIS — J9601 Acute respiratory failure with hypoxia: Secondary | ICD-10-CM | POA: Diagnosis present

## 2019-01-11 DIAGNOSIS — J181 Lobar pneumonia, unspecified organism: Secondary | ICD-10-CM | POA: Diagnosis not present

## 2019-01-11 DIAGNOSIS — R627 Adult failure to thrive: Secondary | ICD-10-CM | POA: Diagnosis present

## 2019-01-11 DIAGNOSIS — N3281 Overactive bladder: Secondary | ICD-10-CM | POA: Diagnosis present

## 2019-01-11 DIAGNOSIS — D638 Anemia in other chronic diseases classified elsewhere: Secondary | ICD-10-CM | POA: Diagnosis present

## 2019-01-11 DIAGNOSIS — G4730 Sleep apnea, unspecified: Secondary | ICD-10-CM | POA: Diagnosis present

## 2019-01-11 DIAGNOSIS — E876 Hypokalemia: Secondary | ICD-10-CM | POA: Diagnosis present

## 2019-01-11 DIAGNOSIS — M19041 Primary osteoarthritis, right hand: Secondary | ICD-10-CM | POA: Diagnosis present

## 2019-01-11 DIAGNOSIS — I10 Essential (primary) hypertension: Secondary | ICD-10-CM | POA: Diagnosis not present

## 2019-01-11 DIAGNOSIS — E1122 Type 2 diabetes mellitus with diabetic chronic kidney disease: Secondary | ICD-10-CM | POA: Diagnosis present

## 2019-01-11 DIAGNOSIS — I4891 Unspecified atrial fibrillation: Secondary | ICD-10-CM | POA: Diagnosis present

## 2019-01-11 DIAGNOSIS — F0390 Unspecified dementia without behavioral disturbance: Secondary | ICD-10-CM | POA: Diagnosis present

## 2019-01-11 DIAGNOSIS — Z66 Do not resuscitate: Secondary | ICD-10-CM | POA: Diagnosis present

## 2019-01-11 DIAGNOSIS — E11649 Type 2 diabetes mellitus with hypoglycemia without coma: Secondary | ICD-10-CM | POA: Diagnosis not present

## 2019-01-11 DIAGNOSIS — I129 Hypertensive chronic kidney disease with stage 1 through stage 4 chronic kidney disease, or unspecified chronic kidney disease: Secondary | ICD-10-CM | POA: Diagnosis present

## 2019-01-11 DIAGNOSIS — R000 Tachycardia, unspecified: Secondary | ICD-10-CM | POA: Diagnosis present

## 2019-01-11 DIAGNOSIS — Z515 Encounter for palliative care: Secondary | ICD-10-CM | POA: Diagnosis present

## 2019-01-11 DIAGNOSIS — L89151 Pressure ulcer of sacral region, stage 1: Secondary | ICD-10-CM | POA: Diagnosis present

## 2019-01-11 DIAGNOSIS — K219 Gastro-esophageal reflux disease without esophagitis: Secondary | ICD-10-CM | POA: Diagnosis present

## 2019-01-11 DIAGNOSIS — N184 Chronic kidney disease, stage 4 (severe): Secondary | ICD-10-CM | POA: Diagnosis present

## 2019-01-11 DIAGNOSIS — K5090 Crohn's disease, unspecified, without complications: Secondary | ICD-10-CM | POA: Diagnosis present

## 2019-01-11 DIAGNOSIS — R0602 Shortness of breath: Secondary | ICD-10-CM | POA: Diagnosis present

## 2019-01-11 DIAGNOSIS — J189 Pneumonia, unspecified organism: Secondary | ICD-10-CM | POA: Diagnosis present

## 2019-01-11 DIAGNOSIS — E785 Hyperlipidemia, unspecified: Secondary | ICD-10-CM | POA: Diagnosis present

## 2019-01-11 DIAGNOSIS — M353 Polymyalgia rheumatica: Secondary | ICD-10-CM | POA: Diagnosis present

## 2019-01-11 DIAGNOSIS — E039 Hypothyroidism, unspecified: Secondary | ICD-10-CM | POA: Diagnosis present

## 2019-01-11 LAB — GLUCOSE, CAPILLARY
Glucose-Capillary: 108 mg/dL — ABNORMAL HIGH (ref 70–99)
Glucose-Capillary: 108 mg/dL — ABNORMAL HIGH (ref 70–99)
Glucose-Capillary: 92 mg/dL (ref 70–99)
Glucose-Capillary: 96 mg/dL (ref 70–99)

## 2019-01-11 LAB — CBC
HCT: 32.2 % — ABNORMAL LOW (ref 36.0–46.0)
Hemoglobin: 9.8 g/dL — ABNORMAL LOW (ref 12.0–15.0)
MCH: 30.1 pg (ref 26.0–34.0)
MCHC: 30.4 g/dL (ref 30.0–36.0)
MCV: 98.8 fL (ref 80.0–100.0)
NRBC: 0 % (ref 0.0–0.2)
Platelets: 172 10*3/uL (ref 150–400)
RBC: 3.26 MIL/uL — ABNORMAL LOW (ref 3.87–5.11)
RDW: 13.4 % (ref 11.5–15.5)
WBC: 6.1 10*3/uL (ref 4.0–10.5)

## 2019-01-11 LAB — BASIC METABOLIC PANEL
Anion gap: 7 (ref 5–15)
BUN: 22 mg/dL (ref 8–23)
CO2: 23 mmol/L (ref 22–32)
CREATININE: 1.1 mg/dL — AB (ref 0.44–1.00)
Calcium: 8.4 mg/dL — ABNORMAL LOW (ref 8.9–10.3)
Chloride: 107 mmol/L (ref 98–111)
GFR calc Af Amer: 49 mL/min — ABNORMAL LOW (ref >60)
GFR calc non Af Amer: 42 mL/min — ABNORMAL LOW (ref >60)
Glucose, Bld: 132 mg/dL — ABNORMAL HIGH (ref 70–99)
Potassium: 4.1 mmol/L (ref 3.5–5.1)
Sodium: 137 mmol/L (ref 135–145)

## 2019-01-11 MED ORDER — IPRATROPIUM BROMIDE 0.02 % IN SOLN
0.5000 mg | Freq: Three times a day (TID) | RESPIRATORY_TRACT | Status: DC
  Administered 2019-01-11 – 2019-01-14 (×11): 0.5 mg via RESPIRATORY_TRACT
  Filled 2019-01-11 (×11): qty 2.5

## 2019-01-11 MED ORDER — GUAIFENESIN ER 600 MG PO TB12
600.0000 mg | ORAL_TABLET | Freq: Two times a day (BID) | ORAL | Status: DC
  Administered 2019-01-11 – 2019-01-14 (×7): 600 mg via ORAL
  Filled 2019-01-11 (×7): qty 1

## 2019-01-11 MED ORDER — LEVALBUTEROL HCL 0.63 MG/3ML IN NEBU
0.6300 mg | INHALATION_SOLUTION | Freq: Three times a day (TID) | RESPIRATORY_TRACT | Status: DC
  Administered 2019-01-11 – 2019-01-14 (×11): 0.63 mg via RESPIRATORY_TRACT
  Filled 2019-01-11 (×11): qty 3

## 2019-01-11 MED ORDER — HEPARIN SODIUM (PORCINE) 5000 UNIT/ML IJ SOLN
5000.0000 [IU] | Freq: Two times a day (BID) | INTRAMUSCULAR | Status: DC
  Administered 2019-01-11 – 2019-01-14 (×6): 5000 [IU] via SUBCUTANEOUS
  Filled 2019-01-11 (×6): qty 1

## 2019-01-11 MED ORDER — LEVALBUTEROL HCL 0.63 MG/3ML IN NEBU
0.6300 mg | INHALATION_SOLUTION | Freq: Four times a day (QID) | RESPIRATORY_TRACT | Status: DC | PRN
  Administered 2019-01-13: 0.63 mg via RESPIRATORY_TRACT
  Filled 2019-01-11: qty 3

## 2019-01-11 NOTE — Progress Notes (Signed)
Rt gave pt flutter valve. Pt knows and understands how to use. 

## 2019-01-11 NOTE — Telephone Encounter (Signed)
PA for Diclofenac gel was approved through 01/12/2020.

## 2019-01-11 NOTE — Progress Notes (Signed)
PROGRESS NOTE    [name]  BUL:[id number] DOB: 06-22-1919 DOA: 01/10/2019 PCP: Briscoe Deutscher, DO    Brief Narrative:  85 y.o. female with past medical history significant for A. fib, Crohn's disease, diabetes, on hospice care who presents to the emergency department complaining of shortness of breath, cough that is started 2 days prior to admission.  She reports productive cough.  She also reports generalized weakness.  She has not been able to ambulate.  At baseline she is able to ambulate with a walker.  She also reports an episode of chest pain the night prior to admission, after a coughing spell.  Patient reports fever and chills.  Evaluation in the ED: Sodium 137, potassium 3.2, glucose 233, creatinine 1.2, calcium 8.6, alkaline phosphatase 136, BNP 152, lactic acid 2.5, white blood cell 8.7, hemoglobin 11.9.  Influenza negative, chest x-ray: Increased opacity in right mid lung, suspicious for pneumonia.  Assessment & Plan:   Principal Problem:   Community acquired pneumonia Active Problems:   Benign essential hypertension   GERD (gastroesophageal reflux disease)   Anemia of chronic disease   Type 2 diabetes mellitus with stage 3 chronic kidney disease (HCC)   CKD stage 4 due to type 2 diabetes mellitus ([place])   DNR (do not resuscitate)   Dementia without behavioral disturbance ([place])   Pneumonia   Pressure injury of skin   1-Community acquired pneumonia:\ -Patient presents with cough, fever, chest x-ray with pulmonary infiltrates, tachypnea,  tachycardic. -Pt is continued on ceftriaxone and azithromycin.  Would like to avoid Levaquin in a 85 year old to avoid delirium.  -Nebulizer treatment, Tussionex for cough. -Wean O2 as tolerated, was on 2LNC this AM -Influenza panel negative. -Given advanced age and frailty, would continue IV abx for the time being -Will give trial of flutter valve for secretions  2-Sinus tachycardia;  -Improved with IVF  hydration  3-Hypokalemia;  -Replaced -Repeat bmet in AM  4-DM; SSI.  Not on medications at home.   DVT prophylaxis: Heparin gtt Code Status: DNR Family Communication: Pt in room, family not at bedside Disposition Plan: Possible d/c in 24hrs if off O2 and afebrile  Consultants:   Procedures:     Antimicrobials: Anti-infectives (From admission, onward)   Start     Dose/Rate Route Frequency Ordered Stop   01/11/19 1600  azithromycin (ZITHROMAX) 500 mg in sodium chloride 0.9 % 250 mL IVPB     500 mg 250 mL/hr over 60 Minutes Intravenous Every 24 hours 01/10/19 1620     01/10/19 2200  oseltamivir (TAMIFLU) capsule 75 mg  Status:  Discontinued     75 mg Oral 2 times daily 01/10/19 1620 01/10/19 1802   01/10/19 1700  cefTRIAXone (ROCEPHIN) 1 g in sodium chloride 0.9 % 100 mL IVPB     1 g 200 mL/hr over 30 Minutes Intravenous Every 24 hours 01/10/19 1620     01/10/19 1430  levofloxacin (LEVAQUIN) IVPB 500 mg     500 mg 100 mL/hr over 60 Minutes Intravenous  Once 01/10/19 1425 01/10/19 1557       Subjective: Still feeling sob with increased secretions  Objective: Vitals:   01/11/19 0305 01/11/19 0626 01/11/19 0830 01/11/19 1328  BP:  (!) 160/62    Pulse:  62    Resp:  20    Temp:  (!) 97.4 F (36.3 C)    TempSrc:  Oral    SpO2: 98% 96% 98% 93%  Weight:      Height:  Intake/Output Summary (Last 24 hours) at 01/11/2019 1341 Last data filed at 01/11/2019 1100 Gross per 24 hour  Intake 2199.8 ml  Output -  Net 2199.8 ml   Filed Weights   01/10/19 1737  Weight: 48.1 kg    Examination:  General exam: Appears calm and comfortable  Respiratory system: Clear to auscultation. Respiratory effort normal. Coarse breath sounds Cardiovascular system: S1 & S2 heard, RRR. Gastrointestinal system: Abdomen is nondistended, soft and nontender. No organomegaly or masses felt. Normal bowel sounds heard. Central nervous system: Alert and oriented. No focal neurological  deficits. Extremities: Symmetric 5 x 5 power. Skin: No rashes, lesions or ulcers Psychiatry: Judgement and insight appear normal. Mood & affect appropriate.   Data Reviewed: I have personally reviewed following labs and imaging studies  CBC: Recent Labs  Lab 01/10/19 1329 01/11/19 0550  WBC 8.7 6.1  NEUTROABS 6.0  --   HGB 11.9* 9.8*  HCT 39.3 32.2*  MCV 98.5 98.8  PLT 199 161   Basic Metabolic Panel: Recent Labs  Lab 01/10/19 1441 01/11/19 0550  NA 137 137  K 3.2* 4.1  CL 103 107  CO2 23 23  GLUCOSE 233* 132*  BUN 22 22  CREATININE 1.21* 1.10*  CALCIUM 8.6* 8.4*   GFR: Estimated Creatinine Clearance: 22.1 mL/min (A) (by C-G formula based on SCr of 1.1 mg/dL (H)). Liver Function Tests: Recent Labs  Lab 01/10/19 1441  AST 24  ALT 12  ALKPHOS 136*  BILITOT 0.3  PROT 7.0  ALBUMIN 3.4*   No results for input(s): LIPASE, AMYLASE in the last 168 hours. No results for input(s): AMMONIA in the last 168 hours. Coagulation Profile: No results for input(s): INR, PROTIME in the last 168 hours. Cardiac Enzymes: No results for input(s): CKTOTAL, CKMB, CKMBINDEX, TROPONINI in the last 168 hours. BNP (last 3 results) No results for input(s): PROBNP in the last 8760 hours. HbA1C: No results for input(s): HGBA1C in the last 72 hours. CBG: Recent Labs  Lab 01/11/19 0733 01/11/19 1142  GLUCAP 108* 108*   Lipid Profile: No results for input(s): CHOL, HDL, LDLCALC, TRIG, CHOLHDL, LDLDIRECT in the last 72 hours. Thyroid Function Tests: No results for input(s): TSH, T4TOTAL, FREET4, T3FREE, THYROIDAB in the last 72 hours. Anemia Panel: No results for input(s): VITAMINB12, FOLATE, FERRITIN, TIBC, IRON, RETICCTPCT in the last 72 hours. Sepsis Labs: Recent Labs  Lab 01/10/19 1329  LATICACIDVEN 2.5*    Recent Results (from the past 240 hour(s))  MRSA PCR Screening     Status: None   Collection Time: 01/10/19  6:02 PM  Result Value Ref Range Status   MRSA by PCR  NEGATIVE NEGATIVE Final    Comment:        The GeneXpert MRSA Assay (FDA approved for NASAL specimens only), is one component of a comprehensive MRSA colonization surveillance program. It is not intended to diagnose MRSA infection nor to guide or monitor treatment for MRSA infections. Performed at [facility], [place] [address] [place], [place] [address]      Radiology Studies: Dg Chest Port 1 View  Result Date: 01/10/2019 CLINICAL DATA:  Respiratory distress. EXAM: PORTABLE CHEST 1 VIEW COMPARISON:  Chest x-ray dated November 01, 2017. FINDINGS: The heart size and mediastinal contours are within normal limits. Normal pulmonary vascularity. Atherosclerotic calcification of the aortic arch. Unchanged severe calcifications of the mitral annulus. Increased density in the right mid lung. No pleural effusion or pneumothorax. No acute osseous abnormality. IMPRESSION: 1. Increased opacity in  right mid lung, suspicious for pneumonia. Followup PA and lateral chest X-ray is recommended in 3-4 weeks following trial of antibiotic therapy to ensure resolution. Electronically Signed   By: Titus Dubin M.D.   On: 01/10/2019 13:57    Scheduled Meds: . heparin injection (subcutaneous)  5,000 Units Subcutaneous Q12H  . insulin aspart  0-9 Units Subcutaneous TID WC  . ipratropium  0.5 mg Nebulization TID  . levalbuterol  0.63 mg Nebulization TID  . lidocaine  1 patch Transdermal Q24H  . mouth rinse  15 mL Mouth Rinse BID  . pantoprazole  40 mg Oral Daily  . ropinirole  5 mg Oral QHS   Continuous Infusions: . sodium chloride 20 mL/hr at 01/10/19 1811  . sodium chloride 75 mL/hr at 01/11/19 0647  . azithromycin    . cefTRIAXone (ROCEPHIN)  IV Stopped (01/10/19 1718)     LOS: 0 days   Marylu Lund, MD [facility] Pager On Amion  If 7PM-7AM, please contact night-coverage 01/11/2019, 1:41 PM

## 2019-01-11 NOTE — Progress Notes (Signed)
[facility] Work note Patient is currently receiving hospice care at home for dx of protein calore malnutrition. She lives alone and has supportive neighbors and friends who attend to her needs. She has a pd cg for several hours that will do housekeeping, run errands 2x a week. Patient has a small dog in the home. Patient has limited family support and estrangement wit a son. Patient has been spending more time in bed over the past few months and has not been out of the home socially since Christmas. She can get up, ambulate to the bathroom, kitchen and den, yet that exertion causes exhaustion. She remains hopeful that the pneumonia can be treated and she can return home. Patient goal has been to remain at home as long as possible. Patient appears weak, yet able to express needs and hopes. Support offered.  Katherina Right, [place]

## 2019-01-11 NOTE — Plan of Care (Signed)
  Problem: Activity: Goal: Ability to tolerate increased activity will improve Outcome: Progressing   Problem: Clinical Measurements: Goal: Ability to maintain a body temperature in the normal range will improve Outcome: Progressing   Problem: Respiratory: Goal: Ability to maintain adequate ventilation will improve Outcome: Progressing Goal: Ability to maintain a clear airway will improve Outcome: Progressing   Problem: Education: Goal: Knowledge of General Education information will improve Description Including pain rating scale, medication(s)/side effects and non-pharmacologic comfort measures Outcome: Progressing   Problem: Health Behavior/Discharge Planning: Goal: Ability to manage health-related needs will improve Outcome: Progressing   Problem: Activity: Goal: Risk for activity intolerance will decrease Outcome: Progressing   Problem: Nutrition: Goal: Adequate nutrition will be maintained Outcome: Progressing   Problem: Elimination: Goal: Will not experience complications related to bowel motility Outcome: Progressing   Problem: Pain Managment: Goal: General experience of comfort will improve Outcome: Progressing   Problem: Skin Integrity: Goal: Risk for impaired skin integrity will decrease Outcome: Progressing

## 2019-01-11 NOTE — Progress Notes (Signed)
[facility] ([facility] GIP RN Note   [facility] RN made home visit yesterday due to pt being SOB.  RN arrives, finds pt agitated and restless, RR 34, SPO2 in the 80s, adventitious breath sounds, decision was made to transfer pt to the ED for further evaluation.  Pt has a [facility] diagnosis of protein calorie malnutrition per Dr. Lyman Speller.  This is a related and covered GIP admission with a diagnosis of SOB and cough.  Visited pt at the bedside.  She is alert and oriented per her normal.  She states she feels "terrible", but better than yesterday when she was admitted to the hospital.    V/S:  97.4 oral, 160/62, HR 62, RR 20, SPO2 96% [place], adventitious breath sounds audible  I&O: 1789, no output recorded  Pertinent abnormal lab work and diagnostics: creat 1.10, egfr 42, RBCs 3.26, hgb 9.8, HCT 32.2, influenza PCR negative.  Portable chest: IMPRESSION:  Increased opacity in right mid lung, suspicious for pneumonia.  CAP:  Infiltrates on xray, Azithromycin 500 mg IV QD, rocephin 1g IV QD, influenza panel negative, O2 at 2 lpm  IVs, PRN, continuous infusions:  NS @ 75 ml/hr, Azithromycin 500 mg IV QD, rocephin 1g IV QD, levaquis 530m IV x 1 (d/c'd due to risk of delirium), mag sulfate 2g IV x1, NS 500 ml bolus (in ED). Tylenol 650 mg po x 1, metoprolol 544mIV x 1 (for tachycardia)  IDT:  Updated  PCG:  Updated Don by phone  Transfer summary and medication list placed on shadow chart.  Please call with any hospice related questions or concerns.   JeVenia CarbonSN, RN [facility] (listed in [place]

## 2019-01-11 NOTE — Plan of Care (Signed)
  Problem: Activity: Goal: Ability to tolerate increased activity will improve Outcome: Progressing Note:  PT/OT eval pending.    Problem: Clinical Measurements: Goal: Ability to maintain a body temperature in the normal range will improve Outcome: Progressing   Problem: Respiratory: Goal: Ability to maintain adequate ventilation will improve Outcome: Progressing Goal: Ability to maintain a clear airway will improve Outcome: Progressing Note:  Tenacious congested cough.    Problem: Education: Goal: Knowledge of General Education information will improve Description Including pain rating scale, medication(s)/side effects and non-pharmacologic comfort measures Outcome: Progressing   Problem: Health Behavior/Discharge Planning: Goal: Ability to manage health-related needs will improve Outcome: Progressing   Problem: Activity: Goal: Risk for activity intolerance will decrease Outcome: Progressing   Problem: Nutrition: Goal: Adequate nutrition will be maintained Outcome: Progressing   Problem: Elimination: Goal: Will not experience complications related to bowel motility Outcome: Progressing   Problem: Pain Managment: Goal: General experience of comfort will improve Outcome: Progressing   Problem: Skin Integrity: Goal: Risk for impaired skin integrity will decrease Outcome: Progressing

## 2019-01-11 NOTE — Evaluation (Signed)
Physical Therapy Evaluation Patient Details Name: [name] MRN: [id number] DOB: [birth year] Today's Date: 01/11/2019   History of Present Illness  85 yo female admitted to ED on 01/10/19 with CAP. Pt with history of FTT, afib, acute gastritis w/o hemorrhage, anxiety, OA, CAP, Crohn disease, depression, DVT and PE, pancreatic cysts, RLS, DMII, dementia, CKD III, peripheral neuropathy, deconditioning.   Clinical Impression   Pt presents with physical deconditioning, difficulty performing bed mobility/transfers/gait, and decreased activity tolerance due to weakness and fatigue. Pt to benefit from acute PT to address deficits. Pt ambulated 6 ft in room today with RW, +2 assist for safety and min assist provided for steadying pt. Sats >90% on RA, with one period of SpO2 reading at 79% but this was with holding onto RW, pt with no complaints of dyspnea with this reading. PT recommending increased home care assist if available as pt requires min assist for all mobility and fatigues very quickly, if not pt may need ST-SNF placement to address mobility and assist needs at this time. PT to progress mobility as tolerated, and will continue to follow acutely.      Follow Up Recommendations Other; SNF;Supervision/Assistance - 24 hour(Home with hospice vs SNF, depending on if pt is able to get increased assist from aide and hospice)    Equipment Recommendations  None recommended by PT    Recommendations for Other Services       Precautions / Restrictions Precautions Precautions: Fall Restrictions Weight Bearing Restrictions: No      Mobility  Bed Mobility Overal bed mobility: Needs Assistance Bed Mobility: Supine to Sit     Supine to sit: Min assist;HOB elevated     General bed mobility comments: Min assist for scooting to EOB, sequencing, trunk elevation. Pt with reaching for bed rails to assist in sitting up. Pt able to sit EOB without assist ~5 minutes. Sats >=94% on RA.    Transfers Overall transfer level: Needs assistance Equipment used: Rolling walker (2 wheeled) Transfers: Sit to/from Stand Sit to Stand: Min assist;From elevated surface;+2 safety/equipment         General transfer comment: Min assist for power up, steadying. Upon initial standing, pt worried that she was urinating so she quickly sat back down. Pt had not, so pt performed second sit to stand.   Ambulation/Gait Ambulation/Gait assistance: Min assist;+2 safety/equipment Gait Distance (Feet): 6 Feet Assistive device: Rolling walker (2 wheeled) Gait Pattern/deviations: Step-through pattern;Decreased stride length;Shuffle;Trunk flexed Gait velocity: decr    General Gait Details: min assist for steadying and directing pt. Verbal cuing for placement in RW and turning towards recliner. Upon sitting in recliner, SpO2 monitor reading was 79% on RA. Suspect this was due to RW use, because when pt sat and released RW, sats returned to >90%. Pt also asymptomatic for dyspnea. Pt temporarily placed back on 2LO2 in case sat reading was accurate. RN notified.  Stairs            Wheelchair Mobility    Modified Rankin (Stroke Patients Only)       Balance Overall balance assessment: Needs assistance Sitting-balance support: No upper extremity supported Sitting balance-Leahy Scale: Fair Sitting balance - Comments: sat EOB ~5 minutes with no UE support or assist, able to scoot and move LEs without LOB when using UEs   Standing balance support: Bilateral upper extremity supported Standing balance-Leahy Scale: Poor Standing balance comment: relies on RW in standing  Pertinent Vitals/Pain Pain Assessment: No/denies pain    Home Living Family/patient expects to be discharged to:: Other (Comment)(Per RN and pt, pt comes from independent living facility)                      Prior Function Level of Independence: Needs assistance   Gait /  Transfers Assistance Needed: Pt mobilizes independently with use of RW in the home. Pt states she uses RW 100% of the time at this point, and walks to and from kitchen, to and from bathroom, etc throughout the day.   ADL's / Homemaking Assistance Needed: Pt has a caregiver come in to help around the house or assist pt with ADLs if needed 3x/week. Pt states she dresses self.  Comments: Pt has a dog named Baby, has had her for 1 year     Hand Dominance   Dominant Hand: Right    Extremity/Trunk Assessment   Upper Extremity Assessment Upper Extremity Assessment: Generalized weakness    Lower Extremity Assessment Lower Extremity Assessment: Generalized weakness;Defer to PT evaluation(Pt able to perform AROM hip flexion, hip extension, hip adduction and abduction, knee flexion and extension, and PF/DF in supine positioning. Effortful and increased time)    Cervical / Trunk Assessment Cervical / Trunk Assessment: Kyphotic  Communication   Communication: HOH  Cognition Arousal/Alertness: Awake/alert Behavior During Therapy: WFL for tasks assessed/performed Overall Cognitive Status: Within Functional Limits for tasks assessed                                 General Comments: Per chart review, pt with dementia but no signs of cognitive impairment during session. Pt A&O x3, very kind and answers all questions appropriately.       General Comments      Exercises     Assessment/Plan    PT Assessment Patient needs continued PT services  PT Problem List Decreased strength;Decreased activity tolerance;Decreased knowledge of use of DME;Decreased balance;Decreased mobility       PT Treatment Interventions DME instruction;Therapeutic activities;Gait training;Therapeutic exercise;Patient/family education;Balance training;Functional mobility training    PT Goals (Current goals can be found in the Care Plan section)  Acute Rehab PT Goals Patient Stated Goal: feel better PT  Goal Formulation: With patient Time For Goal Achievement: 01/25/19 Potential to Achieve Goals: Good    Frequency Min 2X/week   Barriers to discharge Decreased caregiver support Per notes, pt is estranged from son. Pt reports having good neighbors     Co-evaluation               AM-PAC PT "6 Clicks" Mobility  Outcome Measure Help needed turning from your back to your side while in a flat bed without using bedrails?: A Little Help needed moving from lying on your back to sitting on the side of a flat bed without using bedrails?: A Little Help needed moving to and from a bed to a chair (including a wheelchair)?: A Little Help needed standing up from a chair using your arms (e.g., wheelchair or bedside chair)?: A Little Help needed to walk in hospital room?: A Little Help needed climbing 3-5 steps with a railing? : A Lot 6 Click Score: 17    End of Session Equipment Utilized During Treatment: Gait belt Activity Tolerance: Patient limited by fatigue Patient left: in chair;with chair alarm set;with call bell/phone within reach;with family/visitor present Nurse Communication: Mobility status;Other (comment)(sats during mobility) PT Visit  Diagnosis: Other abnormalities of gait and mobility (R26.89);Difficulty in walking, not elsewhere classified (R26.2)    Time: 5747-3403 PT Time Calculation (min) (ACUTE ONLY): 30 min   Charges:   PT Evaluation $PT Eval Low Complexity: 1 Low PT Treatments $Gait Training: 8-22 mins       Julien Girt, PT Acute Rehabilitation Services Pager [phone]  Office [place] 01/11/2019, 2:40 PM

## 2019-01-12 ENCOUNTER — Telehealth: Payer: Self-pay

## 2019-01-12 DIAGNOSIS — N183 Chronic kidney disease, stage 3 (moderate): Secondary | ICD-10-CM

## 2019-01-12 DIAGNOSIS — K219 Gastro-esophageal reflux disease without esophagitis: Secondary | ICD-10-CM

## 2019-01-12 LAB — GLUCOSE, CAPILLARY
GLUCOSE-CAPILLARY: 87 mg/dL (ref 70–99)
Glucose-Capillary: 64 mg/dL — ABNORMAL LOW (ref 70–99)
Glucose-Capillary: 56 mg/dL — ABNORMAL LOW (ref 70–99)
Glucose-Capillary: 78 mg/dL (ref 70–99)
Glucose-Capillary: 95 mg/dL (ref 70–99)

## 2019-01-12 LAB — BASIC METABOLIC PANEL
ANION GAP: 8 (ref 5–15)
BUN: 16 mg/dL (ref 8–23)
CO2: 23 mmol/L (ref 22–32)
Calcium: 8.9 mg/dL (ref 8.9–10.3)
Chloride: 109 mmol/L (ref 98–111)
Creatinine, Ser: 0.93 mg/dL (ref 0.44–1.00)
GFR calc Af Amer: 60 mL/min — ABNORMAL LOW (ref >60)
GFR, EST NON AFRICAN AMERICAN: 52 mL/min — AB (ref >60)
Glucose, Bld: 79 mg/dL (ref 70–99)
Potassium: 4.2 mmol/L (ref 3.5–5.1)
Sodium: 140 mmol/L (ref 135–145)

## 2019-01-12 MED ORDER — MIRTAZAPINE 15 MG PO TABS
15.0000 mg | ORAL_TABLET | Freq: Every day | ORAL | Status: DC
  Administered 2019-01-12 – 2019-01-13 (×3): 15 mg via ORAL
  Filled 2019-01-12 (×3): qty 1

## 2019-01-12 NOTE — Telephone Encounter (Signed)
ppw In your box for your review.

## 2019-01-12 NOTE — Evaluation (Signed)
Occupational Therapy Evaluation Patient Details Name: [name] MRN: [id number] DOB: 11/07/1919 Today's Date: 01/12/2019    History of Present Illness 85 yo female admitted to ED on 01/10/19 with CAP. Pt with history of FTT, afib, acute gastritis w/o hemorrhage, anxiety, OA, CAP, Crohn disease, depression, DVT and PE, pancreatic cysts, RLS, DMII, dementia, CKD III, peripheral neuropathy, deconditioning.    Clinical Impression   Pt admitted with CAP. Pt currently with functional limitations due to the deficits listed below (see OT Problem List).  Pt will benefit from skilled OT to increase their safety and independence with ADL and functional mobility for ADL to facilitate discharge to venue listed below.      Follow Up Recommendations  Home health OT;Supervision/Assistance - 24 hour    Equipment Recommendations  3 in 1 bedside commode    Recommendations for Other Services       Precautions / Restrictions Precautions Precautions: Fall Restrictions Weight Bearing Restrictions: No      Mobility Bed Mobility Overal bed mobility: Needs Assistance Bed Mobility: Supine to Sit     Supine to sit: Min assist        Transfers Overall transfer level: Needs assistance Equipment used: Rolling walker (2 wheeled) Transfers: Sit to/from [facility] Sit to Stand: Min assist              Balance Overall balance assessment: Needs assistance Sitting-balance support: No upper extremity supported Sitting balance-Leahy Scale: Fair Sitting balance - Comments: sat EOB ~5 minutes with no UE support or assist, able to scoot and move LEs without LOB when using UEs   Standing balance support: Bilateral upper extremity supported Standing balance-Leahy Scale: Poor Standing balance comment: relies on RW in standing                           ADL either performed or assessed with clinical judgement   ADL Overall ADL's : Needs  assistance/impaired Eating/Feeding: Set up;Sitting   Grooming: Set up;Brushing hair;Sitting   Upper Body Bathing: Set up;Sitting   Lower Body Bathing: Moderate assistance;Sit to/from stand;Cueing for sequencing;Cueing for safety   Upper Body Dressing : Minimal assistance;Sitting   Lower Body Dressing: Moderate assistance;Sit to/from stand;Cueing for sequencing;Cueing for safety   Toilet Transfer: Minimal assistance;RW;Stand-pivot;BSC   Toileting- Clothing Manipulation and Hygiene: Minimal assistance;Sit to/from stand;Cueing for sequencing;Cueing for safety       Functional mobility during ADLs: Minimal assistance;Rolling walker;Cueing for sequencing                    Pertinent Vitals/Pain Pain Assessment: No/denies pain     Hand Dominance Right   Extremity/Trunk Assessment Upper Extremity Assessment Upper Extremity Assessment: Generalized weakness       Cervical / Trunk Assessment Cervical / Trunk Assessment: Kyphotic   Communication Communication Communication: HOH   Cognition Arousal/Alertness: Awake/alert Behavior During Therapy: WFL for tasks assessed/performed Overall Cognitive Status: Within Functional Limits for tasks assessed                                 General Comments: Per chart review, pt with dementia but no signs of cognitive impairment during session. Pt A&O x3, very kind and answers all questions appropriately.    General Comments               Home Living Family/patient expects to be discharged to:: Other (Comment)(Per RN  and pt, pt comes from independent living facility)                                        Prior Functioning/Environment Level of Independence: Needs assistance  Gait / Transfers Assistance Needed: Pt mobilizes independently with use of RW in the home. Pt states she uses RW 100% of the time at this point, and walks to and from kitchen, to and from bathroom, etc throughout the day.   ADL's / Homemaking Assistance Needed: Pt has a caregiver come in to help around the house or assist pt with ADLs if needed 3x/week. Pt states she dresses self.   Comments: Pt has a dog named Baby, has had her for 1 year        OT Problem List: Decreased strength;Decreased activity tolerance;Impaired balance (sitting and/or standing);Decreased knowledge of precautions;Decreased knowledge of use of DME or AE      OT Treatment/Interventions: Self-care/ADL training;Patient/family education    OT Goals(Current goals can be found in the care plan section) Acute Rehab OT Goals Patient Stated Goal: feel better OT Goal Formulation: With patient Time For Goal Achievement: 01/26/19  OT Frequency: Min 2X/week   Barriers to D/C:               AM-PAC OT "6 Clicks" Daily Activity     Outcome Measure Help from another person eating meals?: A Little Help from another person taking care of personal grooming?: A Little Help from another person toileting, which includes using toliet, bedpan, or urinal?: A Little Help from another person bathing (including washing, rinsing, drying)?: A Little Help from another person to put on and taking off regular upper body clothing?: A Little Help from another person to put on and taking off regular lower body clothing?: A Little 6 Click Score: 18   End of Session Equipment Utilized During Treatment: Rolling walker Nurse Communication: Mobility status  Activity Tolerance: Patient tolerated treatment well Patient left: in chair;with call bell/phone within reach;with chair alarm set  OT Visit Diagnosis: Unsteadiness on feet (R26.81);Other abnormalities of gait and mobility (R26.89);Muscle weakness (generalized) (M62.81)                Time: 0277-4128 OT Time Calculation (min): 26 min Charges:  OT General Charges $OT Visit: 1 Visit OT Evaluation $OT Eval Low Complexity: 1 Low OT Treatments $Self Care/Home Management : 8-22 mins  Kari Baars,  OT Acute Rehabilitation Services [phone] Office- [phone]     Giada Schoppe, Edwena Felty D 01/12/2019, 2:52 PM

## 2019-01-12 NOTE — Progress Notes (Signed)
Pt asking for something to help her sleep this evening. NP on call, Schorr, paged and mirtazapine 15mg  ordered at HS. About 0300, pt jumped up from her bed and seemed more confused than earlier in the night. A&Ox4 but needing to be redirected back into bed. Pt also pulled her IV out when standing up. Staff was able to walk pt to bathroom with walker and get her cleaned up. IV team called to place new IV. Will continue to monitor.

## 2019-01-12 NOTE — Progress Notes (Signed)
NP on call, Schorr, notified of pt's BP 176/74 manually. HR 66. Advised to give PRN lopressor per instructions, which state to give for sustained tachycardia >110. Not given at this time. Will continue to monitor.

## 2019-01-12 NOTE — Progress Notes (Signed)
[facility] GIP RN Note    [facility] RN made home visit yesterday due to pt being SOB.  RN arrives, finds pt agitated and restless, RR 34, SPO2 in the 80s, adventitious breath sounds, decision was made to transfer pt to the ED for further evaluation.  Pt has a [facility] diagnosis of protein calorie malnutrition per Dr. Lyman Speller.  This is a related and covered GIP admission with a diagnosis of SOB and cough.  Visited pt at the bedside.  She is alert and oriented, picking at her covers and repeating information to herself.  She is able to be redirected.  She states she has slept terribly for the last two nights.  She was concerned why she was confused.  Discussed delirium with different environment, infectious process and sleep wake cycle.  Remeron 15 mg PO HS was added to assist with sleep.  V/S:  97.7 oral, 150/90, HR 65, RR 20, SPO2 95% 2 lpm [place]  I&O: 1205/900  Pertinent abnormal lab work and diagnostics:  Lab work WNL  Portable chest: (2/3) IMPRESSION:  Increased opacity in right mid lung, suspicious for pneumonia.  CAP:  Infiltrates on xray, Azithromycin 500 mg IV QD, rocephin 1g IV QD, influenza panel negative, O2 at 2 lpm, flutter valve at bedside  IVs, PRN, continuous infusions:  NS @ 75 ml/hr, Azithromycin 500 mg IV QD, rocephin 1g IV QD  IDT:  Updated  PCG:  Updated Don by phone  Please use [facility] for ambulance transportation at discharge, as they contract this service for [facility].  Please call with any hospice related questions or concerns.   Venia Carbon BSN, RN [facility] Liaison (listed in [place]

## 2019-01-12 NOTE — Evaluation (Addendum)
Clinical/Bedside Swallow Evaluation Patient Details  Name: [name] MRN: [id number] Date of Birth: 1919-06-08  Today's Date: 01/12/2019 Time: SLP Start Time (ACUTE ONLY): 8891 SLP Stop Time (ACUTE ONLY): 1710 SLP Time Calculation (min) (ACUTE ONLY): 35 min  Past Medical History:  Past Medical History:  Diagnosis Date  . Acute gastritis without mention of hemorrhage   . Anemia   . Anxiety   . Arthritis    "LLL; palm of right hand" (06/12/2015)  . Atrial fibrillation ([place])   . CAP (community acquired pneumonia) 04/03/2014  . Crohn disease ([place])   . Cyst and pseudocyst of pancreas   . Depression   . Diaphragmatic hernia without mention of obstruction or gangrene   . Diverticulosis of colon (without mention of hemorrhage)   . Duodenitis without mention of hemorrhage   . DVT (deep vein thrombosis) in pregnancy 11/22/2017  . DVT (deep venous thrombosis) ([place])    "twice on the left leg; once on the right leg" (06/12/2015)  . Dysrhythmia   . Esophageal reflux   . Hiatal hernia- moderate to large 10/06/2014  . Hx pulmonary embolism   . Hypothyroidism   . Insomnia, unspecified   . Left leg DVT ([place]) jan 2013  . Migraine    hx  . OAB (overactive bladder)   . Other and unspecified hyperlipidemia   . Other malaise and fatigue   . Pancreas divisum 03/02/2015  . Pancreatic cyst   . Personal history of unspecified digestive disease   . Polymyalgia rheumatica ([place])   . Protein-calorie malnutrition, severe ([place]) 10/02/2014  . Restless legs syndrome (RLS)   . Shingles   . Stricture and stenosis of esophagus   . Type II diabetes mellitus ([place])   . Unspecified essential hypertension   . Unspecified sleep apnea    hx of sleep apnea, none since weight loss (06/12/2015)  . Urinary frequency   . Urinary urgency    Past Surgical History:  Past Surgical History:  Procedure Laterality Date  . BACK SURGERY    . CATARACT EXTRACTION, BILATERAL Bilateral   . CLOSED REDUCTION NASAL FRACTURE   08/2008   Archie Endo 04/10/2011  . COLON SURGERY  June 17, 2012   surgery for bleed  . COLONOSCOPY N/A 06/13/2015   Procedure: COLONOSCOPY;  Surgeon: Irene Shipper, MD;  Location: [facility];  Service: Endoscopy;  Laterality: N/A;  . OOPHORECTOMY  1944   not sure which ovary  . POSTERIOR LUMBAR FUSION  01/2009   L4-L5 Archie Endo 04/09/2011  . REDUCTION MAMMAPLASTY Bilateral 1980  . ROTATOR CUFF REPAIR Right   . SHOULDER OPEN ROTATOR CUFF REPAIR  08/04/2012   Procedure: ROTATOR CUFF REPAIR SHOULDER OPEN;  Surgeon: Tobi Bastos, MD;  Location: [facility];  Service: Orthopedics;  Laterality: Left;  with Anchors and Graft  . SHOULDER OPEN ROTATOR CUFF REPAIR Right 2006   Archie Endo 04/22/2011  . TONSILLECTOMY     HPI:  85 yo female adm to [facility] with respiratory difficulties. PMH + for esophageal dysphagia, pna, stricture, RLS, PMR, dementia and SOB.  CXR during this hospital stay showed right middle lobe pna.  Pt apparently choked on some food during hospital stay and thus swallow evaluation was ordered.  Pt is on hospice for malnutrition per MD.     Assessment / Plan / Recommendation Clinical Impression  Patient presents with negative cranial nerve exam and denies neurological history.  She did not pass [facility] swallow screen due to requiring respiratory breaks.  Cough at baseline (  wet and congested) and cough during intake noted - after approx 20% of swallows of icecream and water (approx 12 swallows total).  No indication of any difficulties with graham cracker and she took very small boluses.  Pt educated to results of prior UGI study and esophageal precautions to mitigate her dysphagia.    Pt provided information in detail regarding foods that are problematic for her (rice, cornbread, hard meats).  She reports at times she has to regurgitate her food as she senses it lodge = pointing to proximal esophagus.  She states food is accompanied by secretions which SLP presumes is due to known esophageal deficit.     Pt  only has a poor dentition * few lowers and no uppers - which diminishes her mastication abilities to help clear esophagus.    She recalls choking on chicken during hospital stay and is agreeable to modification to mechanical soft/Dys3.  Advised her to specific meats at [facility] to avoid ordering including pork loin, chicken and advised she choose pot roast, chicken salad, fish, etc.   Pt also admits to gustatory changes = and prefers foods/drinks with amplified flavor.   Provided pt with written compensations - will follow up x1 to assure tolerance with compensation strategies and diet modification.   SLP Visit Diagnosis: Dysphagia, unspecified (R13.10)    Aspiration Risk  Moderate aspiration risk    Diet Recommendation Dysphagia 3 (Mech soft);Thin liquid   Liquid Administration via: Cup;Straw Medication Administration: (as tolerated) Supervision: Patient able to self feed Compensations: Small sips/bites;Slow rate(drink liquids t/o meal) Postural Changes: Seated upright at 90 degrees(stay up one hour after a meal if able)    Other  Recommendations Oral Care Recommendations: Oral care BID Other Recommendations: Have oral suction available(to help clear secretons if needed)   Follow up Recommendations        Frequency and Duration min 1 x/week  1 week       Prognosis Prognosis for Safe Diet Advancement: Good Barriers to Reach Goals: Time post onset      Swallow Study   General Date of Onset: 01/12/19 HPI: 85 yo female adm to [facility] with respiratory difficulties. PMH + for esophageal dysphagia, pna, stricture, RLS, PMR, dementia and SOB.  CXR during this hospital stay showed right middle lobe pna.  Pt apparently choked on some food during hospital stay and thus swallow evaluation was ordered.  Pt is on hospice for malnutrition per MD.   Type of Study: Bedside Swallow Evaluation Previous Swallow Assessment: UGI 03/2006 There is marked dilatation of the upper esophagus with severe  dysmotility. The mid and distal esophagus is less dilated. There is a large hiatal hernia. The gastroesophageal junction is above the diaphragm indicating that this is a sliding type hernia as opposed to a paraesophageal hernia. There is persistent narrowing of the distal esophagus at the gastroesophageal junction, consistent with a stricture. This area never distends fully. I was not able to demonstrate any reflux today per radiologist report.  Diet Prior to this Study: Regular;Thin liquids Temperature Spikes Noted: No Respiratory Status: Nasal cannula History of Recent Intubation: No Behavior/Cognition: Alert;Cooperative;Pleasant mood Oral Cavity Assessment: Within Functional Limits Oral Care Completed by SLP: No Oral Cavity - Dentition: Other (Comment)(frontal dentition lower, no uppers!) Vision: Functional for self-feeding Self-Feeding Abilities: Able to feed self Patient Positioning: Upright in bed Baseline Vocal Quality: Low vocal intensity Volitional Cough: Congested Volitional Swallow: Able to elicit    Oral/Motor/Sensory Function Overall Oral Motor/Sensory Function: Within functional limits  Ice Chips Ice chips: Not tested   Thin Liquid Thin Liquid: Impaired Presentation: Cup;Straw Other Comments: pt with cough at baseline and during intake, can not rule out coorelated to aspiration but she denies difficulty swallowing liquids     Nectar Thick Nectar Thick Liquid: Not tested   Honey Thick Honey Thick Liquid: Not tested   Puree Presentation: Spoon;Self Fed Other Comments: icecream swallows followed by throat clearing, pt admits to increased difficulty at times with milk products, ? if adheres to secretions retained in pharynx and worsens cough   Solid     Solid: Within functional limits Presentation: Self Fed Other Comments: graham cracker, pt took very small bites and masticated well - no indication of difficulties with small bites      Macario Golds 01/12/2019,6:04  PM   Luanna Salk, [facility] SLP [place] Pager [phone] Office [phone]

## 2019-01-12 NOTE — Plan of Care (Signed)
  Problem: Activity: Goal: Ability to tolerate increased activity will improve Outcome: Progressing Note:  PT following pt.    Problem: Clinical Measurements: Goal: Ability to maintain a body temperature in the normal range will improve Outcome: Progressing   Problem: Respiratory: Goal: Ability to maintain adequate ventilation will improve Outcome: Progressing Goal: Ability to maintain a clear airway will improve Outcome: Progressing   Problem: Education: Goal: Knowledge of General Education information will improve Description Including pain rating scale, medication(s)/side effects and non-pharmacologic comfort measures Outcome: Progressing   Problem: Health Behavior/Discharge Planning: Goal: Ability to manage health-related needs will improve Outcome: Progressing   Problem: Activity: Goal: Risk for activity intolerance will decrease Outcome: Progressing   Problem: Nutrition: Goal: Adequate nutrition will be maintained Outcome: Progressing   Problem: Elimination: Goal: Will not experience complications related to bowel motility Outcome: Progressing   Problem: Pain Managment: Goal: General experience of comfort will improve Outcome: Progressing   Problem: Skin Integrity: Goal: Risk for impaired skin integrity will decrease Outcome: Progressing

## 2019-01-12 NOTE — Progress Notes (Signed)
PROGRESS NOTE  [name]  XNT:[id number] DOB: 04/11/1919 DOA: 01/10/2019 PCP: Briscoe Deutscher, DO Brief Narrative: 85 y.o.femalewith past medical history significant for A. fib, Crohn's disease, diabetes, on hospice care who presents to the emergency department complaining of shortness of breath, cough that is started 2 days prior to admission. She reports productive cough. She also reports generalized weakness. She has not been able to ambulate. At baseline she is able to ambulate with a walker. She also reports an episode of chest pain the night prior to admission, after acoughing spell. Patient reports fever andchills.  Evaluation in the ED: Sodium 137, potassium 3.2, glucose 233, creatinine 1.2, calcium 8.6, alkaline phosphatase 136, BNP 152, lactic acid 2.5, white blood cell 8.7, hemoglobin 11.9. Influenza negative, chest x-ray: Increased opacity in right mid lung, suspicious for pneumonia.  Assessment & Plan: Principal Problem:   Community acquired pneumonia Active Problems:   Benign essential hypertension   GERD (gastroesophageal reflux disease)   Anemia of chronic disease   Type 2 diabetes mellitus with stage 3 chronic kidney disease (HCC)   CKD stage 4 due to type 2 diabetes mellitus ([place])   DNR (do not resuscitate)   Dementia without behavioral disturbance ([place])   Pneumonia   Pressure injury of skin  RML CAP: Patient presented with cough, fever, chest x-ray with pulmonary infiltrates, tachypnea,tachycardic. - Pt is continued on ceftriaxone and azithromycin. Would like to avoid Levaquin in a 85 year old to avoid delirium.  - Nebulizer treatment, Tussionex for cough. - SLP evaluation requested as she has h/o esophageal dysphagia and choking episode 2/4. Would benefit from compensation strategies, though will allow eating without restrictions given GOC is comfort.  Acute hypoxic respiratory failure: Expect protracted recovery for this 85yo F with pneumonia.  -  Continues to require 2L by [place]. Wean O2 as tolerated today.   Sinus tachycardia; -Improved with IVF hydration  Hypokalemia:   T2DM with hypoglycemia:  - DC SSI, check CBGs until sure no longer hypoglycemic.   Stage I coccyx pressure injury, POA:  - Offload as able - Optimize nutritional status  Acute delirium:  - Remeron qHS to assist with circadian cycling.  DVT prophylaxis: Heparin Code Status: DNR Family Communication: None at bedside Disposition Plan: Home with hospice once stabilized. May have to DC with oxygen in next 24-48 hours.  Consultants:   None  Procedures:   None  Antimicrobials:  Ceftriaxone, azithromycin   Subjective: Breathing better than admission but still moderately dyspneic when attempting to get around.   Objective: Vitals:   01/11/19 2047 01/12/19 0617 01/12/19 0728 01/12/19 1331  BP: (!) 176/74 (!) 150/90  135/78  Pulse:  65  69  Resp:  20  18  Temp:  97.7 F (36.5 C)  98.7 F (37.1 C)  TempSrc:  Oral  Oral  SpO2:  95% 94% 96%  Weight:      Height:        Intake/Output Summary (Last 24 hours) at 01/12/2019 1751 Last data filed at 01/12/2019 1500 Gross per 24 hour  Intake 1693.32 ml  Output 1600 ml  Net 93.32 ml   Filed Weights   01/10/19 1737  Weight: 48.1 kg    Gen: Frail, elderly female in no acute distress Pulm: Non-labored tachypnea with 2LPM. Diminished but clear to auscultation bilaterally.  CV: Regular rate and rhythm. No murmur, rub, or gallop. No JVD, no pedal edema. GI: Abdomen soft, non-tender, non-distended, with normoactive bowel sounds. No organomegaly or masses felt. Ext: Warm, no  deformities Skin: No rashes, lesions no ulcers Neuro: Alert and oriented. No focal neurological deficits. Psych: Judgement and insight appear normal. Mood & affect appropriate.   Data Reviewed: I have personally reviewed following labs and imaging studies  CBC: Recent Labs  Lab 01/10/19 1329 01/11/19 0550  WBC 8.7 6.1    NEUTROABS 6.0  --   HGB 11.9* 9.8*  HCT 39.3 32.2*  MCV 98.5 98.8  PLT 199 481   Basic Metabolic Panel: Recent Labs  Lab 01/10/19 1441 01/11/19 0550 01/12/19 0559  NA 137 137 140  K 3.2* 4.1 4.2  CL 103 107 109  CO2 23 23 23   GLUCOSE 233* 132* 79  BUN 22 22 16   CREATININE 1.21* 1.10* 0.93  CALCIUM 8.6* 8.4* 8.9   GFR: Estimated Creatinine Clearance: 26.1 mL/min (by C-G formula based on SCr of 0.93 mg/dL). Liver Function Tests: Recent Labs  Lab 01/10/19 1441  AST 24  ALT 12  ALKPHOS 136*  BILITOT 0.3  PROT 7.0  ALBUMIN 3.4*   No results for input(s): LIPASE, AMYLASE in the last 168 hours. No results for input(s): AMMONIA in the last 168 hours. Coagulation Profile: No results for input(s): INR, PROTIME in the last 168 hours. Cardiac Enzymes: No results for input(s): CKTOTAL, CKMB, CKMBINDEX, TROPONINI in the last 168 hours. BNP (last 3 results) No results for input(s): PROBNP in the last 8760 hours. HbA1C: No results for input(s): HGBA1C in the last 72 hours. CBG: Recent Labs  Lab 01/12/19 0815 01/12/19 0858 01/12/19 0937 01/12/19 1221 01/12/19 1659  GLUCAP 64* 56* 87 78 95   Lipid Profile: No results for input(s): CHOL, HDL, LDLCALC, TRIG, CHOLHDL, LDLDIRECT in the last 72 hours. Thyroid Function Tests: No results for input(s): TSH, T4TOTAL, FREET4, T3FREE, THYROIDAB in the last 72 hours. Anemia Panel: No results for input(s): VITAMINB12, FOLATE, FERRITIN, TIBC, IRON, RETICCTPCT in the last 72 hours. Urine analysis:    Component Value Date/Time   COLORURINE YELLOW 05/17/2018 1429   APPEARANCEUR CLEAR 05/17/2018 1429   LABSPEC 1.015 05/17/2018 1429   PHURINE 5.5 05/17/2018 1429   GLUCOSEU NEGATIVE 05/17/2018 1429   HGBUR NEGATIVE 05/17/2018 1429   BILIRUBINUR NEGATIVE 05/17/2018 1429   KETONESUR NEGATIVE 05/17/2018 1429   PROTEINUR NEGATIVE 12/24/2017 2146   UROBILINOGEN 0.2 05/17/2018 1429   NITRITE NEGATIVE 05/17/2018 1429   LEUKOCYTESUR  TRACE (A) 05/17/2018 1429   Recent Results (from the past 240 hour(s))  MRSA PCR Screening     Status: None   Collection Time: 01/10/19  6:02 PM  Result Value Ref Range Status   MRSA by PCR NEGATIVE NEGATIVE Final    Comment:        The GeneXpert MRSA Assay (FDA approved for NASAL specimens only), is one component of a comprehensive MRSA colonization surveillance program. It is not intended to diagnose MRSA infection nor to guide or monitor treatment for MRSA infections. Performed at [facility], [place] [address] [place], [place] [address]       Radiology Studies: No results found.  Scheduled Meds: . guaiFENesin  600 mg Oral BID  . heparin injection (subcutaneous)  5,000 Units Subcutaneous Q12H  . insulin aspart  0-9 Units Subcutaneous TID WC  . ipratropium  0.5 mg Nebulization TID  . levalbuterol  0.63 mg Nebulization TID  . lidocaine  1 patch Transdermal Q24H  . mouth rinse  15 mL Mouth Rinse BID  . mirtazapine  15 mg Oral QHS  . pantoprazole  40 mg Oral  Daily  . ropinirole  5 mg Oral QHS   Continuous Infusions: . sodium chloride 20 mL/hr at 01/10/19 1811  . sodium chloride 75 mL/hr at 01/12/19 0613  . azithromycin 500 mg (01/12/19 1554)  . cefTRIAXone (ROCEPHIN)  IV Stopped (01/11/19 1809)     LOS: 1 day   Time spent: 25 minutes.  Patrecia Pour, MD [facility] [email or web address] Password [facility] 01/12/2019, 5:51 PM

## 2019-01-12 NOTE — Progress Notes (Signed)
Hypoglycemic Event  CBG: 65  Treatment:4 oz OJ Symptoms: none  Follow-up CBG: Time:0900 CBG Result:56  Treatment: pt eating breakfast (grits w/ sugar & tea w/ sugar)  F/u CBG: 87  Possible Reasons for Event:Minimal PO intake  Comments/MD notified: Dr Kingsley Plan, [place] Maw

## 2019-01-12 NOTE — Progress Notes (Signed)
PT continues to demonstrate hands on understanding of Flutter  Device- NPC at this time.

## 2019-01-13 MED ORDER — FUROSEMIDE 10 MG/ML IJ SOLN
20.0000 mg | Freq: Once | INTRAMUSCULAR | Status: AC
  Administered 2019-01-13: 20 mg via INTRAVENOUS
  Filled 2019-01-13: qty 2

## 2019-01-13 MED ORDER — LORAZEPAM 0.5 MG PO TABS
0.2500 mg | ORAL_TABLET | Freq: Four times a day (QID) | ORAL | Status: DC | PRN
  Administered 2019-01-14: 0.5 mg via ORAL
  Filled 2019-01-13: qty 1

## 2019-01-13 NOTE — Progress Notes (Signed)
Patient calling out multiple times this evening for shortness of breath.  02 saturations are in the 90's on 2L.  Breath sounds are very rhonchus.  Respiratory called and breathing treatment given.  MD made aware.  Fluids stopped and 20mg  of IV lasix given.  Will continue to monitor closely.

## 2019-01-13 NOTE — Telephone Encounter (Signed)
Completed. 01/12/2019

## 2019-01-13 NOTE — Progress Notes (Signed)
PROGRESS NOTE  [name]  GLO:[id number] DOB: [birth year] DOA: 01/10/2019 PCP: Briscoe Deutscher, DO Brief Narrative: 85 y.o.femalewith past medical history significant for A. fib, Crohn's disease, diabetes, on hospice care who presents to the emergency department complaining of shortness of breath, cough that is started 2 days prior to admission. She reports productive cough. She also reports generalized weakness. She has not been able to ambulate. At baseline she is able to ambulate with a walker. She also reports an episode of chest pain the night prior to admission, after acoughing spell. Patient reports fever andchills.  Evaluation in the ED: Sodium 137, potassium 3.2, glucose 233, creatinine 1.2, calcium 8.6, alkaline phosphatase 136, BNP 152, lactic acid 2.5, white blood cell 8.7, hemoglobin 11.9. Influenza negative, chest x-ray: Increased opacity in right mid lung, suspicious for pneumonia.  Assessment & Plan: Principal Problem:   Community acquired pneumonia Active Problems:   Benign essential hypertension   GERD (gastroesophageal reflux disease)   Anemia of chronic disease   Type 2 diabetes mellitus with stage 3 chronic kidney disease (HCC)   CKD stage 4 due to type 2 diabetes mellitus ([place])   DNR (do not resuscitate)   Dementia without behavioral disturbance ([place])   Pneumonia   Pressure injury of skin  RML CAP: Patient presented with cough, fever, chest x-ray with pulmonary infiltrates, tachypnea,tachycardic. - Pt is continued on ceftriaxone and azithromycin. Has received 3 days Tx. Would like to avoid Levaquin in a 85 year old to avoid delirium.  - Nebulizer treatment, Tussionex for cough. - SLP evaluation > Dys 3 diet. Poor dentition puts at risk with denser foods. Would benefit from compensation strategies, though will allow eating without restrictions given GOC is comfort.  Acute hypoxic respiratory failure: Expect protracted recovery for this 85yo F with  pneumonia.  - Continues to require 2L by [place]. Wean O2 as tolerated.  Sinus tachycardia; - Improved with IVF hydration  Hypokalemia: Resolved.  T2DM with hypoglycemia:  - DC SSI, check CBGs until sure no longer hypoglycemic.   Stage I coccyx pressure injury, POA:  - Offload as able - Optimize nutritional status  Acute delirium:  - Remeron qHS to assist with circadian cycling.  DVT prophylaxis: Heparin Code Status: DNR Family Communication: None at bedside Disposition Plan: Home with hospice once stabilized. Continue to expect protracted recovery with significant risk of decline due to age and frailty. Will remain inpatient for another 24 hours.  Consultants:   None  Procedures:   None  Antimicrobials:  Ceftriaxone, azithromycin   Subjective: Feels worse today, tired, weak, starting to eat more this morning, but "knows" she can't go home today.   Objective: Vitals:   01/13/19 0501 01/13/19 0710 01/13/19 1320 01/13/19 1353  BP: (!) 192/86  (!) 168/72   Pulse: 62  62   Resp: 16  (!) 24   Temp: 97.6 F (36.4 C)  97.7 F (36.5 C)   TempSrc:   Oral   SpO2: 96% 96% 100% 99%  Weight:      Height:        Intake/Output Summary (Last 24 hours) at 01/13/2019 1446 Last data filed at 01/13/2019 1300 Gross per 24 hour  Intake 1138.23 ml  Output 2150 ml  Net -1011.77 ml   Filed Weights   01/10/19 1737  Weight: 48.1 kg   Gen: Elderly female in no distress Pulm: Nonlabored but tachypneic with frequent wet cough on 2L, diminished diffusely. CV: Regular rate and rhythm. No murmur, rub, or gallop. No JVD,  no dependent edema. GI: Abdomen soft, non-tender, non-distended, with normoactive bowel sounds.  Ext: Warm, no deformities. Decreased muscle bulk. Skin: No rashes, lesions or ulcers on visualized skin. Neuro: Alert and oriented. No focal neurological deficits. Psych: Judgement and insight appear fair. Mood euthymic & affect congruent. Behavior is appropriate.     Data Reviewed: I have personally reviewed following labs and imaging studies  CBC: Recent Labs  Lab 01/10/19 1329 01/11/19 0550  WBC 8.7 6.1  NEUTROABS 6.0  --   HGB 11.9* 9.8*  HCT 39.3 32.2*  MCV 98.5 98.8  PLT 199 765   Basic Metabolic Panel: Recent Labs  Lab 01/10/19 1441 01/11/19 0550 01/12/19 0559  NA 137 137 140  K 3.2* 4.1 4.2  CL 103 107 109  CO2 23 23 23   GLUCOSE 233* 132* 79  BUN 22 22 16   CREATININE 1.21* 1.10* 0.93  CALCIUM 8.6* 8.4* 8.9   GFR: Estimated Creatinine Clearance: 26.1 mL/min (by C-G formula based on SCr of 0.93 mg/dL). Liver Function Tests: Recent Labs  Lab 01/10/19 1441  AST 24  ALT 12  ALKPHOS 136*  BILITOT 0.3  PROT 7.0  ALBUMIN 3.4*   No results for input(s): LIPASE, AMYLASE in the last 168 hours. No results for input(s): AMMONIA in the last 168 hours. Coagulation Profile: No results for input(s): INR, PROTIME in the last 168 hours. Cardiac Enzymes: No results for input(s): CKTOTAL, CKMB, CKMBINDEX, TROPONINI in the last 168 hours. BNP (last 3 results) No results for input(s): PROBNP in the last 8760 hours. HbA1C: No results for input(s): HGBA1C in the last 72 hours. CBG: Recent Labs  Lab 01/12/19 0815 01/12/19 0858 01/12/19 0937 01/12/19 1221 01/12/19 1659  GLUCAP 64* 56* 87 78 95   Lipid Profile: No results for input(s): CHOL, HDL, LDLCALC, TRIG, CHOLHDL, LDLDIRECT in the last 72 hours. Thyroid Function Tests: No results for input(s): TSH, T4TOTAL, FREET4, T3FREE, THYROIDAB in the last 72 hours. Anemia Panel: No results for input(s): VITAMINB12, FOLATE, FERRITIN, TIBC, IRON, RETICCTPCT in the last 72 hours. Urine analysis:    Component Value Date/Time   COLORURINE YELLOW 05/17/2018 1429   APPEARANCEUR CLEAR 05/17/2018 1429   LABSPEC 1.015 05/17/2018 1429   PHURINE 5.5 05/17/2018 1429   GLUCOSEU NEGATIVE 05/17/2018 1429   HGBUR NEGATIVE 05/17/2018 1429   BILIRUBINUR NEGATIVE 05/17/2018 1429   KETONESUR  NEGATIVE 05/17/2018 1429   PROTEINUR NEGATIVE 12/24/2017 2146   UROBILINOGEN 0.2 05/17/2018 1429   NITRITE NEGATIVE 05/17/2018 1429   LEUKOCYTESUR TRACE (A) 05/17/2018 1429   Recent Results (from the past 240 hour(s))  MRSA PCR Screening     Status: None   Collection Time: 01/10/19  6:02 PM  Result Value Ref Range Status   MRSA by PCR NEGATIVE NEGATIVE Final    Comment:        The GeneXpert MRSA Assay (FDA approved for NASAL specimens only), is one component of a comprehensive MRSA colonization surveillance program. It is not intended to diagnose MRSA infection nor to guide or monitor treatment for MRSA infections. Performed at [facility], [place] [address] [place], [place] [address]       Radiology Studies: No results found.  Scheduled Meds: . guaiFENesin  600 mg Oral BID  . heparin injection (subcutaneous)  5,000 Units Subcutaneous Q12H  . ipratropium  0.5 mg Nebulization TID  . levalbuterol  0.63 mg Nebulization TID  . lidocaine  1 patch Transdermal Q24H  . mouth rinse  15 mL Mouth Rinse  BID  . mirtazapine  15 mg Oral QHS  . pantoprazole  40 mg Oral Daily  . ropinirole  5 mg Oral QHS   Continuous Infusions: . sodium chloride 20 mL/hr at 01/10/19 1811  . sodium chloride 75 mL/hr at 01/13/19 0644  . azithromycin 500 mg (01/12/19 1554)  . cefTRIAXone (ROCEPHIN)  IV 1 g (01/12/19 1803)     LOS: 2 days   Time spent: 25 minutes.  Patrecia Pour, MD [facility] [email or web address] Password [email or web address] 01/13/2019, 2:46 PM

## 2019-01-13 NOTE — Progress Notes (Addendum)
[facility] ([facility]) GIP RN Note  [facility] RN made home visit yesterday due to pt being SOB. RN arrives, finds pt agitated and restless, RR 34, SPO2 in the 80s, adventitious breath sounds, decision was made to transfer pt to the ED for further evaluation. Pt has a [facility] diagnosis of protein calorie malnutrition per Dr. Lyman Speller. This is a related and covered GIP admission with a diagnosis of SOB and cough.  Visited patient at bedside. No visitors present.  She is awake and states she slept well. She had just received her breakfast and was starting to eat when I arrived. She continues to cough and reports feeling like food is "catching" in her throat. She asked family friend Timmothy Sours to bring her knitting from home, as she is tired of just sitting.   VS: 192/86, 62, 16, 96% on 2Lnc, afebrile I&O: 1138/2350  Abnormal labs: (02/05) GFR 52  No new imaging.  CAP: Infiltrates on xray, Azithromycin 500 mg IV QD, rocephin 1g IV QD, influenza panel negative, O2 at 2 lpm, flutter valve at bedside  Per MD notes: May have to DC with oxygen in next 24-48 hours.  IDT: Updated  PCG: Updated Don by phone  Please use [facility] for ambulance transportation at discharge, as they contract this service for [facility].  Please call with any hospice related questions or concerns.  Farrel Gordon, RN, [facility] Liaison (listed on [place]) [phone]

## 2019-01-13 NOTE — Progress Notes (Signed)
Physical Therapy Treatment Patient Details Name: [name] MRN: [id number] DOB: 1919/01/22 Today's Date: 01/13/2019    History of Present Illness 85 yo female admitted to ED on 01/10/19 with CAP. Pt with history of FTT, afib, acute gastritis w/o hemorrhage, anxiety, OA, CAP, Crohn disease, depression, DVT and PE, pancreatic cysts, RLS, DMII, dementia, CKD III, peripheral neuropathy, deconditioning.     PT Comments    Progressing slowly with mobility. Remained on [place] O2-2L during session. Pt fatigues easily with activity. Seated rest breaks needed/taken between short walks. Will continue to follow.    Follow Up Recommendations  SNF;Supervision/Assistance - 24 hour(unless pt can arrange for increased assistance at home. )     Equipment Recommendations  None recommended by PT    Recommendations for Other Services       Precautions / Restrictions Precautions Precautions: Fall Restrictions Weight Bearing Restrictions: No    Mobility  Bed Mobility Overal bed mobility: Needs Assistance Bed Mobility: Sit to Supine     Supine to sit: Min assist Sit to supine: Min assist   General bed mobility comments: Assist for LEs back onto bed. Increased time.   Transfers Overall transfer level: Needs assistance Equipment used: 4-wheeled walker Transfers: Sit to/from Stand Sit to Stand: Min assist Stand pivot transfers: Min assist       General transfer comment: x2. VCs safety, technique, hand placement, proper operation of rollator. Assist to rise, stabilize, control descent.   Ambulation/Gait Ambulation/Gait assistance: Min assist Gait Distance (Feet): 25 Feet(25'x1, 10'x1) Assistive device: 4-wheeled walker Gait Pattern/deviations: Step-through pattern;Decreased stride length     General Gait Details: Assist to stabilize pt throughout short distance. Remained on [place] O2-2L. Dyspnea 2/3. Pt fatigues easily.    Stairs             Wheelchair Mobility    Modified  [place] (Stroke Patients Only)       Balance Overall balance assessment: Needs assistance         Standing balance support: Bilateral upper extremity supported Standing balance-Leahy Scale: Poor                              Cognition Arousal/Alertness: Awake/alert Behavior During Therapy: WFL for tasks assessed/performed Overall Cognitive Status: History of cognitive impairments - at baseline                                 General Comments: Per chart review, pt with dementia but no signs of cognitive impairment during session. Pt A&O x3, very kind and answers all questions appropriately.       Exercises      General Comments        Pertinent Vitals/Pain Pain Assessment: No/denies pain    Home Living                      Prior Function            PT Goals (current goals can now be found in the care plan section) Progress towards PT goals: Progressing toward goals    Frequency    Min 3X/week      PT Plan Frequency needs to be updated    Co-evaluation              AM-PAC PT "6 Clicks" Mobility   Outcome Measure  Help needed turning from your back to  your side while in a flat bed without using bedrails?: A Little Help needed moving from lying on your back to sitting on the side of a flat bed without using bedrails?: A Little Help needed moving to and from a bed to a chair (including a wheelchair)?: A Little Help needed standing up from a chair using your arms (e.g., wheelchair or bedside chair)?: A Little Help needed to walk in hospital room?: A Little Help needed climbing 3-5 steps with a railing? : A Lot 6 Click Score: 17    End of Session Equipment Utilized During Treatment: Gait belt;Oxygen Activity Tolerance: Patient limited by fatigue Patient left: in bed;with call bell/phone within reach;with bed alarm set   PT Visit Diagnosis: Unsteadiness on feet (R26.81);Muscle weakness (generalized)  (M62.81);Difficulty in walking, not elsewhere classified (R26.2)     Time: 1433-1500 PT Time Calculation (min) (ACUTE ONLY): 27 min  Charges:  $Gait Training: 23-37 mins                        Weston Anna, PT Acute Rehabilitation Services Pager: [phone] Office: [phone]

## 2019-01-13 NOTE — Progress Notes (Signed)
Occupational Therapy Treatment Patient Details Name: [name] MRN: [id number] DOB: Feb 16, 1919 Today's Date: 01/13/2019    History of present illness 85 yo female admitted to ED on 01/10/19 with CAP. Pt with history of FTT, afib, acute gastritis w/o hemorrhage, anxiety, OA, CAP, Crohn disease, depression, DVT and PE, pancreatic cysts, RLS, DMII, dementia, CKD III, peripheral neuropathy, deconditioning.    OT comments  Pt may need ST SNF for increased care unless pts family/ hospice can provide increased care  Follow Up Recommendations  Home health OT;Supervision/Assistance - 24 hour;SNF(pt may need increased A at home.  )    Equipment Recommendations  3 in 1 bedside commode    Recommendations for Other Services      Precautions / Restrictions Precautions Precautions: Fall       Mobility Bed Mobility Overal bed mobility: Needs Assistance Bed Mobility: Supine to Sit     Supine to sit: Min assist        Transfers Overall transfer level: Needs assistance Equipment used: Rolling walker (2 wheeled) Transfers: Sit to/from [facility] Sit to Stand: Min assist Stand pivot transfers: Min assist                ADL either performed or assessed with clinical judgement   ADL Overall ADL's : Needs assistance/impaired Eating/Feeding: Set up;Sitting   Grooming: Standing;Wash/dry face;Set up                   Toilet Transfer: Minimal assistance;RW;Stand-pivot;BSC   Toileting- Clothing Manipulation and Hygiene: Minimal assistance;Sit to/from stand;Cueing for sequencing;Cueing for safety       Functional mobility during ADLs: Minimal assistance;Rolling walker;Cueing for sequencing       Vision Patient Visual Report: No change from baseline            Cognition Arousal/Alertness: Awake/alert Behavior During Therapy: WFL for tasks assessed/performed Overall Cognitive Status: Within Functional Limits for tasks assessed                                 General Comments: Per chart review, pt with dementia but no signs of cognitive impairment during session. Pt A&O x3, very kind and answers all questions appropriately.                    Pertinent Vitals/ Pain       Pain Assessment: No/denies pain         Frequency  Min 2X/week        Progress Toward Goals  OT Goals(current goals can now be found in the care plan section)  Progress towards OT goals: Progressing toward goals     Plan Discharge plan needs to be updated       AM-PAC OT "6 Clicks" Daily Activity     Outcome Measure   Help from another person eating meals?: A Little Help from another person taking care of personal grooming?: A Little Help from another person toileting, which includes using toliet, bedpan, or urinal?: A Little Help from another person bathing (including washing, rinsing, drying)?: A Little Help from another person to put on and taking off regular upper body clothing?: A Little Help from another person to put on and taking off regular lower body clothing?: A Little 6 Click Score: 18    End of Session Equipment Utilized During Treatment: Rolling walker  OT Visit Diagnosis: Unsteadiness on feet (R26.81);Other abnormalities of gait and mobility (  R26.89);Muscle weakness (generalized) (M62.81)   Activity Tolerance Patient tolerated treatment well   Patient Left in chair;with call bell/phone within reach;with chair alarm set   Nurse Communication Mobility status        Time: 2330-0762 OT Time Calculation (min): 21 min  Charges: OT General Charges $OT Visit: 1 Visit OT Treatments $Self Care/Home Management : 8-22 mins  Kari Baars, [place] [phone] Office- [place], [name] 01/13/2019, 1:29 PM

## 2019-01-14 LAB — BASIC METABOLIC PANEL
Anion gap: 9 (ref 5–15)
BUN: 17 mg/dL (ref 8–23)
CO2: 23 mmol/L (ref 22–32)
Calcium: 8.5 mg/dL — ABNORMAL LOW (ref 8.9–10.3)
Chloride: 113 mmol/L — ABNORMAL HIGH (ref 98–111)
Creatinine, Ser: 1.1 mg/dL — ABNORMAL HIGH (ref 0.44–1.00)
GFR calc Af Amer: 49 mL/min — ABNORMAL LOW (ref >60)
GFR, EST NON AFRICAN AMERICAN: 42 mL/min — AB (ref >60)
Glucose, Bld: 98 mg/dL (ref 70–99)
Potassium: 5.1 mmol/L (ref 3.5–5.1)
Sodium: 145 mmol/L (ref 135–145)

## 2019-01-14 MED ORDER — IPRATROPIUM BROMIDE 0.02 % IN SOLN: 0.5000 mg | Freq: Four times a day (QID) | RESPIRATORY_TRACT | 12 refills | Status: DC | PRN

## 2019-01-14 MED ORDER — LEVALBUTEROL HCL 0.63 MG/3ML IN NEBU: 0.6300 mg | INHALATION_SOLUTION | Freq: Four times a day (QID) | RESPIRATORY_TRACT | 12 refills | Status: DC | PRN

## 2019-01-14 MED ORDER — DOXYCYCLINE HYCLATE 100 MG PO TABS: 100.0000 mg | ORAL_TABLET | Freq: Two times a day (BID) | ORAL | 0 refills | Status: AC

## 2019-01-14 MED ORDER — GUAIFENESIN-DM 100-10 MG/5ML PO SYRP: 5.0000 mL | ORAL_SOLUTION | ORAL | 0 refills | Status: AC | PRN

## 2019-01-14 MED ORDER — GUAIFENESIN ER 600 MG PO TB12: 600.0000 mg | ORAL_TABLET | Freq: Two times a day (BID) | ORAL | 0 refills | Status: DC

## 2019-01-14 NOTE — Discharge Summary (Signed)
Physician Discharge Summary  [name] YNW:[id number] DOB: 1919-01-30 DOA: 01/10/2019  PCP: Briscoe Deutscher, DO  Admit date: 01/10/2019 Discharge date: 01/14/2019  Admitted From:HOME  Disposition:  [facility].   Recommendations for Outpatient Follow-up:  1. Follow up with PCP in 1-2 weeks 2. Please follow up with Hospice MD.   Equipment/Devices:2 lit of [place] oxygen.   Discharge Condition:HOSPICE CODE STATUS:DNR Diet recommendation: DYSPHAGIA 3 DIET.   Brief/Interim Summary: 85 y.o.femalewith past medical history significant for A. fib, Crohn's disease, diabetes, on hospice care who presents to the emergency department complaining of shortness of breath, cough that is started 2 days prior to admission.   Discharge Diagnoses:  Principal Problem:   Community acquired pneumonia Active Problems:   Benign essential hypertension   GERD (gastroesophageal reflux disease)   Anemia of chronic disease   Type 2 diabetes mellitus with stage 3 chronic kidney disease (HCC)   CKD stage 4 due to type 2 diabetes mellitus ([place])   DNR (do not resuscitate)   Dementia without behavioral disturbance ([place])   Pneumonia   Pressure injury of skin  RML CAP: Patient presented with cough, fever, chest x-ray with pulmonary infiltrates, tachypnea,tachycardic. - Pt is continued onceftriaxone and azithromycin. DAY 5 OF treatment today. Plan for another 2 days of doxycycline on discharge.   - Nebulizer treatment, Tussionex for cough. - SLP evaluation > Dys 3 diet. Poor dentition puts at risk with denser foods. Would benefit from compensation strategies, though will allow eating without restrictions given GOC is comfort.  Acute hypoxic respiratory failure: Expect protracted recovery for this 85yo F with pneumonia.  - Continues to require 2L by . Wean O2 as tolerated.  Sinus tachycardia; - Improved with IVF hydration  Hypokalemia: Resolved.  T2DM with hypoglycemia:  - DC SSI, check CBGs until  sure no longer hypoglycemic.   Stage I coccyx pressure injury, POA:  - Offload as able - Optimize nutritional status  Acute delirium:  Resolved.    Discharge Instructions  Discharge Instructions    Diet - low sodium heart healthy   Complete by:  As directed    Discharge instructions   Complete by:  As directed    Follow up with Home Hospice as recommended.  Please follow strict Dysphagia 3 diet recommendations.     Allergies as of 01/14/2019      Reactions   Sulfa Antibiotics Nausea And Vomiting   Penicillins Hives, Rash   ++tolerates Ceftriaxone and keflex++ Has patient had a PCN reaction causing immediate rash, facial/tongue/throat swelling, SOB or lightheadedness with hypotension: YES Has patient had a PCN reaction causing severe rash involving mucus membranes or skin necrosis: Unknown Has patient had a PCN reaction that required hospitalization Unknown Has patient had a PCN reaction occurring within the last 10 years: No If all of the above answers are "NO", then may proceed with Cephalosporin use.      Medication List    STOP taking these medications   mirtazapine 15 MG tablet Commonly known as:  REMERON     TAKE these medications   diclofenac sodium 1 % Gel Commonly known as:  VOLTAREN Apply 4 g topically 4 (four) times daily as needed.   doxycycline 100 MG tablet Commonly known as:  VIBRA-TABS Take 1 tablet (100 mg total) by mouth 2 (two) times daily for 2 days. Start taking on:  January 15, 2019   guaiFENesin 600 MG 12 hr tablet Commonly known as:  MUCINEX Take 1 tablet (600 mg total) by  mouth 2 (two) times daily.   guaiFENesin-dextromethorphan 100-10 MG/5ML syrup Commonly known as:  ROBITUSSIN DM Take 5 mLs by mouth every 4 (four) hours as needed for cough.   ipratropium 0.02 % nebulizer solution Commonly known as:  ATROVENT Take 2.5 mLs (0.5 mg total) by nebulization every 6 (six) hours as needed for wheezing or shortness of breath.    levalbuterol 0.63 MG/3ML nebulizer solution Commonly known as:  XOPENEX Take 3 mLs (0.63 mg total) by nebulization every 6 (six) hours as needed for wheezing or shortness of breath.   lidocaine 5 % Commonly known as:  LIDODERM PLACE 1 PATCH ONTO AFFECTED AREA(S) EVERY 12 HOURS FOR PAIN RELIEF   pantoprazole 40 MG tablet Commonly known as:  PROTONIX Take 1 tablet (40 mg total) by mouth daily.   polyvinyl alcohol 1.4 % ophthalmic solution Commonly known as:  LIQUIFILM TEARS Place 1 drop into both eyes as needed for dry eyes.   ropinirole 5 MG tablet Commonly known as:  REQUIP TAKE ONE TABLET BY MOUTH EVERY NIGHT AT BEDTIME   triamcinolone ointment 0.5 % Commonly known as:  KENALOG Apply 1 application topically 2 (two) times daily as needed (irritation,rash).            Durable Medical Equipment  (From admission, onward)         Start     Ordered   01/14/19 1049  For home use only DME oxygen  Once    Question Answer Comment  Mode or (Route) Nasal cannula   Liters per Minute 2   Frequency Continuous (stationary and portable oxygen unit needed)   Oxygen conserving device Yes   Oxygen delivery system Gas      01/14/19 1048         Follow-up Information    [facility] Follow up.   Why:  home 02,home nurse visit Contact information: [place] [phone]         Allergies  Allergen Reactions  . Sulfa Antibiotics Nausea And Vomiting  . Penicillins Hives and Rash    ++tolerates Ceftriaxone and keflex++ Has patient had a PCN reaction causing immediate rash, facial/tongue/throat swelling, SOB or lightheadedness with hypotension: YES Has patient had a PCN reaction causing severe rash involving mucus membranes or skin necrosis: Unknown Has patient had a PCN reaction that required hospitalization Unknown Has patient had a PCN reaction occurring within the last 10 years: No If all of the above  answers are "NO", then may proceed with Cephalosporin use.     Consultations:  None.    Procedures/Studies: Dg Chest Port 1 View  Result Date: 01/10/2019 CLINICAL DATA:  Respiratory distress. EXAM: PORTABLE CHEST 1 VIEW COMPARISON:  Chest x-ray dated November 01, 2017. FINDINGS: The heart size and mediastinal contours are within normal limits. Normal pulmonary vascularity. Atherosclerotic calcification of the aortic arch. Unchanged severe calcifications of the mitral annulus. Increased density in the right mid lung. No pleural effusion or pneumothorax. No acute osseous abnormality. IMPRESSION: 1. Increased opacity in right mid lung, suspicious for pneumonia. Followup PA and lateral chest X-ray is recommended in 3-4 weeks following trial of antibiotic therapy to ensure resolution. Electronically Signed   By: Titus Dubin M.D.   On: 01/10/2019 13:57       Subjective: No chest pain or sob.   Discharge Exam: Vitals:   01/14/19 1111 01/14/19 1315  BP: (!) 155/68 (!) 149/71  Pulse: 71 73  Resp:  17  Temp:  98.4 F (36.9 C)  SpO2: 98% 99%   Vitals:   01/14/19 0519 01/14/19 1023 01/14/19 1111 01/14/19 1315  BP: (!) 175/85  (!) 155/68 (!) 149/71  Pulse: 78  71 73  Resp: 18   17  Temp: 97.6 F (36.4 C)   98.4 F (36.9 C)  TempSrc: Oral   Oral  SpO2: 98% 98% 98% 99%  Weight:      Height:        General: Pt is alert, awake, not in acute distress on 2lit of [place] oxygen Cardiovascular: s1s2, irregular.  Respiratory: air entry fair bilaterally.  Abdominal: Soft, NT, ND, bowel sounds + Extremities: no edema, no cyanosis    The results of significant diagnostics from this hospitalization (including imaging, microbiology, ancillary and laboratory) are listed below for reference.     Microbiology: Recent Results (from the past 240 hour(s))  MRSA PCR Screening     Status: None   Collection Time: 01/10/19  6:02 PM  Result Value Ref Range Status   MRSA by PCR NEGATIVE NEGATIVE  Final    Comment:        The GeneXpert MRSA Assay (FDA approved for NASAL specimens only), is one component of a comprehensive MRSA colonization surveillance program. It is not intended to diagnose MRSA infection nor to guide or monitor treatment for MRSA infections. Performed at [facility], [place] [address], [place], [place] [address]      Labs: BNP (last 3 results) Recent Labs    01/10/19 1329  BNP 127.5*   Basic Metabolic Panel: Recent Labs  Lab 01/10/19 1441 01/11/19 0550 01/12/19 0559 01/14/19 0601  NA 137 137 140 145  K 3.2* 4.1 4.2 5.1  CL 103 107 109 113*  CO2 23 23 23 23   GLUCOSE 233* 132* 79 98  BUN 22 22 16 17   CREATININE 1.21* 1.10* 0.93 1.10*  CALCIUM 8.6* 8.4* 8.9 8.5*   Liver Function Tests: Recent Labs  Lab 01/10/19 1441  AST 24  ALT 12  ALKPHOS 136*  BILITOT 0.3  PROT 7.0  ALBUMIN 3.4*   No results for input(s): LIPASE, AMYLASE in the last 168 hours. No results for input(s): AMMONIA in the last 168 hours. CBC: Recent Labs  Lab 01/10/19 1329 01/11/19 0550  WBC 8.7 6.1  NEUTROABS 6.0  --   HGB 11.9* 9.8*  HCT 39.3 32.2*  MCV 98.5 98.8  PLT 199 172   Cardiac Enzymes: No results for input(s): CKTOTAL, CKMB, CKMBINDEX, TROPONINI in the last 168 hours. BNP: Invalid input(s): POCBNP CBG: Recent Labs  Lab 01/12/19 0815 01/12/19 0858 01/12/19 0937 01/12/19 1221 01/12/19 1659  GLUCAP 64* 56* 87 78 95   D-Dimer No results for input(s): DDIMER in the last 72 hours. Hgb A1c No results for input(s): HGBA1C in the last 72 hours. Lipid Profile No results for input(s): CHOL, HDL, LDLCALC, TRIG, CHOLHDL, LDLDIRECT in the last 72 hours. Thyroid function studies No results for input(s): TSH, T4TOTAL, T3FREE, THYROIDAB in the last 72 hours.  Invalid input(s): FREET3 Anemia work up No results for input(s): VITAMINB12, FOLATE, FERRITIN, TIBC, IRON, RETICCTPCT in the last 72 hours. Urinalysis    Component Value  Date/Time   COLORURINE YELLOW 05/17/2018 1429   APPEARANCEUR CLEAR 05/17/2018 1429   LABSPEC 1.015 05/17/2018 1429   PHURINE 5.5 05/17/2018 1429   GLUCOSEU NEGATIVE 05/17/2018 1429   HGBUR NEGATIVE 05/17/2018 1429   BILIRUBINUR NEGATIVE 05/17/2018 [place] 05/17/2018 1429  PROTEINUR NEGATIVE 12/24/2017 2146   UROBILINOGEN 0.2 05/17/2018 1429   NITRITE NEGATIVE 05/17/2018 1429   LEUKOCYTESUR TRACE (A) 05/17/2018 1429   Sepsis Labs Invalid input(s): PROCALCITONIN,  WBC,  LACTICIDVEN Microbiology Recent Results (from the past 240 hour(s))  MRSA PCR Screening     Status: None   Collection Time: 01/10/19  6:02 PM  Result Value Ref Range Status   MRSA by PCR NEGATIVE NEGATIVE Final    Comment:        The GeneXpert MRSA Assay (FDA approved for NASAL specimens only), is one component of a comprehensive MRSA colonization surveillance program. It is not intended to diagnose MRSA infection nor to guide or monitor treatment for MRSA infections. Performed at [facility], [place] [address], [place], [place] [address]      Time coordinating discharge: 32  minutes  SIGNED:   Hosie Poisson, MD  [facility] 01/14/2019, 2:52 PM Pager   If 7PM-7AM, please contact night-coverage [email or web address] Password [email or web address]

## 2019-01-14 NOTE — Progress Notes (Signed)
[facility] ([facility] GIP RN Note  [facility] RN made home visit yesterday due to pt being SOB. RN arrives, finds pt agitated and restless, RR 34, SPO2 in the 80s, adventitious breath sounds, decision was made to transfer pt to the ED for further evaluation. Pt has a [facility] diagnosis of protein calorie malnutrition per Dr. Lyman Speller. This is a related and covered GIP admission with a diagnosis of SOB and cough.  Visited patient at bedside. She was sleeping and I did not wake her. She is continues to be on O2 2Lnc. It is noted in chart that she complained of increased shortness of breath on 01/13/2019 and received a breathing treatment and IV lasix. IV fluids were discontinued.   Plan is for patient to discharge today. Oxygen has been ordered, including a portable take to be delivered to hospital room.   IDT: Updated  PCG: Updated Alla German by phone  Patient will transport home via private vehicle with family friend, Lang Snow.  Please call with any hospice related questions or concerns.  Farrel Gordon, RN, [facility] Liaison (listed on [place])  [phone]

## 2019-01-14 NOTE — Care Management Note (Signed)
Case Management Note  Patient Details  Name: [name] MRN: [id number] Date of Birth: 02/06/1919  Subjective/Objective: Confirmed w/[facility] liason MaryAnn-[facility] will manage delivery of home 02.Patient will return home w/caregivers-Don Causey-will pick patient up in their own vehicle once dme-home 02 delivered to home likely after 2p pick up-[facility] home 02 travel tank to be delivered to rm prior d/c.DNR out of facility DNR in shadow chart.No further CM needs.                   Action/Plan:dc home w/[facility]/dme-home 02/travel tank.   Expected Discharge Date:  01/14/19               Expected Discharge Plan:  Home w Hospice Care  In-House Referral:     Discharge planning Services  CM Consult  Post Acute Care Choice:  Home Health(Active w/[facility]) Choice offered to:     DME Arranged:    DME Agency:     HH Arranged:  RN [employer] Agency:     Status of Service:  Completed, signed off  If discussed at [place] of Stay Meetings, dates discussed:    Additional Comments:  Dessa Phi, RN 01/14/2019, 11:20 AM

## 2019-01-14 NOTE — Progress Notes (Signed)
Received call from [place] delivery of home 02 between 3-7p-Don will call the floor once dme delivered to home,then he will pick patient up for d/c.Nsg updated.

## 2019-01-14 NOTE — Progress Notes (Signed)
Physical Therapy Treatment Patient Details Name: [name] MRN: [id number] DOB: 25-Sep-1919 Today's Date: 01/14/2019    History of Present Illness 85 yo female admitted to ED on 01/10/19 with CAP. Pt with history of FTT, afib, acute gastritis w/o hemorrhage, anxiety, OA, CAP, Crohn disease, depression, DVT and PE, pancreatic cysts, RLS, DMII, dementia, CKD III, peripheral neuropathy, deconditioning.     PT Comments    Pt reported not feeling well on today. She was agreeable to get OOB and to the recliner. No family present during session. Per chart, plan is for pt to return home with hospice care.   Follow Up Recommendations  Supervision/Assistance - 24 hour (per chart, pt will return home with hospice care)     Equipment Recommendations  None recommended by PT    Recommendations for Other Services       Precautions / Restrictions Precautions Precautions: Fall Restrictions Weight Bearing Restrictions: No    Mobility  Bed Mobility Overal bed mobility: Needs Assistance Bed Mobility: Supine to Sit     Supine to sit: Min assist     General bed mobility comments: Assist to get to EOB. Increased time.   Transfers Overall transfer level: Needs assistance Equipment used: 4-wheeled walker Transfers: Sit to/from Stand Sit to Stand: Min assist Stand pivot transfers: Min assist       General transfer comment: Sit to stand x 3. Stand pivot, bed to recliner, using rollator. Assist to steady pt and maneuver walker. Remained on [place] O2.   Ambulation/Gait   Gait Distance (Feet): 3 Feet Assistive device: 4-wheeled walker       General Gait Details: Assist to steady pt and maneuver walker. Remained on [place] O2-2L. Coughing noted.    Stairs             Wheelchair Mobility    Modified Rankin (Stroke Patients Only)       Balance Overall balance assessment: Needs assistance         Standing balance support: Bilateral upper extremity supported Standing  balance-Leahy Scale: Poor                              Cognition Arousal/Alertness: Awake/alert Behavior During Therapy: WFL for tasks assessed/performed Overall Cognitive Status: History of cognitive impairments - at baseline                                 General Comments: Per chart review, pt with dementia but no signs of cognitive impairment during session. Pt A&O x3, very kind and answers all questions appropriately.       Exercises      General Comments        Pertinent Vitals/Pain Pain Assessment: No/denies pain    Home Living                      Prior Function            PT Goals (current goals can now be found in the care plan section) Progress towards PT goals: Progressing toward goals    Frequency    Min 3X/week      PT Plan Current plan remains appropriate    Co-evaluation              AM-PAC PT "6 Clicks" Mobility   Outcome Measure  Help needed turning from your back to your  side while in a flat bed without using bedrails?: A Little Help needed moving from lying on your back to sitting on the side of a flat bed without using bedrails?: A Little Help needed moving to and from a bed to a chair (including a wheelchair)?: A Little Help needed standing up from a chair using your arms (e.g., wheelchair or bedside chair)?: A Little Help needed to walk in hospital room?: A Little Help needed climbing 3-5 steps with a railing? : A Lot 6 Click Score: 17    End of Session Equipment Utilized During Treatment: Oxygen Activity Tolerance: Patient limited by fatigue Patient left: in chair;with call bell/phone within reach;with chair alarm set   PT Visit Diagnosis: Unsteadiness on feet (R26.81);Muscle weakness (generalized) (M62.81);Difficulty in walking, not elsewhere classified (R26.2)     Time: 6834-1962 PT Time Calculation (min) (ACUTE ONLY): 26 min  Charges:  $Therapeutic Activity: 23-37 mins                         Weston Anna, PT Acute Rehabilitation Services Pager: [phone] Office: [phone]

## 2019-01-17 ENCOUNTER — Emergency Department (HOSPITAL_COMMUNITY)
Admission: EM | Admit: 2019-01-17 | Discharge: 2019-01-17 | Disposition: A | Discharge status: Home / Self Care | Attending: Emergency Medicine | Admitting: Emergency Medicine

## 2019-01-17 ENCOUNTER — Encounter (HOSPITAL_COMMUNITY): Payer: Self-pay | Admitting: Emergency Medicine

## 2019-01-17 ENCOUNTER — Emergency Department (HOSPITAL_COMMUNITY)

## 2019-01-17 DIAGNOSIS — E1122 Type 2 diabetes mellitus with diabetic chronic kidney disease: Secondary | ICD-10-CM | POA: Diagnosis not present

## 2019-01-17 DIAGNOSIS — N183 Chronic kidney disease, stage 3 (moderate): Secondary | ICD-10-CM | POA: Diagnosis not present

## 2019-01-17 DIAGNOSIS — N289 Disorder of kidney and ureter, unspecified: Secondary | ICD-10-CM | POA: Insufficient documentation

## 2019-01-17 DIAGNOSIS — Z79899 Other long term (current) drug therapy: Secondary | ICD-10-CM | POA: Diagnosis not present

## 2019-01-17 DIAGNOSIS — D649 Anemia, unspecified: Secondary | ICD-10-CM | POA: Insufficient documentation

## 2019-01-17 DIAGNOSIS — E039 Hypothyroidism, unspecified: Secondary | ICD-10-CM | POA: Insufficient documentation

## 2019-01-17 DIAGNOSIS — F0390 Unspecified dementia without behavioral disturbance: Secondary | ICD-10-CM | POA: Diagnosis not present

## 2019-01-17 DIAGNOSIS — R0602 Shortness of breath: Secondary | ICD-10-CM

## 2019-01-17 DIAGNOSIS — I129 Hypertensive chronic kidney disease with stage 1 through stage 4 chronic kidney disease, or unspecified chronic kidney disease: Secondary | ICD-10-CM | POA: Diagnosis not present

## 2019-01-17 DIAGNOSIS — J209 Acute bronchitis, unspecified: Secondary | ICD-10-CM | POA: Insufficient documentation

## 2019-01-17 LAB — CBC WITH DIFFERENTIAL/PLATELET
Abs Immature Granulocytes: 0.03 10*3/uL (ref 0.00–0.07)
Basophils Absolute: 0 10*3/uL (ref 0.0–0.1)
Basophils Relative: 1 %
EOS ABS: 0.1 10*3/uL (ref 0.0–0.5)
Eosinophils Relative: 2 %
HCT: 35.5 % — ABNORMAL LOW (ref 36.0–46.0)
Hemoglobin: 10.7 g/dL — ABNORMAL LOW (ref 12.0–15.0)
Immature Granulocytes: 1 %
Lymphocytes Relative: 35 %
Lymphs Abs: 1.9 10*3/uL (ref 0.7–4.0)
MCH: 29.6 pg (ref 26.0–34.0)
MCHC: 30.1 g/dL (ref 30.0–36.0)
MCV: 98.1 fL (ref 80.0–100.0)
Monocytes Absolute: 0.4 10*3/uL (ref 0.1–1.0)
Monocytes Relative: 7 %
Neutro Abs: 2.9 10*3/uL (ref 1.7–7.7)
Neutrophils Relative %: 54 %
Platelets: 224 10*3/uL (ref 150–400)
RBC: 3.62 MIL/uL — ABNORMAL LOW (ref 3.87–5.11)
RDW: 13.5 % (ref 11.5–15.5)
WBC: 5.3 10*3/uL (ref 4.0–10.5)
nRBC: 0 % (ref 0.0–0.2)

## 2019-01-17 LAB — BASIC METABOLIC PANEL
ANION GAP: 10 (ref 5–15)
BUN: 22 mg/dL (ref 8–23)
CO2: 27 mmol/L (ref 22–32)
Calcium: 8.7 mg/dL — ABNORMAL LOW (ref 8.9–10.3)
Chloride: 104 mmol/L (ref 98–111)
Creatinine, Ser: 1.18 mg/dL — ABNORMAL HIGH (ref 0.44–1.00)
GFR calc Af Amer: 45 mL/min — ABNORMAL LOW (ref >60)
GFR calc non Af Amer: 39 mL/min — ABNORMAL LOW (ref >60)
Glucose, Bld: 131 mg/dL — ABNORMAL HIGH (ref 70–99)
Potassium: 4.2 mmol/L (ref 3.5–5.1)
Sodium: 141 mmol/L (ref 135–145)

## 2019-01-17 LAB — LACTIC ACID, PLASMA: Lactic Acid, Venous: 1.5 mmol/L (ref 0.5–1.9)

## 2019-01-17 MED ORDER — IPRATROPIUM-ALBUTEROL 0.5-2.5 (3) MG/3ML IN SOLN
3.0000 mL | Freq: Once | RESPIRATORY_TRACT | Status: AC
  Administered 2019-01-17: 3 mL via RESPIRATORY_TRACT
  Filled 2019-01-17: qty 3

## 2019-01-17 MED ORDER — ALBUTEROL SULFATE HFA 108 (90 BASE) MCG/ACT IN AERS
2.0000 | INHALATION_SPRAY | RESPIRATORY_TRACT | Status: DC | PRN
  Administered 2019-01-17: 2 via RESPIRATORY_TRACT
  Filled 2019-01-17: qty 6.7

## 2019-01-17 MED ORDER — DEXAMETHASONE 4 MG PO TABS
10.0000 mg | ORAL_TABLET | Freq: Once | ORAL | Status: AC
  Administered 2019-01-17: 10 mg via ORAL
  Filled 2019-01-17: qty 2

## 2019-01-17 NOTE — Progress Notes (Addendum)
[facility]  Visited with pt at bedside in ED.  Spoke with [facility] Marcie Bal (Don's wife), they are in the process of arranging 24/7 private sitters.    Please call Marcie Bal [phone]) to confirm d/c plans as Timmothy Sours is unable to be reached part of the day today.  Marcie Bal will plan to meet Mrs. Avakian at her home once ambulance transportation has been arranged, please call Marcie Bal to let her know once [facility] has been contacted for transportation.  Marcie Bal states she did not have an incentive spirometer sent home with her upon her last discharge, pt confirms this.  Please give pt an IS to assist with her breathing at home.  Please call with any hospice related questions or concerns  Venia Carbon BSN, RN [facility] Liaison (direct dial in [place]) [phone]

## 2019-01-17 NOTE — ED Notes (Addendum)
Patient's friend, Lang Snow [phone] was called per pt request to help her get back into her house. No answer, message was left. Will call again and continue to look for family/neighbor to help pt get back into her house.

## 2019-01-17 NOTE — ED Notes (Signed)
Patient given incentive spirometer per hospice nurse's notes. Pt instructed on how to use and demonstrated use for Probation officer.

## 2019-01-17 NOTE — Discharge Instructions (Addendum)
Use the inhaler - two puffs, every four hours, as needed for coughing or shortness of breath.  Return if symptoms are getting worse.

## 2019-01-17 NOTE — ED Notes (Signed)
[facility] called for transportation.  Marcie Bal called to notify pt will be brought home.  [facility] is here to evaluate pt. For 24hr care.  Will notify Marcie Bal when pt leaves with [facility]

## 2019-01-17 NOTE — ED Notes (Signed)
Bed: WA21 Expected date:  Expected time:  Means of arrival:  Comments: EMS 85 y/o SOB

## 2019-01-17 NOTE — Progress Notes (Signed)
[facility]  Please use [facility] for transport back home, if ambulance transport is needed.   Thank you, Venia Carbon BSN, [facility] Liaison (listed in [place]) [phone]

## 2019-01-17 NOTE — ED Triage Notes (Signed)
Per EMS , pt. Fr om home with complaint of SOB started last night. Pt. Was seen here last feb 3-7 for pneumonia. A hospice pt. With a  DNR form . A/O x2. Denied fever at home.

## 2019-01-17 NOTE — ED Provider Notes (Signed)
[facility] DEPT Provider Note   CSN: [id number] Arrival date & time: 01/17/19  0138     History   Chief Complaint Chief Complaint  Patient presents with  . Shortness of Breath    HPI [name] is a 85 y.o. female.  The history is provided by the EMS personnel. The history is limited by the condition of the patient (Dementia).  She has history of pulmonary embolism, hypothyroidism, community-acquired pneumonia, hyperlipidemia, diabetes, hypertension, Crohn's disease, and was brought in by ambulance because of shortness of breath.  She had been hospitalized through February 7 for community-acquired pneumonia.  She denies fever or cough.  She denies chest pain.  Past Medical History:  Diagnosis Date  . Acute gastritis without mention of hemorrhage   . Anemia   . Anxiety   . Arthritis    "LLL; palm of right hand" (06/12/2015)  . Atrial fibrillation ([place])   . CAP (community acquired pneumonia) 04/03/2014  . Crohn disease ([place])   . Cyst and pseudocyst of pancreas   . Depression   . Diaphragmatic hernia without mention of obstruction or gangrene   . Diverticulosis of colon (without mention of hemorrhage)   . Duodenitis without mention of hemorrhage   . DVT (deep vein thrombosis) in pregnancy 11/22/2017  . DVT (deep venous thrombosis) ([place])    "twice on the left leg; once on the right leg" (06/12/2015)  . Dysrhythmia   . Esophageal reflux   . Hiatal hernia- moderate to large 10/06/2014  . Hx pulmonary embolism   . Hypothyroidism   . Insomnia, unspecified   . Left leg DVT ([place]) jan 2013  . Migraine    hx  . OAB (overactive bladder)   . Other and unspecified hyperlipidemia   . Other malaise and fatigue   . Pancreas divisum 03/02/2015  . Pancreatic cyst   . Personal history of unspecified digestive disease   . Polymyalgia rheumatica ([place])   . Protein-calorie malnutrition, severe ([place]) 10/02/2014  . Restless legs syndrome (RLS)   .  Shingles   . Stricture and stenosis of esophagus   . Type II diabetes mellitus ([place])   . Unspecified essential hypertension   . Unspecified sleep apnea    hx of sleep apnea, none since weight loss (06/12/2015)  . Urinary frequency   . Urinary urgency     Patient Active Problem List   Diagnosis Date Noted  . Pneumonia 01/10/2019  . Pressure injury of skin 01/10/2019  . Dementia without behavioral disturbance ([place]) 12/27/2018  . Anorexia 12/27/2018  . Recurrent deep vein thrombosis (DVT) ([place]) 12/27/2018  . Peripheral edema 12/27/2018  . Hospice care patient 10/24/2018  . Auditory hallucinations 12/25/2017  . Altered mental status   . DNR (do not resuscitate) 12/12/2017  . CKD (chronic kidney disease), stage III ([place]) 11/22/2017  . Hypertension 11/22/2017  . Atrial fibrillation, chronic 11/22/2017  . Depression 11/22/2017  . Protein-calorie malnutrition, moderate ([place]) 11/22/2017  . Dry skin dermatitis 09/24/2017  . Osteoarthritis 07/31/2017  . CKD stage 4 due to type 2 diabetes mellitus ([place]) 04/19/2017  . Iron deficiency anemia 04/19/2017  . B12 deficiency 04/08/2017  . Chronic constipation 04/08/2017  . Chronic deep vein thrombosis (DVT) of left lower extremity ([place]) 04/08/2017  . Monoclonal gammopathy of unknown significance (MGUS) 04/08/2017  . Peripheral neuropathy 04/08/2017  . Rectal mass   . Pancreas divisum 03/02/2015  . Physical deconditioning 03/01/2015  . Pancreatic cyst   . Hiatal hernia- moderate to  large 10/06/2014  . Heme + stool 10/04/2014  . Crohn's ileocolitis ([place]) 10/04/2014  . 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 Arthritis   . Hx pulmonary embolism   . Hypothyroidism 04/11/2014  . Type 2 diabetes mellitus with stage 3 chronic kidney disease ([place]) 04/11/2014  . Community acquired pneumonia 04/03/2014  . Anemia of chronic disease 04/03/2014  . OAB (overactive bladder) 01/07/2012  . Restless leg syndrome 03/09/2008  . Benign essential hypertension 03/09/2008   . ESOPHAGEAL STRICTURE 03/09/2008  . GERD (gastroesophageal reflux disease) 03/09/2008  . Diverticulosis of large intestine 03/09/2008  . Polymyalgia rheumatica ([place]) 03/09/2008    Past Surgical History:  Procedure Laterality Date  . BACK SURGERY    . CATARACT EXTRACTION, BILATERAL Bilateral   . CLOSED REDUCTION NASAL FRACTURE  08/2008   Archie Endo 04/10/2011  . COLON SURGERY  June 17, 2012   surgery for bleed  . COLONOSCOPY N/A 06/13/2015   Procedure: COLONOSCOPY;  Surgeon: Irene Shipper, MD;  Location: [facility];  Service: Endoscopy;  Laterality: N/A;  . OOPHORECTOMY  1944   not sure which ovary  . POSTERIOR LUMBAR FUSION  01/2009   L4-L5 Archie Endo 04/09/2011  . REDUCTION MAMMAPLASTY Bilateral 1980  . ROTATOR CUFF REPAIR Right   . SHOULDER OPEN ROTATOR CUFF REPAIR  08/04/2012   Procedure: ROTATOR CUFF REPAIR SHOULDER OPEN;  Surgeon: Tobi Bastos, MD;  Location: [facility];  Service: Orthopedics;  Laterality: Left;  with Anchors and Graft  . SHOULDER OPEN ROTATOR CUFF REPAIR Right 2006   Archie Endo 04/22/2011  . TONSILLECTOMY       OB History   No obstetric history on file.      Home Medications    Prior to Admission medications   Medication Sig Start Date End Date Taking? Authorizing Provider  diclofenac sodium (VOLTAREN) 1 % GEL Apply 4 g topically 4 (four) times daily as needed. Patient taking differently: Apply 4 g topically 4 (four) times daily as needed (pain).  01/09/19  Yes Vivi Barrack, MD  guaiFENesin (MUCINEX) 600 MG 12 hr tablet Take 1 tablet (600 mg total) by mouth 2 (two) times daily. 01/14/19  Yes Hosie Poisson, MD  guaiFENesin-dextromethorphan (ROBITUSSIN DM) 100-10 MG/5ML syrup Take 5 mLs by mouth every 4 (four) hours as needed for cough. 01/14/19  Yes Hosie Poisson, MD  ipratropium (ATROVENT) 0.02 % nebulizer solution Take 2.5 mLs (0.5 mg total) by nebulization every 6 (six) hours as needed for wheezing or shortness of breath. 01/14/19  Yes Hosie Poisson, MD  levalbuterol  (XOPENEX) 0.63 MG/3ML nebulizer solution Take 3 mLs (0.63 mg total) by nebulization every 6 (six) hours as needed for wheezing or shortness of breath. 01/14/19  Yes Hosie Poisson, MD  lidocaine (LIDODERM) 5 % PLACE 1 PATCH ONTO AFFECTED AREA(S) EVERY 12 HOURS FOR PAIN RELIEF 12/07/18  Yes Briscoe Deutscher, DO  pantoprazole (PROTONIX) 40 MG tablet Take 1 tablet (40 mg total) by mouth daily. 08/12/18  Yes Briscoe Deutscher, DO  polyvinyl alcohol (LIQUIFILM TEARS) 1.4 % ophthalmic solution Place 1 drop into both eyes as needed for dry eyes. 11/24/17  Yes Florencia Reasons, MD  ropinirole (REQUIP) 5 MG tablet TAKE ONE TABLET BY MOUTH EVERY NIGHT AT BEDTIME Patient taking differently: Take 5 mg by mouth at bedtime.  12/13/18  Yes Briscoe Deutscher, DO  triamcinolone ointment (KENALOG) 0.5 % Apply 1 application topically 2 (two) times daily as needed (irritation,rash). 12/11/17  Yes Briscoe Deutscher, DO  doxycycline (VIBRA-TABS) 100 MG tablet Take 1 tablet (100  mg total) by mouth 2 (two) times daily for 2 days. 01/15/19 01/17/19  Hosie Poisson, MD    Family History Family History  Problem Relation Age of Onset  . Melanoma Daughter        died age 11  . Melanoma Son     Social History Social History   Tobacco Use  . Smoking status: Never Smoker  . Smokeless tobacco: Never Used  Substance Use Topics  . Alcohol use: No  . Drug use: No     Allergies   Sulfa antibiotics and Penicillins   Review of Systems Review of Systems  Unable to perform ROS: Dementia     Physical Exam Updated Vital Signs BP (!) 151/88   Pulse 87   Temp 98 F (36.7 C) (Oral)   Resp (!) 24   SpO2 91%   Physical Exam Vitals signs and nursing note reviewed.    85 year old female, resting comfortably and in no acute distress. Vital signs are significant for elevated respiratory rate and systolic blood pressure. Oxygen saturation is 91%, which is hypoxic. Head is normocephalic and atraumatic. PERRLA, EOMI. Oropharynx is clear. Neck is  nontender and supple without adenopathy or JVD. Back is nontender and there is no CVA tenderness. Lungs have coarse expiratory rhonchi throughout without any definite wheezes or rales i. Chest is nontender. Heart has an irregular rhythm with 2/6 systolic ejection murmur best heard along the left sternal border. Abdomen is soft, flat, nontender without masses or hepatosplenomegaly and peristalsis is normoactive. Extremities have no cyanosis or edema, full range of motion is present. Skin is warm and dry without rash. Neurologic: Awake and alert, oriented to person and place,, cranial nerves are intact, there are no motor or sensory deficits.  ED Treatments / Results  Labs (all labs ordered are listed, but only abnormal results are displayed) Labs Reviewed  BASIC METABOLIC PANEL - Abnormal; Notable for the following components:      Result Value   Glucose, Bld 131 (*)    Creatinine, Ser 1.18 (*)    Calcium 8.7 (*)    GFR calc non Af Amer 39 (*)    GFR calc Af Amer 45 (*)    All other components within normal limits  CBC WITH DIFFERENTIAL/PLATELET - Abnormal; Notable for the following components:   RBC 3.62 (*)    Hemoglobin 10.7 (*)    HCT 35.5 (*)    All other components within normal limits  LACTIC ACID, PLASMA    EKG EKG Interpretation  Date/Time:  Monday January 17 2019 01:56:36 EST Ventricular Rate:  91 PR Interval:    QRS Duration: 128 QT Interval:  412 QTC Calculation: 507 R Axis:   -70 Text Interpretation:  Sinus rhythm Atrial premature complexes RBBB and LAFB Left ventricular hypertrophy When compared with ECG of 01/10/2019, HEART RATE has decreased Confirmed by Delora Fuel ([id number]) on 01/17/2019 2:09:36 AM   Radiology Dg Chest 2 View  Result Date: 01/17/2019 CLINICAL DATA:  Shortness of breath EXAM: CHEST - 2 VIEW COMPARISON:  Seven days ago FINDINGS: Hyperinflation. Cardiomegaly and mitral annular calcification. There is no edema, consolidation, effusion, or  pneumothorax. IMPRESSION: Clearing of right mid lung opacity on prior. No acute finding seen today. Electronically Signed   By: Monte Fantasia M.D.   On: 01/17/2019 04:49    Procedures Procedures   Medications Ordered in ED Medications  dexamethasone (DECADRON) tablet 10 mg (has no administration in time range)  albuterol (PROVENTIL HFA;VENTOLIN HFA) 108 (  90 Base) MCG/ACT inhaler 2 puff (has no administration in time range)  ipratropium-albuterol (DUONEB) 0.5-2.5 (3) MG/3ML nebulizer solution 3 mL (3 mLs Nebulization Given 01/17/19 0317)  ipratropium-albuterol (DUONEB) 0.5-2.5 (3) MG/3ML nebulizer solution 3 mL (3 mLs Nebulization Given 01/17/19 0644)     Initial Impression / Assessment and Plan / ED Course  I have reviewed the triage vital signs and the nursing notes.  Pertinent labs & imaging results that were available during my care of the patient were reviewed by me and considered in my medical decision making (see chart for details).  Worsening dyspnea and hypoxia and patient with recent hospitalization for pneumonia.  Will check chest x-ray to see if any significant changes have occurred.  She is placed on oxygen for comfort.  She does present with DNR paperwork.  Chest x-ray showed no evidence of pneumonia.  Following nebulizer treatment, she still had considerable rhonchi, but oxygen saturation was improving.  Following a second nebulizer treatment, oxygen saturation was up to 95% on room air and there was only minimal rhonchi noted on exam.  Patient stated she felt much better.  She was felt to be safe for discharge.  She was given a dose of dexamethasone here and is discharged with an albuterol inhaler.  Follow-up with PCP.  Return precautions discussed.  Final Clinical Impressions(s) / ED Diagnoses   Final diagnoses:  Acute bronchitis, unspecified organism  Shortness of breath  Renal insufficiency  Normochromic normocytic anemia    ED Discharge Orders    None         Delora Fuel, MD 86/77/37 [phone]

## 2019-01-24 ENCOUNTER — Other Ambulatory Visit: Payer: Self-pay | Admitting: Family Medicine

## 2019-01-27 ENCOUNTER — Telehealth: Payer: Self-pay | Admitting: Family Medicine

## 2019-01-27 NOTE — Telephone Encounter (Unsigned)
Copied from [place] [phone]. Topic: Quick Communication - Rx Refill/Question >> Jan 27, 2019  3:49 PM Percell Belt A wrote: Medication: guaiFENesin (MUCINEX) 600 MG 12 hr tablet [[id number]]- Needs a 15 day supply  lidocaine (LIDODERM) 5 % [[id number]]  [name] called and said that she needs this refills, she also want Dr Juleen China to know she is not doing very well.  She stated that she has really decline  [phone]  Has the patient contacted their pharmacy? No  (Agent: If no, request that the patient contact the pharmacy for the refill.) (Agent: If yes, when and what did the pharmacy advise?)  Preferred Pharmacy (with phone number or street name): [facility] [address]   Agent: Please be advised that RX refills may take up to 3 business days. We ask that you follow-up with your pharmacy.

## 2019-01-27 NOTE — Telephone Encounter (Signed)
See note

## 2019-01-28 ENCOUNTER — Other Ambulatory Visit: Payer: Self-pay

## 2019-01-28 DIAGNOSIS — M1710 Unilateral primary osteoarthritis, unspecified knee: Secondary | ICD-10-CM

## 2019-01-28 DIAGNOSIS — M179 Osteoarthritis of knee, unspecified: Secondary | ICD-10-CM

## 2019-01-28 MED ORDER — GUAIFENESIN ER 600 MG PO TB12: 600.0000 mg | ORAL_TABLET | Freq: Two times a day (BID) | ORAL | 0 refills | Status: DC

## 2019-01-28 MED ORDER — LIDOCAINE 5 % EX PTCH: MEDICATED_PATCH | CUTANEOUS | 0 refills | Status: DC

## 2019-01-28 NOTE — Telephone Encounter (Signed)
I have called in refills

## 2019-02-14 ENCOUNTER — Other Ambulatory Visit: Payer: Self-pay | Admitting: Family Medicine

## 2019-02-14 MED ORDER — GUAIFENESIN ER 600 MG PO TB12: 600.0000 mg | ORAL_TABLET | Freq: Two times a day (BID) | ORAL | 1 refills | Status: DC

## 2019-02-14 NOTE — Telephone Encounter (Signed)
Approved per protocol. Requested Prescriptions  Pending Prescriptions Disp Refills  . guaiFENesin (MUCINEX) 600 MG 12 hr tablet 10 tablet 1    Sig: Take 1 tablet (600 mg total) by mouth 2 (two) times daily.     Over the Counter:  OTC Passed - 02/14/2019  2:30 PM      Passed - Valid encounter within last 12 months    Recent Outpatient Visits          2 months ago Physical deconditioning   [place], [place]   5 months ago Benign essential hypertension   [place], [place]   9 months ago Benign essential hypertension   [place], [place]   1 year ago Reactive depression   [place], [place]   1 year ago Pain of upper abdomen   [place], [place], [place]

## 2019-02-14 NOTE — Telephone Encounter (Signed)
Copied from [place] [phone]. Topic: Quick Communication - Rx Refill/Question >> Feb 14, 2019  2:13 PM Sheran Luz wrote: Medication: guaiFENesin (MUCINEX) 600 MG 12 hr tablet  Renee, with [facility], is requesting refill on behalf of patient. She also inquired if 3 additional refills can be sent in.   Preferred Pharmacy (with phone number or street name):[facility], [place] [phone] (Phone) [phone] (Fax)

## 2019-02-24 ENCOUNTER — Other Ambulatory Visit: Payer: Self-pay | Admitting: Family Medicine

## 2019-03-14 ENCOUNTER — Other Ambulatory Visit: Payer: Self-pay | Admitting: Family Medicine

## 2019-03-14 DIAGNOSIS — M179 Osteoarthritis of knee, unspecified: Secondary | ICD-10-CM

## 2019-03-14 DIAGNOSIS — M1710 Unilateral primary osteoarthritis, unspecified knee: Secondary | ICD-10-CM

## 2019-03-14 MED ORDER — LIDOCAINE 5 % EX PTCH: MEDICATED_PATCH | CUTANEOUS | 0 refills | Status: DC

## 2019-03-14 NOTE — Telephone Encounter (Signed)
See note

## 2019-03-14 NOTE — Telephone Encounter (Signed)
Copied from [place] [phone]. Topic: Quick Communication - Rx Refill/Question >> Mar 14, 2019  2:17 PM Burchel, Abbi R wrote: Medication: lidocaine (LIDODERM) 5 %, MUCINEX 600 MG 12 hr tablet   Preferred Pharmacy: [facility], [place] [phone] (Phone) [phone] (Fax)  Pt was advised that RX refills may take up to 3 business days. We ask that you follow-up with your pharmacy.

## 2019-03-25 ENCOUNTER — Other Ambulatory Visit: Payer: Self-pay | Admitting: Family Medicine

## 2019-03-25 DIAGNOSIS — K219 Gastro-esophageal reflux disease without esophagitis: Secondary | ICD-10-CM

## 2019-03-25 DIAGNOSIS — M1710 Unilateral primary osteoarthritis, unspecified knee: Secondary | ICD-10-CM

## 2019-03-25 DIAGNOSIS — M179 Osteoarthritis of knee, unspecified: Secondary | ICD-10-CM

## 2019-03-25 MED ORDER — LIDOCAINE 5 % EX PTCH: MEDICATED_PATCH | CUTANEOUS | 0 refills | Status: DC

## 2019-03-25 MED ORDER — ROPINIROLE HCL 5 MG PO TABS: 5.0000 mg | ORAL_TABLET | Freq: Every day | ORAL | 0 refills | Status: DC

## 2019-03-25 MED ORDER — PANTOPRAZOLE SODIUM 40 MG PO TBEC: 40.0000 mg | DELAYED_RELEASE_TABLET | Freq: Every day | ORAL | 0 refills | Status: DC

## 2019-03-25 NOTE — Telephone Encounter (Signed)
See note

## 2019-03-25 NOTE — Telephone Encounter (Signed)
Spoke with patient, she is unable to do virtual visit, scheduled telephone visit for this coming Tuesday. Rxs have been refilled.

## 2019-03-25 NOTE — Telephone Encounter (Signed)
Please schedule virtual visit.  Okay to refill.

## 2019-03-25 NOTE — Telephone Encounter (Signed)
Copied from [place] [phone]. Topic: Quick Communication - Rx Refill/Question >> Mar 25, 2019 10:45 AM Leward Quan A wrote: Medication: lidocaine (LIDODERM) 4 %, pantoprazole (PROTONIX) 40 MG tablet, ropinirole (REQUIP) 5 MG tablet  Has the patient contacted their pharmacy? No. (Agent: If no, request that the patient contact the pharmacy for the refill.) (Agent: If yes, when and what did the pharmacy advise?)  Preferred Pharmacy (with phone number or street name): [facility], [place] [phone] (Phone) [phone] (Fax)    Agent: Please be advised that RX refills may take up to 3 business days. We ask that you follow-up with your pharmacy.

## 2019-03-25 NOTE — Telephone Encounter (Signed)
Last OV 12/13/2018 Last refill Lidoderm 03/14/2019 #30/0                 Pantoprazole 08/12/2018 #30/5                 Ropinirole 01/25/2019 #30/1 Next OV not scheduled  Pt was admitted to ED 01/10/19 for pneumonia.  She was seen in the ED 01/17/19 for SOB and bronchitis.   Do you want to schedule telephone visit or OK to refill?

## 2019-03-29 ENCOUNTER — Ambulatory Visit: Payer: Medicare Other | Admitting: Family Medicine

## 2019-06-26 ENCOUNTER — Other Ambulatory Visit: Payer: Self-pay | Admitting: Family Medicine

## 2019-06-26 DIAGNOSIS — M1710 Unilateral primary osteoarthritis, unspecified knee: Secondary | ICD-10-CM

## 2019-06-26 DIAGNOSIS — M179 Osteoarthritis of knee, unspecified: Secondary | ICD-10-CM

## 2019-06-30 ENCOUNTER — Telehealth: Payer: Self-pay | Admitting: Family Medicine

## 2019-06-30 NOTE — Telephone Encounter (Signed)
Renee rn with hospice is requesting a medication for pt to use at night for anxiety. [facility] [address]

## 2019-06-30 NOTE — Telephone Encounter (Signed)
Please see message and advise 

## 2019-07-01 ENCOUNTER — Other Ambulatory Visit: Payer: Self-pay

## 2019-07-01 MED ORDER — LORAZEPAM 0.5 MG PO TABS: 0.5000 mg | ORAL_TABLET | Freq: Every day | ORAL | 0 refills | Status: DC

## 2019-07-01 MED ORDER — CEPHALEXIN 500 MG PO CAPS: 500.0000 mg | ORAL_CAPSULE | Freq: Three times a day (TID) | ORAL | 0 refills | Status: AC

## 2019-07-01 NOTE — Telephone Encounter (Signed)
FYI

## 2019-07-01 NOTE — Telephone Encounter (Signed)
See note

## 2019-07-01 NOTE — Telephone Encounter (Signed)
reviewed with Dr. Juleen China. She has given ok to call in Keflex 500mg  tid x 5 days as well as lorazepam 0.5mg  qhs. I have faxed both to pharmacy requested. Nurse will call family and let them know. Patient does have 24 hour care at this time.

## 2019-07-01 NOTE — Telephone Encounter (Signed)
Renee with [facility] called in regards to patient she called a few days ago to request something for patient to sleep at night. Per Renee patient show higher levels of anxiety at night, also say that patient is showing signs of a possible UTI and they need permission to get something to combat this before the weekend come and it get any worse. Please call Renee at Ph# [phone]

## 2019-07-14 ENCOUNTER — Other Ambulatory Visit: Payer: Self-pay | Admitting: Family Medicine

## 2019-07-14 DIAGNOSIS — M1710 Unilateral primary osteoarthritis, unspecified knee: Secondary | ICD-10-CM

## 2019-07-14 DIAGNOSIS — M179 Osteoarthritis of knee, unspecified: Secondary | ICD-10-CM

## 2019-07-14 DIAGNOSIS — K219 Gastro-esophageal reflux disease without esophagitis: Secondary | ICD-10-CM

## 2019-07-14 NOTE — Telephone Encounter (Signed)
Ok to refill patient has not been seen Last app 09/07/2018

## 2019-07-19 ENCOUNTER — Other Ambulatory Visit: Payer: Self-pay | Admitting: Family Medicine

## 2019-07-19 MED ORDER — LORAZEPAM 0.5 MG PO TABS: 0.5000 mg | ORAL_TABLET | Freq: Every day | ORAL | 0 refills | Status: AC

## 2019-07-19 NOTE — Telephone Encounter (Signed)
Copied from [place] [phone]. Topic: Quick Communication - Rx Refill/Question >> Jul 19, 2019 12:20 PM Leward Quan A wrote: Medication: LORazepam (ATIVAN) 0.5 MG tablet   Has the patient contacted their pharmacy? Yes.   (Agent: If no, request that the patient contact the pharmacy for the refill.) (Agent: If yes, when and what did the pharmacy advise?)  Preferred Pharmacy (with phone number or street name): [facility], [place] [phone] (Phone) [phone] (Fax)    Agent: Please be advised that RX refills may take up to 3 business days. We ask that you follow-up with your pharmacy.

## 2019-07-19 NOTE — Telephone Encounter (Signed)
Copied from [place] [phone]. Topic: General - Other >> Jul 19, 2019 12:29 PM Leward Quan A wrote: Reason for CRM: Rickey Barbara RN of hospice states that she fills the patients pill box and will not have enough to fill the box for next Tuesday. Asking for the Rx to be sent to the pharmacy please LORazepam (ATIVAN) 0.5 MG tablet Renee Ph# [phone]

## 2019-07-19 NOTE — Telephone Encounter (Signed)
Patient is hospice patient and is due for refill on 8/23 just wanted to make sure that it is called in for that pick up date so that family can get in time for nurse to fill pill boxes.

## 2019-08-01 ENCOUNTER — Telehealth: Payer: Self-pay | Admitting: Physical Therapy

## 2019-08-01 NOTE — Telephone Encounter (Signed)
Left message to return call to our office.  

## 2019-08-01 NOTE — Telephone Encounter (Signed)
Copied from [place]. Topic: General - Other >> Aug 01, 2019  9:19 AM Leward Quan A wrote: Reason for CRM: [name] from patients residence called to say that patient will be moving to a nursing home and need to have a Covid and TB test done but need orders from her PCP asking for the orders from Dr Juleen China so she can do this today please. Ph#  [phone]

## 2019-08-03 NOTE — Telephone Encounter (Signed)
Orders received via fas and have sent back

## 2019-09-08 DEATH — deceased
